# Patient Record
Sex: Male | Born: 1940
Health system: Southern US, Community
[De-identification: ages and names within clinical notes are randomized; demographics above are authoritative.]

## PROBLEM LIST (undated history)

## (undated) DIAGNOSIS — C801 Malignant (primary) neoplasm, unspecified: Secondary | ICD-10-CM

## (undated) DIAGNOSIS — D649 Anemia, unspecified: Secondary | ICD-10-CM

## (undated) DIAGNOSIS — N189 Chronic kidney disease, unspecified: Secondary | ICD-10-CM

## (undated) DIAGNOSIS — I509 Heart failure, unspecified: Secondary | ICD-10-CM

## (undated) DIAGNOSIS — J189 Pneumonia, unspecified organism: Secondary | ICD-10-CM

## (undated) DIAGNOSIS — R011 Cardiac murmur, unspecified: Secondary | ICD-10-CM

## (undated) DIAGNOSIS — C7A00 Malignant carcinoid tumor of unspecified site: Secondary | ICD-10-CM

## (undated) DIAGNOSIS — E785 Hyperlipidemia, unspecified: Secondary | ICD-10-CM

## (undated) DIAGNOSIS — I10 Essential (primary) hypertension: Secondary | ICD-10-CM

## (undated) DIAGNOSIS — R609 Edema, unspecified: Secondary | ICD-10-CM

## (undated) HISTORY — DX: Malignant (primary) neoplasm, unspecified: C80.1

## (undated) HISTORY — DX: Hyperlipidemia, unspecified: E78.5

## (undated) HISTORY — DX: Essential (primary) hypertension: I10

## (undated) HISTORY — DX: Chronic kidney disease, unspecified: N18.9

## (undated) HISTORY — DX: Anemia, unspecified: D64.9

## (undated) HISTORY — DX: Edema, unspecified: R60.9

## (undated) HISTORY — DX: Heart failure, unspecified: I50.9

## (undated) HISTORY — DX: Malignant carcinoid tumor of unspecified site: C7A.00

---

## 2008-07-17 HISTORY — PX: NEPHRECTOMY: SHX65

## 2009-02-08 ENCOUNTER — Ambulatory Visit: Payer: Self-pay | Admitting: Cardiology

## 2011-07-21 DIAGNOSIS — C7A093 Malignant carcinoid tumor of the kidney: Secondary | ICD-10-CM | POA: Diagnosis not present

## 2011-07-21 DIAGNOSIS — R972 Elevated prostate specific antigen [PSA]: Secondary | ICD-10-CM | POA: Diagnosis not present

## 2011-07-21 DIAGNOSIS — Z5111 Encounter for antineoplastic chemotherapy: Secondary | ICD-10-CM | POA: Diagnosis not present

## 2011-08-21 DIAGNOSIS — C7A093 Malignant carcinoid tumor of the kidney: Secondary | ICD-10-CM | POA: Diagnosis not present

## 2011-08-21 DIAGNOSIS — Z5111 Encounter for antineoplastic chemotherapy: Secondary | ICD-10-CM | POA: Diagnosis not present

## 2011-09-05 DIAGNOSIS — I10 Essential (primary) hypertension: Secondary | ICD-10-CM | POA: Diagnosis not present

## 2011-09-05 DIAGNOSIS — E782 Mixed hyperlipidemia: Secondary | ICD-10-CM | POA: Diagnosis not present

## 2011-09-12 DIAGNOSIS — R498 Other voice and resonance disorders: Secondary | ICD-10-CM | POA: Diagnosis not present

## 2011-09-12 DIAGNOSIS — C320 Malignant neoplasm of glottis: Secondary | ICD-10-CM | POA: Diagnosis not present

## 2011-10-06 DIAGNOSIS — H353190 Nonexudative age-related macular degeneration, unspecified eye, stage unspecified: Secondary | ICD-10-CM | POA: Diagnosis not present

## 2011-11-30 DIAGNOSIS — R498 Other voice and resonance disorders: Secondary | ICD-10-CM | POA: Diagnosis not present

## 2011-11-30 DIAGNOSIS — C320 Malignant neoplasm of glottis: Secondary | ICD-10-CM | POA: Diagnosis not present

## 2011-12-14 ENCOUNTER — Encounter: Payer: Managed Care, Other (non HMO) | Admitting: Internal Medicine

## 2011-12-14 DIAGNOSIS — C170 Malignant neoplasm of duodenum: Secondary | ICD-10-CM

## 2012-01-11 ENCOUNTER — Encounter: Payer: Managed Care, Other (non HMO) | Admitting: Hematology and Oncology

## 2012-01-11 DIAGNOSIS — C189 Malignant neoplasm of colon, unspecified: Secondary | ICD-10-CM | POA: Diagnosis not present

## 2012-01-25 DIAGNOSIS — R498 Other voice and resonance disorders: Secondary | ICD-10-CM | POA: Diagnosis not present

## 2012-01-25 DIAGNOSIS — C320 Malignant neoplasm of glottis: Secondary | ICD-10-CM | POA: Diagnosis not present

## 2012-02-08 ENCOUNTER — Encounter: Payer: Managed Care, Other (non HMO) | Admitting: Internal Medicine

## 2012-02-08 DIAGNOSIS — C7A098 Malignant carcinoid tumors of other sites: Secondary | ICD-10-CM | POA: Diagnosis not present

## 2012-03-07 ENCOUNTER — Encounter: Payer: Managed Care, Other (non HMO) | Admitting: Internal Medicine

## 2012-03-07 DIAGNOSIS — C787 Secondary malignant neoplasm of liver and intrahepatic bile duct: Secondary | ICD-10-CM | POA: Diagnosis not present

## 2012-03-07 DIAGNOSIS — C7A010 Malignant carcinoid tumor of the duodenum: Secondary | ICD-10-CM | POA: Diagnosis not present

## 2012-04-10 DIAGNOSIS — N183 Chronic kidney disease, stage 3 unspecified: Secondary | ICD-10-CM | POA: Diagnosis not present

## 2012-04-10 DIAGNOSIS — C787 Secondary malignant neoplasm of liver and intrahepatic bile duct: Secondary | ICD-10-CM | POA: Diagnosis not present

## 2012-04-10 DIAGNOSIS — C649 Malignant neoplasm of unspecified kidney, except renal pelvis: Secondary | ICD-10-CM | POA: Diagnosis not present

## 2012-04-11 ENCOUNTER — Encounter: Payer: Managed Care, Other (non HMO) | Admitting: Internal Medicine

## 2012-04-11 DIAGNOSIS — C787 Secondary malignant neoplasm of liver and intrahepatic bile duct: Secondary | ICD-10-CM

## 2012-04-11 DIAGNOSIS — N289 Disorder of kidney and ureter, unspecified: Secondary | ICD-10-CM

## 2012-04-11 DIAGNOSIS — C7A010 Malignant carcinoid tumor of the duodenum: Secondary | ICD-10-CM

## 2012-04-11 DIAGNOSIS — C649 Malignant neoplasm of unspecified kidney, except renal pelvis: Secondary | ICD-10-CM

## 2012-04-30 DIAGNOSIS — C320 Malignant neoplasm of glottis: Secondary | ICD-10-CM | POA: Diagnosis not present

## 2012-04-30 DIAGNOSIS — R498 Other voice and resonance disorders: Secondary | ICD-10-CM | POA: Diagnosis not present

## 2012-05-09 DIAGNOSIS — C7A010 Malignant carcinoid tumor of the duodenum: Secondary | ICD-10-CM

## 2012-05-09 DIAGNOSIS — C787 Secondary malignant neoplasm of liver and intrahepatic bile duct: Secondary | ICD-10-CM | POA: Diagnosis not present

## 2012-06-06 DIAGNOSIS — C787 Secondary malignant neoplasm of liver and intrahepatic bile duct: Secondary | ICD-10-CM

## 2012-06-06 DIAGNOSIS — C7A010 Malignant carcinoid tumor of the duodenum: Secondary | ICD-10-CM | POA: Diagnosis not present

## 2012-07-03 DIAGNOSIS — Z85528 Personal history of other malignant neoplasm of kidney: Secondary | ICD-10-CM | POA: Diagnosis not present

## 2012-07-03 DIAGNOSIS — C7A00 Malignant carcinoid tumor of unspecified site: Secondary | ICD-10-CM | POA: Diagnosis not present

## 2012-07-03 DIAGNOSIS — C649 Malignant neoplasm of unspecified kidney, except renal pelvis: Secondary | ICD-10-CM | POA: Diagnosis not present

## 2012-07-03 DIAGNOSIS — N281 Cyst of kidney, acquired: Secondary | ICD-10-CM | POA: Diagnosis not present

## 2012-07-04 DIAGNOSIS — C649 Malignant neoplasm of unspecified kidney, except renal pelvis: Secondary | ICD-10-CM | POA: Diagnosis not present

## 2012-07-04 DIAGNOSIS — I10 Essential (primary) hypertension: Secondary | ICD-10-CM | POA: Diagnosis not present

## 2012-07-04 DIAGNOSIS — C7A019 Malignant carcinoid tumor of the small intestine, unspecified portion: Secondary | ICD-10-CM | POA: Diagnosis not present

## 2012-07-04 DIAGNOSIS — C787 Secondary malignant neoplasm of liver and intrahepatic bile duct: Secondary | ICD-10-CM | POA: Diagnosis not present

## 2012-07-26 DIAGNOSIS — R498 Other voice and resonance disorders: Secondary | ICD-10-CM | POA: Diagnosis not present

## 2012-07-26 DIAGNOSIS — C320 Malignant neoplasm of glottis: Secondary | ICD-10-CM | POA: Diagnosis not present

## 2012-08-08 DIAGNOSIS — E340 Carcinoid syndrome: Secondary | ICD-10-CM

## 2012-08-08 DIAGNOSIS — C7A010 Malignant carcinoid tumor of the duodenum: Secondary | ICD-10-CM | POA: Diagnosis not present

## 2012-08-08 DIAGNOSIS — C787 Secondary malignant neoplasm of liver and intrahepatic bile duct: Secondary | ICD-10-CM | POA: Diagnosis not present

## 2012-09-05 DIAGNOSIS — C170 Malignant neoplasm of duodenum: Secondary | ICD-10-CM | POA: Diagnosis not present

## 2012-09-05 DIAGNOSIS — E340 Carcinoid syndrome: Secondary | ICD-10-CM | POA: Diagnosis not present

## 2012-09-05 DIAGNOSIS — I10 Essential (primary) hypertension: Secondary | ICD-10-CM | POA: Diagnosis not present

## 2012-09-27 DIAGNOSIS — C7A019 Malignant carcinoid tumor of the small intestine, unspecified portion: Secondary | ICD-10-CM | POA: Diagnosis not present

## 2012-09-27 DIAGNOSIS — C787 Secondary malignant neoplasm of liver and intrahepatic bile duct: Secondary | ICD-10-CM | POA: Diagnosis not present

## 2012-09-27 DIAGNOSIS — I10 Essential (primary) hypertension: Secondary | ICD-10-CM | POA: Diagnosis not present

## 2012-09-27 DIAGNOSIS — C649 Malignant neoplasm of unspecified kidney, except renal pelvis: Secondary | ICD-10-CM | POA: Diagnosis not present

## 2012-09-27 DIAGNOSIS — Z905 Acquired absence of kidney: Secondary | ICD-10-CM | POA: Diagnosis not present

## 2012-09-27 DIAGNOSIS — E785 Hyperlipidemia, unspecified: Secondary | ICD-10-CM | POA: Diagnosis not present

## 2012-10-03 DIAGNOSIS — C787 Secondary malignant neoplasm of liver and intrahepatic bile duct: Secondary | ICD-10-CM | POA: Diagnosis not present

## 2012-10-03 DIAGNOSIS — C7A019 Malignant carcinoid tumor of the small intestine, unspecified portion: Secondary | ICD-10-CM | POA: Diagnosis not present

## 2012-10-03 DIAGNOSIS — E3400 Carcinoid syndrome, unspecified: Secondary | ICD-10-CM

## 2012-10-25 DIAGNOSIS — R498 Other voice and resonance disorders: Secondary | ICD-10-CM | POA: Diagnosis not present

## 2012-10-25 DIAGNOSIS — C320 Malignant neoplasm of glottis: Secondary | ICD-10-CM | POA: Diagnosis not present

## 2012-10-31 DIAGNOSIS — R197 Diarrhea, unspecified: Secondary | ICD-10-CM

## 2012-10-31 DIAGNOSIS — C170 Malignant neoplasm of duodenum: Secondary | ICD-10-CM

## 2012-11-28 DIAGNOSIS — C170 Malignant neoplasm of duodenum: Secondary | ICD-10-CM

## 2012-12-19 DIAGNOSIS — Z905 Acquired absence of kidney: Secondary | ICD-10-CM | POA: Diagnosis not present

## 2012-12-19 DIAGNOSIS — E785 Hyperlipidemia, unspecified: Secondary | ICD-10-CM | POA: Diagnosis not present

## 2012-12-19 DIAGNOSIS — C787 Secondary malignant neoplasm of liver and intrahepatic bile duct: Secondary | ICD-10-CM | POA: Diagnosis not present

## 2012-12-19 DIAGNOSIS — C7A019 Malignant carcinoid tumor of the small intestine, unspecified portion: Secondary | ICD-10-CM | POA: Diagnosis not present

## 2012-12-19 DIAGNOSIS — I10 Essential (primary) hypertension: Secondary | ICD-10-CM | POA: Diagnosis not present

## 2012-12-19 DIAGNOSIS — C649 Malignant neoplasm of unspecified kidney, except renal pelvis: Secondary | ICD-10-CM | POA: Diagnosis not present

## 2012-12-20 DIAGNOSIS — I10 Essential (primary) hypertension: Secondary | ICD-10-CM | POA: Diagnosis not present

## 2012-12-20 DIAGNOSIS — E785 Hyperlipidemia, unspecified: Secondary | ICD-10-CM | POA: Diagnosis not present

## 2012-12-20 DIAGNOSIS — C649 Malignant neoplasm of unspecified kidney, except renal pelvis: Secondary | ICD-10-CM | POA: Diagnosis not present

## 2012-12-20 DIAGNOSIS — C7A019 Malignant carcinoid tumor of the small intestine, unspecified portion: Secondary | ICD-10-CM | POA: Diagnosis not present

## 2012-12-20 DIAGNOSIS — C787 Secondary malignant neoplasm of liver and intrahepatic bile duct: Secondary | ICD-10-CM | POA: Diagnosis not present

## 2012-12-20 DIAGNOSIS — Z905 Acquired absence of kidney: Secondary | ICD-10-CM | POA: Diagnosis not present

## 2012-12-26 ENCOUNTER — Encounter: Payer: Managed Care, Other (non HMO) | Admitting: Internal Medicine

## 2012-12-26 DIAGNOSIS — C7B8 Other secondary neuroendocrine tumors: Secondary | ICD-10-CM

## 2012-12-26 DIAGNOSIS — C7A019 Malignant carcinoid tumor of the small intestine, unspecified portion: Secondary | ICD-10-CM

## 2012-12-26 DIAGNOSIS — C787 Secondary malignant neoplasm of liver and intrahepatic bile duct: Secondary | ICD-10-CM

## 2012-12-26 DIAGNOSIS — Z85528 Personal history of other malignant neoplasm of kidney: Secondary | ICD-10-CM

## 2013-01-21 DIAGNOSIS — H353190 Nonexudative age-related macular degeneration, unspecified eye, stage unspecified: Secondary | ICD-10-CM | POA: Diagnosis not present

## 2013-01-23 DIAGNOSIS — E340 Carcinoid syndrome: Secondary | ICD-10-CM

## 2013-01-28 DIAGNOSIS — C320 Malignant neoplasm of glottis: Secondary | ICD-10-CM | POA: Diagnosis not present

## 2013-01-28 DIAGNOSIS — R498 Other voice and resonance disorders: Secondary | ICD-10-CM | POA: Diagnosis not present

## 2013-01-31 DIAGNOSIS — R0602 Shortness of breath: Secondary | ICD-10-CM | POA: Diagnosis not present

## 2013-02-05 DIAGNOSIS — R609 Edema, unspecified: Secondary | ICD-10-CM | POA: Diagnosis not present

## 2013-02-05 DIAGNOSIS — R799 Abnormal finding of blood chemistry, unspecified: Secondary | ICD-10-CM | POA: Diagnosis not present

## 2013-02-20 DIAGNOSIS — E340 Carcinoid syndrome: Secondary | ICD-10-CM

## 2013-03-11 DIAGNOSIS — E782 Mixed hyperlipidemia: Secondary | ICD-10-CM | POA: Diagnosis not present

## 2013-03-20 DIAGNOSIS — E340 Carcinoid syndrome: Secondary | ICD-10-CM

## 2013-03-20 DIAGNOSIS — C787 Secondary malignant neoplasm of liver and intrahepatic bile duct: Secondary | ICD-10-CM

## 2013-03-20 DIAGNOSIS — C7A019 Malignant carcinoid tumor of the small intestine, unspecified portion: Secondary | ICD-10-CM

## 2013-04-17 DIAGNOSIS — C7A019 Malignant carcinoid tumor of the small intestine, unspecified portion: Secondary | ICD-10-CM

## 2013-04-17 DIAGNOSIS — C787 Secondary malignant neoplasm of liver and intrahepatic bile duct: Secondary | ICD-10-CM

## 2013-04-17 DIAGNOSIS — C7B8 Other secondary neuroendocrine tumors: Secondary | ICD-10-CM

## 2013-04-17 DIAGNOSIS — E340 Carcinoid syndrome: Secondary | ICD-10-CM

## 2013-04-30 DIAGNOSIS — R609 Edema, unspecified: Secondary | ICD-10-CM | POA: Diagnosis not present

## 2013-04-30 DIAGNOSIS — M79609 Pain in unspecified limb: Secondary | ICD-10-CM | POA: Diagnosis not present

## 2013-04-30 DIAGNOSIS — B351 Tinea unguium: Secondary | ICD-10-CM | POA: Diagnosis not present

## 2013-05-13 DIAGNOSIS — R609 Edema, unspecified: Secondary | ICD-10-CM | POA: Diagnosis not present

## 2013-05-13 DIAGNOSIS — C7A019 Malignant carcinoid tumor of the small intestine, unspecified portion: Secondary | ICD-10-CM

## 2013-05-13 DIAGNOSIS — N289 Disorder of kidney and ureter, unspecified: Secondary | ICD-10-CM | POA: Diagnosis not present

## 2013-05-13 DIAGNOSIS — R972 Elevated prostate specific antigen [PSA]: Secondary | ICD-10-CM | POA: Diagnosis not present

## 2013-05-13 DIAGNOSIS — C787 Secondary malignant neoplasm of liver and intrahepatic bile duct: Secondary | ICD-10-CM

## 2013-05-13 DIAGNOSIS — C760 Malignant neoplasm of head, face and neck: Secondary | ICD-10-CM | POA: Diagnosis not present

## 2013-05-13 DIAGNOSIS — C649 Malignant neoplasm of unspecified kidney, except renal pelvis: Secondary | ICD-10-CM | POA: Diagnosis not present

## 2013-05-15 ENCOUNTER — Other Ambulatory Visit: Payer: Self-pay | Admitting: *Deleted

## 2013-05-15 DIAGNOSIS — C649 Malignant neoplasm of unspecified kidney, except renal pelvis: Secondary | ICD-10-CM | POA: Diagnosis not present

## 2013-05-15 DIAGNOSIS — N289 Disorder of kidney and ureter, unspecified: Secondary | ICD-10-CM | POA: Diagnosis not present

## 2013-05-15 DIAGNOSIS — R972 Elevated prostate specific antigen [PSA]: Secondary | ICD-10-CM | POA: Diagnosis not present

## 2013-05-15 DIAGNOSIS — R609 Edema, unspecified: Secondary | ICD-10-CM | POA: Diagnosis not present

## 2013-05-15 DIAGNOSIS — M7989 Other specified soft tissue disorders: Secondary | ICD-10-CM

## 2013-05-15 DIAGNOSIS — C760 Malignant neoplasm of head, face and neck: Secondary | ICD-10-CM | POA: Diagnosis not present

## 2013-05-22 ENCOUNTER — Encounter: Payer: Self-pay | Admitting: Vascular Surgery

## 2013-05-28 DIAGNOSIS — K449 Diaphragmatic hernia without obstruction or gangrene: Secondary | ICD-10-CM | POA: Insufficient documentation

## 2013-05-28 DIAGNOSIS — I10 Essential (primary) hypertension: Secondary | ICD-10-CM | POA: Insufficient documentation

## 2013-05-28 DIAGNOSIS — C649 Malignant neoplasm of unspecified kidney, except renal pelvis: Secondary | ICD-10-CM | POA: Insufficient documentation

## 2013-05-28 DIAGNOSIS — R195 Other fecal abnormalities: Secondary | ICD-10-CM | POA: Insufficient documentation

## 2013-05-28 DIAGNOSIS — D509 Iron deficiency anemia, unspecified: Secondary | ICD-10-CM | POA: Insufficient documentation

## 2013-05-28 DIAGNOSIS — E7800 Pure hypercholesterolemia, unspecified: Secondary | ICD-10-CM | POA: Insufficient documentation

## 2013-05-28 DIAGNOSIS — C179 Malignant neoplasm of small intestine, unspecified: Secondary | ICD-10-CM | POA: Insufficient documentation

## 2013-05-28 DIAGNOSIS — C760 Malignant neoplasm of head, face and neck: Secondary | ICD-10-CM | POA: Insufficient documentation

## 2013-06-02 DIAGNOSIS — C320 Malignant neoplasm of glottis: Secondary | ICD-10-CM | POA: Diagnosis not present

## 2013-06-02 DIAGNOSIS — R498 Other voice and resonance disorders: Secondary | ICD-10-CM | POA: Diagnosis not present

## 2013-06-03 DIAGNOSIS — Z8589 Personal history of malignant neoplasm of other organs and systems: Secondary | ICD-10-CM

## 2013-06-03 DIAGNOSIS — C170 Malignant neoplasm of duodenum: Secondary | ICD-10-CM

## 2013-06-03 DIAGNOSIS — D3A093 Benign carcinoid tumor of the kidney: Secondary | ICD-10-CM

## 2013-06-03 DIAGNOSIS — R944 Abnormal results of kidney function studies: Secondary | ICD-10-CM

## 2013-06-03 DIAGNOSIS — R972 Elevated prostate specific antigen [PSA]: Secondary | ICD-10-CM

## 2013-06-03 DIAGNOSIS — R609 Edema, unspecified: Secondary | ICD-10-CM

## 2013-06-17 DIAGNOSIS — E7800 Pure hypercholesterolemia, unspecified: Secondary | ICD-10-CM | POA: Diagnosis not present

## 2013-06-17 DIAGNOSIS — J45909 Unspecified asthma, uncomplicated: Secondary | ICD-10-CM | POA: Diagnosis not present

## 2013-06-17 DIAGNOSIS — Z7982 Long term (current) use of aspirin: Secondary | ICD-10-CM | POA: Diagnosis not present

## 2013-06-17 DIAGNOSIS — Z905 Acquired absence of kidney: Secondary | ICD-10-CM | POA: Diagnosis not present

## 2013-06-17 DIAGNOSIS — Z85528 Personal history of other malignant neoplasm of kidney: Secondary | ICD-10-CM | POA: Diagnosis not present

## 2013-06-17 DIAGNOSIS — I10 Essential (primary) hypertension: Secondary | ICD-10-CM | POA: Diagnosis not present

## 2013-06-17 DIAGNOSIS — J159 Unspecified bacterial pneumonia: Secondary | ICD-10-CM | POA: Diagnosis not present

## 2013-06-17 DIAGNOSIS — Z79899 Other long term (current) drug therapy: Secondary | ICD-10-CM | POA: Diagnosis not present

## 2013-06-19 ENCOUNTER — Encounter: Payer: Self-pay | Admitting: Vascular Surgery

## 2013-06-20 ENCOUNTER — Inpatient Hospital Stay (HOSPITAL_COMMUNITY): Admission: RE | Admit: 2013-06-20 | Payer: Managed Care, Other (non HMO) | Source: Ambulatory Visit

## 2013-06-20 ENCOUNTER — Encounter: Payer: Managed Care, Other (non HMO) | Admitting: Vascular Surgery

## 2013-06-23 DIAGNOSIS — Z8589 Personal history of malignant neoplasm of other organs and systems: Secondary | ICD-10-CM

## 2013-06-23 DIAGNOSIS — D3A00 Benign carcinoid tumor of unspecified site: Secondary | ICD-10-CM | POA: Diagnosis not present

## 2013-06-23 DIAGNOSIS — R319 Hematuria, unspecified: Secondary | ICD-10-CM | POA: Diagnosis not present

## 2013-06-23 DIAGNOSIS — C7A00 Malignant carcinoid tumor of unspecified site: Secondary | ICD-10-CM

## 2013-06-23 DIAGNOSIS — I129 Hypertensive chronic kidney disease with stage 1 through stage 4 chronic kidney disease, or unspecified chronic kidney disease: Secondary | ICD-10-CM | POA: Diagnosis not present

## 2013-06-23 DIAGNOSIS — C760 Malignant neoplasm of head, face and neck: Secondary | ICD-10-CM | POA: Diagnosis not present

## 2013-06-23 DIAGNOSIS — N189 Chronic kidney disease, unspecified: Secondary | ICD-10-CM | POA: Diagnosis not present

## 2013-06-23 DIAGNOSIS — C787 Secondary malignant neoplasm of liver and intrahepatic bile duct: Secondary | ICD-10-CM

## 2013-06-23 DIAGNOSIS — C649 Malignant neoplasm of unspecified kidney, except renal pelvis: Secondary | ICD-10-CM | POA: Diagnosis not present

## 2013-06-23 DIAGNOSIS — R972 Elevated prostate specific antigen [PSA]: Secondary | ICD-10-CM | POA: Diagnosis not present

## 2013-06-25 DIAGNOSIS — R972 Elevated prostate specific antigen [PSA]: Secondary | ICD-10-CM | POA: Diagnosis not present

## 2013-06-25 DIAGNOSIS — D3A00 Benign carcinoid tumor of unspecified site: Secondary | ICD-10-CM | POA: Diagnosis not present

## 2013-06-25 DIAGNOSIS — C760 Malignant neoplasm of head, face and neck: Secondary | ICD-10-CM | POA: Diagnosis not present

## 2013-06-25 DIAGNOSIS — N189 Chronic kidney disease, unspecified: Secondary | ICD-10-CM | POA: Diagnosis not present

## 2013-06-25 DIAGNOSIS — C649 Malignant neoplasm of unspecified kidney, except renal pelvis: Secondary | ICD-10-CM | POA: Diagnosis not present

## 2013-06-25 DIAGNOSIS — I129 Hypertensive chronic kidney disease with stage 1 through stage 4 chronic kidney disease, or unspecified chronic kidney disease: Secondary | ICD-10-CM | POA: Diagnosis not present

## 2013-07-08 DIAGNOSIS — Q602 Renal agenesis, unspecified: Secondary | ICD-10-CM | POA: Diagnosis not present

## 2013-07-08 DIAGNOSIS — R972 Elevated prostate specific antigen [PSA]: Secondary | ICD-10-CM | POA: Diagnosis not present

## 2013-08-07 DIAGNOSIS — N184 Chronic kidney disease, stage 4 (severe): Secondary | ICD-10-CM | POA: Diagnosis not present

## 2013-08-07 DIAGNOSIS — D3A00 Benign carcinoid tumor of unspecified site: Secondary | ICD-10-CM | POA: Diagnosis not present

## 2013-08-07 DIAGNOSIS — C649 Malignant neoplasm of unspecified kidney, except renal pelvis: Secondary | ICD-10-CM | POA: Diagnosis not present

## 2013-08-07 DIAGNOSIS — Z85528 Personal history of other malignant neoplasm of kidney: Secondary | ICD-10-CM | POA: Diagnosis not present

## 2013-08-07 DIAGNOSIS — Z8589 Personal history of malignant neoplasm of other organs and systems: Secondary | ICD-10-CM | POA: Diagnosis not present

## 2013-08-07 DIAGNOSIS — C260 Malignant neoplasm of intestinal tract, part unspecified: Secondary | ICD-10-CM | POA: Diagnosis not present

## 2013-08-07 DIAGNOSIS — C179 Malignant neoplasm of small intestine, unspecified: Secondary | ICD-10-CM | POA: Diagnosis not present

## 2013-08-13 DIAGNOSIS — Z131 Encounter for screening for diabetes mellitus: Secondary | ICD-10-CM | POA: Diagnosis not present

## 2013-08-13 DIAGNOSIS — I10 Essential (primary) hypertension: Secondary | ICD-10-CM | POA: Diagnosis not present

## 2013-08-29 DIAGNOSIS — I059 Rheumatic mitral valve disease, unspecified: Secondary | ICD-10-CM | POA: Diagnosis not present

## 2013-08-29 DIAGNOSIS — J90 Pleural effusion, not elsewhere classified: Secondary | ICD-10-CM | POA: Diagnosis not present

## 2013-08-29 DIAGNOSIS — Z905 Acquired absence of kidney: Secondary | ICD-10-CM | POA: Diagnosis not present

## 2013-08-29 DIAGNOSIS — R609 Edema, unspecified: Secondary | ICD-10-CM | POA: Diagnosis not present

## 2013-08-29 DIAGNOSIS — Z79899 Other long term (current) drug therapy: Secondary | ICD-10-CM | POA: Diagnosis not present

## 2013-08-29 DIAGNOSIS — E876 Hypokalemia: Secondary | ICD-10-CM | POA: Diagnosis not present

## 2013-08-29 DIAGNOSIS — C7A00 Malignant carcinoid tumor of unspecified site: Secondary | ICD-10-CM | POA: Diagnosis not present

## 2013-08-29 DIAGNOSIS — N184 Chronic kidney disease, stage 4 (severe): Secondary | ICD-10-CM | POA: Diagnosis not present

## 2013-08-29 DIAGNOSIS — E7800 Pure hypercholesterolemia, unspecified: Secondary | ICD-10-CM | POA: Diagnosis present

## 2013-08-29 DIAGNOSIS — I5031 Acute diastolic (congestive) heart failure: Secondary | ICD-10-CM | POA: Diagnosis not present

## 2013-08-29 DIAGNOSIS — N289 Disorder of kidney and ureter, unspecified: Secondary | ICD-10-CM | POA: Diagnosis not present

## 2013-08-29 DIAGNOSIS — I517 Cardiomegaly: Secondary | ICD-10-CM | POA: Diagnosis not present

## 2013-08-29 DIAGNOSIS — I509 Heart failure, unspecified: Secondary | ICD-10-CM | POA: Diagnosis not present

## 2013-08-29 DIAGNOSIS — Z85828 Personal history of other malignant neoplasm of skin: Secondary | ICD-10-CM | POA: Diagnosis not present

## 2013-08-29 DIAGNOSIS — E785 Hyperlipidemia, unspecified: Secondary | ICD-10-CM | POA: Diagnosis present

## 2013-08-29 DIAGNOSIS — Z85528 Personal history of other malignant neoplasm of kidney: Secondary | ICD-10-CM | POA: Diagnosis not present

## 2013-08-29 DIAGNOSIS — R635 Abnormal weight gain: Secondary | ICD-10-CM | POA: Diagnosis not present

## 2013-08-29 DIAGNOSIS — C649 Malignant neoplasm of unspecified kidney, except renal pelvis: Secondary | ICD-10-CM | POA: Diagnosis not present

## 2013-08-29 DIAGNOSIS — C787 Secondary malignant neoplasm of liver and intrahepatic bile duct: Secondary | ICD-10-CM | POA: Diagnosis not present

## 2013-09-04 DIAGNOSIS — C260 Malignant neoplasm of intestinal tract, part unspecified: Secondary | ICD-10-CM | POA: Diagnosis not present

## 2013-09-08 DIAGNOSIS — I509 Heart failure, unspecified: Secondary | ICD-10-CM | POA: Diagnosis not present

## 2013-09-08 DIAGNOSIS — Z6827 Body mass index (BMI) 27.0-27.9, adult: Secondary | ICD-10-CM | POA: Diagnosis not present

## 2013-09-23 DIAGNOSIS — N289 Disorder of kidney and ureter, unspecified: Secondary | ICD-10-CM | POA: Diagnosis not present

## 2013-09-23 DIAGNOSIS — N281 Cyst of kidney, acquired: Secondary | ICD-10-CM | POA: Diagnosis not present

## 2013-09-23 DIAGNOSIS — Z905 Acquired absence of kidney: Secondary | ICD-10-CM | POA: Diagnosis not present

## 2013-09-23 DIAGNOSIS — J90 Pleural effusion, not elsewhere classified: Secondary | ICD-10-CM | POA: Diagnosis not present

## 2013-10-02 DIAGNOSIS — C649 Malignant neoplasm of unspecified kidney, except renal pelvis: Secondary | ICD-10-CM | POA: Diagnosis not present

## 2013-10-02 DIAGNOSIS — D3A00 Benign carcinoid tumor of unspecified site: Secondary | ICD-10-CM | POA: Diagnosis not present

## 2013-10-02 DIAGNOSIS — C179 Malignant neoplasm of small intestine, unspecified: Secondary | ICD-10-CM | POA: Diagnosis not present

## 2013-10-07 DIAGNOSIS — C320 Malignant neoplasm of glottis: Secondary | ICD-10-CM | POA: Diagnosis not present

## 2013-10-07 DIAGNOSIS — R498 Other voice and resonance disorders: Secondary | ICD-10-CM | POA: Diagnosis not present

## 2013-10-30 DIAGNOSIS — C179 Malignant neoplasm of small intestine, unspecified: Secondary | ICD-10-CM | POA: Diagnosis not present

## 2013-10-30 DIAGNOSIS — C649 Malignant neoplasm of unspecified kidney, except renal pelvis: Secondary | ICD-10-CM | POA: Diagnosis not present

## 2013-11-04 DIAGNOSIS — E872 Acidosis, unspecified: Secondary | ICD-10-CM | POA: Diagnosis not present

## 2013-11-04 DIAGNOSIS — N184 Chronic kidney disease, stage 4 (severe): Secondary | ICD-10-CM | POA: Diagnosis not present

## 2013-11-04 DIAGNOSIS — E559 Vitamin D deficiency, unspecified: Secondary | ICD-10-CM | POA: Diagnosis not present

## 2013-11-04 DIAGNOSIS — R809 Proteinuria, unspecified: Secondary | ICD-10-CM | POA: Diagnosis not present

## 2013-11-04 DIAGNOSIS — E876 Hypokalemia: Secondary | ICD-10-CM | POA: Diagnosis not present

## 2013-11-04 DIAGNOSIS — I509 Heart failure, unspecified: Secondary | ICD-10-CM | POA: Diagnosis not present

## 2013-11-04 DIAGNOSIS — E871 Hypo-osmolality and hyponatremia: Secondary | ICD-10-CM | POA: Diagnosis not present

## 2013-11-27 DIAGNOSIS — C179 Malignant neoplasm of small intestine, unspecified: Secondary | ICD-10-CM | POA: Diagnosis not present

## 2013-11-27 DIAGNOSIS — Z5111 Encounter for antineoplastic chemotherapy: Secondary | ICD-10-CM | POA: Diagnosis not present

## 2013-11-27 DIAGNOSIS — R197 Diarrhea, unspecified: Secondary | ICD-10-CM | POA: Diagnosis not present

## 2013-12-04 DIAGNOSIS — N281 Cyst of kidney, acquired: Secondary | ICD-10-CM | POA: Diagnosis not present

## 2013-12-04 DIAGNOSIS — E559 Vitamin D deficiency, unspecified: Secondary | ICD-10-CM | POA: Diagnosis not present

## 2013-12-04 DIAGNOSIS — R809 Proteinuria, unspecified: Secondary | ICD-10-CM | POA: Diagnosis not present

## 2013-12-04 DIAGNOSIS — N189 Chronic kidney disease, unspecified: Secondary | ICD-10-CM | POA: Diagnosis not present

## 2013-12-04 DIAGNOSIS — Z905 Acquired absence of kidney: Secondary | ICD-10-CM | POA: Diagnosis not present

## 2013-12-04 DIAGNOSIS — D649 Anemia, unspecified: Secondary | ICD-10-CM | POA: Diagnosis not present

## 2013-12-04 DIAGNOSIS — Z79899 Other long term (current) drug therapy: Secondary | ICD-10-CM | POA: Diagnosis not present

## 2013-12-04 DIAGNOSIS — I129 Hypertensive chronic kidney disease with stage 1 through stage 4 chronic kidney disease, or unspecified chronic kidney disease: Secondary | ICD-10-CM | POA: Diagnosis not present

## 2013-12-06 DIAGNOSIS — E876 Hypokalemia: Secondary | ICD-10-CM | POA: Diagnosis not present

## 2013-12-24 DIAGNOSIS — D3A019 Benign carcinoid tumor of the small intestine, unspecified portion: Secondary | ICD-10-CM | POA: Diagnosis not present

## 2013-12-24 DIAGNOSIS — C649 Malignant neoplasm of unspecified kidney, except renal pelvis: Secondary | ICD-10-CM | POA: Diagnosis not present

## 2013-12-24 DIAGNOSIS — Z905 Acquired absence of kidney: Secondary | ICD-10-CM | POA: Diagnosis not present

## 2013-12-24 DIAGNOSIS — C179 Malignant neoplasm of small intestine, unspecified: Secondary | ICD-10-CM | POA: Diagnosis not present

## 2013-12-25 DIAGNOSIS — Z905 Acquired absence of kidney: Secondary | ICD-10-CM | POA: Diagnosis not present

## 2013-12-25 DIAGNOSIS — Z803 Family history of malignant neoplasm of breast: Secondary | ICD-10-CM | POA: Diagnosis not present

## 2013-12-25 DIAGNOSIS — Z85528 Personal history of other malignant neoplasm of kidney: Secondary | ICD-10-CM | POA: Diagnosis not present

## 2013-12-25 DIAGNOSIS — D3A00 Benign carcinoid tumor of unspecified site: Secondary | ICD-10-CM | POA: Diagnosis not present

## 2013-12-25 DIAGNOSIS — E785 Hyperlipidemia, unspecified: Secondary | ICD-10-CM | POA: Diagnosis not present

## 2013-12-25 DIAGNOSIS — I5032 Chronic diastolic (congestive) heart failure: Secondary | ICD-10-CM | POA: Diagnosis not present

## 2013-12-25 DIAGNOSIS — R51 Headache: Secondary | ICD-10-CM | POA: Diagnosis not present

## 2013-12-25 DIAGNOSIS — Z823 Family history of stroke: Secondary | ICD-10-CM | POA: Diagnosis not present

## 2013-12-25 DIAGNOSIS — I120 Hypertensive chronic kidney disease with stage 5 chronic kidney disease or end stage renal disease: Secondary | ICD-10-CM | POA: Diagnosis not present

## 2013-12-25 DIAGNOSIS — Z8589 Personal history of malignant neoplasm of other organs and systems: Secondary | ICD-10-CM | POA: Diagnosis not present

## 2013-12-25 DIAGNOSIS — I509 Heart failure, unspecified: Secondary | ICD-10-CM | POA: Diagnosis not present

## 2013-12-25 DIAGNOSIS — D3A098 Benign carcinoid tumors of other sites: Secondary | ICD-10-CM | POA: Diagnosis not present

## 2013-12-25 DIAGNOSIS — Z79899 Other long term (current) drug therapy: Secondary | ICD-10-CM | POA: Diagnosis not present

## 2013-12-25 DIAGNOSIS — N185 Chronic kidney disease, stage 5: Secondary | ICD-10-CM | POA: Diagnosis not present

## 2013-12-25 DIAGNOSIS — C787 Secondary malignant neoplasm of liver and intrahepatic bile duct: Secondary | ICD-10-CM | POA: Diagnosis not present

## 2013-12-30 DIAGNOSIS — R809 Proteinuria, unspecified: Secondary | ICD-10-CM | POA: Diagnosis not present

## 2013-12-30 DIAGNOSIS — N184 Chronic kidney disease, stage 4 (severe): Secondary | ICD-10-CM | POA: Diagnosis not present

## 2013-12-30 DIAGNOSIS — E559 Vitamin D deficiency, unspecified: Secondary | ICD-10-CM | POA: Diagnosis not present

## 2013-12-30 DIAGNOSIS — I509 Heart failure, unspecified: Secondary | ICD-10-CM | POA: Diagnosis not present

## 2013-12-30 DIAGNOSIS — E876 Hypokalemia: Secondary | ICD-10-CM | POA: Diagnosis not present

## 2013-12-30 DIAGNOSIS — E872 Acidosis, unspecified: Secondary | ICD-10-CM | POA: Diagnosis not present

## 2013-12-30 DIAGNOSIS — E871 Hypo-osmolality and hyponatremia: Secondary | ICD-10-CM | POA: Diagnosis not present

## 2014-01-14 DIAGNOSIS — N189 Chronic kidney disease, unspecified: Secondary | ICD-10-CM | POA: Diagnosis not present

## 2014-01-14 DIAGNOSIS — I129 Hypertensive chronic kidney disease with stage 1 through stage 4 chronic kidney disease, or unspecified chronic kidney disease: Secondary | ICD-10-CM | POA: Diagnosis not present

## 2014-01-14 DIAGNOSIS — Z79899 Other long term (current) drug therapy: Secondary | ICD-10-CM | POA: Diagnosis not present

## 2014-01-21 DIAGNOSIS — D631 Anemia in chronic kidney disease: Secondary | ICD-10-CM | POA: Diagnosis not present

## 2014-01-21 DIAGNOSIS — D511 Vitamin B12 deficiency anemia due to selective vitamin B12 malabsorption with proteinuria: Secondary | ICD-10-CM | POA: Insufficient documentation

## 2014-01-21 DIAGNOSIS — N189 Chronic kidney disease, unspecified: Secondary | ICD-10-CM

## 2014-01-21 DIAGNOSIS — C649 Malignant neoplasm of unspecified kidney, except renal pelvis: Secondary | ICD-10-CM | POA: Diagnosis not present

## 2014-01-21 DIAGNOSIS — R197 Diarrhea, unspecified: Secondary | ICD-10-CM | POA: Diagnosis not present

## 2014-01-21 DIAGNOSIS — Z5111 Encounter for antineoplastic chemotherapy: Secondary | ICD-10-CM | POA: Diagnosis not present

## 2014-01-21 DIAGNOSIS — D3A00 Benign carcinoid tumor of unspecified site: Secondary | ICD-10-CM | POA: Insufficient documentation

## 2014-01-21 DIAGNOSIS — C641 Malignant neoplasm of right kidney, except renal pelvis: Secondary | ICD-10-CM | POA: Insufficient documentation

## 2014-01-21 DIAGNOSIS — D518 Other vitamin B12 deficiency anemias: Secondary | ICD-10-CM | POA: Diagnosis not present

## 2014-01-21 DIAGNOSIS — C179 Malignant neoplasm of small intestine, unspecified: Secondary | ICD-10-CM | POA: Diagnosis not present

## 2014-02-10 DIAGNOSIS — C61 Malignant neoplasm of prostate: Secondary | ICD-10-CM | POA: Diagnosis not present

## 2014-02-19 DIAGNOSIS — C649 Malignant neoplasm of unspecified kidney, except renal pelvis: Secondary | ICD-10-CM | POA: Diagnosis not present

## 2014-02-19 DIAGNOSIS — R197 Diarrhea, unspecified: Secondary | ICD-10-CM | POA: Diagnosis not present

## 2014-02-21 DIAGNOSIS — Z79899 Other long term (current) drug therapy: Secondary | ICD-10-CM | POA: Diagnosis not present

## 2014-02-21 DIAGNOSIS — T6391XA Toxic effect of contact with unspecified venomous animal, accidental (unintentional), initial encounter: Secondary | ICD-10-CM | POA: Diagnosis not present

## 2014-02-21 DIAGNOSIS — I10 Essential (primary) hypertension: Secondary | ICD-10-CM | POA: Diagnosis not present

## 2014-02-21 DIAGNOSIS — Z85528 Personal history of other malignant neoplasm of kidney: Secondary | ICD-10-CM | POA: Diagnosis not present

## 2014-02-21 DIAGNOSIS — E7800 Pure hypercholesterolemia, unspecified: Secondary | ICD-10-CM | POA: Diagnosis not present

## 2014-03-17 DIAGNOSIS — D649 Anemia, unspecified: Secondary | ICD-10-CM | POA: Diagnosis not present

## 2014-03-17 DIAGNOSIS — R809 Proteinuria, unspecified: Secondary | ICD-10-CM | POA: Diagnosis not present

## 2014-03-17 DIAGNOSIS — Z79899 Other long term (current) drug therapy: Secondary | ICD-10-CM | POA: Diagnosis not present

## 2014-03-17 DIAGNOSIS — E559 Vitamin D deficiency, unspecified: Secondary | ICD-10-CM | POA: Diagnosis not present

## 2014-03-17 DIAGNOSIS — I129 Hypertensive chronic kidney disease with stage 1 through stage 4 chronic kidney disease, or unspecified chronic kidney disease: Secondary | ICD-10-CM | POA: Diagnosis not present

## 2014-03-17 DIAGNOSIS — N189 Chronic kidney disease, unspecified: Secondary | ICD-10-CM | POA: Diagnosis not present

## 2014-03-19 DIAGNOSIS — D3A00 Benign carcinoid tumor of unspecified site: Secondary | ICD-10-CM | POA: Diagnosis not present

## 2014-03-19 DIAGNOSIS — R197 Diarrhea, unspecified: Secondary | ICD-10-CM | POA: Diagnosis not present

## 2014-03-19 DIAGNOSIS — C179 Malignant neoplasm of small intestine, unspecified: Secondary | ICD-10-CM | POA: Diagnosis not present

## 2014-03-19 DIAGNOSIS — C649 Malignant neoplasm of unspecified kidney, except renal pelvis: Secondary | ICD-10-CM | POA: Diagnosis not present

## 2014-03-24 DIAGNOSIS — C320 Malignant neoplasm of glottis: Secondary | ICD-10-CM | POA: Diagnosis not present

## 2014-03-24 DIAGNOSIS — R498 Other voice and resonance disorders: Secondary | ICD-10-CM | POA: Diagnosis not present

## 2014-03-31 DIAGNOSIS — E872 Acidosis, unspecified: Secondary | ICD-10-CM | POA: Diagnosis not present

## 2014-03-31 DIAGNOSIS — N184 Chronic kidney disease, stage 4 (severe): Secondary | ICD-10-CM | POA: Diagnosis not present

## 2014-03-31 DIAGNOSIS — E871 Hypo-osmolality and hyponatremia: Secondary | ICD-10-CM | POA: Diagnosis not present

## 2014-03-31 DIAGNOSIS — E559 Vitamin D deficiency, unspecified: Secondary | ICD-10-CM | POA: Diagnosis not present

## 2014-03-31 DIAGNOSIS — I509 Heart failure, unspecified: Secondary | ICD-10-CM | POA: Diagnosis not present

## 2014-03-31 DIAGNOSIS — E876 Hypokalemia: Secondary | ICD-10-CM | POA: Diagnosis not present

## 2014-03-31 DIAGNOSIS — I10 Essential (primary) hypertension: Secondary | ICD-10-CM | POA: Diagnosis not present

## 2014-03-31 DIAGNOSIS — R809 Proteinuria, unspecified: Secondary | ICD-10-CM | POA: Diagnosis not present

## 2014-03-31 DIAGNOSIS — D649 Anemia, unspecified: Secondary | ICD-10-CM | POA: Diagnosis not present

## 2014-04-16 DIAGNOSIS — C61 Malignant neoplasm of prostate: Secondary | ICD-10-CM | POA: Diagnosis not present

## 2014-04-16 DIAGNOSIS — Z79899 Other long term (current) drug therapy: Secondary | ICD-10-CM | POA: Diagnosis not present

## 2014-04-16 DIAGNOSIS — R197 Diarrhea, unspecified: Secondary | ICD-10-CM | POA: Diagnosis not present

## 2014-04-16 DIAGNOSIS — Z510 Encounter for antineoplastic radiation therapy: Secondary | ICD-10-CM | POA: Diagnosis not present

## 2014-04-16 DIAGNOSIS — C801 Malignant (primary) neoplasm, unspecified: Secondary | ICD-10-CM | POA: Diagnosis not present

## 2014-04-16 DIAGNOSIS — Z5111 Encounter for antineoplastic chemotherapy: Secondary | ICD-10-CM | POA: Diagnosis not present

## 2014-04-16 DIAGNOSIS — C179 Malignant neoplasm of small intestine, unspecified: Secondary | ICD-10-CM | POA: Diagnosis not present

## 2014-04-16 DIAGNOSIS — I10 Essential (primary) hypertension: Secondary | ICD-10-CM | POA: Diagnosis not present

## 2014-04-17 DIAGNOSIS — Z79899 Other long term (current) drug therapy: Secondary | ICD-10-CM | POA: Diagnosis not present

## 2014-04-17 DIAGNOSIS — I10 Essential (primary) hypertension: Secondary | ICD-10-CM | POA: Diagnosis not present

## 2014-04-17 DIAGNOSIS — Z510 Encounter for antineoplastic radiation therapy: Secondary | ICD-10-CM | POA: Diagnosis not present

## 2014-04-17 DIAGNOSIS — C61 Malignant neoplasm of prostate: Secondary | ICD-10-CM | POA: Diagnosis not present

## 2014-04-20 DIAGNOSIS — C61 Malignant neoplasm of prostate: Secondary | ICD-10-CM | POA: Diagnosis not present

## 2014-04-20 DIAGNOSIS — Z79899 Other long term (current) drug therapy: Secondary | ICD-10-CM | POA: Diagnosis not present

## 2014-04-20 DIAGNOSIS — Z510 Encounter for antineoplastic radiation therapy: Secondary | ICD-10-CM | POA: Diagnosis not present

## 2014-04-20 DIAGNOSIS — I10 Essential (primary) hypertension: Secondary | ICD-10-CM | POA: Diagnosis not present

## 2014-04-21 DIAGNOSIS — C61 Malignant neoplasm of prostate: Secondary | ICD-10-CM | POA: Diagnosis not present

## 2014-04-21 DIAGNOSIS — I10 Essential (primary) hypertension: Secondary | ICD-10-CM | POA: Diagnosis not present

## 2014-04-21 DIAGNOSIS — Z79899 Other long term (current) drug therapy: Secondary | ICD-10-CM | POA: Diagnosis not present

## 2014-04-21 DIAGNOSIS — Z510 Encounter for antineoplastic radiation therapy: Secondary | ICD-10-CM | POA: Diagnosis not present

## 2014-04-22 DIAGNOSIS — I10 Essential (primary) hypertension: Secondary | ICD-10-CM | POA: Diagnosis not present

## 2014-04-22 DIAGNOSIS — Z510 Encounter for antineoplastic radiation therapy: Secondary | ICD-10-CM | POA: Diagnosis not present

## 2014-04-22 DIAGNOSIS — C61 Malignant neoplasm of prostate: Secondary | ICD-10-CM | POA: Diagnosis not present

## 2014-04-22 DIAGNOSIS — Z79899 Other long term (current) drug therapy: Secondary | ICD-10-CM | POA: Diagnosis not present

## 2014-04-23 DIAGNOSIS — Z510 Encounter for antineoplastic radiation therapy: Secondary | ICD-10-CM | POA: Diagnosis not present

## 2014-04-23 DIAGNOSIS — I10 Essential (primary) hypertension: Secondary | ICD-10-CM | POA: Diagnosis not present

## 2014-04-23 DIAGNOSIS — Z79899 Other long term (current) drug therapy: Secondary | ICD-10-CM | POA: Diagnosis not present

## 2014-04-23 DIAGNOSIS — C61 Malignant neoplasm of prostate: Secondary | ICD-10-CM | POA: Diagnosis not present

## 2014-04-24 DIAGNOSIS — I10 Essential (primary) hypertension: Secondary | ICD-10-CM | POA: Diagnosis not present

## 2014-04-24 DIAGNOSIS — Z510 Encounter for antineoplastic radiation therapy: Secondary | ICD-10-CM | POA: Diagnosis not present

## 2014-04-24 DIAGNOSIS — Z79899 Other long term (current) drug therapy: Secondary | ICD-10-CM | POA: Diagnosis not present

## 2014-04-24 DIAGNOSIS — C61 Malignant neoplasm of prostate: Secondary | ICD-10-CM | POA: Diagnosis not present

## 2014-04-27 DIAGNOSIS — Z79899 Other long term (current) drug therapy: Secondary | ICD-10-CM | POA: Diagnosis not present

## 2014-04-27 DIAGNOSIS — C61 Malignant neoplasm of prostate: Secondary | ICD-10-CM | POA: Diagnosis not present

## 2014-04-27 DIAGNOSIS — Z510 Encounter for antineoplastic radiation therapy: Secondary | ICD-10-CM | POA: Diagnosis not present

## 2014-04-27 DIAGNOSIS — I10 Essential (primary) hypertension: Secondary | ICD-10-CM | POA: Diagnosis not present

## 2014-04-28 DIAGNOSIS — Z510 Encounter for antineoplastic radiation therapy: Secondary | ICD-10-CM | POA: Diagnosis not present

## 2014-04-28 DIAGNOSIS — Z79899 Other long term (current) drug therapy: Secondary | ICD-10-CM | POA: Diagnosis not present

## 2014-04-28 DIAGNOSIS — I10 Essential (primary) hypertension: Secondary | ICD-10-CM | POA: Diagnosis not present

## 2014-04-28 DIAGNOSIS — C61 Malignant neoplasm of prostate: Secondary | ICD-10-CM | POA: Diagnosis not present

## 2014-04-29 DIAGNOSIS — I10 Essential (primary) hypertension: Secondary | ICD-10-CM | POA: Diagnosis not present

## 2014-04-29 DIAGNOSIS — Z79899 Other long term (current) drug therapy: Secondary | ICD-10-CM | POA: Diagnosis not present

## 2014-04-29 DIAGNOSIS — C61 Malignant neoplasm of prostate: Secondary | ICD-10-CM | POA: Diagnosis not present

## 2014-04-29 DIAGNOSIS — Z510 Encounter for antineoplastic radiation therapy: Secondary | ICD-10-CM | POA: Diagnosis not present

## 2014-04-30 DIAGNOSIS — I10 Essential (primary) hypertension: Secondary | ICD-10-CM | POA: Diagnosis not present

## 2014-04-30 DIAGNOSIS — C61 Malignant neoplasm of prostate: Secondary | ICD-10-CM | POA: Diagnosis not present

## 2014-04-30 DIAGNOSIS — Z79899 Other long term (current) drug therapy: Secondary | ICD-10-CM | POA: Diagnosis not present

## 2014-04-30 DIAGNOSIS — Z510 Encounter for antineoplastic radiation therapy: Secondary | ICD-10-CM | POA: Diagnosis not present

## 2014-05-01 DIAGNOSIS — I10 Essential (primary) hypertension: Secondary | ICD-10-CM | POA: Diagnosis not present

## 2014-05-01 DIAGNOSIS — Z510 Encounter for antineoplastic radiation therapy: Secondary | ICD-10-CM | POA: Diagnosis not present

## 2014-05-01 DIAGNOSIS — Z79899 Other long term (current) drug therapy: Secondary | ICD-10-CM | POA: Diagnosis not present

## 2014-05-01 DIAGNOSIS — C61 Malignant neoplasm of prostate: Secondary | ICD-10-CM | POA: Diagnosis not present

## 2014-05-03 DIAGNOSIS — Z510 Encounter for antineoplastic radiation therapy: Secondary | ICD-10-CM | POA: Diagnosis not present

## 2014-05-03 DIAGNOSIS — C61 Malignant neoplasm of prostate: Secondary | ICD-10-CM | POA: Diagnosis not present

## 2014-05-03 DIAGNOSIS — I10 Essential (primary) hypertension: Secondary | ICD-10-CM | POA: Diagnosis not present

## 2014-05-03 DIAGNOSIS — Z79899 Other long term (current) drug therapy: Secondary | ICD-10-CM | POA: Diagnosis not present

## 2014-05-04 DIAGNOSIS — Z79899 Other long term (current) drug therapy: Secondary | ICD-10-CM | POA: Diagnosis not present

## 2014-05-04 DIAGNOSIS — Z510 Encounter for antineoplastic radiation therapy: Secondary | ICD-10-CM | POA: Diagnosis not present

## 2014-05-04 DIAGNOSIS — I10 Essential (primary) hypertension: Secondary | ICD-10-CM | POA: Diagnosis not present

## 2014-05-04 DIAGNOSIS — C61 Malignant neoplasm of prostate: Secondary | ICD-10-CM | POA: Diagnosis not present

## 2014-05-05 DIAGNOSIS — I10 Essential (primary) hypertension: Secondary | ICD-10-CM | POA: Diagnosis not present

## 2014-05-05 DIAGNOSIS — Z79899 Other long term (current) drug therapy: Secondary | ICD-10-CM | POA: Diagnosis not present

## 2014-05-05 DIAGNOSIS — C61 Malignant neoplasm of prostate: Secondary | ICD-10-CM | POA: Diagnosis not present

## 2014-05-05 DIAGNOSIS — Z510 Encounter for antineoplastic radiation therapy: Secondary | ICD-10-CM | POA: Diagnosis not present

## 2014-05-06 DIAGNOSIS — I10 Essential (primary) hypertension: Secondary | ICD-10-CM | POA: Diagnosis not present

## 2014-05-06 DIAGNOSIS — Z79899 Other long term (current) drug therapy: Secondary | ICD-10-CM | POA: Diagnosis not present

## 2014-05-06 DIAGNOSIS — Z510 Encounter for antineoplastic radiation therapy: Secondary | ICD-10-CM | POA: Diagnosis not present

## 2014-05-06 DIAGNOSIS — C61 Malignant neoplasm of prostate: Secondary | ICD-10-CM | POA: Diagnosis not present

## 2014-05-07 DIAGNOSIS — Z510 Encounter for antineoplastic radiation therapy: Secondary | ICD-10-CM | POA: Diagnosis not present

## 2014-05-07 DIAGNOSIS — I10 Essential (primary) hypertension: Secondary | ICD-10-CM | POA: Diagnosis not present

## 2014-05-07 DIAGNOSIS — Z79899 Other long term (current) drug therapy: Secondary | ICD-10-CM | POA: Diagnosis not present

## 2014-05-07 DIAGNOSIS — C61 Malignant neoplasm of prostate: Secondary | ICD-10-CM | POA: Diagnosis not present

## 2014-05-08 DIAGNOSIS — Z510 Encounter for antineoplastic radiation therapy: Secondary | ICD-10-CM | POA: Diagnosis not present

## 2014-05-08 DIAGNOSIS — C61 Malignant neoplasm of prostate: Secondary | ICD-10-CM | POA: Diagnosis not present

## 2014-05-08 DIAGNOSIS — Z79899 Other long term (current) drug therapy: Secondary | ICD-10-CM | POA: Diagnosis not present

## 2014-05-08 DIAGNOSIS — I10 Essential (primary) hypertension: Secondary | ICD-10-CM | POA: Diagnosis not present

## 2014-05-11 DIAGNOSIS — C61 Malignant neoplasm of prostate: Secondary | ICD-10-CM | POA: Diagnosis not present

## 2014-05-11 DIAGNOSIS — Z79899 Other long term (current) drug therapy: Secondary | ICD-10-CM | POA: Diagnosis not present

## 2014-05-11 DIAGNOSIS — Z510 Encounter for antineoplastic radiation therapy: Secondary | ICD-10-CM | POA: Diagnosis not present

## 2014-05-11 DIAGNOSIS — I10 Essential (primary) hypertension: Secondary | ICD-10-CM | POA: Diagnosis not present

## 2014-05-12 DIAGNOSIS — Z79899 Other long term (current) drug therapy: Secondary | ICD-10-CM | POA: Diagnosis not present

## 2014-05-12 DIAGNOSIS — Z510 Encounter for antineoplastic radiation therapy: Secondary | ICD-10-CM | POA: Diagnosis not present

## 2014-05-12 DIAGNOSIS — C61 Malignant neoplasm of prostate: Secondary | ICD-10-CM | POA: Diagnosis not present

## 2014-05-12 DIAGNOSIS — I10 Essential (primary) hypertension: Secondary | ICD-10-CM | POA: Diagnosis not present

## 2014-05-13 DIAGNOSIS — I10 Essential (primary) hypertension: Secondary | ICD-10-CM | POA: Diagnosis not present

## 2014-05-13 DIAGNOSIS — C649 Malignant neoplasm of unspecified kidney, except renal pelvis: Secondary | ICD-10-CM | POA: Diagnosis not present

## 2014-05-13 DIAGNOSIS — D511 Vitamin B12 deficiency anemia due to selective vitamin B12 malabsorption with proteinuria: Secondary | ICD-10-CM | POA: Diagnosis not present

## 2014-05-13 DIAGNOSIS — E538 Deficiency of other specified B group vitamins: Secondary | ICD-10-CM | POA: Diagnosis not present

## 2014-05-13 DIAGNOSIS — D369 Benign neoplasm, unspecified site: Secondary | ICD-10-CM | POA: Diagnosis not present

## 2014-05-13 DIAGNOSIS — C61 Malignant neoplasm of prostate: Secondary | ICD-10-CM | POA: Diagnosis not present

## 2014-05-13 DIAGNOSIS — Z79899 Other long term (current) drug therapy: Secondary | ICD-10-CM | POA: Diagnosis not present

## 2014-05-13 DIAGNOSIS — D3A00 Benign carcinoid tumor of unspecified site: Secondary | ICD-10-CM | POA: Diagnosis not present

## 2014-05-13 DIAGNOSIS — N189 Chronic kidney disease, unspecified: Secondary | ICD-10-CM | POA: Diagnosis not present

## 2014-05-13 DIAGNOSIS — C641 Malignant neoplasm of right kidney, except renal pelvis: Secondary | ICD-10-CM | POA: Diagnosis not present

## 2014-05-13 DIAGNOSIS — C179 Malignant neoplasm of small intestine, unspecified: Secondary | ICD-10-CM | POA: Diagnosis not present

## 2014-05-13 DIAGNOSIS — Z510 Encounter for antineoplastic radiation therapy: Secondary | ICD-10-CM | POA: Diagnosis not present

## 2014-05-14 DIAGNOSIS — C179 Malignant neoplasm of small intestine, unspecified: Secondary | ICD-10-CM | POA: Diagnosis not present

## 2014-05-14 DIAGNOSIS — C61 Malignant neoplasm of prostate: Secondary | ICD-10-CM | POA: Diagnosis not present

## 2014-05-14 DIAGNOSIS — Z79899 Other long term (current) drug therapy: Secondary | ICD-10-CM | POA: Diagnosis not present

## 2014-05-14 DIAGNOSIS — I10 Essential (primary) hypertension: Secondary | ICD-10-CM | POA: Diagnosis not present

## 2014-05-14 DIAGNOSIS — Z510 Encounter for antineoplastic radiation therapy: Secondary | ICD-10-CM | POA: Diagnosis not present

## 2014-05-14 DIAGNOSIS — C641 Malignant neoplasm of right kidney, except renal pelvis: Secondary | ICD-10-CM | POA: Diagnosis not present

## 2014-05-14 DIAGNOSIS — E340 Carcinoid syndrome: Secondary | ICD-10-CM | POA: Diagnosis not present

## 2014-05-15 DIAGNOSIS — I10 Essential (primary) hypertension: Secondary | ICD-10-CM | POA: Diagnosis not present

## 2014-05-15 DIAGNOSIS — C61 Malignant neoplasm of prostate: Secondary | ICD-10-CM | POA: Diagnosis not present

## 2014-05-15 DIAGNOSIS — Z510 Encounter for antineoplastic radiation therapy: Secondary | ICD-10-CM | POA: Diagnosis not present

## 2014-05-15 DIAGNOSIS — Z79899 Other long term (current) drug therapy: Secondary | ICD-10-CM | POA: Diagnosis not present

## 2014-05-16 DIAGNOSIS — Z905 Acquired absence of kidney: Secondary | ICD-10-CM | POA: Diagnosis not present

## 2014-05-16 DIAGNOSIS — I10 Essential (primary) hypertension: Secondary | ICD-10-CM | POA: Diagnosis not present

## 2014-05-16 DIAGNOSIS — E780 Pure hypercholesterolemia: Secondary | ICD-10-CM | POA: Diagnosis not present

## 2014-05-16 DIAGNOSIS — Z85528 Personal history of other malignant neoplasm of kidney: Secondary | ICD-10-CM | POA: Diagnosis not present

## 2014-05-16 DIAGNOSIS — N184 Chronic kidney disease, stage 4 (severe): Secondary | ICD-10-CM | POA: Diagnosis not present

## 2014-05-16 DIAGNOSIS — I129 Hypertensive chronic kidney disease with stage 1 through stage 4 chronic kidney disease, or unspecified chronic kidney disease: Secondary | ICD-10-CM | POA: Diagnosis not present

## 2014-05-16 DIAGNOSIS — Z79899 Other long term (current) drug therapy: Secondary | ICD-10-CM | POA: Diagnosis not present

## 2014-05-16 DIAGNOSIS — C61 Malignant neoplasm of prostate: Secondary | ICD-10-CM | POA: Diagnosis not present

## 2014-05-18 DIAGNOSIS — Z510 Encounter for antineoplastic radiation therapy: Secondary | ICD-10-CM | POA: Diagnosis not present

## 2014-05-18 DIAGNOSIS — C61 Malignant neoplasm of prostate: Secondary | ICD-10-CM | POA: Diagnosis not present

## 2014-05-19 DIAGNOSIS — Z510 Encounter for antineoplastic radiation therapy: Secondary | ICD-10-CM | POA: Diagnosis not present

## 2014-05-19 DIAGNOSIS — C61 Malignant neoplasm of prostate: Secondary | ICD-10-CM | POA: Diagnosis not present

## 2014-05-20 DIAGNOSIS — Z510 Encounter for antineoplastic radiation therapy: Secondary | ICD-10-CM | POA: Diagnosis not present

## 2014-05-20 DIAGNOSIS — C61 Malignant neoplasm of prostate: Secondary | ICD-10-CM | POA: Diagnosis not present

## 2014-05-21 DIAGNOSIS — C61 Malignant neoplasm of prostate: Secondary | ICD-10-CM | POA: Diagnosis not present

## 2014-05-21 DIAGNOSIS — Z510 Encounter for antineoplastic radiation therapy: Secondary | ICD-10-CM | POA: Diagnosis not present

## 2014-05-22 DIAGNOSIS — Z510 Encounter for antineoplastic radiation therapy: Secondary | ICD-10-CM | POA: Diagnosis not present

## 2014-05-22 DIAGNOSIS — C61 Malignant neoplasm of prostate: Secondary | ICD-10-CM | POA: Diagnosis not present

## 2014-05-25 DIAGNOSIS — C61 Malignant neoplasm of prostate: Secondary | ICD-10-CM | POA: Diagnosis not present

## 2014-05-25 DIAGNOSIS — Z510 Encounter for antineoplastic radiation therapy: Secondary | ICD-10-CM | POA: Diagnosis not present

## 2014-05-26 DIAGNOSIS — C61 Malignant neoplasm of prostate: Secondary | ICD-10-CM | POA: Diagnosis not present

## 2014-05-26 DIAGNOSIS — Z510 Encounter for antineoplastic radiation therapy: Secondary | ICD-10-CM | POA: Diagnosis not present

## 2014-05-27 DIAGNOSIS — Z510 Encounter for antineoplastic radiation therapy: Secondary | ICD-10-CM | POA: Diagnosis not present

## 2014-05-27 DIAGNOSIS — C61 Malignant neoplasm of prostate: Secondary | ICD-10-CM | POA: Diagnosis not present

## 2014-05-28 DIAGNOSIS — C61 Malignant neoplasm of prostate: Secondary | ICD-10-CM | POA: Diagnosis not present

## 2014-05-28 DIAGNOSIS — Z510 Encounter for antineoplastic radiation therapy: Secondary | ICD-10-CM | POA: Diagnosis not present

## 2014-05-29 DIAGNOSIS — I10 Essential (primary) hypertension: Secondary | ICD-10-CM | POA: Diagnosis not present

## 2014-05-29 DIAGNOSIS — D649 Anemia, unspecified: Secondary | ICD-10-CM | POA: Diagnosis not present

## 2014-05-29 DIAGNOSIS — E559 Vitamin D deficiency, unspecified: Secondary | ICD-10-CM | POA: Diagnosis not present

## 2014-05-29 DIAGNOSIS — R809 Proteinuria, unspecified: Secondary | ICD-10-CM | POA: Diagnosis not present

## 2014-05-29 DIAGNOSIS — N183 Chronic kidney disease, stage 3 (moderate): Secondary | ICD-10-CM | POA: Diagnosis not present

## 2014-05-29 DIAGNOSIS — Z79899 Other long term (current) drug therapy: Secondary | ICD-10-CM | POA: Diagnosis not present

## 2014-06-01 DIAGNOSIS — C61 Malignant neoplasm of prostate: Secondary | ICD-10-CM | POA: Diagnosis not present

## 2014-06-01 DIAGNOSIS — Z510 Encounter for antineoplastic radiation therapy: Secondary | ICD-10-CM | POA: Diagnosis not present

## 2014-06-02 DIAGNOSIS — I509 Heart failure, unspecified: Secondary | ICD-10-CM | POA: Diagnosis not present

## 2014-06-02 DIAGNOSIS — R809 Proteinuria, unspecified: Secondary | ICD-10-CM | POA: Diagnosis not present

## 2014-06-02 DIAGNOSIS — C61 Malignant neoplasm of prostate: Secondary | ICD-10-CM | POA: Diagnosis not present

## 2014-06-02 DIAGNOSIS — N184 Chronic kidney disease, stage 4 (severe): Secondary | ICD-10-CM | POA: Diagnosis not present

## 2014-06-02 DIAGNOSIS — Z85528 Personal history of other malignant neoplasm of kidney: Secondary | ICD-10-CM | POA: Diagnosis not present

## 2014-06-02 DIAGNOSIS — Z510 Encounter for antineoplastic radiation therapy: Secondary | ICD-10-CM | POA: Diagnosis not present

## 2014-06-02 DIAGNOSIS — D631 Anemia in chronic kidney disease: Secondary | ICD-10-CM | POA: Diagnosis not present

## 2014-06-02 DIAGNOSIS — E876 Hypokalemia: Secondary | ICD-10-CM | POA: Diagnosis not present

## 2014-06-03 DIAGNOSIS — Z510 Encounter for antineoplastic radiation therapy: Secondary | ICD-10-CM | POA: Diagnosis not present

## 2014-06-03 DIAGNOSIS — C61 Malignant neoplasm of prostate: Secondary | ICD-10-CM | POA: Diagnosis not present

## 2014-06-04 DIAGNOSIS — C61 Malignant neoplasm of prostate: Secondary | ICD-10-CM | POA: Diagnosis not present

## 2014-06-04 DIAGNOSIS — Z510 Encounter for antineoplastic radiation therapy: Secondary | ICD-10-CM | POA: Diagnosis not present

## 2014-06-05 DIAGNOSIS — Z510 Encounter for antineoplastic radiation therapy: Secondary | ICD-10-CM | POA: Diagnosis not present

## 2014-06-05 DIAGNOSIS — C61 Malignant neoplasm of prostate: Secondary | ICD-10-CM | POA: Diagnosis not present

## 2014-06-08 DIAGNOSIS — C61 Malignant neoplasm of prostate: Secondary | ICD-10-CM | POA: Diagnosis not present

## 2014-06-08 DIAGNOSIS — Z510 Encounter for antineoplastic radiation therapy: Secondary | ICD-10-CM | POA: Diagnosis not present

## 2014-06-09 DIAGNOSIS — E340 Carcinoid syndrome: Secondary | ICD-10-CM | POA: Diagnosis not present

## 2014-06-09 DIAGNOSIS — Z510 Encounter for antineoplastic radiation therapy: Secondary | ICD-10-CM | POA: Diagnosis not present

## 2014-06-09 DIAGNOSIS — C179 Malignant neoplasm of small intestine, unspecified: Secondary | ICD-10-CM | POA: Diagnosis not present

## 2014-06-09 DIAGNOSIS — C61 Malignant neoplasm of prostate: Secondary | ICD-10-CM | POA: Diagnosis not present

## 2014-06-09 DIAGNOSIS — C641 Malignant neoplasm of right kidney, except renal pelvis: Secondary | ICD-10-CM | POA: Diagnosis not present

## 2014-07-08 DIAGNOSIS — C641 Malignant neoplasm of right kidney, except renal pelvis: Secondary | ICD-10-CM | POA: Diagnosis not present

## 2014-07-08 DIAGNOSIS — C179 Malignant neoplasm of small intestine, unspecified: Secondary | ICD-10-CM | POA: Diagnosis not present

## 2014-07-08 DIAGNOSIS — E340 Carcinoid syndrome: Secondary | ICD-10-CM | POA: Diagnosis not present

## 2014-07-28 DIAGNOSIS — C320 Malignant neoplasm of glottis: Secondary | ICD-10-CM | POA: Diagnosis not present

## 2014-07-28 DIAGNOSIS — C61 Malignant neoplasm of prostate: Secondary | ICD-10-CM | POA: Diagnosis not present

## 2014-07-28 DIAGNOSIS — R499 Unspecified voice and resonance disorder: Secondary | ICD-10-CM | POA: Diagnosis not present

## 2014-08-03 DIAGNOSIS — C61 Malignant neoplasm of prostate: Secondary | ICD-10-CM | POA: Diagnosis not present

## 2014-08-03 DIAGNOSIS — L0231 Cutaneous abscess of buttock: Secondary | ICD-10-CM | POA: Diagnosis not present

## 2014-08-06 DIAGNOSIS — C801 Malignant (primary) neoplasm, unspecified: Secondary | ICD-10-CM | POA: Diagnosis not present

## 2014-08-06 DIAGNOSIS — C641 Malignant neoplasm of right kidney, except renal pelvis: Secondary | ICD-10-CM | POA: Diagnosis not present

## 2014-08-06 DIAGNOSIS — E340 Carcinoid syndrome: Secondary | ICD-10-CM | POA: Diagnosis not present

## 2014-08-06 DIAGNOSIS — C179 Malignant neoplasm of small intestine, unspecified: Secondary | ICD-10-CM | POA: Diagnosis not present

## 2014-08-13 DIAGNOSIS — R809 Proteinuria, unspecified: Secondary | ICD-10-CM | POA: Diagnosis not present

## 2014-08-13 DIAGNOSIS — N183 Chronic kidney disease, stage 3 (moderate): Secondary | ICD-10-CM | POA: Diagnosis not present

## 2014-08-13 DIAGNOSIS — I129 Hypertensive chronic kidney disease with stage 1 through stage 4 chronic kidney disease, or unspecified chronic kidney disease: Secondary | ICD-10-CM | POA: Diagnosis not present

## 2014-08-13 DIAGNOSIS — E559 Vitamin D deficiency, unspecified: Secondary | ICD-10-CM | POA: Diagnosis not present

## 2014-08-13 DIAGNOSIS — Z79899 Other long term (current) drug therapy: Secondary | ICD-10-CM | POA: Diagnosis not present

## 2014-08-13 DIAGNOSIS — D649 Anemia, unspecified: Secondary | ICD-10-CM | POA: Diagnosis not present

## 2014-08-18 DIAGNOSIS — E876 Hypokalemia: Secondary | ICD-10-CM | POA: Diagnosis not present

## 2014-08-18 DIAGNOSIS — E872 Acidosis: Secondary | ICD-10-CM | POA: Diagnosis not present

## 2014-08-18 DIAGNOSIS — I509 Heart failure, unspecified: Secondary | ICD-10-CM | POA: Diagnosis not present

## 2014-08-18 DIAGNOSIS — N184 Chronic kidney disease, stage 4 (severe): Secondary | ICD-10-CM | POA: Diagnosis not present

## 2014-08-18 DIAGNOSIS — D649 Anemia, unspecified: Secondary | ICD-10-CM | POA: Diagnosis not present

## 2014-08-18 DIAGNOSIS — I10 Essential (primary) hypertension: Secondary | ICD-10-CM | POA: Diagnosis not present

## 2014-08-18 DIAGNOSIS — R809 Proteinuria, unspecified: Secondary | ICD-10-CM | POA: Diagnosis not present

## 2014-08-19 DIAGNOSIS — D631 Anemia in chronic kidney disease: Secondary | ICD-10-CM | POA: Insufficient documentation

## 2014-08-19 DIAGNOSIS — C801 Malignant (primary) neoplasm, unspecified: Secondary | ICD-10-CM | POA: Diagnosis not present

## 2014-08-19 DIAGNOSIS — N184 Chronic kidney disease, stage 4 (severe): Secondary | ICD-10-CM

## 2014-08-19 DIAGNOSIS — D509 Iron deficiency anemia, unspecified: Secondary | ICD-10-CM | POA: Insufficient documentation

## 2014-08-19 DIAGNOSIS — C641 Malignant neoplasm of right kidney, except renal pelvis: Secondary | ICD-10-CM | POA: Diagnosis not present

## 2014-08-19 DIAGNOSIS — K9049 Malabsorption due to intolerance, not elsewhere classified: Secondary | ICD-10-CM | POA: Insufficient documentation

## 2014-08-19 DIAGNOSIS — D369 Benign neoplasm, unspecified site: Secondary | ICD-10-CM | POA: Diagnosis not present

## 2014-08-19 DIAGNOSIS — C61 Malignant neoplasm of prostate: Secondary | ICD-10-CM | POA: Diagnosis not present

## 2014-08-19 DIAGNOSIS — C179 Malignant neoplasm of small intestine, unspecified: Secondary | ICD-10-CM | POA: Diagnosis not present

## 2014-08-19 DIAGNOSIS — N289 Disorder of kidney and ureter, unspecified: Secondary | ICD-10-CM | POA: Insufficient documentation

## 2014-09-03 DIAGNOSIS — C179 Malignant neoplasm of small intestine, unspecified: Secondary | ICD-10-CM | POA: Diagnosis not present

## 2014-09-03 DIAGNOSIS — C641 Malignant neoplasm of right kidney, except renal pelvis: Secondary | ICD-10-CM | POA: Diagnosis not present

## 2014-09-03 DIAGNOSIS — C801 Malignant (primary) neoplasm, unspecified: Secondary | ICD-10-CM | POA: Diagnosis not present

## 2014-09-15 DIAGNOSIS — I120 Hypertensive chronic kidney disease with stage 5 chronic kidney disease or end stage renal disease: Secondary | ICD-10-CM | POA: Diagnosis not present

## 2014-09-15 DIAGNOSIS — Z6821 Body mass index (BMI) 21.0-21.9, adult: Secondary | ICD-10-CM | POA: Diagnosis not present

## 2014-09-15 DIAGNOSIS — I10 Essential (primary) hypertension: Secondary | ICD-10-CM | POA: Diagnosis not present

## 2014-09-22 ENCOUNTER — Encounter: Payer: Self-pay | Admitting: Vascular Surgery

## 2014-09-22 ENCOUNTER — Other Ambulatory Visit: Payer: Self-pay | Admitting: *Deleted

## 2014-09-22 DIAGNOSIS — Z01810 Encounter for preprocedural cardiovascular examination: Secondary | ICD-10-CM

## 2014-09-22 DIAGNOSIS — N184 Chronic kidney disease, stage 4 (severe): Secondary | ICD-10-CM

## 2014-09-24 DIAGNOSIS — M79672 Pain in left foot: Secondary | ICD-10-CM | POA: Diagnosis not present

## 2014-09-24 DIAGNOSIS — B351 Tinea unguium: Secondary | ICD-10-CM | POA: Diagnosis not present

## 2014-09-24 DIAGNOSIS — M79671 Pain in right foot: Secondary | ICD-10-CM | POA: Diagnosis not present

## 2014-09-24 DIAGNOSIS — G600 Hereditary motor and sensory neuropathy: Secondary | ICD-10-CM | POA: Diagnosis not present

## 2014-09-28 DIAGNOSIS — Z79899 Other long term (current) drug therapy: Secondary | ICD-10-CM | POA: Diagnosis not present

## 2014-09-28 DIAGNOSIS — N183 Chronic kidney disease, stage 3 (moderate): Secondary | ICD-10-CM | POA: Diagnosis not present

## 2014-09-28 DIAGNOSIS — I129 Hypertensive chronic kidney disease with stage 1 through stage 4 chronic kidney disease, or unspecified chronic kidney disease: Secondary | ICD-10-CM | POA: Diagnosis not present

## 2014-09-28 DIAGNOSIS — D509 Iron deficiency anemia, unspecified: Secondary | ICD-10-CM | POA: Diagnosis not present

## 2014-09-28 DIAGNOSIS — E559 Vitamin D deficiency, unspecified: Secondary | ICD-10-CM | POA: Diagnosis not present

## 2014-09-28 DIAGNOSIS — R809 Proteinuria, unspecified: Secondary | ICD-10-CM | POA: Diagnosis not present

## 2014-09-29 DIAGNOSIS — I10 Essential (primary) hypertension: Secondary | ICD-10-CM | POA: Diagnosis not present

## 2014-09-29 DIAGNOSIS — Z6822 Body mass index (BMI) 22.0-22.9, adult: Secondary | ICD-10-CM | POA: Diagnosis not present

## 2014-09-29 DIAGNOSIS — Z0000 Encounter for general adult medical examination without abnormal findings: Secondary | ICD-10-CM | POA: Diagnosis not present

## 2014-10-01 DIAGNOSIS — D631 Anemia in chronic kidney disease: Secondary | ICD-10-CM | POA: Diagnosis not present

## 2014-10-01 DIAGNOSIS — C801 Malignant (primary) neoplasm, unspecified: Secondary | ICD-10-CM | POA: Diagnosis not present

## 2014-10-01 DIAGNOSIS — C641 Malignant neoplasm of right kidney, except renal pelvis: Secondary | ICD-10-CM | POA: Diagnosis not present

## 2014-10-01 DIAGNOSIS — C179 Malignant neoplasm of small intestine, unspecified: Secondary | ICD-10-CM | POA: Diagnosis not present

## 2014-10-01 DIAGNOSIS — N189 Chronic kidney disease, unspecified: Secondary | ICD-10-CM | POA: Diagnosis not present

## 2014-10-13 ENCOUNTER — Encounter: Payer: Self-pay | Admitting: Vascular Surgery

## 2014-10-13 DIAGNOSIS — R809 Proteinuria, unspecified: Secondary | ICD-10-CM | POA: Diagnosis not present

## 2014-10-13 DIAGNOSIS — E872 Acidosis: Secondary | ICD-10-CM | POA: Diagnosis not present

## 2014-10-13 DIAGNOSIS — D649 Anemia, unspecified: Secondary | ICD-10-CM | POA: Diagnosis not present

## 2014-10-13 DIAGNOSIS — N184 Chronic kidney disease, stage 4 (severe): Secondary | ICD-10-CM | POA: Diagnosis not present

## 2014-10-13 DIAGNOSIS — I10 Essential (primary) hypertension: Secondary | ICD-10-CM | POA: Diagnosis not present

## 2014-10-13 DIAGNOSIS — I509 Heart failure, unspecified: Secondary | ICD-10-CM | POA: Diagnosis not present

## 2014-10-13 DIAGNOSIS — E876 Hypokalemia: Secondary | ICD-10-CM | POA: Diagnosis not present

## 2014-10-14 ENCOUNTER — Ambulatory Visit (HOSPITAL_COMMUNITY)
Admission: RE | Admit: 2014-10-14 | Discharge: 2014-10-14 | Disposition: A | Discharge status: Home / Self Care | Payer: Medicare Other | Source: Ambulatory Visit | Attending: Vascular Surgery | Admitting: Vascular Surgery

## 2014-10-14 ENCOUNTER — Ambulatory Visit (INDEPENDENT_AMBULATORY_CARE_PROVIDER_SITE_OTHER): Payer: Medicare Other | Admitting: Vascular Surgery

## 2014-10-14 ENCOUNTER — Encounter: Payer: Self-pay | Admitting: Vascular Surgery

## 2014-10-14 ENCOUNTER — Ambulatory Visit (INDEPENDENT_AMBULATORY_CARE_PROVIDER_SITE_OTHER)
Admission: RE | Admit: 2014-10-14 | Discharge: 2014-10-14 | Disposition: A | Discharge status: Home / Self Care | Payer: Medicare Other | Source: Ambulatory Visit | Attending: Vascular Surgery | Admitting: Vascular Surgery

## 2014-10-14 ENCOUNTER — Other Ambulatory Visit: Payer: Self-pay

## 2014-10-14 VITALS — BP 191/97 | HR 44 | Ht 77.0 in | Wt 190.0 lb

## 2014-10-14 DIAGNOSIS — N184 Chronic kidney disease, stage 4 (severe): Secondary | ICD-10-CM

## 2014-10-14 DIAGNOSIS — Z01810 Encounter for preprocedural cardiovascular examination: Secondary | ICD-10-CM

## 2014-10-14 NOTE — Progress Notes (Signed)
[facility]  HISTORY AND PHYSICAL   History of Present Illness:  Patient is a 74 y.o. year old male who presents for placement of a permanent hemodialysis access. The patient is right handed .  The patient is not currently on hemodialysis.  The cause of renal failure is thought to be secondary to renal cancer with prior nephrectomy as well as hypertension.  Other chronic medical problems include hyperlipidemia, anemia, congestive failure all of which are currently stable..  Past Medical History  Diagnosis Date  . Hypertension   . Hyperlipidemia   . Edema   . Cancer     right renal   . Chronic kidney disease   . CHF (congestive heart failure)   . Anemia     Past Surgical History  Procedure Laterality Date  . Nephrectomy Right      Social History History  Substance Use Topics  . Smoking status: Never Smoker   . Smokeless tobacco: Never Used  . Alcohol Use: No    Family History Family History  Problem Relation Age of Onset  . Cancer Mother   . Hypertension Father   . Cancer Sister     Allergies  No Known Allergies   Current Outpatient Prescriptions  Medication Sig Dispense Refill  . amLODipine (NORVASC) 10 MG tablet Take 10 mg by mouth daily.    [name] atorvastatin (LIPITOR) 80 MG tablet Take 80 mg by mouth daily.    . bisoprolol-hydrochlorothiazide (ZIAC) 10-6.25 MG per tablet Take 1 tablet by mouth daily.    . cholecalciferol (VITAMIN D) 1000 UNITS tablet Take 1,000 Units by mouth daily.    . furosemide (LASIX) 40 MG tablet Take 40 mg by mouth.    . hydrOXYzine (ATARAX/VISTARIL) 25 MG tablet Take 25 mg by mouth 3 (three) times daily as needed.    . labetalol (NORMODYNE) 300 MG tablet Take 300 mg by mouth 2 (two) times daily.    [name] octreotide (SANDOSTATIN LAR) 30 MG injection Inject 30 mg into the muscle every 28 (twenty-eight) days.    . potassium chloride (KLOR-CON) 20 MEQ packet Take by mouth daily.    . predniSONE (DELTASONE) 10 MG  tablet Take 10 mg by mouth daily with breakfast.    . carvedilol (COREG) 3.125 MG tablet Take 3.125 mg by mouth 2 (two) times daily with a meal.    . lisinopril (PRINIVIL,ZESTRIL) 5 MG tablet Take 5 mg by mouth daily.    . sodium bicarbonate 650 MG tablet Take 650 mg by mouth daily.     No current facility-administered medications for this visit.    ROS:   General:  No weight loss, Fever, chills  HEENT: No recent headaches, no nasal bleeding, no visual changes, no sore throat  Neurologic: No dizziness, blackouts, seizures. No recent symptoms of stroke or mini- stroke. No recent episodes of slurred speech, or temporary blindness.  Cardiac: No recent episodes of chest pain/pressure, no shortness of breath at rest.  No shortness of breath with exertion.  Denies history of atrial fibrillation or irregular heartbeat  Vascular: No history of rest pain in feet.  No history of claudication.  No history of non-healing ulcer, No history of DVT   Pulmonary: No home oxygen, no productive cough, no hemoptysis,  No asthma or wheezing  Musculoskeletal:  [ ]  Arthritis, [ ]  Low back pain,  [ ]  Joint pain  Hematologic:No history of hypercoagulable state.  No history of easy bleeding.  No history of anemia  Gastrointestinal: No hematochezia or melena,  No gastroesophageal reflux, no trouble swallowing  Urinary: [x ] chronic Kidney disease, [ ]  on HD - [ ]  MWF or [ ]  TTHS, [ ]  Burning with urination, [ ]  Frequent urination, [ ]  Difficulty urinating;   Skin: No rashes  Psychological: No history of anxiety,  No history of depression   Physical Examination  Filed Vitals:   10/14/14 1507 10/14/14 1511  BP: 197/93 191/97  Pulse: 48 44  Height: 6\' 5"  (1.956 m)   Weight: 190 lb (86.183 kg)     Body mass index is 22.53 kg/(m^2).  General:  Alert and oriented, no acute distress HEENT: Normal Neck: No bruit or JVD Pulmonary: Clear to auscultation bilaterally Cardiac: Regular Rate and Rhythm  without murmur Gastrointestinal: Soft, non-tender, non-distended, no mass Skin: No rash Extremity Pulses:  2+ radial, brachial pulses bilaterally Musculoskeletal: No deformity or edema  Neurologic: Upper and lower extremity motor 5/5 and symmetric  DATA: Patient had a vein mapping ultrasound performed which I reviewed and interpreted. The cephalic vein in the left upper arm is 2.5-2.7 mm in diameter. Otherwise the cephalic vein is of poor quality bilaterally. The basilic vein is also marginal bilaterally.   ASSESSMENT:   Needs long-term hemodialysis access.   PLAN:  Left brachiocephalic AV fistula scheduled for 10/20/2014. Risks benefits possible complications and procedure details of AV fistula placement were explained the patient today. These include but are not limited to bleeding infection ischemic steal non-maturation of fistula. He understands and agrees to proceed.  Ruta Hinds, MD [facility] [place] Office: [phone] Pager: [phone]

## 2014-10-14 NOTE — Progress Notes (Signed)
Filed Vitals:   10/14/14 1507 10/14/14 1511  BP: 197/93 191/97  Pulse: 48 44  Height: 6\' 5"  (1.956 m)   Weight: 190 lb (86.183 kg)    Body mass index is 22.53 kg/(m^2).

## 2014-10-15 ENCOUNTER — Encounter: Payer: Self-pay | Admitting: Nephrology

## 2014-10-19 ENCOUNTER — Encounter (HOSPITAL_COMMUNITY): Payer: Self-pay | Admitting: *Deleted

## 2014-10-19 MED ORDER — SODIUM CHLORIDE 0.9 % IV SOLN: INTRAVENOUS | Status: DC

## 2014-10-19 MED ORDER — CHLORHEXIDINE GLUCONATE CLOTH 2 % EX PADS: 6.0000 | MEDICATED_PAD | Freq: Once | CUTANEOUS | Status: DC

## 2014-10-19 MED ORDER — DEXTROSE 5 % IV SOLN
1.5000 g | INTRAVENOUS | Status: AC
  Administered 2014-10-20: 1.5 g via INTRAVENOUS
  Filled 2014-10-19: qty 1.5

## 2014-10-20 ENCOUNTER — Encounter (HOSPITAL_COMMUNITY): Payer: Self-pay | Admitting: *Deleted

## 2014-10-20 ENCOUNTER — Ambulatory Visit (HOSPITAL_COMMUNITY): Payer: Medicare Other | Admitting: Anesthesiology

## 2014-10-20 ENCOUNTER — Encounter (HOSPITAL_COMMUNITY)
Admission: RE | Disposition: A | Discharge status: Home / Self Care | Payer: Self-pay | Source: Ambulatory Visit | Attending: Vascular Surgery

## 2014-10-20 ENCOUNTER — Ambulatory Visit (HOSPITAL_COMMUNITY)
Admission: RE | Admit: 2014-10-20 | Discharge: 2014-10-20 | Disposition: A | Discharge status: Home / Self Care | Payer: Medicare Other | Source: Ambulatory Visit | Attending: Vascular Surgery | Admitting: Vascular Surgery

## 2014-10-20 ENCOUNTER — Other Ambulatory Visit: Payer: Medicare Other | Admitting: *Deleted

## 2014-10-20 DIAGNOSIS — I509 Heart failure, unspecified: Secondary | ICD-10-CM | POA: Diagnosis not present

## 2014-10-20 DIAGNOSIS — N186 End stage renal disease: Secondary | ICD-10-CM | POA: Insufficient documentation

## 2014-10-20 DIAGNOSIS — Z905 Acquired absence of kidney: Secondary | ICD-10-CM | POA: Diagnosis not present

## 2014-10-20 DIAGNOSIS — D649 Anemia, unspecified: Secondary | ICD-10-CM | POA: Insufficient documentation

## 2014-10-20 DIAGNOSIS — Z85528 Personal history of other malignant neoplasm of kidney: Secondary | ICD-10-CM | POA: Diagnosis not present

## 2014-10-20 DIAGNOSIS — Z4931 Encounter for adequacy testing for hemodialysis: Secondary | ICD-10-CM

## 2014-10-20 DIAGNOSIS — I120 Hypertensive chronic kidney disease with stage 5 chronic kidney disease or end stage renal disease: Secondary | ICD-10-CM | POA: Diagnosis not present

## 2014-10-20 DIAGNOSIS — E785 Hyperlipidemia, unspecified: Secondary | ICD-10-CM | POA: Diagnosis not present

## 2014-10-20 DIAGNOSIS — N185 Chronic kidney disease, stage 5: Secondary | ICD-10-CM | POA: Diagnosis not present

## 2014-10-20 HISTORY — DX: Cardiac murmur, unspecified: R01.1

## 2014-10-20 HISTORY — PX: AV FISTULA PLACEMENT: SHX1204

## 2014-10-20 LAB — POCT I-STAT 4, (NA,K, GLUC, HGB,HCT)
Glucose, Bld: 117 mg/dL — ABNORMAL HIGH (ref 70–99)
HCT: 22 % — ABNORMAL LOW (ref 39.0–52.0)
Hemoglobin: 7.5 g/dL — ABNORMAL LOW (ref 13.0–17.0)
Potassium: 4.2 mmol/L (ref 3.5–5.1)
SODIUM: 139 mmol/L (ref 135–145)

## 2014-10-20 SURGERY — ARTERIOVENOUS (AV) FISTULA CREATION
Anesthesia: General | Site: Arm Lower | Laterality: Left

## 2014-10-20 MED ORDER — SODIUM CHLORIDE 0.9 % IV SOLN
INTRAVENOUS | Status: DC
  Administered 2014-10-20: 35 mL/h via INTRAVENOUS

## 2014-10-20 MED ORDER — FENTANYL CITRATE 0.05 MG/ML IJ SOLN
INTRAMUSCULAR | Status: DC | PRN
  Administered 2014-10-20 (×2): 50 ug via INTRAVENOUS

## 2014-10-20 MED ORDER — THROMBIN 20000 UNITS EX SOLR
CUTANEOUS | Status: AC
  Filled 2014-10-20: qty 20000

## 2014-10-20 MED ORDER — 0.9 % SODIUM CHLORIDE (POUR BTL) OPTIME
TOPICAL | Status: DC | PRN
  Administered 2014-10-20: 1000 mL

## 2014-10-20 MED ORDER — PROPOFOL 10 MG/ML IV BOLUS
INTRAVENOUS | Status: AC
  Filled 2014-10-20: qty 20

## 2014-10-20 MED ORDER — SODIUM CHLORIDE 0.9 % IV SOLN: INTRAVENOUS | Status: DC

## 2014-10-20 MED ORDER — FENTANYL CITRATE 0.05 MG/ML IJ SOLN: 25.0000 ug | INTRAMUSCULAR | Status: DC | PRN

## 2014-10-20 MED ORDER — MIDAZOLAM HCL 2 MG/2ML IJ SOLN
INTRAMUSCULAR | Status: AC
  Filled 2014-10-20: qty 2

## 2014-10-20 MED ORDER — FENTANYL CITRATE 0.05 MG/ML IJ SOLN
INTRAMUSCULAR | Status: AC
  Filled 2014-10-20: qty 5

## 2014-10-20 MED ORDER — GLYCOPYRROLATE 0.2 MG/ML IJ SOLN
INTRAMUSCULAR | Status: DC | PRN
  Administered 2014-10-20: 0.4 mg via INTRAVENOUS

## 2014-10-20 MED ORDER — ONDANSETRON HCL 4 MG/2ML IJ SOLN
INTRAMUSCULAR | Status: DC | PRN
  Administered 2014-10-20: 4 mg via INTRAVENOUS

## 2014-10-20 MED ORDER — LIDOCAINE HCL (CARDIAC) 20 MG/ML IV SOLN
INTRAVENOUS | Status: AC
  Filled 2014-10-20: qty 5

## 2014-10-20 MED ORDER — PROPOFOL 10 MG/ML IV BOLUS
INTRAVENOUS | Status: DC | PRN
  Administered 2014-10-20: 200 mg via INTRAVENOUS
  Administered 2014-10-20: 70 mg via INTRAVENOUS

## 2014-10-20 MED ORDER — HEPARIN SODIUM (PORCINE) 1000 UNIT/ML IJ SOLN
INTRAMUSCULAR | Status: DC | PRN
  Administered 2014-10-20: 7000 [IU] via INTRAVENOUS

## 2014-10-20 MED ORDER — MIDAZOLAM HCL 5 MG/5ML IJ SOLN
INTRAMUSCULAR | Status: DC | PRN
  Administered 2014-10-20: 1 mg via INTRAVENOUS

## 2014-10-20 MED ORDER — LIDOCAINE HCL (PF) 1 % IJ SOLN
INTRAMUSCULAR | Status: AC
  Filled 2014-10-20: qty 30

## 2014-10-20 MED ORDER — SUCCINYLCHOLINE CHLORIDE 20 MG/ML IJ SOLN
INTRAMUSCULAR | Status: DC | PRN
  Administered 2014-10-20: 100 mg via INTRAVENOUS

## 2014-10-20 MED ORDER — SODIUM CHLORIDE 0.9 % IV SOLN
INTRAVENOUS | Status: DC | PRN
  Administered 2014-10-20: 15:00:00 via INTRAVENOUS

## 2014-10-20 MED ORDER — EPHEDRINE SULFATE 50 MG/ML IJ SOLN
INTRAMUSCULAR | Status: DC | PRN
  Administered 2014-10-20 (×3): 10 mg via INTRAVENOUS

## 2014-10-20 MED ORDER — ONDANSETRON HCL 4 MG/2ML IJ SOLN
INTRAMUSCULAR | Status: AC
  Filled 2014-10-20: qty 2

## 2014-10-20 MED ORDER — DEXTROSE 5 % IV SOLN
10.0000 mg | INTRAVENOUS | Status: DC | PRN
  Administered 2014-10-20: 30 ug/min via INTRAVENOUS

## 2014-10-20 MED ORDER — LIDOCAINE HCL (CARDIAC) 20 MG/ML IV SOLN
INTRAVENOUS | Status: DC | PRN
  Administered 2014-10-20: 60 mg via INTRAVENOUS

## 2014-10-20 MED ORDER — ALBUMIN HUMAN 5 % IV SOLN
INTRAVENOUS | Status: DC | PRN
  Administered 2014-10-20 (×2): via INTRAVENOUS

## 2014-10-20 MED ORDER — SODIUM CHLORIDE 0.9 % IR SOLN
Status: DC | PRN
  Administered 2014-10-20: 500 mL

## 2014-10-20 MED ORDER — OXYCODONE-ACETAMINOPHEN 5-325 MG PO TABS: 1.0000 | ORAL_TABLET | Freq: Four times a day (QID) | ORAL | Status: DC | PRN

## 2014-10-20 SURGICAL SUPPLY — 33 items
ARMBAND PINK RESTRICT EXTREMIT (MISCELLANEOUS) ×2 IMPLANT
CANISTER SUCTION 2500CC (MISCELLANEOUS) ×2 IMPLANT
CANNULA VESSEL 3MM 2 BLNT TIP (CANNULA) ×2 IMPLANT
CLIP TI MEDIUM 6 (CLIP) ×2 IMPLANT
CLIP TI WIDE RED SMALL 6 (CLIP) ×2 IMPLANT
COVER PROBE W GEL 5X96 (DRAPES) IMPLANT
DECANTER SPIKE VIAL GLASS SM (MISCELLANEOUS) ×2 IMPLANT
DRAIN PENROSE 1/4X12 LTX STRL (WOUND CARE) ×2 IMPLANT
ELECT REM PT RETURN 9FT ADLT (ELECTROSURGICAL) ×2
ELECTRODE REM PT RTRN 9FT ADLT (ELECTROSURGICAL) ×1 IMPLANT
GLOVE BIO SURGEON STRL SZ 6.5 (GLOVE) ×4 IMPLANT
GLOVE BIO SURGEON STRL SZ7.5 (GLOVE) ×2 IMPLANT
GLOVE BIOGEL PI IND STRL 6.5 (GLOVE) ×4 IMPLANT
GLOVE BIOGEL PI INDICATOR 6.5 (GLOVE) ×4
GLOVE OPTIFIT SS 6.5 STRL BRWN (GLOVE) ×2 IMPLANT
GOWN BRE IMP SLV AUR XL STRL (GOWN DISPOSABLE) ×2 IMPLANT
GOWN STRL REUS W/ TWL LRG LVL3 (GOWN DISPOSABLE) ×3 IMPLANT
GOWN STRL REUS W/TWL LRG LVL3 (GOWN DISPOSABLE) ×3
KIT BASIN OR (CUSTOM PROCEDURE TRAY) ×2 IMPLANT
KIT ROOM TURNOVER OR (KITS) ×2 IMPLANT
LIQUID BAND (GAUZE/BANDAGES/DRESSINGS) ×2 IMPLANT
LOOP VESSEL MINI RED (MISCELLANEOUS) IMPLANT
NS IRRIG 1000ML POUR BTL (IV SOLUTION) ×2 IMPLANT
PACK CV ACCESS (CUSTOM PROCEDURE TRAY) ×2 IMPLANT
PAD ARMBOARD 7.5X6 YLW CONV (MISCELLANEOUS) ×4 IMPLANT
SPONGE SURGIFOAM ABS GEL 100 (HEMOSTASIS) IMPLANT
SUT PROLENE 6 0 BV (SUTURE) IMPLANT
SUT PROLENE 7 0 BV 1 (SUTURE) ×2 IMPLANT
SUT VIC AB 3-0 SH 27 (SUTURE) ×1
SUT VIC AB 3-0 SH 27X BRD (SUTURE) ×1 IMPLANT
SUT VICRYL 4-0 PS2 18IN ABS (SUTURE) ×2 IMPLANT
UNDERPAD 30X30 INCONTINENT (UNDERPADS AND DIAPERS) ×2 IMPLANT
WATER STERILE IRR 1000ML POUR (IV SOLUTION) ×2 IMPLANT

## 2014-10-20 NOTE — Anesthesia Procedure Notes (Addendum)
Procedure Name: Intubation Date/Time: 10/20/2014 3:09 PM Performed by: Susa Loffler Pre-anesthesia Checklist: Patient identified, Timeout performed, Emergency Drugs available, Suction available and Patient being monitored Patient Re-evaluated:Patient Re-evaluated prior to inductionOxygen Delivery Method: Circle system utilized Preoxygenation: Pre-oxygenation with 100% oxygen Intubation Type: IV induction Ventilation: Mask ventilation without difficulty and Oral airway inserted - appropriate to patient size Laryngoscope Size: Mac and 4 Grade View: Grade I Tube type: Oral Tube size: 7.5 mm Number of attempts: 1 Airway Equipment and Method: Stylet and Oral airway Placement Confirmation: ETT inserted through vocal cords under direct vision,  positive ETCO2 and breath sounds checked- equal and bilateral Secured at: 23 cm Tube secured with: Tape Dental Injury: Teeth and Oropharynx as per pre-operative assessment  Comments: Unable to seat LMA #5. Convert to GETA. VSS throughout.

## 2014-10-20 NOTE — Interval H&P Note (Signed)
History and Physical Interval Note:  10/20/2014 12:12 PM  Allen Davenport  has presented today for surgery, with the diagnosis of End Stage Renal Disease N18.6  The various methods of treatment have been discussed with the patient and family. After consideration of risks, benefits and other options for treatment, the patient has consented to  Procedure(s): ARTERIOVENOUS (AV) FISTULA CREATION (Left) as a surgical intervention .  The patient's history has been reviewed, patient examined, no change in status, stable for surgery.  I have reviewed the patient's chart and labs.  Questions were answered to the patient's satisfaction.     Allen Davenport

## 2014-10-20 NOTE — Op Note (Signed)
Procedure: Left Brachial Cephalic AV fistula  Preop: ESRD  Postop: ESRD  Anesthesia: General  Assistant: Silva Bandy PA-c  Findings: 3.5 mm cephalic vein  Procedure: After obtaining informed consent, the patient was taken to the operating room.  After induction of general anesthesia, the left upper extremity was prepped and draped in usual sterile fashion.  A transverse incision was then made near the antecubital crease the left arm. The incision was carried into the subcutaneous tissues down to level of the cephalic vein. The cephalic vein was approximately 3.5 mm in diameter. It was of good quality. This was dissected free circumferentially and small side branches ligated and divided between silk ties or clips. Next the brachial artery was dissected free in the medial portion of the incision. The artery was  3-4 mm in diameter. The vessel loops were placed proximal and distal to the planned site of arteriotomy. The patient was given 7000 units of intravenous heparin. After appropriate circulation time, the vessel loops were used to control the artery. A longitudinal opening was made in the brachial artery.  The vein was ligated distally with a 2-0 silk tie. The vein was controlled proximally with a fine bulldog clamp. The vein was then swung over to the artery and sewn end of vein to side of artery using a running 7-0 Prolene suture. Just prior to completion of the anastomosis, everything was fore bled back bled and thoroughly flushed. The anastomosis was secured, vessel loops released, and there was a palpable thrill in the fistula immediately. After hemostasis was obtained, the subcutaneous tissues were reapproximated using a running 3-0 Vicryl suture. The skin was then closed with a 4 0 Vicryl subcuticular stitch. Dermabond was applied to the skin incision.  The patient had a palpable radial pulse at the end of the case.  Ruta Hinds, MD [facility] [place] Office:  [phone] Pager: [phone]

## 2014-10-20 NOTE — Anesthesia Postprocedure Evaluation (Signed)
  Anesthesia Post-op Note  Patient: Allen Davenport  Procedure(s) Performed: Procedure(s): Left Arm ARTERIOVENOUS (AV) FISTULA CREATION (Left)  Patient Location: PACU  Anesthesia Type:General  Level of Consciousness: awake  Airway and Oxygen Therapy: Patient Spontanous Breathing  Post-op Pain: mild  Post-op Assessment: Post-op Vital signs reviewed  Post-op Vital Signs: Reviewed  Last Vitals:  Filed Vitals:   10/20/14 1615  BP: 155/76  Pulse: 74  Temp: 36.2 C  Resp: 13    Complications: No apparent anesthesia complications

## 2014-10-20 NOTE — Anesthesia Preprocedure Evaluation (Signed)
Anesthesia Evaluation  Patient identified by MRN, date of birth, ID band Patient awake    Reviewed: Allergy & Precautions, NPO status , Patient's Chart, lab work & pertinent test results  Airway Mallampati: II  TM Distance: >3 FB Neck ROM: Full    Dental   Pulmonary neg pulmonary ROS,  breath sounds clear to auscultation        Cardiovascular hypertension, +CHF + Valvular Problems/Murmurs Rhythm:Regular Rate:Normal     Neuro/Psych    GI/Hepatic negative GI ROS, Neg liver ROS,   Endo/Other    Renal/GU Renal disease     Musculoskeletal   Abdominal   Peds  Hematology   Anesthesia Other Findings   Reproductive/Obstetrics                             Anesthesia Physical Anesthesia Plan  ASA: IV  Anesthesia Plan: General   Post-op Pain Management:    Induction: Intravenous  Airway Management Planned: LMA  Additional Equipment:   Intra-op Plan:   Post-operative Plan:   Informed Consent: I have reviewed the patients History and Physical, chart, labs and discussed the procedure including the risks, benefits and alternatives for the proposed anesthesia with the patient or authorized representative who has indicated his/her understanding and acceptance.   Dental advisory given  Plan Discussed with: CRNA and Anesthesiologist  Anesthesia Plan Comments:         Anesthesia Quick Evaluation

## 2014-10-20 NOTE — H&P (View-Only) (Signed)
[facility] [place] HISTORY AND PHYSICAL   History of Present Illness:  Patient is a 74 y.o. year old male who presents for placement of a permanent hemodialysis access. The patient is right handed .  The patient is not currently on hemodialysis.  The cause of renal failure is thought to be secondary to renal cancer with prior nephrectomy as well as hypertension.  Other chronic medical problems include hyperlipidemia, anemia, congestive failure all of which are currently stable..  Past Medical History  Diagnosis Date  . Hypertension   . Hyperlipidemia   . Edema   . Cancer     right renal   . Chronic kidney disease   . CHF (congestive heart failure)   . Anemia     Past Surgical History  Procedure Laterality Date  . Nephrectomy Right      Social History History  Substance Use Topics  . Smoking status: Never Smoker   . Smokeless tobacco: Never Used  . Alcohol Use: No    Family History Family History  Problem Relation Age of Onset  . Cancer Mother   . Hypertension Father   . Cancer Sister     Allergies  No Known Allergies   Current Outpatient Prescriptions  Medication Sig Dispense Refill  . amLODipine (NORVASC) 10 MG tablet Take 10 mg by mouth daily.    [name] atorvastatin (LIPITOR) 80 MG tablet Take 80 mg by mouth daily.    . bisoprolol-hydrochlorothiazide (ZIAC) 10-6.25 MG per tablet Take 1 tablet by mouth daily.    . cholecalciferol (VITAMIN D) 1000 UNITS tablet Take 1,000 Units by mouth daily.    . furosemide (LASIX) 40 MG tablet Take 40 mg by mouth.    . hydrOXYzine (ATARAX/VISTARIL) 25 MG tablet Take 25 mg by mouth 3 (three) times daily as needed.    . labetalol (NORMODYNE) 300 MG tablet Take 300 mg by mouth 2 (two) times daily.    [name] octreotide (SANDOSTATIN LAR) 30 MG injection Inject 30 mg into the muscle every 28 (twenty-eight) days.    . potassium chloride (KLOR-CON) 20 MEQ packet Take by mouth daily.    . predniSONE (DELTASONE) 10 MG  tablet Take 10 mg by mouth daily with breakfast.    . carvedilol (COREG) 3.125 MG tablet Take 3.125 mg by mouth 2 (two) times daily with a meal.    . lisinopril (PRINIVIL,ZESTRIL) 5 MG tablet Take 5 mg by mouth daily.    . sodium bicarbonate 650 MG tablet Take 650 mg by mouth daily.     No current facility-administered medications for this visit.    ROS:   General:  No weight loss, Fever, chills  HEENT: No recent headaches, no nasal bleeding, no visual changes, no sore throat  Neurologic: No dizziness, blackouts, seizures. No recent symptoms of stroke or mini- stroke. No recent episodes of slurred speech, or temporary blindness.  Cardiac: No recent episodes of chest pain/pressure, no shortness of breath at rest.  No shortness of breath with exertion.  Denies history of atrial fibrillation or irregular heartbeat  Vascular: No history of rest pain in feet.  No history of claudication.  No history of non-healing ulcer, No history of DVT   Pulmonary: No home oxygen, no productive cough, no hemoptysis,  No asthma or wheezing  Musculoskeletal:  [ ]  Arthritis, [ ]  Low back pain,  [ ]  Joint pain  Hematologic:No history of hypercoagulable state.  No history of easy bleeding.  No history of anemia  Gastrointestinal: No hematochezia or melena,  No gastroesophageal reflux, no trouble swallowing  Urinary: [x ] chronic Kidney disease, [ ]  on HD - [ ]  MWF or [ ]  TTHS, [ ]  Burning with urination, [ ]  Frequent urination, [ ]  Difficulty urinating;   Skin: No rashes  Psychological: No history of anxiety,  No history of depression   Physical Examination  Filed Vitals:   10/14/14 1507 10/14/14 1511  BP: 197/93 191/97  Pulse: 48 44  Height: 6\' 5"  (1.956 m)   Weight: 190 lb (86.183 kg)     Body mass index is 22.53 kg/(m^2).  General:  Alert and oriented, no acute distress HEENT: Normal Neck: No bruit or JVD Pulmonary: Clear to auscultation bilaterally Cardiac: Regular Rate and Rhythm  without murmur Gastrointestinal: Soft, non-tender, non-distended, no mass Skin: No rash Extremity Pulses:  2+ radial, brachial pulses bilaterally Musculoskeletal: No deformity or edema  Neurologic: Upper and lower extremity motor 5/5 and symmetric  DATA: Patient had a vein mapping ultrasound performed which I reviewed and interpreted. The cephalic vein in the left upper arm is 2.5-2.7 mm in diameter. Otherwise the cephalic vein is of poor quality bilaterally. The basilic vein is also marginal bilaterally.   ASSESSMENT:   Needs long-term hemodialysis access.   PLAN:  Left brachiocephalic AV fistula scheduled for 10/20/2014. Risks benefits possible complications and procedure details of AV fistula placement were explained the patient today. These include but are not limited to bleeding infection ischemic steal non-maturation of fistula. He understands and agrees to proceed.  Ruta Hinds, MD [facility] [place] Office: [phone] Pager: [phone]

## 2014-10-20 NOTE — Transfer of Care (Signed)
Immediate Anesthesia Transfer of Care Note  Patient: Allen Davenport  Procedure(s) Performed: Procedure(s): Left Arm ARTERIOVENOUS (AV) FISTULA CREATION (Left)  Patient Location: PACU  Anesthesia Type:General  Level of Consciousness: awake, alert , oriented and patient cooperative  Airway & Oxygen Therapy: Patient Spontanous Breathing and Patient connected to nasal cannula oxygen  Post-op Assessment: Report given to RN and Post -op Vital signs reviewed and stable  Post vital signs: Reviewed and stable  Last Vitals:  Filed Vitals:   10/20/14 1131  BP: 193/94  Pulse: 44  Temp: 36.4 C  Resp: 20    Complications: No apparent anesthesia complications

## 2014-10-21 ENCOUNTER — Telehealth: Payer: Self-pay | Admitting: Vascular Surgery

## 2014-10-21 ENCOUNTER — Encounter (HOSPITAL_COMMUNITY): Payer: Self-pay | Admitting: Vascular Surgery

## 2014-10-21 NOTE — Telephone Encounter (Signed)
-----   Message from Mena Goes, RN sent at 10/20/2014  4:10 PM EDT ----- Regarding: Schedule   ----- Message -----    From: Alvia Grove, PA-C    Sent: 10/20/2014   3:52 PM      To: [facility] Charge Pool  S/p left brachial-cephalic AV fistula [id number]  F/u with Dr. Oneida Alar in 4 weeks with duplex  Thanks Maudie Mercury

## 2014-10-21 NOTE — Telephone Encounter (Signed)
Spoke with patient to schedule appointment, dpm °

## 2014-10-22 DIAGNOSIS — N184 Chronic kidney disease, stage 4 (severe): Secondary | ICD-10-CM | POA: Diagnosis not present

## 2014-10-26 DIAGNOSIS — M810 Age-related osteoporosis without current pathological fracture: Secondary | ICD-10-CM | POA: Diagnosis not present

## 2014-10-26 DIAGNOSIS — Z79899 Other long term (current) drug therapy: Secondary | ICD-10-CM | POA: Diagnosis not present

## 2014-10-26 DIAGNOSIS — C649 Malignant neoplasm of unspecified kidney, except renal pelvis: Secondary | ICD-10-CM | POA: Diagnosis not present

## 2014-10-26 DIAGNOSIS — M85851 Other specified disorders of bone density and structure, right thigh: Secondary | ICD-10-CM | POA: Diagnosis not present

## 2014-10-26 DIAGNOSIS — E780 Pure hypercholesterolemia: Secondary | ICD-10-CM | POA: Diagnosis not present

## 2014-10-26 DIAGNOSIS — I10 Essential (primary) hypertension: Secondary | ICD-10-CM | POA: Diagnosis not present

## 2014-10-29 DIAGNOSIS — C801 Malignant (primary) neoplasm, unspecified: Secondary | ICD-10-CM | POA: Diagnosis not present

## 2014-10-29 DIAGNOSIS — C179 Malignant neoplasm of small intestine, unspecified: Secondary | ICD-10-CM | POA: Diagnosis not present

## 2014-10-29 DIAGNOSIS — N189 Chronic kidney disease, unspecified: Secondary | ICD-10-CM | POA: Diagnosis not present

## 2014-10-29 DIAGNOSIS — D631 Anemia in chronic kidney disease: Secondary | ICD-10-CM | POA: Diagnosis not present

## 2014-10-29 DIAGNOSIS — N184 Chronic kidney disease, stage 4 (severe): Secondary | ICD-10-CM | POA: Diagnosis not present

## 2014-10-29 DIAGNOSIS — C641 Malignant neoplasm of right kidney, except renal pelvis: Secondary | ICD-10-CM | POA: Diagnosis not present

## 2014-11-05 DIAGNOSIS — N184 Chronic kidney disease, stage 4 (severe): Secondary | ICD-10-CM | POA: Diagnosis not present

## 2014-11-12 DIAGNOSIS — N184 Chronic kidney disease, stage 4 (severe): Secondary | ICD-10-CM | POA: Diagnosis not present

## 2014-11-19 DIAGNOSIS — N184 Chronic kidney disease, stage 4 (severe): Secondary | ICD-10-CM | POA: Diagnosis not present

## 2014-11-19 DIAGNOSIS — R490 Dysphonia: Secondary | ICD-10-CM | POA: Diagnosis not present

## 2014-11-19 DIAGNOSIS — C320 Malignant neoplasm of glottis: Secondary | ICD-10-CM | POA: Diagnosis not present

## 2014-11-25 ENCOUNTER — Encounter: Payer: Self-pay | Admitting: Vascular Surgery

## 2014-11-26 ENCOUNTER — Encounter: Payer: Self-pay | Admitting: Vascular Surgery

## 2014-11-26 ENCOUNTER — Ambulatory Visit (INDEPENDENT_AMBULATORY_CARE_PROVIDER_SITE_OTHER): Payer: Self-pay | Admitting: Vascular Surgery

## 2014-11-26 ENCOUNTER — Ambulatory Visit (HOSPITAL_COMMUNITY)
Admission: RE | Admit: 2014-11-26 | Discharge: 2014-11-26 | Disposition: A | Discharge status: Home / Self Care | Payer: Medicare Other | Source: Ambulatory Visit | Attending: Vascular Surgery | Admitting: Vascular Surgery

## 2014-11-26 VITALS — BP 167/80 | HR 49 | Resp 18 | Ht 76.5 in | Wt 189.0 lb

## 2014-11-26 DIAGNOSIS — N186 End stage renal disease: Secondary | ICD-10-CM

## 2014-11-26 DIAGNOSIS — N184 Chronic kidney disease, stage 4 (severe): Secondary | ICD-10-CM

## 2014-11-26 DIAGNOSIS — Z4931 Encounter for adequacy testing for hemodialysis: Secondary | ICD-10-CM

## 2014-11-26 DIAGNOSIS — C179 Malignant neoplasm of small intestine, unspecified: Secondary | ICD-10-CM | POA: Diagnosis not present

## 2014-11-26 DIAGNOSIS — C801 Malignant (primary) neoplasm, unspecified: Secondary | ICD-10-CM | POA: Diagnosis not present

## 2014-11-26 DIAGNOSIS — C61 Malignant neoplasm of prostate: Secondary | ICD-10-CM | POA: Diagnosis not present

## 2014-11-26 NOTE — Progress Notes (Signed)
Patient is a 73 year old male who returns for postoperative follow-up after left brachiocephalic AV fistula on [id number]. He denies any numbness or tingling in his hand. He is currently not on hemodialysis. He is able to palpate a thrill in his fistula.  Physical exam:  Filed Vitals:   11/26/14 1532 11/26/14 1535  BP: 170/80 167/80  Pulse: 49   Resp: 18   Height: 6' 4.5" (1.943 m)   Weight: 189 lb (85.73 kg)     Left upper extremity: Well-healed antecubital incision palpable thrill audible bruit in fistula fistula was easily palpable throughout the upper arm  Assessment: Maturing fistula left arm  Plan: Fistula should be ready for cannulation in 8 weeks. Patient will follow-up on as-needed basis.  Ruta Hinds, MD [facility] [place] Office: [phone] Pager: [phone]

## 2014-11-26 NOTE — Progress Notes (Signed)
Filed Vitals:   11/26/14 1532 11/26/14 1535  BP: 170/80 167/80  Pulse: 49   Resp: 18   Height: 6' 4.5" (1.943 m)   Weight: 189 lb (85.73 kg)   Body mass index is 22.71 kg/(m^2).

## 2014-12-01 DIAGNOSIS — C801 Malignant (primary) neoplasm, unspecified: Secondary | ICD-10-CM | POA: Diagnosis not present

## 2014-12-01 DIAGNOSIS — N281 Cyst of kidney, acquired: Secondary | ICD-10-CM | POA: Diagnosis not present

## 2014-12-01 DIAGNOSIS — C641 Malignant neoplasm of right kidney, except renal pelvis: Secondary | ICD-10-CM | POA: Diagnosis not present

## 2014-12-01 DIAGNOSIS — K904 Malabsorption due to intolerance, not elsewhere classified: Secondary | ICD-10-CM | POA: Diagnosis not present

## 2014-12-01 DIAGNOSIS — D631 Anemia in chronic kidney disease: Secondary | ICD-10-CM | POA: Diagnosis not present

## 2014-12-01 DIAGNOSIS — D509 Iron deficiency anemia, unspecified: Secondary | ICD-10-CM | POA: Diagnosis not present

## 2014-12-01 DIAGNOSIS — N184 Chronic kidney disease, stage 4 (severe): Secondary | ICD-10-CM | POA: Diagnosis not present

## 2014-12-01 DIAGNOSIS — K769 Liver disease, unspecified: Secondary | ICD-10-CM | POA: Diagnosis not present

## 2014-12-02 DIAGNOSIS — C760 Malignant neoplasm of head, face and neck: Secondary | ICD-10-CM | POA: Diagnosis not present

## 2014-12-02 DIAGNOSIS — C61 Malignant neoplasm of prostate: Secondary | ICD-10-CM | POA: Diagnosis not present

## 2014-12-02 DIAGNOSIS — C801 Malignant (primary) neoplasm, unspecified: Secondary | ICD-10-CM | POA: Diagnosis not present

## 2014-12-02 DIAGNOSIS — C641 Malignant neoplasm of right kidney, except renal pelvis: Secondary | ICD-10-CM | POA: Diagnosis not present

## 2014-12-03 DIAGNOSIS — N184 Chronic kidney disease, stage 4 (severe): Secondary | ICD-10-CM | POA: Diagnosis not present

## 2014-12-07 DIAGNOSIS — C61 Malignant neoplasm of prostate: Secondary | ICD-10-CM | POA: Diagnosis not present

## 2014-12-07 DIAGNOSIS — Q600 Renal agenesis, unilateral: Secondary | ICD-10-CM | POA: Diagnosis not present

## 2014-12-10 DIAGNOSIS — Z79899 Other long term (current) drug therapy: Secondary | ICD-10-CM | POA: Diagnosis not present

## 2014-12-10 DIAGNOSIS — E559 Vitamin D deficiency, unspecified: Secondary | ICD-10-CM | POA: Diagnosis not present

## 2014-12-10 DIAGNOSIS — N184 Chronic kidney disease, stage 4 (severe): Secondary | ICD-10-CM | POA: Diagnosis not present

## 2014-12-10 DIAGNOSIS — R809 Proteinuria, unspecified: Secondary | ICD-10-CM | POA: Diagnosis not present

## 2014-12-15 DIAGNOSIS — I509 Heart failure, unspecified: Secondary | ICD-10-CM | POA: Diagnosis not present

## 2014-12-15 DIAGNOSIS — I10 Essential (primary) hypertension: Secondary | ICD-10-CM | POA: Diagnosis not present

## 2014-12-15 DIAGNOSIS — R809 Proteinuria, unspecified: Secondary | ICD-10-CM | POA: Diagnosis not present

## 2014-12-15 DIAGNOSIS — N184 Chronic kidney disease, stage 4 (severe): Secondary | ICD-10-CM | POA: Diagnosis not present

## 2014-12-17 DIAGNOSIS — E559 Vitamin D deficiency, unspecified: Secondary | ICD-10-CM | POA: Diagnosis not present

## 2014-12-17 DIAGNOSIS — M79672 Pain in left foot: Secondary | ICD-10-CM | POA: Diagnosis not present

## 2014-12-17 DIAGNOSIS — G600 Hereditary motor and sensory neuropathy: Secondary | ICD-10-CM | POA: Diagnosis not present

## 2014-12-17 DIAGNOSIS — M79671 Pain in right foot: Secondary | ICD-10-CM | POA: Diagnosis not present

## 2014-12-17 DIAGNOSIS — R809 Proteinuria, unspecified: Secondary | ICD-10-CM | POA: Diagnosis not present

## 2014-12-17 DIAGNOSIS — N184 Chronic kidney disease, stage 4 (severe): Secondary | ICD-10-CM | POA: Diagnosis not present

## 2014-12-17 DIAGNOSIS — B351 Tinea unguium: Secondary | ICD-10-CM | POA: Diagnosis not present

## 2014-12-17 DIAGNOSIS — Z79899 Other long term (current) drug therapy: Secondary | ICD-10-CM | POA: Diagnosis not present

## 2014-12-24 DIAGNOSIS — Z79899 Other long term (current) drug therapy: Secondary | ICD-10-CM | POA: Diagnosis not present

## 2014-12-24 DIAGNOSIS — C179 Malignant neoplasm of small intestine, unspecified: Secondary | ICD-10-CM | POA: Diagnosis not present

## 2014-12-24 DIAGNOSIS — E559 Vitamin D deficiency, unspecified: Secondary | ICD-10-CM | POA: Diagnosis not present

## 2014-12-24 DIAGNOSIS — N184 Chronic kidney disease, stage 4 (severe): Secondary | ICD-10-CM | POA: Diagnosis not present

## 2014-12-24 DIAGNOSIS — E340 Carcinoid syndrome: Secondary | ICD-10-CM | POA: Diagnosis not present

## 2014-12-24 DIAGNOSIS — R809 Proteinuria, unspecified: Secondary | ICD-10-CM | POA: Diagnosis not present

## 2014-12-31 DIAGNOSIS — Z79899 Other long term (current) drug therapy: Secondary | ICD-10-CM | POA: Diagnosis not present

## 2014-12-31 DIAGNOSIS — R809 Proteinuria, unspecified: Secondary | ICD-10-CM | POA: Diagnosis not present

## 2014-12-31 DIAGNOSIS — N184 Chronic kidney disease, stage 4 (severe): Secondary | ICD-10-CM | POA: Diagnosis not present

## 2014-12-31 DIAGNOSIS — E559 Vitamin D deficiency, unspecified: Secondary | ICD-10-CM | POA: Diagnosis not present

## 2015-01-01 DIAGNOSIS — I10 Essential (primary) hypertension: Secondary | ICD-10-CM | POA: Diagnosis not present

## 2015-01-01 DIAGNOSIS — Z131 Encounter for screening for diabetes mellitus: Secondary | ICD-10-CM | POA: Diagnosis not present

## 2015-01-07 DIAGNOSIS — N184 Chronic kidney disease, stage 4 (severe): Secondary | ICD-10-CM | POA: Diagnosis not present

## 2015-01-07 DIAGNOSIS — Z79899 Other long term (current) drug therapy: Secondary | ICD-10-CM | POA: Diagnosis not present

## 2015-01-07 DIAGNOSIS — D509 Iron deficiency anemia, unspecified: Secondary | ICD-10-CM | POA: Diagnosis not present

## 2015-01-07 DIAGNOSIS — E559 Vitamin D deficiency, unspecified: Secondary | ICD-10-CM | POA: Diagnosis not present

## 2015-01-07 DIAGNOSIS — R809 Proteinuria, unspecified: Secondary | ICD-10-CM | POA: Diagnosis not present

## 2015-01-14 DIAGNOSIS — E559 Vitamin D deficiency, unspecified: Secondary | ICD-10-CM | POA: Diagnosis not present

## 2015-01-14 DIAGNOSIS — N184 Chronic kidney disease, stage 4 (severe): Secondary | ICD-10-CM | POA: Diagnosis not present

## 2015-01-14 DIAGNOSIS — Z79899 Other long term (current) drug therapy: Secondary | ICD-10-CM | POA: Diagnosis not present

## 2015-01-14 DIAGNOSIS — R809 Proteinuria, unspecified: Secondary | ICD-10-CM | POA: Diagnosis not present

## 2015-01-21 DIAGNOSIS — E340 Carcinoid syndrome: Secondary | ICD-10-CM | POA: Diagnosis not present

## 2015-01-21 DIAGNOSIS — Z79899 Other long term (current) drug therapy: Secondary | ICD-10-CM | POA: Diagnosis not present

## 2015-01-21 DIAGNOSIS — E559 Vitamin D deficiency, unspecified: Secondary | ICD-10-CM | POA: Diagnosis not present

## 2015-01-21 DIAGNOSIS — N184 Chronic kidney disease, stage 4 (severe): Secondary | ICD-10-CM | POA: Diagnosis not present

## 2015-01-21 DIAGNOSIS — C179 Malignant neoplasm of small intestine, unspecified: Secondary | ICD-10-CM | POA: Diagnosis not present

## 2015-01-21 DIAGNOSIS — R809 Proteinuria, unspecified: Secondary | ICD-10-CM | POA: Diagnosis not present

## 2015-01-21 DIAGNOSIS — C641 Malignant neoplasm of right kidney, except renal pelvis: Secondary | ICD-10-CM | POA: Diagnosis not present

## 2015-01-28 DIAGNOSIS — N184 Chronic kidney disease, stage 4 (severe): Secondary | ICD-10-CM | POA: Diagnosis not present

## 2015-01-28 DIAGNOSIS — Z79899 Other long term (current) drug therapy: Secondary | ICD-10-CM | POA: Diagnosis not present

## 2015-01-28 DIAGNOSIS — R809 Proteinuria, unspecified: Secondary | ICD-10-CM | POA: Diagnosis not present

## 2015-01-28 DIAGNOSIS — E559 Vitamin D deficiency, unspecified: Secondary | ICD-10-CM | POA: Diagnosis not present

## 2015-02-04 DIAGNOSIS — E559 Vitamin D deficiency, unspecified: Secondary | ICD-10-CM | POA: Diagnosis not present

## 2015-02-04 DIAGNOSIS — R809 Proteinuria, unspecified: Secondary | ICD-10-CM | POA: Diagnosis not present

## 2015-02-04 DIAGNOSIS — N184 Chronic kidney disease, stage 4 (severe): Secondary | ICD-10-CM | POA: Diagnosis not present

## 2015-02-04 DIAGNOSIS — Z79899 Other long term (current) drug therapy: Secondary | ICD-10-CM | POA: Diagnosis not present

## 2015-02-11 DIAGNOSIS — Z79899 Other long term (current) drug therapy: Secondary | ICD-10-CM | POA: Diagnosis not present

## 2015-02-11 DIAGNOSIS — N184 Chronic kidney disease, stage 4 (severe): Secondary | ICD-10-CM | POA: Diagnosis not present

## 2015-02-11 DIAGNOSIS — E559 Vitamin D deficiency, unspecified: Secondary | ICD-10-CM | POA: Diagnosis not present

## 2015-02-11 DIAGNOSIS — R809 Proteinuria, unspecified: Secondary | ICD-10-CM | POA: Diagnosis not present

## 2015-02-18 DIAGNOSIS — D509 Iron deficiency anemia, unspecified: Secondary | ICD-10-CM | POA: Diagnosis not present

## 2015-02-18 DIAGNOSIS — K904 Malabsorption due to intolerance, not elsewhere classified: Secondary | ICD-10-CM | POA: Diagnosis not present

## 2015-02-18 DIAGNOSIS — C179 Malignant neoplasm of small intestine, unspecified: Secondary | ICD-10-CM | POA: Diagnosis not present

## 2015-02-18 DIAGNOSIS — C641 Malignant neoplasm of right kidney, except renal pelvis: Secondary | ICD-10-CM | POA: Diagnosis not present

## 2015-02-18 DIAGNOSIS — N184 Chronic kidney disease, stage 4 (severe): Secondary | ICD-10-CM | POA: Diagnosis not present

## 2015-02-18 DIAGNOSIS — E340 Carcinoid syndrome: Secondary | ICD-10-CM | POA: Diagnosis not present

## 2015-02-23 DIAGNOSIS — E872 Acidosis: Secondary | ICD-10-CM | POA: Diagnosis not present

## 2015-02-23 DIAGNOSIS — I509 Heart failure, unspecified: Secondary | ICD-10-CM | POA: Diagnosis not present

## 2015-02-23 DIAGNOSIS — D649 Anemia, unspecified: Secondary | ICD-10-CM | POA: Diagnosis not present

## 2015-02-23 DIAGNOSIS — E876 Hypokalemia: Secondary | ICD-10-CM | POA: Diagnosis not present

## 2015-02-23 DIAGNOSIS — N184 Chronic kidney disease, stage 4 (severe): Secondary | ICD-10-CM | POA: Diagnosis not present

## 2015-02-23 DIAGNOSIS — I10 Essential (primary) hypertension: Secondary | ICD-10-CM | POA: Diagnosis not present

## 2015-02-23 DIAGNOSIS — R809 Proteinuria, unspecified: Secondary | ICD-10-CM | POA: Diagnosis not present

## 2015-02-24 DIAGNOSIS — H3531 Nonexudative age-related macular degeneration: Secondary | ICD-10-CM | POA: Diagnosis not present

## 2015-02-25 DIAGNOSIS — D509 Iron deficiency anemia, unspecified: Secondary | ICD-10-CM | POA: Diagnosis not present

## 2015-02-25 DIAGNOSIS — N184 Chronic kidney disease, stage 4 (severe): Secondary | ICD-10-CM | POA: Diagnosis not present

## 2015-03-04 DIAGNOSIS — N184 Chronic kidney disease, stage 4 (severe): Secondary | ICD-10-CM | POA: Diagnosis not present

## 2015-03-04 DIAGNOSIS — D509 Iron deficiency anemia, unspecified: Secondary | ICD-10-CM | POA: Diagnosis not present

## 2015-03-11 DIAGNOSIS — D509 Iron deficiency anemia, unspecified: Secondary | ICD-10-CM | POA: Diagnosis not present

## 2015-03-11 DIAGNOSIS — N184 Chronic kidney disease, stage 4 (severe): Secondary | ICD-10-CM | POA: Diagnosis not present

## 2015-03-17 DIAGNOSIS — B351 Tinea unguium: Secondary | ICD-10-CM | POA: Diagnosis not present

## 2015-03-17 DIAGNOSIS — M79671 Pain in right foot: Secondary | ICD-10-CM | POA: Diagnosis not present

## 2015-03-17 DIAGNOSIS — M79672 Pain in left foot: Secondary | ICD-10-CM | POA: Diagnosis not present

## 2015-03-17 DIAGNOSIS — G600 Hereditary motor and sensory neuropathy: Secondary | ICD-10-CM | POA: Diagnosis not present

## 2015-03-18 DIAGNOSIS — C801 Malignant (primary) neoplasm, unspecified: Secondary | ICD-10-CM | POA: Diagnosis not present

## 2015-03-18 DIAGNOSIS — C179 Malignant neoplasm of small intestine, unspecified: Secondary | ICD-10-CM | POA: Diagnosis not present

## 2015-03-18 DIAGNOSIS — N184 Chronic kidney disease, stage 4 (severe): Secondary | ICD-10-CM | POA: Diagnosis not present

## 2015-03-18 DIAGNOSIS — C641 Malignant neoplasm of right kidney, except renal pelvis: Secondary | ICD-10-CM | POA: Diagnosis not present

## 2015-03-18 DIAGNOSIS — C760 Malignant neoplasm of head, face and neck: Secondary | ICD-10-CM | POA: Diagnosis not present

## 2015-03-18 DIAGNOSIS — R197 Diarrhea, unspecified: Secondary | ICD-10-CM | POA: Diagnosis not present

## 2015-03-25 DIAGNOSIS — N184 Chronic kidney disease, stage 4 (severe): Secondary | ICD-10-CM | POA: Diagnosis not present

## 2015-04-01 DIAGNOSIS — N184 Chronic kidney disease, stage 4 (severe): Secondary | ICD-10-CM | POA: Diagnosis not present

## 2015-04-01 DIAGNOSIS — D509 Iron deficiency anemia, unspecified: Secondary | ICD-10-CM | POA: Diagnosis not present

## 2015-04-05 DIAGNOSIS — M810 Age-related osteoporosis without current pathological fracture: Secondary | ICD-10-CM | POA: Diagnosis not present

## 2015-04-05 DIAGNOSIS — N183 Chronic kidney disease, stage 3 (moderate): Secondary | ICD-10-CM | POA: Diagnosis not present

## 2015-04-05 DIAGNOSIS — I10 Essential (primary) hypertension: Secondary | ICD-10-CM | POA: Diagnosis not present

## 2015-04-06 DIAGNOSIS — C320 Malignant neoplasm of glottis: Secondary | ICD-10-CM | POA: Diagnosis not present

## 2015-04-06 DIAGNOSIS — R490 Dysphonia: Secondary | ICD-10-CM | POA: Diagnosis not present

## 2015-04-08 DIAGNOSIS — D509 Iron deficiency anemia, unspecified: Secondary | ICD-10-CM | POA: Diagnosis not present

## 2015-04-08 DIAGNOSIS — N184 Chronic kidney disease, stage 4 (severe): Secondary | ICD-10-CM | POA: Diagnosis not present

## 2015-04-15 DIAGNOSIS — N184 Chronic kidney disease, stage 4 (severe): Secondary | ICD-10-CM | POA: Diagnosis not present

## 2015-04-15 DIAGNOSIS — C801 Malignant (primary) neoplasm, unspecified: Secondary | ICD-10-CM | POA: Diagnosis not present

## 2015-04-15 DIAGNOSIS — D509 Iron deficiency anemia, unspecified: Secondary | ICD-10-CM | POA: Diagnosis not present

## 2015-04-15 DIAGNOSIS — C179 Malignant neoplasm of small intestine, unspecified: Secondary | ICD-10-CM | POA: Diagnosis not present

## 2015-04-15 DIAGNOSIS — C641 Malignant neoplasm of right kidney, except renal pelvis: Secondary | ICD-10-CM | POA: Diagnosis not present

## 2015-04-22 DIAGNOSIS — N184 Chronic kidney disease, stage 4 (severe): Secondary | ICD-10-CM | POA: Diagnosis not present

## 2015-04-29 DIAGNOSIS — D509 Iron deficiency anemia, unspecified: Secondary | ICD-10-CM | POA: Diagnosis not present

## 2015-04-29 DIAGNOSIS — N184 Chronic kidney disease, stage 4 (severe): Secondary | ICD-10-CM | POA: Diagnosis not present

## 2015-05-06 DIAGNOSIS — N184 Chronic kidney disease, stage 4 (severe): Secondary | ICD-10-CM | POA: Diagnosis not present

## 2015-05-06 DIAGNOSIS — D509 Iron deficiency anemia, unspecified: Secondary | ICD-10-CM | POA: Diagnosis not present

## 2015-05-11 DIAGNOSIS — I10 Essential (primary) hypertension: Secondary | ICD-10-CM | POA: Diagnosis not present

## 2015-05-11 DIAGNOSIS — N184 Chronic kidney disease, stage 4 (severe): Secondary | ICD-10-CM | POA: Diagnosis not present

## 2015-05-11 DIAGNOSIS — R809 Proteinuria, unspecified: Secondary | ICD-10-CM | POA: Diagnosis not present

## 2015-05-11 DIAGNOSIS — D649 Anemia, unspecified: Secondary | ICD-10-CM | POA: Diagnosis not present

## 2015-05-13 DIAGNOSIS — C641 Malignant neoplasm of right kidney, except renal pelvis: Secondary | ICD-10-CM | POA: Diagnosis not present

## 2015-05-13 DIAGNOSIS — C801 Malignant (primary) neoplasm, unspecified: Secondary | ICD-10-CM | POA: Diagnosis not present

## 2015-05-13 DIAGNOSIS — K9049 Malabsorption due to intolerance, not elsewhere classified: Secondary | ICD-10-CM | POA: Diagnosis not present

## 2015-05-13 DIAGNOSIS — C179 Malignant neoplasm of small intestine, unspecified: Secondary | ICD-10-CM | POA: Diagnosis not present

## 2015-05-20 DIAGNOSIS — N184 Chronic kidney disease, stage 4 (severe): Secondary | ICD-10-CM | POA: Diagnosis not present

## 2015-06-02 DIAGNOSIS — M79671 Pain in right foot: Secondary | ICD-10-CM | POA: Diagnosis not present

## 2015-06-02 DIAGNOSIS — G600 Hereditary motor and sensory neuropathy: Secondary | ICD-10-CM | POA: Diagnosis not present

## 2015-06-02 DIAGNOSIS — M79672 Pain in left foot: Secondary | ICD-10-CM | POA: Diagnosis not present

## 2015-06-02 DIAGNOSIS — B351 Tinea unguium: Secondary | ICD-10-CM | POA: Diagnosis not present

## 2015-06-03 DIAGNOSIS — N184 Chronic kidney disease, stage 4 (severe): Secondary | ICD-10-CM | POA: Diagnosis not present

## 2015-06-04 DIAGNOSIS — C7989 Secondary malignant neoplasm of other specified sites: Secondary | ICD-10-CM | POA: Diagnosis not present

## 2015-06-04 DIAGNOSIS — D508 Other iron deficiency anemias: Secondary | ICD-10-CM | POA: Diagnosis not present

## 2015-06-04 DIAGNOSIS — C801 Malignant (primary) neoplasm, unspecified: Secondary | ICD-10-CM | POA: Diagnosis not present

## 2015-06-04 DIAGNOSIS — N184 Chronic kidney disease, stage 4 (severe): Secondary | ICD-10-CM | POA: Diagnosis not present

## 2015-06-04 DIAGNOSIS — E890 Postprocedural hypothyroidism: Secondary | ICD-10-CM | POA: Diagnosis not present

## 2015-06-04 DIAGNOSIS — D631 Anemia in chronic kidney disease: Secondary | ICD-10-CM | POA: Diagnosis not present

## 2015-06-04 DIAGNOSIS — C760 Malignant neoplasm of head, face and neck: Secondary | ICD-10-CM | POA: Diagnosis not present

## 2015-06-04 DIAGNOSIS — E039 Hypothyroidism, unspecified: Secondary | ICD-10-CM | POA: Diagnosis not present

## 2015-06-04 DIAGNOSIS — C179 Malignant neoplasm of small intestine, unspecified: Secondary | ICD-10-CM | POA: Diagnosis not present

## 2015-06-04 DIAGNOSIS — C641 Malignant neoplasm of right kidney, except renal pelvis: Secondary | ICD-10-CM | POA: Diagnosis not present

## 2015-06-04 DIAGNOSIS — C61 Malignant neoplasm of prostate: Secondary | ICD-10-CM | POA: Diagnosis not present

## 2015-06-04 DIAGNOSIS — D511 Vitamin B12 deficiency anemia due to selective vitamin B12 malabsorption with proteinuria: Secondary | ICD-10-CM | POA: Diagnosis not present

## 2015-06-08 DIAGNOSIS — Q600 Renal agenesis, unilateral: Secondary | ICD-10-CM | POA: Diagnosis not present

## 2015-06-08 DIAGNOSIS — C61 Malignant neoplasm of prostate: Secondary | ICD-10-CM | POA: Diagnosis not present

## 2015-06-14 DIAGNOSIS — C641 Malignant neoplasm of right kidney, except renal pelvis: Secondary | ICD-10-CM | POA: Diagnosis not present

## 2015-06-14 DIAGNOSIS — C179 Malignant neoplasm of small intestine, unspecified: Secondary | ICD-10-CM | POA: Diagnosis not present

## 2015-06-14 DIAGNOSIS — C61 Malignant neoplasm of prostate: Secondary | ICD-10-CM | POA: Diagnosis not present

## 2015-06-14 DIAGNOSIS — Q600 Renal agenesis, unilateral: Secondary | ICD-10-CM | POA: Diagnosis not present

## 2015-06-14 DIAGNOSIS — R7989 Other specified abnormal findings of blood chemistry: Secondary | ICD-10-CM | POA: Diagnosis not present

## 2015-06-14 DIAGNOSIS — C801 Malignant (primary) neoplasm, unspecified: Secondary | ICD-10-CM | POA: Diagnosis not present

## 2015-06-17 DIAGNOSIS — R809 Proteinuria, unspecified: Secondary | ICD-10-CM | POA: Diagnosis not present

## 2015-06-17 DIAGNOSIS — Z79899 Other long term (current) drug therapy: Secondary | ICD-10-CM | POA: Diagnosis not present

## 2015-06-17 DIAGNOSIS — D509 Iron deficiency anemia, unspecified: Secondary | ICD-10-CM | POA: Diagnosis not present

## 2015-06-17 DIAGNOSIS — N184 Chronic kidney disease, stage 4 (severe): Secondary | ICD-10-CM | POA: Diagnosis not present

## 2015-06-17 DIAGNOSIS — E559 Vitamin D deficiency, unspecified: Secondary | ICD-10-CM | POA: Diagnosis not present

## 2015-07-01 DIAGNOSIS — N184 Chronic kidney disease, stage 4 (severe): Secondary | ICD-10-CM | POA: Diagnosis not present

## 2015-07-01 DIAGNOSIS — D509 Iron deficiency anemia, unspecified: Secondary | ICD-10-CM | POA: Diagnosis not present

## 2015-07-01 DIAGNOSIS — R809 Proteinuria, unspecified: Secondary | ICD-10-CM | POA: Diagnosis not present

## 2015-07-01 DIAGNOSIS — Z79899 Other long term (current) drug therapy: Secondary | ICD-10-CM | POA: Diagnosis not present

## 2015-07-01 DIAGNOSIS — E559 Vitamin D deficiency, unspecified: Secondary | ICD-10-CM | POA: Diagnosis not present

## 2015-07-05 DIAGNOSIS — N183 Chronic kidney disease, stage 3 (moderate): Secondary | ICD-10-CM | POA: Diagnosis not present

## 2015-07-05 DIAGNOSIS — M810 Age-related osteoporosis without current pathological fracture: Secondary | ICD-10-CM | POA: Diagnosis not present

## 2015-07-05 DIAGNOSIS — Z131 Encounter for screening for diabetes mellitus: Secondary | ICD-10-CM | POA: Diagnosis not present

## 2015-07-05 DIAGNOSIS — I10 Essential (primary) hypertension: Secondary | ICD-10-CM | POA: Diagnosis not present

## 2015-07-13 DIAGNOSIS — C801 Malignant (primary) neoplasm, unspecified: Secondary | ICD-10-CM | POA: Diagnosis not present

## 2015-07-13 DIAGNOSIS — C760 Malignant neoplasm of head, face and neck: Secondary | ICD-10-CM | POA: Diagnosis not present

## 2015-07-13 DIAGNOSIS — K9049 Malabsorption due to intolerance, not elsewhere classified: Secondary | ICD-10-CM | POA: Diagnosis not present

## 2015-07-13 DIAGNOSIS — C179 Malignant neoplasm of small intestine, unspecified: Secondary | ICD-10-CM | POA: Diagnosis not present

## 2015-07-13 DIAGNOSIS — C641 Malignant neoplasm of right kidney, except renal pelvis: Secondary | ICD-10-CM | POA: Diagnosis not present

## 2015-07-15 DIAGNOSIS — N184 Chronic kidney disease, stage 4 (severe): Secondary | ICD-10-CM | POA: Diagnosis not present

## 2015-07-15 DIAGNOSIS — E559 Vitamin D deficiency, unspecified: Secondary | ICD-10-CM | POA: Diagnosis not present

## 2015-07-15 DIAGNOSIS — Z79899 Other long term (current) drug therapy: Secondary | ICD-10-CM | POA: Diagnosis not present

## 2015-07-15 DIAGNOSIS — R809 Proteinuria, unspecified: Secondary | ICD-10-CM | POA: Diagnosis not present

## 2015-07-27 DIAGNOSIS — I509 Heart failure, unspecified: Secondary | ICD-10-CM | POA: Diagnosis not present

## 2015-07-27 DIAGNOSIS — R809 Proteinuria, unspecified: Secondary | ICD-10-CM | POA: Diagnosis not present

## 2015-07-27 DIAGNOSIS — I10 Essential (primary) hypertension: Secondary | ICD-10-CM | POA: Diagnosis not present

## 2015-07-27 DIAGNOSIS — D649 Anemia, unspecified: Secondary | ICD-10-CM | POA: Diagnosis not present

## 2015-07-27 DIAGNOSIS — N184 Chronic kidney disease, stage 4 (severe): Secondary | ICD-10-CM | POA: Diagnosis not present

## 2015-07-29 DIAGNOSIS — N184 Chronic kidney disease, stage 4 (severe): Secondary | ICD-10-CM | POA: Diagnosis not present

## 2015-08-03 DIAGNOSIS — R490 Dysphonia: Secondary | ICD-10-CM | POA: Diagnosis not present

## 2015-08-03 DIAGNOSIS — C320 Malignant neoplasm of glottis: Secondary | ICD-10-CM | POA: Diagnosis not present

## 2015-08-10 DIAGNOSIS — C641 Malignant neoplasm of right kidney, except renal pelvis: Secondary | ICD-10-CM | POA: Diagnosis not present

## 2015-08-10 DIAGNOSIS — C801 Malignant (primary) neoplasm, unspecified: Secondary | ICD-10-CM | POA: Diagnosis not present

## 2015-08-10 DIAGNOSIS — C179 Malignant neoplasm of small intestine, unspecified: Secondary | ICD-10-CM | POA: Diagnosis not present

## 2015-08-10 DIAGNOSIS — C760 Malignant neoplasm of head, face and neck: Secondary | ICD-10-CM | POA: Diagnosis not present

## 2015-08-10 DIAGNOSIS — K9049 Malabsorption due to intolerance, not elsewhere classified: Secondary | ICD-10-CM | POA: Diagnosis not present

## 2015-08-12 DIAGNOSIS — N184 Chronic kidney disease, stage 4 (severe): Secondary | ICD-10-CM | POA: Diagnosis not present

## 2015-08-12 DIAGNOSIS — D509 Iron deficiency anemia, unspecified: Secondary | ICD-10-CM | POA: Diagnosis not present

## 2015-08-18 DIAGNOSIS — M79672 Pain in left foot: Secondary | ICD-10-CM | POA: Diagnosis not present

## 2015-08-18 DIAGNOSIS — G600 Hereditary motor and sensory neuropathy: Secondary | ICD-10-CM | POA: Diagnosis not present

## 2015-08-18 DIAGNOSIS — M79671 Pain in right foot: Secondary | ICD-10-CM | POA: Diagnosis not present

## 2015-08-18 DIAGNOSIS — B351 Tinea unguium: Secondary | ICD-10-CM | POA: Diagnosis not present

## 2015-08-26 DIAGNOSIS — N184 Chronic kidney disease, stage 4 (severe): Secondary | ICD-10-CM | POA: Diagnosis not present

## 2015-09-06 DIAGNOSIS — C179 Malignant neoplasm of small intestine, unspecified: Secondary | ICD-10-CM | POA: Diagnosis not present

## 2015-09-06 DIAGNOSIS — C641 Malignant neoplasm of right kidney, except renal pelvis: Secondary | ICD-10-CM | POA: Diagnosis not present

## 2015-09-06 DIAGNOSIS — C801 Malignant (primary) neoplasm, unspecified: Secondary | ICD-10-CM | POA: Diagnosis not present

## 2015-09-09 DIAGNOSIS — N184 Chronic kidney disease, stage 4 (severe): Secondary | ICD-10-CM | POA: Diagnosis not present

## 2015-09-23 DIAGNOSIS — N184 Chronic kidney disease, stage 4 (severe): Secondary | ICD-10-CM | POA: Diagnosis not present

## 2015-09-23 DIAGNOSIS — D509 Iron deficiency anemia, unspecified: Secondary | ICD-10-CM | POA: Diagnosis not present

## 2015-09-30 DIAGNOSIS — C801 Malignant (primary) neoplasm, unspecified: Secondary | ICD-10-CM | POA: Diagnosis not present

## 2015-09-30 DIAGNOSIS — C7989 Secondary malignant neoplasm of other specified sites: Secondary | ICD-10-CM | POA: Diagnosis not present

## 2015-09-30 DIAGNOSIS — D511 Vitamin B12 deficiency anemia due to selective vitamin B12 malabsorption with proteinuria: Secondary | ICD-10-CM | POA: Diagnosis not present

## 2015-09-30 DIAGNOSIS — E039 Hypothyroidism, unspecified: Secondary | ICD-10-CM | POA: Diagnosis not present

## 2015-09-30 DIAGNOSIS — D508 Other iron deficiency anemias: Secondary | ICD-10-CM | POA: Diagnosis not present

## 2015-09-30 DIAGNOSIS — N189 Chronic kidney disease, unspecified: Secondary | ICD-10-CM | POA: Diagnosis not present

## 2015-09-30 DIAGNOSIS — D631 Anemia in chronic kidney disease: Secondary | ICD-10-CM | POA: Diagnosis not present

## 2015-09-30 DIAGNOSIS — Z8589 Personal history of malignant neoplasm of other organs and systems: Secondary | ICD-10-CM | POA: Diagnosis not present

## 2015-09-30 DIAGNOSIS — Z8546 Personal history of malignant neoplasm of prostate: Secondary | ICD-10-CM | POA: Diagnosis not present

## 2015-10-05 DIAGNOSIS — C179 Malignant neoplasm of small intestine, unspecified: Secondary | ICD-10-CM | POA: Diagnosis not present

## 2015-10-05 DIAGNOSIS — C641 Malignant neoplasm of right kidney, except renal pelvis: Secondary | ICD-10-CM | POA: Diagnosis not present

## 2015-10-07 DIAGNOSIS — Z131 Encounter for screening for diabetes mellitus: Secondary | ICD-10-CM | POA: Diagnosis not present

## 2015-10-07 DIAGNOSIS — I10 Essential (primary) hypertension: Secondary | ICD-10-CM | POA: Diagnosis not present

## 2015-10-07 DIAGNOSIS — D509 Iron deficiency anemia, unspecified: Secondary | ICD-10-CM | POA: Diagnosis not present

## 2015-10-07 DIAGNOSIS — N184 Chronic kidney disease, stage 4 (severe): Secondary | ICD-10-CM | POA: Diagnosis not present

## 2015-10-07 DIAGNOSIS — Z0000 Encounter for general adult medical examination without abnormal findings: Secondary | ICD-10-CM | POA: Diagnosis not present

## 2015-10-07 DIAGNOSIS — Z1389 Encounter for screening for other disorder: Secondary | ICD-10-CM | POA: Diagnosis not present

## 2015-10-07 DIAGNOSIS — M818 Other osteoporosis without current pathological fracture: Secondary | ICD-10-CM | POA: Diagnosis not present

## 2015-10-12 DIAGNOSIS — D638 Anemia in other chronic diseases classified elsewhere: Secondary | ICD-10-CM | POA: Diagnosis not present

## 2015-10-12 DIAGNOSIS — I10 Essential (primary) hypertension: Secondary | ICD-10-CM | POA: Diagnosis not present

## 2015-10-12 DIAGNOSIS — N184 Chronic kidney disease, stage 4 (severe): Secondary | ICD-10-CM | POA: Diagnosis not present

## 2015-10-12 DIAGNOSIS — R809 Proteinuria, unspecified: Secondary | ICD-10-CM | POA: Diagnosis not present

## 2015-10-28 DIAGNOSIS — N184 Chronic kidney disease, stage 4 (severe): Secondary | ICD-10-CM | POA: Diagnosis not present

## 2015-10-28 DIAGNOSIS — D509 Iron deficiency anemia, unspecified: Secondary | ICD-10-CM | POA: Diagnosis not present

## 2015-11-02 DIAGNOSIS — D3A00 Benign carcinoid tumor of unspecified site: Secondary | ICD-10-CM | POA: Diagnosis not present

## 2015-11-02 DIAGNOSIS — C641 Malignant neoplasm of right kidney, except renal pelvis: Secondary | ICD-10-CM | POA: Diagnosis not present

## 2015-11-02 DIAGNOSIS — C179 Malignant neoplasm of small intestine, unspecified: Secondary | ICD-10-CM | POA: Diagnosis not present

## 2015-11-03 DIAGNOSIS — B351 Tinea unguium: Secondary | ICD-10-CM | POA: Diagnosis not present

## 2015-11-03 DIAGNOSIS — M79672 Pain in left foot: Secondary | ICD-10-CM | POA: Diagnosis not present

## 2015-11-03 DIAGNOSIS — M79671 Pain in right foot: Secondary | ICD-10-CM | POA: Diagnosis not present

## 2015-11-03 DIAGNOSIS — G600 Hereditary motor and sensory neuropathy: Secondary | ICD-10-CM | POA: Diagnosis not present

## 2015-11-18 DIAGNOSIS — R809 Proteinuria, unspecified: Secondary | ICD-10-CM | POA: Diagnosis not present

## 2015-11-18 DIAGNOSIS — N184 Chronic kidney disease, stage 4 (severe): Secondary | ICD-10-CM | POA: Diagnosis not present

## 2015-11-18 DIAGNOSIS — E559 Vitamin D deficiency, unspecified: Secondary | ICD-10-CM | POA: Diagnosis not present

## 2015-11-18 DIAGNOSIS — Z79899 Other long term (current) drug therapy: Secondary | ICD-10-CM | POA: Diagnosis not present

## 2015-12-02 DIAGNOSIS — C179 Malignant neoplasm of small intestine, unspecified: Secondary | ICD-10-CM | POA: Diagnosis not present

## 2015-12-02 DIAGNOSIS — C641 Malignant neoplasm of right kidney, except renal pelvis: Secondary | ICD-10-CM | POA: Diagnosis not present

## 2015-12-02 DIAGNOSIS — D3A00 Benign carcinoid tumor of unspecified site: Secondary | ICD-10-CM | POA: Diagnosis not present

## 2015-12-03 DIAGNOSIS — C320 Malignant neoplasm of glottis: Secondary | ICD-10-CM | POA: Diagnosis not present

## 2015-12-03 DIAGNOSIS — R490 Dysphonia: Secondary | ICD-10-CM | POA: Diagnosis not present

## 2015-12-08 DIAGNOSIS — C61 Malignant neoplasm of prostate: Secondary | ICD-10-CM | POA: Diagnosis not present

## 2015-12-09 DIAGNOSIS — E559 Vitamin D deficiency, unspecified: Secondary | ICD-10-CM | POA: Diagnosis not present

## 2015-12-09 DIAGNOSIS — R809 Proteinuria, unspecified: Secondary | ICD-10-CM | POA: Diagnosis not present

## 2015-12-09 DIAGNOSIS — Z79899 Other long term (current) drug therapy: Secondary | ICD-10-CM | POA: Diagnosis not present

## 2015-12-09 DIAGNOSIS — D509 Iron deficiency anemia, unspecified: Secondary | ICD-10-CM | POA: Diagnosis not present

## 2015-12-09 DIAGNOSIS — N184 Chronic kidney disease, stage 4 (severe): Secondary | ICD-10-CM | POA: Diagnosis not present

## 2015-12-14 DIAGNOSIS — Q600 Renal agenesis, unilateral: Secondary | ICD-10-CM | POA: Diagnosis not present

## 2015-12-14 DIAGNOSIS — R7989 Other specified abnormal findings of blood chemistry: Secondary | ICD-10-CM | POA: Diagnosis not present

## 2015-12-14 DIAGNOSIS — C61 Malignant neoplasm of prostate: Secondary | ICD-10-CM | POA: Diagnosis not present

## 2015-12-30 DIAGNOSIS — K9049 Malabsorption due to intolerance, not elsewhere classified: Secondary | ICD-10-CM | POA: Diagnosis not present

## 2015-12-30 DIAGNOSIS — R809 Proteinuria, unspecified: Secondary | ICD-10-CM | POA: Diagnosis not present

## 2015-12-30 DIAGNOSIS — N184 Chronic kidney disease, stage 4 (severe): Secondary | ICD-10-CM | POA: Diagnosis not present

## 2015-12-30 DIAGNOSIS — C641 Malignant neoplasm of right kidney, except renal pelvis: Secondary | ICD-10-CM | POA: Diagnosis not present

## 2015-12-30 DIAGNOSIS — Z79899 Other long term (current) drug therapy: Secondary | ICD-10-CM | POA: Diagnosis not present

## 2015-12-30 DIAGNOSIS — C760 Malignant neoplasm of head, face and neck: Secondary | ICD-10-CM | POA: Diagnosis not present

## 2015-12-30 DIAGNOSIS — C179 Malignant neoplasm of small intestine, unspecified: Secondary | ICD-10-CM | POA: Diagnosis not present

## 2015-12-30 DIAGNOSIS — E559 Vitamin D deficiency, unspecified: Secondary | ICD-10-CM | POA: Diagnosis not present

## 2015-12-30 DIAGNOSIS — D3A00 Benign carcinoid tumor of unspecified site: Secondary | ICD-10-CM | POA: Diagnosis not present

## 2015-12-30 DIAGNOSIS — C649 Malignant neoplasm of unspecified kidney, except renal pelvis: Secondary | ICD-10-CM | POA: Diagnosis not present

## 2016-01-03 ENCOUNTER — Other Ambulatory Visit (HOSPITAL_COMMUNITY): Payer: Self-pay | Admitting: Oncology

## 2016-01-03 ENCOUNTER — Encounter (HOSPITAL_COMMUNITY): Payer: Self-pay | Admitting: Oncology

## 2016-01-03 DIAGNOSIS — C7A00 Malignant carcinoid tumor of unspecified site: Secondary | ICD-10-CM

## 2016-01-03 HISTORY — DX: Malignant carcinoid tumor of unspecified site: C7A.00

## 2016-01-07 DIAGNOSIS — M818 Other osteoporosis without current pathological fracture: Secondary | ICD-10-CM | POA: Diagnosis not present

## 2016-01-07 DIAGNOSIS — I10 Essential (primary) hypertension: Secondary | ICD-10-CM | POA: Diagnosis not present

## 2016-01-12 DIAGNOSIS — E1151 Type 2 diabetes mellitus with diabetic peripheral angiopathy without gangrene: Secondary | ICD-10-CM | POA: Diagnosis not present

## 2016-01-12 DIAGNOSIS — E1140 Type 2 diabetes mellitus with diabetic neuropathy, unspecified: Secondary | ICD-10-CM | POA: Diagnosis not present

## 2016-01-17 DIAGNOSIS — M818 Other osteoporosis without current pathological fracture: Secondary | ICD-10-CM | POA: Diagnosis not present

## 2016-01-17 DIAGNOSIS — N183 Chronic kidney disease, stage 3 (moderate): Secondary | ICD-10-CM | POA: Diagnosis not present

## 2016-01-17 DIAGNOSIS — I5032 Chronic diastolic (congestive) heart failure: Secondary | ICD-10-CM | POA: Diagnosis not present

## 2016-01-17 DIAGNOSIS — I10 Essential (primary) hypertension: Secondary | ICD-10-CM | POA: Diagnosis not present

## 2016-01-27 ENCOUNTER — Encounter (HOSPITAL_COMMUNITY): Payer: Medicare Other | Attending: Hematology & Oncology

## 2016-01-27 ENCOUNTER — Encounter (HOSPITAL_COMMUNITY): Payer: Medicare Other

## 2016-01-27 VITALS — BP 120/64 | HR 75 | Temp 97.7°F | Resp 16

## 2016-01-27 DIAGNOSIS — N184 Chronic kidney disease, stage 4 (severe): Secondary | ICD-10-CM | POA: Diagnosis not present

## 2016-01-27 DIAGNOSIS — C7A00 Malignant carcinoid tumor of unspecified site: Secondary | ICD-10-CM

## 2016-01-27 LAB — CBC WITH DIFFERENTIAL/PLATELET
BASOS PCT: 1 %
Basophils Absolute: 0 10*3/uL (ref 0.0–0.1)
EOS ABS: 0.1 10*3/uL (ref 0.0–0.7)
EOS PCT: 4 %
HCT: 30.1 % — ABNORMAL LOW (ref 39.0–52.0)
Hemoglobin: 10 g/dL — ABNORMAL LOW (ref 13.0–17.0)
Lymphocytes Relative: 11 %
Lymphs Abs: 0.4 10*3/uL — ABNORMAL LOW (ref 0.7–4.0)
MCH: 30.9 pg (ref 26.0–34.0)
MCHC: 33.2 g/dL (ref 30.0–36.0)
MCV: 92.9 fL (ref 78.0–100.0)
MONO ABS: 0.4 10*3/uL (ref 0.1–1.0)
MONOS PCT: 10 %
Neutro Abs: 2.7 10*3/uL (ref 1.7–7.7)
Neutrophils Relative %: 75 %
Platelets: 106 10*3/uL — ABNORMAL LOW (ref 150–400)
RBC: 3.24 MIL/uL — ABNORMAL LOW (ref 4.22–5.81)
RDW: 13.8 % (ref 11.5–15.5)
WBC: 3.7 10*3/uL — ABNORMAL LOW (ref 4.0–10.5)

## 2016-01-27 LAB — COMPREHENSIVE METABOLIC PANEL
ALBUMIN: 3.6 g/dL (ref 3.5–5.0)
ALT: 23 U/L (ref 17–63)
ANION GAP: 6 (ref 5–15)
AST: 49 U/L — ABNORMAL HIGH (ref 15–41)
Alkaline Phosphatase: 46 U/L (ref 38–126)
BILIRUBIN TOTAL: 0.7 mg/dL (ref 0.3–1.2)
BUN: 60 mg/dL — ABNORMAL HIGH (ref 6–20)
CO2: 24 mmol/L (ref 22–32)
Calcium: 8.6 mg/dL — ABNORMAL LOW (ref 8.9–10.3)
Chloride: 105 mmol/L (ref 101–111)
Creatinine, Ser: 4.46 mg/dL — ABNORMAL HIGH (ref 0.61–1.24)
GFR calc Af Amer: 14 mL/min — ABNORMAL LOW (ref >60)
GFR calc non Af Amer: 12 mL/min — ABNORMAL LOW (ref >60)
Glucose, Bld: 100 mg/dL — ABNORMAL HIGH (ref 65–99)
POTASSIUM: 4.7 mmol/L (ref 3.5–5.1)
Sodium: 135 mmol/L (ref 135–145)
TOTAL PROTEIN: 6.2 g/dL — AB (ref 6.5–8.1)

## 2016-01-27 MED ORDER — OCTREOTIDE ACETATE 30 MG IM KIT
30.0000 mg | PACK | Freq: Once | INTRAMUSCULAR | Status: AC
  Administered 2016-01-27: 30 mg via INTRAMUSCULAR
  Filled 2016-01-27: qty 1

## 2016-01-27 NOTE — Patient Instructions (Signed)
[facility] at [facility] Discharge Instructions  RECOMMENDATIONS MADE BY THE CONSULTANT AND ANY TEST RESULTS WILL BE SENT TO YOUR REFERRING PHYSICIAN.  Sandostatin 30 mg injection given today as ordered. Return as scheduled.  Thank you for choosing [place] at [facility] to provide your oncology and hematology care.  To afford each patient quality time with our provider, please arrive at least 15 minutes before your scheduled appointment time.   Beginning January 23rd 2017 lab work for the [name] will be done in the  Main lab at [facility] on 1st floor. If you have a lab appointment with the [facility] please come in thru the  Main Entrance and check in at the main information desk  You need to re-schedule your appointment should you arrive 10 or more minutes late.  We strive to give you quality time with our providers, and arriving late affects you and other patients whose appointments are after yours.  Also, if you no show three or more times for appointments you may be dismissed from the clinic at the providers discretion.     Again, thank you for choosing [facility].  Our hope is that these requests will decrease the amount of time that you wait before being seen by our physicians.       _____________________________________________________________  Should you have questions after your visit to [facility], please contact our office at [phone] between the hours of 8:30 a.m. and 4:30 p.m.  Voicemails left after 4:30 p.m. will not be returned until the following business day.  For prescription refill requests, have your pharmacy contact our office.         Resources For Cancer Patients and their Caregivers ? American Cancer Society: Can assist with transportation, wigs, general needs, runs Look Good Feel Better.        380-222-3535 ? Cancer Care: Provides financial assistance, online  support groups, medication/co-pay assistance.  1-800-813-HOPE 8587819039) ? Early Assists [place] cancer patients and their families through emotional , educational and financial support.  301-215-2500 ? [place] DSS Where to apply for food stamps, Medicaid and utility assistance. 859-830-7719 ? [facility]: Transportation to medical appointments. (904) 552-5954 ? Social Security Administration: May apply for disability if have a Stage IV cancer. 581-430-5139 743-494-4500 ? [employer], Disability and Transit Services: Assists with nutrition, care and transit needs. [employer] Support Programs: @10RELATIVEDAYS @ > Cancer Support Group  2nd Tuesday of the month 1pm-2pm, Journey Room  > Creative Journey  3rd Tuesday of the month 1130am-1pm, Journey Room  > Look Good Feel Better  1st Wednesday of the month 10am-12 noon, Journey Room (Call [place] to register [phone])

## 2016-01-27 NOTE — Progress Notes (Signed)
Cowan A Ibe presents today for injection per MD orders. Sandostatin 30 mg administered IM in right upper outer Gluteal. Administration without incident. Patient tolerated well.

## 2016-01-28 LAB — CHROMOGRANIN A: CHROMOGRANIN A: 14 nmol/L — AB (ref 0–5)

## 2016-01-31 ENCOUNTER — Other Ambulatory Visit (HOSPITAL_COMMUNITY)
Admission: RE | Admit: 2016-01-31 | Discharge: 2016-01-31 | Disposition: A | Discharge status: Home / Self Care | Payer: Medicare Other | Source: Ambulatory Visit | Attending: Hematology & Oncology | Admitting: Hematology & Oncology

## 2016-01-31 DIAGNOSIS — C7A00 Malignant carcinoid tumor of unspecified site: Secondary | ICD-10-CM | POA: Insufficient documentation

## 2016-01-31 LAB — SEROTONIN SERUM: Serotonin, Serum: 277 ng/mL (ref 21–321)

## 2016-02-04 LAB — 5 HIAA, QUANTITATIVE, URINE, 24 HOUR
5 HIAA UR: 6.6 mg/L
5-HIAA,Quant.,24 Hr Urine: 12.4 mg/24 hr (ref 0.0–14.9)
Total Volume: 1875

## 2016-02-14 ENCOUNTER — Other Ambulatory Visit (HOSPITAL_COMMUNITY): Payer: Self-pay | Admitting: Hematology & Oncology

## 2016-02-21 DIAGNOSIS — N183 Chronic kidney disease, stage 3 (moderate): Secondary | ICD-10-CM | POA: Diagnosis not present

## 2016-02-21 DIAGNOSIS — I5032 Chronic diastolic (congestive) heart failure: Secondary | ICD-10-CM | POA: Diagnosis not present

## 2016-02-21 DIAGNOSIS — M818 Other osteoporosis without current pathological fracture: Secondary | ICD-10-CM | POA: Diagnosis not present

## 2016-02-21 DIAGNOSIS — I10 Essential (primary) hypertension: Secondary | ICD-10-CM | POA: Diagnosis not present

## 2016-02-24 ENCOUNTER — Ambulatory Visit (HOSPITAL_COMMUNITY): Payer: Medicare Other | Admitting: Hematology & Oncology

## 2016-02-24 ENCOUNTER — Encounter (HOSPITAL_COMMUNITY): Payer: Medicare Other | Attending: Oncology

## 2016-02-24 ENCOUNTER — Encounter (HOSPITAL_COMMUNITY): Payer: Medicare Other

## 2016-02-24 VITALS — BP 116/58 | HR 55 | Temp 97.4°F | Resp 18 | Wt 201.0 lb

## 2016-02-24 DIAGNOSIS — C7A00 Malignant carcinoid tumor of unspecified site: Secondary | ICD-10-CM | POA: Diagnosis not present

## 2016-02-24 DIAGNOSIS — N184 Chronic kidney disease, stage 4 (severe): Secondary | ICD-10-CM | POA: Diagnosis not present

## 2016-02-24 DIAGNOSIS — D509 Iron deficiency anemia, unspecified: Secondary | ICD-10-CM | POA: Diagnosis not present

## 2016-02-24 LAB — COMPREHENSIVE METABOLIC PANEL
ALT: 21 U/L (ref 17–63)
ANION GAP: 6 (ref 5–15)
AST: 38 U/L (ref 15–41)
Albumin: 3.6 g/dL (ref 3.5–5.0)
Alkaline Phosphatase: 48 U/L (ref 38–126)
BILIRUBIN TOTAL: 0.6 mg/dL (ref 0.3–1.2)
BUN: 59 mg/dL — ABNORMAL HIGH (ref 6–20)
CALCIUM: 8.4 mg/dL — AB (ref 8.9–10.3)
CO2: 24 mmol/L (ref 22–32)
CREATININE: 4.25 mg/dL — AB (ref 0.61–1.24)
Chloride: 104 mmol/L (ref 101–111)
GFR calc Af Amer: 14 mL/min — ABNORMAL LOW (ref >60)
GFR calc non Af Amer: 12 mL/min — ABNORMAL LOW (ref >60)
Glucose, Bld: 132 mg/dL — ABNORMAL HIGH (ref 65–99)
Potassium: 4.3 mmol/L (ref 3.5–5.1)
Sodium: 134 mmol/L — ABNORMAL LOW (ref 135–145)
TOTAL PROTEIN: 6 g/dL — AB (ref 6.5–8.1)

## 2016-02-24 LAB — CBC WITH DIFFERENTIAL/PLATELET
Basophils Absolute: 0 10*3/uL (ref 0.0–0.1)
Basophils Relative: 1 %
EOS ABS: 0.1 10*3/uL (ref 0.0–0.7)
EOS PCT: 3 %
HEMATOCRIT: 31.4 % — AB (ref 39.0–52.0)
HEMOGLOBIN: 10.5 g/dL — AB (ref 13.0–17.0)
LYMPHS ABS: 0.5 10*3/uL — AB (ref 0.7–4.0)
Lymphocytes Relative: 13 %
MCH: 30.7 pg (ref 26.0–34.0)
MCHC: 33.4 g/dL (ref 30.0–36.0)
MCV: 91.8 fL (ref 78.0–100.0)
MONO ABS: 0.3 10*3/uL (ref 0.1–1.0)
Monocytes Relative: 8 %
Neutro Abs: 3.2 10*3/uL (ref 1.7–7.7)
Neutrophils Relative %: 76 %
Platelets: 107 10*3/uL — ABNORMAL LOW (ref 150–400)
RBC: 3.42 MIL/uL — AB (ref 4.22–5.81)
RDW: 13.7 % (ref 11.5–15.5)
WBC: 4.2 10*3/uL (ref 4.0–10.5)

## 2016-02-24 MED ORDER — OCTREOTIDE ACETATE 30 MG IM KIT
30.0000 mg | PACK | Freq: Once | INTRAMUSCULAR | Status: AC
  Administered 2016-02-24: 30 mg via INTRAMUSCULAR

## 2016-02-24 MED ORDER — OCTREOTIDE ACETATE 30 MG IM KIT
PACK | INTRAMUSCULAR | Status: AC
  Filled 2016-02-24: qty 1

## 2016-02-24 NOTE — Progress Notes (Signed)
Allen Davenport presents today for injection per MD orders. Sandostatin administered IM in left Gluteal. Administration without incident. Patient tolerated well.  

## 2016-02-24 NOTE — Patient Instructions (Signed)
[place] at [facility] Discharge Instructions  RECOMMENDATIONS MADE BY THE CONSULTANT AND ANY TEST RESULTS WILL BE SENT TO YOUR REFERRING PHYSICIAN.  Sandostatin today.    Thank you for choosing [place] at [facility] to provide your oncology and hematology care.  To afford each patient quality time with our provider, please arrive at least 15 minutes before your scheduled appointment time.   Beginning January 23rd 2017 lab work for the [name] will be done in the  Main lab at [facility] on 1st floor. If you have a lab appointment with the [facility] please come in thru the  Main Entrance and check in at the main information desk  You need to re-schedule your appointment should you arrive 10 or more minutes late.  We strive to give you quality time with our providers, and arriving late affects you and other patients whose appointments are after yours.  Also, if you no show three or more times for appointments you may be dismissed from the clinic at the providers discretion.     Again, thank you for choosing [facility].  Our hope is that these requests will decrease the amount of time that you wait before being seen by our physicians.       _____________________________________________________________  Should you have questions after your visit to [facility], please contact our office at [phone] between the hours of 8:30 a.m. and 4:30 p.m.  Voicemails left after 4:30 p.m. will not be returned until the following business day.  For prescription refill requests, have your pharmacy contact our office.         Resources For Cancer Patients and their Caregivers ? American Cancer Society: Can assist with transportation, wigs, general needs, runs Look Good Feel Better.        (623)283-2874 ? Cancer Care: Provides financial assistance, online support groups, medication/co-pay assistance.   1-800-813-HOPE 214-881-7841) ? [place] Assists [place] cancer patients and their families through emotional , educational and financial support.  8255247977 ? [place] DSS Where to apply for food stamps, Medicaid and utility assistance. 315-317-5256 ? RCATS: Transportation to medical appointments. 7757620344 ? Social Security Administration: May apply for disability if have a Stage IV cancer. (847)725-0038 (906)167-1518 ? [employer], Disability and Transit Services: Assists with nutrition, care and transit needs. [place] Support Programs: @10RELATIVEDAYS @ > Cancer Support Group  2nd Tuesday of the month 1pm-2pm, Journey Room  > Creative Journey  3rd Tuesday of the month 1130am-1pm, Journey Room  > Look Good Feel Better  1st Wednesday of the month 10am-12 noon, Journey Room (Call [place] to register [phone])

## 2016-02-25 LAB — CHROMOGRANIN A: Chromogranin A: 15 nmol/L — ABNORMAL HIGH (ref 0–5)

## 2016-02-28 LAB — SEROTONIN SERUM: Serotonin, Serum: 273 ng/mL (ref 21–321)

## 2016-03-21 DIAGNOSIS — I5032 Chronic diastolic (congestive) heart failure: Secondary | ICD-10-CM | POA: Diagnosis not present

## 2016-03-21 DIAGNOSIS — M818 Other osteoporosis without current pathological fracture: Secondary | ICD-10-CM | POA: Diagnosis not present

## 2016-03-21 DIAGNOSIS — I10 Essential (primary) hypertension: Secondary | ICD-10-CM | POA: Diagnosis not present

## 2016-03-21 DIAGNOSIS — N183 Chronic kidney disease, stage 3 (moderate): Secondary | ICD-10-CM | POA: Diagnosis not present

## 2016-03-22 DIAGNOSIS — E1151 Type 2 diabetes mellitus with diabetic peripheral angiopathy without gangrene: Secondary | ICD-10-CM | POA: Diagnosis not present

## 2016-03-22 DIAGNOSIS — E1140 Type 2 diabetes mellitus with diabetic neuropathy, unspecified: Secondary | ICD-10-CM | POA: Diagnosis not present

## 2016-03-23 DIAGNOSIS — R809 Proteinuria, unspecified: Secondary | ICD-10-CM | POA: Diagnosis not present

## 2016-03-23 DIAGNOSIS — D509 Iron deficiency anemia, unspecified: Secondary | ICD-10-CM | POA: Diagnosis not present

## 2016-03-23 DIAGNOSIS — N184 Chronic kidney disease, stage 4 (severe): Secondary | ICD-10-CM | POA: Diagnosis not present

## 2016-03-23 DIAGNOSIS — Z79899 Other long term (current) drug therapy: Secondary | ICD-10-CM | POA: Diagnosis not present

## 2016-03-23 DIAGNOSIS — E559 Vitamin D deficiency, unspecified: Secondary | ICD-10-CM | POA: Diagnosis not present

## 2016-03-24 ENCOUNTER — Encounter (HOSPITAL_COMMUNITY): Payer: Medicare Other | Attending: Hematology & Oncology

## 2016-03-24 VITALS — BP 123/60 | HR 53 | Temp 98.2°F | Resp 18

## 2016-03-24 DIAGNOSIS — C7A00 Malignant carcinoid tumor of unspecified site: Secondary | ICD-10-CM | POA: Insufficient documentation

## 2016-03-24 MED ORDER — OCTREOTIDE ACETATE 30 MG IM KIT
30.0000 mg | PACK | Freq: Once | INTRAMUSCULAR | Status: AC
  Administered 2016-03-24: 30 mg via INTRAMUSCULAR
  Filled 2016-03-24: qty 1

## 2016-03-24 NOTE — Progress Notes (Signed)
Allen Davenport tolerated Sandostatin injection well without complaints. Pt discharged self ambulatory in satisfactory condition

## 2016-03-24 NOTE — Patient Instructions (Signed)
[place] at [facility] Discharge Instructions  RECOMMENDATIONS MADE BY THE CONSULTANT AND ANY TEST RESULTS WILL BE SENT TO YOUR REFERRING PHYSICIAN.  Received Sandostatin injection today. Follow-up as scheduled. Call clinic for any questions or concerns  Thank you for choosing [place] at [facility] to provide your oncology and hematology care.  To afford each patient quality time with our provider, please arrive at least 15 minutes before your scheduled appointment time.   Beginning January 23rd 2017 lab work for the [name] will be done in the  Main lab at [facility] on 1st floor. If you have a lab appointment with the [facility] please come in thru the  Main Entrance and check in at the main information desk  You need to re-schedule your appointment should you arrive 10 or more minutes late.  We strive to give you quality time with our providers, and arriving late affects you and other patients whose appointments are after yours.  Also, if you no show three or more times for appointments you may be dismissed from the clinic at the providers discretion.     Again, thank you for choosing [facility].  Our hope is that these requests will decrease the amount of time that you wait before being seen by our physicians.       _____________________________________________________________  Should you have questions after your visit to [facility], please contact our office at [phone] between the hours of 8:30 a.m. and 4:30 p.m.  Voicemails left after 4:30 p.m. will not be returned until the following business day.  For prescription refill requests, have your pharmacy contact our office.         Resources For Cancer Patients and their Caregivers ? American Cancer Society: Can assist with transportation, wigs, general needs, runs Look Good Feel Better.        605-235-2140 ? Cancer Care: Provides  financial assistance, online support groups, medication/co-pay assistance.  1-800-813-HOPE 760-009-0153) ? [place] Assists [place] cancer patients and their families through emotional , educational and financial support.  6715626425 ? [place] DSS Where to apply for food stamps, Medicaid and utility assistance. 639 581 3641 ? RCATS: Transportation to medical appointments. 671-763-5636 ? Social Security Administration: May apply for disability if have a Stage IV cancer. (607) 307-4364 (217) 117-0386 ? [employer], Disability and Transit Services: Assists with nutrition, care and transit needs. [place] Support Programs: @10RELATIVEDAYS @ > Cancer Support Group  2nd Tuesday of the month 1pm-2pm, Journey Room  > Creative Journey  3rd Tuesday of the month 1130am-1pm, Journey Room  > Look Good Feel Better  1st Wednesday of the month 10am-12 noon, Journey Room (Call [place] to register [phone])

## 2016-03-28 DIAGNOSIS — D638 Anemia in other chronic diseases classified elsewhere: Secondary | ICD-10-CM | POA: Diagnosis not present

## 2016-03-28 DIAGNOSIS — R809 Proteinuria, unspecified: Secondary | ICD-10-CM | POA: Diagnosis not present

## 2016-03-28 DIAGNOSIS — E872 Acidosis: Secondary | ICD-10-CM | POA: Diagnosis not present

## 2016-03-28 DIAGNOSIS — I509 Heart failure, unspecified: Secondary | ICD-10-CM | POA: Diagnosis not present

## 2016-03-28 DIAGNOSIS — N184 Chronic kidney disease, stage 4 (severe): Secondary | ICD-10-CM | POA: Diagnosis not present

## 2016-03-28 DIAGNOSIS — I10 Essential (primary) hypertension: Secondary | ICD-10-CM | POA: Diagnosis not present

## 2016-04-12 DIAGNOSIS — D122 Benign neoplasm of ascending colon: Secondary | ICD-10-CM | POA: Diagnosis not present

## 2016-04-12 DIAGNOSIS — E785 Hyperlipidemia, unspecified: Secondary | ICD-10-CM | POA: Diagnosis not present

## 2016-04-12 DIAGNOSIS — Z905 Acquired absence of kidney: Secondary | ICD-10-CM | POA: Diagnosis not present

## 2016-04-12 DIAGNOSIS — Z1211 Encounter for screening for malignant neoplasm of colon: Secondary | ICD-10-CM | POA: Diagnosis not present

## 2016-04-12 DIAGNOSIS — N400 Enlarged prostate without lower urinary tract symptoms: Secondary | ICD-10-CM | POA: Diagnosis not present

## 2016-04-12 DIAGNOSIS — K5730 Diverticulosis of large intestine without perforation or abscess without bleeding: Secondary | ICD-10-CM | POA: Diagnosis not present

## 2016-04-12 DIAGNOSIS — Z8249 Family history of ischemic heart disease and other diseases of the circulatory system: Secondary | ICD-10-CM | POA: Diagnosis not present

## 2016-04-12 DIAGNOSIS — K6289 Other specified diseases of anus and rectum: Secondary | ICD-10-CM | POA: Diagnosis not present

## 2016-04-12 DIAGNOSIS — D123 Benign neoplasm of transverse colon: Secondary | ICD-10-CM | POA: Diagnosis not present

## 2016-04-12 DIAGNOSIS — Z85528 Personal history of other malignant neoplasm of kidney: Secondary | ICD-10-CM | POA: Diagnosis not present

## 2016-04-12 DIAGNOSIS — D124 Benign neoplasm of descending colon: Secondary | ICD-10-CM | POA: Diagnosis not present

## 2016-04-12 DIAGNOSIS — Z79899 Other long term (current) drug therapy: Secondary | ICD-10-CM | POA: Diagnosis not present

## 2016-04-12 DIAGNOSIS — I10 Essential (primary) hypertension: Secondary | ICD-10-CM | POA: Diagnosis not present

## 2016-04-12 DIAGNOSIS — Z803 Family history of malignant neoplasm of breast: Secondary | ICD-10-CM | POA: Diagnosis not present

## 2016-04-12 DIAGNOSIS — Z86010 Personal history of colonic polyps: Secondary | ICD-10-CM | POA: Diagnosis not present

## 2016-04-12 DIAGNOSIS — K635 Polyp of colon: Secondary | ICD-10-CM | POA: Diagnosis not present

## 2016-04-13 DIAGNOSIS — I10 Essential (primary) hypertension: Secondary | ICD-10-CM | POA: Diagnosis not present

## 2016-04-13 DIAGNOSIS — E784 Other hyperlipidemia: Secondary | ICD-10-CM | POA: Diagnosis not present

## 2016-04-13 DIAGNOSIS — N184 Chronic kidney disease, stage 4 (severe): Secondary | ICD-10-CM | POA: Diagnosis not present

## 2016-04-13 DIAGNOSIS — M818 Other osteoporosis without current pathological fracture: Secondary | ICD-10-CM | POA: Diagnosis not present

## 2016-04-17 DIAGNOSIS — M818 Other osteoporosis without current pathological fracture: Secondary | ICD-10-CM | POA: Diagnosis not present

## 2016-04-17 DIAGNOSIS — I10 Essential (primary) hypertension: Secondary | ICD-10-CM | POA: Diagnosis not present

## 2016-04-17 DIAGNOSIS — N183 Chronic kidney disease, stage 3 (moderate): Secondary | ICD-10-CM | POA: Diagnosis not present

## 2016-04-20 DIAGNOSIS — N184 Chronic kidney disease, stage 4 (severe): Secondary | ICD-10-CM | POA: Diagnosis not present

## 2016-04-26 ENCOUNTER — Other Ambulatory Visit (HOSPITAL_COMMUNITY)
Admission: RE | Admit: 2016-04-26 | Discharge: 2016-04-26 | Disposition: A | Discharge status: Home / Self Care | Payer: Medicare Other | Source: Ambulatory Visit | Attending: Hematology & Oncology | Admitting: Hematology & Oncology

## 2016-04-26 ENCOUNTER — Encounter (HOSPITAL_COMMUNITY): Payer: Medicare Other | Attending: Hematology & Oncology | Admitting: Hematology & Oncology

## 2016-04-26 ENCOUNTER — Encounter (HOSPITAL_COMMUNITY): Payer: Self-pay | Admitting: Hematology & Oncology

## 2016-04-26 ENCOUNTER — Encounter (HOSPITAL_COMMUNITY): Payer: Medicare Other

## 2016-04-26 ENCOUNTER — Encounter (HOSPITAL_BASED_OUTPATIENT_CLINIC_OR_DEPARTMENT_OTHER): Payer: Medicare Other

## 2016-04-26 VITALS — BP 119/57 | HR 53 | Temp 97.8°F | Resp 16 | Ht 74.5 in | Wt 205.8 lb

## 2016-04-26 DIAGNOSIS — E538 Deficiency of other specified B group vitamins: Secondary | ICD-10-CM | POA: Diagnosis not present

## 2016-04-26 DIAGNOSIS — Z8589 Personal history of malignant neoplasm of other organs and systems: Secondary | ICD-10-CM

## 2016-04-26 DIAGNOSIS — Z8546 Personal history of malignant neoplasm of prostate: Secondary | ICD-10-CM | POA: Diagnosis not present

## 2016-04-26 DIAGNOSIS — N189 Chronic kidney disease, unspecified: Secondary | ICD-10-CM

## 2016-04-26 DIAGNOSIS — Z23 Encounter for immunization: Secondary | ICD-10-CM | POA: Diagnosis not present

## 2016-04-26 DIAGNOSIS — C7B02 Secondary carcinoid tumors of liver: Secondary | ICD-10-CM

## 2016-04-26 DIAGNOSIS — D631 Anemia in chronic kidney disease: Secondary | ICD-10-CM

## 2016-04-26 DIAGNOSIS — Z803 Family history of malignant neoplasm of breast: Secondary | ICD-10-CM

## 2016-04-26 DIAGNOSIS — Z85528 Personal history of other malignant neoplasm of kidney: Secondary | ICD-10-CM

## 2016-04-26 DIAGNOSIS — E039 Hypothyroidism, unspecified: Secondary | ICD-10-CM

## 2016-04-26 DIAGNOSIS — C787 Secondary malignant neoplasm of liver and intrahepatic bile duct: Secondary | ICD-10-CM

## 2016-04-26 DIAGNOSIS — C7A00 Malignant carcinoid tumor of unspecified site: Secondary | ICD-10-CM

## 2016-04-26 DIAGNOSIS — C61 Malignant neoplasm of prostate: Secondary | ICD-10-CM

## 2016-04-26 DIAGNOSIS — Z0000 Encounter for general adult medical examination without abnormal findings: Secondary | ICD-10-CM

## 2016-04-26 LAB — COMPREHENSIVE METABOLIC PANEL
ALBUMIN: 3.8 g/dL (ref 3.5–5.0)
ALK PHOS: 47 U/L (ref 38–126)
ALT: 26 U/L (ref 17–63)
ANION GAP: 5 (ref 5–15)
AST: 48 U/L — ABNORMAL HIGH (ref 15–41)
BILIRUBIN TOTAL: 0.7 mg/dL (ref 0.3–1.2)
BUN: 52 mg/dL — ABNORMAL HIGH (ref 6–20)
CALCIUM: 8.5 mg/dL — AB (ref 8.9–10.3)
CO2: 26 mmol/L (ref 22–32)
Chloride: 107 mmol/L (ref 101–111)
Creatinine, Ser: 4.15 mg/dL — ABNORMAL HIGH (ref 0.61–1.24)
GFR calc non Af Amer: 13 mL/min — ABNORMAL LOW (ref >60)
GFR, EST AFRICAN AMERICAN: 15 mL/min — AB (ref >60)
Glucose, Bld: 82 mg/dL (ref 65–99)
POTASSIUM: 4.3 mmol/L (ref 3.5–5.1)
SODIUM: 138 mmol/L (ref 135–145)
TOTAL PROTEIN: 6.6 g/dL (ref 6.5–8.1)

## 2016-04-26 LAB — CBC WITH DIFFERENTIAL/PLATELET
BASOS PCT: 0 %
Basophils Absolute: 0 10*3/uL (ref 0.0–0.1)
EOS ABS: 0.1 10*3/uL (ref 0.0–0.7)
Eosinophils Relative: 3 %
HCT: 31.6 % — ABNORMAL LOW (ref 39.0–52.0)
HEMOGLOBIN: 10.2 g/dL — AB (ref 13.0–17.0)
LYMPHS ABS: 0.5 10*3/uL — AB (ref 0.7–4.0)
Lymphocytes Relative: 12 %
MCH: 30.2 pg (ref 26.0–34.0)
MCHC: 32.3 g/dL (ref 30.0–36.0)
MCV: 93.5 fL (ref 78.0–100.0)
MONO ABS: 0.5 10*3/uL (ref 0.1–1.0)
MONOS PCT: 12 %
NEUTROS PCT: 73 %
Neutro Abs: 3.4 10*3/uL (ref 1.7–7.7)
Platelets: 123 10*3/uL — ABNORMAL LOW (ref 150–400)
RBC: 3.38 MIL/uL — ABNORMAL LOW (ref 4.22–5.81)
RDW: 13.8 % (ref 11.5–15.5)
WBC: 4.6 10*3/uL (ref 4.0–10.5)

## 2016-04-26 MED ORDER — OCTREOTIDE ACETATE 30 MG IM KIT
30.0000 mg | PACK | Freq: Once | INTRAMUSCULAR | Status: AC
  Administered 2016-04-26: 30 mg via INTRAMUSCULAR

## 2016-04-26 MED ORDER — INFLUENZA VAC SPLIT QUAD 0.5 ML IM SUSY
0.5000 mL | PREFILLED_SYRINGE | Freq: Once | INTRAMUSCULAR | Status: AC
  Administered 2016-04-26: 0.5 mL via INTRAMUSCULAR

## 2016-04-26 NOTE — Progress Notes (Signed)
Allen Davenport presents today for injection per the provider's orders.  Sandostatin administration without incident; see MAR for injection details.  Patient tolerated procedure well and without incident.  No questions or complaints noted at this time.

## 2016-04-26 NOTE — Progress Notes (Signed)
[place] NOTE  Patient Care Team: [name], MD as PCP - General (Internal Medicine)  CHIEF COMPLAINTS/PURPOSE OF CONSULTATION:  Carcinoid, on Sandostatin for 6 or 7 years Hypothyroidism (acquired) Anemia in chronic renal disease History of head and neck cancer, s/p surgery 2009 and radiation therapy History of kidney cancer diagnosed in 2009, s/p right nephrectomy May 2010 at [facility] History of prostate cancer, s/p prostate seed implant with Dr. Tammi Klippel in 2016 History of iron and B12 deficiency, taking oral iron  HISTORY OF PRESENTING ILLNESS:  Allen Davenport 75 y.o. male is here to establish care for carcinoid. He also has a history of head and neck cancer, kidney cancer, and prostate cancer. The patient's last visit with Burns Spain, NP was 09/30/2015 at [facility].  Allen Davenport is a pleasant 75 y.o. gentleman with carcinoid and receiving ongoing Sandostatin therapy. He has a history of head and neck cancer, status post surgery in 2009 and radiation therapy. He has a history of kidney cancer treated with right nephrectomy in May 2010 at [facility]. He was also found to have prostate cancer in the past with Dr. Exie Parody, he had a prostate seed implant with Dr. Tammi Klippel in 2016. He has a chronic renal medical disease which was stable at his 09/30/2015 oncology visit. He also has a history of iron and B12 deficiency. At his 09/30/2015 oncology visit he planned to restart his oral iron. He follows with Dr. Exie Parody, Dr. Lowanda Foster, and Dr. Sherrie Sport.  Allen Davenport presents to the [place] unaccompanied.  He believes his kidney cancer was originally found through blood work.   Describes his energy as 60 to 75%. This has been a gradual change. He stays active by doing his own yard work, including mowing and trimming bushes.   He only eats two meals a day.   He sees Dr. Lowanda Foster for his kidney care. He last did a "urine jug test" about one month ago at [facility].   On ProCrit or  Aranesp once monthly at [facility] with Dr. Lowanda Foster. His last colonoscopy was performed 2 weeks ago with Dr. Britta Mccreedy. Some polyps were found.   He denies any issues taking oral iron.   He denies any new pain anywhere.   He is scheduled to see his urologist Dr. Exie Parody next month. He no longer follows with radiation oncologist Dr. Tammi Klippel.   The patient is here to establish care of carcinoid and surveillance of multiple previous malignancies.    MEDICAL HISTORY:  Past Medical History:  Diagnosis Date  . Anemia   . Cancer [facility])    right renal . Prostate- Radiation treatment  . CHF (congestive heart failure) ([place])   . Chronic kidney disease    stage 4  . Edema   . Heart murmur    "nothing to worry about"  . Hyperlipidemia   . Hypertension   . Malignant carcinoid tumor ([place]) 01/03/2016    SURGICAL HISTORY: Past Surgical History:  Procedure Laterality Date  . AV FISTULA PLACEMENT Left 10/20/2014   Procedure: Left Arm ARTERIOVENOUS (AV) FISTULA CREATION;  Surgeon: Elam Dutch, MD;  Location: [facility];  Service: Vascular;  Laterality: Left;  . NEPHRECTOMY Right 2010    SOCIAL HISTORY: Social History   Social History  . Marital status: Married    Spouse name: N/A  . Number of children: N/A  . Years of education: N/A   Occupational History  . Not on file.   Social History Main Topics  .  Smoking status: Never Smoker  . Smokeless tobacco: Never Used  . Alcohol use No  . Drug use: No  . Sexual activity: Not on file   Other Topics Concern  . Not on file   Social History Narrative  . No narrative on file   Married 29 years 0 children 1 little dog Non smoker ETOH, drinks a little beer - no whiskey He stays active by doing his own yard work. 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  FAMILY HISTORY: Family History  Problem Relation Age of Onset  . Cancer Mother   . Hypertension Father   . Cancer Sister    Mother  deceased at 63 yo of breast cancer Father deceased at 35 yo of a stroke 2 sisters living. 1 sister recovering from breast cancer. Older sister is doing okay.  ALLERGIES:  has No Known Allergies.  MEDICATIONS:  Current Outpatient Prescriptions  Medication Sig Dispense Refill  . atorvastatin (LIPITOR) 80 MG tablet Take 80 mg by mouth daily.    . calcitRIOL (ROCALTROL) 0.25 MCG capsule     . cholecalciferol (VITAMIN D) 1000 UNITS tablet Take 1,000 Units by mouth daily.    . furosemide (LASIX) 40 MG tablet Take 40 mg by mouth.    . hydrALAZINE (APRESOLINE) 50 MG tablet Take 50 mg by mouth 2 (two) times daily.  0  . labetalol (NORMODYNE) 300 MG tablet Take 300 mg by mouth 2 (two) times daily.    . Omega-3 Fatty Acids (FISH OIL) 1000 MG CAPS Take 1 capsule by mouth daily.    . sodium bicarbonate 650 MG tablet Take 650 mg by mouth daily.    . tamsulosin (FLOMAX) 0.4 MG CAPS capsule      Current Facility-Administered Medications  Medication Dose Route Frequency Provider Last Rate Last Dose  . Influenza vac split quadrivalent PF (FLUARIX) injection 0.5 mL  0.5 mL Intramuscular Once Patrici Ranks, MD        Review of Systems  Constitutional: Positive for malaise/fatigue.       Gradual decrease in energy level. Describes energy level is 60 to 75%.  HENT: Negative.   Eyes: Negative.   Respiratory: Negative.   Cardiovascular: Negative.   Gastrointestinal: Negative.   Genitourinary: Negative.   Musculoskeletal: Negative.   Skin: Negative.   Neurological: Negative.   Endo/Heme/Allergies: Negative.   Psychiatric/Behavioral: Negative.   All other systems reviewed and are negative.  14 point ROS was done and is otherwise as detailed above or in HPI   PHYSICAL EXAMINATION: ECOG PERFORMANCE STATUS: 1 - Symptomatic but completely ambulatory  Vitals:   04/26/16 1447  BP: (!) 119/57  Pulse: (!) 53  Resp: 16  Temp: 97.8 F (36.6 C)   Filed Weights   04/26/16 1447  Weight: 205 lb  12.8 oz (93.4 kg)     Physical Exam  Constitutional: He is oriented to person, place, and time and well-developed, well-nourished, and in no distress.  HENT:  Head: Normocephalic and atraumatic.  Mouth/Throat: Oropharynx is clear and moist.  Eyes: Conjunctivae and EOM are normal. Pupils are equal, round, and reactive to light.  Neck: Normal range of motion. Neck supple.  Cardiovascular: Normal rate and regular rhythm.   Murmur heard. Pulmonary/Chest: Effort normal and breath sounds normal.  Abdominal: Soft. Bowel sounds are normal.  Musculoskeletal: Normal range of motion.  Neurological: He is alert and oriented to person, place, and time. Gait normal.  Skin: Skin is  warm and dry.  Nursing note and vitals reviewed.   LABORATORY DATA:  I have reviewed the data as listed Lab Results  Component Value Date   WBC 4.2 02/24/2016   HGB 10.5 (L) 02/24/2016   HCT 31.4 (L) 02/24/2016   MCV 91.8 02/24/2016   PLT 107 (L) 02/24/2016   CMP     Component Value Date/Time   NA 134 (L) 02/24/2016 1503   K 4.3 02/24/2016 1503   CL 104 02/24/2016 1503   CO2 24 02/24/2016 1503   GLUCOSE 132 (H) 02/24/2016 1503   BUN 59 (H) 02/24/2016 1503   CREATININE 4.25 (H) 02/24/2016 1503   CALCIUM 8.4 (L) 02/24/2016 1503   PROT 6.0 (L) 02/24/2016 1503   ALBUMIN 3.6 02/24/2016 1503   AST 38 02/24/2016 1503   ALT 21 02/24/2016 1503   ALKPHOS 48 02/24/2016 1503   BILITOT 0.6 02/24/2016 1503   GFRNONAA 12 (L) 02/24/2016 1503   GFRAA 14 (L) 02/24/2016 1503         RADIOGRAPHIC STUDIES: I have personally reviewed the radiological images as listed and agreed with the findings in the report.  Outside scans from [facility]: MRI/MR Abdomen w/o CM 12/01/2014 "1. Limitations of today's exam include prominent motion artifact and lack of IV contrast. 2. Left renal cystic lesions are essentially similar to last year's CT scan in terms of size. Several of these lesions are complex. Enhancement  characteristics are not available today due to lack of contrast. 3. Small T1 hypodense lesion in the dome of the liver is stable back through 2013 and likely benign."  Echocardiogram 09/01/2013  ASSESSMENT & PLAN:   Stage IV Carcinoid, on Sandostatin for 6 or 7 years  Liver metastases Hypothyroidism (acquired) Anemia in chronic renal disease History of head and neck cancer, s/p surgery and radiation (chemoradiation mentioned in one note) History of T1bN0 kidney cancer diagnosed in 2009, s/p right nephrectomy May 2010 at [facility] History of prostate cancer, s/p prostate seed implant with Dr. Tammi Klippel in 2016 History of iron and B12 deficiency, taking oral iron  The patient is here to establish care of carcinoid and surveillance of multiple previous malignancies. He has been on sandostatin for many years.  The exact details of his carcinoid are unknown, per records at [facility], a liver biopsy revealed stage IV carcinoid.  I do not have records regarding his surgery, staging. MRI (non contrasted) of the abdomen dated 12/01/2014 is documented above.   He is here for ongoing Sandostatin for carcinoid. His last injection of Sandostatin was on 03/24/2016. His last lab draw was on 02/24/2016.  He is on ProCrit or Aranesp once monthly at [facility] with Dr. Burnett Sheng whom he follows with for his CKD.   He follows with urologist Dr. Exie Parody. His next appointment with urology is next month. The patient notes that his prostate carcinoma is followed by Dr. Exie Parody.   He is up to date on colonoscopies.   He received a flu shot today.  He will return for follow up in 1 month. He was given a 24 hr for 5-HIAA to bring back at this next visit. At his next visit, we will discuss ongoing surveillance of multiple previous malignancies with regards to imaging.  I will obtain all records in regards to his carcinoid prior to his next visit.   ORDERS PLACED FOR THIS ENCOUNTER:  Orders Placed This Encounter  Procedures  .  Chromogranin A    Standing Status:   Future    Number  of Occurrences:   1    Standing Expiration Date:   04/26/2017  . CBC with Differential    Standing Status:   Future    Number of Occurrences:   1    Standing Expiration Date:   04/26/2017  . Comprehensive metabolic panel    Standing Status:   Future    Number of Occurrences:   1    Standing Expiration Date:   04/26/2017  . Serotonin serum    Standing Status:   Future    Number of Occurrences:   1    Standing Expiration Date:   04/26/2017  . 5 HIAA, quantitative, urine, 24 hour    Standing Status:   Future    Standing Expiration Date:   04/26/2017  . Chromogranin A    Standing Status:   Future    Standing Expiration Date:   04/26/2017  . CBC with Differential    Standing Status:   Future    Standing Expiration Date:   04/26/2017  . Comprehensive metabolic panel    Standing Status:   Future    Standing Expiration Date:   04/26/2017  . Serotonin serum    Standing Status:   Future    Standing Expiration Date:   04/26/2017    MEDICATIONS PRESCRIBED THIS ENCOUNTER: Meds ordered this encounter  Medications  . Influenza vac split quadrivalent PF (FLUARIX) injection 0.5 mL  . calcitRIOL (ROCALTROL) 0.25 MCG capsule  . tamsulosin (FLOMAX) 0.4 MG CAPS capsule     All questions were answered. The patient knows to call the clinic with any problems, questions or concerns.  This document serves as a record of services personally performed by Ancil Linsey, MD. It was created on her behalf by Arlyce Harman, a trained medical scribe. The creation of this record is based on the scribe's personal observations and the provider's statements to them. This document has been checked and approved by the attending provider.  I have reviewed the above documentation for accuracy and completeness and I agree with the above.  This note was electronically signed.  Molli Hazard, MD  04/26/2016 2:54 PM

## 2016-04-26 NOTE — Progress Notes (Signed)
Allen Davenport presents today for injection per MD orders. Flu Vaccine administered IM in right Upper Arm. Administration without incident. Patient tolerated well.

## 2016-04-26 NOTE — Patient Instructions (Addendum)
[place] at [facility] Discharge Instructions  RECOMMENDATIONS MADE BY THE CONSULTANT AND ANY TEST RESULTS WILL BE SENT TO YOUR REFERRING PHYSICIAN.  You saw Dr. Whitney Muse today. You had the flu shot today. Lab work today. Follow up in 3 months with lab work. Continue sandostatin injection. Collect 24 hour urine (can bring back at next injection)  Thank you for choosing [place] at [facility] to provide your oncology and hematology care.  To afford each patient quality time with our provider, please arrive at least 15 minutes before your scheduled appointment time.   Beginning January 23rd 2017 lab work for the [name] will be done in the  Main lab at [facility] on 1st floor. If you have a lab appointment with the [facility] please come in thru the  Main Entrance and check in at the main information desk  You need to re-schedule your appointment should you arrive 10 or more minutes late.  We strive to give you quality time with our providers, and arriving late affects you and other patients whose appointments are after yours.  Also, if you no show three or more times for appointments you may be dismissed from the clinic at the providers discretion.     Again, thank you for choosing [facility].  Our hope is that these requests will decrease the amount of time that you wait before being seen by our physicians.       _____________________________________________________________  Should you have questions after your visit to [facility], please contact our office at [phone] between the hours of 8:30 a.m. and 4:30 p.m.  Voicemails left after 4:30 p.m. will not be returned until the following business day.  For prescription refill requests, have your pharmacy contact our office.         Resources For Cancer Patients and their Caregivers ? American Cancer Society: Can assist with transportation,  wigs, general needs, runs Look Good Feel Better.        740-334-0577 ? Cancer Care: Provides financial assistance, online support groups, medication/co-pay assistance.  1-800-813-HOPE 406 122 7273) ? [place] Assists [place] cancer patients and their families through emotional , educational and financial support.  463-326-4613 ? [place] DSS Where to apply for food stamps, Medicaid and utility assistance. (418)070-1199 ? RCATS: Transportation to medical appointments. 2125288530 ? Social Security Administration: May apply for disability if have a Stage IV cancer. [phone] 973-107-8841 ? [employer], Disability and Transit Services: Assists with nutrition, care and transit needs. [place] Support Programs: @10RELATIVEDAYS @ > Cancer Support Group  2nd Tuesday of the month 1pm-2pm, Journey Room  > Creative Journey  3rd Tuesday of the month 1130am-1pm, Journey Room  > Look Good Feel Better  1st Wednesday of the month 10am-12 noon, Journey Room (Call American Cancer Society to register [phone])   Influenza Virus Vaccine injection (Fluarix) What is this medicine? INFLUENZA VIRUS VACCINE (in floo EN zuh VAHY ruhs vak SEEN) helps to reduce the risk of getting influenza also known as the flu. This medicine may be used for other purposes; ask your health care provider or pharmacist if you have questions. What should I tell my health care provider before I take this medicine? They need to know if you have any of these conditions: -bleeding disorder like hemophilia -fever or infection -Guillain-Barre syndrome or other neurological problems -immune system problems -infection with the human immunodeficiency  virus (HIV) or AIDS -low blood platelet counts -multiple sclerosis -an unusual or allergic reaction to influenza virus vaccine, eggs, chicken proteins, latex, gentamicin, other medicines, foods, dyes or  preservatives -pregnant or trying to get pregnant -breast-feeding How should I use this medicine? This vaccine is for injection into a muscle. It is given by a health care professional. A copy of Vaccine Information Statements will be given before each vaccination. Read this sheet carefully each time. The sheet may change frequently. Talk to your pediatrician regarding the use of this medicine in children. Special care may be needed. Overdosage: If you think you have taken too much of this medicine contact a poison control center or emergency room at once. NOTE: This medicine is only for you. Do not share this medicine with others. What if I miss a dose? This does not apply. What may interact with this medicine? -chemotherapy or radiation therapy -medicines that lower your immune system like etanercept, anakinra, infliximab, and adalimumab -medicines that treat or prevent blood clots like warfarin -phenytoin -steroid medicines like prednisone or cortisone -theophylline -vaccines This list may not describe all possible interactions. Give your health care provider a list of all the medicines, herbs, non-prescription drugs, or dietary supplements you use. Also tell them if you smoke, drink alcohol, or use illegal drugs. Some items may interact with your medicine. What should I watch for while using this medicine? Report any side effects that do not go away within 3 days to your doctor or health care professional. Call your health care provider if any unusual symptoms occur within 6 weeks of receiving this vaccine. You may still catch the flu, but the illness is not usually as bad. You cannot get the flu from the vaccine. The vaccine will not protect against colds or other illnesses that may cause fever. The vaccine is needed every year. What side effects may I notice from receiving this medicine? Side effects that you should report to your doctor or health care professional as soon as  possible: -allergic reactions like skin rash, itching or hives, swelling of the face, lips, or tongue Side effects that usually do not require medical attention (report to your doctor or health care professional if they continue or are bothersome): -fever -headache -muscle aches and pains -pain, tenderness, redness, or swelling at site where injected -weak or tired This list may not describe all possible side effects. Call your doctor for medical advice about side effects. You may report side effects to FDA at 1-800-FDA-1088. Where should I keep my medicine? This vaccine is only given in a clinic, pharmacy, doctor's office, or other health care setting and will not be stored at home. NOTE: This sheet is a summary. It may not cover all possible information. If you have questions about this medicine, talk to your doctor, pharmacist, or health care provider.    2016, Elsevier/Gold Standard. (2008-01-29 09:30:40)

## 2016-04-28 LAB — CHROMOGRANIN A: Chromogranin A: 15 nmol/L — ABNORMAL HIGH (ref 0–5)

## 2016-04-28 LAB — SEROTONIN SERUM: Serotonin, Serum: 311 ng/mL (ref 21–321)

## 2016-04-30 ENCOUNTER — Encounter (HOSPITAL_COMMUNITY): Payer: Self-pay | Admitting: Hematology & Oncology

## 2016-05-03 DIAGNOSIS — H35363 Drusen (degenerative) of macula, bilateral: Secondary | ICD-10-CM | POA: Diagnosis not present

## 2016-05-17 DIAGNOSIS — I10 Essential (primary) hypertension: Secondary | ICD-10-CM | POA: Diagnosis not present

## 2016-05-17 DIAGNOSIS — M818 Other osteoporosis without current pathological fracture: Secondary | ICD-10-CM | POA: Diagnosis not present

## 2016-05-17 DIAGNOSIS — N183 Chronic kidney disease, stage 3 (moderate): Secondary | ICD-10-CM | POA: Diagnosis not present

## 2016-05-18 DIAGNOSIS — E559 Vitamin D deficiency, unspecified: Secondary | ICD-10-CM | POA: Diagnosis not present

## 2016-05-18 DIAGNOSIS — Z79899 Other long term (current) drug therapy: Secondary | ICD-10-CM | POA: Diagnosis not present

## 2016-05-18 DIAGNOSIS — N184 Chronic kidney disease, stage 4 (severe): Secondary | ICD-10-CM | POA: Diagnosis not present

## 2016-05-18 DIAGNOSIS — R809 Proteinuria, unspecified: Secondary | ICD-10-CM | POA: Diagnosis not present

## 2016-05-24 ENCOUNTER — Encounter (HOSPITAL_COMMUNITY): Payer: Medicare Other | Attending: Hematology & Oncology

## 2016-05-24 ENCOUNTER — Encounter (HOSPITAL_COMMUNITY): Payer: Self-pay

## 2016-05-24 VITALS — BP 118/58 | HR 64 | Temp 97.9°F | Resp 18

## 2016-05-24 DIAGNOSIS — C7A00 Malignant carcinoid tumor of unspecified site: Secondary | ICD-10-CM | POA: Insufficient documentation

## 2016-05-24 MED ORDER — OCTREOTIDE ACETATE 30 MG IM KIT
30.0000 mg | PACK | Freq: Once | INTRAMUSCULAR | Status: AC
  Administered 2016-05-24: 30 mg via INTRAMUSCULAR

## 2016-05-24 NOTE — Patient Instructions (Signed)
[place] at [facility] Discharge Instructions  RECOMMENDATIONS MADE BY THE CONSULTANT AND ANY TEST RESULTS WILL BE SENT TO YOUR REFERRING PHYSICIAN.  Faslodex given today Follow up as scheduled.  Thank you for choosing [place] at [facility] to provide your oncology and hematology care.  To afford each patient quality time with our provider, please arrive at least 15 minutes before your scheduled appointment time.   Beginning January 23rd 2017 lab work for the [name] will be done in the  Main lab at [facility] on 1st floor. If you have a lab appointment with the [facility] please come in thru the  Main Entrance and check in at the main information desk  You need to re-schedule your appointment should you arrive 10 or more minutes late.  We strive to give you quality time with our providers, and arriving late affects you and other patients whose appointments are after yours.  Also, if you no show three or more times for appointments you may be dismissed from the clinic at the providers discretion.     Again, thank you for choosing [facility].  Our hope is that these requests will decrease the amount of time that you wait before being seen by our physicians.       _____________________________________________________________  Should you have questions after your visit to [facility], please contact our office at [phone] between the hours of 8:30 a.m. and 4:30 p.m.  Voicemails left after 4:30 p.m. will not be returned until the following business day.  For prescription refill requests, have your pharmacy contact our office.         Resources For Cancer Patients and their Caregivers ? American Cancer Society: Can assist with transportation, wigs, general needs, runs Look Good Feel Better.        832-018-0662 ? Cancer Care: Provides financial assistance, online support groups, medication/co-pay  assistance.  1-800-813-HOPE 469-763-2634) ? [place] Assists [place] cancer patients and their families through emotional , educational and financial support.  ([phone] ? [place] DSS Where to apply for food stamps, Medicaid and utility assistance. (438)622-7563 ? RCATS: Transportation to medical appointments. [phone] [phone] ? Social Security Administration: May apply for disability if have a Stage IV cancer. [phone] ? [employer], Disability and Transit Services: Assists with nutrition, care and transit needs. [place] Support Programs: @10RELATIVEDAYS @ > Cancer Support Group  2nd Tuesday of the month 1pm-2pm, Journey Room  > Creative Journey  3rd Tuesday of the month 1130am-1pm, Journey Room  > Look Good Feel Better  1st Wednesday of the month 10am-12 noon, Journey Room (Call [place] to register [phone])

## 2016-05-24 NOTE — Progress Notes (Signed)
Allen Davenport presents today for injection per MD orders. faslodex 30mg  administered IM in the right upper buttocks. Administration without incident. Patient tolerated well.  Vitals stable and discharged from clinic ambulatory.

## 2016-05-29 LAB — 5 HIAA, QUANTITATIVE, URINE, 24 HOUR
5-HIAA, Ur: 6 mg/L
5-HIAA,Quant.,24 Hr Urine: 10.2 mg/24 hr (ref 0.0–14.9)
Total Volume: 1700

## 2016-05-30 DIAGNOSIS — C320 Malignant neoplasm of glottis: Secondary | ICD-10-CM | POA: Diagnosis not present

## 2016-05-30 DIAGNOSIS — R490 Dysphonia: Secondary | ICD-10-CM | POA: Diagnosis not present

## 2016-05-31 DIAGNOSIS — E1140 Type 2 diabetes mellitus with diabetic neuropathy, unspecified: Secondary | ICD-10-CM | POA: Diagnosis not present

## 2016-05-31 DIAGNOSIS — E1151 Type 2 diabetes mellitus with diabetic peripheral angiopathy without gangrene: Secondary | ICD-10-CM | POA: Diagnosis not present

## 2016-06-12 DIAGNOSIS — Q600 Renal agenesis, unilateral: Secondary | ICD-10-CM | POA: Diagnosis not present

## 2016-06-12 DIAGNOSIS — C61 Malignant neoplasm of prostate: Secondary | ICD-10-CM | POA: Diagnosis not present

## 2016-06-15 DIAGNOSIS — Z79899 Other long term (current) drug therapy: Secondary | ICD-10-CM | POA: Diagnosis not present

## 2016-06-15 DIAGNOSIS — D509 Iron deficiency anemia, unspecified: Secondary | ICD-10-CM | POA: Diagnosis not present

## 2016-06-15 DIAGNOSIS — N184 Chronic kidney disease, stage 4 (severe): Secondary | ICD-10-CM | POA: Diagnosis not present

## 2016-06-15 DIAGNOSIS — R809 Proteinuria, unspecified: Secondary | ICD-10-CM | POA: Diagnosis not present

## 2016-06-15 DIAGNOSIS — E559 Vitamin D deficiency, unspecified: Secondary | ICD-10-CM | POA: Diagnosis not present

## 2016-06-19 DIAGNOSIS — R809 Proteinuria, unspecified: Secondary | ICD-10-CM | POA: Diagnosis not present

## 2016-06-19 DIAGNOSIS — I10 Essential (primary) hypertension: Secondary | ICD-10-CM | POA: Diagnosis not present

## 2016-06-19 DIAGNOSIS — C61 Malignant neoplasm of prostate: Secondary | ICD-10-CM | POA: Diagnosis not present

## 2016-06-19 DIAGNOSIS — Q600 Renal agenesis, unilateral: Secondary | ICD-10-CM | POA: Diagnosis not present

## 2016-06-19 DIAGNOSIS — N250 Renal osteodystrophy: Secondary | ICD-10-CM | POA: Diagnosis not present

## 2016-06-19 DIAGNOSIS — E872 Acidosis: Secondary | ICD-10-CM | POA: Diagnosis not present

## 2016-06-19 DIAGNOSIS — N184 Chronic kidney disease, stage 4 (severe): Secondary | ICD-10-CM | POA: Diagnosis not present

## 2016-06-19 DIAGNOSIS — R39198 Other difficulties with micturition: Secondary | ICD-10-CM | POA: Diagnosis not present

## 2016-06-19 DIAGNOSIS — R7989 Other specified abnormal findings of blood chemistry: Secondary | ICD-10-CM | POA: Diagnosis not present

## 2016-06-19 DIAGNOSIS — I509 Heart failure, unspecified: Secondary | ICD-10-CM | POA: Diagnosis not present

## 2016-06-21 ENCOUNTER — Encounter (HOSPITAL_COMMUNITY): Payer: Medicare Other | Attending: Hematology & Oncology

## 2016-06-21 VITALS — BP 123/58 | HR 63 | Temp 98.0°F

## 2016-06-21 DIAGNOSIS — C7A00 Malignant carcinoid tumor of unspecified site: Secondary | ICD-10-CM | POA: Insufficient documentation

## 2016-06-21 MED ORDER — OCTREOTIDE ACETATE 30 MG IM KIT
30.0000 mg | PACK | Freq: Once | INTRAMUSCULAR | Status: AC
  Administered 2016-06-21: 30 mg via INTRAMUSCULAR

## 2016-06-21 NOTE — Progress Notes (Signed)
Allen Davenport tolerated Sandostatin injection well without complaints or incident. VSS Pt discharged self ambulatory in satisfactory condition 

## 2016-06-21 NOTE — Patient Instructions (Signed)
[place] at [facility] Discharge Instructions  RECOMMENDATIONS MADE BY THE CONSULTANT AND ANY TEST RESULTS WILL BE SENT TO YOUR REFERRING PHYSICIAN.  Received Sandostatin injection today. Follow-up as scheduled. Call clinic for any questions or concerns  Thank you for choosing [place] at [facility] to provide your oncology and hematology care.  To afford each patient quality time with our provider, please arrive at least 15 minutes before your scheduled appointment time.   Beginning January 23rd 2017 lab work for the [name] will be done in the  Main lab at [facility] on 1st floor. If you have a lab appointment with the [facility] please come in thru the  Main Entrance and check in at the main information desk  You need to re-schedule your appointment should you arrive 10 or more minutes late.  We strive to give you quality time with our providers, and arriving late affects you and other patients whose appointments are after yours.  Also, if you no show three or more times for appointments you may be dismissed from the clinic at the providers discretion.     Again, thank you for choosing [facility].  Our hope is that these requests will decrease the amount of time that you wait before being seen by our physicians.       _____________________________________________________________  Should you have questions after your visit to [facility], please contact our office at [phone] between the hours of 8:30 a.m. and 4:30 p.m.  Voicemails left after 4:30 p.m. will not be returned until the following business day.  For prescription refill requests, have your pharmacy contact our office.         Resources For Cancer Patients and their Caregivers ? American Cancer Society: Can assist with transportation, wigs, general needs, runs Look Good Feel Better.        ([phone] ? Cancer Care: Provides  financial assistance, online support groups, medication/co-pay assistance.  1-800-813-HOPE 787-436-2906) ? [place] Assists [place] cancer patients and their families through emotional , educational and financial support.  410-408-4578 ? [place] DSS Where to apply for food stamps, Medicaid and utility assistance. ([phone] ? RCATS: Transportation to medical appointments. 240-411-8111 ? Social Security Administration: May apply for disability if have a Stage IV cancer. 859-721-8285 5021556712 ? [employer], Disability and Transit Services: Assists with nutrition, care and transit needs. [place] Support Programs: @10RELATIVEDAYS @ > Cancer Support Group  2nd Tuesday of the month 1pm-2pm, Journey Room  > Creative Journey  3rd Tuesday of the month 1130am-1pm, Journey Room  > Look Good Feel Better  1st Wednesday of the month 10am-12 noon, Journey Room (Call [place] to register [phone])

## 2016-06-22 DIAGNOSIS — M818 Other osteoporosis without current pathological fracture: Secondary | ICD-10-CM | POA: Diagnosis not present

## 2016-06-22 DIAGNOSIS — N183 Chronic kidney disease, stage 3 (moderate): Secondary | ICD-10-CM | POA: Diagnosis not present

## 2016-06-22 DIAGNOSIS — I10 Essential (primary) hypertension: Secondary | ICD-10-CM | POA: Diagnosis not present

## 2016-07-13 DIAGNOSIS — N184 Chronic kidney disease, stage 4 (severe): Secondary | ICD-10-CM | POA: Diagnosis not present

## 2016-07-18 DIAGNOSIS — N184 Chronic kidney disease, stage 4 (severe): Secondary | ICD-10-CM | POA: Diagnosis not present

## 2016-07-18 DIAGNOSIS — Z131 Encounter for screening for diabetes mellitus: Secondary | ICD-10-CM | POA: Diagnosis not present

## 2016-07-18 DIAGNOSIS — I10 Essential (primary) hypertension: Secondary | ICD-10-CM | POA: Diagnosis not present

## 2016-07-18 DIAGNOSIS — M818 Other osteoporosis without current pathological fracture: Secondary | ICD-10-CM | POA: Diagnosis not present

## 2016-07-18 DIAGNOSIS — E784 Other hyperlipidemia: Secondary | ICD-10-CM | POA: Diagnosis not present

## 2016-07-19 ENCOUNTER — Encounter (HOSPITAL_COMMUNITY): Payer: Medicare Other | Attending: Hematology & Oncology | Admitting: Hematology & Oncology

## 2016-07-19 ENCOUNTER — Encounter (HOSPITAL_COMMUNITY): Payer: Medicare Other | Attending: Hematology & Oncology

## 2016-07-19 ENCOUNTER — Encounter (HOSPITAL_COMMUNITY): Payer: Self-pay | Admitting: Hematology & Oncology

## 2016-07-19 ENCOUNTER — Encounter (HOSPITAL_COMMUNITY): Payer: Medicare Other

## 2016-07-19 VITALS — BP 143/82 | HR 54 | Temp 97.9°F | Resp 16 | Wt 212.3 lb

## 2016-07-19 DIAGNOSIS — E039 Hypothyroidism, unspecified: Secondary | ICD-10-CM

## 2016-07-19 DIAGNOSIS — C7B02 Secondary carcinoid tumors of liver: Secondary | ICD-10-CM

## 2016-07-19 DIAGNOSIS — C7A00 Malignant carcinoid tumor of unspecified site: Secondary | ICD-10-CM

## 2016-07-19 DIAGNOSIS — D631 Anemia in chronic kidney disease: Secondary | ICD-10-CM

## 2016-07-19 DIAGNOSIS — E538 Deficiency of other specified B group vitamins: Secondary | ICD-10-CM

## 2016-07-19 DIAGNOSIS — I10 Essential (primary) hypertension: Secondary | ICD-10-CM | POA: Diagnosis not present

## 2016-07-19 DIAGNOSIS — N183 Chronic kidney disease, stage 3 (moderate): Secondary | ICD-10-CM

## 2016-07-19 DIAGNOSIS — N184 Chronic kidney disease, stage 4 (severe): Secondary | ICD-10-CM | POA: Diagnosis not present

## 2016-07-19 DIAGNOSIS — N189 Chronic kidney disease, unspecified: Secondary | ICD-10-CM

## 2016-07-19 DIAGNOSIS — Z85528 Personal history of other malignant neoplasm of kidney: Secondary | ICD-10-CM | POA: Diagnosis not present

## 2016-07-19 DIAGNOSIS — C61 Malignant neoplasm of prostate: Secondary | ICD-10-CM

## 2016-07-19 DIAGNOSIS — Z8589 Personal history of malignant neoplasm of other organs and systems: Secondary | ICD-10-CM

## 2016-07-19 DIAGNOSIS — Z8546 Personal history of malignant neoplasm of prostate: Secondary | ICD-10-CM | POA: Diagnosis not present

## 2016-07-19 DIAGNOSIS — E784 Other hyperlipidemia: Secondary | ICD-10-CM | POA: Diagnosis not present

## 2016-07-19 DIAGNOSIS — C641 Malignant neoplasm of right kidney, except renal pelvis: Secondary | ICD-10-CM

## 2016-07-19 DIAGNOSIS — M818 Other osteoporosis without current pathological fracture: Secondary | ICD-10-CM | POA: Diagnosis not present

## 2016-07-19 LAB — COMPREHENSIVE METABOLIC PANEL
ALK PHOS: 53 U/L (ref 38–126)
ALT: 29 U/L (ref 17–63)
AST: 58 U/L — ABNORMAL HIGH (ref 15–41)
Albumin: 3.6 g/dL (ref 3.5–5.0)
Anion gap: 7 (ref 5–15)
BILIRUBIN TOTAL: 0.7 mg/dL (ref 0.3–1.2)
BUN: 54 mg/dL — ABNORMAL HIGH (ref 6–20)
CALCIUM: 9 mg/dL (ref 8.9–10.3)
CO2: 26 mmol/L (ref 22–32)
CREATININE: 3.95 mg/dL — AB (ref 0.61–1.24)
Chloride: 103 mmol/L (ref 101–111)
GFR calc non Af Amer: 14 mL/min — ABNORMAL LOW (ref >60)
GFR, EST AFRICAN AMERICAN: 16 mL/min — AB (ref >60)
GLUCOSE: 98 mg/dL (ref 65–99)
Potassium: 4.4 mmol/L (ref 3.5–5.1)
SODIUM: 136 mmol/L (ref 135–145)
TOTAL PROTEIN: 6.3 g/dL — AB (ref 6.5–8.1)

## 2016-07-19 LAB — CBC WITH DIFFERENTIAL/PLATELET
BASOS ABS: 0 10*3/uL (ref 0.0–0.1)
BASOS PCT: 1 %
Eosinophils Absolute: 0.2 10*3/uL (ref 0.0–0.7)
Eosinophils Relative: 5 %
HEMATOCRIT: 32.6 % — AB (ref 39.0–52.0)
HEMOGLOBIN: 10.8 g/dL — AB (ref 13.0–17.0)
LYMPHS PCT: 11 %
Lymphs Abs: 0.5 10*3/uL — ABNORMAL LOW (ref 0.7–4.0)
MCH: 31.2 pg (ref 26.0–34.0)
MCHC: 33.1 g/dL (ref 30.0–36.0)
MCV: 94.2 fL (ref 78.0–100.0)
MONO ABS: 0.6 10*3/uL (ref 0.1–1.0)
MONOS PCT: 14 %
NEUTROS ABS: 3 10*3/uL (ref 1.7–7.7)
NEUTROS PCT: 69 %
Platelets: 127 10*3/uL — ABNORMAL LOW (ref 150–400)
RBC: 3.46 MIL/uL — ABNORMAL LOW (ref 4.22–5.81)
RDW: 13.8 % (ref 11.5–15.5)
WBC: 4.3 10*3/uL (ref 4.0–10.5)

## 2016-07-19 MED ORDER — OCTREOTIDE ACETATE 30 MG IM KIT
30.0000 mg | PACK | Freq: Once | INTRAMUSCULAR | Status: AC
  Administered 2016-07-19: 30 mg via INTRAMUSCULAR

## 2016-07-19 MED ORDER — OCTREOTIDE ACETATE 30 MG IM KIT
PACK | INTRAMUSCULAR | Status: AC
  Filled 2016-07-19: qty 1

## 2016-07-19 NOTE — Patient Instructions (Signed)
[name] at [facility] Discharge Instructions  RECOMMENDATIONS MADE BY THE CONSULTANT AND ANY TEST RESULTS WILL BE SENT TO YOUR REFERRING PHYSICIAN.  Sandostatin  Thank you for choosing [place] at [facility] to provide your oncology and hematology care.  To afford each patient quality time with our provider, please arrive at least 15 minutes before your scheduled appointment time.    If you have a lab appointment with the [facility] please come in thru the  Main Entrance and check in at the main information desk  You need to re-schedule your appointment should you arrive 10 or more minutes late.  We strive to give you quality time with our providers, and arriving late affects you and other patients whose appointments are after yours.  Also, if you no show three or more times for appointments you may be dismissed from the clinic at the providers discretion.     Again, thank you for choosing [facility].  Our hope is that these requests will decrease the amount of time that you wait before being seen by our physicians.       _____________________________________________________________  Should you have questions after your visit to [facility], please contact our office at [phone] between the hours of 8:30 a.m. and 4:30 p.m.  Voicemails left after 4:30 p.m. will not be returned until the following business day.  For prescription refill requests, have your pharmacy contact our office.       Resources For Cancer Patients and their Caregivers ? American Cancer Society: Can assist with transportation, wigs, general needs, runs Look Good Feel Better.        581-875-4616 ? Cancer Care: Provides financial assistance, online support groups, medication/co-pay assistance.  1-800-813-HOPE 410-807-7231) ? [place] Assists [place] cancer patients and their families through emotional ,  educational and financial support.  337-710-7235 ? [place] DSS Where to apply for food stamps, Medicaid and utility assistance. (317)054-4585 ? RCATS: Transportation to medical appointments. 8142647924 ? Social Security Administration: May apply for disability if have a Stage IV cancer. 305-347-4019 432-228-0625 ? [employer], Disability and Transit Services: Assists with nutrition, care and transit needs. [place] Support Programs: @10RELATIVEDAYS @ > Cancer Support Group  2nd Tuesday of the month 1pm-2pm, Journey Room  > Creative Journey  3rd Tuesday of the month 1130am-1pm, Journey Room  > Look Good Feel Better  1st Wednesday of the month 10am-12 noon, Journey Room (Call [place] to register [phone])

## 2016-07-19 NOTE — Patient Instructions (Addendum)
[place] at [facility] Discharge Instructions  RECOMMENDATIONS MADE BY THE CONSULTANT AND ANY TEST RESULTS WILL BE SENT TO YOUR REFERRING PHYSICIAN.  You were seen today by Dr. Whitney Muse. Continue monthly Sandostatin. Return in 3 months for labs and follow up.   Thank you for choosing [place] at [facility] to provide your oncology and hematology care.  To afford each patient quality time with our provider, please arrive at least 15 minutes before your scheduled appointment time.    If you have a lab appointment with the [facility] please come in thru the  Main Entrance and check in at the main information desk  You need to re-schedule your appointment should you arrive 10 or more minutes late.  We strive to give you quality time with our providers, and arriving late affects you and other patients whose appointments are after yours.  Also, if you no show three or more times for appointments you may be dismissed from the clinic at the providers discretion.     Again, thank you for choosing [facility].  Our hope is that these requests will decrease the amount of time that you wait before being seen by our physicians.       _____________________________________________________________  Should you have questions after your visit to [facility], please contact our office at [phone] between the hours of 8:30 a.m. and 4:30 p.m.  Voicemails left after 4:30 p.m. will not be returned until the following business day.  For prescription refill requests, have your pharmacy contact our office.       Resources For Cancer Patients and their Caregivers ? American Cancer Society: Can assist with transportation, wigs, general needs, runs Look Good Feel Better.        346-449-8743 ? Cancer Care: Provides financial assistance, online support groups, medication/co-pay assistance.  1-800-813-HOPE (915)092-3731) ? [place] Assists [place] cancer patients and their families through emotional , educational and financial support.  5204870332 ? [place] DSS Where to apply for food stamps, Medicaid and utility assistance. (430)105-6721 ? RCATS: Transportation to medical appointments. 585-453-2950 ? Social Security Administration: May apply for disability if have a Stage IV cancer. [phone])455-6701 [phone] ? [employer], Disability and Transit Services: Assists with nutrition, care and transit needs. [place] Support Programs: @10RELATIVEDAYS @ > Cancer Support Group  2nd Tuesday of the month 1pm-2pm, Journey Room  > Creative Journey  3rd Tuesday of the month 1130am-1pm, Journey Room  > Look Good Feel Better  1st Wednesday of the month 10am-12 noon, Journey Room (Call [place] to register [phone])

## 2016-07-19 NOTE — Progress Notes (Signed)
[facility]  PROGRESS NOTE  Patient Care Team: [name], MD as PCP - General (Internal Medicine)  CHIEF COMPLAINTS/PURPOSE OF CONSULTATION:  Carcinoid, on Sandostatin for 6 or 7 years Hypothyroidism (acquired) Anemia in chronic renal disease History of head and neck cancer, s/p surgery 2009 and radiation therapy History of kidney cancer diagnosed in 2009, s/p right nephrectomy May 2010 at [facility] History of prostate cancer, s/p prostate seed implant with Dr. Tammi Klippel in 2016 History of iron and B12 deficiency, taking oral iron  HISTORY OF PRESENTING ILLNESS:  Allen Davenport 76 y.o. male is here for ongoing care of multiple malignancies. He has a history of carcinoid. He also has a history of head and neck cancer, kidney cancer, and prostate cancer. The patient's last visit with Burns Spain, NP was 09/30/2015 at [facility].  He is receiving ongoing Sandostatin therapy. He has a history of head and neck cancer, status post surgery in 2009 and radiation therapy. He has a history of kidney cancer treated with right nephrectomy in May 2010 at [facility]. He was also found to have prostate cancer in the past with Dr. Exie Parody, he had a prostate seed implant with Dr. Tammi Klippel in 2016. He has a chronic renal medical disease. He also has a history of iron and B12 deficiency. He follows with Dr. Exie Parody, Dr. Lowanda Foster, and Dr. Sherrie Sport.  Allen Davenport presents to the [facility] unaccompanied. He says he's doing okay. I have reviewed the labs with the patient.   He gained some weight. He ate well over the holidays.   His shots are going well. Denies any new problems or complaints.   He sees his nephrologist on 08/14/15.   He has been taking his Lasix once a day.  He denies SOB, wheezing. Diarrhea. No new pain, no change in energy.   MEDICAL HISTORY:  Past Medical History:  Diagnosis Date  . Anemia   . Cancer [facility])    right renal . Prostate- Radiation treatment  . CHF (congestive heart  failure) ([place])   . Chronic kidney disease    stage 4  . Edema   . Heart murmur    "nothing to worry about"  . Hyperlipidemia   . Hypertension   . Malignant carcinoid tumor ([place]) 01/03/2016    SURGICAL HISTORY: Past Surgical History:  Procedure Laterality Date  . AV FISTULA PLACEMENT Left 10/20/2014   Procedure: Left Arm ARTERIOVENOUS (AV) FISTULA CREATION;  Surgeon: Elam Dutch, MD;  Location: [place];  Service: Vascular;  Laterality: Left;  . NEPHRECTOMY Right 2010    SOCIAL HISTORY: Social History   Social History  . Marital status: Married    Spouse name: N/A  . Number of children: N/A  . Years of education: N/A   Occupational History  . Not on file.   Social History Main Topics  . Smoking status: Never Smoker  . Smokeless tobacco: Never Used  . Alcohol use No  . Drug use: No  . Sexual activity: Not on file   Other Topics Concern  . Not on file   Social History Narrative  . No narrative on file   Married 29 years 0 children 1 little dog Non smoker ETOH, drinks a little beer - no whiskey He stays active by doing his own yard work. 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  FAMILY HISTORY: Family History  Problem Relation Age of Onset  . Cancer Mother   .  Hypertension Father   . Cancer Sister    Mother deceased at 63 yo of breast cancer Father deceased at 72 yo of a stroke 2 sisters living. 1 sister recovering from breast cancer. Older sister is doing okay.  ALLERGIES:  has No Known Allergies.  MEDICATIONS:  Current Outpatient Prescriptions  Medication Sig Dispense Refill  . alendronate (FOSAMAX) 70 MG tablet TAKE ONE TABLET BY MOUTH ONCE A WEEK IN THE MORNING WITH A FULL GLASS OF WATER ON AN EMPTY STOMACH. DO NOT LAY DOWN FOR 30 MINUTES.  0  . atorvastatin (LIPITOR) 80 MG tablet Take 80 mg by mouth daily.    . calcitRIOL (ROCALTROL) 0.25 MCG capsule     . cholecalciferol (VITAMIN D) 1000 UNITS tablet  Take 1,000 Units by mouth daily.    . furosemide (LASIX) 40 MG tablet Take 40 mg by mouth.    . hydrALAZINE (APRESOLINE) 25 MG tablet TAKE THREE (3) TABLETS BY MOUTH TWICE DAILY  99  . hydrALAZINE (APRESOLINE) 50 MG tablet Take 50 mg by mouth 2 (two) times daily.  0  . labetalol (NORMODYNE) 300 MG tablet Take 300 mg by mouth 2 (two) times daily.    . Omega-3 Fatty Acids (FISH OIL) 1000 MG CAPS Take 1 capsule by mouth daily.    . sodium bicarbonate 650 MG tablet Take 650 mg by mouth daily.    . tamsulosin (FLOMAX) 0.4 MG CAPS capsule      No current facility-administered medications for this visit.    Facility-Administered Medications Ordered in Other Visits  Medication Dose Route Frequency Provider Last Rate Last Dose  . octreotide (SANDOSTATIN LAR) IM injection 30 mg  30 mg Intramuscular Once Baird Cancer, PA-C        Review of Systems  Constitutional: Negative.   HENT: Negative.   Eyes: Negative.   Respiratory: Negative.  Negative for shortness of breath and wheezing.   Cardiovascular: Negative.  Negative for leg swelling.  Gastrointestinal: Negative.  Negative for diarrhea.  Genitourinary: Negative.   Musculoskeletal: Negative.   Skin: Negative.   Neurological: Negative.   Endo/Heme/Allergies: Negative.   Psychiatric/Behavioral: Negative.   All other systems reviewed and are negative. 14 point ROS was done and is otherwise as detailed above or in HPI   PHYSICAL EXAMINATION: ECOG PERFORMANCE STATUS: 1 - Symptomatic but completely ambulatory  Vitals:   07/19/16 1313  BP: (!) 143/82  Pulse: (!) 54  Resp: 16  Temp: 97.9 F (36.6 C)   Filed Weights   07/19/16 1313  Weight: 212 lb 4.8 oz (96.3 kg)    Physical Exam  Constitutional: He is oriented to person, place, and time and well-developed, well-nourished, and in no distress.  Pt was able to get on exam table without assistance.   HENT:  Head: Normocephalic and atraumatic.  Mouth/Throat: Oropharynx is clear and  moist.  Eyes: Conjunctivae and EOM are normal. Pupils are equal, round, and reactive to light. No scleral icterus.  Neck: Normal range of motion. Neck supple.  Cardiovascular: Normal rate and regular rhythm.   Murmur heard. Pulmonary/Chest: Effort normal and breath sounds normal.  Abdominal: Soft. Bowel sounds are normal. He exhibits no distension. There is no tenderness. There is no rebound.  Musculoskeletal: Normal range of motion.  Lymphadenopathy:    He has no cervical adenopathy.  Neurological: He is alert and oriented to person, place, and time. Gait normal.  Skin: Skin is warm and dry.  Nursing note and vitals reviewed.   LABORATORY DATA:  I have reviewed the data as listed Lab Results  Component Value Date   WBC 4.3 07/19/2016   HGB 10.8 (L) 07/19/2016   HCT 32.6 (L) 07/19/2016   MCV 94.2 07/19/2016   PLT 127 (L) 07/19/2016   CMP     Component Value Date/Time   NA 136 07/19/2016 1214   K 4.4 07/19/2016 1214   CL 103 07/19/2016 1214   CO2 26 07/19/2016 1214   GLUCOSE 98 07/19/2016 1214   BUN 54 (H) 07/19/2016 1214   CREATININE 3.95 (H) 07/19/2016 1214   CALCIUM 9.0 07/19/2016 1214   PROT 6.3 (L) 07/19/2016 1214   ALBUMIN 3.6 07/19/2016 1214   AST 58 (H) 07/19/2016 1214   ALT 29 07/19/2016 1214   ALKPHOS 53 07/19/2016 1214   BILITOT 0.7 07/19/2016 1214   GFRNONAA 14 (L) 07/19/2016 1214   GFRAA 16 (L) 07/19/2016 1214         RADIOGRAPHIC STUDIES: I have personally reviewed the radiological images as listed and agreed with the findings in the report.  Outside scans from [facility]: MRI/MR Abdomen w/o CM 12/01/2014 "1. Limitations of today's exam include prominent motion artifact and lack of IV contrast. 2. Left renal cystic lesions are essentially similar to last year's CT scan in terms of size. Several of these lesions are complex. Enhancement characteristics are not available today due to lack of contrast. 3. Small T1 hypodense lesion in the dome of the  liver is stable back through 2013 and likely benign."  Echocardiogram 09/01/2013  ASSESSMENT & PLAN:   Stage IV Carcinoid, on Sandostatin for 6 or 7 years  Liver metastases Hypothyroidism (acquired) Anemia in chronic renal disease History of head and neck cancer, s/p surgery and radiation (chemoradiation mentioned in one note) History of T1bN0 kidney cancer diagnosed in 2009, s/p right nephrectomy May 2010 at [facility] History of prostate cancer, s/p prostate seed implant with Dr. Tammi Klippel in 2016 History of iron and B12 deficiency, taking oral iron  Will continue with ongoing surveillance of multiple previous malignancies. He has been on sandostatin for many years.  The exact details of his carcinoid are unknown, per records at [facility], a liver biopsy revealed stage IV carcinoid.  I do not have records regarding his surgery, staging. MRI (non contrasted) of the abdomen dated 12/01/2014 is documented above.   24 hour urine studies from November were reviewed with the patient, results are noted above.   He is here for ongoing Sandostatin for carcinoid. He is due today.   He is on ProCrit or Aranesp once monthly at [facility] with Dr. Burnett Sheng whom he follows with for his CKD. He has an upcoming appointment at the end of January.  He follows with urologist Dr. Exie Parody.  The patient notes that his prostate carcinoma is followed by Dr. Exie Parody.   He is up to date on colonoscopies.   He will return for a follow up in 1 month.   ORDERS PLACED FOR THIS ENCOUNTER:  No orders of the defined types were placed in this encounter.   MEDICATIONS PRESCRIBED THIS ENCOUNTER: Meds ordered this encounter  Medications  . hydrALAZINE (APRESOLINE) 25 MG tablet    Sig: TAKE THREE (3) TABLETS BY MOUTH TWICE DAILY    Refill:  99  . alendronate (FOSAMAX) 70 MG tablet    Sig: TAKE ONE TABLET BY MOUTH ONCE A WEEK IN THE MORNING WITH A FULL GLASS OF WATER ON AN EMPTY STOMACH. DO NOT LAY DOWN FOR 30 MINUTES.  Refill:   0    All questions were answered. The patient knows to call the clinic with any problems, questions or concerns.  This document serves as a record of services personally performed by Ancil Linsey, MD. It was created on her behalf by Martinique Casey, a trained medical scribe. The creation of this record is based on the scribe's personal observations and the provider's statements to them. This document has been checked and approved by the attending provider.  I have reviewed the above documentation for accuracy and completeness and I agree with the above.  This note was electronically signed.  Martinique M Casey  07/19/2016 1:15 PM

## 2016-07-19 NOTE — Progress Notes (Signed)
Allen Davenport presents today for injection per MD orders. Sandostatin administered IM in right Gluteal. Administration without incident. Patient tolerated well.

## 2016-07-21 LAB — CHROMOGRANIN A: CHROMOGRANIN A: 14 nmol/L — AB (ref 0–5)

## 2016-07-21 LAB — SEROTONIN SERUM: Serotonin, Serum: 124 ng/mL (ref 21–321)

## 2016-07-23 ENCOUNTER — Encounter (HOSPITAL_COMMUNITY): Payer: Self-pay | Admitting: Hematology & Oncology

## 2016-08-10 DIAGNOSIS — Z79899 Other long term (current) drug therapy: Secondary | ICD-10-CM | POA: Diagnosis not present

## 2016-08-10 DIAGNOSIS — E559 Vitamin D deficiency, unspecified: Secondary | ICD-10-CM | POA: Diagnosis not present

## 2016-08-10 DIAGNOSIS — N184 Chronic kidney disease, stage 4 (severe): Secondary | ICD-10-CM | POA: Diagnosis not present

## 2016-08-10 DIAGNOSIS — D509 Iron deficiency anemia, unspecified: Secondary | ICD-10-CM | POA: Diagnosis not present

## 2016-08-10 DIAGNOSIS — R809 Proteinuria, unspecified: Secondary | ICD-10-CM | POA: Diagnosis not present

## 2016-08-16 ENCOUNTER — Encounter (HOSPITAL_BASED_OUTPATIENT_CLINIC_OR_DEPARTMENT_OTHER): Payer: Medicare Other

## 2016-08-16 VITALS — BP 130/66 | HR 64 | Temp 98.3°F | Resp 18

## 2016-08-16 DIAGNOSIS — C7B02 Secondary carcinoid tumors of liver: Secondary | ICD-10-CM | POA: Diagnosis not present

## 2016-08-16 DIAGNOSIS — C7A00 Malignant carcinoid tumor of unspecified site: Secondary | ICD-10-CM

## 2016-08-16 DIAGNOSIS — E1140 Type 2 diabetes mellitus with diabetic neuropathy, unspecified: Secondary | ICD-10-CM | POA: Diagnosis not present

## 2016-08-16 DIAGNOSIS — E1151 Type 2 diabetes mellitus with diabetic peripheral angiopathy without gangrene: Secondary | ICD-10-CM | POA: Diagnosis not present

## 2016-08-16 MED ORDER — OCTREOTIDE ACETATE 30 MG IM KIT
30.0000 mg | PACK | Freq: Once | INTRAMUSCULAR | Status: AC
  Administered 2016-08-16: 30 mg via INTRAMUSCULAR

## 2016-08-16 NOTE — Patient Instructions (Signed)
[place] at [facility] Discharge Instructions  RECOMMENDATIONS MADE BY THE CONSULTANT AND ANY TEST RESULTS WILL BE SENT TO YOUR REFERRING PHYSICIAN.  Sandostatin 30 mg injection given as ordered. Return as scheduled.  Thank you for choosing [place] at [facility] to provide your oncology and hematology care.  To afford each patient quality time with our provider, please arrive at least 15 minutes before your scheduled appointment time.    If you have a lab appointment with the [facility] please come in thru the  Main Entrance and check in at the main information desk  You need to re-schedule your appointment should you arrive 10 or more minutes late.  We strive to give you quality time with our providers, and arriving late affects you and other patients whose appointments are after yours.  Also, if you no show three or more times for appointments you may be dismissed from the clinic at the providers discretion.     Again, thank you for choosing [facility].  Our hope is that these requests will decrease the amount of time that you wait before being seen by our physicians.       _____________________________________________________________  Should you have questions after your visit to [facility], please contact our office at [phone] between the hours of 8:30 a.m. and 4:30 p.m.  Voicemails left after 4:30 p.m. will not be returned until the following business day.  For prescription refill requests, have your pharmacy contact our office.       Resources For Cancer Patients and their Caregivers ? American Cancer Society: Can assist with transportation, wigs, general needs, runs Look Good Feel Better.        805-377-3764 ? Cancer Care: Provides financial assistance, online support groups, medication/co-pay assistance.  1-800-813-HOPE 8184725323) ? [place] Assists [place]  cancer patients and their families through emotional , educational and financial support.  719-408-7508 ? [place] DSS Where to apply for food stamps, Medicaid and utility assistance. (575) 878-8338 ? RCATS: Transportation to medical appointments. (657)337-7250 ? Social Security Administration: May apply for disability if have a Stage IV cancer. 424-543-0705 432-756-1123 ? [employer], Disability and Transit Services: Assists with nutrition, care and transit needs. [place] Support Programs: @10RELATIVEDAYS @ > Cancer Support Group  2nd Tuesday of the month 1pm-2pm, Journey Room  > Creative Journey  3rd Tuesday of the month 1130am-1pm, Journey Room  > Look Good Feel Better  1st Wednesday of the month 10am-12 noon, Journey Room (Call [place] to register [phone])

## 2016-08-16 NOTE — Progress Notes (Signed)
Allen Davenport presents today for injection per MD orders. Sandostatin 30 mg administered IM in left upper outer ventrogluteal. Administration without incident. Patient tolerated well.

## 2016-08-29 DIAGNOSIS — I509 Heart failure, unspecified: Secondary | ICD-10-CM | POA: Diagnosis not present

## 2016-08-29 DIAGNOSIS — N184 Chronic kidney disease, stage 4 (severe): Secondary | ICD-10-CM | POA: Diagnosis not present

## 2016-08-29 DIAGNOSIS — I10 Essential (primary) hypertension: Secondary | ICD-10-CM | POA: Diagnosis not present

## 2016-08-29 DIAGNOSIS — R809 Proteinuria, unspecified: Secondary | ICD-10-CM | POA: Diagnosis not present

## 2016-09-07 DIAGNOSIS — R809 Proteinuria, unspecified: Secondary | ICD-10-CM | POA: Diagnosis not present

## 2016-09-07 DIAGNOSIS — N184 Chronic kidney disease, stage 4 (severe): Secondary | ICD-10-CM | POA: Diagnosis not present

## 2016-09-07 DIAGNOSIS — D509 Iron deficiency anemia, unspecified: Secondary | ICD-10-CM | POA: Diagnosis not present

## 2016-09-07 DIAGNOSIS — E559 Vitamin D deficiency, unspecified: Secondary | ICD-10-CM | POA: Diagnosis not present

## 2016-09-07 DIAGNOSIS — Z79899 Other long term (current) drug therapy: Secondary | ICD-10-CM | POA: Diagnosis not present

## 2016-09-13 ENCOUNTER — Encounter (HOSPITAL_COMMUNITY): Payer: Medicare Other | Attending: Hematology & Oncology

## 2016-09-13 ENCOUNTER — Encounter (HOSPITAL_COMMUNITY): Payer: Self-pay

## 2016-09-13 VITALS — BP 129/77 | HR 55 | Resp 16

## 2016-09-13 DIAGNOSIS — C7B02 Secondary carcinoid tumors of liver: Secondary | ICD-10-CM

## 2016-09-13 DIAGNOSIS — C7A00 Malignant carcinoid tumor of unspecified site: Secondary | ICD-10-CM | POA: Diagnosis not present

## 2016-09-13 MED ORDER — OCTREOTIDE ACETATE 30 MG IM KIT
30.0000 mg | PACK | Freq: Once | INTRAMUSCULAR | Status: AC
  Administered 2016-09-13: 30 mg via INTRAMUSCULAR

## 2016-09-13 NOTE — Progress Notes (Signed)
Dereck A Vanorder presents today for injection per MD orders. Sandostatin given 30mg  IM given in the upper outer buttocks. Administration without incident. Patient tolerated well. Vitals stable and discharged home from clinic ambulatory. Follow up as scheduled.

## 2016-09-13 NOTE — Patient Instructions (Signed)
[place] at [facility] Discharge Instructions  RECOMMENDATIONS MADE BY THE CONSULTANT AND ANY TEST RESULTS WILL BE SENT TO YOUR REFERRING PHYSICIAN.  Sandostatin given today Follow up as scheduled.  Thank you for choosing [place] at [facility] to provide your oncology and hematology care.  To afford each patient quality time with our provider, please arrive at least 15 minutes before your scheduled appointment time.    If you have a lab appointment with the [place] please come in thru the  Main Entrance and check in at the main information desk  You need to re-schedule your appointment should you arrive 10 or more minutes late.  We strive to give you quality time with our providers, and arriving late affects you and other patients whose appointments are after yours.  Also, if you no show three or more times for appointments you may be dismissed from the clinic at the providers discretion.     Again, thank you for choosing [facility].  Our hope is that these requests will decrease the amount of time that you wait before being seen by our physicians.       _____________________________________________________________  Should you have questions after your visit to [facility], please contact our office at [phone] between the hours of 8:30 a.m. and 4:30 p.m.  Voicemails left after 4:30 p.m. will not be returned until the following business day.  For prescription refill requests, have your pharmacy contact our office.       Resources For Cancer Patients and their Caregivers ? American Cancer Society: Can assist with transportation, wigs, general needs, runs Look Good Feel Better.        ([phone] ? Cancer Care: Provides financial assistance, online support groups, medication/co-pay assistance.  1-800-813-HOPE 717-431-0640) ? [place] Assists [place] cancer patients and  their families through emotional , educational and financial support.  509-407-4165 ? [place] DSS Where to apply for food stamps, Medicaid and utility assistance. (316)241-6521 ? RCATS: Transportation to medical appointments. 713-146-0892 ? Social Security Administration: May apply for disability if have a Stage IV cancer. 906-082-9794 (571) 348-3076 ? [employer], Disability and Transit Services: Assists with nutrition, care and transit needs. [place] Support Programs: @10RELATIVEDAYS @ > Cancer Support Group  2nd Tuesday of the month 1pm-2pm, Journey Room  > Creative Journey  3rd Tuesday of the month 1130am-1pm, Journey Room  > Look Good Feel Better  1st Wednesday of the month 10am-12 noon, Journey Room (Call [place] to register [phone])

## 2016-09-15 DIAGNOSIS — N184 Chronic kidney disease, stage 4 (severe): Secondary | ICD-10-CM | POA: Diagnosis not present

## 2016-09-15 DIAGNOSIS — I10 Essential (primary) hypertension: Secondary | ICD-10-CM | POA: Diagnosis not present

## 2016-09-15 DIAGNOSIS — M818 Other osteoporosis without current pathological fracture: Secondary | ICD-10-CM | POA: Diagnosis not present

## 2016-09-15 DIAGNOSIS — E784 Other hyperlipidemia: Secondary | ICD-10-CM | POA: Diagnosis not present

## 2016-10-05 DIAGNOSIS — N184 Chronic kidney disease, stage 4 (severe): Secondary | ICD-10-CM | POA: Diagnosis not present

## 2016-10-05 DIAGNOSIS — R809 Proteinuria, unspecified: Secondary | ICD-10-CM | POA: Diagnosis not present

## 2016-10-05 DIAGNOSIS — E559 Vitamin D deficiency, unspecified: Secondary | ICD-10-CM | POA: Diagnosis not present

## 2016-10-05 DIAGNOSIS — D509 Iron deficiency anemia, unspecified: Secondary | ICD-10-CM | POA: Diagnosis not present

## 2016-10-05 DIAGNOSIS — Z79899 Other long term (current) drug therapy: Secondary | ICD-10-CM | POA: Diagnosis not present

## 2016-10-10 ENCOUNTER — Other Ambulatory Visit (HOSPITAL_COMMUNITY): Payer: Self-pay | Admitting: *Deleted

## 2016-10-10 DIAGNOSIS — C7A00 Malignant carcinoid tumor of unspecified site: Secondary | ICD-10-CM

## 2016-10-10 NOTE — Progress Notes (Addendum)
[place], [place] [address]   CLINIC:  Medical Oncology/Hematology  PCP:  [name], MD [place] [id number] [phone]   REASON FOR VISIT:  Follow-up for malignant carcinoid tumor AND h/o H&N cancer, kidney cancer, and prostate cancer.   CURRENT THERAPY: Sandostatin injection every 28 days AND Observation    BRIEF ONCOLOGIC HISTORY:  (From Dr. Donald Davenport last note on 07/19/16)    HISTORY OF PRESENT ILLNESS:  (From Dr. Donald Davenport last note on 07/19/16)      INTERVAL HISTORY:  Allen Davenport 76 y.o. male presents for continued follow-up of carcinoid tumor on Sandostatin, as well as history of multiple malignancies.   His urologist, Dr. Exie Davenport, is no longer in practice in the [place] area; [name] tells me his care is being transferred to [facility] here in [place] and he is waiting for a call soon for a follow-up; he is due to be seen in May.  He maintains follow-up with his nephrologist, Dr. Lowanda Davenport, every 3 months or so.  He tells me he was getting injections "for my blood counts, but they were over 11 and they stopped the shots. They put me on a pill that I take once per day and my counts are better."  He is unsure about the names of his medications.  (I presume he was getting Aranesp/Procrit injections).   Overall, he feels well. His energy levels and appetite are good. Denies fever/chills, shortness of breath, cough, or abdominal pain. His bowels move well; denies dysuria/hematuria.   For fun, he likes to do his own yard work and spend time outside. He is a retired Administrator. He is married to his wife of 40+ years.    REVIEW OF SYSTEMS:  Review of Systems  Constitutional: Negative.  Negative for chills, fatigue and fever.  HENT:  Negative.  Negative for lump/mass and nosebleeds.   Eyes: Negative.   Respiratory: Negative.  Negative for cough and shortness of breath.   Cardiovascular: Negative.   Negative for chest pain and leg swelling.  Gastrointestinal: Negative.  Negative for abdominal pain, blood in stool, constipation, diarrhea, nausea and vomiting.  Endocrine: Negative.   Genitourinary: Negative.  Negative for dysuria and hematuria.   Musculoskeletal: Negative.  Negative for arthralgias.  Skin: Negative.  Negative for rash.  Neurological: Negative.  Negative for dizziness and headaches.  Hematological: Negative.  Negative for adenopathy. Does not bruise/bleed easily.  Psychiatric/Behavioral: Negative.  Negative for depression and sleep disturbance. The patient is not nervous/anxious.      PAST MEDICAL/SURGICAL HISTORY:  Past Medical History:  Diagnosis Date  . Anemia   . Cancer [facility])    right renal . Prostate- Radiation treatment  . CHF (congestive heart failure) ([place])   . Chronic kidney disease    stage 4  . Edema   . Heart murmur    "nothing to worry about"  . Hyperlipidemia   . Hypertension   . Malignant carcinoid tumor ([place]) 01/03/2016   Past Surgical History:  Procedure Laterality Date  . AV FISTULA PLACEMENT Left 10/20/2014   Procedure: Left Arm ARTERIOVENOUS (AV) FISTULA CREATION;  Surgeon: Elam Dutch, MD;  Location: [facility];  Service: Vascular;  Laterality: Left;  . NEPHRECTOMY Right 2010     SOCIAL HISTORY:  Social History   Social History  . Marital status: Married    Spouse name: N/A  . Number of children: N/A  . Years of  education: N/A   Occupational History  . Not on file.   Social History Main Topics  . Smoking status: Never Smoker  . Smokeless tobacco: Never Used  . Alcohol use No  . Drug use: No  . Sexual activity: Not on file   Other Topics Concern  . Not on file   Social History Narrative  . No narrative on file    FAMILY HISTORY:  Family History  Problem Relation Age of Onset  . Cancer Mother   . Hypertension Father   . Cancer Sister     CURRENT MEDICATIONS:  Outpatient Encounter Prescriptions as of 10/11/2016    Medication Sig Note  . alendronate (FOSAMAX) 70 MG tablet TAKE ONE TABLET BY MOUTH ONCE A WEEK IN THE MORNING WITH A FULL GLASS OF WATER ON AN EMPTY STOMACH. DO NOT LAY DOWN FOR 30 MINUTES. 07/19/2016: Received from: External Pharmacy  . atorvastatin (LIPITOR) 80 MG tablet Take 80 mg by mouth daily.   . calcitRIOL (ROCALTROL) 0.25 MCG capsule  04/26/2016: Received from: External Pharmacy  . cholecalciferol (VITAMIN D) 1000 UNITS tablet Take 1,000 Units by mouth daily.   . furosemide (LASIX) 40 MG tablet Take 40 mg by mouth.   . hydrALAZINE (APRESOLINE) 25 MG tablet TAKE THREE (3) TABLETS BY MOUTH TWICE DAILY 07/19/2016: Received from: External Pharmacy  . hydrALAZINE (APRESOLINE) 50 MG tablet Take 50 mg by mouth 2 (two) times daily.   [name] labetalol (NORMODYNE) 300 MG tablet Take 300 mg by mouth 2 (two) times daily.   . Omega-3 Fatty Acids (FISH OIL) 1000 MG CAPS Take 1 capsule by mouth daily.   . sodium bicarbonate 650 MG tablet Take 650 mg by mouth daily.   . tamsulosin (FLOMAX) 0.4 MG CAPS capsule  04/26/2016: Received from: External Pharmacy   No facility-administered encounter medications on file as of 10/11/2016.     ALLERGIES:  No Known Allergies   PHYSICAL EXAM:  ECOG Performance status: 0 - Asymptomatic   Vitals:   10/11/16 1139  BP: 129/68  Pulse: (!) 59  Resp: 16  Temp: 98 F (36.7 C)   Filed Weights   10/11/16 1139  Weight: 213 lb 6.4 oz (96.8 kg)    Physical Exam  Constitutional: He is oriented to person, place, and time and well-developed, well-nourished, and in no distress.  HENT:  Head: Normocephalic.  Mouth/Throat: Oropharynx is clear and moist. No oropharyngeal exudate.  Eyes: Conjunctivae are normal. Pupils are equal, round, and reactive to light. No scleral icterus.  Neck: Normal range of motion. Neck supple.  Cardiovascular: Normal rate, regular rhythm and normal heart sounds.   Pulmonary/Chest: Effort normal and breath sounds normal. No respiratory  distress.  Abdominal: Soft. Bowel sounds are normal. There is no tenderness. There is no rebound.  Musculoskeletal: Normal range of motion. He exhibits no edema.  Lymphadenopathy:    He has no cervical adenopathy.       Right: No supraclavicular adenopathy present.       Left: No supraclavicular adenopathy present.  Neurological: He is alert and oriented to person, place, and time. No cranial nerve deficit. Gait normal.  Skin: Skin is warm and dry. No rash noted.  Psychiatric: Mood, memory, affect and judgment normal.  Nursing note and vitals reviewed.    LABORATORY DATA:  I have reviewed the labs as listed.  CBC    Component Value Date/Time   WBC 3.5 (L) 10/11/2016 0957   RBC 3.69 (L) 10/11/2016 0957   HGB 11.1 (  L) 10/11/2016 0957   HCT 33.5 (L) 10/11/2016 0957   PLT 119 (L) 10/11/2016 0957   MCV 90.8 10/11/2016 0957   MCH 30.1 10/11/2016 0957   MCHC 33.1 10/11/2016 0957   RDW 13.9 10/11/2016 0957   LYMPHSABS 0.5 (L) 10/11/2016 0957   MONOABS 0.3 10/11/2016 0957   EOSABS 0.1 10/11/2016 0957   BASOSABS 0.0 10/11/2016 0957   CMP Latest Ref Rng & Units 10/11/2016 07/19/2016 04/26/2016  Glucose 65 - 99 mg/dL 81 98 82  BUN 6 - 20 mg/dL 49(H) 54(H) 52(H)  Creatinine 0.61 - 1.24 mg/dL 3.84(H) 3.95(H) 4.15(H)  Sodium 135 - 145 mmol/L 136 136 138  Potassium 3.5 - 5.1 mmol/L 4.2 4.4 4.3  Chloride 101 - 111 mmol/L 104 103 107  CO2 22 - 32 mmol/L 26 26 26   Calcium 8.9 - 10.3 mg/dL 8.7(L) 9.0 8.5(L)  Total Protein 6.5 - 8.1 g/dL 6.1(L) 6.3(L) 6.6  Total Bilirubin 0.3 - 1.2 mg/dL 0.8 0.7 0.7  Alkaline Phos 38 - 126 U/L 55 53 47  AST 15 - 41 U/L 41 58(H) 48(H)  ALT 17 - 63 U/L 23 29 26     PENDING LABS:    DIAGNOSTIC IMAGING:  MRI abd: 12/01/14 [facility])       PATHOLOGY:     ASSESSMENT & PLAN:   Malignant carcinoid tumor:  -Reportedly 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  Previously managed at [facility] in [place], [place].  Mr. Kozuch transferred his care to [facility] in 04/2016.  -We have been monitoring chromogranin A and serum serotonin levels; he understands that these tests are not specific for just carcinoid tumors and can be elevated in other clinical scenarios. However, it can be helpful to monitor the trends over time. We will continue to monitor per Dr. Donald Davenport recommendations.  -Continue Sandostatin monthly.  -Return to cancer center in 3 months.       Pancytopenia:  -Chronic cytopenias noted.  -Labs reviewed with patient today and are notable for mild pancytopenia. He does have history of iron deficiency anemia; unclear if "the pill" he is referring to is an oral iron supplement.  I will add anemia panel & serum copper to his labs for when he returns next visit.   History of prostate cancer: -Diagnosed in 2016; treated with radiation therapy with Dr. Tammi Davenport. -Maintain follow-up with urology.   History of kidney cancer: -Diagnosed in 2010; treated with right nephrectomy at [facility] follow-up with nephrology.   History of H&N cancer: -Diagnosed in 2009; treated with surgery and radiation therapy.  -No evidence of recurrent disease on physical exam. We will continue to monitor.       Dispo:  -Continue monthly Sandostatin.  -Return to cancer center in 3 months with labs.    All questions were answered to patient's stated satisfaction. Encouraged patient to call with any new concerns or questions before his next visit to the cancer center and we can certain see him sooner, if needed.      Orders placed this encounter:  Orders Placed This Encounter  Procedures  . Vitamin B12  . Folate  . Iron and TIBC  . Ferritin  . CBC with Differential/Platelet  . Comprehensive metabolic panel  . Serotonin serum  . Chromogranin A      Mike Craze, NP [place] [phone]

## 2016-10-11 ENCOUNTER — Encounter (HOSPITAL_COMMUNITY): Payer: Medicare Other

## 2016-10-11 ENCOUNTER — Encounter (HOSPITAL_COMMUNITY): Payer: Medicare Other | Attending: Oncology

## 2016-10-11 ENCOUNTER — Encounter (HOSPITAL_COMMUNITY): Payer: Medicare Other | Attending: Adult Health | Admitting: Adult Health

## 2016-10-11 ENCOUNTER — Ambulatory Visit (HOSPITAL_COMMUNITY): Payer: Medicare Other | Admitting: Hematology & Oncology

## 2016-10-11 ENCOUNTER — Other Ambulatory Visit (HOSPITAL_COMMUNITY): Payer: Medicare Other

## 2016-10-11 ENCOUNTER — Encounter (HOSPITAL_COMMUNITY): Payer: Self-pay | Admitting: Adult Health

## 2016-10-11 VITALS — BP 129/68 | HR 59 | Temp 98.0°F | Resp 16 | Ht 77.0 in | Wt 213.4 lb

## 2016-10-11 DIAGNOSIS — C61 Malignant neoplasm of prostate: Secondary | ICD-10-CM

## 2016-10-11 DIAGNOSIS — Z85528 Personal history of other malignant neoplasm of kidney: Secondary | ICD-10-CM | POA: Diagnosis not present

## 2016-10-11 DIAGNOSIS — Z8546 Personal history of malignant neoplasm of prostate: Secondary | ICD-10-CM

## 2016-10-11 DIAGNOSIS — N183 Chronic kidney disease, stage 3 unspecified: Secondary | ICD-10-CM

## 2016-10-11 DIAGNOSIS — D61818 Other pancytopenia: Secondary | ICD-10-CM | POA: Diagnosis not present

## 2016-10-11 DIAGNOSIS — C7B02 Secondary carcinoid tumors of liver: Secondary | ICD-10-CM

## 2016-10-11 DIAGNOSIS — Z8589 Personal history of malignant neoplasm of other organs and systems: Secondary | ICD-10-CM | POA: Diagnosis not present

## 2016-10-11 DIAGNOSIS — C7A00 Malignant carcinoid tumor of unspecified site: Secondary | ICD-10-CM

## 2016-10-11 DIAGNOSIS — D509 Iron deficiency anemia, unspecified: Secondary | ICD-10-CM

## 2016-10-11 DIAGNOSIS — D631 Anemia in chronic kidney disease: Secondary | ICD-10-CM

## 2016-10-11 DIAGNOSIS — C641 Malignant neoplasm of right kidney, except renal pelvis: Secondary | ICD-10-CM

## 2016-10-11 LAB — COMPREHENSIVE METABOLIC PANEL
ALBUMIN: 3.4 g/dL — AB (ref 3.5–5.0)
ALT: 23 U/L (ref 17–63)
ANION GAP: 6 (ref 5–15)
AST: 41 U/L (ref 15–41)
Alkaline Phosphatase: 55 U/L (ref 38–126)
BUN: 49 mg/dL — ABNORMAL HIGH (ref 6–20)
CALCIUM: 8.7 mg/dL — AB (ref 8.9–10.3)
CHLORIDE: 104 mmol/L (ref 101–111)
CO2: 26 mmol/L (ref 22–32)
CREATININE: 3.84 mg/dL — AB (ref 0.61–1.24)
GFR calc non Af Amer: 14 mL/min — ABNORMAL LOW (ref >60)
GFR, EST AFRICAN AMERICAN: 16 mL/min — AB (ref >60)
GLUCOSE: 81 mg/dL (ref 65–99)
POTASSIUM: 4.2 mmol/L (ref 3.5–5.1)
SODIUM: 136 mmol/L (ref 135–145)
TOTAL PROTEIN: 6.1 g/dL — AB (ref 6.5–8.1)
Total Bilirubin: 0.8 mg/dL (ref 0.3–1.2)

## 2016-10-11 LAB — CBC WITH DIFFERENTIAL/PLATELET
BASOS PCT: 1 %
Basophils Absolute: 0 10*3/uL (ref 0.0–0.1)
EOS ABS: 0.1 10*3/uL (ref 0.0–0.7)
EOS PCT: 4 %
HCT: 33.5 % — ABNORMAL LOW (ref 39.0–52.0)
HEMOGLOBIN: 11.1 g/dL — AB (ref 13.0–17.0)
LYMPHS ABS: 0.5 10*3/uL — AB (ref 0.7–4.0)
Lymphocytes Relative: 15 %
MCH: 30.1 pg (ref 26.0–34.0)
MCHC: 33.1 g/dL (ref 30.0–36.0)
MCV: 90.8 fL (ref 78.0–100.0)
MONOS PCT: 10 %
Monocytes Absolute: 0.3 10*3/uL (ref 0.1–1.0)
NEUTROS PCT: 72 %
Neutro Abs: 2.5 10*3/uL (ref 1.7–7.7)
PLATELETS: 119 10*3/uL — AB (ref 150–400)
RBC: 3.69 MIL/uL — ABNORMAL LOW (ref 4.22–5.81)
RDW: 13.9 % (ref 11.5–15.5)
WBC: 3.5 10*3/uL — ABNORMAL LOW (ref 4.0–10.5)

## 2016-10-11 MED ORDER — OCTREOTIDE ACETATE 30 MG IM KIT
PACK | INTRAMUSCULAR | Status: AC
  Filled 2016-10-11: qty 1

## 2016-10-11 MED ORDER — OCTREOTIDE ACETATE 30 MG IM KIT
30.0000 mg | PACK | Freq: Once | INTRAMUSCULAR | Status: AC
  Administered 2016-10-11: 30 mg via INTRAMUSCULAR

## 2016-10-11 NOTE — Progress Notes (Signed)
Allen Davenport presents today for injection per MD orders. Sandostatin administered IM in left Gluteal. Administration without incident. Patient tolerated well.

## 2016-10-11 NOTE — Patient Instructions (Addendum)
[place] at [facility] Discharge Instructions  RECOMMENDATIONS MADE BY THE CONSULTANT AND ANY TEST RESULTS WILL BE SENT TO YOUR REFERRING PHYSICIAN.  You were seen today by Mike Craze NP. Continue monthly injections. Return in 3 months for follow up.   Thank you for choosing [place] at [facility] to provide your oncology and hematology care.  To afford each patient quality time with our provider, please arrive at least 15 minutes before your scheduled appointment time.    If you have a lab appointment with the [facility] please come in thru the  Main Entrance and check in at the main information desk  You need to re-schedule your appointment should you arrive 10 or more minutes late.  We strive to give you quality time with our providers, and arriving late affects you and other patients whose appointments are after yours.  Also, if you no show three or more times for appointments you may be dismissed from the clinic at the providers discretion.     Again, thank you for choosing [facility].  Our hope is that these requests will decrease the amount of time that you wait before being seen by our physicians.       _____________________________________________________________  Should you have questions after your visit to [facility], please contact our office at [phone] between the hours of 8:30 a.m. and 4:30 p.m.  Voicemails left after 4:30 p.m. will not be returned until the following business day.  For prescription refill requests, have your pharmacy contact our office.       Resources For Cancer Patients and their Caregivers ? American Cancer Society: Can assist with transportation, wigs, general needs, runs Look Good Feel Better.        940-278-3238 ? Cancer Care: Provides financial assistance, online support groups, medication/co-pay assistance.  1-800-813-HOPE 220 681 9513) ? [place] Assists [place] cancer patients and their families through emotional , educational and financial support.  867-604-3776 ? [place] DSS Where to apply for food stamps, Medicaid and utility assistance. ([phone] ? RCATS: Transportation to medical appointments. 684-610-3395 ? Social Security Administration: May apply for disability if have a Stage IV cancer. 249-445-3654 323-239-9008 ? [employer], Disability and Transit Services: Assists with nutrition, care and transit needs. [place] Support Programs: @10RELATIVEDAYS @ > Cancer Support Group  2nd Tuesday of the month 1pm-2pm, Journey Room  > Creative Journey  3rd Tuesday of the month 1130am-1pm, Journey Room  > Look Good Feel Better  1st Wednesday of the month 10am-12 noon, Journey Room (Call [place] to register [phone])

## 2016-10-11 NOTE — Patient Instructions (Signed)
[place] at [facility] Discharge Instructions  RECOMMENDATIONS MADE BY THE CONSULTANT AND ANY TEST RESULTS WILL BE SENT TO YOUR REFERRING PHYSICIAN.  Sandostatin today.    Thank you for choosing [place] at [facility] to provide your oncology and hematology care.  To afford each patient quality time with our provider, please arrive at least 15 minutes before your scheduled appointment time.    If you have a lab appointment with the [facility] please come in thru the  Main Entrance and check in at the main information desk  You need to re-schedule your appointment should you arrive 10 or more minutes late.  We strive to give you quality time with our providers, and arriving late affects you and other patients whose appointments are after yours.  Also, if you no show three or more times for appointments you may be dismissed from the clinic at the providers discretion.     Again, thank you for choosing [facility].  Our hope is that these requests will decrease the amount of time that you wait before being seen by our physicians.       _____________________________________________________________  Should you have questions after your visit to [facility], please contact our office at [phone] between the hours of 8:30 a.m. and 4:30 p.m.  Voicemails left after 4:30 p.m. will not be returned until the following business day.  For prescription refill requests, have your pharmacy contact our office.       Resources For Cancer Patients and their Caregivers ? American Cancer Society: Can assist with transportation, wigs, general needs, runs Look Good Feel Better.        845-522-9427 ? Cancer Care: Provides financial assistance, online support groups, medication/co-pay assistance.  1-800-813-HOPE 351-045-8311) ? [place] Assists [place] cancer patients and their families through  emotional , educational and financial support.  (803) 534-1340 ? [place] DSS Where to apply for food stamps, Medicaid and utility assistance. (204)406-5482 ? RCATS: Transportation to medical appointments. 907-286-4885 ? Social Security Administration: May apply for disability if have a Stage IV cancer. 5345413581 8737951182 ? [employer], Disability and Transit Services: Assists with nutrition, care and transit needs. [place] Support Programs: @10RELATIVEDAYS @ > Cancer Support Group  2nd Tuesday of the month 1pm-2pm, Journey Room  > Creative Journey  3rd Tuesday of the month 1130am-1pm, Journey Room  > Look Good Feel Better  1st Wednesday of the month 10am-12 noon, Journey Room (Call [place] to register [phone])

## 2016-10-12 LAB — CHROMOGRANIN A: CHROMOGRANIN A: 19 nmol/L — AB (ref 0–5)

## 2016-10-13 LAB — SEROTONIN SERUM: Serotonin, Serum: 336 ng/mL — ABNORMAL HIGH (ref 21–321)

## 2016-10-17 DIAGNOSIS — M818 Other osteoporosis without current pathological fracture: Secondary | ICD-10-CM | POA: Diagnosis not present

## 2016-10-17 DIAGNOSIS — Z1389 Encounter for screening for other disorder: Secondary | ICD-10-CM | POA: Diagnosis not present

## 2016-10-17 DIAGNOSIS — E784 Other hyperlipidemia: Secondary | ICD-10-CM | POA: Diagnosis not present

## 2016-10-17 DIAGNOSIS — I10 Essential (primary) hypertension: Secondary | ICD-10-CM | POA: Diagnosis not present

## 2016-10-17 DIAGNOSIS — N184 Chronic kidney disease, stage 4 (severe): Secondary | ICD-10-CM | POA: Diagnosis not present

## 2016-10-17 DIAGNOSIS — Z131 Encounter for screening for diabetes mellitus: Secondary | ICD-10-CM | POA: Diagnosis not present

## 2016-10-17 DIAGNOSIS — Z0000 Encounter for general adult medical examination without abnormal findings: Secondary | ICD-10-CM | POA: Diagnosis not present

## 2016-11-01 DIAGNOSIS — E1140 Type 2 diabetes mellitus with diabetic neuropathy, unspecified: Secondary | ICD-10-CM | POA: Diagnosis not present

## 2016-11-01 DIAGNOSIS — E1151 Type 2 diabetes mellitus with diabetic peripheral angiopathy without gangrene: Secondary | ICD-10-CM | POA: Diagnosis not present

## 2016-11-02 DIAGNOSIS — D631 Anemia in chronic kidney disease: Secondary | ICD-10-CM | POA: Diagnosis not present

## 2016-11-02 DIAGNOSIS — N184 Chronic kidney disease, stage 4 (severe): Secondary | ICD-10-CM | POA: Diagnosis not present

## 2016-11-02 DIAGNOSIS — E559 Vitamin D deficiency, unspecified: Secondary | ICD-10-CM | POA: Diagnosis not present

## 2016-11-02 DIAGNOSIS — Z79899 Other long term (current) drug therapy: Secondary | ICD-10-CM | POA: Diagnosis not present

## 2016-11-02 DIAGNOSIS — R809 Proteinuria, unspecified: Secondary | ICD-10-CM | POA: Diagnosis not present

## 2016-11-07 DIAGNOSIS — N184 Chronic kidney disease, stage 4 (severe): Secondary | ICD-10-CM | POA: Diagnosis not present

## 2016-11-07 DIAGNOSIS — E872 Acidosis: Secondary | ICD-10-CM | POA: Diagnosis not present

## 2016-11-07 DIAGNOSIS — N250 Renal osteodystrophy: Secondary | ICD-10-CM | POA: Diagnosis not present

## 2016-11-07 DIAGNOSIS — I10 Essential (primary) hypertension: Secondary | ICD-10-CM | POA: Diagnosis not present

## 2016-11-07 DIAGNOSIS — I509 Heart failure, unspecified: Secondary | ICD-10-CM | POA: Diagnosis not present

## 2016-11-07 DIAGNOSIS — R809 Proteinuria, unspecified: Secondary | ICD-10-CM | POA: Diagnosis not present

## 2016-11-08 ENCOUNTER — Encounter (HOSPITAL_COMMUNITY): Payer: Self-pay

## 2016-11-08 ENCOUNTER — Encounter (HOSPITAL_COMMUNITY): Payer: Medicare Other | Attending: Hematology & Oncology

## 2016-11-08 VITALS — BP 129/65 | HR 57 | Temp 98.0°F | Resp 18

## 2016-11-08 DIAGNOSIS — E340 Carcinoid syndrome: Secondary | ICD-10-CM

## 2016-11-08 DIAGNOSIS — C7B02 Secondary carcinoid tumors of liver: Secondary | ICD-10-CM | POA: Diagnosis not present

## 2016-11-08 DIAGNOSIS — C7A00 Malignant carcinoid tumor of unspecified site: Secondary | ICD-10-CM | POA: Diagnosis not present

## 2016-11-08 MED ORDER — OCTREOTIDE ACETATE 30 MG IM KIT
30.0000 mg | PACK | Freq: Once | INTRAMUSCULAR | Status: AC
  Administered 2016-11-08: 30 mg via INTRAMUSCULAR

## 2016-11-08 NOTE — Patient Instructions (Signed)
[place] at [facility] Discharge Instructions  RECOMMENDATIONS MADE BY THE CONSULTANT AND ANY TEST RESULTS WILL BE SENT TO YOUR REFERRING PHYSICIAN.  Sandostatin injection today. Return as scheduled.   Thank you for choosing [place] at [facility] to provide your oncology and hematology care.  To afford each patient quality time with our provider, please arrive at least 15 minutes before your scheduled appointment time.    If you have a lab appointment with the [facility] please come in thru the  Main Entrance and check in at the main information desk  You need to re-schedule your appointment should you arrive 10 or more minutes late.  We strive to give you quality time with our providers, and arriving late affects you and other patients whose appointments are after yours.  Also, if you no show three or more times for appointments you may be dismissed from the clinic at the providers discretion.     Again, thank you for choosing [facility].  Our hope is that these requests will decrease the amount of time that you wait before being seen by our physicians.       _____________________________________________________________  Should you have questions after your visit to [facility], please contact our office at [phone] between the hours of 8:30 a.m. and 4:30 p.m.  Voicemails left after 4:30 p.m. will not be returned until the following business day.  For prescription refill requests, have your pharmacy contact our office.       Resources For Cancer Patients and their Caregivers ? American Cancer Society: Can assist with transportation, wigs, general needs, runs Look Good Feel Better.        ([phone] ? Cancer Care: Provides financial assistance, online support groups, medication/co-pay assistance.  1-800-813-HOPE (867)613-4784) ? [place] Assists [place] cancer patients  and their families through emotional , educational and financial support.  667-157-5951 ? [place] DSS Where to apply for food stamps, Medicaid and utility assistance. [phone] ? RCATS: Transportation to medical appointments. 386-306-8661 ? Social Security Administration: May apply for disability if have a Stage IV cancer. 317-677-7964 832-358-6199 ? [employer], Disability and Transit Services: Assists with nutrition, care and transit needs. [place] Support Programs: @10RELATIVEDAYS @ > Cancer Support Group  2nd Tuesday of the month 1pm-2pm, Journey Room  > Creative Journey  3rd Tuesday of the month 1130am-1pm, Journey Room  > Look Good Feel Better  1st Wednesday of the month 10am-12 noon, Journey Room (Call [place] to register [phone])

## 2016-11-08 NOTE — Progress Notes (Signed)
Allen Davenport presents today for injection per the provider's orders.  Sandostatin administration without incident; see MAR for injection details.  Patient tolerated procedure well and without incident.  No questions or complaints noted at this time.

## 2016-11-28 DIAGNOSIS — Z8521 Personal history of malignant neoplasm of larynx: Secondary | ICD-10-CM | POA: Diagnosis not present

## 2016-11-28 DIAGNOSIS — H6121 Impacted cerumen, right ear: Secondary | ICD-10-CM | POA: Diagnosis not present

## 2016-11-30 DIAGNOSIS — D631 Anemia in chronic kidney disease: Secondary | ICD-10-CM | POA: Diagnosis not present

## 2016-11-30 DIAGNOSIS — N184 Chronic kidney disease, stage 4 (severe): Secondary | ICD-10-CM | POA: Diagnosis not present

## 2016-12-06 ENCOUNTER — Encounter (HOSPITAL_COMMUNITY): Payer: Medicare Other | Attending: Hematology & Oncology

## 2016-12-06 VITALS — BP 123/68 | HR 55 | Temp 97.9°F | Resp 18

## 2016-12-06 DIAGNOSIS — C7A00 Malignant carcinoid tumor of unspecified site: Secondary | ICD-10-CM | POA: Diagnosis not present

## 2016-12-06 DIAGNOSIS — E340 Carcinoid syndrome: Secondary | ICD-10-CM | POA: Diagnosis present

## 2016-12-06 DIAGNOSIS — C7B02 Secondary carcinoid tumors of liver: Secondary | ICD-10-CM

## 2016-12-06 MED ORDER — OCTREOTIDE ACETATE 30 MG IM KIT
30.0000 mg | PACK | Freq: Once | INTRAMUSCULAR | Status: AC
  Administered 2016-12-06: 30 mg via INTRAMUSCULAR

## 2016-12-06 NOTE — Progress Notes (Signed)
Allen Davenport presents today for injection per MD orders. Sandostatin  30 mg administered IM in left upper buttocks Administration without incident. Patient tolerated well. Vitals stable and discharged home ambulatory. Follow up as scheduled.

## 2016-12-06 NOTE — Patient Instructions (Signed)
[place] at [facility] Discharge Instructions  RECOMMENDATIONS MADE BY THE CONSULTANT AND ANY TEST RESULTS WILL BE SENT TO YOUR REFERRING PHYSICIAN.  Sandostatin injection given Follow up as scheduled.  Thank you for choosing [place] at [facility] to provide your oncology and hematology care.  To afford each patient quality time with our provider, please arrive at least 15 minutes before your scheduled appointment time.    If you have a lab appointment with the [facility] please come in thru the  Main Entrance and check in at the main information desk  You need to re-schedule your appointment should you arrive 10 or more minutes late.  We strive to give you quality time with our providers, and arriving late affects you and other patients whose appointments are after yours.  Also, if you no show three or more times for appointments you may be dismissed from the clinic at the providers discretion.     Again, thank you for choosing [facility].  Our hope is that these requests will decrease the amount of time that you wait before being seen by our physicians.       _____________________________________________________________  Should you have questions after your visit to [facility], please contact our office at [phone] between the hours of 8:30 a.m. and 4:30 p.m.  Voicemails left after 4:30 p.m. will not be returned until the following business day.  For prescription refill requests, have your pharmacy contact our office.       Resources For Cancer Patients and their Caregivers ? American Cancer Society: Can assist with transportation, wigs, general needs, runs Look Good Feel Better.        ([phone] ? Cancer Care: Provides financial assistance, online support groups, medication/co-pay assistance.  1-800-813-HOPE 860 647 1398) ? [place] Assists [place] cancer patients  and their families through emotional , educational and financial support.  (409) [phone] ? [place] DSS Where to apply for food stamps, Medicaid and utility assistance. [phone] ? RCATS: Transportation to medical appointments. 984-422-1350 ? Social Security Administration: May apply for disability if have a Stage IV cancer. 760-713-2982 [phone]) [phone] ? [employer], Disability and Transit Services: Assists with nutrition, care and transit needs. [place] Support Programs: @10RELATIVEDAYS @ > Cancer Support Group  2nd Tuesday of the month 1pm-2pm, Journey Room  > Creative Journey  3rd Tuesday of the month 1130am-1pm, Journey Room  > Look Good Feel Better  1st Wednesday of the month 10am-12 noon, Journey Room (Call [place] to register [phone])

## 2016-12-18 DIAGNOSIS — I10 Essential (primary) hypertension: Secondary | ICD-10-CM | POA: Diagnosis not present

## 2016-12-18 DIAGNOSIS — N183 Chronic kidney disease, stage 3 (moderate): Secondary | ICD-10-CM | POA: Diagnosis not present

## 2016-12-18 DIAGNOSIS — M818 Other osteoporosis without current pathological fracture: Secondary | ICD-10-CM | POA: Diagnosis not present

## 2016-12-28 DIAGNOSIS — D631 Anemia in chronic kidney disease: Secondary | ICD-10-CM | POA: Diagnosis not present

## 2016-12-28 DIAGNOSIS — N184 Chronic kidney disease, stage 4 (severe): Secondary | ICD-10-CM | POA: Diagnosis not present

## 2017-01-02 ENCOUNTER — Ambulatory Visit (INDEPENDENT_AMBULATORY_CARE_PROVIDER_SITE_OTHER): Payer: Medicare Other | Admitting: Urology

## 2017-01-02 DIAGNOSIS — C61 Malignant neoplasm of prostate: Secondary | ICD-10-CM | POA: Diagnosis not present

## 2017-01-03 ENCOUNTER — Encounter (HOSPITAL_BASED_OUTPATIENT_CLINIC_OR_DEPARTMENT_OTHER): Payer: Medicare Other

## 2017-01-03 ENCOUNTER — Encounter (HOSPITAL_COMMUNITY): Payer: Medicare Other | Attending: Hematology & Oncology | Admitting: Oncology

## 2017-01-03 ENCOUNTER — Encounter (HOSPITAL_COMMUNITY): Payer: Self-pay

## 2017-01-03 ENCOUNTER — Encounter (HOSPITAL_COMMUNITY): Payer: Medicare Other

## 2017-01-03 VITALS — BP 115/62 | HR 97 | Temp 97.8°F | Resp 16 | Ht 77.0 in | Wt 212.0 lb

## 2017-01-03 DIAGNOSIS — C7B02 Secondary carcinoid tumors of liver: Secondary | ICD-10-CM | POA: Diagnosis not present

## 2017-01-03 DIAGNOSIS — N183 Chronic kidney disease, stage 3 (moderate): Secondary | ICD-10-CM

## 2017-01-03 DIAGNOSIS — Z8546 Personal history of malignant neoplasm of prostate: Secondary | ICD-10-CM

## 2017-01-03 DIAGNOSIS — Z85528 Personal history of other malignant neoplasm of kidney: Secondary | ICD-10-CM | POA: Diagnosis not present

## 2017-01-03 DIAGNOSIS — D509 Iron deficiency anemia, unspecified: Secondary | ICD-10-CM

## 2017-01-03 DIAGNOSIS — D631 Anemia in chronic kidney disease: Secondary | ICD-10-CM

## 2017-01-03 DIAGNOSIS — C7A00 Malignant carcinoid tumor of unspecified site: Secondary | ICD-10-CM

## 2017-01-03 LAB — VITAMIN B12: Vitamin B-12: 436 pg/mL (ref 180–914)

## 2017-01-03 LAB — FERRITIN: FERRITIN: 13 ng/mL — AB (ref 24–336)

## 2017-01-03 LAB — IRON AND TIBC
Iron: 57 ug/dL (ref 45–182)
Saturation Ratios: 20 % (ref 17.9–39.5)
TIBC: 284 ug/dL (ref 250–450)
UIBC: 227 ug/dL

## 2017-01-03 LAB — FOLATE: Folate: 18.5 ng/mL (ref >5.9)

## 2017-01-03 MED ORDER — OCTREOTIDE ACETATE 30 MG IM KIT
30.0000 mg | PACK | Freq: Once | INTRAMUSCULAR | Status: AC
  Administered 2017-01-03: 30 mg via INTRAMUSCULAR

## 2017-01-03 NOTE — Patient Instructions (Signed)
[place] at [facility] Discharge Instructions  RECOMMENDATIONS MADE BY THE CONSULTANT AND ANY TEST RESULTS WILL BE SENT TO YOUR REFERRING PHYSICIAN.  Sandostatin given Follow up as scheduled  Thank you for choosing [place] at [facility] to provide your oncology and hematology care.  To afford each patient quality time with our provider, please arrive at least 15 minutes before your scheduled appointment time.    If you have a lab appointment with the [facility] please come in thru the  Main Entrance and check in at the main information desk  You need to re-schedule your appointment should you arrive 10 or more minutes late.  We strive to give you quality time with our providers, and arriving late affects you and other patients whose appointments are after yours.  Also, if you no show three or more times for appointments you may be dismissed from the clinic at the providers discretion.     Again, thank you for choosing [facility].  Our hope is that these requests will decrease the amount of time that you wait before being seen by our physicians.       _____________________________________________________________  Should you have questions after your visit to [facility], please contact our office at [phone] between the hours of 8:30 a.m. and 4:30 p.m.  Voicemails left after 4:30 p.m. will not be returned until the following business day.  For prescription refill requests, have your pharmacy contact our office.       Resources For Cancer Patients and their Caregivers ? American Cancer Society: Can assist with transportation, wigs, general needs, runs Look Good Feel Better.        343-450-6777 ? Cancer Care: Provides financial assistance, online support groups, medication/co-pay assistance.  1-800-813-HOPE 212-718-2761) ? [place] Assists [place] cancer patients and their  families through emotional , educational and financial support.  [phone] ? [place] DSS Where to apply for food stamps, Medicaid and utility assistance. (678)001-8256 ? RCATS: Transportation to medical appointments. 954-680-2713 ? Social Security Administration: May apply for disability if have a Stage IV cancer. [phone] 661-050-8211 ? [employer], Disability and Transit Services: Assists with nutrition, care and transit needs. [place] Support Programs: @10RELATIVEDAYS @ > Cancer Support Group  2nd Tuesday of the month 1pm-2pm, Journey Room  > Creative Journey  3rd Tuesday of the month 1130am-1pm, Journey Room  > Look Good Feel Better  1st Wednesday of the month 10am-12 noon, Journey Room (Call [place] to register [phone])

## 2017-01-03 NOTE — Progress Notes (Signed)
[place], [place] [address]   CLINIC:  Medical Oncology/Hematology  PCP:  Allen Burly, MD [place] [id number] [phone]   REASON FOR VISIT:  Follow-up for malignant carcinoid tumor AND h/o H&N cancer, kidney cancer, and prostate cancer.   CURRENT THERAPY: Sandostatin injection every 28 days AND Observation    BRIEF ONCOLOGIC HISTORY:  (From Dr. Donald Davenport last note on 07/19/16)    HISTORY OF PRESENT ILLNESS:  (From Dr. Donald Davenport last note on 07/19/16)      INTERVAL HISTORY:  [name] 76 y.o. male presents for continued follow-up of carcinoid tumor on Sandostatin, as well as history of multiple malignancies.   Patient states that he feels well today. He states that his only complaint is that he's noted that his appetite has decreased some since the weather has turned so hot. He denies any associated weight loss. Otherwise he denies any flushing, diarrhea, chest pain, shortness breath, abdominal pain, recent infections.  REVIEW OF SYSTEMS:  Review of Systems  Constitutional: Positive for appetite change (slight decrease due to hot weather). Negative for chills, fatigue and fever.  HENT:  Negative.  Negative for lump/mass and nosebleeds.   Eyes: Negative.   Respiratory: Negative.  Negative for cough and shortness of breath.   Cardiovascular: Negative.  Negative for chest pain and leg swelling.  Gastrointestinal: Negative.  Negative for abdominal pain, blood in stool, constipation, diarrhea, nausea and vomiting.  Endocrine: Negative.   Genitourinary: Negative.  Negative for dysuria and hematuria.   Musculoskeletal: Negative.  Negative for arthralgias.  Skin: Negative.  Negative for rash.  Neurological: Negative.  Negative for dizziness and headaches.  Hematological: Negative.  Negative for adenopathy. Does not bruise/bleed easily.  Psychiatric/Behavioral: Negative.  Negative for depression and sleep  disturbance. The patient is not nervous/anxious.      PAST MEDICAL/SURGICAL HISTORY:  Past Medical History:  Diagnosis Date  . Anemia   . Cancer [facility])    right renal . Prostate- Radiation treatment  . CHF (congestive heart failure) ([place])   . Chronic kidney disease    stage 4  . Edema   . Heart murmur    "nothing to worry about"  . Hyperlipidemia   . Hypertension   . Malignant carcinoid tumor ([place]) 01/03/2016   Past Surgical History:  Procedure Laterality Date  . AV FISTULA PLACEMENT Left 10/20/2014   Procedure: Left Arm ARTERIOVENOUS (AV) FISTULA CREATION;  Surgeon: Allen Dutch, MD;  Location: [facility];  Service: Vascular;  Laterality: Left;  . NEPHRECTOMY Right 2010     SOCIAL HISTORY:  Social History   Social History  . Marital status: Married    Spouse name: N/A  . Number of children: N/A  . Years of education: N/A   Occupational History  . Not on file.   Social History Main Topics  . Smoking status: Never Smoker  . Smokeless tobacco: Never Used  . Alcohol use No  . Drug use: No  . Sexual activity: Not on file   Other Topics Concern  . Not on file   Social History Narrative  . No narrative on file    FAMILY HISTORY:  Family History  Problem Relation Age of Onset  . Cancer Mother   . Hypertension Father   . Cancer Sister     CURRENT MEDICATIONS:  Outpatient Encounter Prescriptions as of 01/03/2017  Medication Sig Note  . alendronate (FOSAMAX) 70 MG tablet TAKE ONE  TABLET BY MOUTH ONCE A WEEK IN THE MORNING WITH A FULL GLASS OF WATER ON AN EMPTY STOMACH. DO NOT LAY DOWN FOR 30 MINUTES. 07/19/2016: Received from: External Pharmacy  . atorvastatin (LIPITOR) 80 MG tablet Take 80 mg by mouth daily.   . calcitRIOL (ROCALTROL) 0.25 MCG capsule  04/26/2016: Received from: External Pharmacy  . cholecalciferol (VITAMIN D) 1000 UNITS tablet Take 1,000 Units by mouth daily.   . furosemide (LASIX) 40 MG tablet Take 40 mg by mouth.   . hydrALAZINE (APRESOLINE) 25  MG tablet TAKE THREE (3) TABLETS BY MOUTH TWICE DAILY 07/19/2016: Received from: External Pharmacy  . hydrALAZINE (APRESOLINE) 50 MG tablet Take 50 mg by mouth 2 (two) times daily.   [name] labetalol (NORMODYNE) 300 MG tablet Take 300 mg by mouth 2 (two) times daily.   . Omega-3 Fatty Acids (FISH OIL) 1000 MG CAPS Take 1 capsule by mouth daily.   . sodium bicarbonate 650 MG tablet Take 650 mg by mouth daily.   . tamsulosin (FLOMAX) 0.4 MG CAPS capsule  04/26/2016: Received from: External Pharmacy   Facility-Administered Encounter Medications as of 01/03/2017  Medication  . octreotide (SANDOSTATIN LAR) IM injection 30 mg    ALLERGIES:  No Known Allergies   PHYSICAL EXAM:  ECOG Performance status: 0 - Asymptomatic   Vitals:   01/03/17 1136  BP: 115/62  Pulse: 97  Resp: 16  Temp: 97.8 F (36.6 C)   Filed Weights   01/03/17 1136  Weight: 212 lb (96.2 kg)    Physical Exam  Constitutional: He is oriented to person, place, and time and well-developed, well-nourished, and in no distress.  HENT:  Head: Normocephalic.  Mouth/Throat: Oropharynx is clear and moist. No oropharyngeal exudate.  Eyes: Conjunctivae are normal. Pupils are equal, round, and reactive to light. No scleral icterus.  Neck: Normal range of motion. Neck supple.  Cardiovascular: Normal rate, regular rhythm and normal heart sounds.   Pulmonary/Chest: Effort normal and breath sounds normal. No respiratory distress.  Abdominal: Soft. Bowel sounds are normal. There is no tenderness. There is no rebound.  Musculoskeletal: Normal range of motion. He exhibits no edema.  Lymphadenopathy:    He has no cervical adenopathy.       Right: No supraclavicular adenopathy present.       Left: No supraclavicular adenopathy present.  Neurological: He is alert and oriented to person, place, and time. No cranial nerve deficit. Gait normal.  Skin: Skin is warm and dry. No rash noted.  Psychiatric: Mood, memory, affect and judgment normal.    Nursing note and vitals reviewed.    LABORATORY DATA:  I have reviewed the labs as listed.  CBC    Component Value Date/Time   WBC 3.5 (L) 10/11/2016 0957   RBC 3.69 (L) 10/11/2016 0957   HGB 11.1 (L) 10/11/2016 0957   HCT 33.5 (L) 10/11/2016 0957   PLT 119 (L) 10/11/2016 0957   MCV 90.8 10/11/2016 0957   MCH 30.1 10/11/2016 0957   MCHC 33.1 10/11/2016 0957   RDW 13.9 10/11/2016 0957   LYMPHSABS 0.5 (L) 10/11/2016 0957   MONOABS 0.3 10/11/2016 0957   EOSABS 0.1 10/11/2016 0957   BASOSABS 0.0 10/11/2016 0957   CMP Latest Ref Rng & Units 10/11/2016 07/19/2016 04/26/2016  Glucose 65 - 99 mg/dL 81 98 82  BUN 6 - 20 mg/dL 49(H) 54(H) 52(H)  Creatinine 0.61 - 1.24 mg/dL 3.84(H) 3.95(H) 4.15(H)  Sodium 135 - 145 mmol/L 136 136 138  Potassium 3.5 -  5.1 mmol/L 4.2 4.4 4.3  Chloride 101 - 111 mmol/L 104 103 107  CO2 22 - 32 mmol/L 26 26 26   Calcium 8.9 - 10.3 mg/dL 8.7(L) 9.0 8.5(L)  Total Protein 6.5 - 8.1 g/dL 6.1(L) 6.3(L) 6.6  Total Bilirubin 0.3 - 1.2 mg/dL 0.8 0.7 0.7  Alkaline Phos 38 - 126 U/L 55 53 47  AST 15 - 41 U/L 41 58(H) 48(H)  ALT 17 - 63 U/L 23 29 26     PENDING LABS:    DIAGNOSTIC IMAGING:  MRI abd: 12/01/14 [facility])       PATHOLOGY:     ASSESSMENT & PLAN:   Malignant carcinoid tumor:  -Reportedly 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  Previously managed at [facility] in [place], [place].  Mr. Chio transferred his care to [facility] in 04/2016.  -We have been monitoring chromogranin A and serum serotonin levels; he understands that these tests are not specific for just carcinoid tumors and can be elevated in other clinical scenarios. However, it can be helpful to monitor the trends over time. We will continue to monitor per Dr. Donald Davenport recommendations.  -Continue Sandostatin monthly.        Pancytopenia:  -Chronic cytopenias stable.  History of prostate  cancer: -Diagnosed in 2016; treated with radiation therapy with Dr. Tammi Klippel. -Maintain follow-up with urology.   History of kidney cancer: -Diagnosed in 2010; treated with right nephrectomy at [place] follow-up with nephrology.   History of H&N cancer: -Diagnosed in 2009; treated with surgery and radiation therapy.  -No evidence of recurrent disease on physical exam. We will continue to monitor.       Dispo:  -Continue monthly Sandostatin.  -Return to cancer center in 3 months with labs.    All questions were answered to patient's stated satisfaction. Encouraged patient to call with any new concerns or questions before his next visit to the cancer center and we can certain see him sooner, if needed.      Orders placed this encounter:  Orders Placed This Encounter  Procedures  . CBC with Differential  . Comprehensive metabolic panel  . Chromogranin A  . Serotonin serum     Twana First, MD

## 2017-01-03 NOTE — Progress Notes (Signed)
Papa A Mehan presents today for injection per MD orders. Sandostatin LAR 30mg  administered IM in the right upper buttocks. Administration without incident. Patient tolerated well. Vitals stable and discharged home from clinic ambulatory , follow up as scheduled.

## 2017-01-04 ENCOUNTER — Other Ambulatory Visit (HOSPITAL_COMMUNITY): Payer: Self-pay | Admitting: Adult Health

## 2017-01-10 ENCOUNTER — Encounter (HOSPITAL_BASED_OUTPATIENT_CLINIC_OR_DEPARTMENT_OTHER): Payer: Medicare Other

## 2017-01-10 ENCOUNTER — Encounter (HOSPITAL_COMMUNITY): Payer: Self-pay

## 2017-01-10 VITALS — BP 122/57 | HR 52 | Temp 97.4°F | Resp 18

## 2017-01-10 DIAGNOSIS — N184 Chronic kidney disease, stage 4 (severe): Secondary | ICD-10-CM

## 2017-01-10 DIAGNOSIS — C7A00 Malignant carcinoid tumor of unspecified site: Secondary | ICD-10-CM

## 2017-01-10 DIAGNOSIS — D509 Iron deficiency anemia, unspecified: Secondary | ICD-10-CM

## 2017-01-10 MED ORDER — SODIUM CHLORIDE 0.9 % IV SOLN
Freq: Once | INTRAVENOUS | Status: AC
  Administered 2017-01-10: 12:00:00 via INTRAVENOUS

## 2017-01-10 MED ORDER — SODIUM CHLORIDE 0.9 % IV SOLN
510.0000 mg | Freq: Once | INTRAVENOUS | Status: AC
  Administered 2017-01-10: 510 mg via INTRAVENOUS
  Filled 2017-01-10: qty 17

## 2017-01-10 NOTE — Patient Instructions (Signed)
[facility] at [facility] Discharge Instructions  RECOMMENDATIONS MADE BY THE CONSULTANT AND ANY TEST RESULTS WILL BE SENT TO YOUR REFERRING PHYSICIAN.  Iron infusion today. Return as scheduled.   Thank you for choosing [facility] at [facility] to provide your oncology and hematology care.  To afford each patient quality time with our provider, please arrive at least 15 minutes before your scheduled appointment time.    If you have a lab appointment with the Cancer Center please come in thru the  Main Entrance and check in at the main information desk  You need to re-schedule your appointment should you arrive 10 or more minutes late.  We strive to give you quality time with our providers, and arriving late affects you and other patients whose appointments are after yours.  Also, if you no show three or more times for appointments you may be dismissed from the clinic at the providers discretion.     Again, thank you for choosing [facility].  Our hope is that these requests will decrease the amount of time that you wait before being seen by our physicians.       _____________________________________________________________  Should you have questions after your visit to [facility], please contact our office at [phone] between the hours of 8:30 a.m. and 4:30 p.m.  Voicemails left after 4:30 p.m. will not be returned until the following business day.  For prescription refill requests, have your pharmacy contact our office.       Resources For Cancer Patients and their Caregivers ? American Cancer Society: Can assist with transportation, wigs, general needs, runs Look Good Feel Better.        1-888-227-6333 ? Cancer Care: Provides financial assistance, online support groups, medication/co-pay assistance.  1-800-813-HOPE (4673) ? [facility] Assists [place] cancer patients and their  families through emotional , educational and financial support.  336-427-4357 ? [place] DSS Where to apply for food stamps, Medicaid and utility assistance. 336-342-1394 ? RCATS: Transportation to medical appointments. 336-347-2287 ? Social Security Administration: May apply for disability if have a Stage IV cancer. 336-342-7796 1-800-772-1213 ? [place] Aging, Disability and Transit Services: Assists with nutrition, care and transit needs. 336-349-2343  Cancer Center Support Programs: @10RELATIVEDAYS@ > Cancer Support Group  2nd Tuesday of the month 1pm-2pm, Journey Room  > Creative Journey  3rd Tuesday of the month 1130am-1pm, Journey Room  > Look Good Feel Better  1st Wednesday of the month 10am-12 noon, Journey Room (Call [employer] Cancer [employer] to register 1-800-395-5775)   

## 2017-01-10 NOTE — Progress Notes (Signed)
Tolerated infusion w/o adverse reaction.  Alert, in no distress.  VSS.  Discharged ambulatory.  

## 2017-01-16 DIAGNOSIS — I10 Essential (primary) hypertension: Secondary | ICD-10-CM | POA: Diagnosis not present

## 2017-01-16 DIAGNOSIS — M818 Other osteoporosis without current pathological fracture: Secondary | ICD-10-CM | POA: Diagnosis not present

## 2017-01-16 DIAGNOSIS — E784 Other hyperlipidemia: Secondary | ICD-10-CM | POA: Diagnosis not present

## 2017-01-16 DIAGNOSIS — N184 Chronic kidney disease, stage 4 (severe): Secondary | ICD-10-CM | POA: Diagnosis not present

## 2017-01-18 ENCOUNTER — Encounter (HOSPITAL_COMMUNITY): Payer: Self-pay

## 2017-01-18 ENCOUNTER — Encounter (HOSPITAL_COMMUNITY): Payer: Medicare Other | Attending: Hematology & Oncology

## 2017-01-18 VITALS — BP 124/61 | HR 46 | Temp 97.6°F | Resp 18

## 2017-01-18 DIAGNOSIS — D509 Iron deficiency anemia, unspecified: Secondary | ICD-10-CM

## 2017-01-18 DIAGNOSIS — C7A00 Malignant carcinoid tumor of unspecified site: Secondary | ICD-10-CM

## 2017-01-18 MED ORDER — SODIUM CHLORIDE 0.9% FLUSH
10.0000 mL | INTRAVENOUS | Status: DC | PRN
  Administered 2017-01-18: 10 mL
  Filled 2017-01-18: qty 10

## 2017-01-18 MED ORDER — SODIUM CHLORIDE 0.9 % IV SOLN
510.0000 mg | Freq: Once | INTRAVENOUS | Status: AC
  Administered 2017-01-18: 510 mg via INTRAVENOUS
  Filled 2017-01-18: qty 17

## 2017-01-18 MED ORDER — SODIUM CHLORIDE 0.9 % IV SOLN
Freq: Once | INTRAVENOUS | Status: AC
  Administered 2017-01-18: 14:00:00 via INTRAVENOUS

## 2017-01-18 NOTE — Progress Notes (Signed)
Patient tolerated infusion without incidence. Patient discharged ambulatory and in stable condition from clinic to self. Patient to follow up as scheduled.

## 2017-01-18 NOTE — Patient Instructions (Signed)
[place] at [facility] Discharge Instructions  RECOMMENDATIONS MADE BY THE CONSULTANT AND ANY TEST RESULTS WILL BE SENT TO YOUR REFERRING PHYSICIAN.  You received feraheme 510 mg today. Follow up as scheduled.  Thank you for choosing [place] at [facility] to provide your oncology and hematology care.  To afford each patient quality time with our provider, please arrive at least 15 minutes before your scheduled appointment time.    If you have a lab appointment with the [facility] please come in thru the  Main Entrance and check in at the main information desk  You need to re-schedule your appointment should you arrive 10 or more minutes late.  We strive to give you quality time with our providers, and arriving late affects you and other patients whose appointments are after yours.  Also, if you no show three or more times for appointments you may be dismissed from the clinic at the providers discretion.     Again, thank you for choosing [facility].  Our hope is that these requests will decrease the amount of time that you wait before being seen by our physicians.       _____________________________________________________________  Should you have questions after your visit to [facility], please contact our office at [phone] between the hours of 8:30 a.m. and 4:30 p.m.  Voicemails left after 4:30 p.m. will not be returned until the following business day.  For prescription refill requests, have your pharmacy contact our office.       Resources For Cancer Patients and their Caregivers ? American Cancer Society: Can assist with transportation, wigs, general needs, runs Look Good Feel Better.        3162808113 ? Cancer Care: Provides financial assistance, online support groups, medication/co-pay assistance.  1-800-813-HOPE (570)525-3483) ? [facility] Assists [place] cancer  patients and their families through emotional , educational and financial support.  (918)284-2453 ? [place] DSS Where to apply for food stamps, Medicaid and utility assistance. 9308011097 ? RCATS: Transportation to medical appointments. 587-779-9132 ? Social Security Administration: May apply for disability if have a Stage IV cancer. 319-391-5714 419-636-4749 ? [employer], Disability and Transit Services: Assists with nutrition, care and transit needs. [place] Support Programs: @10RELATIVEDAYS @ > Cancer Support Group  2nd Tuesday of the month 1pm-2pm, Journey Room  > Creative Journey  3rd Tuesday of the month 1130am-1pm, Journey Room  > Look Good Feel Better  1st Wednesday of the month 10am-12 noon, Journey Room (Call [place] to register [phone])

## 2017-01-31 ENCOUNTER — Encounter (HOSPITAL_BASED_OUTPATIENT_CLINIC_OR_DEPARTMENT_OTHER): Payer: Medicare Other

## 2017-01-31 ENCOUNTER — Encounter (HOSPITAL_COMMUNITY): Payer: Self-pay

## 2017-01-31 VITALS — BP 118/69 | HR 70 | Temp 98.0°F | Resp 18

## 2017-01-31 DIAGNOSIS — E1151 Type 2 diabetes mellitus with diabetic peripheral angiopathy without gangrene: Secondary | ICD-10-CM | POA: Diagnosis not present

## 2017-01-31 DIAGNOSIS — C7B02 Secondary carcinoid tumors of liver: Secondary | ICD-10-CM

## 2017-01-31 DIAGNOSIS — C7A00 Malignant carcinoid tumor of unspecified site: Secondary | ICD-10-CM

## 2017-01-31 DIAGNOSIS — E1140 Type 2 diabetes mellitus with diabetic neuropathy, unspecified: Secondary | ICD-10-CM | POA: Diagnosis not present

## 2017-01-31 MED ORDER — OCTREOTIDE ACETATE 30 MG IM KIT
30.0000 mg | PACK | Freq: Once | INTRAMUSCULAR | Status: AC
  Administered 2017-01-31: 30 mg via INTRAMUSCULAR

## 2017-01-31 NOTE — Progress Notes (Signed)
Allen Davenport presents today for injection per MD orders. Sandostatin LAR 30 administered SQ in left upper buttocks. Administration without incident. Patient tolerated well. Vitals stable and discharged home from clinic ambulatory. Follow up as scheduled.

## 2017-02-01 DIAGNOSIS — D631 Anemia in chronic kidney disease: Secondary | ICD-10-CM | POA: Diagnosis not present

## 2017-02-01 DIAGNOSIS — Z79899 Other long term (current) drug therapy: Secondary | ICD-10-CM | POA: Diagnosis not present

## 2017-02-01 DIAGNOSIS — E559 Vitamin D deficiency, unspecified: Secondary | ICD-10-CM | POA: Diagnosis not present

## 2017-02-01 DIAGNOSIS — N184 Chronic kidney disease, stage 4 (severe): Secondary | ICD-10-CM | POA: Diagnosis not present

## 2017-02-01 DIAGNOSIS — R809 Proteinuria, unspecified: Secondary | ICD-10-CM | POA: Diagnosis not present

## 2017-02-01 DIAGNOSIS — D509 Iron deficiency anemia, unspecified: Secondary | ICD-10-CM | POA: Diagnosis not present

## 2017-02-13 DIAGNOSIS — N184 Chronic kidney disease, stage 4 (severe): Secondary | ICD-10-CM | POA: Diagnosis not present

## 2017-02-13 DIAGNOSIS — D638 Anemia in other chronic diseases classified elsewhere: Secondary | ICD-10-CM | POA: Diagnosis not present

## 2017-02-13 DIAGNOSIS — I10 Essential (primary) hypertension: Secondary | ICD-10-CM | POA: Diagnosis not present

## 2017-02-13 DIAGNOSIS — I509 Heart failure, unspecified: Secondary | ICD-10-CM | POA: Diagnosis not present

## 2017-02-13 DIAGNOSIS — R809 Proteinuria, unspecified: Secondary | ICD-10-CM | POA: Diagnosis not present

## 2017-02-13 DIAGNOSIS — E872 Acidosis: Secondary | ICD-10-CM | POA: Diagnosis not present

## 2017-02-13 DIAGNOSIS — E559 Vitamin D deficiency, unspecified: Secondary | ICD-10-CM | POA: Diagnosis not present

## 2017-02-27 DIAGNOSIS — I10 Essential (primary) hypertension: Secondary | ICD-10-CM | POA: Diagnosis not present

## 2017-02-27 DIAGNOSIS — N183 Chronic kidney disease, stage 3 (moderate): Secondary | ICD-10-CM | POA: Diagnosis not present

## 2017-02-27 DIAGNOSIS — M818 Other osteoporosis without current pathological fracture: Secondary | ICD-10-CM | POA: Diagnosis not present

## 2017-02-28 ENCOUNTER — Encounter (HOSPITAL_COMMUNITY): Payer: Self-pay

## 2017-02-28 ENCOUNTER — Encounter (HOSPITAL_COMMUNITY): Payer: Medicare Other | Attending: Hematology & Oncology

## 2017-02-28 VITALS — BP 110/60 | HR 58 | Temp 97.6°F | Resp 18

## 2017-02-28 DIAGNOSIS — C7B02 Secondary carcinoid tumors of liver: Secondary | ICD-10-CM

## 2017-02-28 DIAGNOSIS — C7A00 Malignant carcinoid tumor of unspecified site: Secondary | ICD-10-CM

## 2017-02-28 MED ORDER — OCTREOTIDE ACETATE 30 MG IM KIT
30.0000 mg | PACK | Freq: Once | INTRAMUSCULAR | Status: AC
  Administered 2017-02-28: 30 mg via INTRAMUSCULAR

## 2017-02-28 NOTE — Progress Notes (Signed)
Allen Davenport presents today for injection per the provider's orders.  Sandostatin administration without incident; see MAR for injection details.  Patient tolerated procedure well and without incident.  No questions or complaints noted at this time.  Discharged ambulatory.  

## 2017-03-01 DIAGNOSIS — D631 Anemia in chronic kidney disease: Secondary | ICD-10-CM | POA: Diagnosis not present

## 2017-03-01 DIAGNOSIS — Z79899 Other long term (current) drug therapy: Secondary | ICD-10-CM | POA: Diagnosis not present

## 2017-03-01 DIAGNOSIS — N184 Chronic kidney disease, stage 4 (severe): Secondary | ICD-10-CM | POA: Diagnosis not present

## 2017-03-20 DIAGNOSIS — I10 Essential (primary) hypertension: Secondary | ICD-10-CM | POA: Diagnosis not present

## 2017-03-20 DIAGNOSIS — N183 Chronic kidney disease, stage 3 (moderate): Secondary | ICD-10-CM | POA: Diagnosis not present

## 2017-03-20 DIAGNOSIS — M818 Other osteoporosis without current pathological fracture: Secondary | ICD-10-CM | POA: Diagnosis not present

## 2017-03-28 ENCOUNTER — Encounter (HOSPITAL_COMMUNITY): Payer: Self-pay | Admitting: Oncology

## 2017-03-28 ENCOUNTER — Encounter (HOSPITAL_BASED_OUTPATIENT_CLINIC_OR_DEPARTMENT_OTHER): Payer: Medicare Other

## 2017-03-28 ENCOUNTER — Encounter (HOSPITAL_COMMUNITY): Payer: Medicare Other

## 2017-03-28 ENCOUNTER — Encounter (HOSPITAL_COMMUNITY): Payer: Medicare Other | Attending: Oncology | Admitting: Oncology

## 2017-03-28 VITALS — BP 119/74 | HR 58 | Temp 98.5°F | Resp 18 | Wt 213.2 lb

## 2017-03-28 DIAGNOSIS — N189 Chronic kidney disease, unspecified: Secondary | ICD-10-CM | POA: Diagnosis not present

## 2017-03-28 DIAGNOSIS — D631 Anemia in chronic kidney disease: Secondary | ICD-10-CM | POA: Diagnosis not present

## 2017-03-28 DIAGNOSIS — Z85528 Personal history of other malignant neoplasm of kidney: Secondary | ICD-10-CM | POA: Diagnosis not present

## 2017-03-28 DIAGNOSIS — D508 Other iron deficiency anemias: Secondary | ICD-10-CM

## 2017-03-28 DIAGNOSIS — C7B02 Secondary carcinoid tumors of liver: Secondary | ICD-10-CM

## 2017-03-28 DIAGNOSIS — C7A00 Malignant carcinoid tumor of unspecified site: Secondary | ICD-10-CM

## 2017-03-28 DIAGNOSIS — D61818 Other pancytopenia: Secondary | ICD-10-CM

## 2017-03-28 DIAGNOSIS — Z8546 Personal history of malignant neoplasm of prostate: Secondary | ICD-10-CM | POA: Diagnosis not present

## 2017-03-28 DIAGNOSIS — Z8589 Personal history of malignant neoplasm of other organs and systems: Secondary | ICD-10-CM

## 2017-03-28 DIAGNOSIS — E611 Iron deficiency: Secondary | ICD-10-CM | POA: Diagnosis not present

## 2017-03-28 LAB — CBC WITH DIFFERENTIAL/PLATELET
Basophils Absolute: 0 10*3/uL (ref 0.0–0.1)
Basophils Relative: 0 %
EOS ABS: 0.2 10*3/uL (ref 0.0–0.7)
Eosinophils Relative: 5 %
HEMATOCRIT: 35.1 % — AB (ref 39.0–52.0)
HEMOGLOBIN: 11.9 g/dL — AB (ref 13.0–17.0)
LYMPHS ABS: 0.5 10*3/uL — AB (ref 0.7–4.0)
Lymphocytes Relative: 10 %
MCH: 32.1 pg (ref 26.0–34.0)
MCHC: 33.9 g/dL (ref 30.0–36.0)
MCV: 94.6 fL (ref 78.0–100.0)
MONOS PCT: 11 %
Monocytes Absolute: 0.5 10*3/uL (ref 0.1–1.0)
NEUTROS PCT: 73 %
Neutro Abs: 3.5 10*3/uL (ref 1.7–7.7)
Platelets: 102 10*3/uL — ABNORMAL LOW (ref 150–400)
RBC: 3.71 MIL/uL — ABNORMAL LOW (ref 4.22–5.81)
RDW: 13.7 % (ref 11.5–15.5)
WBC: 4.8 10*3/uL (ref 4.0–10.5)

## 2017-03-28 LAB — COMPREHENSIVE METABOLIC PANEL
ALK PHOS: 53 U/L (ref 38–126)
ALT: 26 U/L (ref 17–63)
ANION GAP: 7 (ref 5–15)
AST: 52 U/L — ABNORMAL HIGH (ref 15–41)
Albumin: 3.6 g/dL (ref 3.5–5.0)
BILIRUBIN TOTAL: 0.7 mg/dL (ref 0.3–1.2)
BUN: 46 mg/dL — ABNORMAL HIGH (ref 6–20)
CALCIUM: 8.9 mg/dL (ref 8.9–10.3)
CO2: 26 mmol/L (ref 22–32)
CREATININE: 3.75 mg/dL — AB (ref 0.61–1.24)
Chloride: 104 mmol/L (ref 101–111)
GFR calc Af Amer: 17 mL/min — ABNORMAL LOW (ref >60)
GFR, EST NON AFRICAN AMERICAN: 14 mL/min — AB (ref >60)
Glucose, Bld: 107 mg/dL — ABNORMAL HIGH (ref 65–99)
Potassium: 4.3 mmol/L (ref 3.5–5.1)
Sodium: 137 mmol/L (ref 135–145)
TOTAL PROTEIN: 6.2 g/dL — AB (ref 6.5–8.1)

## 2017-03-28 MED ORDER — OCTREOTIDE ACETATE 30 MG IM KIT
30.0000 mg | PACK | Freq: Once | INTRAMUSCULAR | Status: AC
  Administered 2017-03-28: 30 mg via INTRAMUSCULAR

## 2017-03-28 NOTE — Patient Instructions (Signed)
[place] at [facility] Discharge Instructions  RECOMMENDATIONS MADE BY THE CONSULTANT AND ANY TEST RESULTS WILL BE SENT TO YOUR REFERRING PHYSICIAN.  You were seen today by Dr. Twana First You will get your Sandostatin injection today, continue to get it monthly. Follow up in 3 months with lab work   Thank you for choosing [place] at [facility] to provide your oncology and hematology care.  To afford each patient quality time with our provider, please arrive at least 15 minutes before your scheduled appointment time.    If you have a lab appointment with the [facility] please come in thru the  Main Entrance and check in at the main information desk  You need to re-schedule your appointment should you arrive 10 or more minutes late.  We strive to give you quality time with our providers, and arriving late affects you and other patients whose appointments are after yours.  Also, if you no show three or more times for appointments you may be dismissed from the clinic at the providers discretion.     Again, thank you for choosing [facility].  Our hope is that these requests will decrease the amount of time that you wait before being seen by our physicians.       _____________________________________________________________  Should you have questions after your visit to [facility], please contact our office at [phone] between the hours of 8:30 a.m. and 4:30 p.m.  Voicemails left after 4:30 p.m. will not be returned until the following business day.  For prescription refill requests, have your pharmacy contact our office.       Resources For Cancer Patients and their Caregivers ? American Cancer Society: Can assist with transportation, wigs, general needs, runs Look Good Feel Better.        9401885601 ? Cancer Care: Provides financial assistance, online support groups, medication/co-pay assistance.   1-800-813-HOPE (916)460-0620) ? [place] Assists [place] cancer patients and their families through emotional , educational and financial support.  856 434 6454 ? [place] DSS Where to apply for food stamps, Medicaid and utility assistance. (757)716-6027 ? RCATS: Transportation to medical appointments. (250)222-6815 ? Social Security Administration: May apply for disability if have a Stage IV cancer. 4306319029 (856)560-0613 ? [employer], Disability and Transit Services: Assists with nutrition, care and transit needs. [place] Support Programs: @10RELATIVEDAYS @ > Cancer Support Group  2nd Tuesday of the month 1pm-2pm, Journey Room  > Creative Journey  3rd Tuesday of the month 1130am-1pm, Journey Room  > Look Good Feel Better  1st Wednesday of the month 10am-12 noon, Journey Room (Call [place] to register [phone])

## 2017-03-28 NOTE — Patient Instructions (Signed)
[facility] Discharge Instructions for Patients Receiving Chemotherapy  Today you received the following chemotherapy agents sandostatin shot today.   To help prevent nausea and vomiting after your treatment, we encourage you to take your nausea medication. If you develop nausea and vomiting that is not controlled by your nausea medication, call the clinic.   BELOW ARE SYMPTOMS THAT SHOULD BE REPORTED IMMEDIATELY:  *FEVER GREATER THAN 100.5 F  *CHILLS WITH OR WITHOUT FEVER  NAUSEA AND VOMITING THAT IS NOT CONTROLLED WITH YOUR NAUSEA MEDICATION  *UNUSUAL SHORTNESS OF BREATH  *UNUSUAL BRUISING OR BLEEDING  TENDERNESS IN MOUTH AND THROAT WITH OR WITHOUT PRESENCE OF ULCERS  *URINARY PROBLEMS  *BOWEL PROBLEMS  UNUSUAL RASH Items with * indicate a potential emergency and should be followed up as soon as possible.  Feel free to call the clinic you have any questions or concerns. The clinic phone number is [phone].  Please show the [place] at check-in to the Emergency Department and triage nurse.

## 2017-03-28 NOTE — Progress Notes (Signed)
Allen Davenport, [place] [address]   CLINIC:  Medical Oncology/Hematology  PCP:  [name], MD [place] [phone]   REASON FOR VISIT:  Follow-up for malignant carcinoid tumor AND h/o H&N cancer, kidney cancer, and prostate cancer.   CURRENT THERAPY: Sandostatin injection every 28 days AND Observation    BRIEF ONCOLOGIC HISTORY:  (From Dr. Donald Pore last note on 07/19/16)    HISTORY OF PRESENT ILLNESS:  (From Dr. Donald Pore last note on 07/19/16)      INTERVAL HISTORY:  [name] 76 y.o. male presents for continued follow-up of carcinoid tumor on Sandostatin, as well as history of multiple malignancies.   Patient presents today for continued follow-up for his carcinoid syndrome. He's also found to have iron deficiency with a ferritin of 13 back in June and received 2 doses of Feraheme on 01/10/2017 and 01/18/2017. Today his hemoglobin has improved up to 11.9 from 11.1 g/dL. He has been getting his monthly Sandostatin LAR and has been tolerating it well. His last dose was on 02/28/2017. He states that his energy level is at 75% right now. He states he did feel better after receiving 2 doses of Feraheme. He denies any flushing, diarrhea, nausea, vomiting, abdominal pain, chest pain, shortness breath, focal weakness. Still very active and does all his own ADLs  REVIEW OF SYSTEMS:  Review of Systems  Constitutional: Negative for appetite change, chills, fatigue and fever.  HENT:  Negative.  Negative for lump/mass and nosebleeds.   Eyes: Negative.   Respiratory: Negative.  Negative for cough and shortness of breath.   Cardiovascular: Negative.  Negative for chest pain and leg swelling.  Gastrointestinal: Negative.  Negative for abdominal pain, blood in stool, constipation, diarrhea, nausea and vomiting.  Endocrine: Negative.   Genitourinary: Negative.  Negative for dysuria and hematuria.   Musculoskeletal:  Negative.  Negative for arthralgias.  Skin: Negative.  Negative for rash.  Neurological: Negative.  Negative for dizziness and headaches.  Hematological: Negative.  Negative for adenopathy. Does not bruise/bleed easily.  Psychiatric/Behavioral: Negative.  Negative for depression and sleep disturbance. The patient is not nervous/anxious.      PAST MEDICAL/SURGICAL HISTORY:  Past Medical History:  Diagnosis Date  . Anemia   . Cancer [facility])    right renal . Prostate- Radiation treatment  . CHF (congestive heart failure) ([place])   . Chronic kidney disease    stage 4  . Edema   . Heart murmur    "nothing to worry about"  . Hyperlipidemia   . Hypertension   . Malignant carcinoid tumor ([place]) 01/03/2016   Past Surgical History:  Procedure Laterality Date  . AV FISTULA PLACEMENT Left 10/20/2014   Procedure: Left Arm ARTERIOVENOUS (AV) FISTULA CREATION;  Surgeon: Elam Dutch, MD;  Location: [facility];  Service: Vascular;  Laterality: Left;  . NEPHRECTOMY Right 2010     SOCIAL HISTORY:  Social History   Social History  . Marital status: Married    Spouse name: N/A  . Number of children: N/A  . Years of education: N/A   Occupational History  . Not on file.   Social History Main Topics  . Smoking status: Never Smoker  . Smokeless tobacco: Never Used  . Alcohol use No  . Drug use: No  . Sexual activity: Not on file   Other Topics Concern  . Not on file   Social History Narrative  . No narrative on  file    FAMILY HISTORY:  Family History  Problem Relation Age of Onset  . Cancer Mother   . Hypertension Father   . Cancer Sister     CURRENT MEDICATIONS:  Outpatient Encounter Prescriptions as of 03/28/2017  Medication Sig Note  . alendronate (FOSAMAX) 70 MG tablet TAKE ONE TABLET BY MOUTH ONCE A WEEK IN THE MORNING WITH A FULL GLASS OF WATER ON AN EMPTY STOMACH. DO NOT LAY DOWN FOR 30 MINUTES. 07/19/2016: Received from: External Pharmacy  . atorvastatin (LIPITOR) 80 MG  tablet Take 80 mg by mouth daily.   . calcitRIOL (ROCALTROL) 0.25 MCG capsule  04/26/2016: Received from: External Pharmacy  . cholecalciferol (VITAMIN D) 1000 UNITS tablet Take 1,000 Units by mouth daily.   [name] FERREX 150 150 MG capsule Take 150 mg by mouth 2 (two) times daily.   . furosemide (LASIX) 40 MG tablet Take 40 mg by mouth.   . hydrALAZINE (APRESOLINE) 25 MG tablet TAKE THREE (3) TABLETS BY MOUTH TWICE DAILY 07/19/2016: Received from: External Pharmacy  . hydrALAZINE (APRESOLINE) 50 MG tablet Take 50 mg by mouth 2 (two) times daily.   [name] labetalol (NORMODYNE) 300 MG tablet Take 300 mg by mouth 2 (two) times daily.   . Omega-3 Fatty Acids (FISH OIL) 1000 MG CAPS Take 1 capsule by mouth daily.   . sodium bicarbonate 650 MG tablet Take 650 mg by mouth daily.   . tamsulosin (FLOMAX) 0.4 MG CAPS capsule  04/26/2016: Received from: External Pharmacy   Facility-Administered Encounter Medications as of 03/28/2017  Medication  . octreotide (SANDOSTATIN LAR) IM injection 30 mg    ALLERGIES:  No Known Allergies   PHYSICAL EXAM:  ECOG Performance status: 0 - Asymptomatic   Vitals:   03/28/17 1133  BP: 119/74  Pulse: (!) 58  Resp: 18  Temp: 98.5 F (36.9 C)  SpO2: 100%   Filed Weights   03/28/17 1133  Weight: 213 lb 3.2 oz (96.7 kg)    Physical Exam  Constitutional: He is oriented to person, place, and time and well-developed, well-nourished, and in no distress.  HENT:  Head: Normocephalic.  Mouth/Throat: Oropharynx is clear and moist. No oropharyngeal exudate.  Eyes: Pupils are equal, round, and reactive to light. Conjunctivae are normal. No scleral icterus.  Neck: Normal range of motion. Neck supple.  Cardiovascular: Normal rate, regular rhythm and normal heart sounds.   Pulmonary/Chest: Effort normal and breath sounds normal. No respiratory distress.  Abdominal: Soft. Bowel sounds are normal. There is no tenderness. There is no rebound.  Musculoskeletal: Normal range of  motion. He exhibits no edema.  Lymphadenopathy:    He has no cervical adenopathy.       Right: No supraclavicular adenopathy present.       Left: No supraclavicular adenopathy present.  Neurological: He is alert and oriented to person, place, and time. No cranial nerve deficit. Gait normal.  Skin: Skin is warm and dry. No rash noted.  Psychiatric: Mood, memory, affect and judgment normal.  Nursing note and vitals reviewed.    LABORATORY DATA:  I have reviewed the labs as listed.  CBC    Component Value Date/Time   WBC 4.8 03/28/2017 1051   RBC 3.71 (L) 03/28/2017 1051   HGB 11.9 (L) 03/28/2017 1051   HCT 35.1 (L) 03/28/2017 1051   PLT PENDING 03/28/2017 1051   MCV 94.6 03/28/2017 1051   MCH 32.1 03/28/2017 1051   MCHC 33.9 03/28/2017 1051   RDW 13.7 03/28/2017 1051  LYMPHSABS 0.5 (L) 03/28/2017 1051   MONOABS 0.5 03/28/2017 1051   EOSABS 0.2 03/28/2017 1051   BASOSABS 0.0 03/28/2017 1051   CMP Latest Ref Rng & Units 03/28/2017 10/11/2016 07/19/2016  Glucose 65 - 99 mg/dL 107(H) 81 98  BUN 6 - 20 mg/dL 46(H) 49(H) 54(H)  Creatinine 0.61 - 1.24 mg/dL 3.75(H) 3.84(H) 3.95(H)  Sodium 135 - 145 mmol/L 137 136 136  Potassium 3.5 - 5.1 mmol/L 4.3 4.2 4.4  Chloride 101 - 111 mmol/L 104 104 103  CO2 22 - 32 mmol/L 26 26 26   Calcium 8.9 - 10.3 mg/dL 8.9 8.7(L) 9.0  Total Protein 6.5 - 8.1 g/dL 6.2(L) 6.1(L) 6.3(L)  Total Bilirubin 0.3 - 1.2 mg/dL 0.7 0.8 0.7  Alkaline Phos 38 - 126 U/L 53 55 53  AST 15 - 41 U/L 52(H) 41 58(H)  ALT 17 - 63 U/L 26 23 29     PENDING LABS:    DIAGNOSTIC IMAGING:  MRI abd: 12/01/14 [facility])       PATHOLOGY:     ASSESSMENT & PLAN:   Malignant carcinoid tumor:  -Reportedly 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  Previously managed at [facility] in [place], [place].  Mr. Pinkerton transferred his care to [facility] in 04/2016.  -We have been monitoring chromogranin A  and serum serotonin levels; he understands that these tests are not specific for just carcinoid tumors and can be elevated in other clinical scenarios. However, it can be helpful to monitor the trends over time.  -He had a mild increase in chromogranin A and serotonin levels back in march, we will follow his levels today. Patient is asymptomatic from his carcinoid disease. -Continue Sandostatin monthly, he has been tolerating it well.       Anemia in the setting of CKD and iron deficiency -s/p 2 dose of Feraheme on 01/10/2017 and 01/18/2017. -Will check his iron studies on the next visit.  Pancytopenia:  -Chronic cytopenias stable.  History of prostate cancer: -Diagnosed in 2016; treated with radiation therapy with Dr. Tammi Klippel. -Maintain follow-up with urology.   History of kidney cancer: -Diagnosed in 2010; treated with right nephrectomy at [facility] follow-up with nephrology.   History of H&N cancer: -Diagnosed in 2009; treated with surgery and radiation therapy.  -No evidence of recurrent disease on physical exam. We will continue to monitor.       Dispo:  -Continue monthly Sandostatin.  -Return to cancer center in 3 months with labs.   Orders Placed This Encounter  Procedures  . CBC with Differential    Standing Status:   Future    Standing Expiration Date:   03/28/2018  . Comprehensive metabolic panel    Standing Status:   Future    Standing Expiration Date:   03/28/2018  . Serotonin serum    Standing Status:   Future    Standing Expiration Date:   03/28/2018  . Chromogranin A    Standing Status:   Future    Standing Expiration Date:   03/28/2018  . Iron and TIBC    Standing Status:   Future    Standing Expiration Date:   03/28/2018  . Ferritin    Standing Status:   Future    Standing Expiration Date:   03/28/2018    All questions were answered to patient's stated satisfaction. Encouraged patient to call with any new concerns or questions before  his next  visit to the cancer center and we can certain see him sooner, if needed.    Twana First, MD

## 2017-03-28 NOTE — Progress Notes (Signed)
Patient tolerated sandostatin shot with no complaints voiced.  Site clean and dry with no bruising or swelling noted at site.  Band aid applied.  VSS and discharged in stable condition and ambulatory.

## 2017-03-29 DIAGNOSIS — N184 Chronic kidney disease, stage 4 (severe): Secondary | ICD-10-CM | POA: Diagnosis not present

## 2017-03-29 DIAGNOSIS — D631 Anemia in chronic kidney disease: Secondary | ICD-10-CM | POA: Diagnosis not present

## 2017-03-29 LAB — CHROMOGRANIN A: Chromogranin A: 18 nmol/L — ABNORMAL HIGH (ref 0–5)

## 2017-03-30 LAB — SEROTONIN SERUM: Serotonin, Serum: 463 ng/mL — ABNORMAL HIGH (ref 21–321)

## 2017-04-19 DIAGNOSIS — M810 Age-related osteoporosis without current pathological fracture: Secondary | ICD-10-CM | POA: Diagnosis not present

## 2017-04-19 DIAGNOSIS — M85852 Other specified disorders of bone density and structure, left thigh: Secondary | ICD-10-CM | POA: Diagnosis not present

## 2017-04-25 DIAGNOSIS — E1140 Type 2 diabetes mellitus with diabetic neuropathy, unspecified: Secondary | ICD-10-CM | POA: Diagnosis not present

## 2017-04-25 DIAGNOSIS — E1151 Type 2 diabetes mellitus with diabetic peripheral angiopathy without gangrene: Secondary | ICD-10-CM | POA: Diagnosis not present

## 2017-04-26 DIAGNOSIS — N184 Chronic kidney disease, stage 4 (severe): Secondary | ICD-10-CM | POA: Diagnosis not present

## 2017-04-26 DIAGNOSIS — D631 Anemia in chronic kidney disease: Secondary | ICD-10-CM | POA: Diagnosis not present

## 2017-04-27 ENCOUNTER — Encounter (HOSPITAL_COMMUNITY): Payer: Medicare Other | Attending: Hematology & Oncology

## 2017-04-27 ENCOUNTER — Encounter (HOSPITAL_COMMUNITY): Payer: Self-pay

## 2017-04-27 VITALS — BP 117/71 | HR 57 | Temp 97.7°F | Resp 18

## 2017-04-27 DIAGNOSIS — C7A00 Malignant carcinoid tumor of unspecified site: Secondary | ICD-10-CM | POA: Diagnosis present

## 2017-04-27 DIAGNOSIS — C7B02 Secondary carcinoid tumors of liver: Secondary | ICD-10-CM

## 2017-04-27 MED ORDER — OCTREOTIDE ACETATE 30 MG IM KIT
30.0000 mg | PACK | Freq: Once | INTRAMUSCULAR | Status: AC
  Administered 2017-04-27: 30 mg via INTRAMUSCULAR

## 2017-04-27 NOTE — Progress Notes (Signed)
Allen Davenport presents today for injection per the provider's orders.  Sandostatin administration without incident; see MAR for injection details.  Patient tolerated procedure well and without incident.  No questions or complaints noted at this time.  Discharged ambulatory.  

## 2017-04-30 DIAGNOSIS — I10 Essential (primary) hypertension: Secondary | ICD-10-CM | POA: Diagnosis not present

## 2017-04-30 DIAGNOSIS — M818 Other osteoporosis without current pathological fracture: Secondary | ICD-10-CM | POA: Diagnosis not present

## 2017-04-30 DIAGNOSIS — E7849 Other hyperlipidemia: Secondary | ICD-10-CM | POA: Diagnosis not present

## 2017-04-30 DIAGNOSIS — N184 Chronic kidney disease, stage 4 (severe): Secondary | ICD-10-CM | POA: Diagnosis not present

## 2017-05-14 DIAGNOSIS — E7849 Other hyperlipidemia: Secondary | ICD-10-CM | POA: Diagnosis not present

## 2017-05-14 DIAGNOSIS — I10 Essential (primary) hypertension: Secondary | ICD-10-CM | POA: Diagnosis not present

## 2017-05-24 DIAGNOSIS — R809 Proteinuria, unspecified: Secondary | ICD-10-CM | POA: Diagnosis not present

## 2017-05-24 DIAGNOSIS — Z79899 Other long term (current) drug therapy: Secondary | ICD-10-CM | POA: Diagnosis not present

## 2017-05-24 DIAGNOSIS — E559 Vitamin D deficiency, unspecified: Secondary | ICD-10-CM | POA: Diagnosis not present

## 2017-05-24 DIAGNOSIS — N184 Chronic kidney disease, stage 4 (severe): Secondary | ICD-10-CM | POA: Diagnosis not present

## 2017-05-24 DIAGNOSIS — D631 Anemia in chronic kidney disease: Secondary | ICD-10-CM | POA: Diagnosis not present

## 2017-05-28 ENCOUNTER — Encounter (HOSPITAL_COMMUNITY): Payer: Self-pay

## 2017-05-28 ENCOUNTER — Encounter (HOSPITAL_COMMUNITY): Payer: Medicare Other | Attending: Hematology & Oncology

## 2017-05-28 VITALS — BP 135/72 | HR 55 | Temp 98.6°F | Resp 16

## 2017-05-28 DIAGNOSIS — Z23 Encounter for immunization: Secondary | ICD-10-CM

## 2017-05-28 DIAGNOSIS — C7B02 Secondary carcinoid tumors of liver: Secondary | ICD-10-CM

## 2017-05-28 DIAGNOSIS — C7A00 Malignant carcinoid tumor of unspecified site: Secondary | ICD-10-CM | POA: Insufficient documentation

## 2017-05-28 MED ORDER — INFLUENZA VAC SPLIT QUAD 0.5 ML IM SUSY
0.5000 mL | PREFILLED_SYRINGE | Freq: Once | INTRAMUSCULAR | Status: AC
  Administered 2017-05-28: 0.5 mL via INTRAMUSCULAR

## 2017-05-28 MED ORDER — OCTREOTIDE ACETATE 30 MG IM KIT
30.0000 mg | PACK | Freq: Once | INTRAMUSCULAR | Status: AC
  Administered 2017-05-28: 30 mg via INTRAMUSCULAR
  Filled 2017-05-28: qty 1

## 2017-05-28 MED ORDER — OCTREOTIDE ACETATE 30 MG IM KIT
PACK | INTRAMUSCULAR | Status: AC
  Filled 2017-05-28: qty 1

## 2017-05-28 NOTE — Patient Instructions (Signed)
[place] at [facility] Discharge Instructions  RECOMMENDATIONS MADE BY THE CONSULTANT AND ANY TEST RESULTS WILL BE SENT TO YOUR REFERRING PHYSICIAN.  Sandostatin and flu vaccine given Follow up as scheduled.  Thank you for choosing [place] at [facility] to provide your oncology and hematology care.  To afford each patient quality time with our provider, please arrive at least 15 minutes before your scheduled appointment time.    If you have a lab appointment with the [facility] please come in thru the  Main Entrance and check in at the main information desk  You need to re-schedule your appointment should you arrive 10 or more minutes late.  We strive to give you quality time with our providers, and arriving late affects you and other patients whose appointments are after yours.  Also, if you no show three or more times for appointments you may be dismissed from the clinic at the providers discretion.     Again, thank you for choosing [facility].  Our hope is that these requests will decrease the amount of time that you wait before being seen by our physicians.       _____________________________________________________________  Should you have questions after your visit to [facility], please contact our office at [phone] between the hours of 8:30 a.m. and 4:30 p.m.  Voicemails left after 4:30 p.m. will not be returned until the following business day.  For prescription refill requests, have your pharmacy contact our office.       Resources For Cancer Patients and their Caregivers ? American Cancer Society: Can assist with transportation, wigs, general needs, runs Look Good Feel Better.        825-876-8834 ? Cancer Care: Provides financial assistance, online support groups, medication/co-pay assistance.  1-800-813-HOPE (970)414-0920) ? [employer] Assists [place] cancer  patients and their families through emotional , educational and financial support.  878-876-5522 ? [place] DSS Where to apply for food stamps, Medicaid and utility assistance. (934)247-9774 ? [employer]: Transportation to medical appointments. 814 348 6261 ? Social Security Administration: May apply for disability if have a Stage IV cancer. 475-688-3099 779-590-9350 ? [employer], Disability and Transit Services: Assists with nutrition, care and transit needs. [place] Support Programs: @10RELATIVEDAYS @ > Cancer Support Group  2nd Tuesday of the month 1pm-2pm, Journey Room  > Creative Journey  3rd Tuesday of the month 1130am-1pm, Journey Room  > Look Good Feel Better  1st Wednesday of the month 10am-12 noon, Journey Room (Call [facility] to register [phone])

## 2017-05-28 NOTE — Progress Notes (Signed)
Allen Davenport presents today for injection per MD orders. Sandostatin 30 mg administered IM in left upper buttocks Administration without incident. Patient tolerated well.  Allen Davenport presents today for injection per MD orders. Fluarix 0.5mg  administered IM in right Upper Arm. Administration without incident. Patient tolerated well.  Treatment given per orders. Patient tolerated it well without problems. Vitals stable and discharged home from clinic ambulatory. Follow up as scheduled.

## 2017-06-11 DIAGNOSIS — I10 Essential (primary) hypertension: Secondary | ICD-10-CM | POA: Diagnosis not present

## 2017-06-11 DIAGNOSIS — E7849 Other hyperlipidemia: Secondary | ICD-10-CM | POA: Diagnosis not present

## 2017-06-26 ENCOUNTER — Ambulatory Visit: Payer: Medicare Other | Admitting: Urology

## 2017-06-27 ENCOUNTER — Encounter (HOSPITAL_COMMUNITY): Payer: Self-pay

## 2017-06-27 ENCOUNTER — Encounter (HOSPITAL_COMMUNITY): Payer: Medicare Other | Attending: Oncology

## 2017-06-27 ENCOUNTER — Encounter (HOSPITAL_BASED_OUTPATIENT_CLINIC_OR_DEPARTMENT_OTHER): Payer: Medicare Other | Admitting: Oncology

## 2017-06-27 ENCOUNTER — Encounter (HOSPITAL_BASED_OUTPATIENT_CLINIC_OR_DEPARTMENT_OTHER): Payer: Medicare Other

## 2017-06-27 VITALS — BP 136/66 | HR 51 | Temp 97.6°F | Resp 16 | Wt 217.0 lb

## 2017-06-27 DIAGNOSIS — Z8589 Personal history of malignant neoplasm of other organs and systems: Secondary | ICD-10-CM

## 2017-06-27 DIAGNOSIS — C7A00 Malignant carcinoid tumor of unspecified site: Secondary | ICD-10-CM | POA: Diagnosis not present

## 2017-06-27 DIAGNOSIS — C7B02 Secondary carcinoid tumors of liver: Secondary | ICD-10-CM | POA: Diagnosis not present

## 2017-06-27 DIAGNOSIS — N189 Chronic kidney disease, unspecified: Secondary | ICD-10-CM

## 2017-06-27 DIAGNOSIS — I130 Hypertensive heart and chronic kidney disease with heart failure and stage 1 through stage 4 chronic kidney disease, or unspecified chronic kidney disease: Secondary | ICD-10-CM | POA: Diagnosis not present

## 2017-06-27 DIAGNOSIS — Z8546 Personal history of malignant neoplasm of prostate: Secondary | ICD-10-CM

## 2017-06-27 DIAGNOSIS — Z85528 Personal history of other malignant neoplasm of kidney: Secondary | ICD-10-CM | POA: Insufficient documentation

## 2017-06-27 DIAGNOSIS — D508 Other iron deficiency anemias: Secondary | ICD-10-CM

## 2017-06-27 DIAGNOSIS — N184 Chronic kidney disease, stage 4 (severe): Secondary | ICD-10-CM | POA: Diagnosis not present

## 2017-06-27 DIAGNOSIS — Z79899 Other long term (current) drug therapy: Secondary | ICD-10-CM | POA: Diagnosis not present

## 2017-06-27 DIAGNOSIS — D61818 Other pancytopenia: Secondary | ICD-10-CM | POA: Insufficient documentation

## 2017-06-27 DIAGNOSIS — D631 Anemia in chronic kidney disease: Secondary | ICD-10-CM

## 2017-06-27 DIAGNOSIS — D509 Iron deficiency anemia, unspecified: Secondary | ICD-10-CM

## 2017-06-27 DIAGNOSIS — Z923 Personal history of irradiation: Secondary | ICD-10-CM | POA: Insufficient documentation

## 2017-06-27 DIAGNOSIS — I509 Heart failure, unspecified: Secondary | ICD-10-CM | POA: Diagnosis not present

## 2017-06-27 DIAGNOSIS — Z905 Acquired absence of kidney: Secondary | ICD-10-CM | POA: Insufficient documentation

## 2017-06-27 DIAGNOSIS — E785 Hyperlipidemia, unspecified: Secondary | ICD-10-CM | POA: Insufficient documentation

## 2017-06-27 DIAGNOSIS — Z08 Encounter for follow-up examination after completed treatment for malignant neoplasm: Secondary | ICD-10-CM | POA: Insufficient documentation

## 2017-06-27 LAB — CBC WITH DIFFERENTIAL/PLATELET
BASOS ABS: 0 10*3/uL (ref 0.0–0.1)
BASOS PCT: 1 %
Eosinophils Absolute: 0.3 10*3/uL (ref 0.0–0.7)
Eosinophils Relative: 6 %
HEMATOCRIT: 36.7 % — AB (ref 39.0–52.0)
HEMOGLOBIN: 11.8 g/dL — AB (ref 13.0–17.0)
Lymphocytes Relative: 10 %
Lymphs Abs: 0.5 10*3/uL — ABNORMAL LOW (ref 0.7–4.0)
MCH: 31.4 pg (ref 26.0–34.0)
MCHC: 32.2 g/dL (ref 30.0–36.0)
MCV: 97.6 fL (ref 78.0–100.0)
MONO ABS: 0.6 10*3/uL (ref 0.1–1.0)
Monocytes Relative: 11 %
NEUTROS ABS: 3.6 10*3/uL (ref 1.7–7.7)
NEUTROS PCT: 72 %
Platelets: 118 10*3/uL — ABNORMAL LOW (ref 150–400)
RBC: 3.76 MIL/uL — ABNORMAL LOW (ref 4.22–5.81)
RDW: 13 % (ref 11.5–15.5)
WBC: 5 10*3/uL (ref 4.0–10.5)

## 2017-06-27 LAB — COMPREHENSIVE METABOLIC PANEL
ALBUMIN: 3.6 g/dL (ref 3.5–5.0)
ALT: 28 U/L (ref 17–63)
AST: 63 U/L — AB (ref 15–41)
Alkaline Phosphatase: 51 U/L (ref 38–126)
Anion gap: 6 (ref 5–15)
BILIRUBIN TOTAL: 0.8 mg/dL (ref 0.3–1.2)
BUN: 43 mg/dL — AB (ref 6–20)
CO2: 25 mmol/L (ref 22–32)
Calcium: 8.8 mg/dL — ABNORMAL LOW (ref 8.9–10.3)
Chloride: 105 mmol/L (ref 101–111)
Creatinine, Ser: 3.66 mg/dL — ABNORMAL HIGH (ref 0.61–1.24)
GFR calc Af Amer: 17 mL/min — ABNORMAL LOW (ref >60)
GFR calc non Af Amer: 15 mL/min — ABNORMAL LOW (ref >60)
GLUCOSE: 72 mg/dL (ref 65–99)
POTASSIUM: 4.5 mmol/L (ref 3.5–5.1)
SODIUM: 136 mmol/L (ref 135–145)
TOTAL PROTEIN: 6.3 g/dL — AB (ref 6.5–8.1)

## 2017-06-27 LAB — IRON AND TIBC
Iron: 74 ug/dL (ref 45–182)
Saturation Ratios: 31 % (ref 17.9–39.5)
TIBC: 241 ug/dL — ABNORMAL LOW (ref 250–450)
UIBC: 167 ug/dL

## 2017-06-27 LAB — FERRITIN: FERRITIN: 74 ng/mL (ref 24–336)

## 2017-06-27 MED ORDER — OCTREOTIDE ACETATE 30 MG IM KIT
30.0000 mg | PACK | Freq: Once | INTRAMUSCULAR | Status: AC
  Administered 2017-06-27: 30 mg via INTRAMUSCULAR

## 2017-06-27 NOTE — Patient Instructions (Signed)
[facility] at [facility]  Discharge Instructions:  You were seen by Dr. Zhou today. _______________________________________________________________  Thank you for choosing [facility] at [facility] to provide your oncology and hematology care.  To afford each patient quality time with our providers, please arrive at least 15 minutes before your scheduled appointment.  You need to re-schedule your appointment if you arrive 10 or more minutes late.  We strive to give you quality time with our providers, and arriving late affects you and other patients whose appointments are after yours.  Also, if you no show three or more times for appointments you may be dismissed from the clinic.  Again, thank you for choosing [facility] at [facility]. Our hope is that these requests will allow you access to exceptional care and in a timely manner. _______________________________________________________________  If you have questions after your visit, please contact our office at [phone] between the hours of 8:30 a.m. and 5:00 p.m. Voicemails left after 4:30 p.m. will not be returned until the following business day. _______________________________________________________________  For prescription refill requests, have your pharmacy contact our office. _______________________________________________________________  Recommendations made by the consultant and any test results will be sent to your referring physician. _______________________________________________________________ 

## 2017-06-27 NOTE — Progress Notes (Signed)
[place] [employer], [place] [address]   CLINIC:  Medical Oncology/Hematology  PCP:  [name], MD [place] [id number] [phone]   REASON FOR VISIT:  Follow-up for malignant carcinoid tumor AND h/o H&N cancer, kidney cancer, and prostate cancer.   CURRENT THERAPY: Sandostatin injection every 28 days AND Observation    BRIEF ONCOLOGIC HISTORY:  (From Dr. Donald Pore last note on 07/19/16)    HISTORY OF PRESENT ILLNESS:  (From Dr. Donald Pore last note on 07/19/16)      INTERVAL HISTORY:  Allen Davenport 76 y.o. male presents for continued follow-up of carcinoid tumor on Sandostatin, as well as history of multiple malignancies.  Patient presents today for continued follow up. He states that he has been doing well. He denies any severe diarrhea, he states he gets diarrhea about once a month. He is tolerating sandostatin without any side effects. ROS was negative.  REVIEW OF SYSTEMS:  Review of Systems  Constitutional: Negative for appetite change, chills, fatigue and fever.  HENT:  Negative.  Negative for lump/mass and nosebleeds.   Eyes: Negative.   Respiratory: Negative.  Negative for cough and shortness of breath.   Cardiovascular: Negative.  Negative for chest pain and leg swelling.  Gastrointestinal: Negative.  Negative for abdominal pain, blood in stool, constipation, diarrhea, nausea and vomiting.  Endocrine: Negative.   Genitourinary: Negative.  Negative for dysuria and hematuria.   Musculoskeletal: Negative.  Negative for arthralgias.  Skin: Negative.  Negative for rash.  Neurological: Negative.  Negative for dizziness and headaches.  Hematological: Negative.  Negative for adenopathy. Does not bruise/bleed easily.  Psychiatric/Behavioral: Negative.  Negative for depression and sleep disturbance. The patient is not nervous/anxious.      PAST MEDICAL/SURGICAL HISTORY:  Past Medical History:  Diagnosis Date  .  Anemia   . Cancer [facility])    right renal . Prostate- Radiation treatment  . CHF (congestive heart failure) ([place])   . Chronic kidney disease    stage 4  . Edema   . Heart murmur    "nothing to worry about"  . Hyperlipidemia   . Hypertension   . Malignant carcinoid tumor ([place]) 01/03/2016   Past Surgical History:  Procedure Laterality Date  . AV FISTULA PLACEMENT Left 10/20/2014   Procedure: Left Arm ARTERIOVENOUS (AV) FISTULA CREATION;  Surgeon: Elam Dutch, MD;  Location: [facility];  Service: Vascular;  Laterality: Left;  . NEPHRECTOMY Right 2010     SOCIAL HISTORY:  Social History   Socioeconomic History  . Marital status: Married    Spouse name: Not on file  . Number of children: Not on file  . Years of education: Not on file  . Highest education level: Not on file  Social Needs  . Financial resource strain: Not on file  . Food insecurity - worry: Not on file  . Food insecurity - inability: Not on file  . Transportation needs - medical: Not on file  . Transportation needs - non-medical: Not on file  Occupational History  . Not on file  Tobacco Use  . Smoking status: Never Smoker  . Smokeless tobacco: Never Used  Substance and Sexual Activity  . Alcohol use: No    Alcohol/week: 0.0 oz  . Drug use: No  . Sexual activity: Not on file  Other Topics Concern  . Not on file  Social History Narrative  . Not on file    FAMILY HISTORY:  Family History  Problem Relation Age of Onset  . Cancer Mother   . Hypertension Father   . Cancer Sister     CURRENT MEDICATIONS:  Outpatient Encounter Medications as of 06/27/2017  Medication Sig Note  . alendronate (FOSAMAX) 70 MG tablet TAKE ONE TABLET BY MOUTH ONCE A WEEK IN THE MORNING WITH A FULL GLASS OF WATER ON AN EMPTY STOMACH. DO NOT LAY DOWN FOR 30 MINUTES. 07/19/2016: Received from: External Pharmacy  . atorvastatin (LIPITOR) 80 MG tablet Take 80 mg by mouth daily.   . calcitRIOL (ROCALTROL) 0.25 MCG capsule  04/26/2016:  Received from: External Pharmacy  . cholecalciferol (VITAMIN D) 1000 UNITS tablet Take 1,000 Units by mouth daily.   [name] FERREX 150 150 MG capsule Take 150 mg by mouth 2 (two) times daily.   . furosemide (LASIX) 40 MG tablet Take 40 mg by mouth.   . hydrALAZINE (APRESOLINE) 25 MG tablet TAKE THREE (3) TABLETS BY MOUTH TWICE DAILY 07/19/2016: Received from: External Pharmacy  . hydrALAZINE (APRESOLINE) 50 MG tablet Take 50 mg by mouth 2 (two) times daily.   [name] labetalol (NORMODYNE) 300 MG tablet Take 300 mg by mouth 2 (two) times daily.   . Omega-3 Fatty Acids (FISH OIL) 1000 MG CAPS Take 1 capsule by mouth daily.   . sodium bicarbonate 650 MG tablet Take 650 mg by mouth daily.   . tamsulosin (FLOMAX) 0.4 MG CAPS capsule  04/26/2016: Received from: External Pharmacy   No facility-administered encounter medications on file as of 06/27/2017.     ALLERGIES:  No Known Allergies   PHYSICAL EXAM:  ECOG Performance status: 0 - Asymptomatic   Vitals:   06/27/17 1332  BP: 136/66  Pulse: (!) 51  Resp: 16  Temp: 97.6 F (36.4 C)  SpO2: 100%   Filed Weights   06/27/17 1332  Weight: 217 lb (98.4 kg)    Physical Exam  Constitutional: He is oriented to person, place, and time and well-developed, well-nourished, and in no distress.  HENT:  Head: Normocephalic.  Mouth/Throat: Oropharynx is clear and moist. No oropharyngeal exudate.  Eyes: Conjunctivae are normal. Pupils are equal, round, and reactive to light. No scleral icterus.  Neck: Normal range of motion. Neck supple.  Cardiovascular: Normal rate, regular rhythm and normal heart sounds.  Pulmonary/Chest: Effort normal and breath sounds normal. No respiratory distress.  Abdominal: Soft. Bowel sounds are normal. There is no tenderness. There is no rebound.  Musculoskeletal: Normal range of motion. He exhibits no edema.  Lymphadenopathy:    He has no cervical adenopathy.       Right: No supraclavicular adenopathy present.       Left: No  supraclavicular adenopathy present.  Neurological: He is alert and oriented to person, place, and time. No cranial nerve deficit. Gait normal.  Skin: Skin is warm and dry. No rash noted.  Psychiatric: Mood, memory, affect and judgment normal.  Nursing note and vitals reviewed.    LABORATORY DATA:  I have reviewed the labs as listed.  CBC    Component Value Date/Time   WBC 5.0 06/27/2017 1312   RBC 3.76 (L) 06/27/2017 1312   HGB 11.8 (L) 06/27/2017 1312   HCT 36.7 (L) 06/27/2017 1312   PLT 118 (L) 06/27/2017 1312   MCV 97.6 06/27/2017 1312   MCH 31.4 06/27/2017 1312   MCHC 32.2 06/27/2017 1312   RDW 13.0 06/27/2017 1312   LYMPHSABS 0.5 (L) 06/27/2017 1312   MONOABS 0.6 06/27/2017 1312   EOSABS 0.3 06/27/2017 1312  BASOSABS 0.0 06/27/2017 1312   CMP Latest Ref Rng & Units 06/27/2017 03/28/2017 10/11/2016  Glucose 65 - 99 mg/dL 72 107(H) 81  BUN 6 - 20 mg/dL 43(H) 46(H) 49(H)  Creatinine 0.61 - 1.24 mg/dL 3.66(H) 3.75(H) 3.84(H)  Sodium 135 - 145 mmol/L 136 137 136  Potassium 3.5 - 5.1 mmol/L 4.5 4.3 4.2  Chloride 101 - 111 mmol/L 105 104 104  CO2 22 - 32 mmol/L 25 26 26   Calcium 8.9 - 10.3 mg/dL 8.8(L) 8.9 8.7(L)  Total Protein 6.5 - 8.1 g/dL 6.3(L) 6.2(L) 6.1(L)  Total Bilirubin 0.3 - 1.2 mg/dL 0.8 0.7 0.8  Alkaline Phos 38 - 126 U/L 51 53 55  AST 15 - 41 U/L 63(H) 52(H) 41  ALT 17 - 63 U/L 28 26 23     PENDING LABS:    DIAGNOSTIC IMAGING:  MRI abd: 12/01/14 [facility])       PATHOLOGY:     ASSESSMENT & PLAN:   Malignant carcinoid tumor:  -Reportedly 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  Previously managed at [facility] in [place], [place].  [name] transferred his care to [facility] in 04/2016.  -We have been monitoring chromogranin A and serum serotonin levels; he understands that these tests are not specific for just carcinoid tumors and can be elevated in other clinical  scenarios. However, it can be helpful to monitor the trends over time.  -His chromogranin A levels have been stable. Patient is asymptomatic from his carcinoid disease. -Continue Sandostatin LAR 30mg  monthly, he has been tolerating it well. He will get a dose today.      Anemia in the setting of CKD and iron deficiency -s/p 2 dose of Feraheme on 01/10/2017 and 01/18/2017. -Hemoglobin is 11.8 g/dL today. Will follow up on his iron studies. -Will check his CBC, iron studies on the next visit.  Pancytopenia:  -Chronic cytopenias stable.  History of prostate cancer: -Diagnosed in 2016; treated with radiation therapy with Dr. Tammi Klippel. -Maintain follow-up with urology.   History of kidney cancer: -Diagnosed in 2010; treated with right nephrectomy at [facility] follow-up with nephrology.   History of H&N cancer: -Diagnosed in 2009; treated with surgery and radiation therapy.  -No evidence of recurrent disease on physical exam. We will continue to monitor.       Dispo:  -Continue monthly Sandostatin.  -Return to cancer center in 3 months with labs.   Orders Placed This Encounter  Procedures  . CBC with Differential    Standing Status:   Future    Standing Expiration Date:   06/27/2018  . Comprehensive metabolic panel    Standing Status:   Future    Standing Expiration Date:   06/27/2018  . Chromogranin A    Standing Status:   Future    Standing Expiration Date:   06/27/2018  . Iron and TIBC    Standing Status:   Future    Standing Expiration Date:   06/27/2018  . Ferritin    Standing Status:   Future    Standing Expiration Date:   06/27/2018    All questions were answered to patient's stated satisfaction. Encouraged patient to call with any new concerns or questions before his next visit to the cancer center and we can certain see him sooner, if needed.    Twana First, MD

## 2017-06-27 NOTE — Patient Instructions (Addendum)
[place] at [facility] Discharge Instructions  RECOMMENDATIONS MADE BY THE CONSULTANT AND ANY TEST RESULTS WILL BE SENT TO YOUR REFERRING PHYSICIAN.  Received Sandostatin injection today. Follow-up as scheduled. Call clinic for any questions or concerns  Thank you for choosing [place] at [facility] to provide your oncology and hematology care.  To afford each patient quality time with our provider, please arrive at least 15 minutes before your scheduled appointment time.    If you have a lab appointment with the [facility] please come in thru the  Main Entrance and check in at the main information desk  You need to re-schedule your appointment should you arrive 10 or more minutes late.  We strive to give you quality time with our providers, and arriving late affects you and other patients whose appointments are after yours.  Also, if you no show three or more times for appointments you may be dismissed from the clinic at the providers discretion.     Again, thank you for choosing [facility].  Our hope is that these requests will decrease the amount of time that you wait before being seen by our physicians.       _____________________________________________________________  Should you have questions after your visit to [facility], please contact our office at [phone] between the hours of 8:30 a.m. and 4:30 p.m.  Voicemails left after 4:30 p.m. will not be returned until the following business day.  For prescription refill requests, have your pharmacy contact our office.       Resources For Cancer Patients and their Caregivers ? American Cancer Society: Can assist with transportation, wigs, general needs, runs Look Good Feel Better.        848-117-2158 ? Cancer Care: Provides financial assistance, online support groups, medication/co-pay assistance.  1-800-813-HOPE 276-354-4971) ? [place] Assists [place] cancer patients and their families through emotional , educational and financial support.  (501)325-6687 ? [place] DSS Where to apply for food stamps, Medicaid and utility assistance. 803-667-5430 ? RCATS: Transportation to medical appointments. (442)595-4131 ? Social Security Administration: May apply for disability if have a Stage IV cancer. [phone] ? [employer], Disability and Transit Services: Assists with nutrition, care and transit needs. [place] Support Programs: @10RELATIVEDAYS @ > Cancer Support Group  2nd Tuesday of the month 1pm-2pm, Journey Room  > Creative Journey  3rd Tuesday of the month 1130am-1pm, Journey Room  > Look Good Feel Better  1st Wednesday of the month 10am-12 noon, Journey Room (Call [place] to register [phone])

## 2017-06-27 NOTE — Progress Notes (Signed)
Allen Davenport tolerated Sandostatin injection well without complaints or incident. VSS Pt discharged self ambulatory in satisfactory condition 

## 2017-06-28 DIAGNOSIS — D631 Anemia in chronic kidney disease: Secondary | ICD-10-CM | POA: Diagnosis not present

## 2017-06-28 DIAGNOSIS — N184 Chronic kidney disease, stage 4 (severe): Secondary | ICD-10-CM | POA: Diagnosis not present

## 2017-06-29 LAB — CHROMOGRANIN A: Chromogranin A: 17 nmol/L — ABNORMAL HIGH (ref 0–5)

## 2017-06-29 LAB — SEROTONIN SERUM: SEROTONIN, SERUM: 492 ng/mL — AB (ref 21–321)

## 2017-07-18 DIAGNOSIS — E1140 Type 2 diabetes mellitus with diabetic neuropathy, unspecified: Secondary | ICD-10-CM | POA: Diagnosis not present

## 2017-07-18 DIAGNOSIS — E1151 Type 2 diabetes mellitus with diabetic peripheral angiopathy without gangrene: Secondary | ICD-10-CM | POA: Diagnosis not present

## 2017-07-26 DIAGNOSIS — N184 Chronic kidney disease, stage 4 (severe): Secondary | ICD-10-CM | POA: Diagnosis not present

## 2017-07-26 DIAGNOSIS — R809 Proteinuria, unspecified: Secondary | ICD-10-CM | POA: Diagnosis not present

## 2017-07-26 DIAGNOSIS — D631 Anemia in chronic kidney disease: Secondary | ICD-10-CM | POA: Diagnosis not present

## 2017-07-26 DIAGNOSIS — E559 Vitamin D deficiency, unspecified: Secondary | ICD-10-CM | POA: Diagnosis not present

## 2017-07-26 DIAGNOSIS — Z79899 Other long term (current) drug therapy: Secondary | ICD-10-CM | POA: Diagnosis not present

## 2017-07-27 ENCOUNTER — Encounter (HOSPITAL_COMMUNITY): Payer: Self-pay

## 2017-07-27 ENCOUNTER — Inpatient Hospital Stay (HOSPITAL_COMMUNITY): Payer: Medicare Other | Attending: Oncology

## 2017-07-27 VITALS — BP 123/73 | HR 62 | Temp 97.8°F | Resp 20

## 2017-07-27 DIAGNOSIS — C7B02 Secondary carcinoid tumors of liver: Secondary | ICD-10-CM | POA: Insufficient documentation

## 2017-07-27 DIAGNOSIS — C7A00 Malignant carcinoid tumor of unspecified site: Secondary | ICD-10-CM | POA: Diagnosis not present

## 2017-07-27 MED ORDER — OCTREOTIDE ACETATE 30 MG IM KIT
30.0000 mg | PACK | Freq: Once | INTRAMUSCULAR | Status: AC
  Administered 2017-07-27: 30 mg via INTRAMUSCULAR

## 2017-07-27 NOTE — Progress Notes (Signed)
Patient tolerated sandostatin shot with no complaints voiced.  Injection site clean and dry with no bruising or swelling noted at site.  Band aid applied.  VSS wit discharge and left ambulatory with no s/s of distress noted.

## 2017-07-27 NOTE — Patient Instructions (Signed)
[place] at [facility]  Discharge Instructions:  You received a sandostatin shot today.  _______________________________________________________________  Thank you for choosing [place] at [facility] to provide your oncology and hematology care.  To afford each patient quality time with our providers, please arrive at least 15 minutes before your scheduled appointment.  You need to re-schedule your appointment if you arrive 10 or more minutes late.  We strive to give you quality time with our providers, and arriving late affects you and other patients whose appointments are after yours.  Also, if you no show three or more times for appointments you may be dismissed from the clinic.  Again, thank you for choosing [place] at [facility] hope is that these requests will allow you access to exceptional care and in a timely manner. _______________________________________________________________  If you have questions after your visit, please contact our office at [phone] between the hours of 8:30 a.m. and 5:00 p.m. Voicemails left after 4:30 p.m. will not be returned until the following business day. _______________________________________________________________  For prescription refill requests, have your pharmacy contact our office. _______________________________________________________________  Recommendations made by the consultant and any test results will be sent to your referring physician. _______________________________________________________________

## 2017-08-02 DIAGNOSIS — N184 Chronic kidney disease, stage 4 (severe): Secondary | ICD-10-CM | POA: Diagnosis not present

## 2017-08-02 DIAGNOSIS — I10 Essential (primary) hypertension: Secondary | ICD-10-CM | POA: Diagnosis not present

## 2017-08-02 DIAGNOSIS — M818 Other osteoporosis without current pathological fracture: Secondary | ICD-10-CM | POA: Diagnosis not present

## 2017-08-02 DIAGNOSIS — E7849 Other hyperlipidemia: Secondary | ICD-10-CM | POA: Diagnosis not present

## 2017-08-07 ENCOUNTER — Ambulatory Visit (INDEPENDENT_AMBULATORY_CARE_PROVIDER_SITE_OTHER): Payer: Medicare Other | Admitting: Urology

## 2017-08-07 DIAGNOSIS — C61 Malignant neoplasm of prostate: Secondary | ICD-10-CM | POA: Diagnosis not present

## 2017-08-27 ENCOUNTER — Other Ambulatory Visit: Payer: Self-pay

## 2017-08-27 ENCOUNTER — Inpatient Hospital Stay (HOSPITAL_COMMUNITY): Payer: Medicare Other | Attending: Oncology

## 2017-08-27 ENCOUNTER — Encounter (HOSPITAL_COMMUNITY): Payer: Self-pay

## 2017-08-27 VITALS — BP 141/74 | HR 58 | Temp 97.6°F | Resp 20

## 2017-08-27 DIAGNOSIS — C7B02 Secondary carcinoid tumors of liver: Secondary | ICD-10-CM | POA: Insufficient documentation

## 2017-08-27 DIAGNOSIS — C7A00 Malignant carcinoid tumor of unspecified site: Secondary | ICD-10-CM | POA: Diagnosis not present

## 2017-08-27 MED ORDER — OCTREOTIDE ACETATE 30 MG IM KIT
30.0000 mg | PACK | Freq: Once | INTRAMUSCULAR | Status: AC
  Administered 2017-08-27: 30 mg via INTRAMUSCULAR

## 2017-08-27 NOTE — Progress Notes (Signed)
Allen Davenport presents today for injection per MD orders. Sandostatin  30 mg administered IM  in right upper outer buttocks Administration without incident. Patient tolerated well.   Treatment given per orders. Patient tolerated it well without problems. Vitals stable and discharged home from clinic ambulatory. Follow up as scheduled.

## 2017-08-27 NOTE — Patient Instructions (Signed)
[place] at [facility] Discharge Instructions  RECOMMENDATIONS MADE BY THE CONSULTANT AND ANY TEST RESULTS WILL BE SENT TO YOUR REFERRING PHYSICIAN.  Sandostatin given Follow up as scheduled.  Thank you for choosing [place] at [facility] to provide your oncology and hematology care.  To afford each patient quality time with our provider, please arrive at least 15 minutes before your scheduled appointment time.    If you have a lab appointment with the [facility] please come in thru the  Main Entrance and check in at the main information desk  You need to re-schedule your appointment should you arrive 10 or more minutes late.  We strive to give you quality time with our providers, and arriving late affects you and other patients whose appointments are after yours.  Also, if you no show three or more times for appointments you may be dismissed from the clinic at the providers discretion.     Again, thank you for choosing [facility].  Our hope is that these requests will decrease the amount of time that you wait before being seen by our physicians.       _____________________________________________________________  Should you have questions after your visit to [facility], please contact our office at [phone] between the hours of 8:30 a.m. and 4:30 p.m.  Voicemails left after 4:30 p.m. will not be returned until the following business day.  For prescription refill requests, have your pharmacy contact our office.       Resources For Cancer Patients and their Caregivers ? American Cancer Society: Can assist with transportation, wigs, general needs, runs Look Good Feel Better.        201-151-6511 ? Cancer Care: Provides financial assistance, online support groups, medication/co-pay assistance.  1-800-813-HOPE (786)439-8733) ? [place] Assists [place] cancer patients and their  families through emotional , educational and financial support.  256-869-0086 ? [place] DSS Where to apply for food stamps, Medicaid and utility assistance. 276-698-8430 ? RCATS: Transportation to medical appointments. 979-633-1448 ? Social Security Administration: May apply for disability if have a Stage IV cancer. 954-265-2874 602-625-1723 ? [employer], Disability and Transit Services: Assists with nutrition, care and transit needs. [place] Support Programs: @10RELATIVEDAYS @ > Cancer Support Group  2nd Tuesday of the month 1pm-2pm, Journey Room  > Creative Journey  3rd Tuesday of the month 1130am-1pm, Journey Room  > Look Good Feel Better  1st Wednesday of the month 10am-12 noon, Journey Room (Call [place] to register [phone])

## 2017-08-28 DIAGNOSIS — I10 Essential (primary) hypertension: Secondary | ICD-10-CM | POA: Diagnosis not present

## 2017-08-28 DIAGNOSIS — E7849 Other hyperlipidemia: Secondary | ICD-10-CM | POA: Diagnosis not present

## 2017-08-28 DIAGNOSIS — N184 Chronic kidney disease, stage 4 (severe): Secondary | ICD-10-CM | POA: Diagnosis not present

## 2017-08-29 DIAGNOSIS — H35363 Drusen (degenerative) of macula, bilateral: Secondary | ICD-10-CM | POA: Diagnosis not present

## 2017-09-20 DIAGNOSIS — N184 Chronic kidney disease, stage 4 (severe): Secondary | ICD-10-CM | POA: Diagnosis not present

## 2017-09-20 DIAGNOSIS — Z79899 Other long term (current) drug therapy: Secondary | ICD-10-CM | POA: Diagnosis not present

## 2017-09-20 DIAGNOSIS — D631 Anemia in chronic kidney disease: Secondary | ICD-10-CM | POA: Diagnosis not present

## 2017-09-20 DIAGNOSIS — R809 Proteinuria, unspecified: Secondary | ICD-10-CM | POA: Diagnosis not present

## 2017-09-20 DIAGNOSIS — E559 Vitamin D deficiency, unspecified: Secondary | ICD-10-CM | POA: Diagnosis not present

## 2017-09-21 DIAGNOSIS — E7849 Other hyperlipidemia: Secondary | ICD-10-CM | POA: Diagnosis not present

## 2017-09-21 DIAGNOSIS — I10 Essential (primary) hypertension: Secondary | ICD-10-CM | POA: Diagnosis not present

## 2017-09-21 DIAGNOSIS — N184 Chronic kidney disease, stage 4 (severe): Secondary | ICD-10-CM | POA: Diagnosis not present

## 2017-09-24 ENCOUNTER — Other Ambulatory Visit: Payer: Self-pay

## 2017-09-24 ENCOUNTER — Inpatient Hospital Stay (HOSPITAL_COMMUNITY): Payer: Medicare Other | Attending: Internal Medicine

## 2017-09-24 ENCOUNTER — Inpatient Hospital Stay (HOSPITAL_COMMUNITY): Payer: Medicare Other

## 2017-09-24 ENCOUNTER — Inpatient Hospital Stay (HOSPITAL_BASED_OUTPATIENT_CLINIC_OR_DEPARTMENT_OTHER): Payer: Medicare Other | Admitting: Internal Medicine

## 2017-09-24 VITALS — BP 130/70 | HR 71 | Temp 98.1°F | Resp 18 | Ht 77.0 in | Wt 216.4 lb

## 2017-09-24 DIAGNOSIS — D696 Thrombocytopenia, unspecified: Secondary | ICD-10-CM

## 2017-09-24 DIAGNOSIS — Z8546 Personal history of malignant neoplasm of prostate: Secondary | ICD-10-CM | POA: Insufficient documentation

## 2017-09-24 DIAGNOSIS — Z8589 Personal history of malignant neoplasm of other organs and systems: Secondary | ICD-10-CM

## 2017-09-24 DIAGNOSIS — N189 Chronic kidney disease, unspecified: Secondary | ICD-10-CM | POA: Insufficient documentation

## 2017-09-24 DIAGNOSIS — C7A00 Malignant carcinoid tumor of unspecified site: Secondary | ICD-10-CM

## 2017-09-24 DIAGNOSIS — Z85528 Personal history of other malignant neoplasm of kidney: Secondary | ICD-10-CM | POA: Insufficient documentation

## 2017-09-24 DIAGNOSIS — C7B02 Secondary carcinoid tumors of liver: Secondary | ICD-10-CM | POA: Insufficient documentation

## 2017-09-24 DIAGNOSIS — D509 Iron deficiency anemia, unspecified: Secondary | ICD-10-CM | POA: Diagnosis not present

## 2017-09-24 DIAGNOSIS — D631 Anemia in chronic kidney disease: Secondary | ICD-10-CM

## 2017-09-24 LAB — COMPREHENSIVE METABOLIC PANEL
ALT: 18 U/L (ref 17–63)
AST: 36 U/L (ref 15–41)
Albumin: 3.5 g/dL (ref 3.5–5.0)
Alkaline Phosphatase: 51 U/L (ref 38–126)
Anion gap: 9 (ref 5–15)
BUN: 42 mg/dL — ABNORMAL HIGH (ref 6–20)
CHLORIDE: 104 mmol/L (ref 101–111)
CO2: 24 mmol/L (ref 22–32)
Calcium: 8.7 mg/dL — ABNORMAL LOW (ref 8.9–10.3)
Creatinine, Ser: 3.38 mg/dL — ABNORMAL HIGH (ref 0.61–1.24)
GFR, EST AFRICAN AMERICAN: 19 mL/min — AB (ref >60)
GFR, EST NON AFRICAN AMERICAN: 16 mL/min — AB (ref >60)
Glucose, Bld: 139 mg/dL — ABNORMAL HIGH (ref 65–99)
POTASSIUM: 3.9 mmol/L (ref 3.5–5.1)
Sodium: 137 mmol/L (ref 135–145)
Total Bilirubin: 1 mg/dL (ref 0.3–1.2)
Total Protein: 5.9 g/dL — ABNORMAL LOW (ref 6.5–8.1)

## 2017-09-24 LAB — CBC WITH DIFFERENTIAL/PLATELET
BASOS ABS: 0 10*3/uL (ref 0.0–0.1)
Basophils Relative: 1 %
EOS PCT: 7 %
Eosinophils Absolute: 0.3 10*3/uL (ref 0.0–0.7)
HCT: 36.4 % — ABNORMAL LOW (ref 39.0–52.0)
Hemoglobin: 11.8 g/dL — ABNORMAL LOW (ref 13.0–17.0)
LYMPHS ABS: 0.4 10*3/uL — AB (ref 0.7–4.0)
LYMPHS PCT: 11 %
MCH: 30.5 pg (ref 26.0–34.0)
MCHC: 32.4 g/dL (ref 30.0–36.0)
MCV: 94.1 fL (ref 78.0–100.0)
MONO ABS: 0.3 10*3/uL (ref 0.1–1.0)
Monocytes Relative: 8 %
Neutro Abs: 2.7 10*3/uL (ref 1.7–7.7)
Neutrophils Relative %: 73 %
PLATELETS: 111 10*3/uL — AB (ref 150–400)
RBC: 3.87 MIL/uL — ABNORMAL LOW (ref 4.22–5.81)
RDW: 13 % (ref 11.5–15.5)
WBC: 3.7 10*3/uL — ABNORMAL LOW (ref 4.0–10.5)

## 2017-09-24 LAB — IRON AND TIBC
IRON: 76 ug/dL (ref 45–182)
Saturation Ratios: 34 % (ref 17.9–39.5)
TIBC: 227 ug/dL — AB (ref 250–450)
UIBC: 151 ug/dL

## 2017-09-24 LAB — FERRITIN: FERRITIN: 56 ng/mL (ref 24–336)

## 2017-09-25 DIAGNOSIS — R809 Proteinuria, unspecified: Secondary | ICD-10-CM | POA: Diagnosis not present

## 2017-09-25 DIAGNOSIS — I10 Essential (primary) hypertension: Secondary | ICD-10-CM | POA: Diagnosis not present

## 2017-09-25 DIAGNOSIS — N184 Chronic kidney disease, stage 4 (severe): Secondary | ICD-10-CM | POA: Diagnosis not present

## 2017-09-25 DIAGNOSIS — E876 Hypokalemia: Secondary | ICD-10-CM | POA: Diagnosis not present

## 2017-09-25 DIAGNOSIS — E872 Acidosis: Secondary | ICD-10-CM | POA: Diagnosis not present

## 2017-09-25 DIAGNOSIS — D631 Anemia in chronic kidney disease: Secondary | ICD-10-CM | POA: Diagnosis not present

## 2017-09-26 LAB — CHROMOGRANIN A: CHROMOGRANIN A: 23 nmol/L — AB (ref 0–5)

## 2017-10-03 DIAGNOSIS — E1151 Type 2 diabetes mellitus with diabetic peripheral angiopathy without gangrene: Secondary | ICD-10-CM | POA: Diagnosis not present

## 2017-10-03 DIAGNOSIS — E1140 Type 2 diabetes mellitus with diabetic neuropathy, unspecified: Secondary | ICD-10-CM | POA: Diagnosis not present

## 2017-10-22 NOTE — Progress Notes (Signed)
Diagnosis Malignant carcinoid tumor ([place]) - Plan: NM PET (NETSPOT GA 38 DOTATATE) SKULL BASE TO MID THIGH, CBC with Differential/Platelet, Comprehensive metabolic panel, Lactate dehydrogenase, Chromogranin A  Staging Cancer Staging No matching staging information was found for the patient.  CURRENT THERAPY: Sandostatin injection every 28 days AND Observation   Assessment and plan: 1.  Malignant carcinoid tumor.  Reportedly 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  Previously managed at [facility] in [place], [place].  Allen Davenport transferred his care to [facility] in 04/2016.   Recent labs done 09/24/2017 showed white count 3.7 hemoglobin 11.8 platelets of 111,000.  His ferritin was 56 chromogranin A level was elevated from previous and was 23.  His creatinine is 3.38.  Last Sandostatin dose was February 2019. Chart reviewed and last imaging was at [facility]d: 12[facility]01/14 [facility]    Patient is set up for neuroendocrine tumor PET for repeat staging evaluation especially in light of the rising chromogranin A level.  He will return to clinic to go over the results.  2.  Anemia in the setting of CKD and iron deficiency.  Hemoglobin 11.8.  Ferritin is 56.  Creatinine is 3.38.  Patient was last treated with IV iron in July 2018.  3.  Thrombocytopenia.  Platelet count is slightly decreased at 111,000.  We will continue to monitor labs.  4.  Prostate cancer.  Patient was diagnosed in 2016; treated with radiation therapy with Dr. Tammi Klippel.  He should continue to follow-up with urology as directed. -Maintain follow-up with urology.   5.  KIdney cancer.  Patient was diagnosed in 2010; treated with right nephrectomy at [facility].  Continue to follow-up with urology and nephrology as directed. -Maintain follow-up with nephrology.  He is scheduled for neuroendocrine tumor Pet.    6.  H&N cancer.  Patient was  diagnosed in 2009; treated with surgery and radiation therapy.  He is scheduled for neuroendocrine tumor Pet.     BRIEF ONCOLOGIC HISTORY:  (From Dr. Donald Pore last note on 07/19/16)    Interval History:   (From Dr. Donald Pore last note on 07/19/16)    Current Status: Patient is seen today for follow-up.  He remains on Sandostatin which was last administered in February 2019.  Problem List Patient Active Problem List   Diagnosis Date Noted  . Malignant carcinoid tumor ([place]) [C7A.00] 01/03/2016  . Postablative hypothyroidism [E89.0] 06/04/2015  . Anemia, chronic renal failure, stage 4 (severe) (HCC) [N18.4, D63.1] 08/19/2014  . Iron deficiency anemia [D50.9] 08/19/2014  . Kidney disease with fluid retention [N28.9] 08/19/2014  . Malabsorption due to intolerance, not elsewhere classified [K90.49] 08/19/2014  . Renal carcinoma, right ([place]) [C64.1] 08/19/2014  . Prostate cancer ([place]) [C61] 05/13/2014  . Anemia in chronic renal disease [N18.9, D63.1] 01/21/2014  . Renal cancer, right ([place]) [C64.1] 01/21/2014  . Vitamin B12 deficiency anemia due to malabsorption with proteinuria [D51.1] 01/21/2014  . Vitamin B12 deficiency anemia due to selective vitamin B12 malabsorption with proteinuria [D51.1] 01/21/2014  . Carcinoid (except of appendix) [D3A.00] 01/21/2014  . Head and neck cancer ([place]) [C76.0] 05/28/2013  . Heme + stool [R19.5] 05/28/2013  . Hiatal hernia [K44.9] 05/28/2013  . Hypercholesterolemia [E78.00] 05/28/2013  . Hypertension [I10] 05/28/2013  . Iron (Fe) deficiency anemia [D50.9] 05/28/2013  . Kidney carcinoma ([place]) [C64.9] 05/28/2013  . 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 Anemia   . Cancer [facility])    right renal . Prostate- Radiation treatment  . CHF (congestive heart failure) ([place])   . Chronic kidney disease    stage 4  . Edema   . Heart murmur    "nothing to worry about"  . Hyperlipidemia     . Hypertension   . Malignant carcinoid tumor ([place]) 01/03/2016    Past Surgical History Past Surgical History:  Procedure Laterality Date  . AV FISTULA PLACEMENT Left 10/20/2014   Procedure: Left Arm ARTERIOVENOUS (AV) FISTULA CREATION;  Surgeon: Elam Dutch, MD;  Location: [place];  Service: Vascular;  Laterality: Left;  . NEPHRECTOMY Right 2010    Family History Family History  Problem Relation Age of Onset  . Cancer Mother   . Hypertension Father   . Cancer Sister      Social History  reports that he has never smoked. He has never used smokeless tobacco. He reports that he does not drink alcohol or use drugs.  Medications  Current Outpatient Medications:  .  alendronate (FOSAMAX) 70 MG tablet, TAKE ONE TABLET BY MOUTH ONCE A WEEK IN THE MORNING WITH A FULL GLASS OF WATER ON AN EMPTY STOMACH. DO NOT LAY DOWN FOR 30 MINUTES., Disp: , Rfl: 0 .  atorvastatin (LIPITOR) 80 MG tablet, Take 80 mg by mouth daily., Disp: , Rfl:  .  calcitRIOL (ROCALTROL) 0.25 MCG capsule, , Disp: , Rfl:  .  cholecalciferol (VITAMIN D) 1000 UNITS tablet, Take 1,000 Units by mouth daily., Disp: , Rfl:  .  FERREX 150 150 MG capsule, Take 150 mg by mouth 2 (two) times daily., Disp: , Rfl: 3 .  furosemide (LASIX) 40 MG tablet, Take 40 mg by mouth., Disp: , Rfl:  .  hydrALAZINE (APRESOLINE) 25 MG tablet, TAKE THREE (3) TABLETS BY MOUTH TWICE DAILY, Disp: , Rfl: 99 .  hydrALAZINE (APRESOLINE) 50 MG tablet, Take 50 mg by mouth 2 (two) times daily., Disp: , Rfl: 0 .  labetalol (NORMODYNE) 300 MG tablet, Take 300 mg by mouth 2 (two) times daily., Disp: , Rfl:  .  Omega-3 Fatty Acids (FISH OIL) 1000 MG CAPS, Take 1 capsule by mouth daily., Disp: , Rfl:  .  sodium bicarbonate 650 MG tablet, Take 650 mg by mouth daily., Disp: , Rfl:  .  tamsulosin (FLOMAX) 0.4 MG CAPS capsule, , Disp: , Rfl:   Allergies Patient has no known allergies.  Review of Systems Review of Systems - Oncology ROS as per HPI otherwise  12 point ROS is negative.   Physical Exam  Vitals Wt Readings from Last 3 Encounters:  09/24/17 216 lb 6.4 oz (98.2 kg)  06/27/17 217 lb (98.4 kg)  03/28/17 213 lb 3.2 oz (96.7 kg)   Temp Readings from Last 3 Encounters:  09/24/17 98.1 F (36.7 C) (Oral)  08/27/17 97.6 F (36.4 C) (Oral)  07/27/17 97.8 F (36.6 C) (Oral)   BP Readings from Last 3 Encounters:  09/24/17 130/70  08/27/17 (!) 141/74  07/27/17 123/73   Pulse Readings from Last 3 Encounters:  09/24/17 71  08/27/17 (!) 58  07/27/17 62   Constitutional: Well-developed, well-nourished, and in no distress.   HENT: Head: Normocephalic and atraumatic.  Mouth/Throat: No oropharyngeal exudate. Mucosa moist. Eyes: Pupils are equal, round, and reactive to light. Conjunctivae are normal. No scleral icterus.  Neck: Normal range of motion. Neck supple. No JVD present.  Cardiovascular: Normal rate, regular rhythm and normal heart sounds.  Exam reveals no gallop and no friction rub.   No murmur heard. Pulmonary/Chest: Effort normal and breath sounds normal. No respiratory distress. No wheezes.No rales.  Abdominal: Soft. Bowel sounds are normal. No distension. There is no tenderness. There is no guarding.  Musculoskeletal: No edema or tenderness.  Lymphadenopathy: No cervical, axillary or supraclavicular adenopathy.  Neurological: Alert and oriented to person, place, and time. No cranial nerve deficit.  Skin: Skin is warm and dry. No rash noted. No erythema. No pallor.  Psychiatric: Affect and judgment normal.   Labs Appointment on 09/24/2017  Component Date Value Ref Range Status  . WBC 09/24/2017 3.7* 4.0 - 10.5 K/uL Final  . RBC 09/24/2017 3.87* 4.22 - 5.81 MIL/uL Final  . Hemoglobin 09/24/2017 11.8* 13.0 - 17.0 g/dL Final  . HCT 09/24/2017 36.4* 39.0 - 52.0 % Final  . MCV 09/24/2017 94.1  78.0 - 100.0 fL Final  . MCH 09/24/2017 30.5  26.0 - 34.0 pg Final  . MCHC 09/24/2017 32.4  30.0 - 36.0 g/dL Final  . RDW  09/24/2017 13.0  11.5 - 15.5 % Final  . Platelets 09/24/2017 111* 150 - 400 K/uL Final   Comment: CONSISTENT WITH PREVIOUS RESULT SPECIMEN CHECKED FOR CLOTS   . Neutrophils Relative % 09/24/2017 73  % Final  . Neutro Abs 09/24/2017 2.7  1.7 - 7.7 K/uL Final  . Lymphocytes Relative 09/24/2017 11  % Final  . Lymphs Abs 09/24/2017 0.4* 0.7 - 4.0 K/uL Final  . Monocytes Relative 09/24/2017 8  % Final  . Monocytes Absolute 09/24/2017 0.3  0.1 - 1.0 K/uL Final  . Eosinophils Relative 09/24/2017 7  % Final  . Eosinophils Absolute 09/24/2017 0.3  0.0 - 0.7 K/uL Final  . Basophils Relative 09/24/2017 1  % Final  . Basophils Absolute 09/24/2017 0.0  0.0 - 0.1 K/uL Final   Performed at [facility], [address], [place], [place] [address]  . Sodium 09/24/2017 137  135 - 145 mmol/L Final  . Potassium 09/24/2017 3.9  3.5 - 5.1 mmol/L Final  . Chloride 09/24/2017 104  101 - 111 mmol/L Final  . CO2 09/24/2017 24  22 - 32 mmol/L Final  . Glucose, Bld 09/24/2017 139* 65 - 99 mg/dL Final  . BUN 09/24/2017 42* 6 - 20 mg/dL Final  . Creatinine, Ser 09/24/2017 3.38* 0.61 - 1.24 mg/dL Final  . Calcium 09/24/2017 8.7* 8.9 - 10.3 mg/dL Final  . Total Protein 09/24/2017 5.9* 6.5 - 8.1 g/dL Final  . Albumin 09/24/2017 3.5  3.5 - 5.0 g/dL Final  . AST 09/24/2017 36  15 - 41 U/L Final  . ALT 09/24/2017 18  17 - 63 U/L Final  . Alkaline Phosphatase 09/24/2017 51  38 - 126 U/L Final  . Total Bilirubin 09/24/2017 1.0  0.3 - 1.2 mg/dL Final  . GFR calc non Af Amer 09/24/2017 16* >60 mL/min Final  . GFR calc Af Amer 09/24/2017 19* >60 mL/min Final   Comment: (NOTE) The eGFR has been calculated using the CKD EPI equation. This calculation has not been validated in all clinical situations. eGFR's persistently <60 mL/min signify possible Chronic Kidney Disease.   Georgiann Hahn gap 09/24/2017 9  5 - 15 Final   Performed at [facility], [address], [place], [place] [address]  . Chromogranin A 09/24/2017 23* 0 - 5  nmol/L Final   Comment: (NOTE) Chromogranin A performed by Euro-Diagn[employer] methodology. Results for this test are designated to be for research purposes only by the  assay's manufacturer. The performance characteristics of this product have not been established. Results for this test should not be used as absolute evidence of presence or absence of malignant disease without confirmation of the diagnosis by another medically established diagnostic product or procedure. Values obtained with different assay methods or kits cannot be used interchangeably. Performed At: [facility] [place], [place] [id number] Rush Farmer MD [id number] Performed at [facility], [address], [place], [place] [address]   . Iron 09/24/2017 76  45 - 182 ug/dL Final  . TIBC 09/24/2017 227* 250 - 450 ug/dL Final  . Saturation Ratios 09/24/2017 34  17.9 - 39.5 % Final  . UIBC 09/24/2017 151  ug/dL Final   Performed at [facility] [address] [place], [place] [address]  . Ferritin 09/24/2017 56  24 - 336 ng/mL Final   Performed at [facility] [address] [place], [place] [address]     Pathology Orders Placed This Encounter  Procedures  . NM PET (NETSPOT GA 26 DOTATATE) SKULL BASE TO MID THIGH    Standing Status:   Future    Standing Expiration Date:   11/25/2018    Order Specific Question:   If indicated for the ordered procedure, I authorize the administration of a radiopharmaceutical per Radiology protocol    Answer:   Yes    Order Specific Question:   Preferred imaging location?    Answer:   [facility]    Order Specific Question:   Radiology Contrast Protocol - do NOT remove file path    Answer:   [email or web address]  . CBC with Differential/Platelet    Standing Status:   Future    Standing Expiration Date:   09/25/2018  . Comprehensive metabolic panel    Standing Status:   Future    Standing Expiration Date:    09/25/2018  . Lactate dehydrogenase    Standing Status:   Future    Standing Expiration Date:   09/25/2018  . Chromogranin A    Standing Status:   Future    Standing Expiration Date:   09/25/2018       Zoila Shutter MD

## 2017-10-23 ENCOUNTER — Encounter (HOSPITAL_COMMUNITY)
Admission: RE | Admit: 2017-10-23 | Discharge: 2017-10-23 | Disposition: A | Discharge status: Home / Self Care | Payer: Medicare Other | Source: Ambulatory Visit | Attending: Internal Medicine | Admitting: Internal Medicine

## 2017-10-23 DIAGNOSIS — C7A00 Malignant carcinoid tumor of unspecified site: Secondary | ICD-10-CM

## 2017-10-23 DIAGNOSIS — C787 Secondary malignant neoplasm of liver and intrahepatic bile duct: Secondary | ICD-10-CM | POA: Diagnosis not present

## 2017-10-23 DIAGNOSIS — C7A019 Malignant carcinoid tumor of the small intestine, unspecified portion: Secondary | ICD-10-CM | POA: Diagnosis not present

## 2017-10-23 MED ORDER — GALLIUM GA 68 DOTATATE IV KIT
5.4000 | PACK | Freq: Once | INTRAVENOUS | Status: AC
  Administered 2017-10-23: 5.4 via INTRAVENOUS

## 2017-10-25 DIAGNOSIS — R05 Cough: Secondary | ICD-10-CM | POA: Diagnosis not present

## 2017-10-25 DIAGNOSIS — I10 Essential (primary) hypertension: Secondary | ICD-10-CM | POA: Diagnosis not present

## 2017-10-25 DIAGNOSIS — Z8546 Personal history of malignant neoplasm of prostate: Secondary | ICD-10-CM | POA: Diagnosis not present

## 2017-10-25 DIAGNOSIS — Z79899 Other long term (current) drug therapy: Secondary | ICD-10-CM | POA: Diagnosis not present

## 2017-10-25 DIAGNOSIS — J209 Acute bronchitis, unspecified: Secondary | ICD-10-CM | POA: Diagnosis not present

## 2017-10-25 DIAGNOSIS — E7800 Pure hypercholesterolemia, unspecified: Secondary | ICD-10-CM | POA: Diagnosis not present

## 2017-10-25 DIAGNOSIS — Z85528 Personal history of other malignant neoplasm of kidney: Secondary | ICD-10-CM | POA: Diagnosis not present

## 2017-10-29 ENCOUNTER — Ambulatory Visit (HOSPITAL_COMMUNITY): Payer: Medicare Other | Admitting: Internal Medicine

## 2017-10-29 ENCOUNTER — Other Ambulatory Visit (HOSPITAL_COMMUNITY): Payer: Medicare Other

## 2017-10-29 ENCOUNTER — Ambulatory Visit (HOSPITAL_COMMUNITY): Payer: Medicare Other

## 2017-10-30 ENCOUNTER — Encounter (HOSPITAL_COMMUNITY): Payer: Self-pay | Admitting: Hematology

## 2017-10-30 ENCOUNTER — Inpatient Hospital Stay (HOSPITAL_COMMUNITY): Payer: Medicare Other | Attending: Hematology

## 2017-10-30 ENCOUNTER — Inpatient Hospital Stay (HOSPITAL_COMMUNITY): Payer: Medicare Other | Attending: Hematology | Admitting: Hematology

## 2017-10-30 ENCOUNTER — Inpatient Hospital Stay (HOSPITAL_COMMUNITY): Payer: Medicare Other | Attending: Oncology

## 2017-10-30 VITALS — BP 125/67 | HR 61 | Temp 97.6°F | Resp 18 | Wt 212.0 lb

## 2017-10-30 DIAGNOSIS — C7A00 Malignant carcinoid tumor of unspecified site: Secondary | ICD-10-CM

## 2017-10-30 DIAGNOSIS — C7B02 Secondary carcinoid tumors of liver: Secondary | ICD-10-CM | POA: Insufficient documentation

## 2017-10-30 DIAGNOSIS — N189 Chronic kidney disease, unspecified: Secondary | ICD-10-CM | POA: Diagnosis not present

## 2017-10-30 DIAGNOSIS — Z8546 Personal history of malignant neoplasm of prostate: Secondary | ICD-10-CM

## 2017-10-30 DIAGNOSIS — D509 Iron deficiency anemia, unspecified: Secondary | ICD-10-CM

## 2017-10-30 DIAGNOSIS — D631 Anemia in chronic kidney disease: Secondary | ICD-10-CM | POA: Diagnosis not present

## 2017-10-30 DIAGNOSIS — Z85528 Personal history of other malignant neoplasm of kidney: Secondary | ICD-10-CM | POA: Diagnosis not present

## 2017-10-30 DIAGNOSIS — Z8589 Personal history of malignant neoplasm of other organs and systems: Secondary | ICD-10-CM | POA: Insufficient documentation

## 2017-10-30 LAB — CBC WITH DIFFERENTIAL/PLATELET
Basophils Absolute: 0 10*3/uL (ref 0.0–0.1)
Basophils Relative: 1 %
EOS ABS: 0.2 10*3/uL (ref 0.0–0.7)
EOS PCT: 4 %
HCT: 33.7 % — ABNORMAL LOW (ref 39.0–52.0)
HEMOGLOBIN: 11 g/dL — AB (ref 13.0–17.0)
LYMPHS ABS: 0.4 10*3/uL — AB (ref 0.7–4.0)
Lymphocytes Relative: 7 %
MCH: 30.5 pg (ref 26.0–34.0)
MCHC: 32.6 g/dL (ref 30.0–36.0)
MCV: 93.4 fL (ref 78.0–100.0)
MONOS PCT: 11 %
Monocytes Absolute: 0.6 10*3/uL (ref 0.1–1.0)
Neutro Abs: 4.7 10*3/uL (ref 1.7–7.7)
Neutrophils Relative %: 77 %
Platelets: 177 10*3/uL (ref 150–400)
RBC: 3.61 MIL/uL — ABNORMAL LOW (ref 4.22–5.81)
RDW: 12.7 % (ref 11.5–15.5)
WBC: 6 10*3/uL (ref 4.0–10.5)

## 2017-10-30 LAB — COMPREHENSIVE METABOLIC PANEL
ALK PHOS: 78 U/L (ref 38–126)
ALT: 29 U/L (ref 17–63)
ANION GAP: 9 (ref 5–15)
AST: 51 U/L — ABNORMAL HIGH (ref 15–41)
Albumin: 3.1 g/dL — ABNORMAL LOW (ref 3.5–5.0)
BUN: 38 mg/dL — ABNORMAL HIGH (ref 6–20)
CALCIUM: 8.6 mg/dL — AB (ref 8.9–10.3)
CO2: 23 mmol/L (ref 22–32)
Chloride: 105 mmol/L (ref 101–111)
Creatinine, Ser: 3.43 mg/dL — ABNORMAL HIGH (ref 0.61–1.24)
GFR, EST AFRICAN AMERICAN: 19 mL/min — AB (ref >60)
GFR, EST NON AFRICAN AMERICAN: 16 mL/min — AB (ref >60)
Glucose, Bld: 94 mg/dL (ref 65–99)
Potassium: 4.3 mmol/L (ref 3.5–5.1)
SODIUM: 137 mmol/L (ref 135–145)
TOTAL PROTEIN: 6.7 g/dL (ref 6.5–8.1)
Total Bilirubin: 0.6 mg/dL (ref 0.3–1.2)

## 2017-10-30 LAB — LACTATE DEHYDROGENASE: LDH: 186 U/L (ref 98–192)

## 2017-10-30 MED ORDER — SODIUM CHLORIDE 0.9 % IV SOLN
510.0000 mg | Freq: Once | INTRAVENOUS | Status: DC
  Filled 2017-10-30: qty 17

## 2017-10-30 MED ORDER — OCTREOTIDE ACETATE 30 MG IM KIT
30.0000 mg | PACK | Freq: Once | INTRAMUSCULAR | Status: AC
  Administered 2017-10-30: 30 mg via INTRAMUSCULAR

## 2017-10-30 NOTE — Patient Instructions (Signed)
[place] at [facility] Discharge Instructions  Received Sandostatin injection today. Follow-up as scheduled. Call clinic for any questions or concerns   Thank you for choosing [place] at [facility] to provide your oncology and hematology care.  To afford each patient quality time with our provider, please arrive at least 15 minutes before your scheduled appointment time.   If you have a lab appointment with the [facility] please come in thru the  Main Entrance and check in at the main information desk  You need to re-schedule your appointment should you arrive 10 or more minutes late.  We strive to give you quality time with our providers, and arriving late affects you and other patients whose appointments are after yours.  Also, if you no show three or more times for appointments you may be dismissed from the clinic at the providers discretion.     Again, thank you for choosing [facility].  Our hope is that these requests will decrease the amount of time that you wait before being seen by our physicians.       _____________________________________________________________  Should you have questions after your visit to [facility], please contact our office at [phone] between the hours of 8:30 a.m. and 4:30 p.m.  Voicemails left after 4:30 p.m. will not be returned until the following business day.  For prescription refill requests, have your pharmacy contact our office.       Resources For Cancer Patients and their Caregivers ? American Cancer Society: Can assist with transportation, wigs, general needs, runs Look Good Feel Better.        (317) 383-7072 ? Cancer Care: Provides financial assistance, online support groups, medication/co-pay assistance.  1-800-813-HOPE (778)154-2383) ? [place] Assists [place] cancer patients and their families through emotional , educational and  financial support.  (314)451-9535 ? [place] DSS Where to apply for food stamps, Medicaid and utility assistance. 225-614-9868 ? [facility]: Transportation to medical appointments. 409-120-3174 ? Social Security Administration: May apply for disability if have a Stage IV cancer. 832 739 2754 4374980729 ? [employer], Disability and Transit Services: Assists with nutrition, care and transit needs. [place] Support Programs:   > Cancer Support Group  2nd Tuesday of the month 1pm-2pm, Journey Room   > Creative Journey  3rd Tuesday of the month 1130am-1pm, Journey Room

## 2017-10-30 NOTE — Progress Notes (Signed)
Allen Davenport, [place] [address]   CLINIC:  Medical Oncology/Hematology  PCP:  [name], MD [place] [address] [phone]   REASON FOR VISIT:  Follow-up for metastatic carcinoid tumor.  CURRENT THERAPY: Monthly Sandostatin injections.   INTERVAL HISTORY:  Allen Davenport 77 y.o. male returns for follow-up of metastatic carcinoid.  He had PET/CT scan done recently.  His Sandostatin March dose was held.  He denies any abdominal cramping, diarrhea, wheezing or flushing.  He denies any fevers, night sweats or weight loss.  He denies any change in bowel habits.  He denies any recent hospitalizations or infections.    REVIEW OF SYSTEMS:  Review of Systems  Constitutional: Positive for fatigue.  HENT:  Negative.   Respiratory: Negative.   Cardiovascular: Negative.   Gastrointestinal: Negative.   Genitourinary: Negative.    Musculoskeletal: Negative.   Skin: Negative.   Neurological: Negative.   Hematological: Negative.   Psychiatric/Behavioral: Negative.      PAST MEDICAL/SURGICAL HISTORY:  Past Medical History:  Diagnosis Date  . Anemia   . Cancer [facility])    right renal . Prostate- Radiation treatment  . CHF (congestive heart failure) ([place])   . Chronic kidney disease    stage 4  . Edema   . Heart murmur    "nothing to worry about"  . Hyperlipidemia   . Hypertension   . Malignant carcinoid tumor ([place]) 01/03/2016   Past Surgical History:  Procedure Laterality Date  . AV FISTULA PLACEMENT Left 10/20/2014   Procedure: Left Arm ARTERIOVENOUS (AV) FISTULA CREATION;  Surgeon: Elam Dutch, MD;  Location: [place];  Service: Vascular;  Laterality: Left;  . NEPHRECTOMY Right 2010     SOCIAL HISTORY:  Social History   Socioeconomic History  . Marital status: Married    Spouse name: Not on file  . Number of children: Not on file  . Years of education: Not on file  . Highest education level: Not on file    Occupational History  . Not on file  Social Needs  . Financial resource strain: Not on file  . Food insecurity:    Worry: Not on file    Inability: Not on file  . Transportation needs:    Medical: Not on file    Non-medical: Not on file  Tobacco Use  . Smoking status: Never Smoker  . Smokeless tobacco: Never Used  Substance and Sexual Activity  . Alcohol use: No    Alcohol/week: 0.0 oz  . Drug use: No  . Sexual activity: Not on file  Lifestyle  . Physical activity:    Days per week: Not on file    Minutes per session: Not on file  . Stress: Not on file  Relationships  . Social connections:    Talks on phone: Not on file    Gets together: Not on file    Attends religious service: Not on file    Active member of club or organization: Not on file    Attends meetings of clubs or organizations: Not on file    Relationship status: Not on file  . Intimate partner violence:    Fear of current or ex partner: Not on file    Emotionally abused: Not on file    Physically abused: Not on file    Forced sexual activity: Not on file  Other Topics Concern  . Not on file  Social History Narrative  .  Not on file    FAMILY HISTORY:  Family History  Problem Relation Age of Onset  . Cancer Mother   . Hypertension Father   . Cancer Sister     CURRENT MEDICATIONS:  Outpatient Encounter Medications as of 10/30/2017  Medication Sig Note  . alendronate (FOSAMAX) 70 MG tablet TAKE ONE TABLET BY MOUTH ONCE A WEEK IN THE MORNING WITH A FULL GLASS OF WATER ON AN EMPTY STOMACH. DO NOT LAY DOWN FOR 30 MINUTES. 07/19/2016: Received from: External Pharmacy  . atorvastatin (LIPITOR) 80 MG tablet Take 80 mg by mouth daily.   . calcitRIOL (ROCALTROL) 0.25 MCG capsule  04/26/2016: Received from: External Pharmacy  . cholecalciferol (VITAMIN D) 1000 UNITS tablet Take 1,000 Units by mouth daily.   [name] FERREX 150 150 MG capsule Take 150 mg by mouth 2 (two) times daily.   . furosemide (LASIX) 40 MG  tablet Take 40 mg by mouth.   . hydrALAZINE (APRESOLINE) 25 MG tablet TAKE THREE (3) TABLETS BY MOUTH TWICE DAILY 07/19/2016: Received from: External Pharmacy  . hydrALAZINE (APRESOLINE) 50 MG tablet Take 50 mg by mouth 2 (two) times daily.   [name] labetalol (NORMODYNE) 300 MG tablet Take 300 mg by mouth 2 (two) times daily.   . Omega-3 Fatty Acids (FISH OIL) 1000 MG CAPS Take 1 capsule by mouth daily.   . sodium bicarbonate 650 MG tablet Take 650 mg by mouth daily.   . tamsulosin (FLOMAX) 0.4 MG CAPS capsule  04/26/2016: Received from: External Pharmacy   Facility-Administered Encounter Medications as of 10/30/2017  Medication  . [START ON 10/31/2017] ferumoxytol (FERAHEME) 510 mg in sodium chloride 0.9 % 100 mL IVPB    ALLERGIES:  No Known Allergies   PHYSICAL EXAM:  ECOG Performance status: 1  Vitals:   10/30/17 1431  BP: 125/67  Pulse: 61  Resp: 18  Temp: 97.6 F (36.4 C)  SpO2: 100%   Filed Weights   10/30/17 1431  Weight: 212 lb (96.2 kg)    Physical Exam Deferred  LABORATORY DATA:  I have reviewed the labs as listed.  CBC    Component Value Date/Time   WBC 6.0 10/30/2017 1352   RBC 3.61 (L) 10/30/2017 1352   HGB 11.0 (L) 10/30/2017 1352   HCT 33.7 (L) 10/30/2017 1352   PLT 177 10/30/2017 1352   MCV 93.4 10/30/2017 1352   MCH 30.5 10/30/2017 1352   MCHC 32.6 10/30/2017 1352   RDW 12.7 10/30/2017 1352   LYMPHSABS 0.4 (L) 10/30/2017 1352   MONOABS 0.6 10/30/2017 1352   EOSABS 0.2 10/30/2017 1352   BASOSABS 0.0 10/30/2017 1352   CMP Latest Ref Rng & Units 10/30/2017 09/24/2017 06/27/2017  Glucose 65 - 99 mg/dL 94 139(H) 72  BUN 6 - 20 mg/dL 38(H) 42(H) 43(H)  Creatinine 0.61 - 1.24 mg/dL 3.43(H) 3.38(H) 3.66(H)  Sodium 135 - 145 mmol/L 137 137 136  Potassium 3.5 - 5.1 mmol/L 4.3 3.9 4.5  Chloride 101 - 111 mmol/L 105 104 105  CO2 22 - 32 mmol/L 23 24 25   Calcium 8.9 - 10.3 mg/dL 8.6(L) 8.7(L) 8.8(L)  Total Protein 6.5 - 8.1 g/dL 6.7 5.9(L) 6.3(L)  Total  Bilirubin 0.3 - 1.2 mg/dL 0.6 1.0 0.8  Alkaline Phos 38 - 126 U/L 78 51 51  AST 15 - 41 U/L 51(H) 36 63(H)  ALT 17 - 63 U/L 29 18 28        DIAGNOSTIC IMAGING:  I have independently reviewed the PET CT scan images and  agree with report.    ASSESSMENT & PLAN:   Malignant carcinoid tumor ([place]) 1.  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  Initially managed at [place] center.  Transferred care to any pain in October 2017. -Denies any carcinoid syndrome symptoms including flushing, diarrhea or wheezing.  Denies any abdominal pains. -Recent serum chromogranin was slightly elevated at 23.  Baseline is around 18. - I have independently reviewed gallium 68 scan which showed metastatic disease in the liver, central right mesenteric mass and multiple peritoneal deposits.  I have discussed this with the patient.  We do not have any previous scans to compare with to see if it has gotten worse.  He does not have any symptoms at this time.  Hence we decided to continue monthly Sandostatin injections.  I plan to repeat the scan in few months.  If there is any progression, adding temozolomide is an option.  He will come back in 3 months with repeat labs.  2.  Normocytic anemia: This is from a combination of iron deficiency and chronic kidney disease.  Ferritin in March has decreased to 56 from 74 prior to that.  Last Feraheme infusion was in July 2018.  Hence I have recommended 1 more infusion of Feraheme.  We will repeat this again in 3 months.  3.  Head and neck cancer: Status post surgery and radiation therapy in 2009.  He is in remission.  4.  Kidney cancer: Status post right nephrectomy in May 2010 at [facility].  5.  Prostate cancer: Status post seed implant by Dr. Tammi Klippel in 2016.  He follows up with his urologist.      Orders placed this encounter:  Orders Placed This Encounter  Procedures  . CBC  .  Comprehensive metabolic panel  . Ferritin  . Iron and TIBC  . Chromogranin A      Derek Jack, MD [place] [phone]

## 2017-10-30 NOTE — Assessment & Plan Note (Signed)
1.  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  Initially managed at [place] center.  Transferred care to any pain in October 2017. -Denies any carcinoid syndrome symptoms including flushing, diarrhea or wheezing.  Denies any abdominal pains. -Recent serum chromogranin was slightly elevated at 23.  Baseline is around 18. - I have independently reviewed gallium 68 scan which showed metastatic disease in the liver, central right mesenteric mass and multiple peritoneal deposits.  I have discussed this with the patient.  We do not have any previous scans to compare with to see if it has gotten worse.  He does not have any symptoms at this time.  Hence we decided to continue monthly Sandostatin injections.  I plan to repeat the scan in few months.  If there is any progression, adding temozolomide is an option.  He will come back in 3 months with repeat labs.  2.  Normocytic anemia: This is from a combination of iron deficiency and chronic kidney disease.  Ferritin in March has decreased to 56 from 74 prior to that.  Last Feraheme infusion was in July 2018.  Hence I have recommended 1 more infusion of Feraheme.  We will repeat this again in 3 months.  3.  Head and neck cancer: Status post surgery and radiation therapy in 2009.  He is in remission.  4.  Kidney cancer: Status post right nephrectomy in May 2010 at [facility].  5.  Prostate cancer: Status post seed implant by Dr. Tammi Klippel in 2016.  He follows up with his urologist.

## 2017-10-30 NOTE — Progress Notes (Signed)
Allen Davenport tolerated Sandostatin injection well without complaints or incident. VSS Pt discharged self ambulatory in satisfactory condition 

## 2017-10-30 NOTE — Patient Instructions (Signed)
[facility] at [facility]  Discharge Instructions:  You were seen by Dr. Katragadda today.   _______________________________________________________________  Thank you for choosing [facility] at [facility] to provide your oncology and hematology care.  To afford each patient quality time with our providers, please arrive at least 15 minutes before your scheduled appointment.  You need to re-schedule your appointment if you arrive 10 or more minutes late.  We strive to give you quality time with our providers, and arriving late affects you and other patients whose appointments are after yours.  Also, if you no show three or more times for appointments you may be dismissed from the clinic.  Again, thank you for choosing [facility] at [facility]. Our hope is that these requests will allow you access to exceptional care and in a timely manner. _______________________________________________________________  If you have questions after your visit, please contact our office at [phone] between the hours of 8:30 a.m. and 5:00 p.m. Voicemails left after 4:30 p.m. will not be returned until the following business day. _______________________________________________________________  For prescription refill requests, have your pharmacy contact our office. _______________________________________________________________  Recommendations made by the consultant and any test results will be sent to your referring physician. _______________________________________________________________ 

## 2017-10-31 LAB — CHROMOGRANIN A: Chromogranin A: 31 nmol/L — ABNORMAL HIGH (ref 0–5)

## 2017-11-01 DIAGNOSIS — N184 Chronic kidney disease, stage 4 (severe): Secondary | ICD-10-CM | POA: Diagnosis not present

## 2017-11-01 DIAGNOSIS — I10 Essential (primary) hypertension: Secondary | ICD-10-CM | POA: Diagnosis not present

## 2017-11-01 DIAGNOSIS — M818 Other osteoporosis without current pathological fracture: Secondary | ICD-10-CM | POA: Diagnosis not present

## 2017-11-01 DIAGNOSIS — E7849 Other hyperlipidemia: Secondary | ICD-10-CM | POA: Diagnosis not present

## 2017-11-02 DIAGNOSIS — I10 Essential (primary) hypertension: Secondary | ICD-10-CM | POA: Diagnosis not present

## 2017-11-02 DIAGNOSIS — N184 Chronic kidney disease, stage 4 (severe): Secondary | ICD-10-CM | POA: Diagnosis not present

## 2017-11-02 DIAGNOSIS — E7849 Other hyperlipidemia: Secondary | ICD-10-CM | POA: Diagnosis not present

## 2017-11-02 DIAGNOSIS — M818 Other osteoporosis without current pathological fracture: Secondary | ICD-10-CM | POA: Diagnosis not present

## 2017-11-08 ENCOUNTER — Encounter (HOSPITAL_COMMUNITY): Payer: Self-pay

## 2017-11-08 ENCOUNTER — Inpatient Hospital Stay (HOSPITAL_COMMUNITY): Payer: Medicare Other

## 2017-11-08 VITALS — BP 124/67 | HR 51 | Temp 97.5°F | Resp 18

## 2017-11-08 DIAGNOSIS — Z79899 Other long term (current) drug therapy: Secondary | ICD-10-CM | POA: Diagnosis not present

## 2017-11-08 DIAGNOSIS — D509 Iron deficiency anemia, unspecified: Secondary | ICD-10-CM | POA: Diagnosis not present

## 2017-11-08 DIAGNOSIS — D631 Anemia in chronic kidney disease: Secondary | ICD-10-CM | POA: Diagnosis not present

## 2017-11-08 DIAGNOSIS — E559 Vitamin D deficiency, unspecified: Secondary | ICD-10-CM | POA: Diagnosis not present

## 2017-11-08 DIAGNOSIS — R809 Proteinuria, unspecified: Secondary | ICD-10-CM | POA: Diagnosis not present

## 2017-11-08 DIAGNOSIS — N184 Chronic kidney disease, stage 4 (severe): Secondary | ICD-10-CM | POA: Diagnosis not present

## 2017-11-08 DIAGNOSIS — C7A00 Malignant carcinoid tumor of unspecified site: Secondary | ICD-10-CM

## 2017-11-08 MED ORDER — SODIUM CHLORIDE 0.9 % IV SOLN
INTRAVENOUS | Status: DC
  Administered 2017-11-08: 14:00:00 via INTRAVENOUS

## 2017-11-08 MED ORDER — SODIUM CHLORIDE 0.9 % IV SOLN
510.0000 mg | Freq: Once | INTRAVENOUS | Status: AC
  Administered 2017-11-08: 510 mg via INTRAVENOUS
  Filled 2017-11-08: qty 17

## 2017-11-08 NOTE — Patient Instructions (Signed)
[facility] at [facility] Discharge Instructions  Received Feraheme infusion today. Follow-up as scheduled. Call clinic for any questions or concerns   Thank you for choosing [facility] at [facility] to provide your oncology and hematology care.  To afford each patient quality time with our provider, please arrive at least 15 minutes before your scheduled appointment time.   If you have a lab appointment with the Cancer Center please come in thru the  Main Entrance and check in at the main information desk  You need to re-schedule your appointment should you arrive 10 or more minutes late.  We strive to give you quality time with our providers, and arriving late affects you and other patients whose appointments are after yours.  Also, if you no show three or more times for appointments you may be dismissed from the clinic at the providers discretion.     Again, thank you for choosing [facility].  Our hope is that these requests will decrease the amount of time that you wait before being seen by our physicians.       _____________________________________________________________  Should you have questions after your visit to [facility], please contact our office at [phone] between the hours of 8:30 a.m. and 4:30 p.m.  Voicemails left after 4:30 p.m. will not be returned until the following business day.  For prescription refill requests, have your pharmacy contact our office.       Resources For Cancer Patients and their Caregivers ? American Cancer Society: Can assist with transportation, wigs, general needs, runs Look Good Feel Better.        1-888-227-6333 ? Cancer Care: Provides financial assistance, online support groups, medication/co-pay assistance.  1-800-813-HOPE (4673) ? [facility] Assists [place] cancer patients and their families through emotional , educational and  financial support.  336-427-4357 ? [place] DSS Where to apply for food stamps, Medicaid and utility assistance. 336-342-1394 ? RCATS: Transportation to medical appointments. 336-347-2287 ? Social Security Administration: May apply for disability if have a Stage IV cancer. 336-342-7796 1-800-772-1213 ? [place] Aging, Disability and Transit Services: Assists with nutrition, care and transit needs. 336-349-2343  Cancer Center Support Programs:   > Cancer Support Group  2nd Tuesday of the month 1pm-2pm, Journey Room   > Creative Journey  3rd Tuesday of the month 1130am-1pm, Journey Room    

## 2017-11-08 NOTE — Progress Notes (Signed)
Allen Davenport tolerated Feraheme infusion well without complaints or incident. VSS after infusion completed. Pt discharged self ambulatory in satisfactory condition accompanied by his wife

## 2017-11-27 ENCOUNTER — Inpatient Hospital Stay (HOSPITAL_COMMUNITY): Payer: Medicare Other | Attending: Oncology

## 2017-11-27 ENCOUNTER — Encounter (HOSPITAL_COMMUNITY): Payer: Self-pay

## 2017-11-27 VITALS — BP 115/54 | HR 51 | Temp 98.2°F | Resp 18

## 2017-11-27 DIAGNOSIS — C7A00 Malignant carcinoid tumor of unspecified site: Secondary | ICD-10-CM | POA: Insufficient documentation

## 2017-11-27 DIAGNOSIS — C7B02 Secondary carcinoid tumors of liver: Secondary | ICD-10-CM | POA: Insufficient documentation

## 2017-11-27 MED ORDER — OCTREOTIDE ACETATE 30 MG IM KIT
30.0000 mg | PACK | Freq: Once | INTRAMUSCULAR | Status: AC
  Administered 2017-11-27: 30 mg via INTRAMUSCULAR

## 2017-11-27 NOTE — Progress Notes (Signed)
Allen Davenport tolerated Sandostatin injection well without complaints or incident. VSS Pt discharged self ambulatory in satisfactory condition 

## 2017-11-27 NOTE — Patient Instructions (Signed)
[place] at [facility] Discharge Instructions  Received Sandostatin injection today. Follow-up as scheduled. Call clinic for any questions or concerns   Thank you for choosing [place] at [facility] to provide your oncology and hematology care.  To afford each patient quality time with our provider, please arrive at least 15 minutes before your scheduled appointment time.   If you have a lab appointment with the [facility] please come in thru the  Main Entrance and check in at the main information desk  You need to re-schedule your appointment should you arrive 10 or more minutes late.  We strive to give you quality time with our providers, and arriving late affects you and other patients whose appointments are after yours.  Also, if you no show three or more times for appointments you may be dismissed from the clinic at the providers discretion.     Again, thank you for choosing [facility].  Our hope is that these requests will decrease the amount of time that you wait before being seen by our physicians.       _____________________________________________________________  Should you have questions after your visit to [facility], please contact our office at [phone] between the hours of 8:30 a.m. and 4:30 p.m.  Voicemails left after 4:30 p.m. will not be returned until the following business day.  For prescription refill requests, have your pharmacy contact our office.       Resources For Cancer Patients and their Caregivers ? American Cancer Society: Can assist with transportation, wigs, general needs, runs Look Good Feel Better.        678-004-2811 ? Cancer Care: Provides financial assistance, online support groups, medication/co-pay assistance.  1-800-813-HOPE 604-001-8109) ? [facility] Assists [place] cancer patients and their families through emotional , educational and  financial support.  443-202-2531 ? [place] DSS Where to apply for food stamps, Medicaid and utility assistance. 941-696-8242 ? RCATS: Transportation to medical appointments. (516) 812-3239 ? Social Security Administration: May apply for disability if have a Stage IV cancer. 859-744-7490 (423)037-7341 ? [employer], Disability and Transit Services: Assists with nutrition, care and transit needs. [place] Support Programs:   > Cancer Support Group  2nd Tuesday of the month 1pm-2pm, Journey Room   > Creative Journey  3rd Tuesday of the month 1130am-1pm, Journey Room

## 2017-11-29 DIAGNOSIS — Z8521 Personal history of malignant neoplasm of larynx: Secondary | ICD-10-CM | POA: Diagnosis not present

## 2017-11-29 DIAGNOSIS — R490 Dysphonia: Secondary | ICD-10-CM | POA: Diagnosis not present

## 2017-12-05 DIAGNOSIS — N184 Chronic kidney disease, stage 4 (severe): Secondary | ICD-10-CM | POA: Diagnosis not present

## 2017-12-05 DIAGNOSIS — Z79899 Other long term (current) drug therapy: Secondary | ICD-10-CM | POA: Diagnosis not present

## 2017-12-05 DIAGNOSIS — E559 Vitamin D deficiency, unspecified: Secondary | ICD-10-CM | POA: Diagnosis not present

## 2017-12-05 DIAGNOSIS — D509 Iron deficiency anemia, unspecified: Secondary | ICD-10-CM | POA: Diagnosis not present

## 2017-12-05 DIAGNOSIS — R809 Proteinuria, unspecified: Secondary | ICD-10-CM | POA: Diagnosis not present

## 2017-12-11 DIAGNOSIS — E876 Hypokalemia: Secondary | ICD-10-CM | POA: Diagnosis not present

## 2017-12-11 DIAGNOSIS — N184 Chronic kidney disease, stage 4 (severe): Secondary | ICD-10-CM | POA: Diagnosis not present

## 2017-12-11 DIAGNOSIS — R809 Proteinuria, unspecified: Secondary | ICD-10-CM | POA: Diagnosis not present

## 2017-12-11 DIAGNOSIS — D638 Anemia in other chronic diseases classified elsewhere: Secondary | ICD-10-CM | POA: Diagnosis not present

## 2017-12-11 DIAGNOSIS — E872 Acidosis: Secondary | ICD-10-CM | POA: Diagnosis not present

## 2017-12-11 DIAGNOSIS — I10 Essential (primary) hypertension: Secondary | ICD-10-CM | POA: Diagnosis not present

## 2017-12-14 DIAGNOSIS — I10 Essential (primary) hypertension: Secondary | ICD-10-CM | POA: Diagnosis not present

## 2017-12-14 DIAGNOSIS — E7849 Other hyperlipidemia: Secondary | ICD-10-CM | POA: Diagnosis not present

## 2017-12-14 DIAGNOSIS — M818 Other osteoporosis without current pathological fracture: Secondary | ICD-10-CM | POA: Diagnosis not present

## 2017-12-14 DIAGNOSIS — N184 Chronic kidney disease, stage 4 (severe): Secondary | ICD-10-CM | POA: Diagnosis not present

## 2017-12-25 ENCOUNTER — Encounter (HOSPITAL_COMMUNITY): Payer: Self-pay

## 2017-12-25 ENCOUNTER — Inpatient Hospital Stay (HOSPITAL_COMMUNITY): Payer: Medicare Other | Attending: Oncology

## 2017-12-25 VITALS — BP 119/65 | HR 66 | Temp 97.8°F | Resp 18

## 2017-12-25 DIAGNOSIS — C7A00 Malignant carcinoid tumor of unspecified site: Secondary | ICD-10-CM

## 2017-12-25 DIAGNOSIS — C7B02 Secondary carcinoid tumors of liver: Secondary | ICD-10-CM | POA: Diagnosis not present

## 2017-12-25 MED ORDER — OCTREOTIDE ACETATE 30 MG IM KIT
30.0000 mg | PACK | Freq: Once | INTRAMUSCULAR | Status: AC
  Administered 2017-12-25: 30 mg via INTRAMUSCULAR

## 2017-12-25 NOTE — Patient Instructions (Signed)
[place] at [facility] Discharge Instructions  Received Sandostatin injection today. Follow up as scheduled. Call clinic for any questions or concerns   Thank you for choosing [place] at [facility] to provide your oncology and hematology care.  To afford each patient quality time with our provider, please arrive at least 15 minutes before your scheduled appointment time.   If you have a lab appointment with the [facility] please come in thru the  Main Entrance and check in at the main information desk  You need to re-schedule your appointment should you arrive 10 or more minutes late.  We strive to give you quality time with our providers, and arriving late affects you and other patients whose appointments are after yours.  Also, if you no show three or more times for appointments you may be dismissed from the clinic at the providers discretion.     Again, thank you for choosing [facility].  Our hope is that these requests will decrease the amount of time that you wait before being seen by our physicians.       _____________________________________________________________  Should you have questions after your visit to [facility], please contact our office at [phone] between the hours of 8:30 a.m. and 4:30 p.m.  Voicemails left after 4:30 p.m. will not be returned until the following business day.  For prescription refill requests, have your pharmacy contact our office.       Resources For Cancer Patients and their Caregivers ? American Cancer Society: Can assist with transportation, wigs, general needs, runs Look Good Feel Better.        (204)851-9990 ? Cancer Care: Provides financial assistance, online support groups, medication/co-pay assistance.  1-800-813-HOPE 508-080-8964) ? [facility] Assists [place] cancer patients and their families through emotional , educational and  financial support.  226-210-2708 ? [place] DSS Where to apply for food stamps, Medicaid and utility assistance. (928) 318-7970 ? RCATS: Transportation to medical appointments. 325-878-2764 ? Social Security Administration: May apply for disability if have a Stage IV cancer. (732)441-3923 640-511-6187 ? [employer], Disability and Transit Services: Assists with nutrition, care and transit needs. [place] Support Programs:   > Cancer Support Group  2nd Tuesday of the month 1pm-2pm, Journey Room   > Creative Journey  3rd Tuesday of the month 1130am-1pm, Journey Room

## 2017-12-25 NOTE — Progress Notes (Signed)
Allen Davenport tolerated Sandostatin injection well without complaints or incident. VSS Pt discharged self ambulatory in satisfactory condition 

## 2017-12-27 DIAGNOSIS — E1140 Type 2 diabetes mellitus with diabetic neuropathy, unspecified: Secondary | ICD-10-CM | POA: Diagnosis not present

## 2017-12-27 DIAGNOSIS — E1151 Type 2 diabetes mellitus with diabetic peripheral angiopathy without gangrene: Secondary | ICD-10-CM | POA: Diagnosis not present

## 2018-01-10 DIAGNOSIS — E7849 Other hyperlipidemia: Secondary | ICD-10-CM | POA: Diagnosis not present

## 2018-01-10 DIAGNOSIS — N184 Chronic kidney disease, stage 4 (severe): Secondary | ICD-10-CM | POA: Diagnosis not present

## 2018-01-10 DIAGNOSIS — M818 Other osteoporosis without current pathological fracture: Secondary | ICD-10-CM | POA: Diagnosis not present

## 2018-01-10 DIAGNOSIS — I10 Essential (primary) hypertension: Secondary | ICD-10-CM | POA: Diagnosis not present

## 2018-01-22 ENCOUNTER — Inpatient Hospital Stay (HOSPITAL_BASED_OUTPATIENT_CLINIC_OR_DEPARTMENT_OTHER): Payer: Medicare Other | Admitting: Hematology

## 2018-01-22 ENCOUNTER — Inpatient Hospital Stay (HOSPITAL_COMMUNITY): Payer: Medicare Other

## 2018-01-22 ENCOUNTER — Other Ambulatory Visit: Payer: Self-pay

## 2018-01-22 ENCOUNTER — Inpatient Hospital Stay (HOSPITAL_COMMUNITY): Payer: Medicare Other | Attending: Hematology

## 2018-01-22 ENCOUNTER — Encounter (HOSPITAL_COMMUNITY): Payer: Self-pay | Admitting: Hematology

## 2018-01-22 VITALS — BP 115/60 | HR 51 | Temp 97.8°F | Resp 18 | Wt 210.7 lb

## 2018-01-22 DIAGNOSIS — D509 Iron deficiency anemia, unspecified: Secondary | ICD-10-CM | POA: Diagnosis not present

## 2018-01-22 DIAGNOSIS — N189 Chronic kidney disease, unspecified: Secondary | ICD-10-CM

## 2018-01-22 DIAGNOSIS — C7B02 Secondary carcinoid tumors of liver: Secondary | ICD-10-CM

## 2018-01-22 DIAGNOSIS — C7A00 Malignant carcinoid tumor of unspecified site: Secondary | ICD-10-CM | POA: Diagnosis not present

## 2018-01-22 DIAGNOSIS — D631 Anemia in chronic kidney disease: Secondary | ICD-10-CM | POA: Insufficient documentation

## 2018-01-22 LAB — CBC WITH DIFFERENTIAL/PLATELET
Basophils Absolute: 0 10*3/uL (ref 0.0–0.1)
Basophils Relative: 1 %
EOS ABS: 0.3 10*3/uL (ref 0.0–0.7)
EOS PCT: 6 %
HCT: 36.2 % — ABNORMAL LOW (ref 39.0–52.0)
Hemoglobin: 11.9 g/dL — ABNORMAL LOW (ref 13.0–17.0)
LYMPHS ABS: 0.9 10*3/uL (ref 0.7–4.0)
Lymphocytes Relative: 18 %
MCH: 31.2 pg (ref 26.0–34.0)
MCHC: 32.9 g/dL (ref 30.0–36.0)
MCV: 94.8 fL (ref 78.0–100.0)
MONOS PCT: 7 %
Monocytes Absolute: 0.3 10*3/uL (ref 0.1–1.0)
Neutro Abs: 3.5 10*3/uL (ref 1.7–7.7)
Neutrophils Relative %: 68 %
PLATELETS: 124 10*3/uL — AB (ref 150–400)
RBC: 3.82 MIL/uL — ABNORMAL LOW (ref 4.22–5.81)
RDW: 13.4 % (ref 11.5–15.5)
WBC: 5.1 10*3/uL (ref 4.0–10.5)

## 2018-01-22 LAB — COMPREHENSIVE METABOLIC PANEL
ALT: 22 U/L (ref 0–44)
ANION GAP: 6 (ref 5–15)
AST: 43 U/L — ABNORMAL HIGH (ref 15–41)
Albumin: 3.5 g/dL (ref 3.5–5.0)
Alkaline Phosphatase: 55 U/L (ref 38–126)
BUN: 42 mg/dL — ABNORMAL HIGH (ref 8–23)
CHLORIDE: 107 mmol/L (ref 98–111)
CO2: 25 mmol/L (ref 22–32)
Calcium: 9 mg/dL (ref 8.9–10.3)
Creatinine, Ser: 3.92 mg/dL — ABNORMAL HIGH (ref 0.61–1.24)
GFR calc non Af Amer: 14 mL/min — ABNORMAL LOW (ref >60)
GFR, EST AFRICAN AMERICAN: 16 mL/min — AB (ref >60)
Glucose, Bld: 102 mg/dL — ABNORMAL HIGH (ref 70–99)
Potassium: 4.2 mmol/L (ref 3.5–5.1)
SODIUM: 138 mmol/L (ref 135–145)
TOTAL PROTEIN: 6 g/dL — AB (ref 6.5–8.1)
Total Bilirubin: 0.7 mg/dL (ref 0.3–1.2)

## 2018-01-22 MED ORDER — OCTREOTIDE ACETATE 30 MG IM KIT
30.0000 mg | PACK | Freq: Once | INTRAMUSCULAR | Status: AC
  Administered 2018-01-22: 30 mg via INTRAMUSCULAR

## 2018-01-22 NOTE — Progress Notes (Signed)
Allen Davenport, [place] [id number]   CLINIC:  Medical Oncology/Hematology  PCP:  [name], MD [place] [phone]   REASON FOR VISIT:  Follow-up for metastatic carcinoid tumor.  CURRENT THERAPY: Sandostatin monthly.   INTERVAL HISTORY:  Allen Davenport 77 y.o. male returns for metastatic carcinoid tumor.  He denies any diarrhea, abdominal pains, flushing or wheezing.  Denies any recent hospitalizations or infections.  Appetite has been good.  Energy levels are 75%.    REVIEW OF SYSTEMS:  Review of Systems  Constitutional: Positive for appetite change and fatigue.  All other systems reviewed and are negative.    PAST MEDICAL/SURGICAL HISTORY:  Past Medical History:  Diagnosis Date  . Anemia   . Cancer [facility])    right renal . Prostate- Radiation treatment  . CHF (congestive heart failure) ([place])   . Chronic kidney disease    stage 4  . Edema   . Heart murmur    "nothing to worry about"  . Hyperlipidemia   . Hypertension   . Malignant carcinoid tumor ([place]) 01/03/2016   Past Surgical History:  Procedure Laterality Date  . AV FISTULA PLACEMENT Left 10/20/2014   Procedure: Left Arm ARTERIOVENOUS (AV) FISTULA CREATION;  Surgeon: Elam Dutch, MD;  Location: [facility];  Service: Vascular;  Laterality: Left;  . NEPHRECTOMY Right 2010     SOCIAL HISTORY:  Social History   Socioeconomic History  . Marital status: Married    Spouse name: Not on file  . Number of children: Not on file  . Years of education: Not on file  . Highest education level: Not on file  Occupational History  . Not on file  Social Needs  . Financial resource strain: Not on file  . Food insecurity:    Worry: Not on file    Inability: Not on file  . Transportation needs:    Medical: Not on file    Non-medical: Not on file  Tobacco Use  . Smoking status: Never Smoker  . Smokeless tobacco: Never Used  Substance and Sexual  Activity  . Alcohol use: No    Alcohol/week: 0.0 oz  . Drug use: No  . Sexual activity: Not on file  Lifestyle  . Physical activity:    Days per week: Not on file    Minutes per session: Not on file  . Stress: Not on file  Relationships  . Social connections:    Talks on phone: Not on file    Gets together: Not on file    Attends religious service: Not on file    Active member of club or organization: Not on file    Attends meetings of clubs or organizations: Not on file    Relationship status: Not on file  . Intimate partner violence:    Fear of current or ex partner: Not on file    Emotionally abused: Not on file    Physically abused: Not on file    Forced sexual activity: Not on file  Other Topics Concern  . Not on file  Social History Narrative  . Not on file    FAMILY HISTORY:  Family History  Problem Relation Age of Onset  . Cancer Mother   . Hypertension Father   . Cancer Sister     CURRENT MEDICATIONS:  Outpatient Encounter Medications as of 01/22/2018  Medication Sig Note  . alendronate (FOSAMAX) 70 MG tablet TAKE ONE TABLET  BY MOUTH ONCE A WEEK IN THE MORNING WITH A FULL GLASS OF WATER ON AN EMPTY STOMACH. DO NOT LAY DOWN FOR 30 MINUTES. 07/19/2016: Received from: External Pharmacy  . atorvastatin (LIPITOR) 80 MG tablet Take 80 mg by mouth daily.   . calcitRIOL (ROCALTROL) 0.25 MCG capsule  04/26/2016: Received from: External Pharmacy  . cholecalciferol (VITAMIN D) 1000 UNITS tablet Take 1,000 Units by mouth daily.   [name] FERREX 150 150 MG capsule Take 150 mg by mouth 2 (two) times daily.   . furosemide (LASIX) 40 MG tablet Take 40 mg by mouth.   . hydrALAZINE (APRESOLINE) 25 MG tablet TAKE THREE (3) TABLETS BY MOUTH TWICE DAILY 07/19/2016: Received from: External Pharmacy  . hydrALAZINE (APRESOLINE) 50 MG tablet Take 50 mg by mouth 2 (two) times daily.   [name] labetalol (NORMODYNE) 300 MG tablet Take 300 mg by mouth 2 (two) times daily.   . Omega-3 Fatty Acids (FISH  OIL) 1000 MG CAPS Take 1 capsule by mouth daily.   . sodium bicarbonate 650 MG tablet Take 650 mg by mouth daily.   . tamsulosin (FLOMAX) 0.4 MG CAPS capsule  04/26/2016: Received from: External Pharmacy  . [EXPIRED] octreotide (SANDOSTATIN LAR) IM injection 30 mg     No facility-administered encounter medications on file as of 01/22/2018.     ALLERGIES:  No Known Allergies   PHYSICAL EXAM:  ECOG Performance status: 1  Vitals:   01/22/18 1453  BP: 115/60  Pulse: (!) 51  Resp: 18  Temp: 97.8 F (36.6 C)  SpO2: 99%   Filed Weights   01/22/18 1453  Weight: 210 lb 11.2 oz (95.6 kg)    Physical Exam Chest: Bilateral clear to auscultation. Abdomen: No palpable masses.  Examination is nontender.  LABORATORY DATA:  I have reviewed the labs as listed.  CBC    Component Value Date/Time   WBC 5.1 01/22/2018 1421   RBC 3.82 (L) 01/22/2018 1421   HGB 11.9 (L) 01/22/2018 1421   HCT 36.2 (L) 01/22/2018 1421   PLT 124 (L) 01/22/2018 1421   MCV 94.8 01/22/2018 1421   MCH 31.2 01/22/2018 1421   MCHC 32.9 01/22/2018 1421   RDW 13.4 01/22/2018 1421   LYMPHSABS 0.9 01/22/2018 1421   MONOABS 0.3 01/22/2018 1421   EOSABS 0.3 01/22/2018 1421   BASOSABS 0.0 01/22/2018 1421   CMP Latest Ref Rng & Units 01/22/2018 10/30/2017 09/24/2017  Glucose 70 - 99 mg/dL 102(H) 94 139(H)  BUN 8 - 23 mg/dL 42(H) 38(H) 42(H)  Creatinine 0.61 - 1.24 mg/dL 3.92(H) 3.43(H) 3.38(H)  Sodium 135 - 145 mmol/L 138 137 137  Potassium 3.5 - 5.1 mmol/L 4.2 4.3 3.9  Chloride 98 - 111 mmol/L 107 105 104  CO2 22 - 32 mmol/L 25 23 24   Calcium 8.9 - 10.3 mg/dL 9.0 8.6(L) 8.7(L)  Total Protein 6.5 - 8.1 g/dL 6.0(L) 6.7 5.9(L)  Total Bilirubin 0.3 - 1.2 mg/dL 0.7 0.6 1.0  Alkaline Phos 38 - 126 U/L 55 78 51  AST 15 - 41 U/L 43(H) 51(H) 36  ALT 0 - 44 U/L 22 29 18        ASSESSMENT & PLAN:   Malignant carcinoid tumor ([place]) 1.  Metastatic carcinoid tumor to the liver and peritoneum: - He has been on  Sandostatin for over 5 years.  Initially managed at [place] [facility].  Transferred care to any pain in October 2017. -Denies any carcinoid syndrome symptoms including flushing, diarrhea or wheezing.  Denies any abdominal  pains. -Recent serum chromogranin was slightly elevated at 23.  Baseline is around 18. - Gallium-68 scan on 10/23/2017 shows metastatic disease in the liver, central right mesenteric mass and multiple peritoneal deposits. - He denies any diarrhea, flushing or wheezing.  No abdominal pains were reported.  No abdominal masses felt.  Since he is asymptomatic, we will continue Sandostatin monthly injections.  I plan to see him back in 3 months with repeat gadolinium scan.  2.  Normocytic anemia: This is from a combination of iron deficiency and chronic kidney disease.  We have given Feraheme infusion on 11/08/2017.  His hemoglobin improved to 11.9.  3.  Head and neck cancer: Status post surgery and radiation therapy in 2009.  He is in remission.  4.  Kidney cancer: Status post right nephrectomy in May 2010 at [facility].  5.  Prostate cancer: Status post seed implant by Dr. Tammi Klippel in 2016.  He follows up with his urologist.      Orders placed this encounter:  Orders Placed This Encounter  Procedures  . NM PET (NETSPOT GA 52 DOTATATE) SKULL BASE TO MID THIGH  . CBC with Differential/Platelet  . Comprehensive metabolic panel  . Serotonin serum  . Chromogranin A      Derek Jack, MD [place] [phone]

## 2018-01-22 NOTE — Assessment & Plan Note (Signed)
1.  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  Initially managed at [facility].  Transferred care to any pain in October 2017. -Denies any carcinoid syndrome symptoms including flushing, diarrhea or wheezing.  Denies any abdominal pains. -Recent serum chromogranin was slightly elevated at 23.  Baseline is around 18. - Gallium-68 scan on 10/23/2017 shows metastatic disease in the liver, central right mesenteric mass and multiple peritoneal deposits. - He denies any diarrhea, flushing or wheezing.  No abdominal pains were reported.  No abdominal masses felt.  Since he is asymptomatic, we will continue Sandostatin monthly injections.  I plan to see him back in 3 months with repeat gadolinium scan.  2.  Normocytic anemia: This is from a combination of iron deficiency and chronic kidney disease.  We have given Feraheme infusion on 11/08/2017.  His hemoglobin improved to 11.9.  3.  Head and neck cancer: Status post surgery and radiation therapy in 2009.  He is in remission.  4.  Kidney cancer: Status post right nephrectomy in May 2010 at [facility].  5.  Prostate cancer: Status post seed implant by Dr. Tammi Klippel in 2016.  He follows up with his urologist.

## 2018-01-22 NOTE — Progress Notes (Signed)
sandostatin given today per orders. See MAR for details.

## 2018-01-23 LAB — CHROMOGRANIN A: Chromogranin A: 34 nmol/L — ABNORMAL HIGH (ref 0–5)

## 2018-01-24 LAB — SEROTONIN SERUM: SEROTONIN, SERUM: 668 ng/mL — AB (ref 21–321)

## 2018-01-31 DIAGNOSIS — N184 Chronic kidney disease, stage 4 (severe): Secondary | ICD-10-CM | POA: Diagnosis not present

## 2018-01-31 DIAGNOSIS — E7849 Other hyperlipidemia: Secondary | ICD-10-CM | POA: Diagnosis not present

## 2018-01-31 DIAGNOSIS — M818 Other osteoporosis without current pathological fracture: Secondary | ICD-10-CM | POA: Diagnosis not present

## 2018-01-31 DIAGNOSIS — I10 Essential (primary) hypertension: Secondary | ICD-10-CM | POA: Diagnosis not present

## 2018-02-05 DIAGNOSIS — Z0000 Encounter for general adult medical examination without abnormal findings: Secondary | ICD-10-CM | POA: Diagnosis not present

## 2018-02-05 DIAGNOSIS — I10 Essential (primary) hypertension: Secondary | ICD-10-CM | POA: Diagnosis not present

## 2018-02-05 DIAGNOSIS — M818 Other osteoporosis without current pathological fracture: Secondary | ICD-10-CM | POA: Diagnosis not present

## 2018-02-05 DIAGNOSIS — N184 Chronic kidney disease, stage 4 (severe): Secondary | ICD-10-CM | POA: Diagnosis not present

## 2018-02-05 DIAGNOSIS — E7849 Other hyperlipidemia: Secondary | ICD-10-CM | POA: Diagnosis not present

## 2018-02-05 DIAGNOSIS — Z125 Encounter for screening for malignant neoplasm of prostate: Secondary | ICD-10-CM | POA: Diagnosis not present

## 2018-02-05 DIAGNOSIS — Z1389 Encounter for screening for other disorder: Secondary | ICD-10-CM | POA: Diagnosis not present

## 2018-02-19 ENCOUNTER — Inpatient Hospital Stay (HOSPITAL_COMMUNITY): Payer: Medicare Other | Attending: Oncology

## 2018-02-19 VITALS — BP 130/72 | HR 51 | Temp 97.9°F | Resp 18

## 2018-02-19 DIAGNOSIS — C7A00 Malignant carcinoid tumor of unspecified site: Secondary | ICD-10-CM | POA: Diagnosis not present

## 2018-02-19 DIAGNOSIS — C7B02 Secondary carcinoid tumors of liver: Secondary | ICD-10-CM | POA: Insufficient documentation

## 2018-02-19 MED ORDER — OCTREOTIDE ACETATE 30 MG IM KIT
30.0000 mg | PACK | Freq: Once | INTRAMUSCULAR | Status: AC
Start: 2018-02-19 — End: 2018-02-19
  Administered 2018-02-19: 30 mg via INTRAMUSCULAR

## 2018-02-19 NOTE — Progress Notes (Signed)
Allen Davenport tolerated Sandostatin injection well without incident or complaint. VSS. Discharged self ambulatory in satisfactory condition.

## 2018-02-19 NOTE — Patient Instructions (Signed)
[place] at [facility]  Discharge Instructions:  You received a Sandostatin injection today. Follow up as scheduled. Call clinic for any questions or concerns. _______________________________________________________________  Thank you for choosing [place] at [facility] to provide your oncology and hematology care.  To afford each patient quality time with our providers, please arrive at least 15 minutes before your scheduled appointment.  You need to re-schedule your appointment if you arrive 10 or more minutes late.  We strive to give you quality time with our providers, and arriving late affects you and other patients whose appointments are after yours.  Also, if you no show three or more times for appointments you may be dismissed from the clinic.  Again, thank you for choosing [place] at [place] hope is that these requests will allow you access to exceptional care and in a timely manner. _______________________________________________________________  If you have questions after your visit, please contact our office at [phone] between the hours of 8:30 a.m. and 5:00 p.m. Voicemails left after 4:30 p.m. will not be returned until the following business day. _______________________________________________________________  For prescription refill requests, have your pharmacy contact our office. _______________________________________________________________  Recommendations made by the consultant and any test results will be sent to your referring physician. _______________________________________________________________

## 2018-03-08 DIAGNOSIS — N184 Chronic kidney disease, stage 4 (severe): Secondary | ICD-10-CM | POA: Diagnosis not present

## 2018-03-08 DIAGNOSIS — M818 Other osteoporosis without current pathological fracture: Secondary | ICD-10-CM | POA: Diagnosis not present

## 2018-03-08 DIAGNOSIS — E7849 Other hyperlipidemia: Secondary | ICD-10-CM | POA: Diagnosis not present

## 2018-03-08 DIAGNOSIS — I10 Essential (primary) hypertension: Secondary | ICD-10-CM | POA: Diagnosis not present

## 2018-03-15 DIAGNOSIS — N184 Chronic kidney disease, stage 4 (severe): Secondary | ICD-10-CM | POA: Diagnosis not present

## 2018-03-15 DIAGNOSIS — R809 Proteinuria, unspecified: Secondary | ICD-10-CM | POA: Diagnosis not present

## 2018-03-15 DIAGNOSIS — E559 Vitamin D deficiency, unspecified: Secondary | ICD-10-CM | POA: Diagnosis not present

## 2018-03-15 DIAGNOSIS — D509 Iron deficiency anemia, unspecified: Secondary | ICD-10-CM | POA: Diagnosis not present

## 2018-03-15 DIAGNOSIS — Z79899 Other long term (current) drug therapy: Secondary | ICD-10-CM | POA: Diagnosis not present

## 2018-03-19 ENCOUNTER — Inpatient Hospital Stay (HOSPITAL_COMMUNITY): Payer: Medicare Other | Attending: Oncology

## 2018-03-19 ENCOUNTER — Encounter (HOSPITAL_COMMUNITY): Payer: Self-pay

## 2018-03-19 VITALS — BP 118/63 | HR 55 | Temp 97.8°F | Resp 18

## 2018-03-19 DIAGNOSIS — C7B02 Secondary carcinoid tumors of liver: Secondary | ICD-10-CM | POA: Diagnosis not present

## 2018-03-19 DIAGNOSIS — C7A00 Malignant carcinoid tumor of unspecified site: Secondary | ICD-10-CM | POA: Diagnosis not present

## 2018-03-19 MED ORDER — OCTREOTIDE ACETATE 30 MG IM KIT
30.0000 mg | PACK | Freq: Once | INTRAMUSCULAR | Status: AC
  Administered 2018-03-19: 30 mg via INTRAMUSCULAR

## 2018-03-19 NOTE — Patient Instructions (Signed)
[facility] at [facility] Discharge Instructions  Received Sandostatin injection today. Follow-up as scheduled. Call clinic for any questions or concerns   Thank you for choosing [facility] at [facility] to provide your oncology and hematology care.  To afford each patient quality time with our provider, please arrive at least 15 minutes before your scheduled appointment time.   If you have a lab appointment with the Cancer Center please come in thru the  Main Entrance and check in at the main information desk  You need to re-schedule your appointment should you arrive 10 or more minutes late.  We strive to give you quality time with our providers, and arriving late affects you and other patients whose appointments are after yours.  Also, if you no show three or more times for appointments you may be dismissed from the clinic at the providers discretion.     Again, thank you for choosing [facility].  Our hope is that these requests will decrease the amount of time that you wait before being seen by our physicians.       _____________________________________________________________  Should you have questions after your visit to [facility], please contact our office at [phone] between the hours of 8:00 a.m. and 4:30 p.m.  Voicemails left after 4:00 p.m. will not be returned until the following business day.  For prescription refill requests, have your pharmacy contact our office and allow 72 hours.    Cancer Center Support Programs:   > Cancer Support Group  2nd Tuesday of the month 1pm-2pm, Journey Room   

## 2018-03-19 NOTE — Progress Notes (Signed)
Allen Davenport tolerated Sandostatin injection well without complaints or incident. VSS Pt discharged self ambulatory in satisfactory condition

## 2018-03-21 DIAGNOSIS — E1151 Type 2 diabetes mellitus with diabetic peripheral angiopathy without gangrene: Secondary | ICD-10-CM | POA: Diagnosis not present

## 2018-03-21 DIAGNOSIS — E1140 Type 2 diabetes mellitus with diabetic neuropathy, unspecified: Secondary | ICD-10-CM | POA: Diagnosis not present

## 2018-03-26 DIAGNOSIS — M9080 Osteopathy in diseases classified elsewhere, unspecified site: Secondary | ICD-10-CM | POA: Diagnosis not present

## 2018-03-26 DIAGNOSIS — D631 Anemia in chronic kidney disease: Secondary | ICD-10-CM | POA: Diagnosis not present

## 2018-03-26 DIAGNOSIS — E889 Metabolic disorder, unspecified: Secondary | ICD-10-CM | POA: Diagnosis not present

## 2018-03-26 DIAGNOSIS — I10 Essential (primary) hypertension: Secondary | ICD-10-CM | POA: Diagnosis not present

## 2018-03-26 DIAGNOSIS — N184 Chronic kidney disease, stage 4 (severe): Secondary | ICD-10-CM | POA: Diagnosis not present

## 2018-04-08 DIAGNOSIS — I10 Essential (primary) hypertension: Secondary | ICD-10-CM | POA: Diagnosis not present

## 2018-04-08 DIAGNOSIS — M818 Other osteoporosis without current pathological fracture: Secondary | ICD-10-CM | POA: Diagnosis not present

## 2018-04-08 DIAGNOSIS — N184 Chronic kidney disease, stage 4 (severe): Secondary | ICD-10-CM | POA: Diagnosis not present

## 2018-04-08 DIAGNOSIS — E7849 Other hyperlipidemia: Secondary | ICD-10-CM | POA: Diagnosis not present

## 2018-04-22 ENCOUNTER — Encounter (HOSPITAL_COMMUNITY)
Admission: RE | Admit: 2018-04-22 | Discharge: 2018-04-22 | Disposition: A | Discharge status: Home / Self Care | Payer: Medicare Other | Source: Ambulatory Visit | Attending: Nurse Practitioner | Admitting: Nurse Practitioner

## 2018-04-22 DIAGNOSIS — C787 Secondary malignant neoplasm of liver and intrahepatic bile duct: Secondary | ICD-10-CM | POA: Diagnosis not present

## 2018-04-22 DIAGNOSIS — C7A1 Malignant poorly differentiated neuroendocrine tumors: Secondary | ICD-10-CM | POA: Diagnosis not present

## 2018-04-22 DIAGNOSIS — C7A00 Malignant carcinoid tumor of unspecified site: Secondary | ICD-10-CM | POA: Insufficient documentation

## 2018-04-22 MED ORDER — GALLIUM GA 68 DOTATATE IV KIT
4.6400 | PACK | Freq: Once | INTRAVENOUS | Status: AC
  Administered 2018-04-22: 4.64 via INTRAVENOUS

## 2018-04-25 ENCOUNTER — Inpatient Hospital Stay (HOSPITAL_COMMUNITY): Payer: Medicare Other

## 2018-04-25 ENCOUNTER — Inpatient Hospital Stay (HOSPITAL_COMMUNITY): Payer: Medicare Other | Attending: Oncology | Admitting: Hematology

## 2018-04-25 ENCOUNTER — Telehealth (HOSPITAL_COMMUNITY): Payer: Self-pay | Admitting: Pharmacist

## 2018-04-25 ENCOUNTER — Encounter (HOSPITAL_COMMUNITY): Payer: Self-pay | Admitting: Hematology

## 2018-04-25 VITALS — BP 117/64 | HR 56 | Temp 97.8°F | Resp 20 | Wt 215.0 lb

## 2018-04-25 DIAGNOSIS — D631 Anemia in chronic kidney disease: Secondary | ICD-10-CM | POA: Diagnosis not present

## 2018-04-25 DIAGNOSIS — C7A00 Malignant carcinoid tumor of unspecified site: Secondary | ICD-10-CM

## 2018-04-25 DIAGNOSIS — C7B02 Secondary carcinoid tumors of liver: Secondary | ICD-10-CM | POA: Diagnosis not present

## 2018-04-25 DIAGNOSIS — Z23 Encounter for immunization: Secondary | ICD-10-CM | POA: Insufficient documentation

## 2018-04-25 DIAGNOSIS — Z8589 Personal history of malignant neoplasm of other organs and systems: Secondary | ICD-10-CM

## 2018-04-25 DIAGNOSIS — D509 Iron deficiency anemia, unspecified: Secondary | ICD-10-CM

## 2018-04-25 DIAGNOSIS — C7A019 Malignant carcinoid tumor of the small intestine, unspecified portion: Secondary | ICD-10-CM

## 2018-04-25 DIAGNOSIS — Z8546 Personal history of malignant neoplasm of prostate: Secondary | ICD-10-CM

## 2018-04-25 DIAGNOSIS — Z85528 Personal history of other malignant neoplasm of kidney: Secondary | ICD-10-CM

## 2018-04-25 DIAGNOSIS — Z79899 Other long term (current) drug therapy: Secondary | ICD-10-CM | POA: Insufficient documentation

## 2018-04-25 DIAGNOSIS — N189 Chronic kidney disease, unspecified: Secondary | ICD-10-CM

## 2018-04-25 LAB — COMPREHENSIVE METABOLIC PANEL
ALT: 28 U/L (ref 0–44)
AST: 51 U/L — ABNORMAL HIGH (ref 15–41)
Albumin: 3.6 g/dL (ref 3.5–5.0)
Alkaline Phosphatase: 51 U/L (ref 38–126)
Anion gap: 7 (ref 5–15)
BUN: 44 mg/dL — ABNORMAL HIGH (ref 8–23)
CHLORIDE: 107 mmol/L (ref 98–111)
CO2: 25 mmol/L (ref 22–32)
CREATININE: 3.52 mg/dL — AB (ref 0.61–1.24)
Calcium: 9 mg/dL (ref 8.9–10.3)
GFR, EST AFRICAN AMERICAN: 18 mL/min — AB (ref >60)
GFR, EST NON AFRICAN AMERICAN: 15 mL/min — AB (ref >60)
Glucose, Bld: 90 mg/dL (ref 70–99)
POTASSIUM: 4.3 mmol/L (ref 3.5–5.1)
Sodium: 139 mmol/L (ref 135–145)
Total Bilirubin: 0.7 mg/dL (ref 0.3–1.2)
Total Protein: 6.3 g/dL — ABNORMAL LOW (ref 6.5–8.1)

## 2018-04-25 LAB — CBC WITH DIFFERENTIAL/PLATELET
Abs Immature Granulocytes: 0.01 10*3/uL (ref 0.00–0.07)
BASOS PCT: 1 %
Basophils Absolute: 0 10*3/uL (ref 0.0–0.1)
EOS ABS: 0.2 10*3/uL (ref 0.0–0.5)
EOS PCT: 6 %
HCT: 36.3 % — ABNORMAL LOW (ref 39.0–52.0)
Hemoglobin: 11.7 g/dL — ABNORMAL LOW (ref 13.0–17.0)
Immature Granulocytes: 0 %
Lymphocytes Relative: 10 %
Lymphs Abs: 0.4 10*3/uL — ABNORMAL LOW (ref 0.7–4.0)
MCH: 30.9 pg (ref 26.0–34.0)
MCHC: 32.2 g/dL (ref 30.0–36.0)
MCV: 95.8 fL (ref 80.0–100.0)
MONO ABS: 0.5 10*3/uL (ref 0.1–1.0)
MONOS PCT: 12 %
Neutro Abs: 3.1 10*3/uL (ref 1.7–7.7)
Neutrophils Relative %: 71 %
PLATELETS: 115 10*3/uL — AB (ref 150–400)
RBC: 3.79 MIL/uL — ABNORMAL LOW (ref 4.22–5.81)
RDW: 12.8 % (ref 11.5–15.5)
WBC: 4.3 10*3/uL (ref 4.0–10.5)
nRBC: 0 % (ref 0.0–0.2)

## 2018-04-25 MED ORDER — OCTREOTIDE ACETATE 30 MG IM KIT
PACK | INTRAMUSCULAR | Status: AC
  Filled 2018-04-25: qty 1

## 2018-04-25 MED ORDER — INFLUENZA VAC SPLIT HIGH-DOSE 0.5 ML IM SUSY
PREFILLED_SYRINGE | INTRAMUSCULAR | Status: AC
  Filled 2018-04-25: qty 0.5

## 2018-04-25 MED ORDER — INFLUENZA VAC SPLIT HIGH-DOSE 0.5 ML IM SUSY
0.5000 mL | PREFILLED_SYRINGE | INTRAMUSCULAR | Status: AC
  Administered 2018-04-25: 0.5 mL via INTRAMUSCULAR

## 2018-04-25 MED ORDER — EVEROLIMUS 5 MG PO TABS: 5.0000 mg | ORAL_TABLET | Freq: Every day | ORAL | 0 refills | Status: DC

## 2018-04-25 MED ORDER — OCTREOTIDE ACETATE 30 MG IM KIT
30.0000 mg | PACK | Freq: Once | INTRAMUSCULAR | Status: AC
  Administered 2018-04-25: 30 mg via INTRAMUSCULAR

## 2018-04-25 NOTE — Progress Notes (Signed)
Allen Davenport, [place] [address]   CLINIC:  Medical Oncology/Hematology  PCP:  [name], MD [place] [phone]   REASON FOR VISIT: Follow-up for metastatic carcinoid tumor  CURRENT THERAPY: Sandostatin monthly   INTERVAL HISTORY:  Allen Davenport 77 y.o. male returns for routine follow-up for metastatic carcinoid tumor. He is tolerating his Sandostatin injections well. He has diarrhea maybe once a month. He denies any abdominal pain. He denies any wheezing or flushing. His appetite and energy level is 75% and he is maintaining his weight. He lives at home and performs all his own ADLs.     REVIEW OF SYSTEMS:  Review of Systems  Gastrointestinal: Positive for diarrhea (once a month).  All other systems reviewed and are negative.    PAST MEDICAL/SURGICAL HISTORY:  Past Medical History:  Diagnosis Date  . Anemia   . Cancer [facility])    right renal . Prostate- Radiation treatment  . CHF (congestive heart failure) ([place])   . Chronic kidney disease    stage 4  . Edema   . Heart murmur    "nothing to worry about"  . Hyperlipidemia   . Hypertension   . Malignant carcinoid tumor ([place]) 01/03/2016   Past Surgical History:  Procedure Laterality Date  . AV FISTULA PLACEMENT Left 10/20/2014   Procedure: Left Arm ARTERIOVENOUS (AV) FISTULA CREATION;  Surgeon: Elam Dutch, MD;  Location: [place];  Service: Vascular;  Laterality: Left;  . NEPHRECTOMY Right 2010     SOCIAL HISTORY:  Social History   Socioeconomic History  . Marital status: Married    Spouse name: Not on file  . Number of children: Not on file  . Years of education: Not on file  . Highest education level: Not on file  Occupational History  . Not on file  Social Needs  . Financial resource strain: Not on file  . Food insecurity:    Worry: Not on file    Inability: Not on file  . Transportation needs:    Medical: Not on file   Non-medical: Not on file  Tobacco Use  . Smoking status: Never Smoker  . Smokeless tobacco: Never Used  Substance and Sexual Activity  . Alcohol use: No    Alcohol/week: 0.0 standard drinks  . Drug use: No  . Sexual activity: Not on file  Lifestyle  . Physical activity:    Days per week: Not on file    Minutes per session: Not on file  . Stress: Not on file  Relationships  . Social connections:    Talks on phone: Not on file    Gets together: Not on file    Attends religious service: Not on file    Active member of club or organization: Not on file    Attends meetings of clubs or organizations: Not on file    Relationship status: Not on file  . Intimate partner violence:    Fear of current or ex partner: Not on file    Emotionally abused: Not on file    Physically abused: Not on file    Forced sexual activity: Not on file  Other Topics Concern  . Not on file  Social History Narrative  . Not on file    FAMILY HISTORY:  Family History  Problem Relation Age of Onset  . Cancer Mother   . Hypertension Father   . Cancer Sister  CURRENT MEDICATIONS:  Outpatient Encounter Medications as of 04/25/2018  Medication Sig Note  . alendronate (FOSAMAX) 70 MG tablet TAKE ONE TABLET BY MOUTH ONCE A WEEK IN THE MORNING WITH A FULL GLASS OF WATER ON AN EMPTY STOMACH. DO NOT LAY DOWN FOR 30 MINUTES. 07/19/2016: Received from: External Pharmacy  . amLODipine (NORVASC) 10 MG tablet Take by mouth.   [name] atorvastatin (LIPITOR) 80 MG tablet Take 80 mg by mouth daily.   [name] atorvastatin (LIPITOR) 80 MG tablet    . bisoprolol-hydrochlorothiazide (ZIAC) 10-6.25 MG tablet    . calcitRIOL (ROCALTROL) 0.25 MCG capsule  04/26/2016: Received from: External Pharmacy  . carvedilol (COREG) 3.125 MG tablet Take by mouth.   . cholecalciferol (VITAMIN D) 1000 UNITS tablet Take 1,000 Units by mouth daily.   [name] everolimus (AFINITOR) 5 MG tablet Take 1 tablet (5 mg total) by mouth daily.   [name] FERREX 150 150 MG  capsule Take 150 mg by mouth 2 (two) times daily.   . furosemide (LASIX) 40 MG tablet Take 40 mg by mouth.   . hydrALAZINE (APRESOLINE) 25 MG tablet TAKE THREE (3) TABLETS BY MOUTH TWICE DAILY 07/19/2016: Received from: External Pharmacy  . hydrALAZINE (APRESOLINE) 50 MG tablet Take 50 mg by mouth 2 (two) times daily.   [name] labetalol (NORMODYNE) 300 MG tablet Take 300 mg by mouth 2 (two) times daily.   [name] octreotide (SANDOSTATIN LAR DEPOT) 30 MG injection Inject into the muscle.   . Omega-3 Fatty Acids (FISH OIL) 1000 MG CAPS Take 1 capsule by mouth daily.   . potassium chloride SA (K-DUR,KLOR-CON) 20 MEQ tablet    . sodium bicarbonate 650 MG tablet Take 650 mg by mouth daily.   . SUNItinib (SUTENT) 50 MG capsule    . tamsulosin (FLOMAX) 0.4 MG CAPS capsule  04/26/2016: Received from: External Pharmacy   No facility-administered encounter medications on file as of 04/25/2018.     ALLERGIES:  No Known Allergies   PHYSICAL EXAM:  ECOG Performance status: 1  Vitals:   04/25/18 1051  BP: 117/64  Pulse: (!) 56  Resp: 20  Temp: 97.8 F (36.6 C)  SpO2: 100%   Filed Weights   04/25/18 1051  Weight: 215 lb (97.5 kg)    Physical Exam  Constitutional: He is oriented to person, place, and time. He appears well-developed and well-nourished.  Musculoskeletal: Normal range of motion.  Neurological: He is alert and oriented to person, place, and time.  Skin: Skin is warm and dry.  Psychiatric: He has a normal mood and affect. His behavior is normal. Judgment and thought content normal.  Abdomen: No palpable masses or hepatosplenomegaly.  Nontender to touch. Lymphadenopathy: No palpable adenopathy in the neck, axilla or inguinal region.   LABORATORY DATA:  I have reviewed the labs as listed.  CBC    Component Value Date/Time   WBC 4.3 04/25/2018 0948   RBC 3.79 (L) 04/25/2018 0948   HGB 11.7 (L) 04/25/2018 0948   HCT 36.3 (L) 04/25/2018 0948   PLT 115 (L) 04/25/2018 0948   MCV 95.8  04/25/2018 0948   MCH 30.9 04/25/2018 0948   MCHC 32.2 04/25/2018 0948   RDW 12.8 04/25/2018 0948   LYMPHSABS 0.4 (L) 04/25/2018 0948   MONOABS 0.5 04/25/2018 0948   EOSABS 0.2 04/25/2018 0948   BASOSABS 0.0 04/25/2018 0948   CMP Latest Ref Rng & Units 04/25/2018 01/22/2018 10/30/2017  Glucose 70 - 99 mg/dL 90 102(H) 94  BUN 8 - 23 mg/dL 44(H)  42(H) 38(H)  Creatinine 0.61 - 1.24 mg/dL 3.52(H) 3.92(H) 3.43(H)  Sodium 135 - 145 mmol/L 139 138 137  Potassium 3.5 - 5.1 mmol/L 4.3 4.2 4.3  Chloride 98 - 111 mmol/L 107 107 105  CO2 22 - 32 mmol/L 25 25 23   Calcium 8.9 - 10.3 mg/dL 9.0 9.0 8.6(L)  Total Protein 6.5 - 8.1 g/dL 6.3(L) 6.0(L) 6.7  Total Bilirubin 0.3 - 1.2 mg/dL 0.7 0.7 0.6  Alkaline Phos 38 - 126 U/L 51 55 78  AST 15 - 41 U/L 51(H) 43(H) 51(H)  ALT 0 - 44 U/L 28 22 29        DIAGNOSTIC IMAGING:  I have independently reviewed images of the PET CT scan and discussed with patient.     ASSESSMENT & PLAN:   Malignant carcinoid tumor ([place]) 1.  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  Initially managed at [place] center.  Transferred care to any pain in October 2017. -Denies any carcinoid syndrome symptoms including flushing, diarrhea or wheezing.  Denies any abdominal pains. - Gallium-68 scan on 10/23/2017 shows metastatic disease in the liver, central right mesenteric mass and multiple peritoneal deposits. - He does not have any abdominal pains or symptoms of carcinoid syndrome. -I have reviewed his blood work.  Recent serotonin was 668 and chromogranin was 34.  They have been consistently going up. -I have reviewed the results of the Ga 68 dotatate PET CT scan on 04/22/2018 which showed multiple foci of intense radiotracer activity consistent with metastatic well-differentiated carcinoid tumor.  Metastatic sites include central mesentery, multiple peritoneal implants, liver metastasis.  Several metastatic  lesions have measured higher activity.  Attentional mild increase in size of central mesenteric mass.  Lesion within the right ventricle along the interventricular septum is most consistent with right intraventricular meta stasis. -I have recommended a 2D echocardiogram for further evaluation of the right ventricle lesion. - As there is progression on the scans and labs, I have recommended adding everolimus to Sandostatin.  We will start him at 5 mg daily for a month and then increase it to 10 mg daily.  We talked about the side effects including but not limited to stomatitis, diarrhea, fatigue, rash, peripheral edema, cytopenias among others.  We will send prescription to the specialty pharmacy. - I will see him back in 2 weeks after starting everolimus.  2.  Normocytic anemia: This is from a combination of iron deficiency and CKD.  Last Feraheme infusion was on 11/08/2017.  Hemoglobin today is 11.7.  We plan to repeat ferritin and iron panel at next visit.   3.  Head and neck cancer: Status post surgery and radiation therapy in 2009.  He is in remission.  4.  Kidney cancer: Status post right nephrectomy in May 2010 at [facility].  5.  Prostate cancer: Status post seed implant by Dr. Tammi Klippel in 2016.  He follows up with his urologist.      Orders placed this encounter:  Orders Placed This Encounter  Procedures  . CBC with Differential/Platelet  . Comprehensive metabolic panel  . Ferritin  . Iron and TIBC  . Chromogranin A  . ECHOCARDIOGRAM COMPLETE      Derek Jack, MD [place] [phone]

## 2018-04-25 NOTE — Telephone Encounter (Signed)
Oral Oncology Pharmacist Encounter  Received new prescription for Afinitor (everolimus) for the treatment of metastatic carcinoid tumor in conjunction with sandostatin injections, planned duration until disease progression or unacceptable drug toxicity.  CMP from 04/25/18 assessed, no relevant lab abnormalities. Prescription dose and frequency assessed. Dexamethasone mouth wash will be sent in to go along with the everolimus prescription.   Current medication list in Epic reviewed, one relevant DDIs with everolimus identified: - Carvedilol may increase the serum concentration of everolimus. No baseline adjustment needed, monitor patient for everolimus intolerance and the need to dose reduce.   Prescription has been e-scribed to the [facility] for benefits analysis and approval.  Oral Oncology Clinic will continue to follow for insurance authorization, copayment issues, initial counseling and start date.  Darl Pikes, PharmD, BCPS, [facility] Hematology/Oncology Clinical Pharmacist [facility] Oral [place] [facility] [phone]  04/25/2018 3:23 PM

## 2018-04-25 NOTE — Patient Instructions (Signed)
[place] at [facility] Discharge Instructions  Follow up in 3 week with ECHO and abs prior.    Thank you for choosing [place] at [facility] to provide your oncology and hematology care.  To afford each patient quality time with our provider, please arrive at least 15 minutes before your scheduled appointment time.   If you have a lab appointment with the [facility] please come in thru the  Main Entrance and check in at the main information desk  You need to re-schedule your appointment should you arrive 10 or more minutes late.  We strive to give you quality time with our providers, and arriving late affects you and other patients whose appointments are after yours.  Also, if you no show three or more times for appointments you may be dismissed from the clinic at the providers discretion.     Again, thank you for choosing [facility].  Our hope is that these requests will decrease the amount of time that you wait before being seen by our physicians.       _____________________________________________________________  Should you have questions after your visit to [facility], please contact our office at [phone] between the hours of 8:00 a.m. and 4:30 p.m.  Voicemails left after 4:00 p.m. will not be returned until the following business day.  For prescription refill requests, have your pharmacy contact our office and allow 72 hours.    Cancer Center Support Programs:   > Cancer Support Group  2nd Tuesday of the month 1pm-2pm, Journey Room

## 2018-04-25 NOTE — Progress Notes (Signed)
Allen Davenport presents today for injection per the provider's orders.  Sandostatin administration without incident; see MAR for injection details.  Patient tolerated procedure well and without incident.  No questions or complaints noted at this time.  High dose influenza vaccine also administered and tolerated well and without incident. Discharged ambulatory.

## 2018-04-25 NOTE — Assessment & Plan Note (Signed)
1.  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  Initially managed at [place] center.  Transferred care to any pain in October 2017. -Denies any carcinoid syndrome symptoms including flushing, diarrhea or wheezing.  Denies any abdominal pains. - Gallium-68 scan on 10/23/2017 shows metastatic disease in the liver, central right mesenteric mass and multiple peritoneal deposits. - He does not have any abdominal pains or symptoms of carcinoid syndrome. -I have reviewed his blood work.  Recent serotonin was 668 and chromogranin was 34.  They have been consistently going up. -I have reviewed the results of the Ga 68 dotatate PET CT scan on 04/22/2018 which showed multiple foci of intense radiotracer activity consistent with metastatic well-differentiated carcinoid tumor.  Metastatic sites include central mesentery, multiple peritoneal implants, liver metastasis.  Several metastatic lesions have measured higher activity.  Attentional mild increase in size of central mesenteric mass.  Lesion within the right ventricle along the interventricular septum is most consistent with right intraventricular meta stasis. -I have recommended a 2D echocardiogram for further evaluation of the right ventricle lesion. - As there is progression on the scans and labs, I have recommended adding everolimus to Sandostatin.  We will start him at 5 mg daily for a month and then increase it to 10 mg daily.  We talked about the side effects including but not limited to stomatitis, diarrhea, fatigue, rash, peripheral edema, cytopenias among others.  We will send prescription to the specialty pharmacy. - I will see him back in 2 weeks after starting everolimus.  2.  Normocytic anemia: This is from a combination of iron deficiency and CKD.  Last Feraheme infusion was on 11/08/2017.  Hemoglobin today is 11.7.  We plan to repeat ferritin and iron panel at next visit.   3.  Head and  neck cancer: Status post surgery and radiation therapy in 2009.  He is in remission.  4.  Kidney cancer: Status post right nephrectomy in May 2010 at [facility].  5.  Prostate cancer: Status post seed implant by Dr. Tammi Klippel in 2016.  He follows up with his urologist.

## 2018-04-26 LAB — CHROMOGRANIN A: CHROMOGRANIN A: 46 nmol/L — AB (ref 0–5)

## 2018-04-27 LAB — SEROTONIN SERUM: Serotonin, Serum: 650 ng/mL — ABNORMAL HIGH (ref 21–321)

## 2018-04-29 ENCOUNTER — Telehealth (HOSPITAL_COMMUNITY): Payer: Self-pay | Admitting: Pharmacist

## 2018-04-29 NOTE — Telephone Encounter (Signed)
Oral Oncology Pharmacist Encounter   Received notification from [facility] that prior authorization for Afinitor is required.   PA submitted on [employer]MM [place] [id number]  Status is pending   Oral Oncology Clinic will continue to follow.   Darl Pikes, PharmD, BCPS, [facility] Hematology/Oncology Clinical Pharmacist [facility] Oral [place] [facility] [phone]  04/29/2018 11:17 AM

## 2018-04-29 NOTE — Telephone Encounter (Signed)
Oral Oncology Pharmacist Encounter   Prior Authorization for Afinitor has been approved.     PA reference #: [id number]  Effective dates: 07/15/17 through 07/16/18  Copay $2325.22, currently no copay assistance available, will apply for manufacturer assistance   Oral Oncology Clinic will continue to follow.   Darl Pikes, PharmD, BCPS. [facility] Hematology/Oncology Clinical Pharmacist [facility] Oral Chemotherapy Navigation Clinic [phone]  04/29/2018 11:46 AM

## 2018-05-03 ENCOUNTER — Ambulatory Visit (HOSPITAL_COMMUNITY)
Admission: RE | Admit: 2018-05-03 | Discharge: 2018-05-03 | Disposition: A | Discharge status: Home / Self Care | Payer: Medicare Other | Source: Ambulatory Visit | Attending: Nurse Practitioner | Admitting: Nurse Practitioner

## 2018-05-03 DIAGNOSIS — I119 Hypertensive heart disease without heart failure: Secondary | ICD-10-CM | POA: Insufficient documentation

## 2018-05-03 DIAGNOSIS — C7A019 Malignant carcinoid tumor of the small intestine, unspecified portion: Secondary | ICD-10-CM | POA: Diagnosis not present

## 2018-05-03 DIAGNOSIS — E7800 Pure hypercholesterolemia, unspecified: Secondary | ICD-10-CM | POA: Insufficient documentation

## 2018-05-03 NOTE — Progress Notes (Signed)
*  PRELIMINARY RESULTS* Echocardiogram 2D Echocardiogram has been performed.  Allen Davenport 05/03/2018, 10:59 AM

## 2018-05-07 ENCOUNTER — Ambulatory Visit (INDEPENDENT_AMBULATORY_CARE_PROVIDER_SITE_OTHER): Payer: Medicare Other | Admitting: Urology

## 2018-05-07 ENCOUNTER — Telehealth (HOSPITAL_COMMUNITY): Payer: Self-pay | Admitting: Pharmacy Technician

## 2018-05-07 DIAGNOSIS — Z8546 Personal history of malignant neoplasm of prostate: Secondary | ICD-10-CM

## 2018-05-07 DIAGNOSIS — C61 Malignant neoplasm of prostate: Secondary | ICD-10-CM | POA: Diagnosis not present

## 2018-05-07 NOTE — Telephone Encounter (Signed)
Oral Oncology Patient Advocate Encounter  Patient came by [facility] to complete an application for [employer] Patient Assistance [employer] (NPAF) in an effort to reduce the patient's out of pocket expense for Afinitor to $0.    Application completed and faxed to [phone].   NPAF phone number for follow up is [phone].   This encounter will be updated until final determination.   [place] Patient [place] Phone [phone] Fax [phone] 05/07/2018 2:02 PM

## 2018-05-08 ENCOUNTER — Telehealth (HOSPITAL_COMMUNITY): Payer: Self-pay | Admitting: Pharmacist

## 2018-05-08 DIAGNOSIS — C7A00 Malignant carcinoid tumor of unspecified site: Secondary | ICD-10-CM

## 2018-05-08 MED ORDER — DEXAMETHASONE 0.5 MG/5ML PO SOLN: ORAL | 4 refills | Status: DC

## 2018-05-08 NOTE — Telephone Encounter (Signed)
Oral Chemotherapy Pharmacist Encounter  Received a fax from [employer] stating that the patient has been approved for a 14 day supply of Afinitor to get him started while he awaits assistance approval. The manufacturer's pharmacy will be reaching out to set-up shipment. Spoke with patient and asked him to call the office to notify them when he has medication in hand and starts taking the Afinitor so his follow-up appt moved if needed.    Patient Education I spoke with patient for overview of new oral chemotherapy medication: Afinitor (everolimus) for the treatment of metastatic carcinoid tumor in conjunction with sandostatin injections, planned duration until disease progression or unacceptable drug toxicity.   Pt is doing well. Counseled patient on administration, dosing, side effects, monitoring, drug-food interactions, safe handling, storage, and disposal. Patient will take 1 tablet (5 mg total) by mouth daily.  Additional he will use a dexamethasone mouthwash to help prevent mouth sores. He will swish 72mL by mouth four times daily for 2 minutes then spit.  Side effects include but not limited to: diarrhea, N/V, mouth sores, fatigue, rash/itchy skin, decreased wbc/hgb/plt, HA.    Reviewed with patient importance of keeping a medication schedule and plan for any missed doses.  Mr. Ormond voiced understanding and appreciation. All questions answered. Medication handout placed in the mail.   Provided patient with Oral [place] [facility] phone number. Patient knows to call the office with questions or concerns. Oral Chemotherapy Navigation Clinic will continue to follow.  Darl Pikes, PharmD, BCPS, [facility] Hematology/Oncology Clinical Pharmacist [facility] Oral [place] [facility] [phone]  05/08/2018 10:31 AM

## 2018-05-13 DIAGNOSIS — E7849 Other hyperlipidemia: Secondary | ICD-10-CM | POA: Diagnosis not present

## 2018-05-13 DIAGNOSIS — I10 Essential (primary) hypertension: Secondary | ICD-10-CM | POA: Diagnosis not present

## 2018-05-13 DIAGNOSIS — M818 Other osteoporosis without current pathological fracture: Secondary | ICD-10-CM | POA: Diagnosis not present

## 2018-05-13 DIAGNOSIS — N184 Chronic kidney disease, stage 4 (severe): Secondary | ICD-10-CM | POA: Diagnosis not present

## 2018-05-15 NOTE — Telephone Encounter (Signed)
Oral Oncology Patient Advocate Encounter  Received notification from [employer] Patient Assistance foundation that patient has been successfully enrolled into their program to receive Afinitor from the manufacturer at $0 out of pocket until 07/16/18.    I called and spoke with patient.  He knows we will have to re-apply.   Patient knows to call the office with questions or concerns.   Oral Oncology Clinic will continue to follow.  [place] Patient [place] Phone [phone] Fax [phone] 05/15/2018 3:02 PM

## 2018-05-20 ENCOUNTER — Inpatient Hospital Stay (HOSPITAL_COMMUNITY): Payer: Medicare Other | Attending: Hematology

## 2018-05-20 DIAGNOSIS — Z79899 Other long term (current) drug therapy: Secondary | ICD-10-CM | POA: Insufficient documentation

## 2018-05-20 DIAGNOSIS — N189 Chronic kidney disease, unspecified: Secondary | ICD-10-CM | POA: Insufficient documentation

## 2018-05-20 DIAGNOSIS — D631 Anemia in chronic kidney disease: Secondary | ICD-10-CM | POA: Diagnosis not present

## 2018-05-20 DIAGNOSIS — C7A00 Malignant carcinoid tumor of unspecified site: Secondary | ICD-10-CM | POA: Diagnosis not present

## 2018-05-20 DIAGNOSIS — C7B02 Secondary carcinoid tumors of liver: Secondary | ICD-10-CM | POA: Insufficient documentation

## 2018-05-20 DIAGNOSIS — C7A019 Malignant carcinoid tumor of the small intestine, unspecified portion: Secondary | ICD-10-CM

## 2018-05-20 LAB — CBC WITH DIFFERENTIAL/PLATELET
ABS IMMATURE GRANULOCYTES: 0.01 10*3/uL (ref 0.00–0.07)
Basophils Absolute: 0 10*3/uL (ref 0.0–0.1)
Basophils Relative: 1 %
Eosinophils Absolute: 0.3 10*3/uL (ref 0.0–0.5)
Eosinophils Relative: 5 %
HEMATOCRIT: 39 % (ref 39.0–52.0)
HEMOGLOBIN: 12.5 g/dL — AB (ref 13.0–17.0)
IMMATURE GRANULOCYTES: 0 %
LYMPHS ABS: 0.6 10*3/uL — AB (ref 0.7–4.0)
LYMPHS PCT: 10 %
MCH: 31 pg (ref 26.0–34.0)
MCHC: 32.1 g/dL (ref 30.0–36.0)
MCV: 96.8 fL (ref 80.0–100.0)
MONOS PCT: 11 %
Monocytes Absolute: 0.6 10*3/uL (ref 0.1–1.0)
NEUTROS ABS: 4 10*3/uL (ref 1.7–7.7)
NRBC: 0 % (ref 0.0–0.2)
Neutrophils Relative %: 73 %
Platelets: 122 10*3/uL — ABNORMAL LOW (ref 150–400)
RBC: 4.03 MIL/uL — ABNORMAL LOW (ref 4.22–5.81)
RDW: 12.8 % (ref 11.5–15.5)
WBC: 5.5 10*3/uL (ref 4.0–10.5)

## 2018-05-20 LAB — FERRITIN: FERRITIN: 146 ng/mL (ref 24–336)

## 2018-05-20 LAB — COMPREHENSIVE METABOLIC PANEL
ALBUMIN: 3.7 g/dL (ref 3.5–5.0)
ALK PHOS: 53 U/L (ref 38–126)
ALT: 28 U/L (ref 0–44)
AST: 56 U/L — AB (ref 15–41)
Anion gap: 8 (ref 5–15)
BILIRUBIN TOTAL: 0.9 mg/dL (ref 0.3–1.2)
BUN: 53 mg/dL — AB (ref 8–23)
CALCIUM: 8.7 mg/dL — AB (ref 8.9–10.3)
CO2: 26 mmol/L (ref 22–32)
CREATININE: 3.68 mg/dL — AB (ref 0.61–1.24)
Chloride: 103 mmol/L (ref 98–111)
GFR calc Af Amer: 17 mL/min — ABNORMAL LOW (ref >60)
GFR, EST NON AFRICAN AMERICAN: 15 mL/min — AB (ref >60)
GLUCOSE: 96 mg/dL (ref 70–99)
Potassium: 4.3 mmol/L (ref 3.5–5.1)
Sodium: 137 mmol/L (ref 135–145)
TOTAL PROTEIN: 6.4 g/dL — AB (ref 6.5–8.1)

## 2018-05-20 LAB — IRON AND TIBC
Iron: 69 ug/dL (ref 45–182)
Saturation Ratios: 28 % (ref 17.9–39.5)
TIBC: 250 ug/dL (ref 250–450)
UIBC: 181 ug/dL

## 2018-05-21 DIAGNOSIS — D509 Iron deficiency anemia, unspecified: Secondary | ICD-10-CM | POA: Diagnosis not present

## 2018-05-21 DIAGNOSIS — Z79899 Other long term (current) drug therapy: Secondary | ICD-10-CM | POA: Diagnosis not present

## 2018-05-21 DIAGNOSIS — R809 Proteinuria, unspecified: Secondary | ICD-10-CM | POA: Diagnosis not present

## 2018-05-21 DIAGNOSIS — N184 Chronic kidney disease, stage 4 (severe): Secondary | ICD-10-CM | POA: Diagnosis not present

## 2018-05-21 DIAGNOSIS — E559 Vitamin D deficiency, unspecified: Secondary | ICD-10-CM | POA: Diagnosis not present

## 2018-05-22 LAB — CHROMOGRANIN A: CHROMOGRANIN A: 38 nmol/L — AB (ref 0–5)

## 2018-05-27 ENCOUNTER — Inpatient Hospital Stay (HOSPITAL_COMMUNITY): Payer: Medicare Other

## 2018-05-27 ENCOUNTER — Other Ambulatory Visit: Payer: Self-pay

## 2018-05-27 ENCOUNTER — Inpatient Hospital Stay (HOSPITAL_BASED_OUTPATIENT_CLINIC_OR_DEPARTMENT_OTHER): Payer: Medicare Other | Admitting: Hematology

## 2018-05-27 ENCOUNTER — Encounter (HOSPITAL_COMMUNITY): Payer: Self-pay | Admitting: Hematology

## 2018-05-27 VITALS — BP 136/65 | HR 61 | Temp 97.6°F | Resp 18 | Wt 218.0 lb

## 2018-05-27 DIAGNOSIS — C7B02 Secondary carcinoid tumors of liver: Secondary | ICD-10-CM | POA: Diagnosis not present

## 2018-05-27 DIAGNOSIS — D509 Iron deficiency anemia, unspecified: Secondary | ICD-10-CM

## 2018-05-27 DIAGNOSIS — Z79899 Other long term (current) drug therapy: Secondary | ICD-10-CM | POA: Diagnosis not present

## 2018-05-27 DIAGNOSIS — C7A00 Malignant carcinoid tumor of unspecified site: Secondary | ICD-10-CM

## 2018-05-27 DIAGNOSIS — N189 Chronic kidney disease, unspecified: Secondary | ICD-10-CM

## 2018-05-27 DIAGNOSIS — D631 Anemia in chronic kidney disease: Secondary | ICD-10-CM

## 2018-05-27 MED ORDER — OCTREOTIDE ACETATE 30 MG IM KIT
30.0000 mg | PACK | Freq: Once | INTRAMUSCULAR | Status: AC
  Administered 2018-05-27: 30 mg via INTRAMUSCULAR

## 2018-05-27 NOTE — Patient Instructions (Signed)
[facility] at [facility]  Discharge Instructions:   _______________________________________________________________  Thank you for choosing [facility] at [facility] to provide your oncology and hematology care.  To afford each patient quality time with our providers, please arrive at least 15 minutes before your scheduled appointment.  You need to re-schedule your appointment if you arrive 10 or more minutes late.  We strive to give you quality time with our providers, and arriving late affects you and other patients whose appointments are after yours.  Also, if you no show three or more times for appointments you may be dismissed from the clinic.  Again, thank you for choosing  Cancer Center at [facility]. Our hope is that these requests will allow you access to exceptional care and in a timely manner. _______________________________________________________________  If you have questions after your visit, please contact our office at [phone] between the hours of 8:30 a.m. and 5:00 p.m. Voicemails left after 4:30 p.m. will not be returned until the following business day. _______________________________________________________________  For prescription refill requests, have your pharmacy contact our office. _______________________________________________________________  Recommendations made by the consultant and any test results will be sent to your referring physician. _______________________________________________________________ 

## 2018-05-27 NOTE — Progress Notes (Signed)
[place], [place] [address]   CLINIC:  Medical Oncology/Hematology  PCP:  [name], MD [place] [id number] [phone]   REASON FOR VISIT: Follow-up for metastatic carcinoid tumor  CURRENT THERAPY: Sandostatin monthly   INTERVAL HISTORY:  Allen Davenport 77 y.o. male returns for follow-up of metastatic carcinoid tumor.  He denies any wheezing or flushing or diarrhea.  He denies any abdominal pains.  He started taking everolimus on 05/17/2018.  So far he has not seen any side effects from it.  Energy level is 75%.  Appetite is 75%.  Denies any fevers or infections.   REVIEW OF SYSTEMS:  Review of Systems  Gastrointestinal: Negative for diarrhea (once a month).  All other systems reviewed and are negative.    PAST MEDICAL/SURGICAL HISTORY:  Past Medical History:  Diagnosis Date  . Anemia   . Cancer [facility])    right renal . Prostate- Radiation treatment  . CHF (congestive heart failure) ([place])   . Chronic kidney disease    stage 4  . Edema   . Heart murmur    "nothing to worry about"  . Hyperlipidemia   . Hypertension   . Malignant carcinoid tumor ([place]) 01/03/2016   Past Surgical History:  Procedure Laterality Date  . AV FISTULA PLACEMENT Left 10/20/2014   Procedure: Left Arm ARTERIOVENOUS (AV) FISTULA CREATION;  Surgeon: Elam Dutch, MD;  Location: [facility];  Service: Vascular;  Laterality: Left;  . NEPHRECTOMY Right 2010     SOCIAL HISTORY:  Social History   Socioeconomic History  . Marital status: Married    Spouse name: Not on file  . Number of children: Not on file  . Years of education: Not on file  . Highest education level: Not on file  Occupational History  . Not on file  Social Needs  . Financial resource strain: Not on file  . Food insecurity:    Worry: Not on file    Inability: Not on file  . Transportation needs:    Medical: Not on file    Non-medical: Not on file  Tobacco Use  .  Smoking status: Never Smoker  . Smokeless tobacco: Never Used  Substance and Sexual Activity  . Alcohol use: No    Alcohol/week: 0.0 standard drinks  . Drug use: No  . Sexual activity: Not on file  Lifestyle  . Physical activity:    Days per week: Not on file    Minutes per session: Not on file  . Stress: Not on file  Relationships  . Social connections:    Talks on phone: Not on file    Gets together: Not on file    Attends religious service: Not on file    Active member of club or organization: Not on file    Attends meetings of clubs or organizations: Not on file    Relationship status: Not on file  . Intimate partner violence:    Fear of current or ex partner: Not on file    Emotionally abused: Not on file    Physically abused: Not on file    Forced sexual activity: Not on file  Other Topics Concern  . Not on file  Social History Narrative  . Not on file    FAMILY HISTORY:  Family History  Problem Relation Age of Onset  . Cancer Mother   . Hypertension Father   . Cancer Sister  CURRENT MEDICATIONS:  Outpatient Encounter Medications as of 05/27/2018  Medication Sig Note  . alendronate (FOSAMAX) 70 MG tablet TAKE ONE TABLET BY MOUTH ONCE A WEEK IN THE MORNING WITH A FULL GLASS OF WATER ON AN EMPTY STOMACH. DO NOT LAY DOWN FOR 30 MINUTES. 07/19/2016: Received from: External Pharmacy  . amLODipine (NORVASC) 10 MG tablet Take by mouth.   [name] atorvastatin (LIPITOR) 80 MG tablet    . bisoprolol-hydrochlorothiazide (ZIAC) 10-6.25 MG tablet    . calcitRIOL (ROCALTROL) 0.25 MCG capsule  04/26/2016: Received from: External Pharmacy  . carvedilol (COREG) 3.125 MG tablet Take by mouth.   . cholecalciferol (VITAMIN D) 1000 UNITS tablet Take 1,000 Units by mouth daily.   [name] dexamethasone (DECADRON) 0.5 MG/5ML solution Swish 74mL by mouth four times daily for 2 minutes then spit.   [name] everolimus (AFINITOR) 5 MG tablet Take 1 tablet (5 mg total) by mouth daily.   [name] FERREX 150 150  MG capsule Take 150 mg by mouth 2 (two) times daily.   . furosemide (LASIX) 40 MG tablet Take 40 mg by mouth.   . hydrALAZINE (APRESOLINE) 25 MG tablet TAKE THREE (3) TABLETS BY MOUTH TWICE DAILY 07/19/2016: Received from: External Pharmacy  . labetalol (NORMODYNE) 300 MG tablet Take 300 mg by mouth 2 (two) times daily.   [name] octreotide (SANDOSTATIN LAR DEPOT) 30 MG injection Inject into the muscle.   . Omega-3 Fatty Acids (FISH OIL) 1000 MG CAPS Take 1 capsule by mouth daily.   . potassium chloride SA (K-DUR,KLOR-CON) 20 MEQ tablet    . sodium bicarbonate 650 MG tablet Take 650 mg by mouth daily.   . SUNItinib (SUTENT) 50 MG capsule    . tamsulosin (FLOMAX) 0.4 MG CAPS capsule  04/26/2016: Received from: External Pharmacy  . [DISCONTINUED] atorvastatin (LIPITOR) 80 MG tablet Take 80 mg by mouth daily.   . [DISCONTINUED] hydrALAZINE (APRESOLINE) 50 MG tablet Take 50 mg by mouth 2 (two) times daily.    No facility-administered encounter medications on file as of 05/27/2018.     ALLERGIES:  No Known Allergies   PHYSICAL EXAM:  ECOG Performance status: 1  Vitals:   05/27/18 1113  BP: 136/65  Pulse: 61  Resp: 18  Temp: 97.6 F (36.4 C)  SpO2: 100%   Filed Weights   05/27/18 1113  Weight: 218 lb (98.9 kg)    Physical Exam  Constitutional: He is oriented to person, place, and time. He appears well-developed and well-nourished.  Musculoskeletal: Normal range of motion.  Neurological: He is alert and oriented to person, place, and time.  Skin: Skin is warm and dry.  Psychiatric: He has a normal mood and affect. His behavior is normal. Judgment and thought content normal.  Abdomen: No palpable masses or hepatosplenomegaly.  Nontender to touch. Lymphadenopathy: No palpable adenopathy in the neck, axilla or inguinal region.   LABORATORY DATA:  I have reviewed the labs as listed.  CBC    Component Value Date/Time   WBC 5.5 05/20/2018 1342   RBC 4.03 (L) 05/20/2018 1342   HGB  12.5 (L) 05/20/2018 1342   HCT 39.0 05/20/2018 1342   PLT 122 (L) 05/20/2018 1342   MCV 96.8 05/20/2018 1342   MCH 31.0 05/20/2018 1342   MCHC 32.1 05/20/2018 1342   RDW 12.8 05/20/2018 1342   LYMPHSABS 0.6 (L) 05/20/2018 1342   MONOABS 0.6 05/20/2018 1342   EOSABS 0.3 05/20/2018 1342   BASOSABS 0.0 05/20/2018 1342   CMP Latest Ref Rng &  Units 05/20/2018 04/25/2018 01/22/2018  Glucose 70 - 99 mg/dL 96 90 102(H)  BUN 8 - 23 mg/dL 53(H) 44(H) 42(H)  Creatinine 0.61 - 1.24 mg/dL 3.68(H) 3.52(H) 3.92(H)  Sodium 135 - 145 mmol/L 137 139 138  Potassium 3.5 - 5.1 mmol/L 4.3 4.3 4.2  Chloride 98 - 111 mmol/L 103 107 107  CO2 22 - 32 mmol/L 26 25 25   Calcium 8.9 - 10.3 mg/dL 8.7(L) 9.0 9.0  Total Protein 6.5 - 8.1 g/dL 6.4(L) 6.3(L) 6.0(L)  Total Bilirubin 0.3 - 1.2 mg/dL 0.9 0.7 0.7  Alkaline Phos 38 - 126 U/L 53 51 55  AST 15 - 41 U/L 56(H) 51(H) 43(H)  ALT 0 - 44 U/L 28 28 22        DIAGNOSTIC IMAGING:  I have independently reviewed images of the PET CT scan and discussed with patient.     ASSESSMENT & PLAN:   Malignant carcinoid tumor ([place]) 1.  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  Initially managed at [facility].  Transferred care to any pain in October 2017. -Denies any carcinoid syndrome symptoms including flushing, diarrhea or wheezing.  Denies any abdominal pains. - Gallium-68 scan on 10/23/2017 shows metastatic disease in the liver, central right mesenteric mass and multiple peritoneal deposits. - He does not have any abdominal pains or symptoms of carcinoid syndrome. -I have reviewed his blood work.  Recent serotonin was 668 and chromogranin was 34.  They have been consistently going up. -I have reviewed the results of the Ga 68 dotatate PET CT scan on 04/22/2018 which showed multiple foci of intense radiotracer activity consistent with metastatic well-differentiated carcinoid tumor.  Metastatic sites  include central mesentery, multiple peritoneal implants, liver metastasis.  Several metastatic lesions have measured higher activity.  Attentional mild increase in size of central mesenteric mass.  Lesion within the right ventricle along the interventricular septum is most consistent with right intraventricular meta stasis. -I have reviewed echocardiogram dated 05/03/2018 which shows normal cavity size of the right ventricle with normal systolic function. - As there was progression on scans and labs, I have recommended starting him on everolimus.  We discussed the side effects in detail. - Everolimus 5 mg daily started on 05/17/2018.  So far he has tolerated it very well.  Blood counts did not show any major changes. - He is using dexamethasone mouthwash to prevent mucositis. -I will evaluate him again in 2 weeks for follow-up.  We will repeat labs at that time.  Today he will receive Sandostatin.   2.  Normocytic anemia: This is from a combination of iron deficiency and CKD.  Last Feraheme infusion was on 11/08/2017.  Hemoglobin today is 11.7.  We plan to repeat ferritin and iron panel at next visit.   3.  Head and neck cancer: Status post surgery and radiation therapy in 2009.  He is in remission.  4.  Kidney cancer: Status post right nephrectomy in May 2010 at [facility].  5.  Prostate cancer: Status post seed implant by Dr. Tammi Klippel in 2016.  He follows up with his urologist.      Orders placed this encounter:  Orders Placed This Encounter  Procedures  . Comprehensive metabolic panel  . CBC with Differential  . Magnesium      Derek Jack, MD [place] [phone]

## 2018-05-27 NOTE — Assessment & Plan Note (Signed)
1.  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  Initially managed at [facility].  Transferred care to any pain in October 2017. -Denies any carcinoid syndrome symptoms including flushing, diarrhea or wheezing.  Denies any abdominal pains. - Gallium-68 scan on 10/23/2017 shows metastatic disease in the liver, central right mesenteric mass and multiple peritoneal deposits. - He does not have any abdominal pains or symptoms of carcinoid syndrome. -I have reviewed his blood work.  Recent serotonin was 668 and chromogranin was 34.  They have been consistently going up. -I have reviewed the results of the Ga 68 dotatate PET CT scan on 04/22/2018 which showed multiple foci of intense radiotracer activity consistent with metastatic well-differentiated carcinoid tumor.  Metastatic sites include central mesentery, multiple peritoneal implants, liver metastasis.  Several metastatic lesions have measured higher activity.  Attentional mild increase in size of central mesenteric mass.  Lesion within the right ventricle along the interventricular septum is most consistent with right intraventricular meta stasis. -I have reviewed echocardiogram dated 05/03/2018 which shows normal cavity size of the right ventricle with normal systolic function. - As there was progression on scans and labs, I have recommended starting him on everolimus.  We discussed the side effects in detail. - Everolimus 5 mg daily started on 05/17/2018.  So far he has tolerated it very well.  Blood counts did not show any major changes. - He is using dexamethasone mouthwash to prevent mucositis. -I will evaluate him again in 2 weeks for follow-up.  We will repeat labs at that time.  Today he will receive Sandostatin.   2.  Normocytic anemia: This is from a combination of iron deficiency and CKD.  Last Feraheme infusion was on 11/08/2017.  Hemoglobin today is 11.7.  We plan to repeat  ferritin and iron panel at next visit.   3.  Head and neck cancer: Status post surgery and radiation therapy in 2009.  He is in remission.  4.  Kidney cancer: Status post right nephrectomy in May 2010 at [facility].  5.  Prostate cancer: Status post seed implant by Dr. Tammi Klippel in 2016.  He follows up with his urologist.

## 2018-05-27 NOTE — Progress Notes (Signed)
Patient tolerated injection with no complaints voiced.  Site clean and dry with no bruising or swelling noted at site.  Band aid applied.  Patient left ambulatory with no s/s of distress noted.

## 2018-05-28 DIAGNOSIS — E872 Acidosis: Secondary | ICD-10-CM | POA: Diagnosis not present

## 2018-05-28 DIAGNOSIS — R809 Proteinuria, unspecified: Secondary | ICD-10-CM | POA: Diagnosis not present

## 2018-05-28 DIAGNOSIS — N184 Chronic kidney disease, stage 4 (severe): Secondary | ICD-10-CM | POA: Diagnosis not present

## 2018-05-28 DIAGNOSIS — I10 Essential (primary) hypertension: Secondary | ICD-10-CM | POA: Diagnosis not present

## 2018-06-10 ENCOUNTER — Inpatient Hospital Stay (HOSPITAL_COMMUNITY): Payer: Medicare Other

## 2018-06-10 DIAGNOSIS — D631 Anemia in chronic kidney disease: Secondary | ICD-10-CM | POA: Diagnosis not present

## 2018-06-10 DIAGNOSIS — C7A00 Malignant carcinoid tumor of unspecified site: Secondary | ICD-10-CM | POA: Diagnosis not present

## 2018-06-10 DIAGNOSIS — Z79899 Other long term (current) drug therapy: Secondary | ICD-10-CM | POA: Diagnosis not present

## 2018-06-10 DIAGNOSIS — N189 Chronic kidney disease, unspecified: Secondary | ICD-10-CM | POA: Diagnosis not present

## 2018-06-10 DIAGNOSIS — C7B02 Secondary carcinoid tumors of liver: Secondary | ICD-10-CM | POA: Diagnosis not present

## 2018-06-10 LAB — CBC WITH DIFFERENTIAL/PLATELET
Abs Immature Granulocytes: 0.01 10*3/uL (ref 0.00–0.07)
Basophils Absolute: 0 10*3/uL (ref 0.0–0.1)
Basophils Relative: 1 %
EOS PCT: 4 %
Eosinophils Absolute: 0.1 10*3/uL (ref 0.0–0.5)
HEMATOCRIT: 35.4 % — AB (ref 39.0–52.0)
Hemoglobin: 11.4 g/dL — ABNORMAL LOW (ref 13.0–17.0)
Immature Granulocytes: 0 %
LYMPHS PCT: 9 %
Lymphs Abs: 0.3 10*3/uL — ABNORMAL LOW (ref 0.7–4.0)
MCH: 30.9 pg (ref 26.0–34.0)
MCHC: 32.2 g/dL (ref 30.0–36.0)
MCV: 95.9 fL (ref 80.0–100.0)
Monocytes Absolute: 0.5 10*3/uL (ref 0.1–1.0)
Monocytes Relative: 14 %
Neutro Abs: 2.6 10*3/uL (ref 1.7–7.7)
Neutrophils Relative %: 72 %
Platelets: 101 10*3/uL — ABNORMAL LOW (ref 150–400)
RBC: 3.69 MIL/uL — ABNORMAL LOW (ref 4.22–5.81)
RDW: 12 % (ref 11.5–15.5)
WBC: 3.7 10*3/uL — AB (ref 4.0–10.5)
nRBC: 0 % (ref 0.0–0.2)

## 2018-06-10 LAB — COMPREHENSIVE METABOLIC PANEL
ALT: 35 U/L (ref 0–44)
AST: 87 U/L — AB (ref 15–41)
Albumin: 3.1 g/dL — ABNORMAL LOW (ref 3.5–5.0)
Alkaline Phosphatase: 57 U/L (ref 38–126)
Anion gap: 7 (ref 5–15)
BILIRUBIN TOTAL: 0.8 mg/dL (ref 0.3–1.2)
BUN: 43 mg/dL — AB (ref 8–23)
CHLORIDE: 108 mmol/L (ref 98–111)
CO2: 22 mmol/L (ref 22–32)
CREATININE: 3.77 mg/dL — AB (ref 0.61–1.24)
Calcium: 8 mg/dL — ABNORMAL LOW (ref 8.9–10.3)
GFR calc Af Amer: 16 mL/min — ABNORMAL LOW (ref >60)
GFR, EST NON AFRICAN AMERICAN: 14 mL/min — AB (ref >60)
Glucose, Bld: 80 mg/dL (ref 70–99)
Potassium: 3.9 mmol/L (ref 3.5–5.1)
Sodium: 137 mmol/L (ref 135–145)
TOTAL PROTEIN: 6.3 g/dL — AB (ref 6.5–8.1)

## 2018-06-10 LAB — MAGNESIUM: Magnesium: 2.2 mg/dL (ref 1.7–2.4)

## 2018-06-11 ENCOUNTER — Encounter (HOSPITAL_COMMUNITY): Payer: Self-pay | Admitting: Hematology

## 2018-06-11 ENCOUNTER — Inpatient Hospital Stay (HOSPITAL_BASED_OUTPATIENT_CLINIC_OR_DEPARTMENT_OTHER): Payer: Medicare Other | Admitting: Hematology

## 2018-06-11 VITALS — BP 138/62 | HR 61 | Temp 97.7°F | Resp 18 | Wt 219.0 lb

## 2018-06-11 DIAGNOSIS — C7A00 Malignant carcinoid tumor of unspecified site: Secondary | ICD-10-CM | POA: Diagnosis not present

## 2018-06-11 DIAGNOSIS — D509 Iron deficiency anemia, unspecified: Secondary | ICD-10-CM

## 2018-06-11 DIAGNOSIS — D631 Anemia in chronic kidney disease: Secondary | ICD-10-CM | POA: Diagnosis not present

## 2018-06-11 DIAGNOSIS — Z79899 Other long term (current) drug therapy: Secondary | ICD-10-CM | POA: Diagnosis not present

## 2018-06-11 DIAGNOSIS — C7B02 Secondary carcinoid tumors of liver: Secondary | ICD-10-CM | POA: Diagnosis not present

## 2018-06-11 DIAGNOSIS — N189 Chronic kidney disease, unspecified: Secondary | ICD-10-CM | POA: Diagnosis not present

## 2018-06-11 NOTE — Progress Notes (Signed)
[place], [place] [address]   CLINIC:  Medical Oncology/Hematology  PCP:  [name], MD [place] [id number] [phone]   REASON FOR VISIT: Follow-up for metastatic carcinoid tumor  CURRENT THERAPY: Sandostatin monthly   INTERVAL HISTORY:  [name] 77 y.o. male returns for routine follow-up for metastatic carcinoid tumor. He is here today and doing well. He has no complaints at this time. He denies any skin peeling on hands or feet. Denies any new pain. Denies any nausea, vomiting, or diarrhea. Denies any recent fevers or infections. He reports his appetite at 100% he has no problem maintaining his weight. His energy level is 75%. He lives at home alone and still rakes his leaves and does all his own ADLs and activities.     REVIEW OF SYSTEMS:  Review of Systems  All other systems reviewed and are negative.    PAST MEDICAL/SURGICAL HISTORY:  Past Medical History:  Diagnosis Date  . Anemia   . Cancer [facility])    right renal . Prostate- Radiation treatment  . CHF (congestive heart failure) ([place])   . Chronic kidney disease    stage 4  . Edema   . Heart murmur    "nothing to worry about"  . Hyperlipidemia   . Hypertension   . Malignant carcinoid tumor ([place]) 01/03/2016   Past Surgical History:  Procedure Laterality Date  . AV FISTULA PLACEMENT Left 10/20/2014   Procedure: Left Arm ARTERIOVENOUS (AV) FISTULA CREATION;  Surgeon: Elam Dutch, MD;  Location: [facility];  Service: Vascular;  Laterality: Left;  . NEPHRECTOMY Right 2010     SOCIAL HISTORY:  Social History   Socioeconomic History  . Marital status: Married    Spouse name: Not on file  . Number of children: Not on file  . Years of education: Not on file  . Highest education level: Not on file  Occupational History  . Not on file  Social Needs  . Financial resource strain: Not on file  . Food insecurity:    Worry: Not on file   Inability: Not on file  . Transportation needs:    Medical: Not on file    Non-medical: Not on file  Tobacco Use  . Smoking status: Never Smoker  . Smokeless tobacco: Never Used  Substance and Sexual Activity  . Alcohol use: No    Alcohol/week: 0.0 standard drinks  . Drug use: No  . Sexual activity: Not on file  Lifestyle  . Physical activity:    Days per week: Not on file    Minutes per session: Not on file  . Stress: Not on file  Relationships  . Social connections:    Talks on phone: Not on file    Gets together: Not on file    Attends religious service: Not on file    Active member of club or organization: Not on file    Attends meetings of clubs or organizations: Not on file    Relationship status: Not on file  . Intimate partner violence:    Fear of current or ex partner: Not on file    Emotionally abused: Not on file    Physically abused: Not on file    Forced sexual activity: Not on file  Other Topics Concern  . Not on file  Social History Narrative  . Not on file    FAMILY HISTORY:  Family History  Problem Relation Age of  Onset  . Cancer Mother   . Hypertension Father   . Cancer Sister     CURRENT MEDICATIONS:  Outpatient Encounter Medications as of 06/11/2018  Medication Sig Note  . alendronate (FOSAMAX) 70 MG tablet TAKE ONE TABLET BY MOUTH ONCE A WEEK IN THE MORNING WITH A FULL GLASS OF WATER ON AN EMPTY STOMACH. DO NOT LAY DOWN FOR 30 MINUTES. 07/19/2016: Received from: External Pharmacy  . amLODipine (NORVASC) 10 MG tablet Take by mouth.   [name] atorvastatin (LIPITOR) 80 MG tablet    . bisoprolol-hydrochlorothiazide (ZIAC) 10-6.25 MG tablet    . calcitRIOL (ROCALTROL) 0.25 MCG capsule  04/26/2016: Received from: External Pharmacy  . carvedilol (COREG) 3.125 MG tablet Take by mouth.   . cholecalciferol (VITAMIN D) 1000 UNITS tablet Take 1,000 Units by mouth daily.   [name] dexamethasone (DECADRON) 0.5 MG/5ML solution Swish 76mL by mouth four times daily for 2  minutes then spit.   [name] everolimus (AFINITOR) 5 MG tablet Take 1 tablet (5 mg total) by mouth daily.   [name] FERREX 150 150 MG capsule Take 150 mg by mouth 2 (two) times daily.   . furosemide (LASIX) 40 MG tablet Take 40 mg by mouth.   . hydrALAZINE (APRESOLINE) 25 MG tablet TAKE THREE (3) TABLETS BY MOUTH TWICE DAILY 07/19/2016: Received from: External Pharmacy  . labetalol (NORMODYNE) 300 MG tablet Take 300 mg by mouth 2 (two) times daily.   [name] octreotide (SANDOSTATIN LAR DEPOT) 30 MG injection Inject into the muscle.   . Omega-3 Fatty Acids (FISH OIL) 1000 MG CAPS Take 1 capsule by mouth daily.   . potassium chloride SA (K-DUR,KLOR-CON) 20 MEQ tablet    . sodium bicarbonate 650 MG tablet Take 650 mg by mouth daily.   . SUNItinib (SUTENT) 50 MG capsule    . tamsulosin (FLOMAX) 0.4 MG CAPS capsule  04/26/2016: Received from: External Pharmacy   No facility-administered encounter medications on file as of 06/11/2018.     ALLERGIES:  No Known Allergies   PHYSICAL EXAM:  ECOG Performance status: 1  Vitals:   06/11/18 0755  BP: 138/62  Pulse: 61  Resp: 18  Temp: 97.7 F (36.5 C)  SpO2: 98%   Filed Weights   06/11/18 0755  Weight: 219 lb (99.3 kg)    Physical Exam  Constitutional: He is oriented to person, place, and time. He appears well-developed and well-nourished.  Musculoskeletal: Normal range of motion.  Neurological: He is alert and oriented to person, place, and time.  Skin: Skin is warm and dry.  Psychiatric: He has a normal mood and affect. His behavior is normal. Judgment and thought content normal.  Abdomen: No palpable hepatomegaly or masses. Extremities: No signs of hand-foot skin reaction.   LABORATORY DATA:  I have reviewed the labs as listed.  CBC    Component Value Date/Time   WBC 3.7 (L) 06/10/2018 1116   RBC 3.69 (L) 06/10/2018 1116   HGB 11.4 (L) 06/10/2018 1116   HCT 35.4 (L) 06/10/2018 1116   PLT 101 (L) 06/10/2018 1116   MCV 95.9 06/10/2018 1116    MCH 30.9 06/10/2018 1116   MCHC 32.2 06/10/2018 1116   RDW 12.0 06/10/2018 1116   LYMPHSABS 0.3 (L) 06/10/2018 1116   MONOABS 0.5 06/10/2018 1116   EOSABS 0.1 06/10/2018 1116   BASOSABS 0.0 06/10/2018 1116   CMP Latest Ref Rng & Units 06/10/2018 05/20/2018 04/25/2018  Glucose 70 - 99 mg/dL 80 96 90  BUN 8 - 23 mg/dL 43(H)  53(H) 44(H)  Creatinine 0.61 - 1.24 mg/dL 3.77(H) 3.68(H) 3.52(H)  Sodium 135 - 145 mmol/L 137 137 139  Potassium 3.5 - 5.1 mmol/L 3.9 4.3 4.3  Chloride 98 - 111 mmol/L 108 103 107  CO2 22 - 32 mmol/L 22 26 25   Calcium 8.9 - 10.3 mg/dL 8.0(L) 8.7(L) 9.0  Total Protein 6.5 - 8.1 g/dL 6.3(L) 6.4(L) 6.3(L)  Total Bilirubin 0.3 - 1.2 mg/dL 0.8 0.9 0.7  Alkaline Phos 38 - 126 U/L 57 53 51  AST 15 - 41 U/L 87(H) 56(H) 51(H)  ALT 0 - 44 U/L 35 28 28       DIAGNOSTIC IMAGING:  I have independently reviewed the scans and discussed with the patient.   I have reviewed Allen Finders, NP's note and agree with the documentation.  I personally performed a face-to-face visit, made revisions and my assessment and plan is as follows.   ASSESSMENT & PLAN:   Malignant carcinoid tumor ([place]) 1.  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  Initially managed at [place] center.  Transferred care to [facility] in October 2017. -Denies any carcinoid syndrome symptoms including flushing, diarrhea or wheezing.  Denies any abdominal pains. - Gallium-68 scan on 10/23/2017 shows metastatic disease in the liver, central right mesenteric mass and multiple peritoneal deposits. - He does not have any abdominal pains or symptoms of carcinoid syndrome. -Gallium-68 dotatate PET scan on 04/22/2018 showed multiple foci of intense radiotracer activity consistent with metastatic well-differentiated carcinoid tumor.  Metastatic sites include central mesentery, multiple peritoneal implants, liver metastasis.  Several metastatic lesions have  measured higher activity.  Additional mild increase in size of central mesenteric mass.  Lesion within the right ventricle along the interventricular septum thought to be metastatic. - Echocardiogram on 05/03/2018 showed normal cavity size of the right ventricular normal systolic function. -Everolimus 5 mg started on 05/17/2018 along with dexamethasone mouthwash. - Denies any side effects from it at this time.  I have reviewed his labs.  He has mild leukopenia and thrombocytopenia from everolimus. - He has developed transaminitis with elevated AST of 87.  This was previously 80.  We will closely monitor it as this can get worse with everolimus. -I will continue him at the same dose.  I will reevaluate him in 2 weeks.  2.  Normocytic anemia:  -This is from a combination of iron deficiency and CKD. -Last Feraheme infusion on 11/08/2017. -We reviewed CBC today.  Hemoglobin is 11.4.  Ferritin was 146 and percent saturation was 28.    3.  Head and neck cancer: Status post surgery and radiation therapy in 2009.  He is in remission.  4.  Kidney cancer: Status post right nephrectomy in May 2010 at [facility].  5.  Prostate cancer: Status post seed implant by Dr. Tammi Klippel in 2016.  He follows up with his urologist.      Orders placed this encounter:  Orders Placed This Encounter  Procedures  . Magnesium  . CBC with Differential/Platelet  . Comprehensive metabolic panel  . Chromogranin A    Please refer to Dr. Tomie China Assessment and Plan.  Derek Jack, MD [place] [phone]

## 2018-06-11 NOTE — Assessment & Plan Note (Signed)
1.  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  Initially managed at [place] center.  Transferred care to [facility] in October 2017. -Denies any carcinoid syndrome symptoms including flushing, diarrhea or wheezing.  Denies any abdominal pains. - Gallium-68 scan on 10/23/2017 shows metastatic disease in the liver, central right mesenteric mass and multiple peritoneal deposits. - He does not have any abdominal pains or symptoms of carcinoid syndrome. -Gallium-68 dotatate PET scan on 04/22/2018 showed multiple foci of intense radiotracer activity consistent with metastatic well-differentiated carcinoid tumor.  Metastatic sites include central mesentery, multiple peritoneal implants, liver metastasis.  Several metastatic lesions have measured higher activity.  Additional mild increase in size of central mesenteric mass.  Lesion within the right ventricle along the interventricular septum thought to be metastatic. - Echocardiogram on 05/03/2018 showed normal cavity size of the right ventricular normal systolic function. -Everolimus 5 mg started on 05/17/2018 along with dexamethasone mouthwash. - Denies any side effects from it at this time.  I have reviewed his labs.  He has mild leukopenia and thrombocytopenia from everolimus. - He has developed transaminitis with elevated AST of 87.  This was previously 66.  We will closely monitor it as this can get worse with everolimus. -I will continue him at the same dose.  I will reevaluate him in 2 weeks.  2.  Normocytic anemia:  -This is from a combination of iron deficiency and CKD. -Last Feraheme infusion on 11/08/2017. -We reviewed CBC today.  Hemoglobin is 11.4.  Ferritin was 146 and percent saturation was 28.    3.  Head and neck cancer: Status post surgery and radiation therapy in 2009.  He is in remission.  4.  Kidney cancer: Status post right nephrectomy in May 2010 at [facility].  5.  Prostate cancer: Status post seed implant by Dr. Tammi Klippel in 2016.  He follows up with his urologist.

## 2018-06-11 NOTE — Patient Instructions (Addendum)
[place] at [facility]  Discharge Instructions: Follow up on Dec 9th with labs and injection    _______________________________________________________________  Thank you for choosing [place] at [facility] to provide your oncology and hematology care.  To afford each patient quality time with our providers, please arrive at least 15 minutes before your scheduled appointment.  You need to re-schedule your appointment if you arrive 10 or more minutes late.  We strive to give you quality time with our providers, and arriving late affects you and other patients whose appointments are after yours.  Also, if you no show three or more times for appointments you may be dismissed from the clinic.  Again, thank you for choosing [place] at [place] hope is that these requests will allow you access to exceptional care and in a timely manner. _______________________________________________________________  If you have questions after your visit, please contact our office at [phone] between the hours of 8:30 a.m. and 5:00 p.m. Voicemails left after 4:30 p.m. will not be returned until the following business day. _______________________________________________________________  For prescription refill requests, have your pharmacy contact our office. _______________________________________________________________  Recommendations made by the consultant and any test results will be sent to your referring physician. _______________________________________________________________

## 2018-06-19 DIAGNOSIS — E1151 Type 2 diabetes mellitus with diabetic peripheral angiopathy without gangrene: Secondary | ICD-10-CM | POA: Diagnosis not present

## 2018-06-19 DIAGNOSIS — E1140 Type 2 diabetes mellitus with diabetic neuropathy, unspecified: Secondary | ICD-10-CM | POA: Diagnosis not present

## 2018-06-24 ENCOUNTER — Inpatient Hospital Stay (HOSPITAL_COMMUNITY): Payer: Medicare Other

## 2018-06-24 ENCOUNTER — Inpatient Hospital Stay (HOSPITAL_COMMUNITY): Payer: Medicare Other | Attending: Hematology

## 2018-06-24 ENCOUNTER — Ambulatory Visit (HOSPITAL_COMMUNITY): Payer: Medicare Other

## 2018-06-24 ENCOUNTER — Other Ambulatory Visit: Payer: Self-pay

## 2018-06-24 ENCOUNTER — Encounter (HOSPITAL_COMMUNITY): Payer: Self-pay | Admitting: Hematology

## 2018-06-24 ENCOUNTER — Inpatient Hospital Stay (HOSPITAL_BASED_OUTPATIENT_CLINIC_OR_DEPARTMENT_OTHER): Payer: Medicare Other | Admitting: Hematology

## 2018-06-24 VITALS — BP 127/62 | HR 57 | Temp 98.8°F | Resp 16 | Wt 215.0 lb

## 2018-06-24 DIAGNOSIS — Z8546 Personal history of malignant neoplasm of prostate: Secondary | ICD-10-CM

## 2018-06-24 DIAGNOSIS — Z85528 Personal history of other malignant neoplasm of kidney: Secondary | ICD-10-CM | POA: Diagnosis not present

## 2018-06-24 DIAGNOSIS — C7B02 Secondary carcinoid tumors of liver: Secondary | ICD-10-CM | POA: Insufficient documentation

## 2018-06-24 DIAGNOSIS — K130 Diseases of lips: Secondary | ICD-10-CM | POA: Diagnosis not present

## 2018-06-24 DIAGNOSIS — N189 Chronic kidney disease, unspecified: Secondary | ICD-10-CM | POA: Insufficient documentation

## 2018-06-24 DIAGNOSIS — D509 Iron deficiency anemia, unspecified: Secondary | ICD-10-CM

## 2018-06-24 DIAGNOSIS — C7A00 Malignant carcinoid tumor of unspecified site: Secondary | ICD-10-CM

## 2018-06-24 DIAGNOSIS — D696 Thrombocytopenia, unspecified: Secondary | ICD-10-CM | POA: Insufficient documentation

## 2018-06-24 DIAGNOSIS — D72819 Decreased white blood cell count, unspecified: Secondary | ICD-10-CM | POA: Insufficient documentation

## 2018-06-24 DIAGNOSIS — D649 Anemia, unspecified: Secondary | ICD-10-CM | POA: Insufficient documentation

## 2018-06-24 DIAGNOSIS — D631 Anemia in chronic kidney disease: Secondary | ICD-10-CM | POA: Diagnosis not present

## 2018-06-24 DIAGNOSIS — Z8589 Personal history of malignant neoplasm of other organs and systems: Secondary | ICD-10-CM | POA: Diagnosis not present

## 2018-06-24 LAB — CBC WITH DIFFERENTIAL/PLATELET
Abs Immature Granulocytes: 0.02 10*3/uL (ref 0.00–0.07)
BASOS ABS: 0 10*3/uL (ref 0.0–0.1)
Basophils Relative: 1 %
Eosinophils Absolute: 0.1 10*3/uL (ref 0.0–0.5)
Eosinophils Relative: 3 %
HCT: 35.3 % — ABNORMAL LOW (ref 39.0–52.0)
Hemoglobin: 11 g/dL — ABNORMAL LOW (ref 13.0–17.0)
Immature Granulocytes: 1 %
LYMPHS PCT: 10 %
Lymphs Abs: 0.4 10*3/uL — ABNORMAL LOW (ref 0.7–4.0)
MCH: 29.2 pg (ref 26.0–34.0)
MCHC: 31.2 g/dL (ref 30.0–36.0)
MCV: 93.6 fL (ref 80.0–100.0)
Monocytes Absolute: 0.6 10*3/uL (ref 0.1–1.0)
Monocytes Relative: 16 %
NRBC: 0 % (ref 0.0–0.2)
Neutro Abs: 2.7 10*3/uL (ref 1.7–7.7)
Neutrophils Relative %: 69 %
Platelets: 122 10*3/uL — ABNORMAL LOW (ref 150–400)
RBC: 3.77 MIL/uL — ABNORMAL LOW (ref 4.22–5.81)
RDW: 12.5 % (ref 11.5–15.5)
WBC: 3.9 10*3/uL — ABNORMAL LOW (ref 4.0–10.5)

## 2018-06-24 LAB — COMPREHENSIVE METABOLIC PANEL
ALBUMIN: 3.4 g/dL — AB (ref 3.5–5.0)
ALT: 27 U/L (ref 0–44)
AST: 58 U/L — ABNORMAL HIGH (ref 15–41)
Alkaline Phosphatase: 63 U/L (ref 38–126)
Anion gap: 6 (ref 5–15)
BUN: 46 mg/dL — ABNORMAL HIGH (ref 8–23)
CO2: 23 mmol/L (ref 22–32)
Calcium: 8.3 mg/dL — ABNORMAL LOW (ref 8.9–10.3)
Chloride: 112 mmol/L — ABNORMAL HIGH (ref 98–111)
Creatinine, Ser: 3.61 mg/dL — ABNORMAL HIGH (ref 0.61–1.24)
GFR calc Af Amer: 18 mL/min — ABNORMAL LOW (ref >60)
GFR calc non Af Amer: 15 mL/min — ABNORMAL LOW (ref >60)
GLUCOSE: 103 mg/dL — AB (ref 70–99)
Potassium: 4.3 mmol/L (ref 3.5–5.1)
Sodium: 141 mmol/L (ref 135–145)
Total Bilirubin: 0.8 mg/dL (ref 0.3–1.2)
Total Protein: 6.5 g/dL (ref 6.5–8.1)

## 2018-06-24 LAB — MAGNESIUM: Magnesium: 2.1 mg/dL (ref 1.7–2.4)

## 2018-06-24 MED ORDER — OCTREOTIDE ACETATE 30 MG IM KIT
30.0000 mg | PACK | Freq: Once | INTRAMUSCULAR | Status: AC
  Administered 2018-06-24: 30 mg via INTRAMUSCULAR

## 2018-06-24 NOTE — Progress Notes (Signed)
Allen Davenport presents today for injection per the provider's orders.  Sandostatin administration without incident; see MAR for injection details.  Patient tolerated procedure well and without incident.  No questions or complaints noted at this time.  Discharged ambulatory.

## 2018-06-24 NOTE — Patient Instructions (Signed)
[facility] at [facility] Discharge Instructions  Follow up in 2 weeks with labs    Thank you for choosing [facility] at [facility] to provide your oncology and hematology care.  To afford each patient quality time with our provider, please arrive at least 15 minutes before your scheduled appointment time.   If you have a lab appointment with the Cancer Center please come in thru the  Main Entrance and check in at the main information desk  You need to re-schedule your appointment should you arrive 10 or more minutes late.  We strive to give you quality time with our providers, and arriving late affects you and other patients whose appointments are after yours.  Also, if you no show three or more times for appointments you may be dismissed from the clinic at the providers discretion.     Again, thank you for choosing [facility].  Our hope is that these requests will decrease the amount of time that you wait before being seen by our physicians.       _____________________________________________________________  Should you have questions after your visit to [facility], please contact our office at [phone] between the hours of 8:00 a.m. and 4:30 p.m.  Voicemails left after 4:00 p.m. will not be returned until the following business day.  For prescription refill requests, have your pharmacy contact our office and allow 72 hours.    Cancer Center Support Programs:   > Cancer Support Group  2nd Tuesday of the month 1pm-2pm, Journey Room    

## 2018-06-24 NOTE — Assessment & Plan Note (Signed)
1.  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  Initially managed at [place] center.  Transferred care to [facility] in October 2017. -Denies any carcinoid syndrome symptoms including flushing, diarrhea or wheezing.  Denies any abdominal pains. - Gallium-68 scan on 10/23/2017 shows metastatic disease in the liver, central right mesenteric mass and multiple peritoneal deposits. - He does not have any abdominal pains or symptoms of carcinoid syndrome. -Gallium-68 dotatate PET scan on 04/22/2018 showed multiple foci of intense radiotracer activity consistent with metastatic well-differentiated carcinoid tumor.  Metastatic sites include central mesentery, multiple peritoneal implants, liver metastasis.  Several metastatic lesions have measured higher activity.  Additional mild increase in size of central mesenteric mass.  Lesion within the right ventricle along the interventricular septum thought to be metastatic. - 2D echo on 05/03/2018 showed normal cavity size of the right ventricle and normal systolic function.  -Everolimus 5 mg daily started on 05/17/2018 along with dexamethasone mouthwash. - He is continuing to tolerate everolimus very well.  He has 1 aphthous ulcer on the lower lip buccal surface.  He is eating well.  Denies any nausea vomiting or diarrhea.  No tiredness noted. - His AST has improved from last visit.  We will continue to closely monitor every 2 weeks for the first 2 months.  2.  Normocytic anemia:  -This is from combination of iron deficiency, CKD and bone marrow suppression from everolimus. -Last Feraheme was on 11/08/2017. -We reviewed his CBC today.  Hemoglobin is 11.  Last ferritin was 146 in November.   3.  Head and neck cancer: Status post surgery and radiation therapy in 2009.  He is in remission.  4.  Kidney cancer: Status post right nephrectomy in May 2010 at [facility].  5.  Prostate cancer:  Status post seed implant by Dr. Tammi Klippel in 2016.  He follows up with his urologist.

## 2018-06-24 NOTE — Progress Notes (Signed)
[place] [place], [place] [address]   CLINIC:  Medical Oncology/Hematology  PCP:  [name], MD [place] [phone]   REASON FOR VISIT: Follow-up for metastatic carcinoid tumor  CURRENT THERAPY: Everolimus tabs daily and Sandostatin monthly   INTERVAL HISTORY:  Allen Davenport 77 y.o. male returns for routine follow-up for metastatic carcinoid tumor. He is here today and tolerating the Everolimus and sandostatin injections well. He does have a sore on his lower lip. He states it is healing and doesn't hurt bad now. He is still using his mouth wash as needed. He has no other complaints at this time. He denies any fevers or recent infections. Denies any new pains. Denies any nausea, vomiting, or diarrhea. Denies any bleeding or easy bruising. He reports his appetite and energy level at 75%. He states he is unable to eat big meals but he has been trying to eat 4-5 small meals daily.     REVIEW OF SYSTEMS:  Review of Systems  HENT:   Positive for mouth sores.   All other systems reviewed and are negative.    PAST MEDICAL/SURGICAL HISTORY:  Past Medical History:  Diagnosis Date  . Anemia   . Cancer [facility])    right renal . Prostate- Radiation treatment  . CHF (congestive heart failure) ([place])   . Chronic kidney disease    stage 4  . Edema   . Heart murmur    "nothing to worry about"  . Hyperlipidemia   . Hypertension   . Malignant carcinoid tumor ([place]) 01/03/2016   Past Surgical History:  Procedure Laterality Date  . AV FISTULA PLACEMENT Left 10/20/2014   Procedure: Left Arm ARTERIOVENOUS (AV) FISTULA CREATION;  Surgeon: Elam Dutch, MD;  Location: [facility];  Service: Vascular;  Laterality: Left;  . NEPHRECTOMY Right 2010     SOCIAL HISTORY:  Social History   Socioeconomic History  . Marital status: Married    Spouse name: Not on file  . Number of children: Not on file  . Years of education: Not on  file  . Highest education level: Not on file  Occupational History  . Not on file  Social Needs  . Financial resource strain: Not on file  . Food insecurity:    Worry: Not on file    Inability: Not on file  . Transportation needs:    Medical: Not on file    Non-medical: Not on file  Tobacco Use  . Smoking status: Never Smoker  . Smokeless tobacco: Never Used  Substance and Sexual Activity  . Alcohol use: No    Alcohol/week: 0.0 standard drinks  . Drug use: No  . Sexual activity: Not on file  Lifestyle  . Physical activity:    Days per week: Not on file    Minutes per session: Not on file  . Stress: Not on file  Relationships  . Social connections:    Talks on phone: Not on file    Gets together: Not on file    Attends religious service: Not on file    Active member of club or organization: Not on file    Attends meetings of clubs or organizations: Not on file    Relationship status: Not on file  . Intimate partner violence:    Fear of current or ex partner: Not on file    Emotionally abused: Not on file    Physically abused: Not on file  Forced sexual activity: Not on file  Other Topics Concern  . Not on file  Social History Narrative  . Not on file    FAMILY HISTORY:  Family History  Problem Relation Age of Onset  . Cancer Mother   . Hypertension Father   . Cancer Sister     CURRENT MEDICATIONS:  Outpatient Encounter Medications as of 06/24/2018  Medication Sig Note  . alendronate (FOSAMAX) 70 MG tablet TAKE ONE TABLET BY MOUTH ONCE A WEEK IN THE MORNING WITH A FULL GLASS OF WATER ON AN EMPTY STOMACH. DO NOT LAY DOWN FOR 30 MINUTES. 07/19/2016: Received from: External Pharmacy  . amLODipine (NORVASC) 10 MG tablet Take by mouth.   [name] atorvastatin (LIPITOR) 80 MG tablet    . bisoprolol-hydrochlorothiazide (ZIAC) 10-6.25 MG tablet    . calcitRIOL (ROCALTROL) 0.25 MCG capsule  04/26/2016: Received from: External Pharmacy  . carvedilol (COREG) 3.125 MG tablet  Take by mouth.   . cholecalciferol (VITAMIN D) 1000 UNITS tablet Take 1,000 Units by mouth daily.   [name] dexamethasone (DECADRON) 0.5 MG/5ML solution Swish 3mL by mouth four times daily for 2 minutes then spit.   [name] everolimus (AFINITOR) 5 MG tablet Take 1 tablet (5 mg total) by mouth daily.   [name] FERREX 150 150 MG capsule Take 150 mg by mouth 2 (two) times daily.   . furosemide (LASIX) 40 MG tablet Take 40 mg by mouth.   . hydrALAZINE (APRESOLINE) 25 MG tablet TAKE THREE (3) TABLETS BY MOUTH TWICE DAILY 07/19/2016: Received from: External Pharmacy  . labetalol (NORMODYNE) 300 MG tablet Take 300 mg by mouth 2 (two) times daily.   [name] octreotide (SANDOSTATIN LAR DEPOT) 30 MG injection Inject into the muscle.   . Omega-3 Fatty Acids (FISH OIL) 1000 MG CAPS Take 1 capsule by mouth daily.   . potassium chloride SA (K-DUR,KLOR-CON) 20 MEQ tablet    . sodium bicarbonate 650 MG tablet Take 650 mg by mouth daily.   . SUNItinib (SUTENT) 50 MG capsule    . tamsulosin (FLOMAX) 0.4 MG CAPS capsule  04/26/2016: Received from: External Pharmacy   No facility-administered encounter medications on file as of 06/24/2018.     ALLERGIES:  No Known Allergies   PHYSICAL EXAM:  ECOG Performance status: 1  Vitals:   06/24/18 1336  BP: 127/62  Pulse: (!) 57  Resp: 16  Temp: 98.8 F (37.1 C)  SpO2: 100%   Filed Weights   06/24/18 1336  Weight: 215 lb (97.5 kg)    Physical Exam  Constitutional: He is oriented to person, place, and time. He appears well-developed and well-nourished.  Musculoskeletal: Normal range of motion.  Neurological: He is alert and oriented to person, place, and time.  Skin: Skin is warm and dry.  Psychiatric: He has a normal mood and affect. His behavior is normal. Judgment and thought content normal.     LABORATORY DATA:  I have reviewed the labs as listed.  CBC    Component Value Date/Time   WBC 3.9 (L) 06/24/2018 1305   RBC 3.77 (L) 06/24/2018 1305   HGB 11.0 (L)  06/24/2018 1305   HCT 35.3 (L) 06/24/2018 1305   PLT 122 (L) 06/24/2018 1305   MCV 93.6 06/24/2018 1305   MCH 29.2 06/24/2018 1305   MCHC 31.2 06/24/2018 1305   RDW 12.5 06/24/2018 1305   LYMPHSABS 0.4 (L) 06/24/2018 1305   MONOABS 0.6 06/24/2018 1305   EOSABS 0.1 06/24/2018 1305   BASOSABS 0.0 06/24/2018 1305  CMP Latest Ref Rng & Units 06/24/2018 06/10/2018 05/20/2018  Glucose 70 - 99 mg/dL 103(H) 80 96  BUN 8 - 23 mg/dL 46(H) 43(H) 53(H)  Creatinine 0.61 - 1.24 mg/dL 3.61(H) 3.77(H) 3.68(H)  Sodium 135 - 145 mmol/L 141 137 137  Potassium 3.5 - 5.1 mmol/L 4.3 3.9 4.3  Chloride 98 - 111 mmol/L 112(H) 108 103  CO2 22 - 32 mmol/L 23 22 26   Calcium 8.9 - 10.3 mg/dL 8.3(L) 8.0(L) 8.7(L)  Total Protein 6.5 - 8.1 g/dL 6.5 6.3(L) 6.4(L)  Total Bilirubin 0.3 - 1.2 mg/dL 0.8 0.8 0.9  Alkaline Phos 38 - 126 U/L 63 57 53  AST 15 - 41 U/L 58(H) 87(H) 56(H)  ALT 0 - 44 U/L 27 35 28      I have reviewed Francene Finders, NP's note and agree with the documentation.  I personally performed a face-to-face visit, made revisions and my assessment and plan is as follows.       ASSESSMENT & PLAN:   Malignant carcinoid tumor ([place]) 1.  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  Initially managed at [place] [facility].  Transferred care to [facility] in October 2017. -Denies any carcinoid syndrome symptoms including flushing, diarrhea or wheezing.  Denies any abdominal pains. - Gallium-68 scan on 10/23/2017 shows metastatic disease in the liver, central right mesenteric mass and multiple peritoneal deposits. - He does not have any abdominal pains or symptoms of carcinoid syndrome. -Gallium-68 dotatate PET scan on 04/22/2018 showed multiple foci of intense radiotracer activity consistent with metastatic well-differentiated carcinoid tumor.  Metastatic sites include central mesentery, multiple peritoneal implants, liver metastasis.  Several  metastatic lesions have measured higher activity.  Additional mild increase in size of central mesenteric mass.  Lesion within the right ventricle along the interventricular septum thought to be metastatic. - 2D echo on 05/03/2018 showed normal cavity size of the right ventricle and normal systolic function.  -Everolimus 5 mg daily started on 05/17/2018 along with dexamethasone mouthwash. - He is continuing to tolerate everolimus very well.  He has 1 aphthous ulcer on the lower lip buccal surface.  He is eating well.  Denies any nausea vomiting or diarrhea.  No tiredness noted. - His AST has improved from last visit.  We will continue to closely monitor every 2 weeks for the first 2 months.  2.  Normocytic anemia:  -This is from combination of iron deficiency, CKD and bone marrow suppression from everolimus. -Last Feraheme was on 11/08/2017. -We reviewed his CBC today.  Hemoglobin is 11.  Last ferritin was 146 in November.   3.  Head and neck cancer: Status post surgery and radiation therapy in 2009.  He is in remission.  4.  Kidney cancer: Status post right nephrectomy in May 2010 at [facility].  5.  Prostate cancer: Status post seed implant by Dr. Tammi Klippel in 2016.  He follows up with his urologist.      Orders placed this encounter:  Orders Placed This Encounter  Procedures  . Magnesium  . CBC with Differential/Platelet  . Comprehensive metabolic panel  . Chromogranin A      Allen Jack, MD [place] [phone]

## 2018-06-25 LAB — CHROMOGRANIN A: Chromogranin A: 32 nmol/L — ABNORMAL HIGH (ref 0–5)

## 2018-07-05 ENCOUNTER — Inpatient Hospital Stay (HOSPITAL_COMMUNITY): Payer: Medicare Other

## 2018-07-05 DIAGNOSIS — C7B02 Secondary carcinoid tumors of liver: Secondary | ICD-10-CM | POA: Diagnosis not present

## 2018-07-05 DIAGNOSIS — D72819 Decreased white blood cell count, unspecified: Secondary | ICD-10-CM | POA: Diagnosis not present

## 2018-07-05 DIAGNOSIS — N189 Chronic kidney disease, unspecified: Secondary | ICD-10-CM | POA: Diagnosis not present

## 2018-07-05 DIAGNOSIS — D509 Iron deficiency anemia, unspecified: Secondary | ICD-10-CM | POA: Diagnosis not present

## 2018-07-05 DIAGNOSIS — C7A00 Malignant carcinoid tumor of unspecified site: Secondary | ICD-10-CM | POA: Diagnosis not present

## 2018-07-05 DIAGNOSIS — D631 Anemia in chronic kidney disease: Secondary | ICD-10-CM | POA: Diagnosis not present

## 2018-07-05 LAB — CBC WITH DIFFERENTIAL/PLATELET
Abs Immature Granulocytes: 0.01 10*3/uL (ref 0.00–0.07)
Basophils Absolute: 0 10*3/uL (ref 0.0–0.1)
Basophils Relative: 0 %
EOS PCT: 4 %
Eosinophils Absolute: 0.1 10*3/uL (ref 0.0–0.5)
HCT: 34.2 % — ABNORMAL LOW (ref 39.0–52.0)
Hemoglobin: 10.9 g/dL — ABNORMAL LOW (ref 13.0–17.0)
Immature Granulocytes: 0 %
Lymphocytes Relative: 11 %
Lymphs Abs: 0.4 10*3/uL — ABNORMAL LOW (ref 0.7–4.0)
MCH: 29.7 pg (ref 26.0–34.0)
MCHC: 31.9 g/dL (ref 30.0–36.0)
MCV: 93.2 fL (ref 80.0–100.0)
Monocytes Absolute: 0.5 10*3/uL (ref 0.1–1.0)
Monocytes Relative: 13 %
Neutro Abs: 2.7 10*3/uL (ref 1.7–7.7)
Neutrophils Relative %: 72 %
Platelets: 110 10*3/uL — ABNORMAL LOW (ref 150–400)
RBC: 3.67 MIL/uL — AB (ref 4.22–5.81)
RDW: 12.6 % (ref 11.5–15.5)
WBC: 3.7 10*3/uL — ABNORMAL LOW (ref 4.0–10.5)
nRBC: 0 % (ref 0.0–0.2)

## 2018-07-05 LAB — COMPREHENSIVE METABOLIC PANEL
ALT: 26 U/L (ref 0–44)
ANION GAP: 8 (ref 5–15)
AST: 51 U/L — ABNORMAL HIGH (ref 15–41)
Albumin: 3.4 g/dL — ABNORMAL LOW (ref 3.5–5.0)
Alkaline Phosphatase: 71 U/L (ref 38–126)
BUN: 41 mg/dL — ABNORMAL HIGH (ref 8–23)
CO2: 24 mmol/L (ref 22–32)
Calcium: 8.8 mg/dL — ABNORMAL LOW (ref 8.9–10.3)
Chloride: 107 mmol/L (ref 98–111)
Creatinine, Ser: 3.55 mg/dL — ABNORMAL HIGH (ref 0.61–1.24)
GFR calc Af Amer: 18 mL/min — ABNORMAL LOW (ref >60)
GFR calc non Af Amer: 16 mL/min — ABNORMAL LOW (ref >60)
Glucose, Bld: 98 mg/dL (ref 70–99)
Potassium: 4.4 mmol/L (ref 3.5–5.1)
Sodium: 139 mmol/L (ref 135–145)
Total Bilirubin: 0.6 mg/dL (ref 0.3–1.2)
Total Protein: 6.2 g/dL — ABNORMAL LOW (ref 6.5–8.1)

## 2018-07-05 LAB — MAGNESIUM: Magnesium: 2 mg/dL (ref 1.7–2.4)

## 2018-07-06 LAB — CHROMOGRANIN A: Chromogranin A: 25 nmol/L — ABNORMAL HIGH (ref 0–5)

## 2018-07-08 ENCOUNTER — Encounter (HOSPITAL_COMMUNITY): Payer: Self-pay | Admitting: Hematology

## 2018-07-08 ENCOUNTER — Other Ambulatory Visit: Payer: Self-pay

## 2018-07-08 ENCOUNTER — Inpatient Hospital Stay (HOSPITAL_BASED_OUTPATIENT_CLINIC_OR_DEPARTMENT_OTHER): Payer: Medicare Other | Admitting: Hematology

## 2018-07-08 VITALS — BP 119/69 | HR 65 | Temp 98.0°F | Resp 14 | Wt 217.4 lb

## 2018-07-08 DIAGNOSIS — Z85528 Personal history of other malignant neoplasm of kidney: Secondary | ICD-10-CM | POA: Diagnosis not present

## 2018-07-08 DIAGNOSIS — N189 Chronic kidney disease, unspecified: Secondary | ICD-10-CM | POA: Diagnosis not present

## 2018-07-08 DIAGNOSIS — C7A00 Malignant carcinoid tumor of unspecified site: Secondary | ICD-10-CM

## 2018-07-08 DIAGNOSIS — D509 Iron deficiency anemia, unspecified: Secondary | ICD-10-CM | POA: Diagnosis not present

## 2018-07-08 DIAGNOSIS — D72819 Decreased white blood cell count, unspecified: Secondary | ICD-10-CM

## 2018-07-08 DIAGNOSIS — C7B02 Secondary carcinoid tumors of liver: Secondary | ICD-10-CM | POA: Diagnosis not present

## 2018-07-08 DIAGNOSIS — Z8546 Personal history of malignant neoplasm of prostate: Secondary | ICD-10-CM

## 2018-07-08 DIAGNOSIS — D696 Thrombocytopenia, unspecified: Secondary | ICD-10-CM | POA: Diagnosis not present

## 2018-07-08 DIAGNOSIS — Z8589 Personal history of malignant neoplasm of other organs and systems: Secondary | ICD-10-CM

## 2018-07-08 DIAGNOSIS — D631 Anemia in chronic kidney disease: Secondary | ICD-10-CM | POA: Diagnosis not present

## 2018-07-08 NOTE — Patient Instructions (Addendum)
[place] at [facility]  Discharge Instructions: Follow up in 4 weeks with labs.   You saw Dr. Delton Coombes today. _______________________________________________________________  Thank you for choosing [place] at [facility] to provide your oncology and hematology care.  To afford each patient quality time with our providers, please arrive at least 15 minutes before your scheduled appointment.  You need to re-schedule your appointment if you arrive 10 or more minutes late.  We strive to give you quality time with our providers, and arriving late affects you and other patients whose appointments are after yours.  Also, if you no show three or more times for appointments you may be dismissed from the clinic.  Again, thank you for choosing [place] at [place] hope is that these requests will allow you access to exceptional care and in a timely manner. _______________________________________________________________  If you have questions after your visit, please contact our office at [phone] between the hours of 8:30 a.m. and 5:00 p.m. Voicemails left after 4:30 p.m. will not be returned until the following business day. _______________________________________________________________  For prescription refill requests, have your pharmacy contact our office. _______________________________________________________________  Recommendations made by the consultant and any test results will be sent to your referring physician. _______________________________________________________________

## 2018-07-08 NOTE — Progress Notes (Signed)
[place], [place] [address]   CLINIC:  Medical Oncology/Hematology  PCP:  [name], MD [place] [phone]   REASON FOR VISIT: Follow-up for metastatic carcinoid tumor  CURRENT THERAPY: Everolimus tabs daily and Sandostatin monthly   INTERVAL HISTORY:  [name] 77 y.o. male returns for routine follow-up for metastatic carcinoid tumor. He is here today and doing well. He has no complaints today. Denies any nausea, vomiting, or diarrhea. Denies any new pains. Had not noticed any recent bleeding such as epistaxis, hematuria or hematochezia. Denies recent chest pain on exertion, shortness of breath on minimal exertion, pre-syncopal episodes, or palpitations. Denies any new cough or hemoptysis. Denies any numbness or tingling in hands or feet. Denies any recent fevers, infections, or recent hospitalizations. He reports his appetite and energy level is 75%.     REVIEW OF SYSTEMS:  Review of Systems  All other systems reviewed and are negative.    PAST MEDICAL/SURGICAL HISTORY:  Past Medical History:  Diagnosis Date  . Anemia   . Cancer [facility])    right renal . Prostate- Radiation treatment  . CHF (congestive heart failure) ([place])   . Chronic kidney disease    stage 4  . Edema   . Heart murmur    "nothing to worry about"  . Hyperlipidemia   . Hypertension   . Malignant carcinoid tumor ([place]) 01/03/2016   Past Surgical History:  Procedure Laterality Date  . AV FISTULA PLACEMENT Left 10/20/2014   Procedure: Left Arm ARTERIOVENOUS (AV) FISTULA CREATION;  Surgeon: Elam Dutch, MD;  Location: [facility];  Service: Vascular;  Laterality: Left;  . NEPHRECTOMY Right 2010     SOCIAL HISTORY:  Social History   Socioeconomic History  . Marital status: Married    Spouse name: Not on file  . Number of children: Not on file  . Years of education: Not on file  . Highest education level: Not on file    Occupational History  . Not on file  Social Needs  . Financial resource strain: Not on file  . Food insecurity:    Worry: Not on file    Inability: Not on file  . Transportation needs:    Medical: Not on file    Non-medical: Not on file  Tobacco Use  . Smoking status: Never Smoker  . Smokeless tobacco: Never Used  Substance and Sexual Activity  . Alcohol use: No    Alcohol/week: 0.0 standard drinks  . Drug use: No  . Sexual activity: Not on file  Lifestyle  . Physical activity:    Days per week: Not on file    Minutes per session: Not on file  . Stress: Not on file  Relationships  . Social connections:    Talks on phone: Not on file    Gets together: Not on file    Attends religious service: Not on file    Active member of club or organization: Not on file    Attends meetings of clubs or organizations: Not on file    Relationship status: Not on file  . Intimate partner violence:    Fear of current or ex partner: Not on file    Emotionally abused: Not on file    Physically abused: Not on file    Forced sexual activity: Not on file  Other Topics Concern  . Not on file  Social History Narrative  . Not on  file    FAMILY HISTORY:  Family History  Problem Relation Age of Onset  . Cancer Mother   . Hypertension Father   . Cancer Sister     CURRENT MEDICATIONS:  Outpatient Encounter Medications as of 07/08/2018  Medication Sig Note  . alendronate (FOSAMAX) 70 MG tablet TAKE ONE TABLET BY MOUTH ONCE A WEEK IN THE MORNING WITH A FULL GLASS OF WATER ON AN EMPTY STOMACH. DO NOT LAY DOWN FOR 30 MINUTES. 07/19/2016: Received from: External Pharmacy  . amLODipine (NORVASC) 10 MG tablet Take by mouth.   [name] atorvastatin (LIPITOR) 80 MG tablet    . bisoprolol-hydrochlorothiazide (ZIAC) 10-6.25 MG tablet    . calcitRIOL (ROCALTROL) 0.25 MCG capsule  04/26/2016: Received from: External Pharmacy  . carvedilol (COREG) 3.125 MG tablet Take by mouth.   . cholecalciferol (VITAMIN D)  1000 UNITS tablet Take 1,000 Units by mouth daily.   [name] dexamethasone (DECADRON) 0.5 MG/5ML solution Swish 77mL by mouth four times daily for 2 minutes then spit.   [name] everolimus (AFINITOR) 5 MG tablet Take 1 tablet (5 mg total) by mouth daily.   [name] FERREX 150 150 MG capsule Take 150 mg by mouth 2 (two) times daily.   . furosemide (LASIX) 40 MG tablet Take 40 mg by mouth.   . hydrALAZINE (APRESOLINE) 25 MG tablet TAKE THREE (3) TABLETS BY MOUTH TWICE DAILY 07/19/2016: Received from: External Pharmacy  . labetalol (NORMODYNE) 300 MG tablet Take 300 mg by mouth 2 (two) times daily.   [name] octreotide (SANDOSTATIN LAR DEPOT) 30 MG injection Inject into the muscle.   . Omega-3 Fatty Acids (FISH OIL) 1000 MG CAPS Take 1 capsule by mouth daily.   . potassium chloride SA (K-DUR,KLOR-CON) 20 MEQ tablet    . sodium bicarbonate 650 MG tablet Take 650 mg by mouth daily.   . SUNItinib (SUTENT) 50 MG capsule    . tamsulosin (FLOMAX) 0.4 MG CAPS capsule  04/26/2016: Received from: External Pharmacy   No facility-administered encounter medications on file as of 07/08/2018.     ALLERGIES:  No Known Allergies   PHYSICAL EXAM:  ECOG Performance status: 1  Vitals:   07/08/18 0831  BP: 119/69  Pulse: 65  Resp: 14  Temp: 98 F (36.7 C)  SpO2: 99%   Filed Weights   07/08/18 0831  Weight: 217 lb 6.4 oz (98.6 kg)    Physical Exam Constitutional:      Appearance: Normal appearance. He is normal weight.  Cardiovascular:     Rate and Rhythm: Normal rate and regular rhythm.     Heart sounds: Normal heart sounds.  Pulmonary:     Effort: Pulmonary effort is normal.     Breath sounds: Normal breath sounds.  Musculoskeletal: Normal range of motion.  Skin:    General: Skin is warm and dry.  Neurological:     Mental Status: He is alert and oriented to person, place, and time. Mental status is at baseline.  Psychiatric:        Mood and Affect: Mood normal.        Behavior: Behavior normal.        Thought  Content: Thought content normal.        Judgment: Judgment normal.    No hand-foot skin reaction or mucositis noted.  LABORATORY DATA:  I have reviewed the labs as listed.  CBC    Component Value Date/Time   WBC 3.7 (L) 07/05/2018 1152   RBC 3.67 (L) 07/05/2018 1152  HGB 10.9 (L) 07/05/2018 1152   HCT 34.2 (L) 07/05/2018 1152   PLT 110 (L) 07/05/2018 1152   MCV 93.2 07/05/2018 1152   MCH 29.7 07/05/2018 1152   MCHC 31.9 07/05/2018 1152   RDW 12.6 07/05/2018 1152   LYMPHSABS 0.4 (L) 07/05/2018 1152   MONOABS 0.5 07/05/2018 1152   EOSABS 0.1 07/05/2018 1152   BASOSABS 0.0 07/05/2018 1152   CMP Latest Ref Rng & Units 07/05/2018 06/24/2018 06/10/2018  Glucose 70 - 99 mg/dL 98 103(H) 80  BUN 8 - 23 mg/dL 41(H) 46(H) 43(H)  Creatinine 0.61 - 1.24 mg/dL 3.55(H) 3.61(H) 3.77(H)  Sodium 135 - 145 mmol/L 139 141 137  Potassium 3.5 - 5.1 mmol/L 4.4 4.3 3.9  Chloride 98 - 111 mmol/L 107 112(H) 108  CO2 22 - 32 mmol/L 24 23 22   Calcium 8.9 - 10.3 mg/dL 8.8(L) 8.3(L) 8.0(L)  Total Protein 6.5 - 8.1 g/dL 6.2(L) 6.5 6.3(L)  Total Bilirubin 0.3 - 1.2 mg/dL 0.6 0.8 0.8  Alkaline Phos 38 - 126 U/L 71 63 57  AST 15 - 41 U/L 51(H) 58(H) 87(H)  ALT 0 - 44 U/L 26 27 35       DIAGNOSTIC IMAGING:  I have independently reviewed the scans and discussed with the patient.   I have reviewed Francene Finders, NP's note and agree with the documentation.  I personally performed a face-to-face visit, made revisions and my assessment and plan is as follows.    ASSESSMENT & PLAN:   Malignant carcinoid tumor ([place]) 1.  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  Initially managed at [facility].  Transferred care to [facility] in October 2017. -Denies any carcinoid syndrome symptoms including flushing, diarrhea or wheezing.  Denies any abdominal pains. - Gallium-68 scan on 10/23/2017 shows metastatic disease in the liver, central right  mesenteric mass and multiple peritoneal deposits. - He does not have any abdominal pains or symptoms of carcinoid syndrome. -Gallium-68 dotatate PET scan on 04/22/2018 showed multiple foci of intense radiotracer activity consistent with metastatic well-differentiated carcinoid tumor.  Metastatic sites include central mesentery, multiple peritoneal implants, liver metastasis.  Several metastatic lesions have measured higher activity.  Additional mild increase in size of central mesenteric mass.  Lesion within the right ventricle along the interventricular septum thought to be metastatic. - 2D echo on 05/03/2018 showed normal cavity size of the right ventricle and normal systolic function.  -Everolimus 5 mg daily started on 05/17/2018 along with dexamethasone mouthwash.  - He is tolerating everolimus well.  No aphthous ulcers or hand-foot skin reaction noted. - He has mild leukopenia, thrombocytopenia and anemia likely from everolimus.  We will watch his counts very closely. -His chromogranin level is improving nicely.  Last octreotide injection on 06/24/2018. -I will see him back in 4 weeks with repeat blood work.  2.  Normocytic anemia:  -Etiology from iron deficiency, CKD and bone marrow suppression from everolimus. -Last Feraheme infusion on 11/08/2017. -His hemoglobin is close to 11.  Last ferritin was 146 in November.  3.  Head and neck cancer: Status post surgery and radiation therapy in 2009.  He is in remission.  4.  Kidney cancer: Status post right nephrectomy in May 2010 at [facility]  5.  Prostate cancer: Status post seed implant by Dr. Tammi Klippel in 2016.  He follows up with his urologist.      Orders placed this encounter:  Orders Placed  This Encounter  Procedures  . Chromogranin A  . Magnesium  . CBC with Differential/Platelet  . Comprehensive metabolic panel      Derek Jack, MD [place] [phone]

## 2018-07-08 NOTE — Assessment & Plan Note (Signed)
1.  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  Initially managed at [facility].  Transferred care to [facility] in October 2017. -Denies any carcinoid syndrome symptoms including flushing, diarrhea or wheezing.  Denies any abdominal pains. - Gallium-68 scan on 10/23/2017 shows metastatic disease in the liver, central right mesenteric mass and multiple peritoneal deposits. - He does not have any abdominal pains or symptoms of carcinoid syndrome. -Gallium-68 dotatate PET scan on 04/22/2018 showed multiple foci of intense radiotracer activity consistent with metastatic well-differentiated carcinoid tumor.  Metastatic sites include central mesentery, multiple peritoneal implants, liver metastasis.  Several metastatic lesions have measured higher activity.  Additional mild increase in size of central mesenteric mass.  Lesion within the right ventricle along the interventricular septum thought to be metastatic. - 2D echo on 05/03/2018 showed normal cavity size of the right ventricle and normal systolic function.  -Everolimus 5 mg daily started on 05/17/2018 along with dexamethasone mouthwash.  - He is tolerating everolimus well.  No aphthous ulcers or hand-foot skin reaction noted. - He has mild leukopenia, thrombocytopenia and anemia likely from everolimus.  We will watch his counts very closely. -His chromogranin level is improving nicely.  Last octreotide injection on 06/24/2018. -I will see him back in 4 weeks with repeat blood work.  2.  Normocytic anemia:  -Etiology from iron deficiency, CKD and bone marrow suppression from everolimus. -Last Feraheme infusion on 11/08/2017. -His hemoglobin is close to 11.  Last ferritin was 146 in November.  3.  Head and neck cancer: Status post surgery and radiation therapy in 2009.  He is in remission.  4.  Kidney cancer: Status post right nephrectomy in May 2010 at [facility]  5.   Prostate cancer: Status post seed implant by Dr. Tammi Klippel in 2016.  He follows up with his urologist.

## 2018-07-22 ENCOUNTER — Encounter (HOSPITAL_COMMUNITY): Payer: Self-pay

## 2018-07-22 ENCOUNTER — Inpatient Hospital Stay (HOSPITAL_COMMUNITY): Payer: Medicare Other | Attending: Oncology

## 2018-07-22 VITALS — BP 139/72 | HR 55 | Temp 98.4°F | Resp 16

## 2018-07-22 DIAGNOSIS — C7A00 Malignant carcinoid tumor of unspecified site: Secondary | ICD-10-CM | POA: Diagnosis not present

## 2018-07-22 DIAGNOSIS — C7B02 Secondary carcinoid tumors of liver: Secondary | ICD-10-CM | POA: Insufficient documentation

## 2018-07-22 MED ORDER — OCTREOTIDE ACETATE 30 MG IM KIT
30.0000 mg | PACK | Freq: Once | INTRAMUSCULAR | Status: AC
  Administered 2018-07-22: 30 mg via INTRAMUSCULAR

## 2018-07-22 NOTE — Progress Notes (Signed)
Allen Davenport tolerated Sandostatin injection well without complaints or incident. VSS Pt discharged self ambulatory in satisfactory condition

## 2018-07-22 NOTE — Patient Instructions (Signed)
[facility] at [facility] Discharge Instructions  Received Sandostatin injection today. Follow-up as scheduled. Call clinic for any questions or concerns   Thank you for choosing [facility] at [facility] to provide your oncology and hematology care.  To afford each patient quality time with our provider, please arrive at least 15 minutes before your scheduled appointment time.   If you have a lab appointment with the Cancer Center please come in thru the  Main Entrance and check in at the main information desk  You need to re-schedule your appointment should you arrive 10 or more minutes late.  We strive to give you quality time with our providers, and arriving late affects you and other patients whose appointments are after yours.  Also, if you no show three or more times for appointments you may be dismissed from the clinic at the providers discretion.     Again, thank you for choosing [facility].  Our hope is that these requests will decrease the amount of time that you wait before being seen by our physicians.       _____________________________________________________________  Should you have questions after your visit to [facility], please contact our office at [phone] between the hours of 8:00 a.m. and 4:30 p.m.  Voicemails left after 4:00 p.m. will not be returned until the following business day.  For prescription refill requests, have your pharmacy contact our office and allow 72 hours.    Cancer Center Support Programs:   > Cancer Support Group  2nd Tuesday of the month 1pm-2pm, Journey Room   

## 2018-07-23 DIAGNOSIS — N184 Chronic kidney disease, stage 4 (severe): Secondary | ICD-10-CM | POA: Diagnosis not present

## 2018-07-23 DIAGNOSIS — I10 Essential (primary) hypertension: Secondary | ICD-10-CM | POA: Diagnosis not present

## 2018-07-23 DIAGNOSIS — E7849 Other hyperlipidemia: Secondary | ICD-10-CM | POA: Diagnosis not present

## 2018-08-02 ENCOUNTER — Other Ambulatory Visit (HOSPITAL_COMMUNITY): Payer: Self-pay | Admitting: Pharmacist

## 2018-08-02 DIAGNOSIS — C7A019 Malignant carcinoid tumor of the small intestine, unspecified portion: Secondary | ICD-10-CM

## 2018-08-02 MED ORDER — EVEROLIMUS 5 MG PO TABS: 5.0000 mg | ORAL_TABLET | Freq: Every day | ORAL | 2 refills | Status: DC

## 2018-08-14 DIAGNOSIS — M818 Other osteoporosis without current pathological fracture: Secondary | ICD-10-CM | POA: Diagnosis not present

## 2018-08-14 DIAGNOSIS — N184 Chronic kidney disease, stage 4 (severe): Secondary | ICD-10-CM | POA: Diagnosis not present

## 2018-08-14 DIAGNOSIS — E7849 Other hyperlipidemia: Secondary | ICD-10-CM | POA: Diagnosis not present

## 2018-08-14 DIAGNOSIS — I10 Essential (primary) hypertension: Secondary | ICD-10-CM | POA: Diagnosis not present

## 2018-08-16 DIAGNOSIS — D509 Iron deficiency anemia, unspecified: Secondary | ICD-10-CM | POA: Diagnosis not present

## 2018-08-16 DIAGNOSIS — I129 Hypertensive chronic kidney disease with stage 1 through stage 4 chronic kidney disease, or unspecified chronic kidney disease: Secondary | ICD-10-CM | POA: Diagnosis not present

## 2018-08-16 DIAGNOSIS — Z79899 Other long term (current) drug therapy: Secondary | ICD-10-CM | POA: Diagnosis not present

## 2018-08-16 DIAGNOSIS — E559 Vitamin D deficiency, unspecified: Secondary | ICD-10-CM | POA: Diagnosis not present

## 2018-08-16 DIAGNOSIS — N183 Chronic kidney disease, stage 3 (moderate): Secondary | ICD-10-CM | POA: Diagnosis not present

## 2018-08-16 DIAGNOSIS — R809 Proteinuria, unspecified: Secondary | ICD-10-CM | POA: Diagnosis not present

## 2018-08-19 ENCOUNTER — Inpatient Hospital Stay (HOSPITAL_COMMUNITY): Payer: Medicare Other

## 2018-08-19 ENCOUNTER — Inpatient Hospital Stay (HOSPITAL_COMMUNITY): Payer: Medicare Other | Attending: Hematology

## 2018-08-19 ENCOUNTER — Inpatient Hospital Stay (HOSPITAL_BASED_OUTPATIENT_CLINIC_OR_DEPARTMENT_OTHER): Payer: Medicare Other | Admitting: Hematology

## 2018-08-19 ENCOUNTER — Encounter (HOSPITAL_COMMUNITY): Payer: Self-pay | Admitting: Hematology

## 2018-08-19 ENCOUNTER — Other Ambulatory Visit: Payer: Self-pay

## 2018-08-19 VITALS — BP 145/74 | HR 59

## 2018-08-19 VITALS — BP 146/80 | HR 67 | Temp 98.0°F | Resp 16 | Wt 212.0 lb

## 2018-08-19 DIAGNOSIS — Z8546 Personal history of malignant neoplasm of prostate: Secondary | ICD-10-CM

## 2018-08-19 DIAGNOSIS — N189 Chronic kidney disease, unspecified: Secondary | ICD-10-CM | POA: Diagnosis not present

## 2018-08-19 DIAGNOSIS — C7B02 Secondary carcinoid tumors of liver: Secondary | ICD-10-CM | POA: Insufficient documentation

## 2018-08-19 DIAGNOSIS — D631 Anemia in chronic kidney disease: Secondary | ICD-10-CM | POA: Diagnosis not present

## 2018-08-19 DIAGNOSIS — R5383 Other fatigue: Secondary | ICD-10-CM | POA: Diagnosis not present

## 2018-08-19 DIAGNOSIS — D509 Iron deficiency anemia, unspecified: Secondary | ICD-10-CM | POA: Insufficient documentation

## 2018-08-19 DIAGNOSIS — Z85528 Personal history of other malignant neoplasm of kidney: Secondary | ICD-10-CM | POA: Insufficient documentation

## 2018-08-19 DIAGNOSIS — Z8589 Personal history of malignant neoplasm of other organs and systems: Secondary | ICD-10-CM | POA: Insufficient documentation

## 2018-08-19 DIAGNOSIS — C7A00 Malignant carcinoid tumor of unspecified site: Secondary | ICD-10-CM

## 2018-08-19 LAB — CBC WITH DIFFERENTIAL/PLATELET
ABS IMMATURE GRANULOCYTES: 0.01 10*3/uL (ref 0.00–0.07)
Basophils Absolute: 0 10*3/uL (ref 0.0–0.1)
Basophils Relative: 0 %
Eosinophils Absolute: 0.2 10*3/uL (ref 0.0–0.5)
Eosinophils Relative: 4 %
HCT: 32 % — ABNORMAL LOW (ref 39.0–52.0)
HEMOGLOBIN: 9.7 g/dL — AB (ref 13.0–17.0)
Immature Granulocytes: 0 %
LYMPHS ABS: 0.5 10*3/uL — AB (ref 0.7–4.0)
LYMPHS PCT: 10 %
MCH: 27.7 pg (ref 26.0–34.0)
MCHC: 30.3 g/dL (ref 30.0–36.0)
MCV: 91.4 fL (ref 80.0–100.0)
Monocytes Absolute: 0.6 10*3/uL (ref 0.1–1.0)
Monocytes Relative: 12 %
Neutro Abs: 3.4 10*3/uL (ref 1.7–7.7)
Neutrophils Relative %: 74 %
Platelets: 120 10*3/uL — ABNORMAL LOW (ref 150–400)
RBC: 3.5 MIL/uL — ABNORMAL LOW (ref 4.22–5.81)
RDW: 14.5 % (ref 11.5–15.5)
WBC: 4.6 10*3/uL (ref 4.0–10.5)
nRBC: 0 % (ref 0.0–0.2)

## 2018-08-19 LAB — COMPREHENSIVE METABOLIC PANEL
ALBUMIN: 3.6 g/dL (ref 3.5–5.0)
ALT: 27 U/L (ref 0–44)
AST: 63 U/L — ABNORMAL HIGH (ref 15–41)
Alkaline Phosphatase: 58 U/L (ref 38–126)
Anion gap: 7 (ref 5–15)
BUN: 49 mg/dL — ABNORMAL HIGH (ref 8–23)
CO2: 25 mmol/L (ref 22–32)
CREATININE: 3.76 mg/dL — AB (ref 0.61–1.24)
Calcium: 8.3 mg/dL — ABNORMAL LOW (ref 8.9–10.3)
Chloride: 106 mmol/L (ref 98–111)
GFR calc Af Amer: 17 mL/min — ABNORMAL LOW (ref >60)
GFR calc non Af Amer: 15 mL/min — ABNORMAL LOW (ref >60)
GLUCOSE: 89 mg/dL (ref 70–99)
Potassium: 4 mmol/L (ref 3.5–5.1)
Sodium: 138 mmol/L (ref 135–145)
Total Bilirubin: 0.8 mg/dL (ref 0.3–1.2)
Total Protein: 6.3 g/dL — ABNORMAL LOW (ref 6.5–8.1)

## 2018-08-19 LAB — MAGNESIUM: Magnesium: 2.2 mg/dL (ref 1.7–2.4)

## 2018-08-19 LAB — FERRITIN: Ferritin: 215 ng/mL (ref 24–336)

## 2018-08-19 MED ORDER — OCTREOTIDE ACETATE 30 MG IM KIT
30.0000 mg | PACK | Freq: Once | INTRAMUSCULAR | Status: AC
  Administered 2018-08-19: 30 mg via INTRAMUSCULAR

## 2018-08-19 MED ORDER — OCTREOTIDE ACETATE 30 MG IM KIT
PACK | INTRAMUSCULAR | Status: AC
  Filled 2018-08-19: qty 1

## 2018-08-19 MED ORDER — SODIUM CHLORIDE 0.9 % IV SOLN
INTRAVENOUS | Status: DC
  Administered 2018-08-19: 12:00:00 via INTRAVENOUS

## 2018-08-19 MED ORDER — SODIUM CHLORIDE 0.9 % IV SOLN
510.0000 mg | Freq: Once | INTRAVENOUS | Status: AC
  Administered 2018-08-19: 510 mg via INTRAVENOUS
  Filled 2018-08-19: qty 17

## 2018-08-19 NOTE — Patient Instructions (Signed)
[facility] at [facility] Discharge Instructions     Thank you for choosing [facility] at [facility] to provide your oncology and hematology care.  To afford each patient quality time with our provider, please arrive at least 15 minutes before your scheduled appointment time.   If you have a lab appointment with the Cancer Center please come in thru the  Main Entrance and check in at the main information desk  You need to re-schedule your appointment should you arrive 10 or more minutes late.  We strive to give you quality time with our providers, and arriving late affects you and other patients whose appointments are after yours.  Also, if you no show three or more times for appointments you may be dismissed from the clinic at the providers discretion.     Again, thank you for choosing [facility].  Our hope is that these requests will decrease the amount of time that you wait before being seen by our physicians.       _____________________________________________________________  Should you have questions after your visit to [facility], please contact our office at [phone] between the hours of 8:00 a.m. and 4:30 p.m.  Voicemails left after 4:00 p.m. will not be returned until the following business day.  For prescription refill requests, have your pharmacy contact our office and allow 72 hours.    Cancer Center Support Programs:   > Cancer Support Group  2nd Tuesday of the month 1pm-2pm, Journey Room    

## 2018-08-19 NOTE — Progress Notes (Signed)
Sandostatin given today per orders. Iron given today per orders. Treatment given per orders. Patient tolerated it well without problems. Vitals stable and discharged home from clinic ambulatory. Follow up as scheduled.

## 2018-08-19 NOTE — Progress Notes (Signed)
Allen Davenport, [place] [address]   CLINIC:  Medical Oncology/Hematology  PCP:  [name], MD [place] [phone]   REASON FOR VISIT: Follow-up for metastatic carcinoid tumor  CURRENT THERAPY:Everolimus tabs daily andSandostatin monthly  INTERVAL HISTORY:  Allen Davenport 78 y.o. male returns for routine follow-up for metastatic carcinoid tumor.  He is here today and doing well since his last visit. He reports he is fatigued in the afternoon. Denies any nausea, vomiting, or diarrhea. Denies any new pains. Had not noticed any recent bleeding such as epistaxis, hematuria or hematochezia. Denies recent chest pain on exertion, shortness of breath on minimal exertion, pre-syncopal episodes, or palpitations. Denies any numbness or tingling in hands or feet. Denies any recent fevers, infections, or recent hospitalizations. Patient reports appetite at 100% and energy level at 75%.    REVIEW OF SYSTEMS:  Review of Systems  Constitutional: Positive for fatigue.  All other systems reviewed and are negative.    PAST MEDICAL/SURGICAL HISTORY:  Past Medical History:  Diagnosis Date  . Anemia   . Cancer [facility])    right renal . Prostate- Radiation treatment  . CHF (congestive heart failure) ([place])   . Chronic kidney disease    stage 4  . Edema   . Heart murmur    "nothing to worry about"  . Hyperlipidemia   . Hypertension   . Malignant carcinoid tumor ([place]) 01/03/2016   Past Surgical History:  Procedure Laterality Date  . AV FISTULA PLACEMENT Left 10/20/2014   Procedure: Left Arm ARTERIOVENOUS (AV) FISTULA CREATION;  Surgeon: Elam Dutch, MD;  Location: [facility];  Service: Vascular;  Laterality: Left;  . NEPHRECTOMY Right 2010     SOCIAL HISTORY:  Social History   Socioeconomic History  . Marital status: Married    Spouse name: Not on file  . Number of children: Not on file  . Years of education: Not on file   . Highest education level: Not on file  Occupational History  . Not on file  Social Needs  . Financial resource strain: Not on file  . Food insecurity:    Worry: Not on file    Inability: Not on file  . Transportation needs:    Medical: Not on file    Non-medical: Not on file  Tobacco Use  . Smoking status: Never Smoker  . Smokeless tobacco: Never Used  Substance and Sexual Activity  . Alcohol use: No    Alcohol/week: 0.0 standard drinks  . Drug use: No  . Sexual activity: Not on file  Lifestyle  . Physical activity:    Days per week: Not on file    Minutes per session: Not on file  . Stress: Not on file  Relationships  . Social connections:    Talks on phone: Not on file    Gets together: Not on file    Attends religious service: Not on file    Active member of club or organization: Not on file    Attends meetings of clubs or organizations: Not on file    Relationship status: Not on file  . Intimate partner violence:    Fear of current or ex partner: Not on file    Emotionally abused: Not on file    Physically abused: Not on file    Forced sexual activity: Not on file  Other Topics Concern  . Not on file  Social History Narrative  .  Not on file    FAMILY HISTORY:  Family History  Problem Relation Age of Onset  . Cancer Mother   . Hypertension Father   . Cancer Sister     CURRENT MEDICATIONS:  Outpatient Encounter Medications as of 08/19/2018  Medication Sig Note  . alendronate (FOSAMAX) 70 MG tablet TAKE ONE TABLET BY MOUTH ONCE A WEEK IN THE MORNING WITH A FULL GLASS OF WATER ON AN EMPTY STOMACH. DO NOT LAY DOWN FOR 30 MINUTES. 07/19/2016: Received from: External Pharmacy  . amLODipine (NORVASC) 10 MG tablet Take by mouth.   [name] atorvastatin (LIPITOR) 80 MG tablet    . bisoprolol-hydrochlorothiazide (ZIAC) 10-6.25 MG tablet    . calcitRIOL (ROCALTROL) 0.25 MCG capsule  04/26/2016: Received from: External Pharmacy  . carvedilol (COREG) 3.125 MG tablet Take by  mouth.   . cholecalciferol (VITAMIN D) 1000 UNITS tablet Take 1,000 Units by mouth daily.   [name] dexamethasone (DECADRON) 0.5 MG/5ML solution Swish 73mL by mouth four times daily for 2 minutes then spit.   [name] everolimus (AFINITOR) 5 MG tablet Take 1 tablet (5 mg total) by mouth daily.   [name] FERREX 150 150 MG capsule Take 150 mg by mouth 2 (two) times daily.   . furosemide (LASIX) 40 MG tablet Take 40 mg by mouth.   . labetalol (NORMODYNE) 300 MG tablet Take 300 mg by mouth 2 (two) times daily.   [name] octreotide (SANDOSTATIN LAR DEPOT) 30 MG injection Inject into the muscle.   . Omega-3 Fatty Acids (FISH OIL) 1000 MG CAPS Take 1 capsule by mouth daily.   . potassium chloride SA (K-DUR,KLOR-CON) 20 MEQ tablet    . sodium bicarbonate 650 MG tablet Take 650 mg by mouth daily.   . tamsulosin (FLOMAX) 0.4 MG CAPS capsule  04/26/2016: Received from: External Pharmacy  . hydrALAZINE (APRESOLINE) 25 MG tablet TAKE THREE (3) TABLETS BY MOUTH TWICE DAILY 07/19/2016: Received from: External Pharmacy   Facility-Administered Encounter Medications as of 08/19/2018  Medication  . octreotide (SANDOSTATIN LAR) 30 MG IM injection    ALLERGIES:  No Known Allergies   PHYSICAL EXAM:  ECOG Performance status: 1  Vitals:   08/19/18 1000  BP: (!) 146/80  Pulse: 67  Resp: 16  Temp: 98 F (36.7 C)  SpO2: 100%   Filed Weights   08/19/18 1000  Weight: 212 lb (96.2 kg)    Physical Exam Constitutional:      Appearance: Normal appearance. He is normal weight.  Cardiovascular:     Rate and Rhythm: Normal rate and regular rhythm.     Heart sounds: Normal heart sounds.  Pulmonary:     Effort: Pulmonary effort is normal.     Breath sounds: Normal breath sounds.  Abdominal:     General: Abdomen is flat.     Palpations: Abdomen is soft.  Musculoskeletal: Normal range of motion.  Skin:    General: Skin is warm and dry.  Neurological:     Mental Status: He is alert and oriented to person, place, and time. Mental  status is at baseline.  Psychiatric:        Mood and Affect: Mood normal.        Behavior: Behavior normal.        Thought Content: Thought content normal.        Judgment: Judgment normal.      LABORATORY DATA:  I have reviewed the labs as listed.  CBC    Component Value Date/Time   WBC 4.6 08/19/2018  0944   RBC 3.50 (L) 08/19/2018 0944   HGB 9.7 (L) 08/19/2018 0944   HCT 32.0 (L) 08/19/2018 0944   PLT 120 (L) 08/19/2018 0944   MCV 91.4 08/19/2018 0944   MCH 27.7 08/19/2018 0944   MCHC 30.3 08/19/2018 0944   RDW 14.5 08/19/2018 0944   LYMPHSABS 0.5 (L) 08/19/2018 0944   MONOABS 0.6 08/19/2018 0944   EOSABS 0.2 08/19/2018 0944   BASOSABS 0.0 08/19/2018 0944   CMP Latest Ref Rng & Units 08/19/2018 07/05/2018 06/24/2018  Glucose 70 - 99 mg/dL 89 98 103(H)  BUN 8 - 23 mg/dL 49(H) 41(H) 46(H)  Creatinine 0.61 - 1.24 mg/dL 3.76(H) 3.55(H) 3.61(H)  Sodium 135 - 145 mmol/L 138 139 141  Potassium 3.5 - 5.1 mmol/L 4.0 4.4 4.3  Chloride 98 - 111 mmol/L 106 107 112(H)  CO2 22 - 32 mmol/L 25 24 23   Calcium 8.9 - 10.3 mg/dL 8.3(L) 8.8(L) 8.3(L)  Total Protein 6.5 - 8.1 g/dL 6.3(L) 6.2(L) 6.5  Total Bilirubin 0.3 - 1.2 mg/dL 0.8 0.6 0.8  Alkaline Phos 38 - 126 U/L 58 71 63  AST 15 - 41 U/L 63(H) 51(H) 58(H)  ALT 0 - 44 U/L 27 26 27        DIAGNOSTIC IMAGING:  I have independently reviewed the scans and discussed with the patient.   I have reviewed Francene Finders, NP's note and agree with the documentation.  I personally performed a face-to-face visit, made revisions and my assessment and plan is as follows.    ASSESSMENT & PLAN:   Malignant carcinoid tumor ([place]) 1.  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  Initially managed at [facility].  Transferred care to [facility] in October 2017. - Gallium-68 scan on 10/23/2017 shows metastatic disease in the liver, central right mesenteric mass and multiple  peritoneal deposits. - He does not have any abdominal pains or symptoms of carcinoid syndrome. -Gallium-68 dotatate PET scan on 04/22/2018 showed multiple foci of intense radiotracer activity consistent with metastatic well-differentiated carcinoid tumor.  Metastatic sites include central mesentery, multiple peritoneal implants, liver metastasis.  Several metastatic lesions have measured higher activity.  Additional mild increase in size of central mesenteric mass.  Lesion within the right ventricle along the interventricular septum thought to be metastatic. - 2D echo on 05/03/2018 showed normal cavity size of the right ventricular normal systolic function. -Everolimus 5 mg daily started on 05/17/2018 along with dexamethasone mouthwash. - He is tolerating everolimus very well.  Denies any abdominal pains or symptoms of carcinoid syndrome. -He is also continuing octreotide monthly injections. - Last chromogranin level was 25 on 07/05/2018.  He will continue everolimus at this time. -I will see him back in 4 weeks for follow-up.  I plan to repeat gallium-68 dotatate PET CT scan 4 to 6 months from the start of everolimus.  2.  Normocytic anemia: -Combination anemia from CKD, iron deficiency and bone marrow suppression from everolimus.   -Last Feraheme infusion was on 11/08/2017. -His hemoglobin dropped to 9.7 today.  I have recommended weekly Feraheme x2.  3.  Head and neck cancer: Status post surgery and radiation therapy in 2009.  He is in remission.  4.  Kidney cancer: Status post right nephrectomy in May 2010 at [facility].  5.  Prostate cancer: Status post seed implant by Dr. Tammi Klippel in 2016.  He follows up with his urologist.      Orders  placed this encounter:  Orders Placed This Encounter  Procedures  . Ferritin  . Chromogranin A  . CBC with Differential/Platelet  . Comprehensive metabolic panel  . Ferritin  . Iron and TIBC  . Vitamin B12  . Folate  . Serotonin serum        Derek Jack, MD [place] [phone]

## 2018-08-19 NOTE — Assessment & Plan Note (Addendum)
1.  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  Initially managed at [facility].  Transferred care to [facility] in October 2017. - Gallium-68 scan on 10/23/2017 shows metastatic disease in the liver, central right mesenteric mass and multiple peritoneal deposits. - He does not have any abdominal pains or symptoms of carcinoid syndrome. -Gallium-68 dotatate PET scan on 04/22/2018 showed multiple foci of intense radiotracer activity consistent with metastatic well-differentiated carcinoid tumor.  Metastatic sites include central mesentery, multiple peritoneal implants, liver metastasis.  Several metastatic lesions have measured higher activity.  Additional mild increase in size of central mesenteric mass.  Lesion within the right ventricle along the interventricular septum thought to be metastatic. - 2D echo on 05/03/2018 showed normal cavity size of the right ventricular normal systolic function. -Everolimus 5 mg daily started on 05/17/2018 along with dexamethasone mouthwash. - He is tolerating everolimus very well.  Denies any abdominal pains or symptoms of carcinoid syndrome. -He is also continuing octreotide monthly injections. - Last chromogranin level was 25 on 07/05/2018.  He will continue everolimus at this time. -I will see him back in 4 weeks for follow-up.  I plan to repeat gallium-68 dotatate PET CT scan 4 to 6 months from the start of everolimus.  2.  Normocytic anemia: -Combination anemia from CKD, iron deficiency and bone marrow suppression from everolimus.   -Last Feraheme infusion was on 11/08/2017. -His hemoglobin dropped to 9.7 today.  I have recommended weekly Feraheme x2.  3.  Head and neck cancer: Status post surgery and radiation therapy in 2009.  He is in remission.  4.  Kidney cancer: Status post right nephrectomy in May 2010 at [facility].  5.  Prostate cancer: Status post seed implant by Dr.  Tammi Klippel in 2016.  He follows up with his urologist.

## 2018-08-20 LAB — CHROMOGRANIN A: Chromogranin A (ng/mL): 443.5 ng/mL — ABNORMAL HIGH (ref 0.0–101.8)

## 2018-08-23 ENCOUNTER — Telehealth (HOSPITAL_COMMUNITY): Payer: Self-pay | Admitting: Pharmacy Technician

## 2018-08-23 NOTE — Telephone Encounter (Signed)
Oral Oncology Patient Advocate Encounter  Received notification from [employer] ([employer]) that patient has been successfully enrolled into their program to receive Afinitor from the manufacturer at $0 out of pocket until 07/17/2019.    I called and spoke with patient.  He knows we will have to re-apply.   Patient knows to call the office with questions or concerns.   Oral Oncology Clinic will continue to follow.  [place] Patient [place] Phone [phone] Fax [phone] 08/23/2018 9:59 AM

## 2018-08-26 ENCOUNTER — Encounter (HOSPITAL_COMMUNITY): Payer: Self-pay

## 2018-08-26 ENCOUNTER — Other Ambulatory Visit: Payer: Self-pay

## 2018-08-26 ENCOUNTER — Inpatient Hospital Stay (HOSPITAL_COMMUNITY): Payer: Medicare Other

## 2018-08-26 VITALS — BP 125/66 | HR 63 | Temp 98.1°F | Resp 18

## 2018-08-26 DIAGNOSIS — C7B02 Secondary carcinoid tumors of liver: Secondary | ICD-10-CM | POA: Diagnosis not present

## 2018-08-26 DIAGNOSIS — C7A00 Malignant carcinoid tumor of unspecified site: Secondary | ICD-10-CM | POA: Diagnosis not present

## 2018-08-26 DIAGNOSIS — N189 Chronic kidney disease, unspecified: Secondary | ICD-10-CM | POA: Diagnosis not present

## 2018-08-26 DIAGNOSIS — R5383 Other fatigue: Secondary | ICD-10-CM | POA: Diagnosis not present

## 2018-08-26 DIAGNOSIS — D509 Iron deficiency anemia, unspecified: Secondary | ICD-10-CM | POA: Diagnosis not present

## 2018-08-26 DIAGNOSIS — D631 Anemia in chronic kidney disease: Secondary | ICD-10-CM | POA: Diagnosis not present

## 2018-08-26 MED ORDER — SODIUM CHLORIDE 0.9 % IV SOLN
510.0000 mg | Freq: Once | INTRAVENOUS | Status: AC
  Administered 2018-08-26: 510 mg via INTRAVENOUS
  Filled 2018-08-26: qty 510

## 2018-08-26 MED ORDER — SODIUM CHLORIDE 0.9 % IV SOLN
INTRAVENOUS | Status: DC
  Administered 2018-08-26: 09:00:00 via INTRAVENOUS

## 2018-08-26 NOTE — Progress Notes (Signed)
Allen Davenport tolerated Feraheme infusion well without complaints or incident. VSS upon discharge. Pt discharged self ambulatory in satisfactory condition

## 2018-08-26 NOTE — Patient Instructions (Signed)
[facility] at [facility] Discharge Instructions  Received Feraheme infusion today. Follow-up as scheduled. Call clinic for any questions or concerns   Thank you for choosing [facility] at [facility] to provide your oncology and hematology care.  To afford each patient quality time with our provider, please arrive at least 15 minutes before your scheduled appointment time.   If you have a lab appointment with the Cancer Center please come in thru the  Main Entrance and check in at the main information desk  You need to re-schedule your appointment should you arrive 10 or more minutes late.  We strive to give you quality time with our providers, and arriving late affects you and other patients whose appointments are after yours.  Also, if you no show three or more times for appointments you may be dismissed from the clinic at the providers discretion.     Again, thank you for choosing [facility].  Our hope is that these requests will decrease the amount of time that you wait before being seen by our physicians.       _____________________________________________________________  Should you have questions after your visit to [facility], please contact our office at [phone] between the hours of 8:00 a.m. and 4:30 p.m.  Voicemails left after 4:00 p.m. will not be returned until the following business day.  For prescription refill requests, have your pharmacy contact our office and allow 72 hours.    Cancer Center Support Programs:   > Cancer Support Group  2nd Tuesday of the month 1pm-2pm, Journey Room   

## 2018-08-27 DIAGNOSIS — C641 Malignant neoplasm of right kidney, except renal pelvis: Secondary | ICD-10-CM | POA: Diagnosis not present

## 2018-08-27 DIAGNOSIS — N184 Chronic kidney disease, stage 4 (severe): Secondary | ICD-10-CM | POA: Diagnosis not present

## 2018-08-27 DIAGNOSIS — I10 Essential (primary) hypertension: Secondary | ICD-10-CM | POA: Diagnosis not present

## 2018-08-27 DIAGNOSIS — D638 Anemia in other chronic diseases classified elsewhere: Secondary | ICD-10-CM | POA: Diagnosis not present

## 2018-08-28 DIAGNOSIS — E1140 Type 2 diabetes mellitus with diabetic neuropathy, unspecified: Secondary | ICD-10-CM | POA: Diagnosis not present

## 2018-08-28 DIAGNOSIS — E1151 Type 2 diabetes mellitus with diabetic peripheral angiopathy without gangrene: Secondary | ICD-10-CM | POA: Diagnosis not present

## 2018-09-17 ENCOUNTER — Encounter (HOSPITAL_COMMUNITY): Payer: Self-pay

## 2018-09-17 ENCOUNTER — Inpatient Hospital Stay (HOSPITAL_COMMUNITY): Payer: Medicare Other | Attending: Oncology

## 2018-09-17 ENCOUNTER — Other Ambulatory Visit: Payer: Self-pay

## 2018-09-17 VITALS — BP 129/71 | HR 70 | Temp 98.0°F | Resp 18

## 2018-09-17 DIAGNOSIS — C7B02 Secondary carcinoid tumors of liver: Secondary | ICD-10-CM | POA: Insufficient documentation

## 2018-09-17 DIAGNOSIS — N189 Chronic kidney disease, unspecified: Secondary | ICD-10-CM | POA: Insufficient documentation

## 2018-09-17 DIAGNOSIS — C7A00 Malignant carcinoid tumor of unspecified site: Secondary | ICD-10-CM | POA: Diagnosis not present

## 2018-09-17 DIAGNOSIS — D631 Anemia in chronic kidney disease: Secondary | ICD-10-CM | POA: Diagnosis not present

## 2018-09-17 DIAGNOSIS — I10 Essential (primary) hypertension: Secondary | ICD-10-CM | POA: Diagnosis not present

## 2018-09-17 DIAGNOSIS — N184 Chronic kidney disease, stage 4 (severe): Secondary | ICD-10-CM | POA: Diagnosis not present

## 2018-09-17 DIAGNOSIS — E7849 Other hyperlipidemia: Secondary | ICD-10-CM | POA: Diagnosis not present

## 2018-09-17 MED ORDER — OCTREOTIDE ACETATE 30 MG IM KIT
30.0000 mg | PACK | Freq: Once | INTRAMUSCULAR | Status: AC
  Administered 2018-09-17: 30 mg via INTRAMUSCULAR

## 2018-09-17 NOTE — Patient Instructions (Signed)
[facility] at [facility] Discharge Instructions  Received Sandostatin injection today. Follow-up as scheduled. Call clinic for any questions or concerns   Thank you for choosing [facility] at [facility] to provide your oncology and hematology care.  To afford each patient quality time with our provider, please arrive at least 15 minutes before your scheduled appointment time.   If you have a lab appointment with the Cancer Center please come in thru the  Main Entrance and check in at the main information desk  You need to re-schedule your appointment should you arrive 10 or more minutes late.  We strive to give you quality time with our providers, and arriving late affects you and other patients whose appointments are after yours.  Also, if you no show three or more times for appointments you may be dismissed from the clinic at the providers discretion.     Again, thank you for choosing [facility].  Our hope is that these requests will decrease the amount of time that you wait before being seen by our physicians.       _____________________________________________________________  Should you have questions after your visit to [facility], please contact our office at [phone] between the hours of 8:00 a.m. and 4:30 p.m.  Voicemails left after 4:00 p.m. will not be returned until the following business day.  For prescription refill requests, have your pharmacy contact our office and allow 72 hours.    Cancer Center Support Programs:   > Cancer Support Group  2nd Tuesday of the month 1pm-2pm, Journey Room   

## 2018-09-17 NOTE — Progress Notes (Signed)
Allen Davenport tolerated Sandostatin injection well without complaints or incident. VSS Pt discharged self ambulatory in satisfactory condition

## 2018-09-21 DIAGNOSIS — J309 Allergic rhinitis, unspecified: Secondary | ICD-10-CM | POA: Diagnosis not present

## 2018-09-21 DIAGNOSIS — R0989 Other specified symptoms and signs involving the circulatory and respiratory systems: Secondary | ICD-10-CM | POA: Diagnosis not present

## 2018-09-21 DIAGNOSIS — J302 Other seasonal allergic rhinitis: Secondary | ICD-10-CM | POA: Diagnosis not present

## 2018-09-26 ENCOUNTER — Encounter (HOSPITAL_COMMUNITY): Payer: Self-pay | Admitting: Hematology

## 2018-09-26 ENCOUNTER — Inpatient Hospital Stay (HOSPITAL_COMMUNITY): Payer: Medicare Other

## 2018-09-26 ENCOUNTER — Inpatient Hospital Stay (HOSPITAL_COMMUNITY): Payer: Medicare Other | Attending: Hematology

## 2018-09-26 ENCOUNTER — Inpatient Hospital Stay (HOSPITAL_BASED_OUTPATIENT_CLINIC_OR_DEPARTMENT_OTHER): Payer: Medicare Other | Admitting: Hematology

## 2018-09-26 ENCOUNTER — Other Ambulatory Visit: Payer: Self-pay

## 2018-09-26 VITALS — BP 133/70 | HR 73 | Temp 98.6°F | Resp 18 | Wt 210.2 lb

## 2018-09-26 DIAGNOSIS — C7A00 Malignant carcinoid tumor of unspecified site: Secondary | ICD-10-CM

## 2018-09-26 DIAGNOSIS — N189 Chronic kidney disease, unspecified: Secondary | ICD-10-CM

## 2018-09-26 DIAGNOSIS — Z85528 Personal history of other malignant neoplasm of kidney: Secondary | ICD-10-CM

## 2018-09-26 DIAGNOSIS — C7B02 Secondary carcinoid tumors of liver: Secondary | ICD-10-CM

## 2018-09-26 DIAGNOSIS — D631 Anemia in chronic kidney disease: Secondary | ICD-10-CM

## 2018-09-26 DIAGNOSIS — D509 Iron deficiency anemia, unspecified: Secondary | ICD-10-CM

## 2018-09-26 DIAGNOSIS — Z8546 Personal history of malignant neoplasm of prostate: Secondary | ICD-10-CM

## 2018-09-26 DIAGNOSIS — Z8589 Personal history of malignant neoplasm of other organs and systems: Secondary | ICD-10-CM | POA: Diagnosis not present

## 2018-09-26 LAB — CBC WITH DIFFERENTIAL/PLATELET
Abs Immature Granulocytes: 0.03 10*3/uL (ref 0.00–0.07)
Basophils Absolute: 0 10*3/uL (ref 0.0–0.1)
Basophils Relative: 1 %
EOS PCT: 2 %
Eosinophils Absolute: 0.1 10*3/uL (ref 0.0–0.5)
HEMATOCRIT: 28.1 % — AB (ref 39.0–52.0)
HEMOGLOBIN: 8.8 g/dL — AB (ref 13.0–17.0)
Immature Granulocytes: 1 %
LYMPHS PCT: 6 %
Lymphs Abs: 0.4 10*3/uL — ABNORMAL LOW (ref 0.7–4.0)
MCH: 27.6 pg (ref 26.0–34.0)
MCHC: 31.3 g/dL (ref 30.0–36.0)
MCV: 88.1 fL (ref 80.0–100.0)
Monocytes Absolute: 0.4 10*3/uL (ref 0.1–1.0)
Monocytes Relative: 8 %
Neutro Abs: 4.8 10*3/uL (ref 1.7–7.7)
Neutrophils Relative %: 82 %
Platelets: 144 10*3/uL — ABNORMAL LOW (ref 150–400)
RBC: 3.19 MIL/uL — ABNORMAL LOW (ref 4.22–5.81)
RDW: 13.5 % (ref 11.5–15.5)
WBC: 5.8 10*3/uL (ref 4.0–10.5)
nRBC: 0 % (ref 0.0–0.2)

## 2018-09-26 LAB — COMPREHENSIVE METABOLIC PANEL
ALK PHOS: 70 U/L (ref 38–126)
ALT: 28 U/L (ref 0–44)
AST: 51 U/L — ABNORMAL HIGH (ref 15–41)
Albumin: 3 g/dL — ABNORMAL LOW (ref 3.5–5.0)
Anion gap: 10 (ref 5–15)
BUN: 50 mg/dL — ABNORMAL HIGH (ref 8–23)
CALCIUM: 8.1 mg/dL — AB (ref 8.9–10.3)
CO2: 22 mmol/L (ref 22–32)
Chloride: 104 mmol/L (ref 98–111)
Creatinine, Ser: 4.02 mg/dL — ABNORMAL HIGH (ref 0.61–1.24)
GFR calc Af Amer: 16 mL/min — ABNORMAL LOW (ref >60)
GFR calc non Af Amer: 13 mL/min — ABNORMAL LOW (ref >60)
GLUCOSE: 183 mg/dL — AB (ref 70–99)
Potassium: 3.7 mmol/L (ref 3.5–5.1)
Sodium: 136 mmol/L (ref 135–145)
Total Bilirubin: 0.5 mg/dL (ref 0.3–1.2)
Total Protein: 6.2 g/dL — ABNORMAL LOW (ref 6.5–8.1)

## 2018-09-26 LAB — IRON AND TIBC
Iron: 20 ug/dL — ABNORMAL LOW (ref 45–182)
Saturation Ratios: 13 % — ABNORMAL LOW (ref 17.9–39.5)
TIBC: 159 ug/dL — ABNORMAL LOW (ref 250–450)
UIBC: 139 ug/dL

## 2018-09-26 LAB — VITAMIN B12: Vitamin B-12: 608 pg/mL (ref 180–914)

## 2018-09-26 LAB — FERRITIN: Ferritin: 736 ng/mL — ABNORMAL HIGH (ref 24–336)

## 2018-09-26 LAB — FOLATE: FOLATE: 9.7 ng/mL (ref >5.9)

## 2018-09-26 MED ORDER — EPOETIN ALFA 20000 UNIT/ML IJ SOLN
20000.0000 [IU] | INTRAMUSCULAR | Status: DC
  Administered 2018-09-26: 20000 [IU] via SUBCUTANEOUS

## 2018-09-26 MED ORDER — EPOETIN ALFA 20000 UNIT/ML IJ SOLN
INTRAMUSCULAR | Status: AC
  Filled 2018-09-26: qty 1

## 2018-09-26 NOTE — Assessment & Plan Note (Signed)
1.  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  Initially managed at [facility].  Transferred care to [facility] in October 2017. - Gallium-68 scan on 10/23/2017 shows metastatic disease in the liver, central right mesenteric mass and multiple peritoneal deposits. -Gallium-68 dotatate PET scan on 04/22/2018 showed multiple foci of intense radiotracer activity consistent with metastatic well-differentiated carcinoid tumor.  Metastatic sites include central mesentery, multiple peritoneal implants, liver metastasis.  Several metastatic lesions have measured higher activity.  Additional mild increase in size of central mesenteric mass.  Lesion within the right ventricle along the interventricular septum thought to be metastatic. - 2D echo on 05/03/2018 showed normal cavity size of the right ventricle with normal systolic function.  -Everolimus 5 mg daily started on 05/17/2018, along with dexamethasone mouthwash. -He denies any symptoms of carcinoid syndrome including flushing, wheezing or diarrhea.  Denies any abdominal pains. -Recently had upper respiratory infection which is being managed by his primary doctor. -He will continue monthly octreotide injections.  His last serum chromogranin level went up. - We will continue everolimus at the same dose for now.  I will reevaluate him in 4 weeks with repeat blood work. -I plan to repeat gallium-68 dotatate PET CT scan in May of this year.    2.  Normocytic anemia: -Combination anemia from CKD, iron deficiency and bone marrow suppression from everolimus. -He received Feraheme infusion on 08/19/2018 and 08/26/2018. -Hemoglobin today is 8.8.  Denies any bleeding. -I have recommended starting him on Procrit 20,000 units every 14 days and titrated up as needed.  We discussed the side effects in detail.  3.  Head and neck cancer: Status post surgery and radiation therapy in 2009.  He is in  remission.  4.  Kidney cancer: Status post right nephrectomy in May 2010 at [facility].  5.  Prostate cancer: Status post seed implant by Dr. Tammi Klippel in 2016.  He follows up with his urologist.

## 2018-09-26 NOTE — Progress Notes (Signed)
Patient to receive procrit injection today verbal order Dr. Delton Coombes.  Vital signs checked and no complaints voiced by the patient.  No s/s of distress noted.   Patient tolerated injection with no complaints voiced.  Site clean and dry with no bruising or swelling noted at site.  Band aid applied.  Vss with discharge and left ambulatory with no s/s of distress noted.

## 2018-09-26 NOTE — Patient Instructions (Signed)
[facility] at [facility]  Discharge Instructions:   _______________________________________________________________  Thank you for choosing [facility] at [facility] to provide your oncology and hematology care.  To afford each patient quality time with our providers, please arrive at least 15 minutes before your scheduled appointment.  You need to re-schedule your appointment if you arrive 10 or more minutes late.  We strive to give you quality time with our providers, and arriving late affects you and other patients whose appointments are after yours.  Also, if you no show three or more times for appointments you may be dismissed from the clinic.  Again, thank you for choosing [facility]. Our hope is that these requests will allow you access to exceptional care and in a timely manner. _______________________________________________________________  If you have questions after your visit, please contact our office at [phone] between the hours of 8:30 a.m. and 5:00 p.m. Voicemails left after 4:30 p.m. will not be returned until the following business day. _______________________________________________________________  For prescription refill requests, have your pharmacy contact our office. _______________________________________________________________  Recommendations made by the consultant and any test results will be sent to your referring physician. _______________________________________________________________ 

## 2018-09-26 NOTE — Patient Instructions (Addendum)
[place] at [facility] Discharge Instructions  You were seen today by Dr. Delton Coombes. He went over your recent test results. He will see you back in 4 weeks  for labs and follow up.   Thank you for choosing [place] at [facility] to provide your oncology and hematology care.  To afford each patient quality time with our provider, please arrive at least 15 minutes before your scheduled appointment time.   If you have a lab appointment with the [place] please come in thru the  Main Entrance and check in at the main information desk  You need to re-schedule your appointment should you arrive 10 or more minutes late.  We strive to give you quality time with our providers, and arriving late affects you and other patients whose appointments are after yours.  Also, if you no show three or more times for appointments you may be dismissed from the clinic at the providers discretion.     Again, thank you for choosing [facility].  Our hope is that these requests will decrease the amount of time that you wait before being seen by our physicians.       _____________________________________________________________  Should you have questions after your visit to [facility], please contact our office at [phone] between the hours of 8:00 a.m. and 4:30 p.m.  Voicemails left after 4:00 p.m. will not be returned until the following business day.  For prescription refill requests, have your pharmacy contact our office and allow 72 hours.    Cancer Center Support Programs:   > Cancer Support Group  2nd Tuesday of the month 1pm-2pm, Journey Room

## 2018-09-26 NOTE — Progress Notes (Signed)
[place], [place] [address]   CLINIC:  Medical Oncology/Hematology  PCP:  [name], MD [place] [phone]   REASON FOR VISIT:  Follow-up for metastatic carcinoid tumor  CURRENT THERAPY:Everolimus tabs daily andSandostatin monthly   INTERVAL HISTORY:  Allen Davenport 78 y.o. male returns for routine follow-up. He is here today by himself. He states that he is taking his cancer pill as prescribed. He states that he does not have any mouth sores anymore. Denies any nausea, vomiting, or diarrhea. Denies any new pains. Had not noticed any recent bleeding such as epistaxis, hematuria or hematochezia. Denies recent chest pain on exertion, shortness of breath on minimal exertion, pre-syncopal episodes, or palpitations. Denies any numbness or tingling in hands or feet. Denies any recent fevers, infections, or recent hospitalizations. Patient reports appetite at 75 % and energy level at 75%.   REVIEW OF SYSTEMS:  Review of Systems  Respiratory: Positive for cough.   All other systems reviewed and are negative.    PAST MEDICAL/SURGICAL HISTORY:  Past Medical History:  Diagnosis Date  . Anemia   . Cancer [facility])    right renal . Prostate- Radiation treatment  . CHF (congestive heart failure) ([place])   . Chronic kidney disease    stage 4  . Edema   . Heart murmur    "nothing to worry about"  . Hyperlipidemia   . Hypertension   . Malignant carcinoid tumor ([place]) 01/03/2016   Past Surgical History:  Procedure Laterality Date  . AV FISTULA PLACEMENT Left 10/20/2014   Procedure: Left Arm ARTERIOVENOUS (AV) FISTULA CREATION;  Surgeon: Elam Dutch, MD;  Location: [place];  Service: Vascular;  Laterality: Left;  . NEPHRECTOMY Right 2010     SOCIAL HISTORY:  Social History   Socioeconomic History  . Marital status: Married    Spouse name: Not on file  . Number of children: Not on file  . Years of  education: Not on file  . Highest education level: Not on file  Occupational History  . Not on file  Social Needs  . Financial resource strain: Not on file  . Food insecurity:    Worry: Not on file    Inability: Not on file  . Transportation needs:    Medical: Not on file    Non-medical: Not on file  Tobacco Use  . Smoking status: Never Smoker  . Smokeless tobacco: Never Used  Substance and Sexual Activity  . Alcohol use: No    Alcohol/week: 0.0 standard drinks  . Drug use: No  . Sexual activity: Not on file  Lifestyle  . Physical activity:    Days per week: Not on file    Minutes per session: Not on file  . Stress: Not on file  Relationships  . Social connections:    Talks on phone: Not on file    Gets together: Not on file    Attends religious service: Not on file    Active member of club or organization: Not on file    Attends meetings of clubs or organizations: Not on file    Relationship status: Not on file  . Intimate partner violence:    Fear of current or ex partner: Not on file    Emotionally abused: Not on file    Physically abused: Not on file    Forced sexual activity: Not on file  Other Topics Concern  . Not  on file  Social History Narrative  . Not on file    FAMILY HISTORY:  Family History  Problem Relation Age of Onset  . Cancer Mother   . Hypertension Father   . Cancer Sister     CURRENT MEDICATIONS:  Outpatient Encounter Medications as of 09/26/2018  Medication Sig Note  . alendronate (FOSAMAX) 70 MG tablet TAKE ONE TABLET BY MOUTH ONCE A WEEK IN THE MORNING WITH A FULL GLASS OF WATER ON AN EMPTY STOMACH. DO NOT LAY DOWN FOR 30 MINUTES. 07/19/2016: Received from: External Pharmacy  . amLODipine (NORVASC) 10 MG tablet Take 10 mg by mouth daily.    [name] atorvastatin (LIPITOR) 80 MG tablet Take 80 mg by mouth daily at 6 PM.    . bisoprolol-hydrochlorothiazide (ZIAC) 10-6.25 MG tablet Take 1 tablet by mouth daily.    . calcitRIOL (ROCALTROL) 0.25 MCG  capsule Take 0.25 mcg by mouth daily.  04/26/2016: Received from: External Pharmacy  . calcium acetate (PHOSLO) 667 MG capsule Take 667 mg by mouth 3 (three) times daily with meals.    . calcium carbonate (OS-CAL - DOSED IN MG OF ELEMENTAL CALCIUM) 1250 (500 Ca) MG tablet Take 1 tablet by mouth daily with breakfast.    . carvedilol (COREG) 3.125 MG tablet Take 3.125 mg by mouth 2 (two) times daily with a meal.    . cholecalciferol (VITAMIN D) 1000 UNITS tablet Take 1,000 Units by mouth daily.   [name] dexamethasone (DECADRON) 0.5 MG/5ML solution Swish 79mL by mouth four times daily for 2 minutes then spit. 09/26/2018: Patient is using this once a day.  . everolimus (AFINITOR) 5 MG tablet Take 1 tablet (5 mg total) by mouth daily.   [name] FERREX 150 150 MG capsule Take 150 mg by mouth 2 (two) times daily.   . furosemide (LASIX) 40 MG tablet Take 40 mg by mouth.   . hydrALAZINE (APRESOLINE) 25 MG tablet TAKE THREE (3) TABLETS BY MOUTH TWICE DAILY 07/19/2016: Received from: External Pharmacy  . labetalol (NORMODYNE) 300 MG tablet Take 300 mg by mouth 2 (two) times daily.   [name] levocetirizine (XYZAL) 5 MG tablet Take 5 mg by mouth every evening.    [name] octreotide (SANDOSTATIN LAR DEPOT) 30 MG injection Inject into the muscle.   . Omega-3 Fatty Acids (FISH OIL) 1000 MG CAPS Take 1 capsule by mouth daily.   . potassium chloride SA (K-DUR,KLOR-CON) 20 MEQ tablet Take 20 mEq by mouth once.    . sodium bicarbonate 650 MG tablet Take 650 mg by mouth daily.   . tamsulosin (FLOMAX) 0.4 MG CAPS capsule Take 0.4 mg by mouth daily.  04/26/2016: Received from: External Pharmacy   No facility-administered encounter medications on file as of 09/26/2018.     ALLERGIES:  No Known Allergies   PHYSICAL EXAM:  ECOG Performance status: 1  Vitals:   09/26/18 1132  BP: 133/70  Pulse: 73  Resp: 18  Temp: 98.6 F (37 C)  SpO2: 100%   Filed Weights   09/26/18 1132  Weight: 210 lb 3.2 oz (95.3 kg)    Physical Exam  Cardiovascular:     Rate and Rhythm: Normal rate and regular rhythm.  Pulmonary:     Effort: Pulmonary effort is normal. No respiratory distress.     Breath sounds: Normal breath sounds.  Abdominal:     General: Bowel sounds are normal.     Palpations: Abdomen is soft. There is no mass.  Musculoskeletal:  General: No swelling.  Skin:    General: Skin is warm.  Neurological:     General: No focal deficit present.     Mental Status: He is alert and oriented to person, place, and time.  Psychiatric:        Mood and Affect: Mood normal.        Behavior: Behavior normal.      LABORATORY DATA:  I have reviewed the labs as listed.  CBC    Component Value Date/Time   WBC 5.8 09/26/2018 1025   RBC 3.19 (L) 09/26/2018 1025   HGB 8.8 (L) 09/26/2018 1025   HCT 28.1 (L) 09/26/2018 1025   PLT 144 (L) 09/26/2018 1025   MCV 88.1 09/26/2018 1025   MCH 27.6 09/26/2018 1025   MCHC 31.3 09/26/2018 1025   RDW 13.5 09/26/2018 1025   LYMPHSABS 0.4 (L) 09/26/2018 1025   MONOABS 0.4 09/26/2018 1025   EOSABS 0.1 09/26/2018 1025   BASOSABS 0.0 09/26/2018 1025   CMP Latest Ref Rng & Units 09/26/2018 08/19/2018 07/05/2018  Glucose 70 - 99 mg/dL 183(H) 89 98  BUN 8 - 23 mg/dL 50(H) 49(H) 41(H)  Creatinine 0.61 - 1.24 mg/dL 4.02(H) 3.76(H) 3.55(H)  Sodium 135 - 145 mmol/L 136 138 139  Potassium 3.5 - 5.1 mmol/L 3.7 4.0 4.4  Chloride 98 - 111 mmol/L 104 106 107  CO2 22 - 32 mmol/L 22 25 24   Calcium 8.9 - 10.3 mg/dL 8.1(L) 8.3(L) 8.8(L)  Total Protein 6.5 - 8.1 g/dL 6.2(L) 6.3(L) 6.2(L)  Total Bilirubin 0.3 - 1.2 mg/dL 0.5 0.8 0.6  Alkaline Phos 38 - 126 U/L 70 58 71  AST 15 - 41 U/L 51(H) 63(H) 51(H)  ALT 0 - 44 U/L 28 27 26        DIAGNOSTIC IMAGING:  I have independently reviewed the scans and discussed with the patient.   I have reviewed Venita Lick LPN's note and agree with the documentation.  I personally performed a face-to-face visit, made revisions and my assessment and  plan is as follows.    ASSESSMENT & PLAN:   Malignant carcinoid tumor ([place]) 1.  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  Initially managed at [place] center.  Transferred care to [facility] in October 2017. - Gallium-68 scan on 10/23/2017 shows metastatic disease in the liver, central right mesenteric mass and multiple peritoneal deposits. -Gallium-68 dotatate PET scan on 04/22/2018 showed multiple foci of intense radiotracer activity consistent with metastatic well-differentiated carcinoid tumor.  Metastatic sites include central mesentery, multiple peritoneal implants, liver metastasis.  Several metastatic lesions have measured higher activity.  Additional mild increase in size of central mesenteric mass.  Lesion within the right ventricle along the interventricular septum thought to be metastatic. - 2D echo on 05/03/2018 showed normal cavity size of the right ventricle with normal systolic function.  -Everolimus 5 mg daily started on 05/17/2018, along with dexamethasone mouthwash. -He denies any symptoms of carcinoid syndrome including flushing, wheezing or diarrhea.  Denies any abdominal pains. -Recently had upper respiratory infection which is being managed by his primary doctor. -He will continue monthly octreotide injections.  His last serum chromogranin level went up. - We will continue everolimus at the same dose for now.  I will reevaluate him in 4 weeks with repeat blood work. -I plan to repeat gallium-68 dotatate PET CT scan in May of this year.    2.  Normocytic anemia: -Combination anemia  from CKD, iron deficiency and bone marrow suppression from everolimus. -He received Feraheme infusion on 08/19/2018 and 08/26/2018. -Hemoglobin today is 8.8.  Denies any bleeding. -I have recommended starting him on Procrit 20,000 units every 14 days and titrated up as needed.  We discussed the side effects in detail.  3.  Head and  neck cancer: Status post surgery and radiation therapy in 2009.  He is in remission.  4.  Kidney cancer: Status post right nephrectomy in May 2010 at [facility].  5.  Prostate cancer: Status post seed implant by Dr. Tammi Klippel in 2016.  He follows up with his urologist.      Orders placed this encounter:  Orders Placed This Encounter  Procedures  . CBC  . CBC with Differential/Platelet  . Comprehensive metabolic panel  . Iron and TIBC  . Ferritin  . Chromogranin A      Derek Jack, MD [place] [phone]

## 2018-09-27 LAB — CHROMOGRANIN A: Chromogranin A (ng/mL): 381.1 ng/mL — ABNORMAL HIGH (ref 0.0–101.8)

## 2018-09-30 LAB — SEROTONIN SERUM: Serotonin, Serum: 203 ng/mL (ref 21–321)

## 2018-10-15 ENCOUNTER — Inpatient Hospital Stay (HOSPITAL_COMMUNITY): Payer: Medicare Other

## 2018-10-15 ENCOUNTER — Other Ambulatory Visit: Payer: Self-pay

## 2018-10-15 VITALS — BP 128/70 | HR 68 | Temp 98.0°F | Resp 18

## 2018-10-15 DIAGNOSIS — N189 Chronic kidney disease, unspecified: Secondary | ICD-10-CM | POA: Diagnosis not present

## 2018-10-15 DIAGNOSIS — C7A00 Malignant carcinoid tumor of unspecified site: Secondary | ICD-10-CM

## 2018-10-15 DIAGNOSIS — C7B02 Secondary carcinoid tumors of liver: Secondary | ICD-10-CM | POA: Diagnosis not present

## 2018-10-15 DIAGNOSIS — D631 Anemia in chronic kidney disease: Secondary | ICD-10-CM | POA: Diagnosis not present

## 2018-10-15 LAB — CBC
HCT: 30.3 % — ABNORMAL LOW (ref 39.0–52.0)
Hemoglobin: 9.2 g/dL — ABNORMAL LOW (ref 13.0–17.0)
MCH: 26.9 pg (ref 26.0–34.0)
MCHC: 30.4 g/dL (ref 30.0–36.0)
MCV: 88.6 fL (ref 80.0–100.0)
Platelets: 123 10*3/uL — ABNORMAL LOW (ref 150–400)
RBC: 3.42 MIL/uL — ABNORMAL LOW (ref 4.22–5.81)
RDW: 14.4 % (ref 11.5–15.5)
WBC: 4.1 10*3/uL (ref 4.0–10.5)
nRBC: 0 % (ref 0.0–0.2)

## 2018-10-15 MED ORDER — EPOETIN ALFA 20000 UNIT/ML IJ SOLN
20000.0000 [IU] | INTRAMUSCULAR | Status: DC
  Administered 2018-10-15: 20000 [IU] via SUBCUTANEOUS

## 2018-10-15 MED ORDER — OCTREOTIDE ACETATE 30 MG IM KIT
30.0000 mg | PACK | Freq: Once | INTRAMUSCULAR | Status: AC
  Administered 2018-10-15: 30 mg via INTRAMUSCULAR

## 2018-10-15 MED ORDER — EPOETIN ALFA 20000 UNIT/ML IJ SOLN
INTRAMUSCULAR | Status: AC
  Filled 2018-10-15: qty 1

## 2018-10-15 NOTE — Progress Notes (Signed)
Abbas A Criger tolerated Sandostatin and Procrit injections without incident or complaint. VSS. Discharged self ambulatory in satisfactory condition.

## 2018-10-15 NOTE — Patient Instructions (Signed)
[place] at [facility]  Discharge Instructions:  Today you received Sandostatin and Procrit _______________________________________________________________  Thank you for choosing [place] at [facility] to provide your oncology and hematology care.  To afford each patient quality time with our providers, please arrive at least 15 minutes before your scheduled appointment.  You need to re-schedule your appointment if you arrive 10 or more minutes late.  We strive to give you quality time with our providers, and arriving late affects you and other patients whose appointments are after yours.  Also, if you no show three or more times for appointments you may be dismissed from the clinic.  Again, thank you for choosing [place] [facility] [place] hope is that these requests will allow you access to exceptional care and in a timely manner. _______________________________________________________________  If you have questions after your visit, please contact our office at [phone] between the hours of 8:30 a.m. and 5:00 p.m. Voicemails left after 4:30 p.m. will not be returned until the following business day. _______________________________________________________________  For prescription refill requests, have your pharmacy contact our office. _______________________________________________________________  Recommendations made by the consultant and any test results will be sent to your referring physician. _______________________________________________________________

## 2018-10-16 DIAGNOSIS — H906 Mixed conductive and sensorineural hearing loss, bilateral: Secondary | ICD-10-CM | POA: Diagnosis not present

## 2018-10-16 DIAGNOSIS — H6593 Unspecified nonsuppurative otitis media, bilateral: Secondary | ICD-10-CM | POA: Diagnosis not present

## 2018-10-16 DIAGNOSIS — Z8521 Personal history of malignant neoplasm of larynx: Secondary | ICD-10-CM | POA: Diagnosis not present

## 2018-10-29 ENCOUNTER — Other Ambulatory Visit (HOSPITAL_COMMUNITY): Payer: Medicare Other

## 2018-10-29 ENCOUNTER — Ambulatory Visit (HOSPITAL_COMMUNITY): Payer: Medicare Other | Admitting: Hematology

## 2018-10-29 ENCOUNTER — Inpatient Hospital Stay (HOSPITAL_COMMUNITY): Payer: Medicare Other | Attending: Hematology

## 2018-10-29 ENCOUNTER — Ambulatory Visit (HOSPITAL_COMMUNITY): Payer: Medicare Other

## 2018-10-29 ENCOUNTER — Other Ambulatory Visit: Payer: Self-pay

## 2018-10-29 DIAGNOSIS — D509 Iron deficiency anemia, unspecified: Secondary | ICD-10-CM

## 2018-10-29 DIAGNOSIS — D631 Anemia in chronic kidney disease: Secondary | ICD-10-CM | POA: Diagnosis not present

## 2018-10-29 DIAGNOSIS — N189 Chronic kidney disease, unspecified: Secondary | ICD-10-CM | POA: Diagnosis not present

## 2018-10-29 DIAGNOSIS — E611 Iron deficiency: Secondary | ICD-10-CM | POA: Diagnosis not present

## 2018-10-29 DIAGNOSIS — C7A00 Malignant carcinoid tumor of unspecified site: Secondary | ICD-10-CM

## 2018-10-29 LAB — COMPREHENSIVE METABOLIC PANEL
ALT: 21 U/L (ref 0–44)
AST: 45 U/L — ABNORMAL HIGH (ref 15–41)
Albumin: 3.3 g/dL — ABNORMAL LOW (ref 3.5–5.0)
Alkaline Phosphatase: 69 U/L (ref 38–126)
Anion gap: 7 (ref 5–15)
BUN: 41 mg/dL — ABNORMAL HIGH (ref 8–23)
CO2: 25 mmol/L (ref 22–32)
Calcium: 8.3 mg/dL — ABNORMAL LOW (ref 8.9–10.3)
Chloride: 103 mmol/L (ref 98–111)
Creatinine, Ser: 3.78 mg/dL — ABNORMAL HIGH (ref 0.61–1.24)
GFR calc Af Amer: 17 mL/min — ABNORMAL LOW (ref >60)
GFR calc non Af Amer: 14 mL/min — ABNORMAL LOW (ref >60)
Glucose, Bld: 90 mg/dL (ref 70–99)
Potassium: 3.5 mmol/L (ref 3.5–5.1)
Sodium: 135 mmol/L (ref 135–145)
Total Bilirubin: 0.7 mg/dL (ref 0.3–1.2)
Total Protein: 6.2 g/dL — ABNORMAL LOW (ref 6.5–8.1)

## 2018-10-29 LAB — IRON AND TIBC
Iron: 35 ug/dL — ABNORMAL LOW (ref 45–182)
Saturation Ratios: 17 % — ABNORMAL LOW (ref 17.9–39.5)
TIBC: 209 ug/dL — ABNORMAL LOW (ref 250–450)
UIBC: 174 ug/dL

## 2018-10-29 LAB — CBC WITH DIFFERENTIAL/PLATELET
Abs Immature Granulocytes: 0.01 10*3/uL (ref 0.00–0.07)
Basophils Absolute: 0 10*3/uL (ref 0.0–0.1)
Basophils Relative: 1 %
Eosinophils Absolute: 0.1 10*3/uL (ref 0.0–0.5)
Eosinophils Relative: 3 %
HCT: 30.9 % — ABNORMAL LOW (ref 39.0–52.0)
Hemoglobin: 9.5 g/dL — ABNORMAL LOW (ref 13.0–17.0)
Immature Granulocytes: 0 %
Lymphocytes Relative: 9 %
Lymphs Abs: 0.3 10*3/uL — ABNORMAL LOW (ref 0.7–4.0)
MCH: 26.7 pg (ref 26.0–34.0)
MCHC: 30.7 g/dL (ref 30.0–36.0)
MCV: 86.8 fL (ref 80.0–100.0)
Monocytes Absolute: 0.6 10*3/uL (ref 0.1–1.0)
Monocytes Relative: 15 %
Neutro Abs: 2.6 10*3/uL (ref 1.7–7.7)
Neutrophils Relative %: 72 %
Platelets: 121 10*3/uL — ABNORMAL LOW (ref 150–400)
RBC: 3.56 MIL/uL — ABNORMAL LOW (ref 4.22–5.81)
RDW: 14.4 % (ref 11.5–15.5)
WBC: 3.6 10*3/uL — ABNORMAL LOW (ref 4.0–10.5)
nRBC: 0 % (ref 0.0–0.2)

## 2018-10-29 LAB — FERRITIN: Ferritin: 362 ng/mL — ABNORMAL HIGH (ref 24–336)

## 2018-10-30 LAB — CHROMOGRANIN A: Chromogranin A (ng/mL): 509.3 ng/mL — ABNORMAL HIGH (ref 0.0–101.8)

## 2018-10-31 ENCOUNTER — Encounter (HOSPITAL_COMMUNITY): Payer: Self-pay

## 2018-10-31 ENCOUNTER — Inpatient Hospital Stay (HOSPITAL_BASED_OUTPATIENT_CLINIC_OR_DEPARTMENT_OTHER): Payer: Medicare Other | Admitting: Nurse Practitioner

## 2018-10-31 ENCOUNTER — Other Ambulatory Visit (HOSPITAL_COMMUNITY): Payer: Medicare Other

## 2018-10-31 ENCOUNTER — Other Ambulatory Visit: Payer: Self-pay

## 2018-10-31 ENCOUNTER — Inpatient Hospital Stay (HOSPITAL_COMMUNITY): Payer: Medicare Other

## 2018-10-31 DIAGNOSIS — M7989 Other specified soft tissue disorders: Secondary | ICD-10-CM

## 2018-10-31 DIAGNOSIS — Z8546 Personal history of malignant neoplasm of prostate: Secondary | ICD-10-CM | POA: Diagnosis not present

## 2018-10-31 DIAGNOSIS — N189 Chronic kidney disease, unspecified: Secondary | ICD-10-CM | POA: Diagnosis not present

## 2018-10-31 DIAGNOSIS — M25472 Effusion, left ankle: Secondary | ICD-10-CM

## 2018-10-31 DIAGNOSIS — C7A00 Malignant carcinoid tumor of unspecified site: Secondary | ICD-10-CM

## 2018-10-31 DIAGNOSIS — D509 Iron deficiency anemia, unspecified: Secondary | ICD-10-CM | POA: Diagnosis not present

## 2018-10-31 DIAGNOSIS — Z85528 Personal history of other malignant neoplasm of kidney: Secondary | ICD-10-CM

## 2018-10-31 DIAGNOSIS — Z8589 Personal history of malignant neoplasm of other organs and systems: Secondary | ICD-10-CM

## 2018-10-31 DIAGNOSIS — M25471 Effusion, right ankle: Secondary | ICD-10-CM

## 2018-10-31 DIAGNOSIS — C7B02 Secondary carcinoid tumors of liver: Secondary | ICD-10-CM

## 2018-10-31 DIAGNOSIS — I5031 Acute diastolic (congestive) heart failure: Secondary | ICD-10-CM | POA: Diagnosis not present

## 2018-10-31 DIAGNOSIS — D631 Anemia in chronic kidney disease: Secondary | ICD-10-CM

## 2018-10-31 MED ORDER — EPOETIN ALFA 20000 UNIT/ML IJ SOLN
20000.0000 [IU] | INTRAMUSCULAR | Status: DC
  Administered 2018-10-31: 11:00:00 20000 [IU] via SUBCUTANEOUS
  Filled 2018-10-31: qty 1

## 2018-10-31 NOTE — Assessment & Plan Note (Addendum)
1.  Metastatic carcinoid tumor to the liver and peritoneum: - [facility].  [facility] in October 2017. - He has been on Sandostatin for over 5 years. - Gallium-68 PET CT scan on 04/22/2018 showed multiple foci of intense radiotracer activity consistent with metastatic well differentiated carcinoid tumor.  Metastatic sites include central mesentery, multiple peritoneal implants, liver metastasis.  Several metastatic lesions have measured higher activity.  Additional mild increase in size of central mesenteric mass. -2D echo on 05/03/2018 showed normal cavity size of the right ventricle with normal systolic function.  EF was 60 to 65%. - Everolimus 5 mg daily started on 05/17/2018 along with dexamethasone mouthwash. -She denies any symptoms of carcinoid syndrome including flushing, wheezing or diarrhea.  He denies any abdominal pains. -He does report bilateral ankle and feet swelling over the past few weeks.  His Lasix has helped decrease this.  He has not had any more mouth sores since using the dexamethasone mouthwash. - He will continue monthly octreotide injections. -She will continue everolimus at the same dose for now. -Labs on 10/29/2018 showed his serum chromogranin levels have increased to 509.3 from 381.  His potassium was 3.5 and calcium 8.2 - We will repeat gallium-68 PET CT scan and labs in a month. -He will follow-up in the office after his scan and labs.   2.  Normocytic anemia: -This is from a combination of anemia from CKD, iron deficiency, and bone marrow suppression from the everolimus. -He receives intermittent Feraheme infusions.  The last ones were on 08/19/2018 and 08/26/2018. - He has started on Procrit 20,000 units every 14 days which can be titrated up as needed.  He has received 2 doses so far. -Labs on 10/29/2018 showed hemoglobin of 9.5 which is an increase from last visit.  His ferritin is 362 and  percent saturation was 17. -He denies any bleeding. -We will recheck an iron panel on him next visit to see if he needs any iron.  3.  Head and neck cancer: -Status post surgery and radiation therapy in 2009. -She is in remission at this time.  4.  Kidney cancer: -Status post right nephrectomy in May 2010 at [place].  5.  Prostate cancer: -Status post seed implantation by Dr. Tammi Klippel in 2016. -He follows up with his urologist.

## 2018-10-31 NOTE — Patient Instructions (Signed)
[facility] at [facility] Discharge Instructions     Thank you for choosing [facility] to provide your oncology and hematology care.  To afford each patient quality time with our provider, please arrive at least 15 minutes before your scheduled appointment time.   If you have a lab appointment with the Cancer Center please come in thru the  Main Entrance and check in at the main information desk  You need to re-schedule your appointment should you arrive 10 or more minutes late.  We strive to give you quality time with our providers, and arriving late affects you and other patients whose appointments are after yours.  Also, if you no show three or more times for appointments you may be dismissed from the clinic at the providers discretion.     Again, thank you for choosing [facility].  Our hope is that these requests will decrease the amount of time that you wait before being seen by our physicians.       _____________________________________________________________  Should you have questions after your visit to [facility], please contact our office at [phone] between the hours of 8:00 a.m. and 4:30 p.m.  Voicemails left after 4:00 p.m. will not be returned until the following business day.  For prescription refill requests, have your pharmacy contact our office and allow 72 hours.    Cancer Center Support Programs:   > Cancer Support Group  2nd Tuesday of the month 1pm-2pm, Journey Room    

## 2018-10-31 NOTE — Progress Notes (Signed)
Allen Davenport tolerated Procrit injection well without complaints or incident. Hgb 9.5 today. VSS Pt discharged self ambulatory in satisfactory condition

## 2018-10-31 NOTE — Progress Notes (Signed)
Allen Davenport, [place] [address]   CLINIC:  Medical Oncology/Hematology  PCP:  [name], MD [place] [phone]   REASON FOR VISIT: Follow-up for metastatic carcinoid tumor  CURRENT THERAPY: Everolimus 5 mg   INTERVAL HISTORY:  Allen Davenport 78 y.o. male returns for routine follow-up for metastatic carcinoid tumor.  Patient is here today alone.  He has been doing well since his last visit.  He denies any flushing wheezing or diarrhea.  He denies any headaches.  He does report bilateral ankle and feet swelling over the past few weeks.  He reports his Lasix does help a little.  He has not had any more mouth sores since the dexamethasone mouthwash.  He does not use this every day just when he feels like he is getting a sore. Denies any nausea, vomiting, or diarrhea. Denies any new pains. Had not noticed any recent bleeding such as epistaxis, hematuria or hematochezia. Denies recent chest pain on exertion, shortness of breath on minimal exertion, pre-syncopal episodes, or palpitations. Denies any numbness or tingling in hands or feet. Denies any recent fevers, infections, or recent hospitalizations. Patient reports appetite at 75% and energy level at 75%.  He is eating well and maintaining his weight at this time.   REVIEW OF SYSTEMS:  Review of Systems  Cardiovascular: Positive for leg swelling.  All other systems reviewed and are negative.    PAST MEDICAL/SURGICAL HISTORY:  Past Medical History:  Diagnosis Date  . Anemia   . Cancer [facility])    right renal . Prostate- Radiation treatment  . CHF (congestive heart failure) ([place])   . Chronic kidney disease    stage 4  . Edema   . Heart murmur    "nothing to worry about"  . Hyperlipidemia   . Hypertension   . Malignant carcinoid tumor ([place]) 01/03/2016   Past Surgical History:  Procedure Laterality Date  . AV FISTULA PLACEMENT Left 10/20/2014   Procedure: Left Arm  ARTERIOVENOUS (AV) FISTULA CREATION;  Surgeon: Elam Dutch, MD;  Location: [place];  Service: Vascular;  Laterality: Left;  . NEPHRECTOMY Right 2010     SOCIAL HISTORY:  Social History   Socioeconomic History  . Marital status: Married    Spouse name: Not on file  . Number of children: Not on file  . Years of education: Not on file  . Highest education level: Not on file  Occupational History  . Not on file  Social Needs  . Financial resource strain: Not on file  . Food insecurity:    Worry: Not on file    Inability: Not on file  . Transportation needs:    Medical: Not on file    Non-medical: Not on file  Tobacco Use  . Smoking status: Never Smoker  . Smokeless tobacco: Never Used  Substance and Sexual Activity  . Alcohol use: No    Alcohol/week: 0.0 standard drinks  . Drug use: No  . Sexual activity: Not on file  Lifestyle  . Physical activity:    Days per week: Not on file    Minutes per session: Not on file  . Stress: Not on file  Relationships  . Social connections:    Talks on phone: Not on file    Gets together: Not on file    Attends religious service: Not on file    Active member of club or organization: Not on file  Attends meetings of clubs or organizations: Not on file    Relationship status: Not on file  . Intimate partner violence:    Fear of current or ex partner: Not on file    Emotionally abused: Not on file    Physically abused: Not on file    Forced sexual activity: Not on file  Other Topics Concern  . Not on file  Social History Narrative  . Not on file    FAMILY HISTORY:  Family History  Problem Relation Age of Onset  . Cancer Mother   . Hypertension Father   . Cancer Sister     CURRENT MEDICATIONS:  Outpatient Encounter Medications as of 10/31/2018  Medication Sig Note  . alendronate (FOSAMAX) 70 MG tablet TAKE ONE TABLET BY MOUTH ONCE A WEEK IN THE MORNING WITH A FULL GLASS OF WATER ON AN EMPTY STOMACH. DO NOT LAY DOWN FOR  30 MINUTES. 07/19/2016: Received from: External Pharmacy  . amLODipine (NORVASC) 10 MG tablet Take 10 mg by mouth daily.    [name] atorvastatin (LIPITOR) 80 MG tablet Take 80 mg by mouth daily at 6 PM.    . azelastine (ASTELIN) 0.1 % nasal spray Place 2 sprays into both nostrils 2 (two) times daily.   . bisoprolol-hydrochlorothiazide (ZIAC) 10-6.25 MG tablet Take 1 tablet by mouth daily.    . calcitRIOL (ROCALTROL) 0.25 MCG capsule Take 0.25 mcg by mouth daily.  04/26/2016: Received from: External Pharmacy  . calcium acetate (PHOSLO) 667 MG capsule Take 667 mg by mouth 3 (three) times daily with meals.    . calcium carbonate (OS-CAL - DOSED IN MG OF ELEMENTAL CALCIUM) 1250 (500 Ca) MG tablet Take 1 tablet by mouth daily with breakfast.    . carvedilol (COREG) 3.125 MG tablet Take 3.125 mg by mouth 2 (two) times daily with a meal.    . cholecalciferol (VITAMIN D) 1000 UNITS tablet Take 1,000 Units by mouth daily.   [name] everolimus (AFINITOR) 5 MG tablet Take 1 tablet (5 mg total) by mouth daily.   [name] FERREX 150 150 MG capsule Take 150 mg by mouth 2 (two) times daily.   . furosemide (LASIX) 40 MG tablet Take 40 mg by mouth.   . hydrALAZINE (APRESOLINE) 25 MG tablet TAKE THREE (3) TABLETS BY MOUTH TWICE DAILY 07/19/2016: Received from: External Pharmacy  . labetalol (NORMODYNE) 300 MG tablet Take 300 mg by mouth 2 (two) times daily.   [name] levocetirizine (XYZAL) 5 MG tablet Take 5 mg by mouth every evening.    [name] octreotide (SANDOSTATIN LAR DEPOT) 30 MG injection Inject into the muscle.   . Omega-3 Fatty Acids (FISH OIL) 1000 MG CAPS Take 1 capsule by mouth daily.   . potassium chloride SA (K-DUR,KLOR-CON) 20 MEQ tablet Take 20 mEq by mouth once.    . sodium bicarbonate 650 MG tablet Take 650 mg by mouth daily.   . tamsulosin (FLOMAX) 0.4 MG CAPS capsule Take 0.4 mg by mouth daily.  04/26/2016: Received from: External Pharmacy  . dexamethasone (DECADRON) 0.5 MG/5ML solution Swish 41mL by mouth four times daily for  2 minutes then spit. (Patient not taking: Reported on 10/31/2018) 09/26/2018: Patient is using this once a day.   No facility-administered encounter medications on file as of 10/31/2018.     ALLERGIES:  No Known Allergies   PHYSICAL EXAM:  ECOG Performance status: 1   Vitals:   10/31/18 0944  BP: 129/64  Pulse: 63  Resp: 16  Temp: 97.9 F (36.6 C)  SpO2: 100%   Filed Weights   10/31/18 0944  Weight: 206 lb 8 oz (93.7 kg)    Physical Exam Constitutional:      Appearance: Normal appearance. He is normal weight.  Cardiovascular:     Rate and Rhythm: Normal rate and regular rhythm.     Heart sounds: Normal heart sounds.  Pulmonary:     Effort: Pulmonary effort is normal.     Breath sounds: Normal breath sounds.  Abdominal:     General: Bowel sounds are normal.     Palpations: Abdomen is soft.  Musculoskeletal: Normal range of motion.  Skin:    General: Skin is warm and dry.  Neurological:     Mental Status: He is alert and oriented to person, place, and time. Mental status is at baseline.  Psychiatric:        Mood and Affect: Mood normal.        Behavior: Behavior normal.        Thought Content: Thought content normal.        Judgment: Judgment normal.      LABORATORY DATA:  I have reviewed the labs as listed.  CBC    Component Value Date/Time   WBC 3.6 (L) 10/29/2018 1321   RBC 3.56 (L) 10/29/2018 1321   HGB 9.5 (L) 10/29/2018 1321   HCT 30.9 (L) 10/29/2018 1321   PLT 121 (L) 10/29/2018 1321   MCV 86.8 10/29/2018 1321   MCH 26.7 10/29/2018 1321   MCHC 30.7 10/29/2018 1321   RDW 14.4 10/29/2018 1321   LYMPHSABS 0.3 (L) 10/29/2018 1321   MONOABS 0.6 10/29/2018 1321   EOSABS 0.1 10/29/2018 1321   BASOSABS 0.0 10/29/2018 1321   CMP Latest Ref Rng & Units 10/29/2018 09/26/2018 08/19/2018  Glucose 70 - 99 mg/dL 90 183(H) 89  BUN 8 - 23 mg/dL 41(H) 50(H) 49(H)  Creatinine 0.61 - 1.24 mg/dL 3.78(H) 4.02(H) 3.76(H)  Sodium 135 - 145 mmol/L 135 136 138   Potassium 3.5 - 5.1 mmol/L 3.5 3.7 4.0  Chloride 98 - 111 mmol/L 103 104 106  CO2 22 - 32 mmol/L 25 22 25   Calcium 8.9 - 10.3 mg/dL 8.3(L) 8.1(L) 8.3(L)  Total Protein 6.5 - 8.1 g/dL 6.2(L) 6.2(L) 6.3(L)  Total Bilirubin 0.3 - 1.2 mg/dL 0.7 0.5 0.8  Alkaline Phos 38 - 126 U/L 69 70 58  AST 15 - 41 U/L 45(H) 51(H) 63(H)  ALT 0 - 44 U/L 21 28 27     I personally performed a face-to-face visit. All questions were answered to patient's stated satisfaction. Encouraged patient to call with any new concerns or questions before his next visit to the cancer center and we can certain see him sooner, if needed.     ASSESSMENT & PLAN:   Malignant carcinoid tumor ([place]) 1.  Metastatic carcinoid tumor to the liver and peritoneum: - [facility]  [facility] care to [facility] in October 2017. - He has been on Sandostatin for over 5 years. - Gallium-68 PET CT scan on 04/22/2018 showed multiple foci of intense radiotracer activity consistent with metastatic well differentiated carcinoid tumor.  Metastatic sites include central mesentery, multiple peritoneal implants, liver metastasis.  Several metastatic lesions have measured higher activity.  Additional mild increase in size of central mesenteric mass. -2D echo on 05/03/2018 showed normal cavity size of the right ventricle with normal systolic function.  EF was 60 to 65%. - Everolimus 5 mg daily started  on 05/17/2018 along with dexamethasone mouthwash. -She denies any symptoms of carcinoid syndrome including flushing, wheezing or diarrhea.  He denies any abdominal pains. -He does report bilateral ankle and feet swelling over the past few weeks.  His Lasix has helped decrease this.  He has not had any more mouth sores since using the dexamethasone mouthwash. - He will continue monthly octreotide injections. -She will continue everolimus at the same dose for now. -Labs on 10/29/2018 showed his serum  chromogranin levels have increased to 509.3 from 381.  His potassium was 3.5 and calcium 8.2 - We will repeat gallium-68 PET CT scan and labs in a month. -He will follow-up in the office after his scan and labs.   2.  Normocytic anemia: -This is from a combination of anemia from CKD, iron deficiency, and bone marrow suppression from the everolimus. -He receives intermittent Feraheme infusions.  The last ones were on 08/19/2018 and 08/26/2018. - He has started on Procrit 20,000 units every 14 days which can be titrated up as needed.  He has received 2 doses so far. -Labs on 10/29/2018 showed hemoglobin of 9.5 which is an increase from last visit.  His ferritin is 362 and percent saturation was 17. -He denies any bleeding. -We will recheck an iron panel on him next visit to see if he needs any iron.  3.  Head and neck cancer: -Status post surgery and radiation therapy in 2009. -She is in remission at this time.  4.  Kidney cancer: -Status post right nephrectomy in May 2010 at [place].  5.  Prostate cancer: -Status post seed implantation by Dr. Tammi Klippel in 2016. -He follows up with his urologist.      Orders placed this encounter:  Orders Placed This Encounter  Procedures  . NM PET (NETSPOT GA 67 DOTATATE) SKULL BASE TO MID THIGH  . CBC with Differential/Platelet  . Comprehensive metabolic panel  . Ferritin  . Iron and TIBC  . Magnesium  . CBC with Differential/Platelet  . Comprehensive metabolic panel  . Lactate dehydrogenase  . Chromogranin A  . Serotonin serum      Francene Finders, FNP-C [facility] [phone]

## 2018-10-31 NOTE — Patient Instructions (Signed)
[facility] at [facility] Discharge Instructions  Received Procrit injection today. Follow-up as scheduled. Call clinic for any questions or concerns   Thank you for choosing [facility] at [facility] to provide your oncology and hematology care.  To afford each patient quality time with our provider, please arrive at least 15 minutes before your scheduled appointment time.   If you have a lab appointment with the Cancer Center please come in thru the  Main Entrance and check in at the main information desk  You need to re-schedule your appointment should you arrive 10 or more minutes late.  We strive to give you quality time with our providers, and arriving late affects you and other patients whose appointments are after yours.  Also, if you no show three or more times for appointments you may be dismissed from the clinic at the providers discretion.     Again, thank you for choosing [facility].  Our hope is that these requests will decrease the amount of time that you wait before being seen by our physicians.       _____________________________________________________________  Should you have questions after your visit to [facility], please contact our office at [phone] between the hours of 8:00 a.m. and 4:30 p.m.  Voicemails left after 4:00 p.m. will not be returned until the following business day.  For prescription refill requests, have your pharmacy contact our office and allow 72 hours.    Cancer Center Support Programs:   > Cancer Support Group  2nd Tuesday of the month 1pm-2pm, Journey Room   

## 2018-11-01 ENCOUNTER — Telehealth (HOSPITAL_COMMUNITY): Payer: Self-pay | Admitting: *Deleted

## 2018-11-04 ENCOUNTER — Other Ambulatory Visit (HOSPITAL_COMMUNITY): Payer: Self-pay | Admitting: *Deleted

## 2018-11-04 ENCOUNTER — Other Ambulatory Visit (HOSPITAL_COMMUNITY): Payer: Medicare Other

## 2018-11-04 ENCOUNTER — Ambulatory Visit (HOSPITAL_COMMUNITY): Payer: Medicare Other

## 2018-11-04 ENCOUNTER — Ambulatory Visit (HOSPITAL_COMMUNITY): Payer: Medicare Other | Admitting: Hematology

## 2018-11-04 DIAGNOSIS — C7A019 Malignant carcinoid tumor of the small intestine, unspecified portion: Secondary | ICD-10-CM

## 2018-11-04 MED ORDER — EVEROLIMUS 5 MG PO TABS: 5.0000 mg | ORAL_TABLET | Freq: Every day | ORAL | 2 refills | Status: DC

## 2018-11-04 NOTE — Telephone Encounter (Signed)
Refill on Afinitor sent to [facility] per patient's request.  Per Francene Finders NP's last office note, patient to continue current dose of Afinitor.

## 2018-11-06 ENCOUNTER — Other Ambulatory Visit (HOSPITAL_COMMUNITY): Payer: Self-pay | Admitting: *Deleted

## 2018-11-06 DIAGNOSIS — C7A019 Malignant carcinoid tumor of the small intestine, unspecified portion: Secondary | ICD-10-CM

## 2018-11-06 MED ORDER — EVEROLIMUS 5 MG PO TABS: 5.0000 mg | ORAL_TABLET | Freq: Every day | ORAL | 2 refills | Status: DC

## 2018-11-13 ENCOUNTER — Other Ambulatory Visit: Payer: Self-pay

## 2018-11-14 ENCOUNTER — Encounter (HOSPITAL_COMMUNITY): Payer: Self-pay

## 2018-11-14 ENCOUNTER — Inpatient Hospital Stay (HOSPITAL_COMMUNITY): Payer: Medicare Other

## 2018-11-14 ENCOUNTER — Other Ambulatory Visit: Payer: Self-pay

## 2018-11-14 VITALS — BP 113/67 | HR 70 | Temp 98.2°F | Resp 18

## 2018-11-14 DIAGNOSIS — C7A00 Malignant carcinoid tumor of unspecified site: Secondary | ICD-10-CM

## 2018-11-14 DIAGNOSIS — N189 Chronic kidney disease, unspecified: Secondary | ICD-10-CM | POA: Diagnosis not present

## 2018-11-14 DIAGNOSIS — D631 Anemia in chronic kidney disease: Secondary | ICD-10-CM | POA: Diagnosis not present

## 2018-11-14 DIAGNOSIS — D509 Iron deficiency anemia, unspecified: Secondary | ICD-10-CM | POA: Diagnosis not present

## 2018-11-14 LAB — CBC
HCT: 30.2 % — ABNORMAL LOW (ref 39.0–52.0)
Hemoglobin: 9.7 g/dL — ABNORMAL LOW (ref 13.0–17.0)
MCH: 27.2 pg (ref 26.0–34.0)
MCHC: 32.1 g/dL (ref 30.0–36.0)
MCV: 84.8 fL (ref 80.0–100.0)
Platelets: 155 10*3/uL (ref 150–400)
RBC: 3.56 MIL/uL — ABNORMAL LOW (ref 4.22–5.81)
RDW: 14.4 % (ref 11.5–15.5)
WBC: 5 10*3/uL (ref 4.0–10.5)
nRBC: 0 % (ref 0.0–0.2)

## 2018-11-14 MED ORDER — EPOETIN ALFA 20000 UNIT/ML IJ SOLN
20000.0000 [IU] | INTRAMUSCULAR | Status: DC
  Administered 2018-11-14: 20000 [IU] via SUBCUTANEOUS
  Filled 2018-11-14: qty 1

## 2018-11-14 MED ORDER — OCTREOTIDE ACETATE 30 MG IM KIT
30.0000 mg | PACK | Freq: Once | INTRAMUSCULAR | Status: AC
  Administered 2018-11-14: 30 mg via INTRAMUSCULAR

## 2018-11-14 MED ORDER — OCTREOTIDE ACETATE 30 MG IM KIT
PACK | INTRAMUSCULAR | Status: AC
  Filled 2018-11-14: qty 1

## 2018-11-14 NOTE — Patient Instructions (Signed)
You received your sandostatin injection today that is for your carcinoid tumor and you also received your procrit injection which is what helps boost your RBC production to raise your hemoglobin.  Follow up in 2 weeks as scheduled.

## 2018-11-14 NOTE — Progress Notes (Signed)
Patient is here today for Sandostatin injection and Procrit injection.  They are both due to be given today.  Patient's hemoglobin is 9.7.  Procrit will be given per physician orders.

## 2018-11-20 DIAGNOSIS — I5032 Chronic diastolic (congestive) heart failure: Secondary | ICD-10-CM | POA: Diagnosis not present

## 2018-11-20 DIAGNOSIS — N184 Chronic kidney disease, stage 4 (severe): Secondary | ICD-10-CM | POA: Diagnosis not present

## 2018-11-20 DIAGNOSIS — I10 Essential (primary) hypertension: Secondary | ICD-10-CM | POA: Diagnosis not present

## 2018-11-27 ENCOUNTER — Other Ambulatory Visit: Payer: Self-pay

## 2018-11-27 ENCOUNTER — Ambulatory Visit (HOSPITAL_COMMUNITY)
Admission: RE | Admit: 2018-11-27 | Discharge: 2018-11-27 | Disposition: A | Discharge status: Home / Self Care | Payer: Medicare Other | Source: Ambulatory Visit | Attending: Nurse Practitioner | Admitting: Nurse Practitioner

## 2018-11-27 DIAGNOSIS — C7A00 Malignant carcinoid tumor of unspecified site: Secondary | ICD-10-CM | POA: Diagnosis not present

## 2018-11-27 DIAGNOSIS — K769 Liver disease, unspecified: Secondary | ICD-10-CM | POA: Diagnosis not present

## 2018-11-27 MED ORDER — GALLIUM GA 68 DOTATATE IV KIT: 4.5000 | PACK | Freq: Once | INTRAVENOUS | Status: DC | PRN

## 2018-11-29 ENCOUNTER — Other Ambulatory Visit: Payer: Self-pay

## 2018-11-29 ENCOUNTER — Inpatient Hospital Stay (HOSPITAL_BASED_OUTPATIENT_CLINIC_OR_DEPARTMENT_OTHER): Payer: Medicare Other | Admitting: Hematology

## 2018-11-29 ENCOUNTER — Inpatient Hospital Stay (HOSPITAL_COMMUNITY): Payer: Medicare Other

## 2018-11-29 ENCOUNTER — Encounter (HOSPITAL_COMMUNITY): Payer: Self-pay | Admitting: Hematology

## 2018-11-29 ENCOUNTER — Inpatient Hospital Stay (HOSPITAL_COMMUNITY): Payer: Medicare Other | Attending: Hematology

## 2018-11-29 VITALS — BP 118/60 | HR 62 | Temp 97.9°F | Resp 18 | Wt 198.0 lb

## 2018-11-29 DIAGNOSIS — C7B02 Secondary carcinoid tumors of liver: Secondary | ICD-10-CM | POA: Diagnosis not present

## 2018-11-29 DIAGNOSIS — D631 Anemia in chronic kidney disease: Secondary | ICD-10-CM | POA: Diagnosis not present

## 2018-11-29 DIAGNOSIS — N189 Chronic kidney disease, unspecified: Secondary | ICD-10-CM | POA: Insufficient documentation

## 2018-11-29 DIAGNOSIS — E611 Iron deficiency: Secondary | ICD-10-CM | POA: Insufficient documentation

## 2018-11-29 DIAGNOSIS — C7A00 Malignant carcinoid tumor of unspecified site: Secondary | ICD-10-CM

## 2018-11-29 DIAGNOSIS — D509 Iron deficiency anemia, unspecified: Secondary | ICD-10-CM

## 2018-11-29 DIAGNOSIS — M7989 Other specified soft tissue disorders: Secondary | ICD-10-CM | POA: Diagnosis not present

## 2018-11-29 LAB — COMPREHENSIVE METABOLIC PANEL
ALT: 26 U/L (ref 0–44)
AST: 67 U/L — ABNORMAL HIGH (ref 15–41)
Albumin: 3.2 g/dL — ABNORMAL LOW (ref 3.5–5.0)
Alkaline Phosphatase: 63 U/L (ref 38–126)
Anion gap: 13 (ref 5–15)
BUN: 68 mg/dL — ABNORMAL HIGH (ref 8–23)
CO2: 29 mmol/L (ref 22–32)
Calcium: 8.3 mg/dL — ABNORMAL LOW (ref 8.9–10.3)
Chloride: 95 mmol/L — ABNORMAL LOW (ref 98–111)
Creatinine, Ser: 4.67 mg/dL — ABNORMAL HIGH (ref 0.61–1.24)
GFR calc Af Amer: 13 mL/min — ABNORMAL LOW (ref >60)
GFR calc non Af Amer: 11 mL/min — ABNORMAL LOW (ref >60)
Glucose, Bld: 117 mg/dL — ABNORMAL HIGH (ref 70–99)
Potassium: 3 mmol/L — ABNORMAL LOW (ref 3.5–5.1)
Sodium: 137 mmol/L (ref 135–145)
Total Bilirubin: 0.9 mg/dL (ref 0.3–1.2)
Total Protein: 6.1 g/dL — ABNORMAL LOW (ref 6.5–8.1)

## 2018-11-29 LAB — CBC WITH DIFFERENTIAL/PLATELET
Abs Immature Granulocytes: 0.01 10*3/uL (ref 0.00–0.07)
Basophils Absolute: 0 10*3/uL (ref 0.0–0.1)
Basophils Relative: 1 %
Eosinophils Absolute: 0.1 10*3/uL (ref 0.0–0.5)
Eosinophils Relative: 4 %
HCT: 31.3 % — ABNORMAL LOW (ref 39.0–52.0)
Hemoglobin: 9.8 g/dL — ABNORMAL LOW (ref 13.0–17.0)
Immature Granulocytes: 0 %
Lymphocytes Relative: 8 %
Lymphs Abs: 0.3 10*3/uL — ABNORMAL LOW (ref 0.7–4.0)
MCH: 26.4 pg (ref 26.0–34.0)
MCHC: 31.3 g/dL (ref 30.0–36.0)
MCV: 84.4 fL (ref 80.0–100.0)
Monocytes Absolute: 0.4 10*3/uL (ref 0.1–1.0)
Monocytes Relative: 13 %
Neutro Abs: 2.4 10*3/uL (ref 1.7–7.7)
Neutrophils Relative %: 74 %
Platelets: 105 10*3/uL — ABNORMAL LOW (ref 150–400)
RBC: 3.71 MIL/uL — ABNORMAL LOW (ref 4.22–5.81)
RDW: 13.9 % (ref 11.5–15.5)
WBC: 3.3 10*3/uL — ABNORMAL LOW (ref 4.0–10.5)
nRBC: 0 % (ref 0.0–0.2)

## 2018-11-29 LAB — MAGNESIUM: Magnesium: 2.2 mg/dL (ref 1.7–2.4)

## 2018-11-29 LAB — IRON AND TIBC
Iron: 30 ug/dL — ABNORMAL LOW (ref 45–182)
Saturation Ratios: 15 % — ABNORMAL LOW (ref 17.9–39.5)
TIBC: 204 ug/dL — ABNORMAL LOW (ref 250–450)
UIBC: 174 ug/dL

## 2018-11-29 LAB — FERRITIN: Ferritin: 470 ng/mL — ABNORMAL HIGH (ref 24–336)

## 2018-11-29 LAB — LACTATE DEHYDROGENASE: LDH: 342 U/L — ABNORMAL HIGH (ref 98–192)

## 2018-11-29 MED ORDER — EPOETIN ALFA 20000 UNIT/ML IJ SOLN
20000.0000 [IU] | Freq: Once | INTRAMUSCULAR | Status: AC
  Administered 2018-11-29: 09:00:00 20000 [IU] via SUBCUTANEOUS
  Filled 2018-11-29: qty 1

## 2018-11-29 NOTE — Assessment & Plan Note (Signed)
1.  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  Initially managed at [facility].  Transferred care to [facility] in October 2017. - Gallium-68 scan on 10/23/2017 shows metastatic disease in the liver, central right mesenteric mass and multiple peritoneal deposits. -Gallium-68 dotatate PET scan on 04/22/2018 showed multiple foci of intense radiotracer activity consistent with metastatic well-differentiated carcinoid tumor.  Metastatic sites include central mesentery, multiple peritoneal implants, liver metastasis.  Several metastatic lesions have measured higher activity.  Additional mild increase in size of central mesenteric mass.  Lesion within the right ventricle along the interventricular septum thought to be metastatic. - 2D echo on 05/03/2018 showed normal cavity size of the right ventricle with normal systolic function.  -Everolimus 5 mg daily started on 05/17/2018 along with dexamethasone mouthwash. -He denies any symptoms of carcinoid syndrome including flushing, wheezing or diarrhea. -Denies any abdominal pains.  He is tolerating everolimus very well. - Gallium-68 dotatate scan on 11/27/2018 shows overall stable well-differentiated neuroendocrine tumor metastasis at multiple sites in the liver, peritoneum and potentially heart.  Stable activity of liver lesions.  Decrease in size and activity of the right central mesenteric mass.  Decrease in activity of peritoneal implants. - We will continue the same dose of everolimus at this time.  He will continue monthly Sandostatin injections.  2.  Normocytic anemia: -Combination anemia from CKD, iron deficiency and bone marrow suppression from everolimus. -Last Feraheme on 08/26/2018. -Ferritin was 362, percent saturation was 17.  I have recommended 1 more dose of Feraheme. -He was started on Procrit 20,000 units every 2 weeks on 09/26/2018.

## 2018-11-29 NOTE — Patient Instructions (Addendum)
[place] at [facility] Discharge Instructions  You were seen today by Dr. Delton Coombes. He went over your recent lab and scan results. Continue monthly injection as well as every 2 week Procrit injection. He will set you up for IV iron.  He will see you back in 4 weeks for labs and follow up.  Stop taking the fluid pill every other day.  Thank you for choosing [place] at [facility] to provide your oncology and hematology care.  To afford each patient quality time with our provider, please arrive at least 15 minutes before your scheduled appointment time.   If you have a lab appointment with the [facility] please come in thru the  Main Entrance and check in at the main information desk  You need to re-schedule your appointment should you arrive 10 or more minutes late.  We strive to give you quality time with our providers, and arriving late affects you and other patients whose appointments are after yours.  Also, if you no show three or more times for appointments you may be dismissed from the clinic at the providers discretion.     Again, thank you for choosing [facility].  Our hope is that these requests will decrease the amount of time that you wait before being seen by our physicians.       _____________________________________________________________  Should you have questions after your visit to [facility], please contact our office at [phone] between the hours of 8:00 a.m. and 4:30 p.m.  Voicemails left after 4:00 p.m. will not be returned until the following business day.  For prescription refill requests, have your pharmacy contact our office and allow 72 hours.    Cancer Center Support Programs:   > Cancer Support Group  2nd Tuesday of the month 1pm-2pm, Journey Room

## 2018-11-29 NOTE — Progress Notes (Signed)
[place], [place] [address]   CLINIC:  Medical Oncology/Hematology  PCP:  [name], MD [place] [address] [phone]   REASON FOR VISIT:  Follow-up for metastatic carcinoid tumor  CURRENT THERAPY: Everolimus 5 mg and Sandostatin     INTERVAL HISTORY:  Allen Davenport 78 y.o. male returns for routine follow-up. He is here today alone. He states that he was given a fluid pill that he takes every other day for swelling in his legs. He states that he has been doing well since his last visit. Denies any nausea, vomiting, or diarrhea. Denies any new pains. Had not noticed any recent bleeding such as epistaxis, hematuria or hematochezia. Denies recent chest pain on exertion, shortness of breath on minimal exertion, pre-syncopal episodes, or palpitations. Denies any numbness or tingling in hands or feet. Denies any recent fevers, infections, or recent hospitalizations. Patient reports appetite at 75% and energy level at 75%.     REVIEW OF SYSTEMS:  Review of Systems  All other systems reviewed and are negative.    PAST MEDICAL/SURGICAL HISTORY:  Past Medical History:  Diagnosis Date  . Anemia   . Cancer [facility])    right renal . Prostate- Radiation treatment  . CHF (congestive heart failure) ([place])   . Chronic kidney disease    stage 4  . Edema   . Heart murmur    "nothing to worry about"  . Hyperlipidemia   . Hypertension   . Malignant carcinoid tumor ([place]) 01/03/2016   Past Surgical History:  Procedure Laterality Date  . AV FISTULA PLACEMENT Left 10/20/2014   Procedure: Left Arm ARTERIOVENOUS (AV) FISTULA CREATION;  Surgeon: Elam Dutch, MD;  Location: [place];  Service: Vascular;  Laterality: Left;  . NEPHRECTOMY Right 2010     SOCIAL HISTORY:  Social History   Socioeconomic History  . Marital status: Married    Spouse name: Not on file  . Number of children: Not on file  . Years of education: Not on  file  . Highest education level: Not on file  Occupational History  . Not on file  Social Needs  . Financial resource strain: Not on file  . Food insecurity:    Worry: Not on file    Inability: Not on file  . Transportation needs:    Medical: Not on file    Non-medical: Not on file  Tobacco Use  . Smoking status: Never Smoker  . Smokeless tobacco: Never Used  Substance and Sexual Activity  . Alcohol use: No    Alcohol/week: 0.0 standard drinks  . Drug use: No  . Sexual activity: Not on file  Lifestyle  . Physical activity:    Days per week: Not on file    Minutes per session: Not on file  . Stress: Not on file  Relationships  . Social connections:    Talks on phone: Not on file    Gets together: Not on file    Attends religious service: Not on file    Active member of club or organization: Not on file    Attends meetings of clubs or organizations: Not on file    Relationship status: Not on file  . Intimate partner violence:    Fear of current or ex partner: Not on file    Emotionally abused: Not on file    Physically abused: Not on file    Forced sexual activity: Not on file  Other Topics Concern  . Not on file  Social History Narrative  . Not on file    FAMILY HISTORY:  Family History  Problem Relation Age of Onset  . Cancer Mother   . Hypertension Father   . Cancer Sister     CURRENT MEDICATIONS:  Outpatient Encounter Medications as of 11/29/2018  Medication Sig Note  . alendronate (FOSAMAX) 70 MG tablet TAKE ONE TABLET BY MOUTH ONCE A WEEK IN THE MORNING WITH A FULL GLASS OF WATER ON AN EMPTY STOMACH. DO NOT LAY DOWN FOR 30 MINUTES. 07/19/2016: Received from: External Pharmacy  . amLODipine (NORVASC) 10 MG tablet Take 10 mg by mouth daily.    [name] atorvastatin (LIPITOR) 80 MG tablet Take 80 mg by mouth daily at 6 PM.    . bisoprolol-hydrochlorothiazide (ZIAC) 10-6.25 MG tablet Take 1 tablet by mouth daily.    . calcitRIOL (ROCALTROL) 0.25 MCG capsule Take 0.25  mcg by mouth daily.  04/26/2016: Received from: External Pharmacy  . calcium acetate (PHOSLO) 667 MG capsule Take 667 mg by mouth 3 (three) times daily with meals.    . calcium carbonate (OS-CAL - DOSED IN MG OF ELEMENTAL CALCIUM) 1250 (500 Ca) MG tablet Take 1 tablet by mouth daily with breakfast.    . carvedilol (COREG) 3.125 MG tablet Take 3.125 mg by mouth 2 (two) times daily with a meal.    . cholecalciferol (VITAMIN D) 1000 UNITS tablet Take 1,000 Units by mouth daily.   [name] everolimus (AFINITOR) 5 MG tablet Take 1 tablet (5 mg total) by mouth daily.   [name] FERREX 150 150 MG capsule Take 150 mg by mouth 2 (two) times daily.   . furosemide (LASIX) 40 MG tablet Take 40 mg by mouth.   . hydrALAZINE (APRESOLINE) 25 MG tablet TAKE THREE (3) TABLETS BY MOUTH TWICE DAILY 07/19/2016: Received from: External Pharmacy  . labetalol (NORMODYNE) 300 MG tablet Take 300 mg by mouth 2 (two) times daily.   [name] levocetirizine (XYZAL) 5 MG tablet Take 5 mg by mouth every evening.    [name] octreotide (SANDOSTATIN LAR DEPOT) 30 MG injection Inject into the muscle.   . Omega-3 Fatty Acids (FISH OIL) 1000 MG CAPS Take 1 capsule by mouth daily.   . potassium chloride SA (K-DUR,KLOR-CON) 20 MEQ tablet Take 20 mEq by mouth once.    . sodium bicarbonate 650 MG tablet Take 650 mg by mouth daily.   . tamsulosin (FLOMAX) 0.4 MG CAPS capsule Take 0.4 mg by mouth daily.  04/26/2016: Received from: External Pharmacy   Facility-Administered Encounter Medications as of 11/29/2018  Medication  . Gallium Ga 68 Dotatate (NETSPOT) KIT 4.5 millicurie    ALLERGIES:  No Known Allergies   PHYSICAL EXAM:  ECOG Performance status: 1  Vitals:   11/29/18 0815  BP: 118/60  Pulse: 62  Resp: 18  Temp: 97.9 F (36.6 C)  SpO2: 100%   Filed Weights   11/29/18 0815  Weight: 198 lb (89.8 kg)    Physical Exam Vitals signs reviewed.  Constitutional:      Appearance: Normal appearance.  Cardiovascular:     Rate and Rhythm: Normal  rate and regular rhythm.     Heart sounds: Normal heart sounds.  Pulmonary:     Effort: Pulmonary effort is normal.     Breath sounds: Normal breath sounds.  Abdominal:     General: There is no distension.     Palpations: Abdomen is soft. There is no mass.  Musculoskeletal:  General: No swelling.  Skin:    General: Skin is warm.  Neurological:     General: No focal deficit present.     Mental Status: He is alert and oriented to person, place, and time.  Psychiatric:        Mood and Affect: Mood normal.        Behavior: Behavior normal.      LABORATORY DATA:  I have reviewed the labs as listed.  CBC    Component Value Date/Time   WBC 3.3 (L) 11/29/2018 0756   RBC 3.71 (L) 11/29/2018 0756   HGB 9.8 (L) 11/29/2018 0756   HCT 31.3 (L) 11/29/2018 0756   PLT 105 (L) 11/29/2018 0756   MCV 84.4 11/29/2018 0756   MCH 26.4 11/29/2018 0756   MCHC 31.3 11/29/2018 0756   RDW 13.9 11/29/2018 0756   LYMPHSABS 0.3 (L) 11/29/2018 0756   MONOABS 0.4 11/29/2018 0756   EOSABS 0.1 11/29/2018 0756   BASOSABS 0.0 11/29/2018 0756   CMP Latest Ref Rng & Units 11/29/2018 10/29/2018 09/26/2018  Glucose 70 - 99 mg/dL 117(H) 90 183(H)  BUN 8 - 23 mg/dL 68(H) 41(H) 50(H)  Creatinine 0.61 - 1.24 mg/dL 4.67(H) 3.78(H) 4.02(H)  Sodium 135 - 145 mmol/L 137 135 136  Potassium 3.5 - 5.1 mmol/L 3.0(L) 3.5 3.7  Chloride 98 - 111 mmol/L 95(L) 103 104  CO2 22 - 32 mmol/L 29 25 22   Calcium 8.9 - 10.3 mg/dL 8.3(L) 8.3(L) 8.1(L)  Total Protein 6.5 - 8.1 g/dL 6.1(L) 6.2(L) 6.2(L)  Total Bilirubin 0.3 - 1.2 mg/dL 0.9 0.7 0.5  Alkaline Phos 38 - 126 U/L 63 69 70  AST 15 - 41 U/L 67(H) 45(H) 51(H)  ALT 0 - 44 U/L 26 21 28        DIAGNOSTIC IMAGING:  I have independently reviewed the scans and discussed with the patient.   I have reviewed Venita Lick LPN's note and agree with the documentation.  I personally performed a face-to-face visit, made revisions and my assessment and plan is as follows.     ASSESSMENT & PLAN:   Malignant carcinoid tumor ([place]) 1.  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  Initially managed at [place] center.  Transferred care to [facility] in October 2017. - Gallium-68 scan on 10/23/2017 shows metastatic disease in the liver, central right mesenteric mass and multiple peritoneal deposits. -Gallium-68 dotatate PET scan on 04/22/2018 showed multiple foci of intense radiotracer activity consistent with metastatic well-differentiated carcinoid tumor.  Metastatic sites include central mesentery, multiple peritoneal implants, liver metastasis.  Several metastatic lesions have measured higher activity.  Additional mild increase in size of central mesenteric mass.  Lesion within the right ventricle along the interventricular septum thought to be metastatic. - 2D echo on 05/03/2018 showed normal cavity size of the right ventricle with normal systolic function.  -Everolimus 5 mg daily started on 05/17/2018 along with dexamethasone mouthwash. -He denies any symptoms of carcinoid syndrome including flushing, wheezing or diarrhea. -Denies any abdominal pains.  He is tolerating everolimus very well. - Gallium-68 dotatate scan on 11/27/2018 shows overall stable well-differentiated neuroendocrine tumor metastasis at multiple sites in the liver, peritoneum and potentially heart.  Stable activity of liver lesions.  Decrease in size and activity of the right central mesenteric mass.  Decrease in activity of peritoneal implants. - We will continue the same dose of everolimus at this time.  He will continue monthly Sandostatin  injections.  2.  Normocytic anemia: -Combination anemia from CKD, iron deficiency and bone marrow suppression from everolimus. -Last Feraheme on 08/26/2018. -Ferritin was 362, percent saturation was 17.  I have recommended 1 more dose of Feraheme. -He was started on Procrit 20,000 units every 2 weeks  on 09/26/2018.    Total time spent is 25 minutes with more than 50% of the time spent face-to-face discussing treatment plan, scan results and coordination of care.  Orders placed this encounter:  Orders Placed This Encounter  Procedures  . Iron and TIBC  . Ferritin  . Chromogranin A  . CBC with Differential/Platelet  . Comprehensive metabolic panel  . Magnesium      Derek Jack, MD [place] [phone]

## 2018-11-29 NOTE — Progress Notes (Signed)
Patient tolerated injection with no complaints voiced.  Site clean and dry with no bruising or swelling noted at site.  Band aid applied.  Vss with discharge and left ambulatory with no s/s of distress noted.  

## 2018-12-02 LAB — SEROTONIN SERUM: Serotonin, Serum: 477 ng/mL — ABNORMAL HIGH (ref 21–321)

## 2018-12-02 LAB — CHROMOGRANIN A: Chromogranin A (ng/mL): 442.1 ng/mL — ABNORMAL HIGH (ref 0.0–101.8)

## 2018-12-04 DIAGNOSIS — E1151 Type 2 diabetes mellitus with diabetic peripheral angiopathy without gangrene: Secondary | ICD-10-CM | POA: Diagnosis not present

## 2018-12-04 DIAGNOSIS — E1140 Type 2 diabetes mellitus with diabetic neuropathy, unspecified: Secondary | ICD-10-CM | POA: Diagnosis not present

## 2018-12-05 DIAGNOSIS — E876 Hypokalemia: Secondary | ICD-10-CM | POA: Diagnosis not present

## 2018-12-05 DIAGNOSIS — D638 Anemia in other chronic diseases classified elsewhere: Secondary | ICD-10-CM | POA: Diagnosis not present

## 2018-12-05 DIAGNOSIS — I10 Essential (primary) hypertension: Secondary | ICD-10-CM | POA: Diagnosis not present

## 2018-12-05 DIAGNOSIS — E8889 Other specified metabolic disorders: Secondary | ICD-10-CM | POA: Diagnosis not present

## 2018-12-05 DIAGNOSIS — N185 Chronic kidney disease, stage 5: Secondary | ICD-10-CM | POA: Diagnosis not present

## 2018-12-06 ENCOUNTER — Other Ambulatory Visit: Payer: Self-pay

## 2018-12-06 ENCOUNTER — Inpatient Hospital Stay (HOSPITAL_COMMUNITY): Payer: Medicare Other

## 2018-12-06 VITALS — BP 107/51 | HR 54 | Temp 97.8°F | Resp 18

## 2018-12-06 DIAGNOSIS — D509 Iron deficiency anemia, unspecified: Secondary | ICD-10-CM | POA: Diagnosis not present

## 2018-12-06 DIAGNOSIS — C7A00 Malignant carcinoid tumor of unspecified site: Secondary | ICD-10-CM

## 2018-12-06 DIAGNOSIS — N189 Chronic kidney disease, unspecified: Secondary | ICD-10-CM | POA: Diagnosis not present

## 2018-12-06 DIAGNOSIS — D631 Anemia in chronic kidney disease: Secondary | ICD-10-CM | POA: Diagnosis not present

## 2018-12-06 DIAGNOSIS — C7B02 Secondary carcinoid tumors of liver: Secondary | ICD-10-CM | POA: Diagnosis not present

## 2018-12-06 MED ORDER — SODIUM CHLORIDE 0.9 % IV SOLN
510.0000 mg | Freq: Once | INTRAVENOUS | Status: AC
  Administered 2018-12-06: 510 mg via INTRAVENOUS
  Filled 2018-12-06: qty 510

## 2018-12-06 MED ORDER — SODIUM CHLORIDE 0.9 % IV SOLN
INTRAVENOUS | Status: DC
  Administered 2018-12-06: 09:00:00 via INTRAVENOUS

## 2018-12-06 NOTE — Patient Instructions (Signed)
[facility] at [facility]  Discharge Instructions:   _______________________________________________________________  Thank you for choosing [facility] at [facility] to provide your oncology and hematology care.  To afford each patient quality time with our providers, please arrive at least 15 minutes before your scheduled appointment.  You need to re-schedule your appointment if you arrive 10 or more minutes late.  We strive to give you quality time with our providers, and arriving late affects you and other patients whose appointments are after yours.  Also, if you no show three or more times for appointments you may be dismissed from the clinic.  Again, thank you for choosing [facility] at [facility]. Our hope is that these requests will allow you access to exceptional care and in a timely manner. _______________________________________________________________  If you have questions after your visit, please contact our office at [phone] between the hours of 8:30 a.m. and 5:00 p.m. Voicemails left after 4:30 p.m. will not be returned until the following business day. _______________________________________________________________  For prescription refill requests, have your pharmacy contact our office. _______________________________________________________________  Recommendations made by the consultant and any test results will be sent to your referring physician. _______________________________________________________________ 

## 2018-12-06 NOTE — Progress Notes (Signed)
Feraheme given per orders. Patient tolerated it well without problems. Vitals stable and discharged home from clinic ambulatory. Follow up as scheduled.  

## 2018-12-13 ENCOUNTER — Other Ambulatory Visit: Payer: Self-pay

## 2018-12-13 ENCOUNTER — Inpatient Hospital Stay (HOSPITAL_COMMUNITY): Payer: Medicare Other

## 2018-12-13 VITALS — BP 108/59 | HR 63 | Temp 98.3°F | Resp 18

## 2018-12-13 DIAGNOSIS — C7B02 Secondary carcinoid tumors of liver: Secondary | ICD-10-CM | POA: Diagnosis not present

## 2018-12-13 DIAGNOSIS — C7A00 Malignant carcinoid tumor of unspecified site: Secondary | ICD-10-CM

## 2018-12-13 DIAGNOSIS — D509 Iron deficiency anemia, unspecified: Secondary | ICD-10-CM | POA: Diagnosis not present

## 2018-12-13 DIAGNOSIS — N189 Chronic kidney disease, unspecified: Secondary | ICD-10-CM | POA: Diagnosis not present

## 2018-12-13 DIAGNOSIS — D631 Anemia in chronic kidney disease: Secondary | ICD-10-CM | POA: Diagnosis not present

## 2018-12-13 LAB — CBC WITH DIFFERENTIAL/PLATELET
Abs Immature Granulocytes: 0.01 10*3/uL (ref 0.00–0.07)
Basophils Absolute: 0 10*3/uL (ref 0.0–0.1)
Basophils Relative: 1 %
Eosinophils Absolute: 0.1 10*3/uL (ref 0.0–0.5)
Eosinophils Relative: 2 %
HCT: 29.4 % — ABNORMAL LOW (ref 39.0–52.0)
Hemoglobin: 9.3 g/dL — ABNORMAL LOW (ref 13.0–17.0)
Immature Granulocytes: 0 %
Lymphocytes Relative: 9 %
Lymphs Abs: 0.3 10*3/uL — ABNORMAL LOW (ref 0.7–4.0)
MCH: 26.4 pg (ref 26.0–34.0)
MCHC: 31.6 g/dL (ref 30.0–36.0)
MCV: 83.5 fL (ref 80.0–100.0)
Monocytes Absolute: 0.6 10*3/uL (ref 0.1–1.0)
Monocytes Relative: 16 %
Neutro Abs: 2.6 10*3/uL (ref 1.7–7.7)
Neutrophils Relative %: 72 %
Platelets: 127 10*3/uL — ABNORMAL LOW (ref 150–400)
RBC: 3.52 MIL/uL — ABNORMAL LOW (ref 4.22–5.81)
RDW: 14.4 % (ref 11.5–15.5)
WBC: 3.6 10*3/uL — ABNORMAL LOW (ref 4.0–10.5)
nRBC: 0 % (ref 0.0–0.2)

## 2018-12-13 LAB — COMPREHENSIVE METABOLIC PANEL
ALT: 23 U/L (ref 0–44)
AST: 52 U/L — ABNORMAL HIGH (ref 15–41)
Albumin: 3.1 g/dL — ABNORMAL LOW (ref 3.5–5.0)
Alkaline Phosphatase: 63 U/L (ref 38–126)
Anion gap: 12 (ref 5–15)
BUN: 53 mg/dL — ABNORMAL HIGH (ref 8–23)
CO2: 28 mmol/L (ref 22–32)
Calcium: 8.6 mg/dL — ABNORMAL LOW (ref 8.9–10.3)
Chloride: 98 mmol/L (ref 98–111)
Creatinine, Ser: 4.46 mg/dL — ABNORMAL HIGH (ref 0.61–1.24)
GFR calc Af Amer: 14 mL/min — ABNORMAL LOW (ref >60)
GFR calc non Af Amer: 12 mL/min — ABNORMAL LOW (ref >60)
Glucose, Bld: 112 mg/dL — ABNORMAL HIGH (ref 70–99)
Potassium: 2.8 mmol/L — ABNORMAL LOW (ref 3.5–5.1)
Sodium: 138 mmol/L (ref 135–145)
Total Bilirubin: 0.7 mg/dL (ref 0.3–1.2)
Total Protein: 6 g/dL — ABNORMAL LOW (ref 6.5–8.1)

## 2018-12-13 MED ORDER — POTASSIUM CHLORIDE CRYS ER 20 MEQ PO TBCR
40.0000 meq | EXTENDED_RELEASE_TABLET | Freq: Once | ORAL | Status: AC
  Administered 2018-12-13: 40 meq via ORAL
  Filled 2018-12-13: qty 2

## 2018-12-13 MED ORDER — OCTREOTIDE ACETATE 30 MG IM KIT
30.0000 mg | PACK | Freq: Once | INTRAMUSCULAR | Status: AC
  Administered 2018-12-13: 09:00:00 30 mg via INTRAMUSCULAR

## 2018-12-13 MED ORDER — EPOETIN ALFA 20000 UNIT/ML IJ SOLN
20000.0000 [IU] | INTRAMUSCULAR | Status: DC
  Administered 2018-12-13: 20000 [IU] via SUBCUTANEOUS
  Filled 2018-12-13: qty 1

## 2018-12-13 NOTE — Progress Notes (Signed)
Reviewed potassium 2.8 today with Dr. Delton Coombes with verbal order to give potassium 40 meq by mouth.    Patient tolerated injection with no complaints voiced.  Site clean and dry with no bruising or swelling noted at site.  Band aid applied.  Vss with discharge and left ambulatory with no s/s of distress noted.

## 2018-12-18 DIAGNOSIS — I10 Essential (primary) hypertension: Secondary | ICD-10-CM | POA: Diagnosis not present

## 2018-12-18 DIAGNOSIS — I5032 Chronic diastolic (congestive) heart failure: Secondary | ICD-10-CM | POA: Diagnosis not present

## 2018-12-18 DIAGNOSIS — N184 Chronic kidney disease, stage 4 (severe): Secondary | ICD-10-CM | POA: Diagnosis not present

## 2018-12-24 DIAGNOSIS — R609 Edema, unspecified: Secondary | ICD-10-CM | POA: Diagnosis not present

## 2018-12-26 ENCOUNTER — Other Ambulatory Visit: Payer: Self-pay

## 2018-12-26 DIAGNOSIS — Z8521 Personal history of malignant neoplasm of larynx: Secondary | ICD-10-CM | POA: Diagnosis not present

## 2018-12-26 DIAGNOSIS — H6121 Impacted cerumen, right ear: Secondary | ICD-10-CM | POA: Diagnosis not present

## 2018-12-27 ENCOUNTER — Inpatient Hospital Stay (HOSPITAL_COMMUNITY): Payer: Medicare Other

## 2018-12-27 ENCOUNTER — Other Ambulatory Visit: Payer: Self-pay

## 2018-12-27 ENCOUNTER — Inpatient Hospital Stay (HOSPITAL_COMMUNITY): Payer: Medicare Other | Attending: Hematology

## 2018-12-27 ENCOUNTER — Inpatient Hospital Stay (HOSPITAL_BASED_OUTPATIENT_CLINIC_OR_DEPARTMENT_OTHER): Payer: Medicare Other | Admitting: Hematology

## 2018-12-27 DIAGNOSIS — C7B02 Secondary carcinoid tumors of liver: Secondary | ICD-10-CM | POA: Insufficient documentation

## 2018-12-27 DIAGNOSIS — D631 Anemia in chronic kidney disease: Secondary | ICD-10-CM | POA: Insufficient documentation

## 2018-12-27 DIAGNOSIS — C7A00 Malignant carcinoid tumor of unspecified site: Secondary | ICD-10-CM

## 2018-12-27 DIAGNOSIS — N189 Chronic kidney disease, unspecified: Secondary | ICD-10-CM | POA: Diagnosis not present

## 2018-12-27 DIAGNOSIS — Z79899 Other long term (current) drug therapy: Secondary | ICD-10-CM | POA: Insufficient documentation

## 2018-12-27 DIAGNOSIS — I509 Heart failure, unspecified: Secondary | ICD-10-CM | POA: Diagnosis not present

## 2018-12-27 DIAGNOSIS — I130 Hypertensive heart and chronic kidney disease with heart failure and stage 1 through stage 4 chronic kidney disease, or unspecified chronic kidney disease: Secondary | ICD-10-CM | POA: Diagnosis not present

## 2018-12-27 DIAGNOSIS — R011 Cardiac murmur, unspecified: Secondary | ICD-10-CM | POA: Diagnosis not present

## 2018-12-27 DIAGNOSIS — E785 Hyperlipidemia, unspecified: Secondary | ICD-10-CM | POA: Diagnosis not present

## 2018-12-27 DIAGNOSIS — D509 Iron deficiency anemia, unspecified: Secondary | ICD-10-CM | POA: Diagnosis not present

## 2018-12-27 DIAGNOSIS — R197 Diarrhea, unspecified: Secondary | ICD-10-CM | POA: Diagnosis not present

## 2018-12-27 DIAGNOSIS — E611 Iron deficiency: Secondary | ICD-10-CM | POA: Diagnosis not present

## 2018-12-27 DIAGNOSIS — N184 Chronic kidney disease, stage 4 (severe): Secondary | ICD-10-CM | POA: Diagnosis not present

## 2018-12-27 LAB — CBC WITH DIFFERENTIAL/PLATELET
Abs Immature Granulocytes: 0.01 10*3/uL (ref 0.00–0.07)
Basophils Absolute: 0 10*3/uL (ref 0.0–0.1)
Basophils Relative: 1 %
Eosinophils Absolute: 0.1 10*3/uL (ref 0.0–0.5)
Eosinophils Relative: 2 %
HCT: 32 % — ABNORMAL LOW (ref 39.0–52.0)
Hemoglobin: 10 g/dL — ABNORMAL LOW (ref 13.0–17.0)
Immature Granulocytes: 0 %
Lymphocytes Relative: 10 %
Lymphs Abs: 0.4 10*3/uL — ABNORMAL LOW (ref 0.7–4.0)
MCH: 26.1 pg (ref 26.0–34.0)
MCHC: 31.3 g/dL (ref 30.0–36.0)
MCV: 83.6 fL (ref 80.0–100.0)
Monocytes Absolute: 0.6 10*3/uL (ref 0.1–1.0)
Monocytes Relative: 17 %
Neutro Abs: 2.6 10*3/uL (ref 1.7–7.7)
Neutrophils Relative %: 70 %
Platelets: 116 10*3/uL — ABNORMAL LOW (ref 150–400)
RBC: 3.83 MIL/uL — ABNORMAL LOW (ref 4.22–5.81)
RDW: 14.5 % (ref 11.5–15.5)
WBC: 3.7 10*3/uL — ABNORMAL LOW (ref 4.0–10.5)
nRBC: 0 % (ref 0.0–0.2)

## 2018-12-27 LAB — COMPREHENSIVE METABOLIC PANEL
ALT: 22 U/L (ref 0–44)
AST: 50 U/L — ABNORMAL HIGH (ref 15–41)
Albumin: 3.4 g/dL — ABNORMAL LOW (ref 3.5–5.0)
Alkaline Phosphatase: 67 U/L (ref 38–126)
Anion gap: 11 (ref 5–15)
BUN: 53 mg/dL — ABNORMAL HIGH (ref 8–23)
CO2: 30 mmol/L (ref 22–32)
Calcium: 8.4 mg/dL — ABNORMAL LOW (ref 8.9–10.3)
Chloride: 98 mmol/L (ref 98–111)
Creatinine, Ser: 4.44 mg/dL — ABNORMAL HIGH (ref 0.61–1.24)
GFR calc Af Amer: 14 mL/min — ABNORMAL LOW (ref >60)
GFR calc non Af Amer: 12 mL/min — ABNORMAL LOW (ref >60)
Glucose, Bld: 114 mg/dL — ABNORMAL HIGH (ref 70–99)
Potassium: 3.1 mmol/L — ABNORMAL LOW (ref 3.5–5.1)
Sodium: 139 mmol/L (ref 135–145)
Total Bilirubin: 1 mg/dL (ref 0.3–1.2)
Total Protein: 6.1 g/dL — ABNORMAL LOW (ref 6.5–8.1)

## 2018-12-27 LAB — IRON AND TIBC
Iron: 36 ug/dL — ABNORMAL LOW (ref 45–182)
Saturation Ratios: 18 % (ref 17.9–39.5)
TIBC: 195 ug/dL — ABNORMAL LOW (ref 250–450)
UIBC: 159 ug/dL

## 2018-12-27 LAB — MAGNESIUM: Magnesium: 2 mg/dL (ref 1.7–2.4)

## 2018-12-27 LAB — FERRITIN: Ferritin: 572 ng/mL — ABNORMAL HIGH (ref 24–336)

## 2018-12-27 MED ORDER — EPOETIN ALFA 20000 UNIT/ML IJ SOLN
INTRAMUSCULAR | Status: AC
  Filled 2018-12-27: qty 1

## 2018-12-27 MED ORDER — EPOETIN ALFA 20000 UNIT/ML IJ SOLN
20000.0000 [IU] | INTRAMUSCULAR | Status: DC
  Administered 2018-12-27: 11:00:00 20000 [IU] via SUBCUTANEOUS

## 2018-12-27 NOTE — Patient Instructions (Signed)
[facility] at [facility] Discharge Instructions  Received Procrit injection today. Follow-up as scheduled. Call clinic for any questions or concerns   Thank you for choosing [facility] at [facility] to provide your oncology and hematology care.  To afford each patient quality time with our provider, please arrive at least 15 minutes before your scheduled appointment time.   If you have a lab appointment with the Cancer Center please come in thru the  Main Entrance and check in at the main information desk  You need to re-schedule your appointment should you arrive 10 or more minutes late.  We strive to give you quality time with our providers, and arriving late affects you and other patients whose appointments are after yours.  Also, if you no show three or more times for appointments you may be dismissed from the clinic at the providers discretion.     Again, thank you for choosing [facility].  Our hope is that these requests will decrease the amount of time that you wait before being seen by our physicians.       _____________________________________________________________  Should you have questions after your visit to [facility], please contact our office at [phone] between the hours of 8:00 a.m. and 4:30 p.m.  Voicemails left after 4:00 p.m. will not be returned until the following business day.  For prescription refill requests, have your pharmacy contact our office and allow 72 hours.    Cancer Center Support Programs:   > Cancer Support Group  2nd Tuesday of the month 1pm-2pm, Journey Room   

## 2018-12-27 NOTE — Assessment & Plan Note (Addendum)
1.  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  Initially managed at [place] center.  Transferred care to [facility] in October 2017. - Gallium-68 scan on 10/23/2017 shows metastatic disease in the liver, central right mesenteric mass and multiple peritoneal deposits. -Gallium-68 dotatate PET scan on 04/22/2018 showed multiple foci of intense radiotracer activity consistent with metastatic well-differentiated carcinoid tumor.  Metastatic sites include central mesentery, multiple peritoneal implants, liver metastasis.  Several metastatic lesions have measured higher activity.  Additional mild increase in size of central mesenteric mass.  Lesion within the right ventricle along the interventricular septum thought to be metastatic. - 2D echo on 05/03/2018 showed normal cavity size of the right ventricle with normal systolic function.  -Everolimus 5 mg daily started on 05/17/2018 along with dexamethasone mouthwash. -He denies any symptoms of carcinoid syndrome including flushing, wheezing or diarrhea. -Denies any abdominal pains.  He is tolerating everolimus very well. - Gallium-68 dotatate scan on 11/27/2018 shows overall stable well-differentiated neuroendocrine tumor metastasis at multiple sites in the liver, peritoneum and potentially heart.  Stable activity of liver lesions.  Decrease in size and activity of the right central mesenteric mass.  Decrease in activity of peritoneal implants. - We will continue the same dose of everolimus at this time.  He will continue monthly Sandostatin injections. - Chromogranin A: pending - RTC in 4 weeks.   2.  Normocytic anemia: -Combination anemia from CKD, iron deficiency and bone marrow suppression from everolimus. -He was started on Procrit 20,000 units every 2 weeks on 09/26/2018. - Hemoglobin remains stable with procrit 20,000 units.

## 2018-12-27 NOTE — Progress Notes (Signed)
[place], [place] [address]   CLINIC:  Medical Oncology/Hematology  PCP:  Allen Burly, MD [place] [id number] [phone]   REASON FOR VISIT:  Follow-up for  Metastatic carcinoid tumor to the liver and peritoneum  CURRENT THERAPY: Sandostatin   INTERVAL HISTORY:  Allen Davenport 78 y.o. male presents today for follow up.  He reports overall doing well.  He denies any significant fatigue.  Denies any significant diarrhea.  Diarrhea is managed with as needed Imodium.  Denies any fevers, chills, night sweats.  Denies any weight loss.  Denies any new cough or shortness of breath.  He is here for repeat labs and office visit.   REVIEW OF SYSTEMS:  Review of Systems  Constitutional: Negative.   HENT:  Negative.   Eyes: Negative.   Respiratory: Negative.   Cardiovascular: Negative.   Gastrointestinal: Positive for diarrhea.  Endocrine: Negative.   Genitourinary: Negative.    Musculoskeletal: Negative.   Skin: Negative.   Neurological: Negative.   Hematological: Negative.   Psychiatric/Behavioral: Negative.      PAST MEDICAL/SURGICAL HISTORY:  Past Medical History:  Diagnosis Date  . Anemia   . Cancer [facility])    right renal . Prostate- Radiation treatment  . CHF (congestive heart failure) ([place])   . Chronic kidney disease    stage 4  . Edema   . Heart murmur    "nothing to worry about"  . Hyperlipidemia   . Hypertension   . Malignant carcinoid tumor ([place]) 01/03/2016   Past Surgical History:  Procedure Laterality Date  . AV FISTULA PLACEMENT Left 10/20/2014   Procedure: Left Arm ARTERIOVENOUS (AV) FISTULA CREATION;  Surgeon: Elam Dutch, MD;  Location: [facility];  Service: Vascular;  Laterality: Left;  . NEPHRECTOMY Right 2010     SOCIAL HISTORY:  Social History   Socioeconomic History  . Marital status: Married    Spouse name: Not on file  . Number of children: Not on file  . Years of education: Not  on file  . Highest education level: Not on file  Occupational History  . Not on file  Social Needs  . Financial resource strain: Not on file  . Food insecurity    Worry: Not on file    Inability: Not on file  . Transportation needs    Medical: Not on file    Non-medical: Not on file  Tobacco Use  . Smoking status: Never Smoker  . Smokeless tobacco: Never Used  Substance and Sexual Activity  . Alcohol use: No    Alcohol/week: 0.0 standard drinks  . Drug use: No  . Sexual activity: Not on file  Lifestyle  . Physical activity    Days per week: Not on file    Minutes per session: Not on file  . Stress: Not on file  Relationships  . Social Herbalist on phone: Not on file    Gets together: Not on file    Attends religious service: Not on file    Active member of club or organization: Not on file    Attends meetings of clubs or organizations: Not on file    Relationship status: Not on file  . Intimate partner violence    Fear of current or ex partner: Not on file    Emotionally abused: Not on file    Physically abused: Not on file    Forced sexual activity: Not on  file  Other Topics Concern  . Not on file  Social History Narrative  . Not on file    FAMILY HISTORY:  Family History  Problem Relation Age of Onset  . Cancer Mother   . Hypertension Father   . Cancer Sister     CURRENT MEDICATIONS:  Outpatient Encounter Medications as of 12/27/2018  Medication Sig Note  . alendronate (FOSAMAX) 70 MG tablet TAKE ONE TABLET BY MOUTH ONCE A WEEK IN THE MORNING WITH A FULL GLASS OF WATER ON AN EMPTY STOMACH. DO NOT LAY DOWN FOR 30 MINUTES. 07/19/2016: Received from: External Pharmacy  . amLODipine (NORVASC) 10 MG tablet Take 10 mg by mouth daily.    [name] atorvastatin (LIPITOR) 80 MG tablet Take 80 mg by mouth daily at 6 PM.    . bisoprolol-hydrochlorothiazide (ZIAC) 10-6.25 MG tablet Take 1 tablet by mouth daily.    . calcitRIOL (ROCALTROL) 0.25 MCG capsule Take 0.25  mcg by mouth daily.  04/26/2016: Received from: External Pharmacy  . calcium acetate (PHOSLO) 667 MG capsule Take 667 mg by mouth 3 (three) times daily with meals.    . calcium carbonate (OS-CAL - DOSED IN MG OF ELEMENTAL CALCIUM) 1250 (500 Ca) MG tablet Take 1 tablet by mouth daily with breakfast.    . carvedilol (COREG) 3.125 MG tablet Take 3.125 mg by mouth 2 (two) times daily with a meal.    . cholecalciferol (VITAMIN D) 1000 UNITS tablet Take 1,000 Units by mouth daily.   [name] everolimus (AFINITOR) 5 MG tablet Take 1 tablet (5 mg total) by mouth daily.   [name] FERREX 150 150 MG capsule Take 150 mg by mouth 2 (two) times daily.   . furosemide (LASIX) 40 MG tablet Take 40 mg by mouth.   . hydrALAZINE (APRESOLINE) 25 MG tablet TAKE THREE (3) TABLETS BY MOUTH TWICE DAILY 07/19/2016: Received from: External Pharmacy  . labetalol (NORMODYNE) 300 MG tablet Take 300 mg by mouth 2 (two) times daily.   [name] levocetirizine (XYZAL) 5 MG tablet Take 5 mg by mouth every evening.    [name] octreotide (SANDOSTATIN LAR DEPOT) 30 MG injection Inject into the muscle.   . Omega-3 Fatty Acids (FISH OIL) 1000 MG CAPS Take 1 capsule by mouth daily.   . potassium chloride SA (K-DUR,KLOR-CON) 20 MEQ tablet Take 20 mEq by mouth once.    . sodium bicarbonate 650 MG tablet Take 650 mg by mouth daily.   . tamsulosin (FLOMAX) 0.4 MG CAPS capsule Take 0.4 mg by mouth daily.  04/26/2016: Received from: External Pharmacy   No facility-administered encounter medications on file as of 12/27/2018.     ALLERGIES:  No Known Allergies   PHYSICAL EXAM:  ECOG Performance status: 1  Vitals:   12/27/18 0959  BP: 106/62  Pulse: (!) 54  Resp: 18  Temp: 98.6 F (37 C)  SpO2: 100%   Filed Weights   12/27/18 0959  Weight: 193 lb 8 oz (87.8 kg)    Physical Exam Constitutional:      Appearance: Normal appearance. He is normal weight.  HENT:     Head: Normocephalic.     Nose: Nose normal.     Mouth/Throat:     Mouth: Mucous  membranes are moist.  Eyes:     Pupils: Pupils are equal, round, and reactive to light.  Neck:     Musculoskeletal: Normal range of motion.  Cardiovascular:     Rate and Rhythm: Normal rate and regular rhythm.  Pulses: Normal pulses.     Heart sounds: Normal heart sounds.  Pulmonary:     Effort: Pulmonary effort is normal.     Breath sounds: Normal breath sounds.  Abdominal:     General: Bowel sounds are normal.     Palpations: Abdomen is soft.  Musculoskeletal: Normal range of motion.  Skin:    General: Skin is warm and dry.  Neurological:     General: No focal deficit present.     Mental Status: He is alert and oriented to person, place, and time.  Psychiatric:        Mood and Affect: Mood normal.        Thought Content: Thought content normal.        Judgment: Judgment normal.      LABORATORY DATA:  I have reviewed the labs as listed.  CBC    Component Value Date/Time   WBC 3.7 (L) 12/27/2018 0935   RBC 3.83 (L) 12/27/2018 0935   HGB 10.0 (L) 12/27/2018 0935   HCT 32.0 (L) 12/27/2018 0935   PLT 116 (L) 12/27/2018 0935   MCV 83.6 12/27/2018 0935   MCH 26.1 12/27/2018 0935   MCHC 31.3 12/27/2018 0935   RDW 14.5 12/27/2018 0935   LYMPHSABS 0.4 (L) 12/27/2018 0935   MONOABS 0.6 12/27/2018 0935   EOSABS 0.1 12/27/2018 0935   BASOSABS 0.0 12/27/2018 0935   CMP Latest Ref Rng & Units 12/27/2018 12/13/2018 11/29/2018  Glucose 70 - 99 mg/dL 114(H) 112(H) 117(H)  BUN 8 - 23 mg/dL 53(H) 53(H) 68(H)  Creatinine 0.61 - 1.24 mg/dL 4.44(H) 4.46(H) 4.67(H)  Sodium 135 - 145 mmol/L 139 138 137  Potassium 3.5 - 5.1 mmol/L 3.1(L) 2.8(L) 3.0(L)  Chloride 98 - 111 mmol/L 98 98 95(L)  CO2 22 - 32 mmol/L 30 28 29   Calcium 8.9 - 10.3 mg/dL 8.4(L) 8.6(L) 8.3(L)  Total Protein 6.5 - 8.1 g/dL 6.1(L) 6.0(L) 6.1(L)  Total Bilirubin 0.3 - 1.2 mg/dL 1.0 0.7 0.9  Alkaline Phos 38 - 126 U/L 67 63 63  AST 15 - 41 U/L 50(H) 52(H) 67(H)  ALT 0 - 44 U/L 22 23 26       ASSESSMENT &  PLAN:   Malignant carcinoid tumor ([place]) 1.  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  Initially managed at [place] center.  Transferred care to [facility] in October 2017. - Gallium-68 scan on 10/23/2017 shows metastatic disease in the liver, central right mesenteric mass and multiple peritoneal deposits. -Gallium-68 dotatate PET scan on 04/22/2018 showed multiple foci of intense radiotracer activity consistent with metastatic well-differentiated carcinoid tumor.  Metastatic sites include central mesentery, multiple peritoneal implants, liver metastasis.  Several metastatic lesions have measured higher activity.  Additional mild increase in size of central mesenteric mass.  Lesion within the right ventricle along the interventricular septum thought to be metastatic. - 2D echo on 05/03/2018 showed normal cavity size of the right ventricle with normal systolic function.  -Everolimus 5 mg daily started on 05/17/2018 along with dexamethasone mouthwash. -He denies any symptoms of carcinoid syndrome including flushing, wheezing or diarrhea. -Denies any abdominal pains.  He is tolerating everolimus very well. - Gallium-68 dotatate scan on 11/27/2018 shows overall stable well-differentiated neuroendocrine tumor metastasis at multiple sites in the liver, peritoneum and potentially heart.  Stable activity of liver lesions.  Decrease in size and activity of the right central mesenteric mass.  Decrease in activity of peritoneal  implants. - We will continue the same dose of everolimus at this time.  He will continue monthly Sandostatin injections. - Chromogranin A: pending - RTC in 4 weeks.   2.  Normocytic anemia: -Combination anemia from CKD, iron deficiency and bone marrow suppression from everolimus. -He was started on Procrit 20,000 units every 2 weeks on 09/26/2018. - Hemoglobin remains stable with procrit 20,000 units.        Orders  placed this encounter:  Orders Placed This Encounter  Procedures  . CBC with Differential  . Comprehensive metabolic panel      Roger Shelter, [place] [phone]

## 2018-12-27 NOTE — Progress Notes (Signed)
[place] reviewed with and pt seen by Reynolds Bowl NP and pt approved for Procrit injection today per NP                   Collins Scotland A Shrider tolerated Procrit injection well without complaints or incident.Hgb 10 today.VSS Pt discharged self ambulatory in satisfactory condition

## 2018-12-30 LAB — CHROMOGRANIN A: Chromogranin A (ng/mL): 374 ng/mL — ABNORMAL HIGH (ref 0.0–101.8)

## 2019-01-06 ENCOUNTER — Other Ambulatory Visit (HOSPITAL_COMMUNITY): Payer: Self-pay | Admitting: *Deleted

## 2019-01-06 DIAGNOSIS — C7A019 Malignant carcinoid tumor of the small intestine, unspecified portion: Secondary | ICD-10-CM

## 2019-01-06 MED ORDER — EVEROLIMUS 5 MG PO TABS: 5.0000 mg | ORAL_TABLET | Freq: Every day | ORAL | 2 refills | Status: DC

## 2019-01-06 NOTE — Telephone Encounter (Signed)
Per last office note with Harriet Pho, NP, patient to continue Afinitor at same dose. Afinitor refilled.

## 2019-01-10 ENCOUNTER — Other Ambulatory Visit: Payer: Self-pay

## 2019-01-10 ENCOUNTER — Other Ambulatory Visit (HOSPITAL_COMMUNITY): Payer: Medicare Other

## 2019-01-10 ENCOUNTER — Inpatient Hospital Stay (HOSPITAL_COMMUNITY): Payer: Medicare Other

## 2019-01-10 ENCOUNTER — Ambulatory Visit (HOSPITAL_COMMUNITY): Payer: Medicare Other

## 2019-01-10 VITALS — BP 116/60 | HR 56 | Temp 97.3°F | Resp 18

## 2019-01-10 DIAGNOSIS — Z79899 Other long term (current) drug therapy: Secondary | ICD-10-CM | POA: Diagnosis not present

## 2019-01-10 DIAGNOSIS — N184 Chronic kidney disease, stage 4 (severe): Secondary | ICD-10-CM | POA: Diagnosis not present

## 2019-01-10 DIAGNOSIS — C7A00 Malignant carcinoid tumor of unspecified site: Secondary | ICD-10-CM

## 2019-01-10 DIAGNOSIS — D631 Anemia in chronic kidney disease: Secondary | ICD-10-CM | POA: Diagnosis not present

## 2019-01-10 DIAGNOSIS — C7B02 Secondary carcinoid tumors of liver: Secondary | ICD-10-CM | POA: Diagnosis not present

## 2019-01-10 DIAGNOSIS — I130 Hypertensive heart and chronic kidney disease with heart failure and stage 1 through stage 4 chronic kidney disease, or unspecified chronic kidney disease: Secondary | ICD-10-CM | POA: Diagnosis not present

## 2019-01-10 LAB — COMPREHENSIVE METABOLIC PANEL
ALT: 21 U/L (ref 0–44)
AST: 43 U/L — ABNORMAL HIGH (ref 15–41)
Albumin: 3.5 g/dL (ref 3.5–5.0)
Alkaline Phosphatase: 69 U/L (ref 38–126)
Anion gap: 11 (ref 5–15)
BUN: 50 mg/dL — ABNORMAL HIGH (ref 8–23)
CO2: 29 mmol/L (ref 22–32)
Calcium: 8.4 mg/dL — ABNORMAL LOW (ref 8.9–10.3)
Chloride: 99 mmol/L (ref 98–111)
Creatinine, Ser: 4.18 mg/dL — ABNORMAL HIGH (ref 0.61–1.24)
GFR calc Af Amer: 15 mL/min — ABNORMAL LOW (ref >60)
GFR calc non Af Amer: 13 mL/min — ABNORMAL LOW (ref >60)
Glucose, Bld: 118 mg/dL — ABNORMAL HIGH (ref 70–99)
Potassium: 2.8 mmol/L — ABNORMAL LOW (ref 3.5–5.1)
Sodium: 139 mmol/L (ref 135–145)
Total Bilirubin: 0.7 mg/dL (ref 0.3–1.2)
Total Protein: 6.2 g/dL — ABNORMAL LOW (ref 6.5–8.1)

## 2019-01-10 LAB — CBC WITH DIFFERENTIAL/PLATELET
Abs Immature Granulocytes: 0.01 10*3/uL (ref 0.00–0.07)
Basophils Absolute: 0 10*3/uL (ref 0.0–0.1)
Basophils Relative: 1 %
Eosinophils Absolute: 0 10*3/uL (ref 0.0–0.5)
Eosinophils Relative: 0 %
HCT: 32.7 % — ABNORMAL LOW (ref 39.0–52.0)
Hemoglobin: 10.3 g/dL — ABNORMAL LOW (ref 13.0–17.0)
Immature Granulocytes: 0 %
Lymphocytes Relative: 10 %
Lymphs Abs: 0.4 10*3/uL — ABNORMAL LOW (ref 0.7–4.0)
MCH: 26.4 pg (ref 26.0–34.0)
MCHC: 31.5 g/dL (ref 30.0–36.0)
MCV: 83.8 fL (ref 80.0–100.0)
Monocytes Absolute: 0.6 10*3/uL (ref 0.1–1.0)
Monocytes Relative: 15 %
Neutro Abs: 3.1 10*3/uL (ref 1.7–7.7)
Neutrophils Relative %: 74 %
Platelets: 120 10*3/uL — ABNORMAL LOW (ref 150–400)
RBC: 3.9 MIL/uL — ABNORMAL LOW (ref 4.22–5.81)
RDW: 14.9 % (ref 11.5–15.5)
WBC: 4.2 10*3/uL (ref 4.0–10.5)
nRBC: 0 % (ref 0.0–0.2)

## 2019-01-10 MED ORDER — EPOETIN ALFA 20000 UNIT/ML IJ SOLN
20000.0000 [IU] | INTRAMUSCULAR | Status: DC
  Administered 2019-01-10: 10:00:00 20000 [IU] via SUBCUTANEOUS

## 2019-01-10 MED ORDER — OCTREOTIDE ACETATE 30 MG IM KIT
30.0000 mg | PACK | Freq: Once | INTRAMUSCULAR | Status: AC
  Administered 2019-01-10: 30 mg via INTRAMUSCULAR

## 2019-01-10 MED ORDER — OCTREOTIDE ACETATE 20 MG IM KIT
PACK | INTRAMUSCULAR | Status: AC
  Filled 2019-01-10: qty 1

## 2019-01-10 MED ORDER — EPOETIN ALFA 20000 UNIT/ML IJ SOLN
INTRAMUSCULAR | Status: AC
  Filled 2019-01-10: qty 1

## 2019-01-10 NOTE — Progress Notes (Signed)
Pt here today for procrit and sandostatin injections. Pt given procrit in right arm. Pt tolerated injection well with no complaints. Pt given sandostatin in left hip. Pt tolerated injection well with no complaints. Pt stable and discharged home ambulatory. Pt to return as scheduled for next injections.

## 2019-01-13 ENCOUNTER — Other Ambulatory Visit (HOSPITAL_COMMUNITY): Payer: Self-pay | Admitting: Hematology

## 2019-01-13 DIAGNOSIS — C7A00 Malignant carcinoid tumor of unspecified site: Secondary | ICD-10-CM

## 2019-01-24 ENCOUNTER — Inpatient Hospital Stay (HOSPITAL_COMMUNITY): Payer: Medicare Other

## 2019-01-24 ENCOUNTER — Other Ambulatory Visit: Payer: Self-pay

## 2019-01-24 ENCOUNTER — Inpatient Hospital Stay (HOSPITAL_BASED_OUTPATIENT_CLINIC_OR_DEPARTMENT_OTHER): Payer: Medicare Other | Admitting: Hematology

## 2019-01-24 ENCOUNTER — Inpatient Hospital Stay (HOSPITAL_COMMUNITY): Payer: Medicare Other | Attending: Hematology

## 2019-01-24 VITALS — BP 111/60 | HR 62 | Temp 97.5°F | Resp 18 | Wt 193.0 lb

## 2019-01-24 DIAGNOSIS — C7B02 Secondary carcinoid tumors of liver: Secondary | ICD-10-CM | POA: Diagnosis not present

## 2019-01-24 DIAGNOSIS — C7A00 Malignant carcinoid tumor of unspecified site: Secondary | ICD-10-CM | POA: Diagnosis not present

## 2019-01-24 DIAGNOSIS — N189 Chronic kidney disease, unspecified: Secondary | ICD-10-CM | POA: Insufficient documentation

## 2019-01-24 DIAGNOSIS — D631 Anemia in chronic kidney disease: Secondary | ICD-10-CM | POA: Diagnosis not present

## 2019-01-24 DIAGNOSIS — D509 Iron deficiency anemia, unspecified: Secondary | ICD-10-CM | POA: Diagnosis not present

## 2019-01-24 LAB — CBC
HCT: 33.3 % — ABNORMAL LOW (ref 39.0–52.0)
Hemoglobin: 10.5 g/dL — ABNORMAL LOW (ref 13.0–17.0)
MCH: 26.2 pg (ref 26.0–34.0)
MCHC: 31.5 g/dL (ref 30.0–36.0)
MCV: 83 fL (ref 80.0–100.0)
Platelets: 122 10*3/uL — ABNORMAL LOW (ref 150–400)
RBC: 4.01 MIL/uL — ABNORMAL LOW (ref 4.22–5.81)
RDW: 14.7 % (ref 11.5–15.5)
WBC: 4.3 10*3/uL (ref 4.0–10.5)
nRBC: 0 % (ref 0.0–0.2)

## 2019-01-24 MED ORDER — EPOETIN ALFA 20000 UNIT/ML IJ SOLN
20000.0000 [IU] | INTRAMUSCULAR | Status: DC
  Administered 2019-01-24: 20000 [IU] via SUBCUTANEOUS
  Filled 2019-01-24: qty 1

## 2019-01-24 NOTE — Assessment & Plan Note (Signed)
1.  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  Initially managed at [place] [facility].  Transferred care to [facility] in October 2017. - Gallium-68 scan on 10/23/2017 shows metastatic disease in the liver, central right mesenteric mass and multiple peritoneal deposits. -Gallium-68 dotatate PET scan on 04/22/2018 showed multiple foci of intense radiotracer activity consistent with metastatic well-differentiated carcinoid tumor.  Metastatic sites include central mesentery, multiple peritoneal implants, liver metastasis.  Several metastatic lesions have measured higher activity.  Additional mild increase in size of central mesenteric mass.  Lesion within the right ventricle along the interventricular septum thought to be metastatic. - 2D echo on 05/03/2018 showed normal cavity size of the right ventricle with normal systolic function.  -Everolimus 5 mg daily started on 05/17/2018 along with dexamethasone mouthwash. -He denies any symptoms of carcinoid syndrome including flushing, wheezing or diarrhea. -Denies any abdominal pains.  He is tolerating everolimus very well. - Gallium-68 dotatate scan on 11/27/2018 shows overall stable well-differentiated neuroendocrine tumor metastasis at multiple sites in the liver, peritoneum and potentially heart.  Stable activity of liver lesions.  Decrease in size and activity of the right central mesenteric mass.  Decrease in activity of peritoneal implants. - Chromogranin A down to 374 from 442 last month. - He will continue everolimus 5 mg daily. his time.  He will continue monthly Sandostatin injections. - RTC in 4 weeks.   2.  Normocytic anemia: -Combination anemia from CKD, iron deficiency and bone marrow suppression from everolimus. -He was started on Procrit 20,000 units every 2 weeks on 09/26/2018. - Hemoglobin remains stable with procrit 20,000 units.

## 2019-01-24 NOTE — Progress Notes (Signed)
[place], [place] [address]   CLINIC:  Medical Oncology/Hematology  PCP:  [name], MD [place] [address] [phone]   REASON FOR VISIT:  Follow-up for  Metastatic carcinoid tumor to the liver and peritoneum:  CURRENT THERAPY: Sandostatin injections    INTERVAL HISTORY:  Allen Davenport 78 y.o. male presents today for follow up. Reports overall doing well. He is on monthly Sandostatin, tolerating well.  He reports he developed diarrhea 3 to 4 days after injection this dissipates after 24 hours.  Denies any abdominal pains or change in bowel habits.  No weight loss.  Reports adequate nutrition intake.  He is here for repeat labs and office visit.  REVIEW OF SYSTEMS:  Review of Systems  All other systems reviewed and are negative.    PAST MEDICAL/SURGICAL HISTORY:  Past Medical History:  Diagnosis Date  . Anemia   . Cancer [facility])    right renal . Prostate- Radiation treatment  . CHF (congestive heart failure) ([place])   . Chronic kidney disease    stage 4  . Edema   . Heart murmur    "nothing to worry about"  . Hyperlipidemia   . Hypertension   . Malignant carcinoid tumor ([place] ) 01/03/2016   Past Surgical History:  Procedure Laterality Date  . AV FISTULA PLACEMENT Left 10/20/2014   Procedure: Left Arm ARTERIOVENOUS (AV) FISTULA CREATION;  Surgeon: Elam Dutch, MD;  Location: [facility];  Service: Vascular;  Laterality: Left;  . NEPHRECTOMY Right 2010     SOCIAL HISTORY:  Social History   Socioeconomic History  . Marital status: Married    Spouse name: Not on file  . Number of children: Not on file  . Years of education: Not on file  . Highest education level: Not on file  Occupational History  . Not on file  Social Needs  . Financial resource strain: Not on file  . Food insecurity    Worry: Not on file    Inability: Not on file  . Transportation needs    Medical: Not on file    Non-medical: Not  on file  Tobacco Use  . Smoking status: Never Smoker  . Smokeless tobacco: Never Used  Substance and Sexual Activity  . Alcohol use: No    Alcohol/week: 0.0 standard drinks  . Drug use: No  . Sexual activity: Not on file  Lifestyle  . Physical activity    Days per week: Not on file    Minutes per session: Not on file  . Stress: Not on file  Relationships  . Social Herbalist on phone: Not on file    Gets together: Not on file    Attends religious service: Not on file    Active member of club or organization: Not on file    Attends meetings of clubs or organizations: Not on file    Relationship status: Not on file  . Intimate partner violence    Fear of current or ex partner: Not on file    Emotionally abused: Not on file    Physically abused: Not on file    Forced sexual activity: Not on file  Other Topics Concern  . Not on file  Social History Narrative  . Not on file    FAMILY HISTORY:  Family History  Problem Relation Age of Onset  . Cancer Mother   . Hypertension Father   . Cancer  Sister     CURRENT MEDICATIONS:  Outpatient Encounter Medications as of 01/24/2019  Medication Sig Note  . alendronate (FOSAMAX) 70 MG tablet TAKE ONE TABLET BY MOUTH ONCE A WEEK IN THE MORNING WITH A FULL GLASS OF WATER ON AN EMPTY STOMACH. DO NOT LAY DOWN FOR 30 MINUTES. 07/19/2016: Received from: External Pharmacy  . amLODipine (NORVASC) 10 MG tablet Take 10 mg by mouth daily.    [name] atorvastatin (LIPITOR) 80 MG tablet Take 80 mg by mouth daily at 6 PM.    . bisoprolol-hydrochlorothiazide (ZIAC) 10-6.25 MG tablet Take 1 tablet by mouth daily.    . calcitRIOL (ROCALTROL) 0.25 MCG capsule Take 0.25 mcg by mouth daily.  04/26/2016: Received from: External Pharmacy  . calcium acetate (PHOSLO) 667 MG capsule Take 667 mg by mouth 3 (three) times daily with meals.    . calcium carbonate (OS-CAL - DOSED IN MG OF ELEMENTAL CALCIUM) 1250 (500 Ca) MG tablet Take 1 tablet by mouth daily  with breakfast.    . carvedilol (COREG) 3.125 MG tablet Take 3.125 mg by mouth 2 (two) times daily with a meal.    . cholecalciferol (VITAMIN D) 1000 UNITS tablet Take 1,000 Units by mouth daily.   [name] dexamethasone (DECADRON) 0.5 MG/5ML solution SWISH TEN ML BY MOUTH FOUR TIMES DAILY FOR TWO MINUTES THEN SPIT   . everolimus (AFINITOR) 5 MG tablet Take 1 tablet (5 mg total) by mouth daily.   [name] FERREX 150 150 MG capsule Take 150 mg by mouth 2 (two) times daily.   . furosemide (LASIX) 40 MG tablet Take 40 mg by mouth.   . hydrALAZINE (APRESOLINE) 25 MG tablet TAKE THREE (3) TABLETS BY MOUTH TWICE DAILY 07/19/2016: Received from: External Pharmacy  . labetalol (NORMODYNE) 300 MG tablet Take 300 mg by mouth 2 (two) times daily.   [name] levocetirizine (XYZAL) 5 MG tablet Take 5 mg by mouth every evening.    . metolazone (ZAROXOLYN) 5 MG tablet Take 5 mg by mouth every other day.   [name] octreotide (SANDOSTATIN LAR DEPOT) 30 MG injection Inject into the muscle.   . Omega-3 Fatty Acids (FISH OIL) 1000 MG CAPS Take 1 capsule by mouth daily.   . potassium chloride SA (K-DUR,KLOR-CON) 20 MEQ tablet Take 20 mEq by mouth once.    . sodium bicarbonate 650 MG tablet Take 650 mg by mouth daily.   . tamsulosin (FLOMAX) 0.4 MG CAPS capsule Take 0.4 mg by mouth daily.  04/26/2016: Received from: External Pharmacy   Facility-Administered Encounter Medications as of 01/24/2019  Medication  . epoetin alfa (EPOGEN) injection 20,000 Units    ALLERGIES:  No Known Allergies   PHYSICAL EXAM:  ECOG Performance status: 1  Vitals:   01/24/19 1234  BP: 111/60  Pulse: 62  Resp: 18  Temp: (!) 97.5 F (36.4 C)  SpO2: 100%   Filed Weights   01/24/19 1234  Weight: 193 lb (87.5 kg)    Physical Exam Constitutional:      Appearance: Normal appearance. He is obese.  HENT:     Head: Normocephalic.     Nose: Nose normal.     Mouth/Throat:     Mouth: Mucous membranes are moist.     Pharynx: Oropharynx is clear.  Eyes:      Extraocular Movements: Extraocular movements intact.     Conjunctiva/sclera: Conjunctivae normal.  Neck:     Musculoskeletal: Normal range of motion.  Cardiovascular:     Rate and Rhythm: Normal rate and regular  rhythm.     Pulses: Normal pulses.     Heart sounds: Normal heart sounds.  Pulmonary:     Effort: Pulmonary effort is normal.     Breath sounds: Normal breath sounds.  Abdominal:     General: Bowel sounds are normal.     Palpations: Abdomen is soft.  Musculoskeletal: Normal range of motion.  Skin:    General: Skin is warm.  Neurological:     General: No focal deficit present.     Mental Status: He is alert and oriented to person, place, and time.  Psychiatric:        Mood and Affect: Mood normal.        Behavior: Behavior normal.        Thought Content: Thought content normal.        Judgment: Judgment normal.      LABORATORY DATA:  I have reviewed the labs as listed.  CBC    Component Value Date/Time   WBC 4.3 01/24/2019 1217   RBC 4.01 (L) 01/24/2019 1217   HGB 10.5 (L) 01/24/2019 1217   HCT 33.3 (L) 01/24/2019 1217   PLT 122 (L) 01/24/2019 1217   MCV 83.0 01/24/2019 1217   MCH 26.2 01/24/2019 1217   MCHC 31.5 01/24/2019 1217   RDW 14.7 01/24/2019 1217   LYMPHSABS 0.4 (L) 01/10/2019 0857   MONOABS 0.6 01/10/2019 0857   EOSABS 0.0 01/10/2019 0857   BASOSABS 0.0 01/10/2019 0857   CMP Latest Ref Rng & Units 01/10/2019 12/27/2018 12/13/2018  Glucose 70 - 99 mg/dL 118(H) 114(H) 112(H)  BUN 8 - 23 mg/dL 50(H) 53(H) 53(H)  Creatinine 0.61 - 1.24 mg/dL 4.18(H) 4.44(H) 4.46(H)  Sodium 135 - 145 mmol/L 139 139 138  Potassium 3.5 - 5.1 mmol/L 2.8(L) 3.1(L) 2.8(L)  Chloride 98 - 111 mmol/L 99 98 98  CO2 22 - 32 mmol/L 29 30 28   Calcium 8.9 - 10.3 mg/dL 8.4(L) 8.4(L) 8.6(L)  Total Protein 6.5 - 8.1 g/dL 6.2(L) 6.1(L) 6.0(L)  Total Bilirubin 0.3 - 1.2 mg/dL 0.7 1.0 0.7  Alkaline Phos 38 - 126 U/L 69 67 63  AST 15 - 41 U/L 43(H) 50(H) 52(H)  ALT 0 - 44 U/L 21  22 23        ASSESSMENT & PLAN:   Malignant carcinoid tumor ([place]) 1.  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  Initially managed at [place] center.  Transferred care to [facility] in October 2017. - Gallium-68 scan on 10/23/2017 shows metastatic disease in the liver, central right mesenteric mass and multiple peritoneal deposits. -Gallium-68 dotatate PET scan on 04/22/2018 showed multiple foci of intense radiotracer activity consistent with metastatic well-differentiated carcinoid tumor.  Metastatic sites include central mesentery, multiple peritoneal implants, liver metastasis.  Several metastatic lesions have measured higher activity.  Additional mild increase in size of central mesenteric mass.  Lesion within the right ventricle along the interventricular septum thought to be metastatic. - 2D echo on 05/03/2018 showed normal cavity size of the right ventricle with normal systolic function.  -Everolimus 5 mg daily started on 05/17/2018 along with dexamethasone mouthwash. -He denies any symptoms of carcinoid syndrome including flushing, wheezing or diarrhea. -Denies any abdominal pains.  He is tolerating everolimus very well. - Gallium-68 dotatate scan on 11/27/2018 shows overall stable well-differentiated neuroendocrine tumor metastasis at multiple sites in the liver, peritoneum and potentially heart.  Stable activity of liver lesions.  Decrease in size and  activity of the right central mesenteric mass.  Decrease in activity of peritoneal implants. - Chromogranin A down to 374 from 442 last month. - He will continue everolimus 5 mg daily. his time.  He will continue monthly Sandostatin injections. - RTC in 4 weeks.   2.  Normocytic anemia: -Combination anemia from CKD, iron deficiency and bone marrow suppression from everolimus. -He was started on Procrit 20,000 units every 2 weeks on 09/26/2018. - Hemoglobin remains stable with  procrit 20,000 units.        Orders placed this encounter:  Orders Placed This Encounter  Procedures  . CBC with Differential  . Comprehensive metabolic panel  . [place], [place] [phone]

## 2019-01-24 NOTE — Progress Notes (Signed)
.  Allen Davenport presents today for injection per the provider's orders. Procrit administrated without incident; see MAR for injection details.  Patient tolerated procedure well and without incident.  No questions or complaints noted at this time.

## 2019-01-25 LAB — COMPREHENSIVE METABOLIC PANEL
ALT: 20 U/L (ref 0–44)
AST: 40 U/L (ref 15–41)
Albumin: 3.7 g/dL (ref 3.5–5.0)
Alkaline Phosphatase: 63 U/L (ref 38–126)
Anion gap: 10 (ref 5–15)
BUN: 53 mg/dL — ABNORMAL HIGH (ref 8–23)
CO2: 28 mmol/L (ref 22–32)
Calcium: 8.4 mg/dL — ABNORMAL LOW (ref 8.9–10.3)
Chloride: 99 mmol/L (ref 98–111)
Creatinine, Ser: 4.11 mg/dL — ABNORMAL HIGH (ref 0.61–1.24)
GFR calc Af Amer: 15 mL/min — ABNORMAL LOW (ref >60)
GFR calc non Af Amer: 13 mL/min — ABNORMAL LOW (ref >60)
Glucose, Bld: 73 mg/dL (ref 70–99)
Potassium: 3.3 mmol/L — ABNORMAL LOW (ref 3.5–5.1)
Sodium: 137 mmol/L (ref 135–145)
Total Bilirubin: 0.7 mg/dL (ref 0.3–1.2)
Total Protein: 6.6 g/dL (ref 6.5–8.1)

## 2019-01-28 LAB — CHROMOGRANIN A: Chromogranin A (ng/mL): 417.4 ng/mL — ABNORMAL HIGH (ref 0.0–101.8)

## 2019-02-07 ENCOUNTER — Inpatient Hospital Stay (HOSPITAL_COMMUNITY): Payer: Medicare Other

## 2019-02-07 ENCOUNTER — Other Ambulatory Visit: Payer: Self-pay

## 2019-02-07 VITALS — BP 109/60 | HR 57 | Temp 97.3°F | Resp 18 | Wt 189.9 lb

## 2019-02-07 DIAGNOSIS — C7A00 Malignant carcinoid tumor of unspecified site: Secondary | ICD-10-CM

## 2019-02-07 DIAGNOSIS — C7B02 Secondary carcinoid tumors of liver: Secondary | ICD-10-CM | POA: Diagnosis not present

## 2019-02-07 DIAGNOSIS — N189 Chronic kidney disease, unspecified: Secondary | ICD-10-CM | POA: Diagnosis not present

## 2019-02-07 DIAGNOSIS — D631 Anemia in chronic kidney disease: Secondary | ICD-10-CM | POA: Diagnosis not present

## 2019-02-07 LAB — CBC
HCT: 32.8 % — ABNORMAL LOW (ref 39.0–52.0)
Hemoglobin: 10.4 g/dL — ABNORMAL LOW (ref 13.0–17.0)
MCH: 26.2 pg (ref 26.0–34.0)
MCHC: 31.7 g/dL (ref 30.0–36.0)
MCV: 82.6 fL (ref 80.0–100.0)
Platelets: 139 10*3/uL — ABNORMAL LOW (ref 150–400)
RBC: 3.97 MIL/uL — ABNORMAL LOW (ref 4.22–5.81)
RDW: 14.8 % (ref 11.5–15.5)
WBC: 4.7 10*3/uL (ref 4.0–10.5)
nRBC: 0 % (ref 0.0–0.2)

## 2019-02-07 MED ORDER — OCTREOTIDE ACETATE 30 MG IM KIT
PACK | INTRAMUSCULAR | Status: AC
  Filled 2019-02-07: qty 1

## 2019-02-07 MED ORDER — EPOETIN ALFA 20000 UNIT/ML IJ SOLN
20000.0000 [IU] | INTRAMUSCULAR | Status: DC
  Administered 2019-02-07: 20000 [IU] via SUBCUTANEOUS
  Filled 2019-02-07: qty 1

## 2019-02-07 MED ORDER — OCTREOTIDE ACETATE 30 MG IM KIT
30.0000 mg | PACK | Freq: Once | INTRAMUSCULAR | Status: AC
  Administered 2019-02-07: 30 mg via INTRAMUSCULAR

## 2019-02-07 NOTE — Progress Notes (Signed)
Allen Davenport presents today for Sandostatin and Procrit injections. Hemoglobin reviewed prior to administration. VSS. Injection tolerated without incident or complaint. See MAR for details. Patient discharged in satisfactory condition with follow up instructions.

## 2019-02-07 NOTE — Patient Instructions (Signed)
[facility]  Discharge Instructions:  Procrit and Sandostatin injections received today.  Epoetin Alfa injection What is this medicine? EPOETIN ALFA (e POE e tin AL fa) helps your body make more red blood cells. This medicine is used to treat anemia caused by chronic kidney disease, cancer chemotherapy, or HIV-therapy. It may also be used before surgery if you have anemia. This medicine may be used for other purposes; ask your health care provider or pharmacist if you have questions. COMMON BRAND NAME(S): Epogen, Procrit, Retacrit What should I tell my health care provider before I take this medicine? They need to know if you have any of these conditions:  cancer  heart disease  high blood pressure  history of blood clots  history of stroke  low levels of folate, iron, or vitamin B12 in the blood  seizures  an unusual or allergic reaction to erythropoietin, albumin, benzyl alcohol, hamster proteins, other medicines, foods, dyes, or preservatives  pregnant or trying to get pregnant  breast-feeding How should I use this medicine? This medicine is for injection into a vein or under the skin. It is usually given by a health care professional in a hospital or clinic setting. If you get this medicine at home, you will be taught how to prepare and give this medicine. Use exactly as directed. Take your medicine at regular intervals. Do not take your medicine more often than directed. It is important that you put your used needles and syringes in a special sharps container. Do not put them in a trash can. If you do not have a sharps container, call your pharmacist or healthcare provider to get one. A special MedGuide will be given to you by the pharmacist with each prescription and refill. Be sure to read this information carefully each time. Talk to your pediatrician regarding the use of this medicine in children. While this drug may be prescribed  for selected conditions, precautions do apply. Overdosage: If you think you have taken too much of this medicine contact a poison control center or emergency room at once. NOTE: This medicine is only for you. Do not share this medicine with others. What if I miss a dose? If you miss a dose, take it as soon as you can. If it is almost time for your next dose, take only that dose. Do not take double or extra doses. What may interact with this medicine? Interactions have not been studied. This list may not describe all possible interactions. Give your health care provider a list of all the medicines, herbs, non-prescription drugs, or dietary supplements you use. Also tell them if you smoke, drink alcohol, or use illegal drugs. Some items may interact with your medicine. What should I watch for while using this medicine? Your condition will be monitored carefully while you are receiving this medicine. You may need blood work done while you are taking this medicine. This medicine may cause a decrease in vitamin B6. You should make sure that you get enough vitamin B6 while you are taking this medicine. Discuss the foods you eat and the vitamins you take with your health care professional. What side effects may I notice from receiving this medicine? Side effects that you should report to your doctor or health care professional as soon as possible:  allergic reactions like skin rash, itching or hives, swelling of the face, lips, or tongue  seizures  signs and symptoms of a blood clot such as breathing problems;  changes in vision; chest pain; severe, sudden headache; pain, swelling, warmth in the leg; trouble speaking; sudden numbness or weakness of the face, arm or leg  signs and symptoms of a stroke like changes in vision; confusion; trouble speaking or understanding; severe headaches; sudden numbness or weakness of the face, arm or leg; trouble walking; dizziness; loss of balance or coordination Side  effects that usually do not require medical attention (report to your doctor or health care professional if they continue or are bothersome):  chills  cough  dizziness  fever  headaches  joint pain  muscle cramps  muscle pain  nausea, vomiting  pain, redness, or irritation at site where injected This list may not describe all possible side effects. Call your doctor for medical advice about side effects. You may report side effects to FDA at 1-800-FDA-1088. Where should I keep my medicine? Keep out of the reach of children. Store in a refrigerator between 2 and 8 degrees C (36 and 46 degrees F). Do not freeze or shake. Throw away any unused portion if using a single-dose vial. Multi-dose vials can be kept in the refrigerator for up to 21 days after the initial dose. Throw away unused medicine. NOTE: This sheet is a summary. It may not cover all possible information. If you have questions about this medicine, talk to your doctor, pharmacist, or health care provider.  2020 Elsevier/Gold Standard (2017-02-09 08:35:19)   Octreotide injection solution What is this medicine? OCTREOTIDE (ok TREE oh tide) is used to reduce blood levels of growth hormone in patients with a condition called acromegaly. This medicine also reduces flushing and watery diarrhea caused by certain types of cancer. This medicine may be used for other purposes; ask your health care provider or pharmacist if you have questions. COMMON BRAND NAME(S): Bynfezia, Sandostatin, Sandostatin LAR What should I tell my health care provider before I take this medicine? They need to know if you have any of these conditions:  diabetes  gallbladder disease  kidney disease  liver disease  an unusual or allergic reaction to octreotide, other medicines, foods, dyes, or preservatives  pregnant or trying to get pregnant  breast-feeding How should I use this medicine? This medicine is for injection under the skin or into  a vein (only in emergency situations). It is usually given by a health care professional in a hospital or clinic setting. If you get this medicine at home, you will be taught how to prepare and give this medicine. Allow the injection solution to come to room temperature before use. Do not warm it artificially. Use exactly as directed. Take your medicine at regular intervals. Do not take your medicine more often than directed. It is important that you put your used needles and syringes in a special sharps container. Do not put them in a trash can. If you do not have a sharps container, call your pharmacist or healthcare provider to get one. Talk to your pediatrician regarding the use of this medicine in children. Special care may be needed. Overdosage: If you think you have taken too much of this medicine contact a poison control center or emergency room at once. NOTE: This medicine is only for you. Do not share this medicine with others. What if I miss a dose? If you miss a dose, take it as soon as you can. If it is almost time for your next dose, take only that dose. Do not take double or extra doses. What may interact with this medicine?  Do not take this medicine with any of the following medications:  cisapride  droperidol  general anesthetics  grepafloxacin  perphenazine  thioridazine This medicine may also interact with the following medications:  bromocriptine  cyclosporine  diuretics  medicines for blood pressure, heart disease, irregular heart beat  medicines for diabetes, including insulin  quinidine This list may not describe all possible interactions. Give your health care provider a list of all the medicines, herbs, non-prescription drugs, or dietary supplements you use. Also tell them if you smoke, drink alcohol, or use illegal drugs. Some items may interact with your medicine. What should I watch for while using this medicine? Visit your doctor or health care  professional for regular checks on your progress. To help reduce irritation at the injection site, use a different site for each injection and make sure the solution is at room temperature before use. This medicine may cause decreases in blood sugar. Signs of low blood sugar include chills, cool, pale skin or cold sweats, drowsiness, extreme hunger, fast heartbeat, headache, nausea, nervousness or anxiety, shakiness, trembling, unsteadiness, tiredness, or weakness. Contact your doctor or health care professional right away if you experience any of these symptoms. This medicine may increase blood sugar. Ask your healthcare provider if changes in diet or medicines are needed if you have diabetes. This medicine may cause a decrease in vitamin B12. You should make sure that you get enough vitamin B12 while you are taking this medicine. Discuss the foods you eat and the vitamins you take with your health care professional. What side effects may I notice from receiving this medicine? Side effects that you should report to your doctor or health care professional as soon as possible:  allergic reactions like skin rash, itching or hives, swelling of the face, lips, or tongue  decreases in blood sugar  changes in heart rate  severe stomach pain   signs and symptoms of high blood sugar such as being more thirsty or hungry or having to urinate more than normal. You may also feel very tired or have blurry vision. Side effects that usually do not require medical attention (report to your doctor or health care professional if they continue or are bothersome):  diarrhea or constipation  gas or stomach pain  nausea, vomiting  pain, redness, swelling and irritation at site where injected This list may not describe all possible side effects. Call your doctor for medical advice about side effects. You may report side effects to FDA at 1-800-FDA-1088. Where should I keep my medicine? Keep out of the reach of  children. Store in a refrigerator between 2 and 8 degrees C (36 and 46 degrees F). Protect from light. Allow to come to room temperature naturally. Do not use artificial heat. If protected from light, the injection may be stored at room temperature between 20 and 30 degrees C (70 and 86 degrees F) for 14 days. After the initial use, throw away any unused portion of a multiple dose vial after 14 days. Throw away unused portions of the ampules after use. NOTE: This sheet is a summary. It may not cover all possible information. If you have questions about this medicine, talk to your doctor, pharmacist, or health care provider.  2020 Elsevier/Gold Standard (2018-04-11 08:07:09)   _______________________________________________________________  Thank you for choosing [place] [facility] to provide your oncology and hematology care.  To afford each patient quality time with our providers, please arrive at least 15 minutes before  your scheduled appointment.  You need to re-schedule your appointment if you arrive 10 or more minutes late.  We strive to give you quality time with our providers, and arriving late affects you and other patients whose appointments are after yours.  Also, if you no show three or more times for appointments you may be dismissed from the clinic.  Again, thank you for choosing [facility] [place] [facility] [place] hope is that these requests will allow you access to exceptional care and in a timely manner. _______________________________________________________________  If you have questions after your visit, please contact our office at [phone] between the hours of 8:30 a.m. and 5:00 p.m. Voicemails left after 4:30 p.m. will not be returned until the following business day. _______________________________________________________________  For prescription refill requests, have your pharmacy contact our  office. _______________________________________________________________  Recommendations made by the consultant and any test results will be sent to your referring physician. _______________________________________________________________

## 2019-02-21 ENCOUNTER — Inpatient Hospital Stay (HOSPITAL_COMMUNITY): Payer: Medicare Other

## 2019-02-21 ENCOUNTER — Other Ambulatory Visit: Payer: Self-pay

## 2019-02-21 ENCOUNTER — Inpatient Hospital Stay (HOSPITAL_COMMUNITY): Payer: Medicare Other | Attending: Hematology

## 2019-02-21 VITALS — BP 113/62 | HR 62 | Temp 97.5°F | Resp 18 | Wt 196.2 lb

## 2019-02-21 DIAGNOSIS — C7B02 Secondary carcinoid tumors of liver: Secondary | ICD-10-CM | POA: Diagnosis not present

## 2019-02-21 DIAGNOSIS — C7A00 Malignant carcinoid tumor of unspecified site: Secondary | ICD-10-CM | POA: Insufficient documentation

## 2019-02-21 DIAGNOSIS — N189 Chronic kidney disease, unspecified: Secondary | ICD-10-CM | POA: Diagnosis not present

## 2019-02-21 DIAGNOSIS — D631 Anemia in chronic kidney disease: Secondary | ICD-10-CM | POA: Insufficient documentation

## 2019-02-21 LAB — CBC
HCT: 33.3 % — ABNORMAL LOW (ref 39.0–52.0)
Hemoglobin: 10.5 g/dL — ABNORMAL LOW (ref 13.0–17.0)
MCH: 25.9 pg — ABNORMAL LOW (ref 26.0–34.0)
MCHC: 31.5 g/dL (ref 30.0–36.0)
MCV: 82 fL (ref 80.0–100.0)
Platelets: 115 10*3/uL — ABNORMAL LOW (ref 150–400)
RBC: 4.06 MIL/uL — ABNORMAL LOW (ref 4.22–5.81)
RDW: 15 % (ref 11.5–15.5)
WBC: 4.9 10*3/uL (ref 4.0–10.5)
nRBC: 0 % (ref 0.0–0.2)

## 2019-02-21 MED ORDER — EPOETIN ALFA 20000 UNIT/ML IJ SOLN
20000.0000 [IU] | INTRAMUSCULAR | Status: DC
  Administered 2019-02-21: 20000 [IU] via SUBCUTANEOUS

## 2019-02-21 NOTE — Progress Notes (Signed)
Kolston A Bellew presents today for injection per MD orders. Procrit 20,000 administered SQ in right Upper Arm. Administration without incident. Patient tolerated well.

## 2019-02-21 NOTE — Patient Instructions (Signed)
[facility]  Discharge Instructions:   _______________________________________________________________  Thank you for choosing [facility] at [facility] to provide your oncology and hematology care.  To afford each patient quality time with our providers, please arrive at least 15 minutes before your scheduled appointment.  You need to re-schedule your appointment if you arrive 10 or more minutes late.  We strive to give you quality time with our providers, and arriving late affects you and other patients whose appointments are after yours.  Also, if you no show three or more times for appointments you may be dismissed from the clinic.  Again, thank you for choosing [facility]. Our hope is that these requests will allow you access to exceptional care and in a timely manner. _______________________________________________________________  If you have questions after your visit, please contact our office at [phone] between the hours of 8:30 a.m. and 5:00 p.m. Voicemails left after 4:30 p.m. will not be returned until the following business day. _______________________________________________________________  For prescription refill requests, have your pharmacy contact our office. _______________________________________________________________  Recommendations made by the consultant and any test results will be sent to your referring physician. _______________________________________________________________ 

## 2019-02-24 DIAGNOSIS — I10 Essential (primary) hypertension: Secondary | ICD-10-CM | POA: Diagnosis not present

## 2019-02-24 DIAGNOSIS — Z0000 Encounter for general adult medical examination without abnormal findings: Secondary | ICD-10-CM | POA: Diagnosis not present

## 2019-02-24 DIAGNOSIS — N185 Chronic kidney disease, stage 5: Secondary | ICD-10-CM | POA: Diagnosis not present

## 2019-02-24 DIAGNOSIS — Z1389 Encounter for screening for other disorder: Secondary | ICD-10-CM | POA: Diagnosis not present

## 2019-02-24 DIAGNOSIS — M818 Other osteoporosis without current pathological fracture: Secondary | ICD-10-CM | POA: Diagnosis not present

## 2019-02-24 DIAGNOSIS — E785 Hyperlipidemia, unspecified: Secondary | ICD-10-CM | POA: Diagnosis not present

## 2019-02-26 DIAGNOSIS — E1151 Type 2 diabetes mellitus with diabetic peripheral angiopathy without gangrene: Secondary | ICD-10-CM | POA: Diagnosis not present

## 2019-02-26 DIAGNOSIS — E1140 Type 2 diabetes mellitus with diabetic neuropathy, unspecified: Secondary | ICD-10-CM | POA: Diagnosis not present

## 2019-03-07 ENCOUNTER — Inpatient Hospital Stay (HOSPITAL_BASED_OUTPATIENT_CLINIC_OR_DEPARTMENT_OTHER): Payer: Medicare Other | Admitting: Hematology

## 2019-03-07 ENCOUNTER — Inpatient Hospital Stay (HOSPITAL_COMMUNITY): Payer: Medicare Other

## 2019-03-07 ENCOUNTER — Other Ambulatory Visit: Payer: Self-pay

## 2019-03-07 VITALS — BP 118/70 | HR 63 | Temp 97.3°F | Resp 16 | Wt 188.2 lb

## 2019-03-07 DIAGNOSIS — D631 Anemia in chronic kidney disease: Secondary | ICD-10-CM | POA: Diagnosis not present

## 2019-03-07 DIAGNOSIS — C7A00 Malignant carcinoid tumor of unspecified site: Secondary | ICD-10-CM

## 2019-03-07 DIAGNOSIS — N189 Chronic kidney disease, unspecified: Secondary | ICD-10-CM | POA: Diagnosis not present

## 2019-03-07 DIAGNOSIS — C179 Malignant neoplasm of small intestine, unspecified: Secondary | ICD-10-CM

## 2019-03-07 DIAGNOSIS — C7B02 Secondary carcinoid tumors of liver: Secondary | ICD-10-CM | POA: Diagnosis not present

## 2019-03-07 LAB — CBC
HCT: 33.5 % — ABNORMAL LOW (ref 39.0–52.0)
Hemoglobin: 10.8 g/dL — ABNORMAL LOW (ref 13.0–17.0)
MCH: 26.1 pg (ref 26.0–34.0)
MCHC: 32.2 g/dL (ref 30.0–36.0)
MCV: 80.9 fL (ref 80.0–100.0)
Platelets: 133 10*3/uL — ABNORMAL LOW (ref 150–400)
RBC: 4.14 MIL/uL — ABNORMAL LOW (ref 4.22–5.81)
RDW: 14.8 % (ref 11.5–15.5)
WBC: 4.8 10*3/uL (ref 4.0–10.5)
nRBC: 0 % (ref 0.0–0.2)

## 2019-03-07 MED ORDER — OCTREOTIDE ACETATE 30 MG IM KIT
PACK | INTRAMUSCULAR | Status: AC
  Filled 2019-03-07: qty 1

## 2019-03-07 MED ORDER — OCTREOTIDE ACETATE 30 MG IM KIT
30.0000 mg | PACK | Freq: Once | INTRAMUSCULAR | Status: AC
  Administered 2019-03-07: 30 mg via INTRAMUSCULAR
  Filled 2019-03-07: qty 1

## 2019-03-07 MED ORDER — EPOETIN ALFA 40000 UNIT/ML IJ SOLN
20000.0000 [IU] | INTRAMUSCULAR | Status: DC
  Administered 2019-03-07: 20000 [IU] via SUBCUTANEOUS
  Filled 2019-03-07: qty 1

## 2019-03-07 NOTE — Progress Notes (Signed)
Sandostatin and procrit given per orders. See MAR for details.  Patient tolerated it well without problems. Vitals stable and discharged home from clinic ambulatory. Follow up as scheduled.

## 2019-03-07 NOTE — Assessment & Plan Note (Signed)
1.  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  Initially managed at [place].  Transferred care to [facility] in October 2017. - Gallium-68 scan on 10/23/2017 shows metastatic disease in the liver, central right mesenteric mass and multiple peritoneal deposits. -Gallium-68 dotatate PET scan on 04/22/2018 showed multiple foci of intense radiotracer activity consistent with metastatic well-differentiated carcinoid tumor.  Metastatic sites include central mesentery, multiple peritoneal implants, liver metastasis.  Several metastatic lesions have measured higher activity.  Additional mild increase in size of central mesenteric mass.  Lesion within the right ventricle along the interventricular septum thought to be metastatic. - 2D echo on 05/03/2018 showed normal cavity size of the right ventricle with normal systolic function.  -Everolimus 5 mg daily started on 05/17/2018 along with dexamethasone mouthwash. -He denies any symptoms of carcinoid syndrome including flushing, wheezing or diarrhea. -Denies any abdominal pains.  He is tolerating everolimus very well. - Gallium-68 dotatate scan on 11/27/2018 shows overall stable well-differentiated neuroendocrine tumor metastasis at multiple sites in the liver, peritoneum and potentially heart.  Stable activity of liver lesions.  Decrease in size and activity of the right central mesenteric mass.  Decrease in activity of peritoneal implants. - Chromogranin A remains elevated but stable at 417.4. - He will continue everolimus 5 mg daily. his time.  He will continue monthly Sandostatin injections. - Plan to repeat Gallium-68 Dotatate PET before his next visit.  - RTC in 4 weeks.   2.  Normocytic anemia: -Combination anemia from CKD, iron deficiency and bone marrow suppression from everolimus. -He was started on Procrit 20,000 units every 2 weeks on 09/26/2018. - Hemoglobin remains stable with  procrit 20,000 units.

## 2019-03-07 NOTE — Progress Notes (Signed)
[place], [place] [address]   CLINIC:  Medical Oncology/Hematology  PCP:  [name], MD [place] [phone]   REASON FOR VISIT:  Follow-up for Carcinoid tumor  CURRENT THERAPY: Sandostatin and everolimus    INTERVAL HISTORY:  Allen Davenport 78 y.o. male presents today for follow-up.  He reports overall doing well.  He denies any significant fatigue.  He reports he has 1 day of diarrhea usually once a month.  He denies any abdominal pains.  He is currently tolerating current treatment well.  He denies any changes to his health history since his last visit.  His appetite is stable.  He remains physically active.  He is here for repeat labs, treatment, and office visit.   REVIEW OF SYSTEMS:  Review of Systems  All other systems reviewed and are negative.    PAST MEDICAL/SURGICAL HISTORY:  Past Medical History:  Diagnosis Date  . Anemia   . Cancer [facility])    right renal . Prostate- Radiation treatment  . CHF (congestive heart failure) ([place])   . Chronic kidney disease    stage 4  . Edema   . Heart murmur    "nothing to worry about"  . Hyperlipidemia   . Hypertension   . Malignant carcinoid tumor ([place]) 01/03/2016   Past Surgical History:  Procedure Laterality Date  . AV FISTULA PLACEMENT Left 10/20/2014   Procedure: Left Arm ARTERIOVENOUS (AV) FISTULA CREATION;  Surgeon: Elam Dutch, MD;  Location: [facility];  Service: Vascular;  Laterality: Left;  . NEPHRECTOMY Right 2010     SOCIAL HISTORY:  Social History   Socioeconomic History  . Marital status: Married    Spouse name: Not on file  . Number of children: Not on file  . Years of education: Not on file  . Highest education level: Not on file  Occupational History  . Not on file  Social Needs  . Financial resource strain: Not on file  . Food insecurity    Worry: Not on file    Inability: Not on file  . Transportation needs   Medical: Not on file    Non-medical: Not on file  Tobacco Use  . Smoking status: Never Smoker  . Smokeless tobacco: Never Used  Substance and Sexual Activity  . Alcohol use: No    Alcohol/week: 0.0 standard drinks  . Drug use: No  . Sexual activity: Not on file  Lifestyle  . Physical activity    Days per week: Not on file    Minutes per session: Not on file  . Stress: Not on file  Relationships  . Social Herbalist on phone: Not on file    Gets together: Not on file    Attends religious service: Not on file    Active member of club or organization: Not on file    Attends meetings of clubs or organizations: Not on file    Relationship status: Not on file  . Intimate partner violence    Fear of current or ex partner: Not on file    Emotionally abused: Not on file    Physically abused: Not on file    Forced sexual activity: Not on file  Other Topics Concern  . Not on file  Social History Narrative  . Not on file    FAMILY HISTORY:  Family History  Problem Relation Age of Onset  . Cancer Mother   .  Hypertension Father   . Cancer Sister     CURRENT MEDICATIONS:  Outpatient Encounter Medications as of 03/07/2019  Medication Sig Note  . amLODipine (NORVASC) 10 MG tablet Take 10 mg by mouth daily.    [name] atorvastatin (LIPITOR) 80 MG tablet Take 80 mg by mouth daily at 6 PM.    . calcitRIOL (ROCALTROL) 0.25 MCG capsule Take 0.25 mcg by mouth daily.  04/26/2016: Received from: External Pharmacy  . carvedilol (COREG) 3.125 MG tablet Take 3.125 mg by mouth 2 (two) times daily with a meal.    . dexamethasone (DECADRON) 0.5 MG/5ML solution SWISH TEN ML BY MOUTH FOUR TIMES DAILY FOR TWO MINUTES THEN SPIT   . everolimus (AFINITOR) 5 MG tablet Take 1 tablet (5 mg total) by mouth daily.   [name] FERREX 150 150 MG capsule Take 150 mg by mouth 2 (two) times daily.   . furosemide (LASIX) 40 MG tablet Take 40 mg by mouth.   . hydrALAZINE (APRESOLINE) 25 MG tablet TAKE THREE (3)  TABLETS BY MOUTH TWICE DAILY 07/19/2016: Received from: External Pharmacy  . octreotide (SANDOSTATIN LAR DEPOT) 30 MG injection Inject into the muscle.   . Omega-3 Fatty Acids (FISH OIL) 1000 MG CAPS Take 1 capsule by mouth daily.   . sodium bicarbonate 650 MG tablet Take 650 mg by mouth daily.   . tamsulosin (FLOMAX) 0.4 MG CAPS capsule Take 0.4 mg by mouth daily.  04/26/2016: Received from: External Pharmacy  . metolazone (ZAROXOLYN) 5 MG tablet Take 5 mg by mouth every other day.   . [DISCONTINUED] alendronate (FOSAMAX) 70 MG tablet TAKE ONE TABLET BY MOUTH ONCE A WEEK IN THE MORNING WITH A FULL GLASS OF WATER ON AN EMPTY STOMACH. DO NOT LAY DOWN FOR 30 MINUTES. 07/19/2016: Received from: External Pharmacy  . [DISCONTINUED] bisoprolol-hydrochlorothiazide (ZIAC) 10-6.25 MG tablet Take 1 tablet by mouth daily.    . [DISCONTINUED] calcium acetate (PHOSLO) 667 MG capsule Take 667 mg by mouth 3 (three) times daily with meals.    . [DISCONTINUED] calcium carbonate (OS-CAL - DOSED IN MG OF ELEMENTAL CALCIUM) 1250 (500 Ca) MG tablet Take 1 tablet by mouth daily with breakfast.    . [DISCONTINUED] cholecalciferol (VITAMIN D) 1000 UNITS tablet Take 1,000 Units by mouth daily.   . [DISCONTINUED] labetalol (NORMODYNE) 300 MG tablet Take 300 mg by mouth 2 (two) times daily.   . [DISCONTINUED] levocetirizine (XYZAL) 5 MG tablet Take 5 mg by mouth every evening.    . [DISCONTINUED] potassium chloride SA (K-DUR,KLOR-CON) 20 MEQ tablet Take 20 mEq by mouth once.     No facility-administered encounter medications on file as of 03/07/2019.     ALLERGIES:  No Known Allergies   PHYSICAL EXAM:  ECOG Performance status: 1  Vitals:   03/07/19 1208  BP: 118/70  Pulse: 63  Resp: 16  Temp: (!) 97.3 F (36.3 C)  SpO2: 97%   Filed Weights   03/07/19 1208  Weight: 188 lb 3.2 oz (85.4 kg)    Physical Exam Constitutional:      Appearance: Normal appearance.  HENT:     Head: Normocephalic.     Right Ear:  External ear normal.     Left Ear: External ear normal.     Nose: Nose normal.     Mouth/Throat:     Mouth: Mucous membranes are moist.     Pharynx: Oropharynx is clear.  Eyes:     Extraocular Movements: Extraocular movements intact.     Conjunctiva/sclera: Conjunctivae  normal.  Neck:     Musculoskeletal: Normal range of motion.  Cardiovascular:     Rate and Rhythm: Normal rate and regular rhythm.     Pulses: Normal pulses.     Heart sounds: Normal heart sounds.  Pulmonary:     Effort: Pulmonary effort is normal.     Breath sounds: Normal breath sounds.  Abdominal:     General: Bowel sounds are normal.  Musculoskeletal: Normal range of motion.  Skin:    General: Skin is warm and dry.  Neurological:     General: No focal deficit present.     Mental Status: He is alert and oriented to person, place, and time. Mental status is at baseline.  Psychiatric:        Mood and Affect: Mood normal.        Behavior: Behavior normal.        Thought Content: Thought content normal.        Judgment: Judgment normal.      LABORATORY DATA:  I have reviewed the labs as listed.  CBC    Component Value Date/Time   WBC 4.8 03/07/2019 1147   RBC 4.14 (L) 03/07/2019 1147   HGB 10.8 (L) 03/07/2019 1147   HCT 33.5 (L) 03/07/2019 1147   PLT 133 (L) 03/07/2019 1147   MCV 80.9 03/07/2019 1147   MCH 26.1 03/07/2019 1147   MCHC 32.2 03/07/2019 1147   RDW 14.8 03/07/2019 1147   LYMPHSABS 0.4 (L) 01/10/2019 0857   MONOABS 0.6 01/10/2019 0857   EOSABS 0.0 01/10/2019 0857   BASOSABS 0.0 01/10/2019 0857   CMP Latest Ref Rng & Units 01/24/2019 01/10/2019 12/27/2018  Glucose 70 - 99 mg/dL 73 118(H) 114(H)  BUN 8 - 23 mg/dL 53(H) 50(H) 53(H)  Creatinine 0.61 - 1.24 mg/dL 4.11(H) 4.18(H) 4.44(H)  Sodium 135 - 145 mmol/L 137 139 139  Potassium 3.5 - 5.1 mmol/L 3.3(L) 2.8(L) 3.1(L)  Chloride 98 - 111 mmol/L 99 99 98  CO2 22 - 32 mmol/L 28 29 30   Calcium 8.9 - 10.3 mg/dL 8.4(L) 8.4(L) 8.4(L)  Total  Protein 6.5 - 8.1 g/dL 6.6 6.2(L) 6.1(L)  Total Bilirubin 0.3 - 1.2 mg/dL 0.7 0.7 1.0  Alkaline Phos 38 - 126 U/L 63 69 67  AST 15 - 41 U/L 40 43(H) 50(H)  ALT 0 - 44 U/L 20 21 22            ASSESSMENT & PLAN:   Small bowel cancer (HCC) 1.  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  Initially managed at [facility].  Transferred care to [facility] in October 2017. - Gallium-68 scan on 10/23/2017 shows metastatic disease in the liver, central right mesenteric mass and multiple peritoneal deposits. -Gallium-68 dotatate PET scan on 04/22/2018 showed multiple foci of intense radiotracer activity consistent with metastatic well-differentiated carcinoid tumor.  Metastatic sites include central mesentery, multiple peritoneal implants, liver metastasis.  Several metastatic lesions have measured higher activity.  Additional mild increase in size of central mesenteric mass.  Lesion within the right ventricle along the interventricular septum thought to be metastatic. - 2D echo on 05/03/2018 showed normal cavity size of the right ventricle with normal systolic function.  -Everolimus 5 mg daily started on 05/17/2018 along with dexamethasone mouthwash. -He denies any symptoms of carcinoid syndrome including flushing, wheezing or diarrhea. -Denies any abdominal pains.  He is tolerating everolimus very well. - Gallium-68 dotatate scan on 11/27/2018  shows overall stable well-differentiated neuroendocrine tumor metastasis at multiple sites in the liver, peritoneum and potentially heart.  Stable activity of liver lesions.  Decrease in size and activity of the right central mesenteric mass.  Decrease in activity of peritoneal implants. - Chromogranin A remains elevated but stable at 417.4. - He will continue everolimus 5 mg daily. his time.  He will continue monthly Sandostatin injections. - Plan to repeat Gallium-68 Dotatate PET before his next  visit.  - RTC in 4 weeks.   2.  Normocytic anemia: -Combination anemia from CKD, iron deficiency and bone marrow suppression from everolimus. -He was started on Procrit 20,000 units every 2 weeks on 09/26/2018. - Hemoglobin remains stable with procrit 20,000 units.        Orders placed this encounter:  Orders Placed This Encounter  Procedures  . NM PET (NETSPOT GA 49 DOTATATE) SKULL BASE TO MID THIGH  . Chromogranin A  . CBC with Differential  . Comprehensive metabolic panel      Roger Shelter, [place] [phone]

## 2019-03-11 DIAGNOSIS — E785 Hyperlipidemia, unspecified: Secondary | ICD-10-CM | POA: Diagnosis not present

## 2019-03-11 DIAGNOSIS — N185 Chronic kidney disease, stage 5: Secondary | ICD-10-CM | POA: Diagnosis not present

## 2019-03-11 DIAGNOSIS — M818 Other osteoporosis without current pathological fracture: Secondary | ICD-10-CM | POA: Diagnosis not present

## 2019-03-11 DIAGNOSIS — I10 Essential (primary) hypertension: Secondary | ICD-10-CM | POA: Diagnosis not present

## 2019-03-25 ENCOUNTER — Other Ambulatory Visit: Payer: Self-pay

## 2019-03-25 ENCOUNTER — Inpatient Hospital Stay (HOSPITAL_COMMUNITY): Payer: Medicare Other | Attending: Hematology

## 2019-03-25 ENCOUNTER — Inpatient Hospital Stay (HOSPITAL_COMMUNITY): Payer: Medicare Other

## 2019-03-25 VITALS — BP 114/63 | HR 60 | Temp 97.9°F | Resp 18

## 2019-03-25 DIAGNOSIS — D631 Anemia in chronic kidney disease: Secondary | ICD-10-CM | POA: Diagnosis not present

## 2019-03-25 DIAGNOSIS — C7B02 Secondary carcinoid tumors of liver: Secondary | ICD-10-CM | POA: Insufficient documentation

## 2019-03-25 DIAGNOSIS — N189 Chronic kidney disease, unspecified: Secondary | ICD-10-CM | POA: Insufficient documentation

## 2019-03-25 DIAGNOSIS — C7A00 Malignant carcinoid tumor of unspecified site: Secondary | ICD-10-CM | POA: Diagnosis not present

## 2019-03-25 DIAGNOSIS — N184 Chronic kidney disease, stage 4 (severe): Secondary | ICD-10-CM

## 2019-03-25 LAB — CBC
HCT: 31.5 % — ABNORMAL LOW (ref 39.0–52.0)
Hemoglobin: 10.2 g/dL — ABNORMAL LOW (ref 13.0–17.0)
MCH: 26.8 pg (ref 26.0–34.0)
MCHC: 32.4 g/dL (ref 30.0–36.0)
MCV: 82.7 fL (ref 80.0–100.0)
Platelets: 124 10*3/uL — ABNORMAL LOW (ref 150–400)
RBC: 3.81 MIL/uL — ABNORMAL LOW (ref 4.22–5.81)
RDW: 14.5 % (ref 11.5–15.5)
WBC: 5.1 10*3/uL (ref 4.0–10.5)
nRBC: 0 % (ref 0.0–0.2)

## 2019-03-25 MED ORDER — EPOETIN ALFA-EPBX 10000 UNIT/ML IJ SOLN
20000.0000 [IU] | Freq: Once | INTRAMUSCULAR | Status: AC
  Administered 2019-03-25: 20000 [IU] via SUBCUTANEOUS

## 2019-03-25 MED ORDER — EPOETIN ALFA-EPBX 10000 UNIT/ML IJ SOLN
INTRAMUSCULAR | Status: AC
  Filled 2019-03-25: qty 2

## 2019-03-25 NOTE — Progress Notes (Signed)
Allen Davenport presents today for injection per MD orders. Retacrit administered SQ in right Upper Arm. Administration without incident. Patient tolerated well.   Vital signs stable. No complaints at this time. Discharged from clinic ambulatory. F/U with [facility] as scheduled.

## 2019-03-25 NOTE — Patient Instructions (Signed)
[facility] at [facility]  Discharge Instructions:   _______________________________________________________________  Thank you for choosing [facility] at [facility] to provide your oncology and hematology care.  To afford each patient quality time with our providers, please arrive at least 15 minutes before your scheduled appointment.  You need to re-schedule your appointment if you arrive 10 or more minutes late.  We strive to give you quality time with our providers, and arriving late affects you and other patients whose appointments are after yours.  Also, if you no show three or more times for appointments you may be dismissed from the clinic.  Again, thank you for choosing [facility] at [facility]. Our hope is that these requests will allow you access to exceptional care and in a timely manner. _______________________________________________________________  If you have questions after your visit, please contact our office at [phone] between the hours of 8:30 a.m. and 5:00 p.m. Voicemails left after 4:30 p.m. will not be returned until the following business day. _______________________________________________________________  For prescription refill requests, have your pharmacy contact our office. _______________________________________________________________  Recommendations made by the consultant and any test results will be sent to your referring physician. _______________________________________________________________ 

## 2019-04-04 ENCOUNTER — Other Ambulatory Visit (HOSPITAL_COMMUNITY): Payer: Self-pay | Admitting: *Deleted

## 2019-04-04 DIAGNOSIS — C7A019 Malignant carcinoid tumor of the small intestine, unspecified portion: Secondary | ICD-10-CM

## 2019-04-04 MED ORDER — EVEROLIMUS 5 MG PO TABS: 5.0000 mg | ORAL_TABLET | Freq: Every day | ORAL | 2 refills | Status: DC

## 2019-04-08 ENCOUNTER — Other Ambulatory Visit: Payer: Self-pay

## 2019-04-08 ENCOUNTER — Ambulatory Visit (HOSPITAL_COMMUNITY)
Admission: RE | Admit: 2019-04-08 | Discharge: 2019-04-08 | Disposition: A | Discharge status: Home / Self Care | Payer: Medicare Other | Source: Ambulatory Visit | Attending: Hematology | Admitting: Hematology

## 2019-04-08 DIAGNOSIS — C7A00 Malignant carcinoid tumor of unspecified site: Secondary | ICD-10-CM | POA: Diagnosis not present

## 2019-04-08 DIAGNOSIS — C787 Secondary malignant neoplasm of liver and intrahepatic bile duct: Secondary | ICD-10-CM | POA: Diagnosis not present

## 2019-04-08 DIAGNOSIS — C7A1 Malignant poorly differentiated neuroendocrine tumors: Secondary | ICD-10-CM | POA: Diagnosis not present

## 2019-04-08 MED ORDER — GALLIUM GA 68 DOTATATE IV KIT
5.1000 | PACK | Freq: Once | INTRAVENOUS | Status: AC
  Administered 2019-04-08: 5.1 via INTRAVENOUS

## 2019-04-09 DIAGNOSIS — I10 Essential (primary) hypertension: Secondary | ICD-10-CM | POA: Diagnosis not present

## 2019-04-09 DIAGNOSIS — E785 Hyperlipidemia, unspecified: Secondary | ICD-10-CM | POA: Diagnosis not present

## 2019-04-09 DIAGNOSIS — M818 Other osteoporosis without current pathological fracture: Secondary | ICD-10-CM | POA: Diagnosis not present

## 2019-04-09 DIAGNOSIS — N185 Chronic kidney disease, stage 5: Secondary | ICD-10-CM | POA: Diagnosis not present

## 2019-04-10 ENCOUNTER — Other Ambulatory Visit (HOSPITAL_COMMUNITY): Payer: Self-pay | Admitting: *Deleted

## 2019-04-10 DIAGNOSIS — D631 Anemia in chronic kidney disease: Secondary | ICD-10-CM

## 2019-04-10 DIAGNOSIS — D509 Iron deficiency anemia, unspecified: Secondary | ICD-10-CM

## 2019-04-11 ENCOUNTER — Other Ambulatory Visit: Payer: Self-pay

## 2019-04-11 ENCOUNTER — Inpatient Hospital Stay (HOSPITAL_BASED_OUTPATIENT_CLINIC_OR_DEPARTMENT_OTHER): Payer: Medicare Other | Admitting: Hematology

## 2019-04-11 ENCOUNTER — Inpatient Hospital Stay (HOSPITAL_COMMUNITY): Payer: Medicare Other

## 2019-04-11 VITALS — BP 118/63 | HR 75 | Temp 97.5°F | Resp 16 | Wt 191.5 lb

## 2019-04-11 DIAGNOSIS — C7B02 Secondary carcinoid tumors of liver: Secondary | ICD-10-CM | POA: Diagnosis not present

## 2019-04-11 DIAGNOSIS — C7A00 Malignant carcinoid tumor of unspecified site: Secondary | ICD-10-CM

## 2019-04-11 DIAGNOSIS — N189 Chronic kidney disease, unspecified: Secondary | ICD-10-CM | POA: Diagnosis not present

## 2019-04-11 DIAGNOSIS — D631 Anemia in chronic kidney disease: Secondary | ICD-10-CM | POA: Diagnosis not present

## 2019-04-11 LAB — HEMOGLOBIN: Hemoglobin: 10 g/dL — ABNORMAL LOW (ref 13.0–17.0)

## 2019-04-11 MED ORDER — OCTREOTIDE ACETATE 30 MG IM KIT
30.0000 mg | PACK | Freq: Once | INTRAMUSCULAR | Status: AC
  Administered 2019-04-11: 30 mg via INTRAMUSCULAR

## 2019-04-11 MED ORDER — EPOETIN ALFA-EPBX 10000 UNIT/ML IJ SOLN
20000.0000 [IU] | Freq: Once | INTRAMUSCULAR | Status: AC
  Administered 2019-04-11: 20000 [IU] via SUBCUTANEOUS

## 2019-04-11 MED ORDER — EPOETIN ALFA-EPBX 10000 UNIT/ML IJ SOLN
INTRAMUSCULAR | Status: AC
  Filled 2019-04-11: qty 2

## 2019-04-11 NOTE — Progress Notes (Signed)
[place], [place] [address]   CLINIC:  Medical Oncology/Hematology  PCP:  [name], MD [place] [phone]   REASON FOR VISIT:  Follow-up for Carcinoid tumor   CURRENT THERAPY: Sandostatin    INTERVAL HISTORY:  Allen Davenport 78 y.o. male presents today for follow up. Reports over all doing well. Denies any significant fatigue. Denies any change in bowel habits. No diarrhea. No abdominal pain. No difficulty breathing. Allen Davenport continues on monthly Sandostatin, tolerating well. Allen Davenport is here to discuss most recent Dotatate scan results and injection.    REVIEW OF SYSTEMS:  Review of Systems  All other systems reviewed and are negative.    PAST MEDICAL/SURGICAL HISTORY:  Past Medical History:  Diagnosis Date  . Anemia   . Cancer [facility])    right renal . Prostate- Radiation treatment  . CHF (congestive heart failure) ([place])   . Chronic kidney disease    stage 4  . Edema   . Heart murmur    "nothing to worry about"  . Hyperlipidemia   . Hypertension   . Malignant carcinoid tumor ([place]) 01/03/2016   Past Surgical History:  Procedure Laterality Date  . AV FISTULA PLACEMENT Left 10/20/2014   Procedure: Left Arm ARTERIOVENOUS (AV) FISTULA CREATION;  Surgeon: Elam Dutch, MD;  Location: [facility];  Service: Vascular;  Laterality: Left;  . NEPHRECTOMY Right 2010     SOCIAL HISTORY:  Social History   Socioeconomic History  . Marital status: Married    Spouse name: Not on file  . Number of children: Not on file  . Years of education: Not on file  . Highest education level: Not on file  Occupational History  . Not on file  Social Needs  . Financial resource strain: Not on file  . Food insecurity    Worry: Not on file    Inability: Not on file  . Transportation needs    Medical: Not on file    Non-medical: Not on file  Tobacco Use  . Smoking status: Never Smoker  . Smokeless tobacco: Never Used   Substance and Sexual Activity  . Alcohol use: No    Alcohol/week: 0.0 standard drinks  . Drug use: No  . Sexual activity: Not on file  Lifestyle  . Physical activity    Days per week: Not on file    Minutes per session: Not on file  . Stress: Not on file  Relationships  . Social Herbalist on phone: Not on file    Gets together: Not on file    Attends religious service: Not on file    Active member of club or organization: Not on file    Attends meetings of clubs or organizations: Not on file    Relationship status: Not on file  . Intimate partner violence    Fear of current or ex partner: Not on file    Emotionally abused: Not on file    Physically abused: Not on file    Forced sexual activity: Not on file  Other Topics Concern  . Not on file  Social History Narrative  . Not on file    FAMILY HISTORY:  Family History  Problem Relation Age of Onset  . Cancer Mother   . Hypertension Father   . Cancer Sister     CURRENT MEDICATIONS:  Outpatient Encounter Medications as of 04/11/2019  Medication Sig Note  .  alendronate (FOSAMAX) 70 MG tablet TAKE ONE TABLET BY MOUTH ONCE A WEEK IN THE MORNING WITH A FULL GLASS OF WATER ON AN EMPTY STOMACH. DO NOT LAY DOWN FOR 30 MINUTES.   [name] amLODipine (NORVASC) 10 MG tablet Take 10 mg by mouth daily.    [name] atorvastatin (LIPITOR) 80 MG tablet Take 80 mg by mouth daily at 6 PM.    . calcitRIOL (ROCALTROL) 0.25 MCG capsule Take 0.25 mcg by mouth daily.  04/26/2016: Received from: External Pharmacy  . carvedilol (COREG) 3.125 MG tablet Take 3.125 mg by mouth 2 (two) times daily with a meal.    . dexamethasone (DECADRON) 0.5 MG/5ML solution SWISH TEN ML BY MOUTH FOUR TIMES DAILY FOR TWO MINUTES THEN SPIT   . everolimus (AFINITOR) 5 MG tablet Take 1 tablet (5 mg total) by mouth daily.   [name] FERREX 150 150 MG capsule Take 150 mg by mouth 2 (two) times daily.   . furosemide (LASIX) 40 MG tablet Take 40 mg by mouth.   . hydrALAZINE  (APRESOLINE) 25 MG tablet TAKE THREE (3) TABLETS BY MOUTH TWICE DAILY 07/19/2016: Received from: External Pharmacy  . metolazone (ZAROXOLYN) 5 MG tablet Take 5 mg by mouth every other day.   [name] octreotide (SANDOSTATIN LAR DEPOT) 30 MG injection Inject into the muscle.   . Omega-3 Fatty Acids (FISH OIL) 1000 MG CAPS Take 1 capsule by mouth daily.   . sodium bicarbonate 650 MG tablet Take 650 mg by mouth daily.   . tamsulosin (FLOMAX) 0.4 MG CAPS capsule Take 0.4 mg by mouth daily.  04/26/2016: Received from: External Pharmacy  . [EXPIRED] octreotide (SANDOSTATIN LAR) IM injection 30 mg     No facility-administered encounter medications on file as of 04/11/2019.     ALLERGIES:  No Known Allergies   PHYSICAL EXAM:  ECOG Performance status: 1  Vitals:   04/11/19 1112  BP: 118/63  Pulse: 75  Resp: 16  Temp: (!) 97.5 F (36.4 C)  SpO2: 98%   Filed Weights   04/11/19 1112  Weight: 191 lb 8 oz (86.9 kg)    Physical Exam Constitutional:      Appearance: Normal appearance.  HENT:     Head: Normocephalic.     Right Ear: External ear normal.     Left Ear: External ear normal.     Mouth/Throat:     Pharynx: Oropharynx is clear.  Eyes:     Conjunctiva/sclera: Conjunctivae normal.  Neck:     Musculoskeletal: Normal range of motion.  Cardiovascular:     Rate and Rhythm: Normal rate and regular rhythm.     Pulses: Normal pulses.  Pulmonary:     Effort: Pulmonary effort is normal.     Breath sounds: Normal breath sounds.  Abdominal:     General: Bowel sounds are normal.  Musculoskeletal: Normal range of motion.  Skin:    General: Skin is warm.  Neurological:     General: No focal deficit present.     Mental Status: Allen Davenport is alert and oriented to person, place, and time.  Psychiatric:        Mood and Affect: Mood normal.        Behavior: Behavior normal.        Thought Content: Thought content normal.        Judgment: Judgment normal.      LABORATORY DATA:  I have reviewed  the labs as listed.  CBC    Component Value Date/Time   WBC 5.1  03/25/2019 1332   RBC 3.81 (L) 03/25/2019 1332   HGB 10.0 (L) 04/11/2019 1053   HCT 31.5 (L) 03/25/2019 1332   PLT 124 (L) 03/25/2019 1332   MCV 82.7 03/25/2019 1332   MCH 26.8 03/25/2019 1332   MCHC 32.4 03/25/2019 1332   RDW 14.5 03/25/2019 1332   LYMPHSABS 0.4 (L) 01/10/2019 0857   MONOABS 0.6 01/10/2019 0857   EOSABS 0.0 01/10/2019 0857   BASOSABS 0.0 01/10/2019 0857   CMP Latest Ref Rng & Units 01/24/2019 01/10/2019 12/27/2018  Glucose 70 - 99 mg/dL 73 118(H) 114(H)  BUN 8 - 23 mg/dL 53(H) 50(H) 53(H)  Creatinine 0.61 - 1.24 mg/dL 4.11(H) 4.18(H) 4.44(H)  Sodium 135 - 145 mmol/L 137 139 139  Potassium 3.5 - 5.1 mmol/L 3.3(L) 2.8(L) 3.1(L)  Chloride 98 - 111 mmol/L 99 99 98  CO2 22 - 32 mmol/L 28 29 30   Calcium 8.9 - 10.3 mg/dL 8.4(L) 8.4(L) 8.4(L)  Total Protein 6.5 - 8.1 g/dL 6.6 6.2(L) 6.1(L)  Total Bilirubin 0.3 - 1.2 mg/dL 0.7 0.7 1.0  Alkaline Phos 38 - 126 U/L 63 69 67  AST 15 - 41 U/L 40 43(H) 50(H)  ALT 0 - 44 U/L 20 21 22           ASSESSMENT & PLAN:   Malignant carcinoid tumor (HCC) 1.  Metastatic carcinoid tumor to the liver and peritoneum: - Allen Davenport has been on Sandostatin for over 5 years.  Initially managed at [facility] center.  Transferred care to [facility] in October 2017. - Gallium-68 scan on 10/23/2017 shows metastatic disease in the liver, central right mesenteric mass and multiple peritoneal deposits. -Gallium-68 dotatate PET scan on 04/22/2018 showed multiple foci of intense radiotracer activity consistent with metastatic well-differentiated carcinoid tumor.  Metastatic sites include central mesentery, multiple peritoneal implants, liver metastasis.  Several metastatic lesions have measured higher activity.  Additional mild increase in size of central mesenteric mass.  Lesion within the right ventricle along the interventricular septum thought to be metastatic. - 2D echo on 05/03/2018  showed normal cavity size of the right ventricle with normal systolic function.  -Everolimus 5 mg daily started on 05/17/2018 along with dexamethasone mouthwash. -Allen Davenport denies any symptoms of carcinoid syndrome including flushing, wheezing or diarrhea. -Denies any abdominal pains.  Allen Davenport is tolerating everolimus very well. - Gallium-68 dotatate scan on 11/27/2018 shows overall stable well-differentiated neuroendocrine tumor metastasis at multiple sites in the liver, peritoneum and potentially heart.  Stable activity of liver lesions.  Decrease in size and activity of the right central mesenteric mass.  Decrease in activity of peritoneal implants. - Chromogranin A remains elevated but stable at 417.4. - Restaging gallium-68 dotatate scan on April 08, 2019 revealed no new evidence of disease progression.  Overall his exam was stable compared to prior.  There was a new nodularity in the right lung base that was non-FDG avid. -Allen Davenport will continue everolimus 5 mg daily at this time.  Allen Davenport will continue monthly Sandostatin injections. - RTC in 4 weeks.  We will repeat a chromogranin a level prior to his next visit.  2.  Normocytic anemia: -Combination anemia from CKD, iron deficiency and bone marrow suppression from everolimus. -Allen Davenport was started on Procrit 20,000 units every 2 weeks on 09/26/2018. - Hemoglobin remains stable with procrit 20,000 units.        Orders placed this encounter:  Orders Placed This Encounter  Procedures  . CBC with Differential  . Comprehensive metabolic panel  . Chromogranin A  [place] [phone]

## 2019-04-11 NOTE — Progress Notes (Signed)
Proceed with sandostatin and retacrit injection today per Reynolds Bowl NP.

## 2019-04-11 NOTE — Progress Notes (Signed)
Sandostatin and Retacrit given per orders. Patient tolerated it well without problems. Vitals stable and discharged home from clinic ambulatory. Follow up as scheduled.

## 2019-04-11 NOTE — Assessment & Plan Note (Signed)
1.  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  Initially managed at [facility] center.  Transferred care to [facility] in October 2017. - Gallium-68 scan on 10/23/2017 shows metastatic disease in the liver, central right mesenteric mass and multiple peritoneal deposits. -Gallium-68 dotatate PET scan on 04/22/2018 showed multiple foci of intense radiotracer activity consistent with metastatic well-differentiated carcinoid tumor.  Metastatic sites include central mesentery, multiple peritoneal implants, liver metastasis.  Several metastatic lesions have measured higher activity.  Additional mild increase in size of central mesenteric mass.  Lesion within the right ventricle along the interventricular septum thought to be metastatic. - 2D echo on 05/03/2018 showed normal cavity size of the right ventricle with normal systolic function.  -Everolimus 5 mg daily started on 05/17/2018 along with dexamethasone mouthwash. -He denies any symptoms of carcinoid syndrome including flushing, wheezing or diarrhea. -Denies any abdominal pains.  He is tolerating everolimus very well. - Gallium-68 dotatate scan on 11/27/2018 shows overall stable well-differentiated neuroendocrine tumor metastasis at multiple sites in the liver, peritoneum and potentially heart.  Stable activity of liver lesions.  Decrease in size and activity of the right central mesenteric mass.  Decrease in activity of peritoneal implants. - Chromogranin A remains elevated but stable at 417.4. - Restaging gallium-68 dotatate scan on April 08, 2019 revealed no new evidence of disease progression.  Overall his exam was stable compared to prior.  There was a new nodularity in the right lung base that was non-FDG avid. -He will continue everolimus 5 mg daily at this time.  He will continue monthly Sandostatin injections. - RTC in 4 weeks.  We will repeat a chromogranin a level prior to his next  visit.  2.  Normocytic anemia: -Combination anemia from CKD, iron deficiency and bone marrow suppression from everolimus. -He was started on Procrit 20,000 units every 2 weeks on 09/26/2018. - Hemoglobin remains stable with procrit 20,000 units.

## 2019-04-25 ENCOUNTER — Inpatient Hospital Stay (HOSPITAL_COMMUNITY): Payer: Medicare Other | Attending: Hematology

## 2019-04-25 ENCOUNTER — Other Ambulatory Visit (HOSPITAL_COMMUNITY)
Admission: RE | Admit: 2019-04-25 | Discharge: 2019-04-25 | Disposition: A | Discharge status: Home / Self Care | Payer: Medicare Other | Source: Ambulatory Visit | Attending: Nephrology | Admitting: Nephrology

## 2019-04-25 ENCOUNTER — Encounter (HOSPITAL_COMMUNITY): Payer: Self-pay

## 2019-04-25 ENCOUNTER — Inpatient Hospital Stay (HOSPITAL_COMMUNITY): Admission: RE | Admit: 2019-04-25 | Payer: Medicare Other | Source: Ambulatory Visit | Admitting: Nephrology

## 2019-04-25 ENCOUNTER — Inpatient Hospital Stay (HOSPITAL_COMMUNITY): Payer: Medicare Other

## 2019-04-25 ENCOUNTER — Other Ambulatory Visit: Payer: Self-pay

## 2019-04-25 VITALS — BP 107/61 | HR 60 | Temp 96.9°F | Resp 18

## 2019-04-25 DIAGNOSIS — E559 Vitamin D deficiency, unspecified: Secondary | ICD-10-CM | POA: Diagnosis not present

## 2019-04-25 DIAGNOSIS — R809 Proteinuria, unspecified: Secondary | ICD-10-CM | POA: Insufficient documentation

## 2019-04-25 DIAGNOSIS — C7A00 Malignant carcinoid tumor of unspecified site: Secondary | ICD-10-CM | POA: Insufficient documentation

## 2019-04-25 DIAGNOSIS — C7B02 Secondary carcinoid tumors of liver: Secondary | ICD-10-CM | POA: Insufficient documentation

## 2019-04-25 DIAGNOSIS — D631 Anemia in chronic kidney disease: Secondary | ICD-10-CM | POA: Insufficient documentation

## 2019-04-25 DIAGNOSIS — I10 Essential (primary) hypertension: Secondary | ICD-10-CM | POA: Insufficient documentation

## 2019-04-25 DIAGNOSIS — E876 Hypokalemia: Secondary | ICD-10-CM | POA: Insufficient documentation

## 2019-04-25 DIAGNOSIS — N185 Chronic kidney disease, stage 5: Secondary | ICD-10-CM | POA: Insufficient documentation

## 2019-04-25 DIAGNOSIS — N189 Chronic kidney disease, unspecified: Secondary | ICD-10-CM | POA: Insufficient documentation

## 2019-04-25 DIAGNOSIS — D509 Iron deficiency anemia, unspecified: Secondary | ICD-10-CM

## 2019-04-25 LAB — IRON AND TIBC
Iron: 41 ug/dL — ABNORMAL LOW (ref 45–182)
Saturation Ratios: 18 % (ref 17.9–39.5)
TIBC: 230 ug/dL — ABNORMAL LOW (ref 250–450)
UIBC: 189 ug/dL

## 2019-04-25 LAB — RENAL FUNCTION PANEL
Albumin: 3.7 g/dL (ref 3.5–5.0)
Anion gap: 13 (ref 5–15)
BUN: 85 mg/dL — ABNORMAL HIGH (ref 8–23)
CO2: 29 mmol/L (ref 22–32)
Calcium: 8 mg/dL — ABNORMAL LOW (ref 8.9–10.3)
Chloride: 94 mmol/L — ABNORMAL LOW (ref 98–111)
Creatinine, Ser: 5.03 mg/dL — ABNORMAL HIGH (ref 0.61–1.24)
GFR calc Af Amer: 12 mL/min — ABNORMAL LOW (ref >60)
GFR calc non Af Amer: 10 mL/min — ABNORMAL LOW (ref >60)
Glucose, Bld: 133 mg/dL — ABNORMAL HIGH (ref 70–99)
Phosphorus: 5 mg/dL — ABNORMAL HIGH (ref 2.5–4.6)
Potassium: 2.9 mmol/L — ABNORMAL LOW (ref 3.5–5.1)
Sodium: 136 mmol/L (ref 135–145)

## 2019-04-25 LAB — VITAMIN D 25 HYDROXY (VIT D DEFICIENCY, FRACTURES): Vit D, 25-Hydroxy: 44.29 ng/mL (ref 30–100)

## 2019-04-25 LAB — CBC
HCT: 32.4 % — ABNORMAL LOW (ref 39.0–52.0)
Hemoglobin: 10.5 g/dL — ABNORMAL LOW (ref 13.0–17.0)
MCH: 26.7 pg (ref 26.0–34.0)
MCHC: 32.4 g/dL (ref 30.0–36.0)
MCV: 82.4 fL (ref 80.0–100.0)
Platelets: 147 10*3/uL — ABNORMAL LOW (ref 150–400)
RBC: 3.93 MIL/uL — ABNORMAL LOW (ref 4.22–5.81)
RDW: 14.2 % (ref 11.5–15.5)
WBC: 5 10*3/uL (ref 4.0–10.5)
nRBC: 0 % (ref 0.0–0.2)

## 2019-04-25 LAB — HEMOGLOBIN AND HEMATOCRIT, BLOOD
HCT: 32 % — ABNORMAL LOW (ref 39.0–52.0)
Hemoglobin: 10.4 g/dL — ABNORMAL LOW (ref 13.0–17.0)

## 2019-04-25 LAB — PROTEIN / CREATININE RATIO, URINE
Creatinine, Urine: 91.71 mg/dL
Protein Creatinine Ratio: 0.85 mg/mg{Cre} — ABNORMAL HIGH (ref 0.00–0.15)
Total Protein, Urine: 78 mg/dL

## 2019-04-25 LAB — FERRITIN: Ferritin: 546 ng/mL — ABNORMAL HIGH (ref 24–336)

## 2019-04-25 MED ORDER — EPOETIN ALFA-EPBX 10000 UNIT/ML IJ SOLN
20000.0000 [IU] | Freq: Once | INTRAMUSCULAR | Status: AC
  Administered 2019-04-25: 20000 [IU] via SUBCUTANEOUS
  Filled 2019-04-25: qty 2

## 2019-04-26 LAB — PTH, INTACT AND CALCIUM
Calcium, Total (PTH): 8.3 mg/dL — ABNORMAL LOW (ref 8.6–10.2)
PTH: 210 pg/mL — ABNORMAL HIGH (ref 15–65)

## 2019-04-29 ENCOUNTER — Other Ambulatory Visit (HOSPITAL_COMMUNITY): Payer: Self-pay | Admitting: *Deleted

## 2019-04-29 DIAGNOSIS — C7A019 Malignant carcinoid tumor of the small intestine, unspecified portion: Secondary | ICD-10-CM

## 2019-04-29 MED ORDER — EVEROLIMUS 5 MG PO TABS: 5.0000 mg | ORAL_TABLET | Freq: Every day | ORAL | 2 refills | Status: DC

## 2019-05-06 DIAGNOSIS — N185 Chronic kidney disease, stage 5: Secondary | ICD-10-CM | POA: Diagnosis not present

## 2019-05-06 DIAGNOSIS — E785 Hyperlipidemia, unspecified: Secondary | ICD-10-CM | POA: Diagnosis not present

## 2019-05-06 DIAGNOSIS — M818 Other osteoporosis without current pathological fracture: Secondary | ICD-10-CM | POA: Diagnosis not present

## 2019-05-06 DIAGNOSIS — I10 Essential (primary) hypertension: Secondary | ICD-10-CM | POA: Diagnosis not present

## 2019-05-07 DIAGNOSIS — E1140 Type 2 diabetes mellitus with diabetic neuropathy, unspecified: Secondary | ICD-10-CM | POA: Diagnosis not present

## 2019-05-07 DIAGNOSIS — E1151 Type 2 diabetes mellitus with diabetic peripheral angiopathy without gangrene: Secondary | ICD-10-CM | POA: Diagnosis not present

## 2019-05-08 ENCOUNTER — Other Ambulatory Visit: Payer: Self-pay

## 2019-05-08 DIAGNOSIS — E559 Vitamin D deficiency, unspecified: Secondary | ICD-10-CM | POA: Diagnosis not present

## 2019-05-08 DIAGNOSIS — R809 Proteinuria, unspecified: Secondary | ICD-10-CM | POA: Diagnosis not present

## 2019-05-08 DIAGNOSIS — I770 Arteriovenous fistula, acquired: Secondary | ICD-10-CM | POA: Diagnosis not present

## 2019-05-08 DIAGNOSIS — Z79899 Other long term (current) drug therapy: Secondary | ICD-10-CM | POA: Diagnosis not present

## 2019-05-08 DIAGNOSIS — I120 Hypertensive chronic kidney disease with stage 5 chronic kidney disease or end stage renal disease: Secondary | ICD-10-CM | POA: Diagnosis not present

## 2019-05-08 DIAGNOSIS — D631 Anemia in chronic kidney disease: Secondary | ICD-10-CM | POA: Diagnosis not present

## 2019-05-08 DIAGNOSIS — N185 Chronic kidney disease, stage 5: Secondary | ICD-10-CM | POA: Diagnosis not present

## 2019-05-08 DIAGNOSIS — E211 Secondary hyperparathyroidism, not elsewhere classified: Secondary | ICD-10-CM | POA: Diagnosis not present

## 2019-05-08 DIAGNOSIS — E872 Acidosis: Secondary | ICD-10-CM | POA: Diagnosis not present

## 2019-05-08 DIAGNOSIS — N189 Chronic kidney disease, unspecified: Secondary | ICD-10-CM | POA: Diagnosis not present

## 2019-05-08 DIAGNOSIS — I5032 Chronic diastolic (congestive) heart failure: Secondary | ICD-10-CM | POA: Diagnosis not present

## 2019-05-09 ENCOUNTER — Inpatient Hospital Stay (HOSPITAL_COMMUNITY): Payer: Medicare Other

## 2019-05-09 VITALS — BP 107/59 | HR 68 | Temp 96.9°F | Resp 18

## 2019-05-09 DIAGNOSIS — C7A00 Malignant carcinoid tumor of unspecified site: Secondary | ICD-10-CM

## 2019-05-09 DIAGNOSIS — D500 Iron deficiency anemia secondary to blood loss (chronic): Secondary | ICD-10-CM

## 2019-05-09 DIAGNOSIS — E876 Hypokalemia: Secondary | ICD-10-CM | POA: Diagnosis not present

## 2019-05-09 DIAGNOSIS — C7B02 Secondary carcinoid tumors of liver: Secondary | ICD-10-CM | POA: Diagnosis not present

## 2019-05-09 DIAGNOSIS — D631 Anemia in chronic kidney disease: Secondary | ICD-10-CM | POA: Diagnosis not present

## 2019-05-09 DIAGNOSIS — N189 Chronic kidney disease, unspecified: Secondary | ICD-10-CM | POA: Diagnosis not present

## 2019-05-09 LAB — COMPREHENSIVE METABOLIC PANEL
ALT: 21 U/L (ref 0–44)
AST: 56 U/L — ABNORMAL HIGH (ref 15–41)
Albumin: 3.3 g/dL — ABNORMAL LOW (ref 3.5–5.0)
Alkaline Phosphatase: 54 U/L (ref 38–126)
Anion gap: 14 (ref 5–15)
BUN: 77 mg/dL — ABNORMAL HIGH (ref 8–23)
CO2: 32 mmol/L (ref 22–32)
Calcium: 7.9 mg/dL — ABNORMAL LOW (ref 8.9–10.3)
Chloride: 90 mmol/L — ABNORMAL LOW (ref 98–111)
Creatinine, Ser: 5.04 mg/dL — ABNORMAL HIGH (ref 0.61–1.24)
GFR calc Af Amer: 12 mL/min — ABNORMAL LOW (ref >60)
GFR calc non Af Amer: 10 mL/min — ABNORMAL LOW (ref >60)
Glucose, Bld: 123 mg/dL — ABNORMAL HIGH (ref 70–99)
Potassium: 2.2 mmol/L — CL (ref 3.5–5.1)
Sodium: 136 mmol/L (ref 135–145)
Total Bilirubin: 1.1 mg/dL (ref 0.3–1.2)
Total Protein: 6.2 g/dL — ABNORMAL LOW (ref 6.5–8.1)

## 2019-05-09 LAB — CBC WITH DIFFERENTIAL/PLATELET
Abs Immature Granulocytes: 0.01 10*3/uL (ref 0.00–0.07)
Basophils Absolute: 0 10*3/uL (ref 0.0–0.1)
Basophils Relative: 1 %
Eosinophils Absolute: 0.1 10*3/uL (ref 0.0–0.5)
Eosinophils Relative: 1 %
HCT: 30.4 % — ABNORMAL LOW (ref 39.0–52.0)
Hemoglobin: 9.6 g/dL — ABNORMAL LOW (ref 13.0–17.0)
Immature Granulocytes: 0 %
Lymphocytes Relative: 8 %
Lymphs Abs: 0.3 10*3/uL — ABNORMAL LOW (ref 0.7–4.0)
MCH: 26.2 pg (ref 26.0–34.0)
MCHC: 31.6 g/dL (ref 30.0–36.0)
MCV: 82.8 fL (ref 80.0–100.0)
Monocytes Absolute: 0.6 10*3/uL (ref 0.1–1.0)
Monocytes Relative: 15 %
Neutro Abs: 3.1 10*3/uL (ref 1.7–7.7)
Neutrophils Relative %: 75 %
Platelets: 137 10*3/uL — ABNORMAL LOW (ref 150–400)
RBC: 3.67 MIL/uL — ABNORMAL LOW (ref 4.22–5.81)
RDW: 14 % (ref 11.5–15.5)
WBC: 4.2 10*3/uL (ref 4.0–10.5)
nRBC: 0 % (ref 0.0–0.2)

## 2019-05-09 LAB — POTASSIUM: Potassium: 2.8 mmol/L — ABNORMAL LOW (ref 3.5–5.1)

## 2019-05-09 MED ORDER — POTASSIUM CHLORIDE 10 MEQ/100ML IV SOLN
10.0000 meq | INTRAVENOUS | Status: AC
  Administered 2019-05-09 (×2): 10 meq via INTRAVENOUS
  Filled 2019-05-09 (×2): qty 100

## 2019-05-09 MED ORDER — EPOETIN ALFA-EPBX 10000 UNIT/ML IJ SOLN
20000.0000 [IU] | Freq: Once | INTRAMUSCULAR | Status: AC
  Administered 2019-05-09: 20000 [IU] via SUBCUTANEOUS
  Filled 2019-05-09: qty 2

## 2019-05-09 MED ORDER — OCTREOTIDE ACETATE 30 MG IM KIT
30.0000 mg | PACK | Freq: Once | INTRAMUSCULAR | Status: AC
  Administered 2019-05-09: 30 mg via INTRAMUSCULAR
  Filled 2019-05-09: qty 1

## 2019-05-09 MED ORDER — POTASSIUM CHLORIDE CRYS ER 20 MEQ PO TBCR
40.0000 meq | EXTENDED_RELEASE_TABLET | Freq: Once | ORAL | Status: AC
  Administered 2019-05-09: 40 meq via ORAL
  Filled 2019-05-09: qty 2

## 2019-05-09 MED ORDER — POTASSIUM CHLORIDE CRYS ER 20 MEQ PO TBCR
40.0000 meq | EXTENDED_RELEASE_TABLET | Freq: Once | ORAL | Status: AC
  Administered 2019-05-09: 14:00:00 40 meq via ORAL
  Filled 2019-05-09: qty 2

## 2019-05-09 MED ORDER — SODIUM CHLORIDE 0.9 % IV SOLN
INTRAVENOUS | Status: DC
  Administered 2019-05-09: 12:00:00 via INTRAVENOUS

## 2019-05-09 NOTE — Progress Notes (Signed)
CRITICAL VALUE ALERT  Critical Value:  K+ 2.2  Date & Time Notied:  05/09/2019 at 1120  Provider Notified: K. Nester, NP  Orders Received/Actions taken: Give KCl 29mEq IV and K-dur 1mEq PO pre and post KCl infusion.  Recheck K+ prior to discharge.  Also advised per NP to instruct pt to STOP Lasix due to severe hypokalemia and creatinine of 5.04.  Pt notified and verbalizes understanding.  He reports he is followed by a nephrologist at [facility] in [place] and that he will be starting on dialysis soon.

## 2019-05-09 NOTE — Progress Notes (Signed)
Pt presents today for Sandostatin and Retacrit injection. Labs abnormal today. K+ 2.2, Creat. 5.04. MAR reviewed and updated. Pt verbalized understanding to stop taking his Lasix per RNester NP VO. Pt has no complaints of any pain.  58meq of K+ IV infused today. 43meq pre and post K+ infusion administered.  K+infusion  given today per MD orders. Tolerated infusion without adverse affects. Vital signs stable. No complaints at this time. Repeat K+ drawn and sent to the lab per RNester VO. Per NP pt may discharge after lab draw.  Discharged from clinic ambulatory. F/U with [facility] as scheduled.

## 2019-05-14 LAB — CHROMOGRANIN A: Chromogranin A (ng/mL): 421.9 ng/mL — ABNORMAL HIGH (ref 0.0–101.8)

## 2019-05-15 ENCOUNTER — Inpatient Hospital Stay (HOSPITAL_BASED_OUTPATIENT_CLINIC_OR_DEPARTMENT_OTHER): Payer: Medicare Other | Admitting: Hematology

## 2019-05-15 ENCOUNTER — Other Ambulatory Visit: Payer: Self-pay

## 2019-05-15 DIAGNOSIS — D631 Anemia in chronic kidney disease: Secondary | ICD-10-CM | POA: Diagnosis not present

## 2019-05-15 DIAGNOSIS — R7611 Nonspecific reaction to tuberculin skin test without active tuberculosis: Secondary | ICD-10-CM | POA: Diagnosis not present

## 2019-05-15 DIAGNOSIS — C7A00 Malignant carcinoid tumor of unspecified site: Secondary | ICD-10-CM | POA: Diagnosis not present

## 2019-05-15 DIAGNOSIS — C7B02 Secondary carcinoid tumors of liver: Secondary | ICD-10-CM | POA: Diagnosis not present

## 2019-05-15 DIAGNOSIS — N189 Chronic kidney disease, unspecified: Secondary | ICD-10-CM | POA: Diagnosis not present

## 2019-05-15 DIAGNOSIS — E876 Hypokalemia: Secondary | ICD-10-CM | POA: Diagnosis not present

## 2019-05-15 NOTE — Progress Notes (Signed)
[address] [place], [place] [address]   CLINIC:  Medical Oncology/Hematology  PCP:  [name], MD [place] [phone]   REASON FOR VISIT:  Follow-up for carcinoid tumor  CURRENT THERAPY: Sandostatin    INTERVAL HISTORY:  Mr. Killings 78 y.o. male presents today for follow-up.  He reports overall doing well.  He denies any significant fatigue.  Denies any change in bowel habits.  No diarrhea.  Appetite is stable.  No weight loss.  He continues on monthly Sandostatin injections, tolerating well.  He is also on everolimus.  He is here for repeat labs and office visit.   REVIEW OF SYSTEMS:  Review of Systems  Constitutional: Negative.   HENT:  Negative.   Eyes: Negative.   Respiratory: Negative.   Cardiovascular: Negative.   Gastrointestinal: Negative.   Endocrine: Negative.   Genitourinary: Negative.    Musculoskeletal: Positive for arthralgias.  Skin: Negative.   Neurological: Negative.   Hematological: Negative.   Psychiatric/Behavioral: Negative.      PAST MEDICAL/SURGICAL HISTORY:  Past Medical History:  Diagnosis Date  . Anemia   . Cancer [facility])    right renal . Prostate- Radiation treatment  . CHF (congestive heart failure) ([place])   . Chronic kidney disease    stage 4  . Edema   . Heart murmur    "nothing to worry about"  . Hyperlipidemia   . Hypertension   . Malignant carcinoid tumor ([place]) 01/03/2016   Past Surgical History:  Procedure Laterality Date  . AV FISTULA PLACEMENT Left 10/20/2014   Procedure: Left Arm ARTERIOVENOUS (AV) FISTULA CREATION;  Surgeon: Elam Dutch, MD;  Location: [facility];  Service: Vascular;  Laterality: Left;  . NEPHRECTOMY Right 2010     SOCIAL HISTORY:  Social History   Socioeconomic History  . Marital status: Married    Spouse name: Not on file  . Number of children: Not on file  . Years of education: Not on file  . Highest education level: Not on  file  Occupational History  . Not on file  Social Needs  . Financial resource strain: Not on file  . Food insecurity    Worry: Not on file    Inability: Not on file  . Transportation needs    Medical: Not on file    Non-medical: Not on file  Tobacco Use  . Smoking status: Never Smoker  . Smokeless tobacco: Never Used  Substance and Sexual Activity  . Alcohol use: No    Alcohol/week: 0.0 standard drinks  . Drug use: No  . Sexual activity: Not on file  Lifestyle  . Physical activity    Days per week: Not on file    Minutes per session: Not on file  . Stress: Not on file  Relationships  . Social Herbalist on phone: Not on file    Gets together: Not on file    Attends religious service: Not on file    Active member of club or organization: Not on file    Attends meetings of clubs or organizations: Not on file    Relationship status: Not on file  . Intimate partner violence    Fear of current or ex partner: Not on file    Emotionally abused: Not on file    Physically abused: Not on file    Forced sexual activity: Not on file  Other Topics Concern  . Not  on file  Social History Narrative  . Not on file    FAMILY HISTORY:  Family History  Problem Relation Age of Onset  . Cancer Mother   . Hypertension Father   . Cancer Sister     CURRENT MEDICATIONS:  Outpatient Encounter Medications as of 05/15/2019  Medication Sig Note  . alendronate (FOSAMAX) 70 MG tablet TAKE ONE TABLET BY MOUTH ONCE A WEEK IN THE MORNING WITH A FULL GLASS OF WATER ON AN EMPTY STOMACH. DO NOT LAY DOWN FOR 30 MINUTES.   [name] amLODipine (NORVASC) 10 MG tablet Take 10 mg by mouth daily.    [name] atorvastatin (LIPITOR) 80 MG tablet Take 80 mg by mouth daily at 6 PM.    . calcitRIOL (ROCALTROL) 0.25 MCG capsule Take 0.25 mcg by mouth daily.  04/26/2016: Received from: External Pharmacy  . carvedilol (COREG) 3.125 MG tablet Take 3.125 mg by mouth 2 (two) times daily with a meal.    .  Cholecalciferol (VITAMIN D3) 250 MCG (10000 UT) TABS Take by mouth.   . dexamethasone (DECADRON) 0.5 MG/5ML solution SWISH TEN ML BY MOUTH FOUR TIMES DAILY FOR TWO MINUTES THEN SPIT   . epoetin alfa (EPOGEN) 4000 UNIT/ML injection Inject into the skin.   [name] everolimus (AFINITOR) 5 MG tablet Take 1 tablet (5 mg total) by mouth daily.   . hydrALAZINE (APRESOLINE) 25 MG tablet TAKE THREE (3) TABLETS BY MOUTH TWICE DAILY 07/19/2016: Received from: External Pharmacy  . iron polysaccharides (NIFEREX) 150 MG capsule Take by mouth.   . metolazone (ZAROXOLYN) 5 MG tablet Take 5 mg by mouth every other day.   [name] octreotide (SANDOSTATIN LAR DEPOT) 30 MG injection Inject into the muscle.   [name] octreotide (SANDOSTATIN LAR) 30 MG injection Inject into the muscle.   . Omega-3 Fatty Acids (FISH OIL) 1000 MG CAPS Take 1 capsule by mouth daily.   . sodium bicarbonate 650 MG tablet Take 650 mg by mouth daily.   . tamsulosin (FLOMAX) 0.4 MG CAPS capsule Take 0.4 mg by mouth daily.  04/26/2016: Received from: External Pharmacy  . [DISCONTINUED] FERREX 150 150 MG capsule Take 150 mg by mouth 2 (two) times daily.   . [DISCONTINUED] furosemide (LASIX) 40 MG tablet Take 40 mg by mouth.   . [DISCONTINUED] labetalol (NORMODYNE) 300 MG tablet Take by mouth.   . [DISCONTINUED] potassium chloride (KLOR-CON) 10 MEQ tablet Take by mouth.   . [DISCONTINUED] atorvastatin (LIPITOR) 80 MG tablet Take by mouth.   . [DISCONTINUED] hydrALAZINE (APRESOLINE) 50 MG tablet Take by mouth.   . [DISCONTINUED] sodium bicarbonate 650 MG tablet Take by mouth.    No facility-administered encounter medications on file as of 05/15/2019.     ALLERGIES:  No Known Allergies   PHYSICAL EXAM:  ECOG Performance status: 1  Vitals:   05/15/19 1455  BP: (!) 107/54  Pulse: 73  Resp: 16  Temp: (!) 97.3 F (36.3 C)  SpO2: 99%   Filed Weights   05/15/19 1455  Weight: 189 lb (85.7 kg)    Physical Exam Constitutional:      Appearance: Normal  appearance.  HENT:     Head: Normocephalic.     Right Ear: External ear normal.     Left Ear: External ear normal.     Nose: Nose normal.     Mouth/Throat:     Pharynx: Oropharynx is clear.  Eyes:     Conjunctiva/sclera: Conjunctivae normal.  Neck:     Musculoskeletal: Normal range of motion.  Cardiovascular:     Rate and Rhythm: Normal rate and regular rhythm.     Pulses: Normal pulses.     Heart sounds: Normal heart sounds.  Pulmonary:     Effort: Pulmonary effort is normal.     Breath sounds: Normal breath sounds.  Abdominal:     General: Bowel sounds are normal.  Musculoskeletal: Normal range of motion.  Skin:    General: Skin is warm.  Neurological:     General: No focal deficit present.     Mental Status: He is alert and oriented to person, place, and time.  Psychiatric:        Mood and Affect: Mood normal.        Behavior: Behavior normal.        Thought Content: Thought content normal.        Judgment: Judgment normal.      LABORATORY DATA:  I have reviewed the labs as listed.  CBC    Component Value Date/Time   WBC 4.2 05/09/2019 1049   RBC 3.67 (L) 05/09/2019 1049   HGB 9.6 (L) 05/09/2019 1049   HCT 30.4 (L) 05/09/2019 1049   PLT 137 (L) 05/09/2019 1049   MCV 82.8 05/09/2019 1049   MCH 26.2 05/09/2019 1049   MCHC 31.6 05/09/2019 1049   RDW 14.0 05/09/2019 1049   LYMPHSABS 0.3 (L) 05/09/2019 1049   MONOABS 0.6 05/09/2019 1049   EOSABS 0.1 05/09/2019 1049   BASOSABS 0.0 05/09/2019 1049   CMP Latest Ref Rng & Units 05/09/2019 05/09/2019 04/25/2019  Glucose 70 - 99 mg/dL - 123(H) 133(H)  BUN 8 - 23 mg/dL - 77(H) 85(H)  Creatinine 0.61 - 1.24 mg/dL - 5.04(H) 5.03(H)  Sodium 135 - 145 mmol/L - 136 136  Potassium 3.5 - 5.1 mmol/L 2.8(L) 2.2(LL) 2.9(L)  Chloride 98 - 111 mmol/L - 90(L) 94(L)  CO2 22 - 32 mmol/L - 32 29  Calcium 8.9 - 10.3 mg/dL - 7.9(L) 8.3(L)  Total Protein 6.5 - 8.1 g/dL - 6.2(L) -  Total Bilirubin 0.3 - 1.2 mg/dL - 1.1 -  Alkaline  Phos 38 - 126 U/L - 54 -  AST 15 - 41 U/L - 56(H) -  ALT 0 - 44 U/L - 21 -        ASSESSMENT & PLAN:   Malignant carcinoid tumor (HCC) 1.  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  Initially managed at [facility] center.  Transferred care to [facility] in October 2017. - Gallium-68 scan on 10/23/2017 shows metastatic disease in the liver, central right mesenteric mass and multiple peritoneal deposits. -Gallium-68 dotatate PET scan on 04/22/2018 showed multiple foci of intense radiotracer activity consistent with metastatic well-differentiated carcinoid tumor.  Metastatic sites include central mesentery, multiple peritoneal implants, liver metastasis.  Several metastatic lesions have measured higher activity.  Additional mild increase in size of central mesenteric mass.  Lesion within the right ventricle along the interventricular septum thought to be metastatic. - 2D echo on 05/03/2018 showed normal cavity size of the right ventricle with normal systolic function.  -Everolimus 5 mg daily started on 05/17/2018 along with dexamethasone mouthwash. -He denies any symptoms of carcinoid syndrome including flushing, wheezing or diarrhea. -Denies any abdominal pains.  He is tolerating everolimus very well. - Gallium-68 dotatate scan on 11/27/2018 shows overall stable well-differentiated neuroendocrine tumor metastasis at multiple sites in the liver, peritoneum and potentially heart.  Stable activity of liver lesions.  Decrease in size and activity of the right central mesenteric mass.  Decrease in activity of peritoneal implants. - Chromogranin A remains elevated but stable at 421.9 - Restaging gallium-68 dotatate scan on April 08, 2019 revealed no new evidence of disease progression.  Overall his exam was stable compared to prior.  There was a new nodularity in the right lung base that was non-FDG avid. -He will continue everolimus 5 mg  daily at this time.  He will continue monthly Sandostatin injections. - RTC in 4 weeks.  We will repeat a chromogranin a level prior to his next visit.  2.  Normocytic anemia: -Combination anemia from CKD, iron deficiency and bone marrow suppression from everolimus. -He was started on Procrit 20,000 units every 2 weeks on 09/26/2018. - Hemoglobin remains stable with procrit 20,000 units.            [place] [phone]

## 2019-05-15 NOTE — Assessment & Plan Note (Signed)
1.  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  Initially managed at [facility].  Transferred care to [facility] in October 2017. - Gallium-68 scan on 10/23/2017 shows metastatic disease in the liver, central right mesenteric mass and multiple peritoneal deposits. -Gallium-68 dotatate PET scan on 04/22/2018 showed multiple foci of intense radiotracer activity consistent with metastatic well-differentiated carcinoid tumor.  Metastatic sites include central mesentery, multiple peritoneal implants, liver metastasis.  Several metastatic lesions have measured higher activity.  Additional mild increase in size of central mesenteric mass.  Lesion within the right ventricle along the interventricular septum thought to be metastatic. - 2D echo on 05/03/2018 showed normal cavity size of the right ventricle with normal systolic function.  -Everolimus 5 mg daily started on 05/17/2018 along with dexamethasone mouthwash. -He denies any symptoms of carcinoid syndrome including flushing, wheezing or diarrhea. -Denies any abdominal pains.  He is tolerating everolimus very well. - Gallium-68 dotatate scan on 11/27/2018 shows overall stable well-differentiated neuroendocrine tumor metastasis at multiple sites in the liver, peritoneum and potentially heart.  Stable activity of liver lesions.  Decrease in size and activity of the right central mesenteric mass.  Decrease in activity of peritoneal implants. - Chromogranin A remains elevated but stable at 421.9 - Restaging gallium-68 dotatate scan on April 08, 2019 revealed no new evidence of disease progression.  Overall his exam was stable compared to prior.  There was a new nodularity in the right lung base that was non-FDG avid. -He will continue everolimus 5 mg daily at this time.  He will continue monthly Sandostatin injections. - RTC in 4 weeks.  We will repeat a chromogranin a level prior to his next  visit.  2.  Normocytic anemia: -Combination anemia from CKD, iron deficiency and bone marrow suppression from everolimus. -He was started on Procrit 20,000 units every 2 weeks on 09/26/2018. - Hemoglobin remains stable with procrit 20,000 units.

## 2019-05-20 DIAGNOSIS — Z23 Encounter for immunization: Secondary | ICD-10-CM | POA: Diagnosis not present

## 2019-05-20 DIAGNOSIS — N2581 Secondary hyperparathyroidism of renal origin: Secondary | ICD-10-CM | POA: Diagnosis not present

## 2019-05-20 DIAGNOSIS — N186 End stage renal disease: Secondary | ICD-10-CM | POA: Diagnosis not present

## 2019-05-20 DIAGNOSIS — I259 Chronic ischemic heart disease, unspecified: Secondary | ICD-10-CM | POA: Diagnosis not present

## 2019-05-20 DIAGNOSIS — Z992 Dependence on renal dialysis: Secondary | ICD-10-CM | POA: Diagnosis not present

## 2019-05-20 DIAGNOSIS — R7611 Nonspecific reaction to tuberculin skin test without active tuberculosis: Secondary | ICD-10-CM | POA: Diagnosis not present

## 2019-05-20 DIAGNOSIS — D631 Anemia in chronic kidney disease: Secondary | ICD-10-CM | POA: Diagnosis not present

## 2019-05-20 DIAGNOSIS — D509 Iron deficiency anemia, unspecified: Secondary | ICD-10-CM | POA: Diagnosis not present

## 2019-05-22 DIAGNOSIS — D631 Anemia in chronic kidney disease: Secondary | ICD-10-CM | POA: Diagnosis not present

## 2019-05-22 DIAGNOSIS — N186 End stage renal disease: Secondary | ICD-10-CM | POA: Diagnosis not present

## 2019-05-22 DIAGNOSIS — N2581 Secondary hyperparathyroidism of renal origin: Secondary | ICD-10-CM | POA: Diagnosis not present

## 2019-05-22 DIAGNOSIS — Z23 Encounter for immunization: Secondary | ICD-10-CM | POA: Diagnosis not present

## 2019-05-22 DIAGNOSIS — D509 Iron deficiency anemia, unspecified: Secondary | ICD-10-CM | POA: Diagnosis not present

## 2019-05-22 DIAGNOSIS — Z992 Dependence on renal dialysis: Secondary | ICD-10-CM | POA: Diagnosis not present

## 2019-05-23 ENCOUNTER — Encounter (HOSPITAL_COMMUNITY): Payer: Self-pay

## 2019-05-23 ENCOUNTER — Ambulatory Visit (HOSPITAL_COMMUNITY): Payer: Medicare Other

## 2019-05-23 ENCOUNTER — Other Ambulatory Visit: Payer: Self-pay

## 2019-05-23 ENCOUNTER — Other Ambulatory Visit (HOSPITAL_COMMUNITY): Payer: Medicare Other

## 2019-05-23 ENCOUNTER — Inpatient Hospital Stay (HOSPITAL_COMMUNITY): Payer: Medicare Other | Attending: Hematology

## 2019-05-23 ENCOUNTER — Other Ambulatory Visit (HOSPITAL_COMMUNITY): Payer: Self-pay | Admitting: Nurse Practitioner

## 2019-05-23 ENCOUNTER — Inpatient Hospital Stay (HOSPITAL_COMMUNITY): Payer: Medicare Other

## 2019-05-23 VITALS — BP 101/56 | HR 73 | Temp 97.3°F | Resp 18

## 2019-05-23 DIAGNOSIS — D631 Anemia in chronic kidney disease: Secondary | ICD-10-CM

## 2019-05-23 DIAGNOSIS — Z992 Dependence on renal dialysis: Secondary | ICD-10-CM | POA: Diagnosis not present

## 2019-05-23 DIAGNOSIS — C7B02 Secondary carcinoid tumors of liver: Secondary | ICD-10-CM | POA: Diagnosis not present

## 2019-05-23 DIAGNOSIS — D509 Iron deficiency anemia, unspecified: Secondary | ICD-10-CM

## 2019-05-23 DIAGNOSIS — N189 Chronic kidney disease, unspecified: Secondary | ICD-10-CM

## 2019-05-23 DIAGNOSIS — N186 End stage renal disease: Secondary | ICD-10-CM | POA: Insufficient documentation

## 2019-05-23 DIAGNOSIS — C7A00 Malignant carcinoid tumor of unspecified site: Secondary | ICD-10-CM | POA: Insufficient documentation

## 2019-05-23 DIAGNOSIS — I959 Hypotension, unspecified: Secondary | ICD-10-CM | POA: Diagnosis not present

## 2019-05-23 LAB — CBC
HCT: 26.8 % — ABNORMAL LOW (ref 39.0–52.0)
Hemoglobin: 8.4 g/dL — ABNORMAL LOW (ref 13.0–17.0)
MCH: 25.8 pg — ABNORMAL LOW (ref 26.0–34.0)
MCHC: 31.3 g/dL (ref 30.0–36.0)
MCV: 82.5 fL (ref 80.0–100.0)
Platelets: 108 10*3/uL — ABNORMAL LOW (ref 150–400)
RBC: 3.25 MIL/uL — ABNORMAL LOW (ref 4.22–5.81)
RDW: 13.6 % (ref 11.5–15.5)
WBC: 3.5 10*3/uL — ABNORMAL LOW (ref 4.0–10.5)
nRBC: 0 % (ref 0.0–0.2)

## 2019-05-23 MED ORDER — EPOETIN ALFA-EPBX 10000 UNIT/ML IJ SOLN
INTRAMUSCULAR | Status: AC
  Filled 2019-05-23: qty 2

## 2019-05-23 MED ORDER — EPOETIN ALFA-EPBX 10000 UNIT/ML IJ SOLN
20000.0000 [IU] | Freq: Once | INTRAMUSCULAR | Status: AC
  Administered 2019-05-23: 20000 [IU] via SUBCUTANEOUS

## 2019-05-24 DIAGNOSIS — N186 End stage renal disease: Secondary | ICD-10-CM | POA: Diagnosis not present

## 2019-05-24 DIAGNOSIS — Z23 Encounter for immunization: Secondary | ICD-10-CM | POA: Diagnosis not present

## 2019-05-24 DIAGNOSIS — Z992 Dependence on renal dialysis: Secondary | ICD-10-CM | POA: Diagnosis not present

## 2019-05-24 DIAGNOSIS — N2581 Secondary hyperparathyroidism of renal origin: Secondary | ICD-10-CM | POA: Diagnosis not present

## 2019-05-24 DIAGNOSIS — D509 Iron deficiency anemia, unspecified: Secondary | ICD-10-CM | POA: Diagnosis not present

## 2019-05-24 DIAGNOSIS — D631 Anemia in chronic kidney disease: Secondary | ICD-10-CM | POA: Diagnosis not present

## 2019-05-27 DIAGNOSIS — Z992 Dependence on renal dialysis: Secondary | ICD-10-CM | POA: Diagnosis not present

## 2019-05-27 DIAGNOSIS — D631 Anemia in chronic kidney disease: Secondary | ICD-10-CM | POA: Diagnosis not present

## 2019-05-27 DIAGNOSIS — Z23 Encounter for immunization: Secondary | ICD-10-CM | POA: Diagnosis not present

## 2019-05-27 DIAGNOSIS — N2581 Secondary hyperparathyroidism of renal origin: Secondary | ICD-10-CM | POA: Diagnosis not present

## 2019-05-27 DIAGNOSIS — D509 Iron deficiency anemia, unspecified: Secondary | ICD-10-CM | POA: Diagnosis not present

## 2019-05-27 DIAGNOSIS — N186 End stage renal disease: Secondary | ICD-10-CM | POA: Diagnosis not present

## 2019-05-29 DIAGNOSIS — D509 Iron deficiency anemia, unspecified: Secondary | ICD-10-CM | POA: Diagnosis not present

## 2019-05-29 DIAGNOSIS — N2581 Secondary hyperparathyroidism of renal origin: Secondary | ICD-10-CM | POA: Diagnosis not present

## 2019-05-29 DIAGNOSIS — D631 Anemia in chronic kidney disease: Secondary | ICD-10-CM | POA: Diagnosis not present

## 2019-05-29 DIAGNOSIS — N186 End stage renal disease: Secondary | ICD-10-CM | POA: Diagnosis not present

## 2019-05-29 DIAGNOSIS — Z23 Encounter for immunization: Secondary | ICD-10-CM | POA: Diagnosis not present

## 2019-05-29 DIAGNOSIS — Z992 Dependence on renal dialysis: Secondary | ICD-10-CM | POA: Diagnosis not present

## 2019-05-31 DIAGNOSIS — D509 Iron deficiency anemia, unspecified: Secondary | ICD-10-CM | POA: Diagnosis not present

## 2019-05-31 DIAGNOSIS — N2581 Secondary hyperparathyroidism of renal origin: Secondary | ICD-10-CM | POA: Diagnosis not present

## 2019-05-31 DIAGNOSIS — Z992 Dependence on renal dialysis: Secondary | ICD-10-CM | POA: Diagnosis not present

## 2019-05-31 DIAGNOSIS — N186 End stage renal disease: Secondary | ICD-10-CM | POA: Diagnosis not present

## 2019-05-31 DIAGNOSIS — D631 Anemia in chronic kidney disease: Secondary | ICD-10-CM | POA: Diagnosis not present

## 2019-05-31 DIAGNOSIS — Z23 Encounter for immunization: Secondary | ICD-10-CM | POA: Diagnosis not present

## 2019-06-02 DIAGNOSIS — E7849 Other hyperlipidemia: Secondary | ICD-10-CM | POA: Diagnosis not present

## 2019-06-02 DIAGNOSIS — I10 Essential (primary) hypertension: Secondary | ICD-10-CM | POA: Diagnosis not present

## 2019-06-02 DIAGNOSIS — N185 Chronic kidney disease, stage 5: Secondary | ICD-10-CM | POA: Diagnosis not present

## 2019-06-02 DIAGNOSIS — M818 Other osteoporosis without current pathological fracture: Secondary | ICD-10-CM | POA: Diagnosis not present

## 2019-06-03 DIAGNOSIS — D631 Anemia in chronic kidney disease: Secondary | ICD-10-CM | POA: Diagnosis not present

## 2019-06-03 DIAGNOSIS — Z992 Dependence on renal dialysis: Secondary | ICD-10-CM | POA: Diagnosis not present

## 2019-06-03 DIAGNOSIS — N2581 Secondary hyperparathyroidism of renal origin: Secondary | ICD-10-CM | POA: Diagnosis not present

## 2019-06-03 DIAGNOSIS — N186 End stage renal disease: Secondary | ICD-10-CM | POA: Diagnosis not present

## 2019-06-03 DIAGNOSIS — Z23 Encounter for immunization: Secondary | ICD-10-CM | POA: Diagnosis not present

## 2019-06-03 DIAGNOSIS — D509 Iron deficiency anemia, unspecified: Secondary | ICD-10-CM | POA: Diagnosis not present

## 2019-06-04 ENCOUNTER — Other Ambulatory Visit: Payer: Self-pay | Admitting: Internal Medicine

## 2019-06-04 ENCOUNTER — Ambulatory Visit (INDEPENDENT_AMBULATORY_CARE_PROVIDER_SITE_OTHER): Payer: Medicare Other | Admitting: Internal Medicine

## 2019-06-04 ENCOUNTER — Encounter: Payer: Self-pay | Admitting: Internal Medicine

## 2019-06-04 ENCOUNTER — Other Ambulatory Visit: Payer: Self-pay

## 2019-06-04 VITALS — BP 119/73 | HR 76 | Temp 98.0°F | Ht 78.0 in | Wt 187.0 lb

## 2019-06-04 DIAGNOSIS — Z114 Encounter for screening for human immunodeficiency virus [HIV]: Secondary | ICD-10-CM | POA: Diagnosis not present

## 2019-06-04 DIAGNOSIS — Z227 Latent tuberculosis: Secondary | ICD-10-CM | POA: Diagnosis not present

## 2019-06-04 MED ORDER — PYRIDOXINE HCL 50 MG PO TABS: 50.0000 mg | ORAL_TABLET | Freq: Every day | ORAL | 8 refills | Status: DC

## 2019-06-04 MED ORDER — ISONIAZID 300 MG PO TABS: 300.0000 mg | ORAL_TABLET | Freq: Every day | ORAL | 8 refills | Status: DC

## 2019-06-04 NOTE — Progress Notes (Signed)
Patient signed medical release form and form faxed to [facility] records for x-rays. Allen Davenport

## 2019-06-04 NOTE — Progress Notes (Signed)
Called patient and made aware of prescriptions have been sent to [facility] in [place]

## 2019-06-04 NOTE — Progress Notes (Signed)
[facility] for Infectious Disease      Reason for Consult: latent Tb    Referring Physician: Dr. Theador Hawthorne    Patient ID: Allen Davenport, male    DOB: February 24, 1941, 78 y.o.   MRN: [id number]  HPI:   Here for evaluation of a positive Quantiferon Gold test.  He has a history of a neuroendocrine tumor on Sandostatin and everolimus.  He has renal failure and followed by nephrology.  He had a Quantiferon Gold test done by nephrology and is positive.  He has had no cough, no congestion, no significant weight loss, no night sweat or hemoptysis.   Previous record reviewed from the records sent including the positive test.  Also, a CXR report was requested and shows no issues.    Past Medical History:  Diagnosis Date  . Anemia   . Cancer [facility])    right renal . Prostate- Radiation treatment  . CHF (congestive heart failure) ([place])   . Chronic kidney disease    stage 4  . Edema   . Heart murmur    "nothing to worry about"  . Hyperlipidemia   . Hypertension   . Malignant carcinoid tumor ([place]) 01/03/2016    Prior to Admission medications   Medication Sig Start Date End Date Taking? Authorizing Provider  alendronate (FOSAMAX) 70 MG tablet TAKE ONE TABLET BY MOUTH ONCE A WEEK IN THE MORNING WITH A FULL GLASS OF WATER ON AN EMPTY STOMACH. DO NOT LAY DOWN FOR 30 MINUTES. 03/27/19  Yes [provider]  amLODipine (NORVASC) 10 MG tablet Take 10 mg by mouth daily.  08/18/14  Yes [provider]  atorvastatin (LIPITOR) 80 MG tablet Take 80 mg by mouth daily at 6 PM.  11/04/13  Yes [provider]  calcitRIOL (ROCALTROL) 0.25 MCG capsule Take 0.25 mcg by mouth daily.  04/24/16  Yes [provider]  carvedilol (COREG) 3.125 MG tablet Take 3.125 mg by mouth 2 (two) times daily with a meal.  08/18/14  Yes [provider]  Cholecalciferol (VITAMIN D3) 250 MCG (10000 UT) TABS Take by mouth.   Yes [provider]  dexamethasone (DECADRON) 0.5 MG/5ML solution  SWISH TEN ML BY MOUTH FOUR TIMES DAILY FOR TWO MINUTES THEN SPIT 01/13/19  Yes Derek Jack, MD  epoetin alfa (EPOGEN) 4000 UNIT/ML injection Inject into the skin.   Yes [provider]  everolimus (AFINITOR) 5 MG tablet Take 1 tablet (5 mg total) by mouth daily. 04/29/19  Yes Roger Shelter, FNP  hydrALAZINE (APRESOLINE) 25 MG tablet TAKE THREE (3) TABLETS BY MOUTH TWICE DAILY 06/17/16  Yes [provider]  iron polysaccharides (NIFEREX) 150 MG capsule Take by mouth.   Yes [provider]  metolazone (ZAROXOLYN) 5 MG tablet Take 5 mg by mouth every other day. 10/31/18  Yes [provider]  octreotide (SANDOSTATIN LAR DEPOT) 30 MG injection Inject into the muscle. 08/18/14  Yes [provider]  octreotide (SANDOSTATIN LAR) 30 MG injection Inject into the muscle.   Yes [provider]  Omega-3 Fatty Acids (FISH OIL) 1000 MG CAPS Take 1 capsule by mouth daily.   Yes [provider]  sodium bicarbonate 650 MG tablet Take 650 mg by mouth daily.   Yes [provider]  tamsulosin (FLOMAX) 0.4 MG CAPS capsule Take 0.4 mg by mouth daily.  04/05/16  Yes [provider]    No Known Allergies  Social History   Tobacco Use  . Smoking status:  Never Smoker  . Smokeless tobacco: Never Used  Substance Use Topics  . Alcohol use: No    Alcohol/week: 0.0 standard drinks  . Drug use: No    Family History  Problem Relation Age of Onset  . Cancer Mother   . Hypertension Father   . Cancer Sister     Review of Systems  Constitutional: negative for fevers and chills Gastrointestinal: negative for nausea and diarrhea Integument/breast: negative for rash All other systems reviewed and are negative    Constitutional: in no apparent distress  Vitals:   06/04/19 1356  BP: 119/73  Pulse: 76  Temp: 98 F (36.7 C)   EYES: anicteric ENMT: no thrush Cardiovascular: Cor RRR Respiratory: CTA B; normal respiratory effort  GI: soft Musculoskeletal: no pedal edema noted Skin: negatives: no rash Neuro: non-focal  Labs: Lab Results  Component Value Date   WBC 3.5 (L) 05/23/2019   HGB 8.4 (L) 05/23/2019   HCT 26.8 (L) 05/23/2019   MCV 82.5 05/23/2019   PLT 108 (L) 05/23/2019    Lab Results  Component Value Date   CREATININE 5.04 (H) 05/09/2019   BUN 77 (H) 05/09/2019   NA 136 05/09/2019   K 2.8 (L) 05/09/2019   CL 90 (L) 05/09/2019   CO2 32 05/09/2019    Lab Results  Component Value Date   ALT 21 05/09/2019   AST 56 (H) 05/09/2019   ALKPHOS 54 05/09/2019   BILITOT 1.1 05/09/2019     Assessment: latent Tb.  Negative CXR.  With his current medications, will use INH + B6 for 9 months. I discussed side effects.    Plan: 1) INH + B6 for 9 months 2) CMP, HIV now 3) rtc 1 month for repeat CMP

## 2019-06-05 DIAGNOSIS — D509 Iron deficiency anemia, unspecified: Secondary | ICD-10-CM | POA: Diagnosis not present

## 2019-06-05 DIAGNOSIS — Z992 Dependence on renal dialysis: Secondary | ICD-10-CM | POA: Diagnosis not present

## 2019-06-05 DIAGNOSIS — N186 End stage renal disease: Secondary | ICD-10-CM | POA: Diagnosis not present

## 2019-06-05 DIAGNOSIS — D631 Anemia in chronic kidney disease: Secondary | ICD-10-CM | POA: Diagnosis not present

## 2019-06-05 DIAGNOSIS — Z23 Encounter for immunization: Secondary | ICD-10-CM | POA: Diagnosis not present

## 2019-06-05 DIAGNOSIS — N2581 Secondary hyperparathyroidism of renal origin: Secondary | ICD-10-CM | POA: Diagnosis not present

## 2019-06-05 LAB — COMPLETE METABOLIC PANEL WITH GFR
AG Ratio: 1.5 (calc) (ref 1.0–2.5)
ALT: 18 U/L (ref 9–46)
AST: 60 U/L — ABNORMAL HIGH (ref 10–35)
Albumin: 3.2 g/dL — ABNORMAL LOW (ref 3.6–5.1)
Alkaline phosphatase (APISO): 53 U/L (ref 35–144)
BUN/Creatinine Ratio: 5 (calc) — ABNORMAL LOW (ref 6–22)
BUN: 28 mg/dL — ABNORMAL HIGH (ref 7–25)
CO2: 30 mmol/L (ref 20–32)
Calcium: 7.3 mg/dL — ABNORMAL LOW (ref 8.6–10.3)
Chloride: 96 mmol/L — ABNORMAL LOW (ref 98–110)
Creat: 5.34 mg/dL — ABNORMAL HIGH (ref 0.70–1.18)
GFR, Est African American: 11 mL/min/{1.73_m2} — ABNORMAL LOW (ref >=60)
GFR, Est Non African American: 9 mL/min/{1.73_m2} — ABNORMAL LOW (ref >=60)
Globulin: 2.2 g/dL (calc) (ref 1.9–3.7)
Glucose, Bld: 117 mg/dL — ABNORMAL HIGH (ref 65–99)
Potassium: 3 mmol/L — ABNORMAL LOW (ref 3.5–5.3)
Sodium: 138 mmol/L (ref 135–146)
Total Bilirubin: 0.9 mg/dL (ref 0.2–1.2)
Total Protein: 5.4 g/dL — ABNORMAL LOW (ref 6.1–8.1)

## 2019-06-05 LAB — CBC WITH DIFFERENTIAL/PLATELET
Absolute Monocytes: 668 cells/uL (ref 200–950)
Basophils Absolute: 20 cells/uL (ref 0–200)
Basophils Relative: 0.5 %
Eosinophils Absolute: 72 cells/uL (ref 15–500)
Eosinophils Relative: 1.8 %
HCT: 21.7 % — ABNORMAL LOW (ref 38.5–50.0)
Hemoglobin: 6.9 g/dL — ABNORMAL LOW (ref 13.2–17.1)
Lymphs Abs: 224 cells/uL — ABNORMAL LOW (ref 850–3900)
MCH: 25.1 pg — ABNORMAL LOW (ref 27.0–33.0)
MCHC: 31.8 g/dL — ABNORMAL LOW (ref 32.0–36.0)
MCV: 78.9 fL — ABNORMAL LOW (ref 80.0–100.0)
MPV: 9.8 fL (ref 7.5–12.5)
Monocytes Relative: 16.7 %
Neutro Abs: 3016 cells/uL (ref 1500–7800)
Neutrophils Relative %: 75.4 %
Platelets: 198 10*3/uL (ref 140–400)
RBC: 2.75 10*6/uL — ABNORMAL LOW (ref 4.20–5.80)
RDW: 14 % (ref 11.0–15.0)
Total Lymphocyte: 5.6 %
WBC: 4 10*3/uL (ref 3.8–10.8)

## 2019-06-05 LAB — HIV ANTIBODY (ROUTINE TESTING W REFLEX): HIV 1&2 Ab, 4th Generation: NONREACTIVE

## 2019-06-05 LAB — CBC MORPHOLOGY

## 2019-06-06 ENCOUNTER — Inpatient Hospital Stay (HOSPITAL_COMMUNITY): Payer: Medicare Other

## 2019-06-06 ENCOUNTER — Other Ambulatory Visit: Payer: Self-pay

## 2019-06-06 ENCOUNTER — Inpatient Hospital Stay (HOSPITAL_BASED_OUTPATIENT_CLINIC_OR_DEPARTMENT_OTHER): Payer: Medicare Other | Admitting: Hematology

## 2019-06-06 ENCOUNTER — Encounter (HOSPITAL_COMMUNITY): Payer: Self-pay | Admitting: Emergency Medicine

## 2019-06-06 ENCOUNTER — Other Ambulatory Visit (HOSPITAL_COMMUNITY): Payer: Medicare Other

## 2019-06-06 ENCOUNTER — Ambulatory Visit (HOSPITAL_COMMUNITY): Payer: Medicare Other | Admitting: Hematology

## 2019-06-06 ENCOUNTER — Inpatient Hospital Stay (HOSPITAL_COMMUNITY)
Admission: EM | Admit: 2019-06-06 | Discharge: 2019-06-08 | DRG: 811 | Disposition: A | Discharge status: Home / Self Care | Payer: Medicare Other | Attending: Internal Medicine | Admitting: Internal Medicine

## 2019-06-06 ENCOUNTER — Ambulatory Visit (HOSPITAL_COMMUNITY): Payer: Medicare Other

## 2019-06-06 DIAGNOSIS — I509 Heart failure, unspecified: Secondary | ICD-10-CM | POA: Diagnosis present

## 2019-06-06 DIAGNOSIS — Z227 Latent tuberculosis: Secondary | ICD-10-CM

## 2019-06-06 DIAGNOSIS — N186 End stage renal disease: Secondary | ICD-10-CM | POA: Diagnosis present

## 2019-06-06 DIAGNOSIS — Z20828 Contact with and (suspected) exposure to other viral communicable diseases: Secondary | ICD-10-CM | POA: Diagnosis present

## 2019-06-06 DIAGNOSIS — C787 Secondary malignant neoplasm of liver and intrahepatic bile duct: Secondary | ICD-10-CM | POA: Diagnosis present

## 2019-06-06 DIAGNOSIS — Z85528 Personal history of other malignant neoplasm of kidney: Secondary | ICD-10-CM

## 2019-06-06 DIAGNOSIS — D649 Anemia, unspecified: Secondary | ICD-10-CM | POA: Diagnosis not present

## 2019-06-06 DIAGNOSIS — D7589 Other specified diseases of blood and blood-forming organs: Secondary | ICD-10-CM | POA: Diagnosis present

## 2019-06-06 DIAGNOSIS — C7A098 Malignant carcinoid tumors of other sites: Secondary | ICD-10-CM | POA: Diagnosis not present

## 2019-06-06 DIAGNOSIS — C786 Secondary malignant neoplasm of retroperitoneum and peritoneum: Secondary | ICD-10-CM | POA: Diagnosis not present

## 2019-06-06 DIAGNOSIS — T451X5A Adverse effect of antineoplastic and immunosuppressive drugs, initial encounter: Secondary | ICD-10-CM | POA: Diagnosis present

## 2019-06-06 DIAGNOSIS — E876 Hypokalemia: Secondary | ICD-10-CM | POA: Diagnosis present

## 2019-06-06 DIAGNOSIS — C179 Malignant neoplasm of small intestine, unspecified: Secondary | ICD-10-CM

## 2019-06-06 DIAGNOSIS — Z923 Personal history of irradiation: Secondary | ICD-10-CM

## 2019-06-06 DIAGNOSIS — I132 Hypertensive heart and chronic kidney disease with heart failure and with stage 5 chronic kidney disease, or end stage renal disease: Secondary | ICD-10-CM | POA: Diagnosis not present

## 2019-06-06 DIAGNOSIS — R531 Weakness: Secondary | ICD-10-CM | POA: Diagnosis not present

## 2019-06-06 DIAGNOSIS — D631 Anemia in chronic kidney disease: Secondary | ICD-10-CM

## 2019-06-06 DIAGNOSIS — D509 Iron deficiency anemia, unspecified: Secondary | ICD-10-CM | POA: Diagnosis present

## 2019-06-06 DIAGNOSIS — Z79899 Other long term (current) drug therapy: Secondary | ICD-10-CM

## 2019-06-06 DIAGNOSIS — Z8546 Personal history of malignant neoplasm of prostate: Secondary | ICD-10-CM

## 2019-06-06 DIAGNOSIS — N189 Chronic kidney disease, unspecified: Secondary | ICD-10-CM

## 2019-06-06 DIAGNOSIS — Z8249 Family history of ischemic heart disease and other diseases of the circulatory system: Secondary | ICD-10-CM

## 2019-06-06 DIAGNOSIS — Z992 Dependence on renal dialysis: Secondary | ICD-10-CM

## 2019-06-06 DIAGNOSIS — Z905 Acquired absence of kidney: Secondary | ICD-10-CM

## 2019-06-06 DIAGNOSIS — D6481 Anemia due to antineoplastic chemotherapy: Principal | ICD-10-CM | POA: Diagnosis present

## 2019-06-06 DIAGNOSIS — E785 Hyperlipidemia, unspecified: Secondary | ICD-10-CM | POA: Diagnosis present

## 2019-06-06 DIAGNOSIS — Z8615 Personal history of latent tuberculosis infection: Secondary | ICD-10-CM

## 2019-06-06 LAB — FERRITIN: Ferritin: 767 ng/mL — ABNORMAL HIGH (ref 24–336)

## 2019-06-06 LAB — FOLATE: Folate: 6.5 ng/mL (ref >5.9)

## 2019-06-06 LAB — BASIC METABOLIC PANEL
Anion gap: 13 (ref 5–15)
BUN: 29 mg/dL — ABNORMAL HIGH (ref 8–23)
CO2: 29 mmol/L (ref 22–32)
Calcium: 7.3 mg/dL — ABNORMAL LOW (ref 8.9–10.3)
Chloride: 95 mmol/L — ABNORMAL LOW (ref 98–111)
Creatinine, Ser: 5.59 mg/dL — ABNORMAL HIGH (ref 0.61–1.24)
GFR calc Af Amer: 10 mL/min — ABNORMAL LOW (ref >60)
GFR calc non Af Amer: 9 mL/min — ABNORMAL LOW (ref >60)
Glucose, Bld: 131 mg/dL — ABNORMAL HIGH (ref 70–99)
Potassium: 3.1 mmol/L — ABNORMAL LOW (ref 3.5–5.1)
Sodium: 137 mmol/L (ref 135–145)

## 2019-06-06 LAB — IRON AND TIBC
Iron: 15 ug/dL — ABNORMAL LOW (ref 45–182)
Saturation Ratios: 9 % — ABNORMAL LOW (ref 17.9–39.5)
TIBC: 175 ug/dL — ABNORMAL LOW (ref 250–450)
UIBC: 160 ug/dL

## 2019-06-06 LAB — CBC
HCT: 18.8 % — ABNORMAL LOW (ref 39.0–52.0)
Hemoglobin: 5.7 g/dL — CL (ref 13.0–17.0)
MCH: 25.4 pg — ABNORMAL LOW (ref 26.0–34.0)
MCHC: 30.3 g/dL (ref 30.0–36.0)
MCV: 83.9 fL (ref 80.0–100.0)
Platelets: 162 10*3/uL (ref 150–400)
RBC: 2.24 MIL/uL — ABNORMAL LOW (ref 4.22–5.81)
RDW: 14.6 % (ref 11.5–15.5)
WBC: 3.5 10*3/uL — ABNORMAL LOW (ref 4.0–10.5)
nRBC: 0 % (ref 0.0–0.2)

## 2019-06-06 LAB — VITAMIN B12: Vitamin B-12: 558 pg/mL (ref 180–914)

## 2019-06-06 LAB — POC OCCULT BLOOD, ED: Fecal Occult Bld: NEGATIVE

## 2019-06-06 LAB — RETICULOCYTES
Immature Retic Fract: 14 % (ref 2.3–15.9)
RBC.: 2.29 MIL/uL — ABNORMAL LOW (ref 4.22–5.81)
Retic Count, Absolute: 68.7 10*3/uL (ref 19.0–186.0)
Retic Ct Pct: 3 % (ref 0.4–3.1)

## 2019-06-06 LAB — PREPARE RBC (CROSSMATCH)

## 2019-06-06 LAB — ABO/RH: ABO/RH(D): O POS

## 2019-06-06 LAB — SARS CORONAVIRUS 2 (TAT 6-24 HRS): SARS Coronavirus 2: NEGATIVE

## 2019-06-06 MED ORDER — ONDANSETRON HCL 4 MG/2ML IJ SOLN: 4.0000 mg | Freq: Four times a day (QID) | INTRAMUSCULAR | Status: DC | PRN

## 2019-06-06 MED ORDER — POTASSIUM CHLORIDE CRYS ER 20 MEQ PO TBCR
40.0000 meq | EXTENDED_RELEASE_TABLET | ORAL | Status: DC
  Administered 2019-06-06: 40 meq via ORAL
  Filled 2019-06-06: qty 2

## 2019-06-06 MED ORDER — ACETAMINOPHEN 325 MG PO TABS: 650.0000 mg | ORAL_TABLET | Freq: Four times a day (QID) | ORAL | Status: DC | PRN

## 2019-06-06 MED ORDER — OCTREOTIDE ACETATE 30 MG IM KIT
PACK | INTRAMUSCULAR | Status: AC
  Filled 2019-06-06: qty 1

## 2019-06-06 MED ORDER — ISONIAZID 300 MG PO TABS
300.0000 mg | ORAL_TABLET | Freq: Every day | ORAL | Status: DC
  Administered 2019-06-07 – 2019-06-08 (×2): 300 mg via ORAL
  Filled 2019-06-06 (×3): qty 1

## 2019-06-06 MED ORDER — TAMSULOSIN HCL 0.4 MG PO CAPS
0.4000 mg | ORAL_CAPSULE | Freq: Every day | ORAL | Status: DC
  Administered 2019-06-07 – 2019-06-08 (×2): 0.4 mg via ORAL
  Filled 2019-06-06 (×2): qty 1

## 2019-06-06 MED ORDER — CALCITRIOL 0.25 MCG PO CAPS
0.2500 ug | ORAL_CAPSULE | Freq: Every day | ORAL | Status: DC
  Administered 2019-06-07 – 2019-06-08 (×2): 0.25 ug via ORAL
  Filled 2019-06-06 (×2): qty 1

## 2019-06-06 MED ORDER — SODIUM CHLORIDE 0.9 % IV SOLN
10.0000 mL/h | Freq: Once | INTRAVENOUS | Status: AC
  Administered 2019-06-06: 13:00:00 10 mL/h via INTRAVENOUS

## 2019-06-06 MED ORDER — METOLAZONE 5 MG PO TABS
5.0000 mg | ORAL_TABLET | ORAL | Status: DC
  Administered 2019-06-07: 09:00:00 5 mg via ORAL
  Filled 2019-06-06: qty 1

## 2019-06-06 MED ORDER — ATORVASTATIN CALCIUM 40 MG PO TABS
80.0000 mg | ORAL_TABLET | Freq: Every day | ORAL | Status: DC
  Administered 2019-06-06 – 2019-06-07 (×2): 80 mg via ORAL
  Filled 2019-06-06 (×2): qty 2

## 2019-06-06 MED ORDER — POLYETHYLENE GLYCOL 3350 17 G PO PACK: 17.0000 g | PACK | Freq: Every day | ORAL | Status: DC | PRN

## 2019-06-06 MED ORDER — CARVEDILOL 3.125 MG PO TABS
3.1250 mg | ORAL_TABLET | Freq: Two times a day (BID) | ORAL | Status: DC
  Administered 2019-06-06 – 2019-06-08 (×4): 3.125 mg via ORAL
  Filled 2019-06-06 (×4): qty 1

## 2019-06-06 MED ORDER — FUROSEMIDE 10 MG/ML IJ SOLN
40.0000 mg | Freq: Once | INTRAMUSCULAR | Status: AC
  Administered 2019-06-06: 20:00:00 40 mg via INTRAVENOUS
  Filled 2019-06-06: qty 4

## 2019-06-06 MED ORDER — VITAMIN B-6 50 MG PO TABS
50.0000 mg | ORAL_TABLET | Freq: Every day | ORAL | Status: DC
  Administered 2019-06-07 – 2019-06-08 (×2): 50 mg via ORAL
  Filled 2019-06-06 (×2): qty 1

## 2019-06-06 MED ORDER — ACETAMINOPHEN 650 MG RE SUPP: 650.0000 mg | Freq: Four times a day (QID) | RECTAL | Status: DC | PRN

## 2019-06-06 MED ORDER — SODIUM BICARBONATE 650 MG PO TABS
650.0000 mg | ORAL_TABLET | Freq: Every day | ORAL | Status: DC
  Administered 2019-06-07 – 2019-06-08 (×2): 650 mg via ORAL
  Filled 2019-06-06 (×2): qty 1

## 2019-06-06 MED ORDER — ONDANSETRON HCL 4 MG PO TABS: 4.0000 mg | ORAL_TABLET | Freq: Four times a day (QID) | ORAL | Status: DC | PRN

## 2019-06-06 NOTE — ED Notes (Signed)
Attempted to call report, not available.

## 2019-06-06 NOTE — ED Notes (Signed)
Pt wife called and given update.

## 2019-06-06 NOTE — ED Triage Notes (Signed)
Pt sent from [facility] center for a low Hgb. Pt just started having dialysis 3 weeks ago

## 2019-06-06 NOTE — ED Provider Notes (Signed)
[facility] EMERGENCY DEPARTMENT Provider Note   CSN: [id number] Arrival date & time: 06/06/19  1040     History   Chief Complaint Chief Complaint  Patient presents with  . Abnormal Lab    HPI Allen Davenport is a 78 y.o. male.     Patient complains of weakness.  He has a hemoglobin of 5.7.  Patient is a dialysis patient and is an oncology patient.  The history is provided by the patient. No language interpreter was used.  Weakness Severity:  Moderate Onset quality:  Gradual Timing:  Constant Progression:  Waxing and waning Chronicity:  New Context: not alcohol use   Relieved by:  Nothing Worsened by:  Nothing Ineffective treatments:  None tried Associated symptoms: no abdominal pain, no chest pain, no cough, no diarrhea, no frequency, no headaches and no seizures     Past Medical History:  Diagnosis Date  . Anemia   . Cancer [facility])    right renal . Prostate- Radiation treatment  . CHF (congestive heart failure) ([place])   . Chronic kidney disease    stage 4  . Edema   . Heart murmur    "nothing to worry about"  . Hyperlipidemia   . Hypertension   . Malignant carcinoid tumor ([place]) 01/03/2016    Patient Active Problem List   Diagnosis Date Noted  . Latent tuberculosis by blood test 06/04/2019  . Malignant carcinoid tumor ([place]) 01/03/2016  . Postablative hypothyroidism 06/04/2015  . Anemia, chronic renal failure, stage 4 (severe) ([place]) 08/19/2014  . Iron deficiency anemia 08/19/2014  . Kidney disease with fluid retention 08/19/2014  . Malabsorption due to intolerance, not elsewhere classified 08/19/2014  . Renal carcinoma, right ([place]) 08/19/2014  . Prostate cancer ([place]) 05/13/2014  . Anemia in chronic renal disease 01/21/2014  . Renal cancer, right ([place]) 01/21/2014  . Vitamin B12 deficiency anemia due to malabsorption with proteinuria 01/21/2014  . Vitamin B12 deficiency anemia due to selective vitamin B12 malabsorption with proteinuria 01/21/2014  .  Carcinoid (except of appendix) 01/21/2014  . Head and neck cancer ([place]) 05/28/2013  . Heme + stool 05/28/2013  . Hiatal hernia 05/28/2013  . Hypercholesterolemia 05/28/2013  . Hypertension 05/28/2013  . Iron (Fe) deficiency anemia 05/28/2013  . Kidney carcinoma ([place]) 05/28/2013  . 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 AV FISTULA PLACEMENT Left 10/20/2014   Procedure: Left Arm ARTERIOVENOUS (AV) FISTULA CREATION;  Surgeon: Elam Dutch, MD;  Location: [facility];  Service: Vascular;  Laterality: Left;  . NEPHRECTOMY Right 2010        Home Medications    Prior to Admission medications   Medication Sig Start Date End Date Taking? Authorizing Provider  alendronate (FOSAMAX) 70 MG tablet TAKE ONE TABLET BY MOUTH ONCE A WEEK IN THE MORNING WITH A FULL GLASS OF WATER ON AN EMPTY STOMACH. DO NOT LAY DOWN FOR 30 MINUTES. 03/27/19  Yes [provider]  amLODipine (NORVASC) 10 MG tablet Take 10 mg by mouth daily.  08/18/14  Yes [provider]  atorvastatin (LIPITOR) 80 MG tablet Take 80 mg by mouth daily at 6 PM.  11/04/13  Yes [provider]  calcitRIOL (ROCALTROL) 0.25 MCG capsule Take 0.25 mcg by mouth daily.  04/24/16  Yes [provider]  carvedilol (COREG) 3.125 MG tablet Take 3.125 mg by mouth 2 (two) times daily with a meal.  08/18/14  Yes [provider]  Cholecalciferol (VITAMIN D3)  250 MCG (10000 UT) TABS Take by mouth.   Yes [provider]  dexamethasone (DECADRON) 0.5 MG/5ML solution SWISH TEN ML BY MOUTH FOUR TIMES DAILY FOR TWO MINUTES THEN SPIT 01/13/19  Yes Derek Jack, MD  epoetin alfa (EPOGEN) 4000 UNIT/ML injection Inject into the skin.   Yes [provider]  everolimus (AFINITOR) 5 MG tablet Take 1 tablet (5 mg total) by mouth daily. 04/29/19  Yes Roger Shelter, FNP  hydrALAZINE (APRESOLINE) 25 MG tablet TAKE THREE (3) TABLETS BY MOUTH TWICE DAILY 06/17/16   Yes [provider]  iron polysaccharides (NIFEREX) 150 MG capsule Take by mouth.   Yes [provider]  isoniazid (NYDRAZID) 300 MG tablet Take 1 tablet (300 mg total) by mouth daily. 06/04/19  Yes Comer, Okey Regal, MD  metolazone (ZAROXOLYN) 5 MG tablet Take 5 mg by mouth every other day. 10/31/18  Yes [provider]  octreotide (SANDOSTATIN LAR DEPOT) 30 MG injection Inject into the muscle. 08/18/14  Yes [provider]  octreotide (SANDOSTATIN LAR) 30 MG injection Inject into the muscle.   Yes [provider]  Omega-3 Fatty Acids (FISH OIL) 1000 MG CAPS Take 1 capsule by mouth daily.   Yes [provider]  pyridOXINE (B-6) 50 MG tablet Take 1 tablet (50 mg total) by mouth daily. 06/04/19  Yes Comer, Okey Regal, MD  sodium bicarbonate 650 MG tablet Take 650 mg by mouth daily.   Yes [provider]  tamsulosin (FLOMAX) 0.4 MG CAPS capsule Take 0.4 mg by mouth daily.  04/05/16  Yes [provider]    Family History Family History  Problem Relation Age of Onset  . Cancer Mother   . Hypertension Father   . Cancer Sister     Social History Social History   Tobacco Use  . Smoking status: Never Smoker  . Smokeless tobacco: Never Used  Substance Use Topics  . Alcohol use: No    Alcohol/week: 0.0 standard drinks  . Drug use: No     Allergies   Patient has no known allergies.   Review of Systems Review of Systems  Constitutional: Negative for appetite change and fatigue.  HENT: Negative for congestion, ear discharge and sinus pressure.   Eyes: Negative for discharge.  Respiratory: Negative for cough.   Cardiovascular: Negative for chest pain.  Gastrointestinal: Negative for abdominal pain and diarrhea.  Genitourinary: Negative for frequency and hematuria.  Musculoskeletal: Negative for back pain.  Skin: Negative for rash.  Neurological: Positive for weakness. Negative for seizures and headaches.   Psychiatric/Behavioral: Negative for hallucinations.     Physical Exam Updated Vital Signs BP 111/60   Pulse 73   Temp 98.7 F (37.1 C) (Oral)   Resp (!) 29   Ht 6\' 5"  (1.956 m)   Wt 86.2 kg   SpO2 99%   BMI 22.53 kg/m   Physical Exam Vitals signs and nursing note reviewed.  Constitutional:      Appearance: He is well-developed.  HENT:     Head: Normocephalic.     Nose: Nose normal.  Eyes:     General: No scleral icterus.    Conjunctiva/sclera: Conjunctivae normal.  Neck:     Musculoskeletal: Neck supple.     Thyroid: No thyromegaly.  Cardiovascular:     Rate and Rhythm: Normal rate and regular rhythm.     Heart sounds: No murmur. No friction rub. No gallop.   Pulmonary:     Breath sounds: No stridor. No wheezing  or rales.  Chest:     Chest wall: No tenderness.  Abdominal:     General: There is no distension.     Tenderness: There is no abdominal tenderness. There is no rebound.  Musculoskeletal: Normal range of motion.  Lymphadenopathy:     Cervical: No cervical adenopathy.  Skin:    Findings: No erythema or rash.  Neurological:     Mental Status: He is alert and oriented to person, place, and time.     Motor: No abnormal muscle tone.     Coordination: Coordination normal.  Psychiatric:        Behavior: Behavior normal.      ED Treatments / Results  Labs (all labs ordered are listed, but only abnormal results are displayed) Labs Reviewed  BASIC METABOLIC PANEL - Abnormal; Notable for the following components:      Result Value   Potassium 3.1 (*)    Chloride 95 (*)    Glucose, Bld 131 (*)    BUN 29 (*)    Creatinine, Ser 5.59 (*)    Calcium 7.3 (*)    GFR calc non Af Amer 9 (*)    GFR calc Af Amer 10 (*)    All other components within normal limits  IRON AND TIBC - Abnormal; Notable for the following components:   Iron 15 (*)    TIBC 175 (*)    Saturation Ratios 9 (*)    All other components within normal limits  FERRITIN - Abnormal;  Notable for the following components:   Ferritin 767 (*)    All other components within normal limits  RETICULOCYTES - Abnormal; Notable for the following components:   RBC. 2.29 (*)    All other components within normal limits  VITAMIN B12  FOLATE  OCCULT BLOOD X 1 CARD TO LAB, STOOL  POC OCCULT BLOOD, ED  PREPARE RBC (CROSSMATCH)  ABO/RH  TYPE AND SCREEN    EKG None  Radiology No results found.  Procedures Procedures (including critical care time)  Medications Ordered in ED Medications  0.9 %  sodium chloride infusion (10 mL/hr Intravenous New Bag/Given 06/06/19 1315)     Initial Impression / Assessment and Plan / ED Course  I have reviewed the triage vital signs and the nursing notes.  Pertinent labs & imaging results that were available during my care of the patient were reviewed by me and considered in my medical decision making (see chart for details).    CRITICAL CARE Performed by: Milton Ferguson Total critical care time:67minutes Critical care time was exclusive of separately billable procedures and treating other patients. Critical care was necessary to treat or prevent imminent or life-threatening deterioration. Critical care was time spent personally by me on the following activities: development of treatment plan with patient and/or surrogate as well as nursing, discussions with consultants, evaluation of patient's response to treatment, examination of patient, obtaining history from patient or surrogate, ordering and performing treatments and interventions, ordering and review of laboratory studies, ordering and review of radiographic studies, pulse oximetry and re-evaluation of patient's condition.      Patient is a dialysis patient and a history of anemia.  I spoke with oncology and they stated to admit him give him 2 units of blood and stop his cancer medicine which is contributing to his anemia  Final Clinical Impressions(s) / ED Diagnoses   Final  diagnoses:  Symptomatic anemia    ED Discharge Orders    None  Milton Ferguson, MD 06/09/19 [phone]

## 2019-06-06 NOTE — Progress Notes (Signed)
[address] [place], [place] [address]   CLINIC:  Medical Oncology/Hematology  PCP:  [name], MD [place] [phone]   REASON FOR VISIT:  Follow-up for carcinoid tumor  CURRENT THERAPY: Sandostatin and everolimus    INTERVAL HISTORY:  Allen Davenport 78 y.o. male presents today for follow-up.  He reports overall doing well.  He was started on dialysis last week.  He does report increasing fatigue.  He denies any changes in bowel habits.  No obvious signs of bleeding.  Appetite is stable.  No weight loss.  He continues to tolerate Sandostatin and everolimus well.  He is here for repeat labs and injection.  REVIEW OF SYSTEMS:  Review of Systems  Constitutional: Positive for fatigue.  HENT:  Negative.   Eyes: Negative.   Respiratory: Negative.   Cardiovascular: Negative.   Gastrointestinal: Negative.   Endocrine: Negative.   Genitourinary: Negative.    Musculoskeletal: Negative.   Skin: Negative.   Neurological: Negative.   Hematological: Negative.   Psychiatric/Behavioral: Negative.      PAST MEDICAL/SURGICAL HISTORY:  Past Medical History:  Diagnosis Date  . Anemia   . Cancer [facility])    right renal . Prostate- Radiation treatment  . CHF (congestive heart failure) ([place])   . Chronic kidney disease    stage 4  . Edema   . Heart murmur    "nothing to worry about"  . Hyperlipidemia   . Hypertension   . Malignant carcinoid tumor ([place]) 01/03/2016   Past Surgical History:  Procedure Laterality Date  . AV FISTULA PLACEMENT Left 10/20/2014   Procedure: Left Arm ARTERIOVENOUS (AV) FISTULA CREATION;  Surgeon: Elam Dutch, MD;  Location: [facility];  Service: Vascular;  Laterality: Left;  . NEPHRECTOMY Right 2010     SOCIAL HISTORY:  Social History   Socioeconomic History  . Marital status: Married    Spouse name: Not on file  . Number of children: Not on file  . Years of education: Not on file  .  Highest education level: Not on file  Occupational History  . Not on file  Social Needs  . Financial resource strain: Not on file  . Food insecurity    Worry: Not on file    Inability: Not on file  . Transportation needs    Medical: Not on file    Non-medical: Not on file  Tobacco Use  . Smoking status: Never Smoker  . Smokeless tobacco: Never Used  Substance and Sexual Activity  . Alcohol use: No    Alcohol/week: 0.0 standard drinks  . Drug use: No  . Sexual activity: Not on file  Lifestyle  . Physical activity    Days per week: Not on file    Minutes per session: Not on file  . Stress: Not on file  Relationships  . Social Herbalist on phone: Not on file    Gets together: Not on file    Attends religious service: Not on file    Active member of club or organization: Not on file    Attends meetings of clubs or organizations: Not on file    Relationship status: Not on file  . Intimate partner violence    Fear of current or ex partner: Not on file    Emotionally abused: Not on file    Physically abused: Not on file    Forced sexual activity: Not on file  Other Topics Concern  . Not on file  Social History Narrative  . Not on file    FAMILY HISTORY:  Family History  Problem Relation Age of Onset  . Cancer Mother   . Hypertension Father   . Cancer Sister     CURRENT MEDICATIONS:  No facility-administered encounter medications on file as of 06/06/2019.    Outpatient Encounter Medications as of 06/06/2019  Medication Sig Note  . alendronate (FOSAMAX) 70 MG tablet TAKE ONE TABLET BY MOUTH ONCE A WEEK IN THE MORNING WITH A FULL GLASS OF WATER ON AN EMPTY STOMACH. DO NOT LAY DOWN FOR 30 MINUTES.   [name] amLODipine (NORVASC) 10 MG tablet Take 10 mg by mouth daily.    [name] atorvastatin (LIPITOR) 80 MG tablet Take 80 mg by mouth daily at 6 PM.    . calcitRIOL (ROCALTROL) 0.25 MCG capsule Take 0.25 mcg by mouth daily.  04/26/2016: Received from: External Pharmacy  .  carvedilol (COREG) 3.125 MG tablet Take 3.125 mg by mouth 2 (two) times daily with a meal.    . Cholecalciferol (VITAMIN D3) 250 MCG (10000 UT) TABS Take by mouth.   . dexamethasone (DECADRON) 0.5 MG/5ML solution SWISH TEN ML BY MOUTH FOUR TIMES DAILY FOR TWO MINUTES THEN SPIT   . epoetin alfa (EPOGEN) 4000 UNIT/ML injection Inject into the skin.   [name] everolimus (AFINITOR) 5 MG tablet Take 1 tablet (5 mg total) by mouth daily.   . hydrALAZINE (APRESOLINE) 25 MG tablet TAKE THREE (3) TABLETS BY MOUTH TWICE DAILY 07/19/2016: Received from: External Pharmacy  . iron polysaccharides (NIFEREX) 150 MG capsule Take by mouth.   . isoniazid (NYDRAZID) 300 MG tablet Take 1 tablet (300 mg total) by mouth daily.   . metolazone (ZAROXOLYN) 5 MG tablet Take 5 mg by mouth every other day.   [name] octreotide (SANDOSTATIN LAR DEPOT) 30 MG injection Inject into the muscle.   [name] octreotide (SANDOSTATIN LAR) 30 MG injection Inject into the muscle.   . Omega-3 Fatty Acids (FISH OIL) 1000 MG CAPS Take 1 capsule by mouth daily.   [name] pyridOXINE (B-6) 50 MG tablet Take 1 tablet (50 mg total) by mouth daily.   . sodium bicarbonate 650 MG tablet Take 650 mg by mouth daily.   . tamsulosin (FLOMAX) 0.4 MG CAPS capsule Take 0.4 mg by mouth daily.  04/26/2016: Received from: External Pharmacy    ALLERGIES:  No Known Allergies   PHYSICAL EXAM:  ECOG Performance status: 1  Vitals:   06/06/19 0935  BP: (!) 90/54  Pulse: 78  Resp: 18  Temp: (!) 97.5 F (36.4 C)  SpO2: 100%   Filed Weights   06/06/19 0935  Weight: 190 lb 1.6 oz (86.2 kg)    Physical Exam Constitutional:      Appearance: Normal appearance.  HENT:     Head: Normocephalic.     Right Ear: External ear normal.     Left Ear: External ear normal.     Ears:     Comments: Thready pulse    Nose: Nose normal.     Mouth/Throat:     Pharynx: Oropharynx is clear.  Eyes:     Conjunctiva/sclera: Conjunctivae normal.  Neck:     Musculoskeletal: Normal range  of motion.  Cardiovascular:     Rate and Rhythm: Normal rate and regular rhythm.     Heart sounds: Normal heart sounds.  Pulmonary:     Effort: Pulmonary effort is normal.     Breath sounds:  Normal breath sounds.  Abdominal:     General: Bowel sounds are normal.  Musculoskeletal:     Comments: Decreased range of motion  Skin:    General: Skin is warm.  Neurological:     Mental Status: He is alert and oriented to person, place, and time.  Psychiatric:        Mood and Affect: Mood normal.        Behavior: Behavior normal.        Thought Content: Thought content normal.        Judgment: Judgment normal.      LABORATORY DATA:  I have reviewed the labs as listed.  CBC    Component Value Date/Time   WBC 3.5 (L) 06/06/2019 0910   RBC 2.29 (L) 06/06/2019 1149   RBC 2.24 (L) 06/06/2019 0910   HGB 5.7 (LL) 06/06/2019 0910   HCT 18.8 (L) 06/06/2019 0910   PLT 162 06/06/2019 0910   MCV 83.9 06/06/2019 0910   MCH 25.4 (L) 06/06/2019 0910   MCHC 30.3 06/06/2019 0910   RDW 14.6 06/06/2019 0910   LYMPHSABS 224 (L) 06/04/2019 1449   MONOABS 0.6 05/09/2019 1049   EOSABS 72 06/04/2019 1449   BASOSABS 20 06/04/2019 1449   CMP Latest Ref Rng & Units 06/06/2019 06/04/2019 05/09/2019  Glucose 70 - 99 mg/dL 131(H) 117(H) -  BUN 8 - 23 mg/dL 29(H) 28(H) -  Creatinine 0.61 - 1.24 mg/dL 5.59(H) 5.34(H) -  Sodium 135 - 145 mmol/L 137 138 -  Potassium 3.5 - 5.1 mmol/L 3.1(L) 3.0(L) 2.8(L)  Chloride 98 - 111 mmol/L 95(L) 96(L) -  CO2 22 - 32 mmol/L 29 30 -  Calcium 8.9 - 10.3 mg/dL 7.3(L) 7.3(L) -  Total Protein 6.1 - 8.1 g/dL - 5.4(L) -  Total Bilirubin 0.2 - 1.2 mg/dL - 0.9 -  Alkaline Phos 38 - 126 U/L - - -  AST 10 - 35 U/L - 60(H) -  ALT 9 - 46 U/L - 18 -           ASSESSMENT & PLAN:   Small bowel cancer (HCC) 1.  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  Initially managed at [facility] center.  Transferred  care to [facility] in October 2017. - Gallium-68 scan on 10/23/2017 shows metastatic disease in the liver, central right mesenteric mass and multiple peritoneal deposits. -Gallium-68 dotatate PET scan on 04/22/2018 showed multiple foci of intense radiotracer activity consistent with metastatic well-differentiated carcinoid tumor.  Metastatic sites include central mesentery, multiple peritoneal implants, liver metastasis.  Several metastatic lesions have measured higher activity.  Additional mild increase in size of central mesenteric mass.  Lesion within the right ventricle along the interventricular septum thought to be metastatic. - 2D echo on 05/03/2018 showed normal cavity size of the right ventricle with normal systolic function.  -Everolimus 5 mg daily started on 05/17/2018 along with dexamethasone mouthwash. -He denies any symptoms of carcinoid syndrome including flushing, wheezing or diarrhea. -Denies any abdominal pains.  He is tolerating everolimus very well. - Gallium-68 dotatate scan on 11/27/2018 shows overall stable well-differentiated neuroendocrine tumor metastasis at multiple sites in the liver, peritoneum and potentially heart.  Stable activity of liver lesions.  Decrease in size and activity of the right central mesenteric mass.  Decrease in activity of peritoneal implants. - Chromogranin A remains elevated but stable at 421.9 - Restaging gallium-68 dotatate scan on April 08, 2019 revealed no  new evidence of disease progression.  Overall his exam was stable compared to prior.  There was a new nodularity in the right lung base that was non-FDG avid. -Critical lab called today with a hemoglobin of 5.7.  Patient denies any obvious signs of bleeding.  Patient is relatively fatigue.  Sleeps the majority of the day.  He is hypotensive with a BP of 90/50, pulse is thready.  Due to critical nature of patient's blood have recommended patient be transferred to the emergency room for evaluation and  treatment.  Also recommend that the patient hold everolimus.  We will also hold Sandostatin injection today.  2.  Normocytic anemia: -Combination anemia from CKD, iron deficiency and bone marrow suppression from everolimus. -He was started on Procrit 20,000 units every 2 weeks on 09/26/2018.       [place] [phone]

## 2019-06-06 NOTE — Progress Notes (Signed)
CRITICAL VALUE ALERT  Critical Value:  HGB 5.7  Date & Time Notied:  06/06/19 09:27  Provider Notified: RNester NP   Orders Received/Actions taken:

## 2019-06-06 NOTE — Assessment & Plan Note (Signed)
1.  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  Initially managed at [facility] center.  Transferred care to [facility] in October 2017. - Gallium-68 scan on 10/23/2017 shows metastatic disease in the liver, central right mesenteric mass and multiple peritoneal deposits. -Gallium-68 dotatate PET scan on 04/22/2018 showed multiple foci of intense radiotracer activity consistent with metastatic well-differentiated carcinoid tumor.  Metastatic sites include central mesentery, multiple peritoneal implants, liver metastasis.  Several metastatic lesions have measured higher activity.  Additional mild increase in size of central mesenteric mass.  Lesion within the right ventricle along the interventricular septum thought to be metastatic. - 2D echo on 05/03/2018 showed normal cavity size of the right ventricle with normal systolic function.  -Everolimus 5 mg daily started on 05/17/2018 along with dexamethasone mouthwash. -He denies any symptoms of carcinoid syndrome including flushing, wheezing or diarrhea. -Denies any abdominal pains.  He is tolerating everolimus very well. - Gallium-68 dotatate scan on 11/27/2018 shows overall stable well-differentiated neuroendocrine tumor metastasis at multiple sites in the liver, peritoneum and potentially heart.  Stable activity of liver lesions.  Decrease in size and activity of the right central mesenteric mass.  Decrease in activity of peritoneal implants. - Chromogranin A remains elevated but stable at 421.9 - Restaging gallium-68 dotatate scan on April 08, 2019 revealed no new evidence of disease progression.  Overall his exam was stable compared to prior.  There was a new nodularity in the right lung base that was non-FDG avid. -Critical lab called today with a hemoglobin of 5.7.  Patient denies any obvious signs of bleeding.  Patient is relatively fatigue.  Sleeps the majority of the day.  He is hypotensive  with a BP of 90/50, pulse is thready.  Due to critical nature of patient's blood have recommended patient be transferred to the emergency room for evaluation and treatment.  Also recommend that the patient hold everolimus.  We will also hold Sandostatin injection today.  2.  Normocytic anemia: -Combination anemia from CKD, iron deficiency and bone marrow suppression from everolimus. -He was started on Procrit 20,000 units every 2 weeks on 09/26/2018.

## 2019-06-06 NOTE — Progress Notes (Signed)
No injection today per NP.

## 2019-06-06 NOTE — H&P (Signed)
History and Physical    Allen Davenport ERX:[id number] DOB: [birth year] DOA: 06/06/2019  PCP: [name], MD   Patient coming from: Home  I have personally briefly reviewed patient's old medical records in [place]  Chief Complaint: Low hemoglobin  HPI: Allen Davenport is a 78 y.o. male with medical history significant for Carcinoid tumor, head and neck cancer, Prostate cancer, HTN, iron deficiency, ESRD-started 3 weeks ago, latent TB.   Patient saw his oncologist up of his carcinoid tumor, blood work showed hemoglobin of 5.7.  Patient was subsequently sent to the ED.  Reports generalized weakness over the past several days.  No chest pain, no difficulty breathing at rest or with exertion.  Reports chronic lower extremity swelling that has improved compared to prior since he was placed on a fluid pill.  He denies NSAID use.  No abdominal pain.  No black stools.  No vomiting of blood.  ED Course: Mild tachypnea, O2 sats greater than 94% on room air. 2 u PRBC transfused in ED. EDP talked to Dr. Delton Coombes, recommended admission for transfusion, and to hold Everolimus as this is known to cause bone marrow suppression.  Admitting team to reconsult if needed.  Review of Systems: As per HPI all other systems reviewed and negative.  Past Medical History:  Diagnosis Date  . Anemia   . Cancer [facility])    right renal . Prostate- Radiation treatment  . CHF (congestive heart failure) ([place])   . Chronic kidney disease    stage 4  . Edema   . Heart murmur    "nothing to worry about"  . Hyperlipidemia   . Hypertension   . Malignant carcinoid tumor ([place]) 01/03/2016    Past Surgical History:  Procedure Laterality Date  . AV FISTULA PLACEMENT Left 10/20/2014   Procedure: Left Arm ARTERIOVENOUS (AV) FISTULA CREATION;  Surgeon: Elam Dutch, MD;  Location: [place];  Service: Vascular;  Laterality: Left;  . NEPHRECTOMY Right 2010     reports that he has never smoked. He has never used  smokeless tobacco. He reports that he does not drink alcohol or use drugs.  No Known Allergies  Family History  Problem Relation Age of Onset  . Cancer Mother   . Hypertension Father   . Cancer Sister     Prior to Admission medications   Medication Sig Start Date End Date Taking? Authorizing Provider  alendronate (FOSAMAX) 70 MG tablet TAKE ONE TABLET BY MOUTH ONCE A WEEK IN THE MORNING WITH A FULL GLASS OF WATER ON AN EMPTY STOMACH. DO NOT LAY DOWN FOR 30 MINUTES. 03/27/19  Yes [provider]  amLODipine (NORVASC) 10 MG tablet Take 10 mg by mouth daily.  08/18/14  Yes [provider]  atorvastatin (LIPITOR) 80 MG tablet Take 80 mg by mouth daily at 6 PM.  11/04/13  Yes [provider]  calcitRIOL (ROCALTROL) 0.25 MCG capsule Take 0.25 mcg by mouth daily.  04/24/16  Yes [provider]  carvedilol (COREG) 3.125 MG tablet Take 3.125 mg by mouth 2 (two) times daily with a meal.  08/18/14  Yes [provider]  Cholecalciferol (VITAMIN D3) 250 MCG (10000 UT) TABS Take by mouth.   Yes [provider]  dexamethasone (DECADRON) 0.5 MG/5ML solution SWISH TEN ML BY MOUTH FOUR TIMES DAILY FOR TWO MINUTES THEN SPIT 01/13/19  Yes Derek Jack, MD  epoetin alfa (EPOGEN) 4000 UNIT/ML injection Inject into the skin.   Yes [provider]  everolimus (AFINITOR) 5 MG tablet Take 1 tablet (5 mg total) by mouth daily. 04/29/19  Yes Roger Shelter, FNP  hydrALAZINE (APRESOLINE) 25 MG tablet TAKE THREE (3) TABLETS BY MOUTH TWICE DAILY 06/17/16  Yes [provider]  iron polysaccharides (NIFEREX) 150 MG capsule Take by mouth.   Yes [provider]  isoniazid (NYDRAZID) 300 MG tablet Take 1 tablet (300 mg total) by mouth daily. 06/04/19  Yes Comer, Okey Regal, MD  metolazone (ZAROXOLYN) 5 MG tablet Take 5 mg by mouth every other day. 10/31/18  Yes [provider]  octreotide (SANDOSTATIN LAR DEPOT) 30 MG injection Inject into  the muscle. 08/18/14  Yes [provider]  octreotide (SANDOSTATIN LAR) 30 MG injection Inject into the muscle.   Yes [provider]  Omega-3 Fatty Acids (FISH OIL) 1000 MG CAPS Take 1 capsule by mouth daily.   Yes [provider]  pyridOXINE (B-6) 50 MG tablet Take 1 tablet (50 mg total) by mouth daily. 06/04/19  Yes Comer, Okey Regal, MD  sodium bicarbonate 650 MG tablet Take 650 mg by mouth daily.   Yes [provider]  tamsulosin (FLOMAX) 0.4 MG CAPS capsule Take 0.4 mg by mouth daily.  04/05/16  Yes [provider]    Physical Exam: Vitals:   06/06/19 1730 06/06/19 1800 06/06/19 1830 06/06/19 1900  BP: (!) 139/97 122/63 123/63 131/60  Pulse: 75 70 74 76  Resp: 18 (!) 24 (!) 27 (!) 23  Temp:      TempSrc:      SpO2: 95% 96% 95% 96%  Weight:      Height:        Constitutional: NAD, calm, comfortable, lying almost flat in bed Vitals:   06/06/19 1730 06/06/19 1800 06/06/19 1830 06/06/19 1900  BP: (!) 139/97 122/63 123/63 131/60  Pulse: 75 70 74 76  Resp: 18 (!) 24 (!) 27 (!) 23  Temp:      TempSrc:      SpO2: 95% 96% 95% 96%  Weight:      Height:       Eyes: PERRL, lids and conjunctivae normal ENMT: Mucous membranes are moist. Posterior pharynx clear of any exudate or lesions  Neck: normal, supple, no masses, no thyromegaly Respiratory: clear to auscultation bilaterally, no wheezing, no crackles. Normal respiratory effort. No accessory muscle use.  Cardiovascular: Regular rate and rhythm, 2/6 systolic murmurs- old , no rubs / gallops.  At least 2+ pitting bilateral extremity edema. 2+ pedal pulses. Abdomen: no tenderness, no masses palpated. No hepatosplenomegaly. Bowel sounds positive.  Musculoskeletal: no clubbing / cyanosis. No joint deformity upper and lower extremities. Good ROM, no contractures. Normal muscle tone.  Skin: no rashes, lesions, ulcers. No induration Neurologic: CN 2-12 grossly intact.  Strength 5/5 in all 4.   Psychiatric: Normal judgment and insight. Alert and oriented x 3. Normal mood.   Labs on Admission: I have personally reviewed following labs and imaging studies  CBC: Recent Labs  Lab 06/04/19 1449 06/06/19 0910  WBC 4.0 3.5*  NEUTROABS 3,016  --   HGB 6.9* 5.7*  HCT 21.7* 18.8*  MCV 78.9* 83.9  PLT 198 353   Basic Metabolic Panel: Recent Labs  Lab 06/04/19 1449 06/06/19 1148  NA 138 137  K 3.0* 3.1*  CL 96* 95*  CO2 30 29  GLUCOSE 117* 131*  BUN 28* 29*  CREATININE 5.34* 5.59*  CALCIUM 7.3* 7.3*   Liver Function Tests: Recent Labs  Lab 06/04/19 1449  AST 60*  ALT 18  BILITOT 0.9  PROT 5.4*   Anemia Panel: Recent Labs    06/06/19 1149  VITAMINB12 558  FOLATE 6.5  FERRITIN 767*  TIBC 175*  IRON 15*  RETICCTPCT 3.0    EKG: None.  Assessment/Plan Active Problems:   Acute anemia   Acute symptomatic anemia-hemoglobin 5.7, gradual downtrend from ~ 10  6 weeks ago.  Stool FOBT negative, denies melena.  Follows with oncology for normocytic anemia, thought multifactorial- ESRD, bone marrow suppression from chemotherapy and iron deficiency.  Vitals stable.  On Depo injections. -2 units PRBC transfused in the ED -CBC a.m. - EDP talked to Dr. Delton Coombes, hold home everolimus known to cause bone marrow suppression, reconsult if needed. -P.o. Protonix 40 mg daily -IV Lasix 40 mg x 1  ESRD-  per notes, started HD about a week ago. -Will confirm HD schedule -Resume home metolazone q48hrs  Hypokalemia-potassium 3.1 -Cautiously replete with ESRD - Check mag -Resume home calcitriol,  HTN- Soft.  -Resume home Coreg, will hold home Norvasc and hydralazine for now.    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  Currently on chemotherapy with with daily everolimus.  Also on monthly octreotide injections. -Hold home everolimus with acute anemia.  History of kidney cancer-status post right nephrectomy in May 2010 at  [facility].  History of prostate cancer-status post seed implant by Dr. Tammi Klippel in 2016.  Follows with his urologist. - Resume home tamsulosin  History of head and neck cancer-status post surgery and radiation therapy in 2009 he is in remission.  Latent TB- recently started on isoniazid and B6 to complete 5-month course by Dr. Linus Salmons. Had a Negative chest xray.  -Resume home isoniazid and B6   DVT prophylaxis: SCds Code Status: Full code Family Communication: None at bedside Disposition Plan: 1 to 2 days Consults called: None Admission status: Obs, telemetry.   Bethena Roys MD [facility]  06/06/2019, 8:41 PM

## 2019-06-06 NOTE — ED Notes (Signed)
ED TO INPATIENT HANDOFF REPORT  ED Nurse Name and Phone #: [phone]  S Name/Age/Gender Allen Davenport 78 y.o. male Room/Bed: APA07/APA07  Code Status   Code Status: Not on file  Home/SNF/Other Home Patient oriented to: self, place, time and situation Is this baseline? Yes   Triage Complete: Triage complete  Chief Complaint Hypotension  Triage Note Pt sent from [facility] center for a low Hgb. Pt just started having dialysis 3 weeks ago    Allergies No Known Allergies  Level of Care/Admitting Diagnosis ED Disposition    ED Disposition Condition [place]: [facility] [[id number]]  Level of Care: Telemetry [5]  Covid Evaluation: Asymptomatic Screening Protocol (No Symptoms)  Diagnosis: Acute anemia [2831517]  Admitting Physician: Bethena Roys [id number]  Attending Physician: Bethena Roys Nessa.Cuff  PT Class (Do Not Modify): Observation [104]  PT Acc Code (Do Not Modify): Observation [10022]       B Medical/Surgery History Past Medical History:  Diagnosis Date  . Anemia   . Cancer [facility])    right renal . Prostate- Radiation treatment  . CHF (congestive heart failure) ([place])   . Chronic kidney disease    stage 4  . Edema   . Heart murmur    "nothing to worry about"  . Hyperlipidemia   . Hypertension   . Malignant carcinoid tumor ([place]) 01/03/2016   Past Surgical History:  Procedure Laterality Date  . AV FISTULA PLACEMENT Left 10/20/2014   Procedure: Left Arm ARTERIOVENOUS (AV) FISTULA CREATION;  Surgeon: Elam Dutch, MD;  Location: [place];  Service: Vascular;  Laterality: Left;  . NEPHRECTOMY Right 2010     A IV Location/Drains/Wounds Patient Lines/Drains/Airways Status   Active Line/Drains/Airways    Name:   Placement date:   Placement time:   Site:   Days:   Peripheral IV 06/06/19 Right Hand   06/06/19    1136    Hand   less than 1   Fistula / Graft Left Forearm Arteriovenous fistula   10/20/14    1530    Forearm    1690   Incision (Closed) 10/20/14 Arm Left   10/20/14    1530     1690          Intake/Output Last 24 hours  Intake/Output Summary (Last 24 hours) at 06/06/2019 1540 Last data filed at 06/06/2019 1308 Gross per 24 hour  Intake 315 ml  Output -  Net 315 ml    Labs/Imaging Results for orders placed or performed during the hospital encounter of 06/06/19 (from the past 48 hour(s))  POC occult blood, ED     Status: None   Collection Time: 06/06/19 11:35 AM  Result Value Ref Range   Fecal Occult Bld NEGATIVE NEGATIVE  Type and screen Ordered by PROVIDER DEFAULT     Status: None (Preliminary result)   Collection Time: 06/06/19 11:40 AM  Result Value Ref Range   ABO/RH(D) O POS    Antibody Screen NEG    Sample Expiration 06/09/2019,2359    Unit Number [id number]    Blood Component Type RED CELLS,LR    Unit division 00    Status of Unit ALLOCATED    Transfusion Status OK TO TRANSFUSE    Crossmatch Result Compatible    Unit Number [id number]    Blood Component Type RED CELLS,LR    Unit division 00    Status of Unit ISSUED    Transfusion Status OK TO TRANSFUSE  Crossmatch Result      Compatible Performed at [facility], [address] [place], [place] [address]   Basic metabolic panel     Status: Abnormal   Collection Time: 06/06/19 11:48 AM  Result Value Ref Range   Sodium 137 135 - 145 mmol/L   Potassium 3.1 (L) 3.5 - 5.1 mmol/L   Chloride 95 (L) 98 - 111 mmol/L   CO2 29 22 - 32 mmol/L   Glucose, Bld 131 (H) 70 - 99 mg/dL   BUN 29 (H) 8 - 23 mg/dL   Creatinine, Ser 5.59 (H) 0.61 - 1.24 mg/dL   Calcium 7.3 (L) 8.9 - 10.3 mg/dL   GFR calc non Af Amer 9 (L) >60 mL/min   GFR calc Af Amer 10 (L) >60 mL/min   Anion gap 13 5 - 15    Comment: Performed at [facility], [address], [place], [place] [address]  Prepare RBC     Status: None   Collection Time: 06/06/19 11:48 AM  Result Value Ref Range   Order Confirmation      ORDER PROCESSED BY BLOOD  BANK Performed at [facility], [address], [place], [place] [address]   ABO/Rh     Status: None   Collection Time: 06/06/19 11:48 AM  Result Value Ref Range   ABO/RH(D)      O POS Performed at [facility], [address] [place], [place] [address]   Vitamin B12     Status: None   Collection Time: 06/06/19 11:49 AM  Result Value Ref Range   Vitamin B-12 558 180 - 914 pg/mL    Comment: (NOTE) This assay is not validated for testing neonatal or myeloproliferative syndrome specimens for Vitamin B12 levels. Performed at [facility], [address], [place], [place] [address]   Folate     Status: None   Collection Time: 06/06/19 11:49 AM  Result Value Ref Range   Folate 6.5 >5.9 ng/mL    Comment: Performed at [facility], [address] [place], [place] [address]  Iron and TIBC     Status: Abnormal   Collection Time: 06/06/19 11:49 AM  Result Value Ref Range   Iron 15 (L) 45 - 182 ug/dL   TIBC 175 (L) 250 - 450 ug/dL   Saturation Ratios 9 (L) 17.9 - 39.5 %   UIBC 160 ug/dL    Comment: Performed at [facility], [address] [place], [place] [address]  Ferritin     Status: Abnormal   Collection Time: 06/06/19 11:49 AM  Result Value Ref Range   Ferritin 767 (H) 24 - 336 ng/mL    Comment: Performed at [facility], [address], [place], [place] [address]  Reticulocytes     Status: Abnormal   Collection Time: 06/06/19 11:49 AM  Result Value Ref Range   Retic Ct Pct 3.0 0.4 - 3.1 %   RBC. 2.29 (L) 4.22 - 5.81 MIL/uL   Retic Count, Absolute 68.7 19.0 - 186.0 K/uL   Immature Retic Fract 14.0 2.3 - 15.9 %    Comment: Performed at [facility], [address], [place],  [address]   No results found.  Pending Labs Unresulted Labs (From admission, onward)    Start     Ordered   06/06/19 1538  SARS CORONAVIRUS 2 (TAT 6-24 HRS) Nasopharyngeal Nasopharyngeal Swab  (Asymptomatic/Tier 3)  Once,   STAT    Question Answer Comment  Is this test for diagnosis or screening  Screening  Symptomatic for COVID-19 as defined by CDC No   Hospitalized for COVID-19 No   Admitted to ICU for COVID-19 No   Previously tested for COVID-19 No   Resident in a congregate (group) care setting No   Employed in healthcare setting No      06/06/19 1537   06/06/19 1112  Occult blood card to lab, stool Provider will collect  Once,   STAT    Question:  Specimen to be collected by:  Answer:  Provider will collect   06/06/19 1111          Vitals/Pain Today's Vitals   06/06/19 1445 06/06/19 1500 06/06/19 1515 06/06/19 1530  BP: 111/60 110/63 108/66 (!) 109/57  Pulse: 73 71 72 71  Resp: (!) 29 (!) 24 (!) 28 (!) 21  Temp:      TempSrc:      SpO2: 99% 95% 94% 97%  Weight:      Height:      PainSc:        Isolation Precautions No active isolations  Medications Medications  0.9 %  sodium chloride infusion (10 mL/hr Intravenous New Bag/Given 06/06/19 1315)    Mobility walks Low fall risk   Focused Assessments    R Recommendations: See Admitting Provider Note  Report given to:   Additional Notes:

## 2019-06-06 NOTE — ED Notes (Signed)
Called Dr. Delton Coombes for Dr. Roderic Palau.

## 2019-06-07 DIAGNOSIS — Z85528 Personal history of other malignant neoplasm of kidney: Secondary | ICD-10-CM | POA: Diagnosis not present

## 2019-06-07 DIAGNOSIS — Z227 Latent tuberculosis: Secondary | ICD-10-CM | POA: Diagnosis not present

## 2019-06-07 DIAGNOSIS — C787 Secondary malignant neoplasm of liver and intrahepatic bile duct: Secondary | ICD-10-CM | POA: Diagnosis present

## 2019-06-07 DIAGNOSIS — D6481 Anemia due to antineoplastic chemotherapy: Secondary | ICD-10-CM | POA: Diagnosis present

## 2019-06-07 DIAGNOSIS — C786 Secondary malignant neoplasm of retroperitoneum and peritoneum: Secondary | ICD-10-CM | POA: Diagnosis present

## 2019-06-07 DIAGNOSIS — D649 Anemia, unspecified: Secondary | ICD-10-CM | POA: Diagnosis not present

## 2019-06-07 DIAGNOSIS — Z992 Dependence on renal dialysis: Secondary | ICD-10-CM | POA: Diagnosis not present

## 2019-06-07 DIAGNOSIS — I132 Hypertensive heart and chronic kidney disease with heart failure and with stage 5 chronic kidney disease, or end stage renal disease: Secondary | ICD-10-CM | POA: Diagnosis present

## 2019-06-07 DIAGNOSIS — Z8546 Personal history of malignant neoplasm of prostate: Secondary | ICD-10-CM | POA: Diagnosis not present

## 2019-06-07 DIAGNOSIS — I509 Heart failure, unspecified: Secondary | ICD-10-CM | POA: Diagnosis present

## 2019-06-07 DIAGNOSIS — D509 Iron deficiency anemia, unspecified: Secondary | ICD-10-CM | POA: Diagnosis present

## 2019-06-07 DIAGNOSIS — Z8615 Personal history of latent tuberculosis infection: Secondary | ICD-10-CM | POA: Diagnosis not present

## 2019-06-07 DIAGNOSIS — C7A098 Malignant carcinoid tumors of other sites: Secondary | ICD-10-CM | POA: Diagnosis present

## 2019-06-07 DIAGNOSIS — Z8249 Family history of ischemic heart disease and other diseases of the circulatory system: Secondary | ICD-10-CM | POA: Diagnosis not present

## 2019-06-07 DIAGNOSIS — Z20828 Contact with and (suspected) exposure to other viral communicable diseases: Secondary | ICD-10-CM | POA: Diagnosis present

## 2019-06-07 DIAGNOSIS — D7589 Other specified diseases of blood and blood-forming organs: Secondary | ICD-10-CM | POA: Diagnosis present

## 2019-06-07 DIAGNOSIS — Z905 Acquired absence of kidney: Secondary | ICD-10-CM | POA: Diagnosis not present

## 2019-06-07 DIAGNOSIS — E876 Hypokalemia: Secondary | ICD-10-CM | POA: Diagnosis present

## 2019-06-07 DIAGNOSIS — T451X5A Adverse effect of antineoplastic and immunosuppressive drugs, initial encounter: Secondary | ICD-10-CM | POA: Diagnosis present

## 2019-06-07 DIAGNOSIS — Z923 Personal history of irradiation: Secondary | ICD-10-CM | POA: Diagnosis not present

## 2019-06-07 DIAGNOSIS — N186 End stage renal disease: Secondary | ICD-10-CM | POA: Diagnosis present

## 2019-06-07 DIAGNOSIS — E785 Hyperlipidemia, unspecified: Secondary | ICD-10-CM | POA: Diagnosis present

## 2019-06-07 DIAGNOSIS — Z79899 Other long term (current) drug therapy: Secondary | ICD-10-CM | POA: Diagnosis not present

## 2019-06-07 LAB — BASIC METABOLIC PANEL
Anion gap: 11 (ref 5–15)
BUN: 34 mg/dL — ABNORMAL HIGH (ref 8–23)
CO2: 28 mmol/L (ref 22–32)
Calcium: 7 mg/dL — ABNORMAL LOW (ref 8.9–10.3)
Chloride: 98 mmol/L (ref 98–111)
Creatinine, Ser: 6.32 mg/dL — ABNORMAL HIGH (ref 0.61–1.24)
GFR calc Af Amer: 9 mL/min — ABNORMAL LOW (ref >60)
GFR calc non Af Amer: 8 mL/min — ABNORMAL LOW (ref >60)
Glucose, Bld: 133 mg/dL — ABNORMAL HIGH (ref 70–99)
Potassium: 2.7 mmol/L — CL (ref 3.5–5.1)
Sodium: 137 mmol/L (ref 135–145)

## 2019-06-07 LAB — MAGNESIUM: Magnesium: 1.7 mg/dL (ref 1.7–2.4)

## 2019-06-07 LAB — CBC
HCT: 23.8 % — ABNORMAL LOW (ref 39.0–52.0)
Hemoglobin: 7.5 g/dL — ABNORMAL LOW (ref 13.0–17.0)
MCH: 26.3 pg (ref 26.0–34.0)
MCHC: 31.5 g/dL (ref 30.0–36.0)
MCV: 83.5 fL (ref 80.0–100.0)
Platelets: 142 10*3/uL — ABNORMAL LOW (ref 150–400)
RBC: 2.85 MIL/uL — ABNORMAL LOW (ref 4.22–5.81)
RDW: 14.2 % (ref 11.5–15.5)
WBC: 2.8 10*3/uL — ABNORMAL LOW (ref 4.0–10.5)
nRBC: 0 % (ref 0.0–0.2)

## 2019-06-07 LAB — BPAM RBC
Blood Product Expiration Date: 202012212359
Blood Product Expiration Date: 202012212359
ISSUE DATE / TIME: 202011201306
ISSUE DATE / TIME: 202011201558
Unit Type and Rh: 5100
Unit Type and Rh: 5100

## 2019-06-07 LAB — TYPE AND SCREEN
ABO/RH(D): O POS
Antibody Screen: NEGATIVE
Unit division: 0
Unit division: 0

## 2019-06-07 MED ORDER — LIDOCAINE HCL (PF) 1 % IJ SOLN: 5.0000 mL | INTRAMUSCULAR | Status: DC | PRN

## 2019-06-07 MED ORDER — SODIUM CHLORIDE 0.9 % IV SOLN: 100.0000 mL | INTRAVENOUS | Status: DC | PRN

## 2019-06-07 MED ORDER — CHLORHEXIDINE GLUCONATE CLOTH 2 % EX PADS: 6.0000 | MEDICATED_PAD | Freq: Every day | CUTANEOUS | Status: DC

## 2019-06-07 MED ORDER — SODIUM CHLORIDE 0.9 % IV SOLN
510.0000 mg | Freq: Once | INTRAVENOUS | Status: AC
  Administered 2019-06-07: 510 mg via INTRAVENOUS
  Filled 2019-06-07: qty 510

## 2019-06-07 MED ORDER — PENTAFLUOROPROP-TETRAFLUOROETH EX AERO: 1.0000 "application " | INHALATION_SPRAY | CUTANEOUS | Status: DC | PRN

## 2019-06-07 MED ORDER — ALBUMIN HUMAN 25 % IV SOLN
25.0000 g | Freq: Once | INTRAVENOUS | Status: AC
  Administered 2019-06-07: 14:00:00 25 g via INTRAVENOUS

## 2019-06-07 MED ORDER — LIDOCAINE-PRILOCAINE 2.5-2.5 % EX CREA: 1.0000 "application " | TOPICAL_CREAM | CUTANEOUS | Status: DC | PRN

## 2019-06-07 MED ORDER — POTASSIUM CHLORIDE 10 MEQ/100ML IV SOLN
10.0000 meq | INTRAVENOUS | Status: AC
  Administered 2019-06-07 (×3): 10 meq via INTRAVENOUS
  Filled 2019-06-07 (×3): qty 100

## 2019-06-07 MED ORDER — ALBUMIN HUMAN 25 % IV SOLN
INTRAVENOUS | Status: AC
  Administered 2019-06-07: 25 g via INTRAVENOUS
  Filled 2019-06-07: qty 200

## 2019-06-07 NOTE — Progress Notes (Signed)
PROGRESS NOTE    Allen Davenport  YBO:[id number] DOB: 1940-12-26 DOA: 06/06/2019 PCP: [name], MD   Brief Narrative:  Per HPI: Allen Davenport is a 78 y.o. male with medical history significant for Carcinoid tumor, head and neck cancer, Prostate cancer, HTN, iron deficiency, ESRD-started 3 weeks ago, latent TB.   Patient saw his oncologist up of his carcinoid tumor, blood work showed hemoglobin of 5.7.  Patient was subsequently sent to the ED.  Reports generalized weakness over the past several days.  No chest pain, no difficulty breathing at rest or with exertion.  Reports chronic lower extremity swelling that has improved compared to prior since he was placed on a fluid pill.  He denies NSAID use.  No abdominal pain.  No black stools.  No vomiting of blood.  11/21: Patient was admitted with acute symptomatic anemia likely related to bone marrow suppression from chemotherapy along with iron deficiency.  Hemoglobin has improved from 5.7-7.5 this morning.  He is noted to have low potassium levels which will be supplemented.  Plans are to receive hemodialysis today with high potassium bath.  Follow-up lab work in a.m.  Anticipate discharge if stabilized in a.m.  Assessment & Plan:   Active Problems:   Acute anemia   Acute on chronic symptomatic anemia -Status post 2 unit PRBC transfusion with improvement in hemoglobin levels and no overt bleeding identified -Stool FOBT negative -Patient noted to have iron deficiency and will give dose of Feraheme as well -Monitor repeat CBC in a.m. -Continue to hold everolimus as this is known to cause bone marrow suppression and follow-up with Dr. Delton Coombes outpatient  ESRD on HD TTS -Discussed with nephrology and will plan for hemodialysis today with high potassium bath  Hypokalemia -Continues to worsen and will be given high potassium bath with hemodialysis -IV potassium supplementation -Recheck labs in a.m. along with magnesium   Hypertension -Continue home Coreg -Plan to resume Norvasc and hydralazine in a.m. if blood pressures improved  Carcinoid tumor-metastatic to liver and peritoneum -Following chemotherapy regimen outpatient with Dr. Delton Coombes -Currently on monthly octreotide injections -Holding daily everolimus  History of kidney cancer status post right-sided nephrectomy in May 2010  History of prostate cancer -Follows with urology -Continue tamsulosin  History of head neck cancer -Status post surgery and radiation therapy in 2009 -Currently in remission  Latent TB -Recently started on isoniazid and B6 to complete 88-month course of therapy with Dr. Linus Salmons. This will be continued  DVT prophylaxis: SCDs Code Status: Full code Family Communication: None at bedside Disposition Plan: Plan for dialysis today and correction of hypokalemia.  Monitor CBC in a.m.  Anticipate discharge in next 1 to 2 days if stabilized.   Consultants:   Nephrology  Procedures:   None  Antimicrobials:   None   Subjective: Patient seen and evaluated today with no new acute complaints or concerns. No acute concerns or events noted overnight.  He states that he is feeling better this morning.  Plans for dialysis today.  Objective: Vitals:   06/06/19 1930 06/06/19 2000 06/06/19 2114 06/07/19 0636  BP: 129/62 123/61 128/62 (!) 111/57  Pulse: 76 75 78 74  Resp: (!) 25 (!) 21 20 20   Temp:   98 F (36.7 C) 98.1 F (36.7 C)  TempSrc:   Oral Oral  SpO2: 96% 95% 100% 98%  Weight:   84.1 kg 85.4 kg  Height:   6\' 5"  (1.956 m)     Intake/Output Summary (Last 24 hours)  at 06/07/2019 1317 Last data filed at 06/07/2019 0933 Gross per 24 hour  Intake 1056.67 ml  Output 900 ml  Net 156.67 ml   Filed Weights   06/06/19 1043 06/06/19 2114 06/07/19 0636  Weight: 86.2 kg 84.1 kg 85.4 kg    Examination:  General exam: Appears calm and comfortable  Respiratory system: Clear to auscultation. Respiratory effort  normal. Cardiovascular system: S1 & S2 heard, RRR. No JVD, murmurs, rubs, gallops or clicks. No pedal edema. Gastrointestinal system: Abdomen is nondistended, soft and nontender. No organomegaly or masses felt. Normal bowel sounds heard. Central nervous system: Alert and oriented. No focal neurological deficits. Extremities: Symmetric 5 x 5 power. Skin: No rashes, lesions or ulcers Psychiatry: Judgement and insight appear normal. Mood & affect appropriate.     Data Reviewed: I have personally reviewed following labs and imaging studies  CBC: Recent Labs  Lab 06/04/19 1449 06/06/19 0910 06/07/19 0635  WBC 4.0 3.5* 2.8*  NEUTROABS 3,016  --   --   HGB 6.9* 5.7* 7.5*  HCT 21.7* 18.8* 23.8*  MCV 78.9* 83.9 83.5  PLT 198 162 557*   Basic Metabolic Panel: Recent Labs  Lab 06/04/19 1449 06/06/19 1148 06/07/19 0635  NA 138 137 137  K 3.0* 3.1* 2.7*  CL 96* 95* 98  CO2 30 29 28   GLUCOSE 117* 131* 133*  BUN 28* 29* 34*  CREATININE 5.34* 5.59* 6.32*  CALCIUM 7.3* 7.3* 7.0*  MG  --   --  1.7   GFR: Estimated Creatinine Clearance: 11.6 mL/min (A) (by C-G formula based on SCr of 6.32 mg/dL (H)). Liver Function Tests: Recent Labs  Lab 06/04/19 1449  AST 60*  ALT 18  BILITOT 0.9  PROT 5.4*   No results for input(s): LIPASE, AMYLASE in the last 168 hours. No results for input(s): AMMONIA in the last 168 hours. Coagulation Profile: No results for input(s): INR, PROTIME in the last 168 hours. Cardiac Enzymes: No results for input(s): CKTOTAL, CKMB, CKMBINDEX, TROPONINI in the last 168 hours. BNP (last 3 results) No results for input(s): PROBNP in the last 8760 hours. HbA1C: No results for input(s): HGBA1C in the last 72 hours. CBG: No results for input(s): GLUCAP in the last 168 hours. Lipid Profile: No results for input(s): CHOL, HDL, LDLCALC, TRIG, CHOLHDL, LDLDIRECT in the last 72 hours. Thyroid Function Tests: No results for input(s): TSH, T4TOTAL, FREET4, T3FREE,  THYROIDAB in the last 72 hours. Anemia Panel: Recent Labs    06/06/19 1149  VITAMINB12 558  FOLATE 6.5  FERRITIN 767*  TIBC 175*  IRON 15*  RETICCTPCT 3.0   Sepsis Labs: No results for input(s): PROCALCITON, LATICACIDVEN in the last 168 hours.  Recent Results (from the past 240 hour(s))  SARS CORONAVIRUS 2 (TAT 6-24 HRS) Nasopharyngeal Nasopharyngeal Swab     Status: None   Collection Time: 06/06/19  3:38 PM   Specimen: Nasopharyngeal Swab  Result Value Ref Range Status   SARS Coronavirus 2 NEGATIVE NEGATIVE Final    Comment: (NOTE) SARS-CoV-2 target nucleic acids are NOT DETECTED. The SARS-CoV-2 RNA is generally detectable in upper and lower respiratory specimens during the acute phase of infection. Negative results do not preclude SARS-CoV-2 infection, do not rule out co-infections with other pathogens, and should not be used as the sole basis for treatment or other patient management decisions. Negative results must be combined with clinical observations, patient history, and epidemiological information. The expected result is Negative. Fact Sheet for Patients: SugarRoll.be Fact Sheet for Healthcare  Providers: https://www.woods-mathews.com/ This test is not yet approved or cleared by the [place] and  has been authorized for detection and/or diagnosis of SARS-CoV-2 by FDA under an Emergency Use Authorization (EUA). This EUA will remain  in effect (meaning this test can be used) for the duration of the COVID-19 declaration under Section 56 4(b)(1) of the Act, 21 U.S.C. section 360bbb-3(b)(1), unless the authorization is terminated or revoked sooner. Performed at [facility], [place] [address], [place], [place] [address]          Radiology Studies: No results found.      Scheduled Meds: . atorvastatin  80 mg Oral q1800  . calcitRIOL  0.25 mcg Oral Daily  . carvedilol  3.125 mg Oral BID WC  .  Chlorhexidine Gluconate Cloth  6 each Topical Q0600  . isoniazid  300 mg Oral Daily  . metolazone  5 mg Oral QODAY  . pyridOXINE  50 mg Oral Daily  . sodium bicarbonate  650 mg Oral Daily  . tamsulosin  0.4 mg Oral Daily   Continuous Infusions:   LOS: 0 days    Time spent: 30 minutes    Lavaun Greenfield Darleen Crocker, DO [facility] Pager [phone]  If 7PM-7AM, please contact night-coverage [email or web address] Password [email or web address] 06/07/2019, 1:17 PM

## 2019-06-07 NOTE — Progress Notes (Signed)
Potassium = 2.7. Paged Dr. Manuella Ghazi for further instruction.

## 2019-06-07 NOTE — Progress Notes (Signed)
Was informed that this patient was in hospital.  He is a 78 year old WM  With a carcinoid tumor- metastatic on chemo (everolimus) and fairly new start to dialysis-  [facility] unit on TTS-  Was there on Thursday.  Last hgb per their records was 8.0-had not been started on ESA yet but not sure were going to due to history of cancer.   He is now admitted with a hgb of 5.7.  Nursing at dialysis did tell me they had been having issue with access and filters clotting with inability to return blood so that might be some of the issue - stool was hem occult negative.    I have spoken to [facility] and we will go ahead and perform dialysis today to keep him on schedule-  A high potassium bath and no heparin.  He has already received 2 units PRBCs- hgb 7.5 this AM.   Full consult to follow   Louis Meckel

## 2019-06-07 NOTE — Procedures (Signed)
    HEMODIALYSIS TREATMENT NOTE:  4 hour heparin-free dialysis completed via left upper arm AVF (16g ante/retrograde). Soft BPs pre/during treatment.  Under EDW. Goal met: 1 liter removed without interruption in ultrafiltration.  Albumin 25g given x 1.  Feraheme given with HD.  All blood was returned and hemostasis was achieved in 15 minutes.  Rockwell Alexandria, RN

## 2019-06-08 LAB — CBC
HCT: 23.2 % — ABNORMAL LOW (ref 39.0–52.0)
Hemoglobin: 7.3 g/dL — ABNORMAL LOW (ref 13.0–17.0)
MCH: 26.5 pg (ref 26.0–34.0)
MCHC: 31.5 g/dL (ref 30.0–36.0)
MCV: 84.4 fL (ref 80.0–100.0)
Platelets: 131 10*3/uL — ABNORMAL LOW (ref 150–400)
RBC: 2.75 MIL/uL — ABNORMAL LOW (ref 4.22–5.81)
RDW: 14.4 % (ref 11.5–15.5)
WBC: 2.2 10*3/uL — ABNORMAL LOW (ref 4.0–10.5)
nRBC: 0 % (ref 0.0–0.2)

## 2019-06-08 LAB — MAGNESIUM: Magnesium: 1.7 mg/dL (ref 1.7–2.4)

## 2019-06-08 LAB — BASIC METABOLIC PANEL
Anion gap: 9 (ref 5–15)
BUN: 16 mg/dL (ref 8–23)
CO2: 28 mmol/L (ref 22–32)
Calcium: 7 mg/dL — ABNORMAL LOW (ref 8.9–10.3)
Chloride: 99 mmol/L (ref 98–111)
Creatinine, Ser: 3.97 mg/dL — ABNORMAL HIGH (ref 0.61–1.24)
GFR calc Af Amer: 16 mL/min — ABNORMAL LOW (ref >60)
GFR calc non Af Amer: 14 mL/min — ABNORMAL LOW (ref >60)
Glucose, Bld: 109 mg/dL — ABNORMAL HIGH (ref 70–99)
Potassium: 3.1 mmol/L — ABNORMAL LOW (ref 3.5–5.1)
Sodium: 136 mmol/L (ref 135–145)

## 2019-06-08 MED ORDER — POTASSIUM CHLORIDE CRYS ER 20 MEQ PO TBCR
40.0000 meq | EXTENDED_RELEASE_TABLET | Freq: Once | ORAL | Status: AC
  Administered 2019-06-08: 40 meq via ORAL
  Filled 2019-06-08: qty 2

## 2019-06-08 NOTE — Discharge Summary (Signed)
Physician Discharge Summary  Allen Davenport XBW:[id number] DOB: 1940/07/22 DOA: 06/06/2019  PCP: [name], MD  Admit date: 06/06/2019  Discharge date: 06/08/2019  Admitted From:Home  Disposition:  Home  Recommendations for Outpatient Follow-up:  1. Follow up with PCP in 1-2 weeks 2. Please obtain BMP during next hemodialysis session and this will be arranged by Dr. Moshe Cipro to follow potassium level 3. Follow-up with Dr. Delton Coombes in 1 week to repeat CBC and stay off everolimus for now 4. Stay off amlodipine and hydralazine for now until BP levels are rechecked and noted to be improved  Home Health: None  Equipment/Devices: None  Discharge Condition: Stable  CODE STATUS: Full  Diet recommendation: Heart Healthy  Brief/Interim Summary: Per HPI: Allen Iser Scalesis a 78 y.o.malewith medical history significant forCarcinoid tumor, head and neck cancer, Prostate cancer, HTN, iron deficiency, ESRD-started 3 weeks ago, latent TB. Patient saw his oncologist up of his carcinoid tumor,blood work showed hemoglobin of 5.7.Patient was subsequently sent to the ED. Reports generalized weakness over the past several days. No chest pain, no difficulty breathing at rest or with exertion. Reports chronic lower extremity swelling that has improved compared to prior since he was placed on a fluid pill.He denies NSAID use. No abdominal pain. No black stools. No vomiting of blood.  11/21: Patient was admitted with acute symptomatic anemia likely related to bone marrow suppression from chemotherapy along with iron deficiency.  Hemoglobin has improved from 5.7-7.5 this morning.  He is noted to have low potassium levels which will be supplemented.  Plans are to receive hemodialysis today with high potassium bath.  Follow-up lab work in a.m.  Anticipate discharge if stabilized in a.m.  11/22: Patient noted to be stable for discharge today with hemoglobin remaining at 7.3 which is  stable from 7.5 yesterday.  He has had no positive fecal occult blood or overt bleeding noted.  He has undergone his hemodialysis yesterday without any difficulty and will remain on his usual TTS schedule.  His potassium remains low at 3.1, but is improved from yesterday.  Discussed this with nephrology who recommends oral potassium supplementation and will order repeat BMP at next dialysis visit to ensure that potassium levels remain improved.  His blood pressures are well controlled without the use of hydralazine or amlodipine and therefore, will plan to hold these 2 medications until further blood pressure levels are checked.  He will also need close follow-up with his hematologist Dr. Delton Coombes which should be arranged in the following week to recheck CBC levels.  We will plan to hold everolimus in the meantime.  Discharge Diagnoses:  Active Problems:   Acute anemia  Principal discharge diagnosis: Acute on chronic symptomatic anemia status post 2 unit PRBC transfusion.  Patient also given Feraheme for iron deficiency.  Discharge Instructions  Discharge Instructions    Diet - low sodium heart healthy   Complete by: As directed    Increase activity slowly   Complete by: As directed      Allergies as of 06/08/2019   No Known Allergies     Medication List    STOP taking these medications   everolimus 5 MG tablet Commonly known as: AFINITOR   hydrALAZINE 25 MG tablet Commonly known as: APRESOLINE   Norvasc 10 MG tablet Generic drug: amLODipine     TAKE these medications   alendronate 70 MG tablet Commonly known as: FOSAMAX TAKE ONE TABLET BY MOUTH ONCE A WEEK IN THE MORNING WITH A FULL GLASS OF  WATER ON AN EMPTY STOMACH. DO NOT LAY DOWN FOR 30 MINUTES.   calcitRIOL 0.25 MCG capsule Commonly known as: ROCALTROL Take 0.25 mcg by mouth daily.   carvedilol 3.125 MG tablet Commonly known as: COREG Take 3.125 mg by mouth 2 (two) times daily with a meal.   dexamethasone 0.5  MG/5ML solution Commonly known as: DECADRON SWISH TEN ML BY MOUTH FOUR TIMES DAILY FOR TWO MINUTES THEN SPIT   epoetin alfa 4000 UNIT/ML injection Commonly known as: EPOGEN Inject into the skin.   Fish Oil 1000 MG Caps Take 1 capsule by mouth daily.   iron polysaccharides 150 MG capsule Commonly known as: NIFEREX Take by mouth.   isoniazid 300 MG tablet Commonly known as: NYDRAZID Take 1 tablet (300 mg total) by mouth daily.   Lipitor 80 MG tablet Generic drug: atorvastatin Take 80 mg by mouth daily at 6 PM.   metolazone 5 MG tablet Commonly known as: ZAROXOLYN Take 5 mg by mouth every other day.   pyridOXINE 50 MG tablet Commonly known as: B-6 Take 1 tablet (50 mg total) by mouth daily.   octreotide 30 MG injection Commonly known as: SANDOSTATIN LAR Inject into the muscle.   SandoSTATIN LAR [place] 30 MG injection Generic drug: octreotide Inject into the muscle.   sodium bicarbonate 650 MG tablet Take 650 mg by mouth daily.   tamsulosin 0.4 MG Caps capsule Commonly known as: FLOMAX Take 0.4 mg by mouth daily.   Vitamin D3 250 MCG (10000 UT) Tabs Take by mouth.      Follow-up Information    [name], MD Follow up in 1 week(s).   Specialty: Internal Medicine Contact information: [place] [address] [id number] [phone]        Derek Jack, MD Follow up in 1 week(s).   Specialty: Hematology Contact information: [place] [address] [phone]          No Known Allergies  Consultations:  Nephrology  Oncology over phone   Procedures/Studies:  No results found.   Discharge Exam: Vitals:   06/07/19 2154 06/08/19 0659  BP: 112/63 112/60  Pulse: 85 77  Resp: 18 18  Temp: 98.5 F (36.9 C) 97.8 F (36.6 C)  SpO2: 96% 97%   Vitals:   06/07/19 1745 06/07/19 2154 06/08/19 0659 06/08/19 0716  BP: 132/70 112/63 112/60   Pulse: 77 85 77   Resp: 18 18 18    Temp: 98.1 F (36.7 C) 98.5 F (36.9  C) 97.8 F (36.6 C)   TempSrc: Oral Oral Oral   SpO2: 98% 96% 97%   Weight:    82.6 kg  Height:        General: Pt is alert, awake, not in acute distress Cardiovascular: RRR, S1/S2 +, no rubs, no gallops Respiratory: CTA bilaterally, no wheezing, no rhonchi Abdominal: Soft, NT, ND, bowel sounds + Extremities: no edema, no cyanosis    The results of significant diagnostics from this hospitalization (including imaging, microbiology, ancillary and laboratory) are listed below for reference.     Microbiology: Recent Results (from the past 240 hour(s))  SARS CORONAVIRUS 2 (TAT 6-24 HRS) Nasopharyngeal Nasopharyngeal Swab     Status: None   Collection Time: 06/06/19  3:38 PM   Specimen: Nasopharyngeal Swab  Result Value Ref Range Status   SARS Coronavirus 2 NEGATIVE NEGATIVE Final    Comment: (NOTE) SARS-CoV-2 target nucleic acids are NOT DETECTED. The SARS-CoV-2 RNA is generally detectable in upper and lower  respiratory specimens during the acute phase of infection. Negative results do not preclude SARS-CoV-2 infection, do not rule out co-infections with other pathogens, and should not be used as the sole basis for treatment or other patient management decisions. Negative results must be combined with clinical observations, patient history, and epidemiological information. The expected result is Negative. Fact Sheet for Patients: SugarRoll.be Fact Sheet for Healthcare Providers: https://www.woods-mathews.com/ This test is not yet approved or cleared by the [place] FDA and  has been authorized for detection and/or diagnosis of SARS-CoV-2 by FDA under an Emergency Use Authorization (EUA). This EUA will remain  in effect (meaning this test can be used) for the duration of the COVID-19 declaration under Section 56 4(b)(1) of the Act, 21 U.S.C. section 360bbb-3(b)(1), unless the authorization is terminated or revoked  sooner. Performed at [facility], [place] [address] [place], [place] [address]      Labs: BNP (last 3 results) No results for input(s): BNP in the last 8760 hours. Basic Metabolic Panel: Recent Labs  Lab 06/04/19 1449 06/06/19 1148 06/07/19 0635 06/08/19 0614  NA 138 137 137 136  K 3.0* 3.1* 2.7* 3.1*  CL 96* 95* 98 99  CO2 30 29 28 28   GLUCOSE 117* 131* 133* 109*  BUN 28* 29* 34* 16  CREATININE 5.34* 5.59* 6.32* 3.97*  CALCIUM 7.3* 7.3* 7.0* 7.0*  MG  --   --  1.7 1.7   Liver Function Tests: Recent Labs  Lab 06/04/19 1449  AST 60*  ALT 18  BILITOT 0.9  PROT 5.4*   No results for input(s): LIPASE, AMYLASE in the last 168 hours. No results for input(s): AMMONIA in the last 168 hours. CBC: Recent Labs  Lab 06/04/19 1449 06/06/19 0910 06/07/19 0635 06/08/19 0614  WBC 4.0 3.5* 2.8* 2.2*  NEUTROABS 3,016  --   --   --   HGB 6.9* 5.7* 7.5* 7.3*  HCT 21.7* 18.8* 23.8* 23.2*  MCV 78.9* 83.9 83.5 84.4  PLT 198 162 142* 131*   Cardiac Enzymes: No results for input(s): CKTOTAL, CKMB, CKMBINDEX, TROPONINI in the last 168 hours. BNP: Invalid input(s): POCBNP CBG: No results for input(s): GLUCAP in the last 168 hours. D-Dimer No results for input(s): DDIMER in the last 72 hours. Hgb A1c No results for input(s): HGBA1C in the last 72 hours. Lipid Profile No results for input(s): CHOL, HDL, LDLCALC, TRIG, CHOLHDL, LDLDIRECT in the last 72 hours. Thyroid function studies No results for input(s): TSH, T4TOTAL, T3FREE, THYROIDAB in the last 72 hours.  Invalid input(s): FREET3 Anemia work up Recent Labs    06/06/19 1149  VITAMINB12 558  FOLATE 6.5  FERRITIN 767*  TIBC 175*  IRON 15*  RETICCTPCT 3.0   Urinalysis No results found for: COLORURINE, APPEARANCEUR, LABSPEC, [place], GLUCOSEU, [place], [place], [place], PROTEINUR, UROBILINOGEN, NITRITE, LEUKOCYTESUR Sepsis Labs Invalid input(s): PROCALCITONIN,  WBC,  LACTICIDVEN Microbiology Recent  Results (from the past 240 hour(s))  SARS CORONAVIRUS 2 (TAT 6-24 HRS) Nasopharyngeal Nasopharyngeal Swab     Status: None   Collection Time: 06/06/19  3:38 PM   Specimen: Nasopharyngeal Swab  Result Value Ref Range Status   SARS Coronavirus 2 NEGATIVE NEGATIVE Final    Comment: (NOTE) SARS-CoV-2 target nucleic acids are NOT DETECTED. The SARS-CoV-2 RNA is generally detectable in upper and lower respiratory specimens during the acute phase of infection. Negative results do not preclude SARS-CoV-2 infection, do not rule out co-infections with other pathogens, and should not be used as the sole  basis for treatment or other patient management decisions. Negative results must be combined with clinical observations, patient history, and epidemiological information. The expected result is Negative. Fact Sheet for Patients: SugarRoll.be Fact Sheet for Healthcare Providers: https://www.woods-mathews.com/ This test is not yet approved or cleared by the [place] FDA and  has been authorized for detection and/or diagnosis of SARS-CoV-2 by FDA under an Emergency Use Authorization (EUA). This EUA will remain  in effect (meaning this test can be used) for the duration of the COVID-19 declaration under Section 56 4(b)(1) of the Act, 21 U.S.C. section 360bbb-3(b)(1), unless the authorization is terminated or revoked sooner. Performed at [facility], [place] [address] [place], [place] [address]      Time coordinating discharge: 35 minutes  SIGNED:   Rodena Goldmann, DO [facility] 06/08/2019, 10:17 AM  If 7PM-7AM, please contact night-coverage [email or web address]

## 2019-06-08 NOTE — Discharge Instructions (Addendum)
Anemia  Anemia is a condition in which you do not have enough red blood cells or hemoglobin. Hemoglobin is a substance in red blood cells that carries oxygen. When you do not have enough red blood cells or hemoglobin (are anemic), your body cannot get enough oxygen and your organs may not work properly. As a result, you may feel very tired or have other problems. What are the causes? Common causes of anemia include:  Excessive bleeding. Anemia can be caused by excessive bleeding inside or outside the body, including bleeding from the intestine or from periods in women.  Poor nutrition.  Long-lasting (chronic) kidney, thyroid, and liver disease.  Bone marrow disorders.  Cancer and treatments for cancer.  HIV (human immunodeficiency virus) and AIDS (acquired immunodeficiency syndrome).  Treatments for HIV and AIDS.  Spleen problems.  Blood disorders.  Infections, medicines, and autoimmune disorders that destroy red blood cells. What are the signs or symptoms? Symptoms of this condition include:  Minor weakness.  Dizziness.  Headache.  Feeling heartbeats that are irregular or faster than normal (palpitations).  Shortness of breath, especially with exercise.  Paleness.  Cold sensitivity.  Indigestion.  Nausea.  Difficulty sleeping.  Difficulty concentrating. Symptoms may occur suddenly or develop slowly. If your anemia is mild, you may not have symptoms. How is this diagnosed? This condition is diagnosed based on:  Blood tests.  Your medical history.  A physical exam.  Bone marrow biopsy. Your health care provider may also check your stool (feces) for blood and may do additional testing to look for the cause of your bleeding. You may also have other tests, including:  Imaging tests, such as a CT scan or MRI.  Endoscopy.  Colonoscopy. How is this treated? Treatment for this condition depends on the cause. If you continue to lose a lot of blood, you may  need to be treated at a hospital. Treatment may include:  Taking supplements of iron, vitamin M08, or folic acid.  Taking a hormone medicine (erythropoietin) that can help to stimulate red blood cell growth.  Having a blood transfusion. This may be needed if you lose a lot of blood.  Making changes to your diet.  Having surgery to remove your spleen. Follow these instructions at home:  Take over-the-counter and prescription medicines only as told by your health care provider.  Take supplements only as told by your health care provider.  Follow any diet instructions that you were given.  Keep all follow-up visits as told by your health care provider. This is important. Contact a health care provider if:  You develop new bleeding anywhere in the body. Get help right away if:  You are very weak.  You are short of breath.  You have pain in your abdomen or chest.  You are dizzy or feel faint.  You have trouble concentrating.  You have bloody or black, tarry stools.  You vomit repeatedly or you vomit up blood. Summary  Anemia is a condition in which you do not have enough red blood cells or enough of a substance in your red blood cells that carries oxygen (hemoglobin).  Symptoms may occur suddenly or develop slowly.  If your anemia is mild, you may not have symptoms.  This condition is diagnosed with blood tests as well as a medical history and physical exam. Other tests may be needed.  Treatment for this condition depends on the cause of the anemia. This information is not intended to replace advice given to you by  your health care provider. Make sure you discuss any questions you have with your health care provider. °Document Released: 08/10/2004 Document Revised: 06/15/2017 Document Reviewed: 08/04/2016 °Elsevier Patient Education © 2020 Elsevier Inc. ° °

## 2019-06-08 NOTE — Progress Notes (Signed)
I see plans for discharge today as hgb is stable status post transfusion.  He received dialysis yesterday here with volume removal of 1 L- no heparin, high potassium bath.  Is okay by me to be discharged today and he can resume his outpatient dialysis on Tuesday 11/24  [place]

## 2019-06-08 NOTE — Progress Notes (Signed)
Pt tolerated his morning medications. He was in a great mood, watching TV. No complaints of anything. Will continue to monitor until discharge today.

## 2019-06-08 NOTE — Plan of Care (Signed)
Reviewed

## 2019-06-09 ENCOUNTER — Inpatient Hospital Stay (HOSPITAL_COMMUNITY): Payer: Medicare Other

## 2019-06-09 ENCOUNTER — Other Ambulatory Visit: Payer: Self-pay

## 2019-06-09 ENCOUNTER — Telehealth: Payer: Self-pay

## 2019-06-09 ENCOUNTER — Encounter (HOSPITAL_COMMUNITY): Payer: Self-pay | Admitting: Hematology

## 2019-06-09 ENCOUNTER — Inpatient Hospital Stay (HOSPITAL_BASED_OUTPATIENT_CLINIC_OR_DEPARTMENT_OTHER): Payer: Medicare Other | Admitting: Hematology

## 2019-06-09 VITALS — BP 108/59 | HR 90 | Temp 97.5°F | Resp 18 | Wt 187.4 lb

## 2019-06-09 DIAGNOSIS — C7A00 Malignant carcinoid tumor of unspecified site: Secondary | ICD-10-CM | POA: Diagnosis not present

## 2019-06-09 DIAGNOSIS — D631 Anemia in chronic kidney disease: Secondary | ICD-10-CM | POA: Diagnosis not present

## 2019-06-09 DIAGNOSIS — I959 Hypotension, unspecified: Secondary | ICD-10-CM | POA: Diagnosis not present

## 2019-06-09 DIAGNOSIS — C179 Malignant neoplasm of small intestine, unspecified: Secondary | ICD-10-CM | POA: Diagnosis not present

## 2019-06-09 DIAGNOSIS — C7B02 Secondary carcinoid tumors of liver: Secondary | ICD-10-CM | POA: Diagnosis not present

## 2019-06-09 DIAGNOSIS — N189 Chronic kidney disease, unspecified: Secondary | ICD-10-CM

## 2019-06-09 DIAGNOSIS — N186 End stage renal disease: Secondary | ICD-10-CM | POA: Diagnosis not present

## 2019-06-09 DIAGNOSIS — D509 Iron deficiency anemia, unspecified: Secondary | ICD-10-CM

## 2019-06-09 DIAGNOSIS — Z992 Dependence on renal dialysis: Secondary | ICD-10-CM | POA: Diagnosis not present

## 2019-06-09 LAB — CBC
HCT: 27.8 % — ABNORMAL LOW (ref 39.0–52.0)
Hemoglobin: 8.5 g/dL — ABNORMAL LOW (ref 13.0–17.0)
MCH: 26.2 pg (ref 26.0–34.0)
MCHC: 30.6 g/dL (ref 30.0–36.0)
MCV: 85.5 fL (ref 80.0–100.0)
Platelets: 153 10*3/uL (ref 150–400)
RBC: 3.25 MIL/uL — ABNORMAL LOW (ref 4.22–5.81)
RDW: 14.4 % (ref 11.5–15.5)
WBC: 3.7 10*3/uL — ABNORMAL LOW (ref 4.0–10.5)
nRBC: 0 % (ref 0.0–0.2)

## 2019-06-09 MED ORDER — OCTREOTIDE ACETATE 30 MG IM KIT
PACK | INTRAMUSCULAR | Status: AC
  Filled 2019-06-09: qty 1

## 2019-06-09 MED ORDER — EPOETIN ALFA-EPBX 10000 UNIT/ML IJ SOLN
20000.0000 [IU] | Freq: Once | INTRAMUSCULAR | Status: AC
  Administered 2019-06-09: 20000 [IU] via SUBCUTANEOUS
  Filled 2019-06-09: qty 2

## 2019-06-09 MED ORDER — OCTREOTIDE ACETATE 30 MG IM KIT
30.0000 mg | PACK | Freq: Once | INTRAMUSCULAR | Status: AC
  Administered 2019-06-09: 30 mg via INTRAMUSCULAR

## 2019-06-09 NOTE — Assessment & Plan Note (Signed)
1.  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  Initially managed at [facility].  Transferred care to [facility] in October 2017. - Gallium-68 scan on 10/23/2017 shows metastatic disease in the liver, central right mesenteric mass and multiple peritoneal deposits. -Gallium-68 dotatate PET scan on 04/22/2018 showed multiple foci of intense radiotracer activity consistent with metastatic well-differentiated carcinoid tumor.  Metastatic sites include central mesentery, multiple peritoneal implants, liver metastasis.  Several metastatic lesions have measured higher activity.  Additional mild increase in size of central mesenteric mass.  Lesion within the right ventricle along the interventricular septum thought to be metastatic. - 2D echo on 05/03/2018 showed normal cavity size of the right ventricle with normal systolic function.  -Everolimus 5 mg daily started on 05/17/2018 along with dexamethasone mouthwash. - Gallium-68 dotatate scan on 11/27/2018 shows overall stable well-differentiated neuroendocrine tumor metastasis at multiple sites in the liver, peritoneum and potentially heart.  Stable activity of liver lesions.  Decrease in size and activity of the right central mesenteric mass.  Decrease in activity of peritoneal implants. - Restaging gallium-68 dotatate scan on April 08, 2019 revealed no new evidence of disease progression.  Overall his exam was stable compared to prior.  There was a new nodularity in the right lung base that was non-FDG avid. -He was admitted to the hospital on 06/07/2019 with a hemoglobin of 5.7.  He received blood transfusion. -He denies any bleeding per rectum or melena.  Likely etiology is bone marrow suppression from everolimus. -Everolimus has been on hold since Friday. -He will continue with Sandostatin injection today.  I will reevaluate him in 2 weeks.  Based on his counts, we will make a decision when  to start back on everolimus.  2.  Normocytic anemia: -He was receiving Procrit injections previously.  However he was started on hemodialysis on 05/20/2019. -Last Feraheme infusion was on 06/07/2019. -I plan to check his ferritin, iron panel, methylmalonic acid, B12 levels at next visit.  3.  ESRD on HD: -He is reportedly started on hemodialysis on 05/20/2019. -He is being dialyzed on Tuesday, Thursday and Saturday at [facility] in [place].

## 2019-06-09 NOTE — Patient Instructions (Signed)
[facility]  Discharge Instructions:   _______________________________________________________________  Thank you for choosing [facility] to provide your oncology and hematology care.  To afford each patient quality time with our providers, please arrive at least 15 minutes before your scheduled appointment.  You need to re-schedule your appointment if you arrive 10 or more minutes late.  We strive to give you quality time with our providers, and arriving late affects you and other patients whose appointments are after yours.  Also, if you no show three or more times for appointments you may be dismissed from the clinic.  Again, thank you for choosing [facility]. Our hope is that these requests will allow you access to exceptional care and in a timely manner. _______________________________________________________________  If you have questions after your visit, please contact our office at [phone] between the hours of 8:30 a.m. and 5:00 p.m. Voicemails left after 4:30 p.m. will not be returned until the following business day. _______________________________________________________________  For prescription refill requests, have your pharmacy contact our office. _______________________________________________________________  Recommendations made by the consultant and any test results will be sent to your referring physician. _______________________________________________________________ 

## 2019-06-09 NOTE — Progress Notes (Signed)
[place], [place] [address]   CLINIC:  Medical Oncology/Hematology  PCP:  [name], MD [place] [phone]   REASON FOR VISIT:  Follow-up for metastatic carcinoid tumor  CURRENT THERAPY: Sandostatin monthly.  Everolimus on hold.     INTERVAL HISTORY:  Allen Davenport 78 y.o. male seen for follow-up of metastatic carcinoid to the liver and peritoneum.  He was admitted to the hospital from 06/07/2019 through 06/08/2019 for hemoglobin of 5.7 and had received 2 units of PRBC while in the hospital.  Stool for occult blood was negative.  He was reportedly started on hemodialysis on 05/20/2019.  His T03 and folic acid were normal.  Ferritin was 767 with percent saturation was 9.  He reported appetite and energy levels at 75%.  No pain reported.  Denies any nausea, vomiting, diarrhea or constipation.  No mucositis reported.     REVIEW OF SYSTEMS:  Review of Systems  All other systems reviewed and are negative.    PAST MEDICAL/SURGICAL HISTORY:  Past Medical History:  Diagnosis Date  . Anemia   . Cancer [facility])    right renal . Prostate- Radiation treatment  . CHF (congestive heart failure) ([place])   . Chronic kidney disease    stage 4  . Edema   . Heart murmur    "nothing to worry about"  . Hyperlipidemia   . Hypertension   . Malignant carcinoid tumor ([place]) 01/03/2016   Past Surgical History:  Procedure Laterality Date  . AV FISTULA PLACEMENT Left 10/20/2014   Procedure: Left Arm ARTERIOVENOUS (AV) FISTULA CREATION;  Surgeon: Elam Dutch, MD;  Location: [facility];  Service: Vascular;  Laterality: Left;  . NEPHRECTOMY Right 2010     SOCIAL HISTORY:  Social History   Socioeconomic History  . Marital status: Married    Spouse name: Not on file  . Number of children: Not on file  . Years of education: Not on file  . Highest education level: Not on file  Occupational History  . Not on file  Social  Needs  . Financial resource strain: Not on file  . Food insecurity    Worry: Not on file    Inability: Not on file  . Transportation needs    Medical: Not on file    Non-medical: Not on file  Tobacco Use  . Smoking status: Never Smoker  . Smokeless tobacco: Never Used  Substance and Sexual Activity  . Alcohol use: No    Alcohol/week: 0.0 standard drinks  . Drug use: No  . Sexual activity: Not on file  Lifestyle  . Physical activity    Days per week: Not on file    Minutes per session: Not on file  . Stress: Not on file  Relationships  . Social Herbalist on phone: Not on file    Gets together: Not on file    Attends religious service: Not on file    Active member of club or organization: Not on file    Attends meetings of clubs or organizations: Not on file    Relationship status: Not on file  . Intimate partner violence    Fear of current or ex partner: Not on file    Emotionally abused: Not on file    Physically abused: Not on file    Forced sexual activity: Not on file  Other Topics Concern  . Not on file  Social History Narrative  . Not on file    FAMILY HISTORY:  Family History  Problem Relation Age of Onset  . Cancer Mother   . Hypertension Father   . Cancer Sister     CURRENT MEDICATIONS:  Outpatient Encounter Medications as of 06/09/2019  Medication Sig Note  . alendronate (FOSAMAX) 70 MG tablet TAKE ONE TABLET BY MOUTH ONCE A WEEK IN THE MORNING WITH A FULL GLASS OF WATER ON AN EMPTY STOMACH. DO NOT LAY DOWN FOR 30 MINUTES.   [name] atorvastatin (LIPITOR) 80 MG tablet Take 80 mg by mouth daily at 6 PM.    . calcitRIOL (ROCALTROL) 0.25 MCG capsule Take 0.25 mcg by mouth daily.    . carvedilol (COREG) 3.125 MG tablet Take 3.125 mg by mouth 2 (two) times daily with a meal.    . Cholecalciferol (VITAMIN D3) 250 MCG (10000 UT) TABS Take by mouth.   . dexamethasone (DECADRON) 0.5 MG/5ML solution SWISH TEN ML BY MOUTH FOUR TIMES DAILY FOR TWO MINUTES  THEN SPIT   . epoetin alfa (EPOGEN) 4000 UNIT/ML injection Inject into the skin.   [name] iron polysaccharides (NIFEREX) 150 MG capsule Take by mouth.   . isoniazid (NYDRAZID) 300 MG tablet Take 1 tablet (300 mg total) by mouth daily.   . metolazone (ZAROXOLYN) 5 MG tablet Take 5 mg by mouth every other day.   [name] octreotide (SANDOSTATIN LAR DEPOT) 30 MG injection Inject into the muscle.   [name] octreotide (SANDOSTATIN LAR) 30 MG injection Inject into the muscle.   . Omega-3 Fatty Acids (FISH OIL) 1000 MG CAPS Take 1 capsule by mouth daily.   [name] pyridOXINE (B-6) 50 MG tablet Take 1 tablet (50 mg total) by mouth daily.   . sodium bicarbonate 650 MG tablet Take 650 mg by mouth daily.   . tamsulosin (FLOMAX) 0.4 MG CAPS capsule Take 0.4 mg by mouth daily.  04/26/2016: Received from: External Pharmacy   No facility-administered encounter medications on file as of 06/09/2019.     ALLERGIES:  No Known Allergies   PHYSICAL EXAM:  ECOG Performance status: 1  Vitals:   06/09/19 1431  BP: (!) 108/59  Pulse: 90  Resp: 18  Temp: (!) 97.5 F (36.4 C)  SpO2: 100%   Filed Weights   06/09/19 1431  Weight: 187 lb 6.4 oz (85 kg)    Physical Exam Vitals signs reviewed.  Constitutional:      Appearance: Normal appearance.  Cardiovascular:     Rate and Rhythm: Normal rate and regular rhythm.     Heart sounds: Normal heart sounds.  Pulmonary:     Effort: Pulmonary effort is normal.     Breath sounds: Normal breath sounds.  Abdominal:     General: There is no distension.     Palpations: Abdomen is soft. There is no mass.  Musculoskeletal:        General: No swelling.  Skin:    General: Skin is warm.  Neurological:     General: No focal deficit present.     Mental Status: He is alert and oriented to person, place, and time.  Psychiatric:        Mood and Affect: Mood normal.        Behavior: Behavior normal.      LABORATORY DATA:  I have reviewed the labs as listed.  CBC    Component  Value Date/Time   WBC 3.7 (L) 06/09/2019 1355   RBC 3.25 (L) 06/09/2019 1355   HGB  8.5 (L) 06/09/2019 1355   HCT 27.8 (L) 06/09/2019 1355   PLT 153 06/09/2019 1355   MCV 85.5 06/09/2019 1355   MCH 26.2 06/09/2019 1355   MCHC 30.6 06/09/2019 1355   RDW 14.4 06/09/2019 1355   LYMPHSABS 224 (L) 06/04/2019 1449   MONOABS 0.6 05/09/2019 1049   EOSABS 72 06/04/2019 1449   BASOSABS 20 06/04/2019 1449   CMP Latest Ref Rng & Units 06/08/2019 06/07/2019 06/06/2019  Glucose 70 - 99 mg/dL 109(H) 133(H) 131(H)  BUN 8 - 23 mg/dL 16 34(H) 29(H)  Creatinine 0.61 - 1.24 mg/dL 3.97(H) 6.32(H) 5.59(H)  Sodium 135 - 145 mmol/L 136 137 137  Potassium 3.5 - 5.1 mmol/L 3.1(L) 2.7(LL) 3.1(L)  Chloride 98 - 111 mmol/L 99 98 95(L)  CO2 22 - 32 mmol/L 28 28 29   Calcium 8.9 - 10.3 mg/dL 7.0(L) 7.0(L) 7.3(L)  Total Protein 6.1 - 8.1 g/dL - - -  Total Bilirubin 0.2 - 1.2 mg/dL - - -  Alkaline Phos 38 - 126 U/L - - -  AST 10 - 35 U/L - - -  ALT 9 - 46 U/L - - -       DIAGNOSTIC IMAGING:  I have independently reviewed the scans and discussed with the patient.   I have reviewed Venita Lick LPN's note and agree with the documentation.  I personally performed a face-to-face visit, made revisions and my assessment and plan is as follows.    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  Initially managed at [facility].  Transferred care to [facility] in October 2017. - Gallium-68 scan on 10/23/2017 shows metastatic disease in the liver, central right mesenteric mass and multiple peritoneal deposits. -Gallium-68 dotatate PET scan on 04/22/2018 showed multiple foci of intense radiotracer activity consistent with metastatic well-differentiated carcinoid tumor.  Metastatic sites include central mesentery, multiple peritoneal implants, liver metastasis.  Several metastatic lesions have measured  higher activity.  Additional mild increase in size of central mesenteric mass.  Lesion within the right ventricle along the interventricular septum thought to be metastatic. - 2D echo on 05/03/2018 showed normal cavity size of the right ventricle with normal systolic function.  -Everolimus 5 mg daily started on 05/17/2018 along with dexamethasone mouthwash. - Gallium-68 dotatate scan on 11/27/2018 shows overall stable well-differentiated neuroendocrine tumor metastasis at multiple sites in the liver, peritoneum and potentially heart.  Stable activity of liver lesions.  Decrease in size and activity of the right central mesenteric mass.  Decrease in activity of peritoneal implants. - Restaging gallium-68 dotatate scan on April 08, 2019 revealed no new evidence of disease progression.  Overall his exam was stable compared to prior.  There was a new nodularity in the right lung base that was non-FDG avid. -He was admitted to the hospital on 06/07/2019 with a hemoglobin of 5.7.  He received blood transfusion. -He denies any bleeding per rectum or melena.  Likely etiology is bone marrow suppression from everolimus. -Everolimus has been on hold since Friday. -He will continue with Sandostatin injection today.  I will reevaluate him in 2 weeks.  Based on his counts, we will make a decision when to start back on everolimus.  2.  Normocytic anemia: -He was receiving Procrit injections previously.  However he was started on hemodialysis on 05/20/2019. -Last Feraheme infusion was on 06/07/2019. -I plan to check his ferritin,  iron panel, methylmalonic acid, B12 levels at next visit.  3.  ESRD on HD: -He is reportedly started on hemodialysis on 05/20/2019. -He is being dialyzed on Tuesday, Thursday and Saturday at [facility] in [place].     Total time spent is 25 minutes with more than 50% of the time spent face-to-face discussing and reviewing hospital records, treatment plan, counseling and coordination of care.   Orders placed this encounter:  Orders Placed This Encounter  Procedures  . CBC with Differential  . Iron and TIBC  . Ferritin  . Methylmalonic acid, serum  . Copper, serum  . Comprehensive metabolic panel      Derek Jack, MD [place] [phone]

## 2019-06-09 NOTE — Telephone Encounter (Signed)
Referral sent from [facility] in [place] for patient to have fistulogram due to "lines clotted several times, prolonged bleeding post treatment"   [place]  [phone]

## 2019-06-09 NOTE — Progress Notes (Signed)
Allen Davenport presents today for injection per MD orders. Sandostatin administered IM in right Gluteal. Administration without incident. Patient tolerated well.  Allen Davenport presents today for injection per MD orders. Retacrit administered SQ in right Upper Arm. Administration without incident. Patient tolerated well.  No complaints at this time. Discharged from clinic ambulatory. F/U with [facility] as scheduled.

## 2019-06-09 NOTE — Patient Instructions (Addendum)
[place] at [facility] Discharge Instructions  You were seen today by Dr. Delton Coombes. He went over your recent test results. Continue holding the cancer pill until you see [place] again. He will see you back in 2 weeks for labs and follow up.   Thank you for choosing [place] at [facility] to provide your oncology and hematology care.  To afford each patient quality time with our provider, please arrive at least 15 minutes before your scheduled appointment time.   If you have a lab appointment with the [facility] please come in thru the  Main Entrance and check in at the main information desk  You need to re-schedule your appointment should you arrive 10 or more minutes late.  We strive to give you quality time with our providers, and arriving late affects you and other patients whose appointments are after yours.  Also, if you no show three or more times for appointments you may be dismissed from the clinic at the providers discretion.     Again, thank you for choosing [facility].  Our hope is that these requests will decrease the amount of time that you wait before being seen by our physicians.       _____________________________________________________________  Should you have questions after your visit to [facility], please contact our office at [phone] between the hours of 8:00 a.m. and 4:30 p.m.  Voicemails left after 4:00 p.m. will not be returned until the following business day.  For prescription refill requests, have your pharmacy contact our office and allow 72 hours.    Cancer Center Support Programs:   > Cancer Support Group  2nd Tuesday of the month 1pm-2pm, Journey Room

## 2019-06-10 DIAGNOSIS — Z992 Dependence on renal dialysis: Secondary | ICD-10-CM | POA: Diagnosis not present

## 2019-06-10 DIAGNOSIS — Z23 Encounter for immunization: Secondary | ICD-10-CM | POA: Diagnosis not present

## 2019-06-10 DIAGNOSIS — N186 End stage renal disease: Secondary | ICD-10-CM | POA: Diagnosis not present

## 2019-06-10 DIAGNOSIS — N2581 Secondary hyperparathyroidism of renal origin: Secondary | ICD-10-CM | POA: Diagnosis not present

## 2019-06-10 DIAGNOSIS — D631 Anemia in chronic kidney disease: Secondary | ICD-10-CM | POA: Diagnosis not present

## 2019-06-10 DIAGNOSIS — D509 Iron deficiency anemia, unspecified: Secondary | ICD-10-CM | POA: Diagnosis not present

## 2019-06-11 ENCOUNTER — Other Ambulatory Visit: Payer: Self-pay

## 2019-06-12 DIAGNOSIS — D631 Anemia in chronic kidney disease: Secondary | ICD-10-CM | POA: Diagnosis not present

## 2019-06-12 DIAGNOSIS — N186 End stage renal disease: Secondary | ICD-10-CM | POA: Diagnosis not present

## 2019-06-12 DIAGNOSIS — N2581 Secondary hyperparathyroidism of renal origin: Secondary | ICD-10-CM | POA: Diagnosis not present

## 2019-06-12 DIAGNOSIS — Z23 Encounter for immunization: Secondary | ICD-10-CM | POA: Diagnosis not present

## 2019-06-12 DIAGNOSIS — D509 Iron deficiency anemia, unspecified: Secondary | ICD-10-CM | POA: Diagnosis not present

## 2019-06-12 DIAGNOSIS — Z992 Dependence on renal dialysis: Secondary | ICD-10-CM | POA: Diagnosis not present

## 2019-06-14 DIAGNOSIS — Z23 Encounter for immunization: Secondary | ICD-10-CM | POA: Diagnosis not present

## 2019-06-14 DIAGNOSIS — N2581 Secondary hyperparathyroidism of renal origin: Secondary | ICD-10-CM | POA: Diagnosis not present

## 2019-06-14 DIAGNOSIS — N186 End stage renal disease: Secondary | ICD-10-CM | POA: Diagnosis not present

## 2019-06-14 DIAGNOSIS — Z992 Dependence on renal dialysis: Secondary | ICD-10-CM | POA: Diagnosis not present

## 2019-06-14 DIAGNOSIS — D509 Iron deficiency anemia, unspecified: Secondary | ICD-10-CM | POA: Diagnosis not present

## 2019-06-14 DIAGNOSIS — D631 Anemia in chronic kidney disease: Secondary | ICD-10-CM | POA: Diagnosis not present

## 2019-06-16 DIAGNOSIS — Z992 Dependence on renal dialysis: Secondary | ICD-10-CM | POA: Diagnosis not present

## 2019-06-16 DIAGNOSIS — N186 End stage renal disease: Secondary | ICD-10-CM | POA: Diagnosis not present

## 2019-06-17 DIAGNOSIS — Z23 Encounter for immunization: Secondary | ICD-10-CM | POA: Diagnosis not present

## 2019-06-17 DIAGNOSIS — N186 End stage renal disease: Secondary | ICD-10-CM | POA: Diagnosis not present

## 2019-06-17 DIAGNOSIS — N2581 Secondary hyperparathyroidism of renal origin: Secondary | ICD-10-CM | POA: Diagnosis not present

## 2019-06-17 DIAGNOSIS — D509 Iron deficiency anemia, unspecified: Secondary | ICD-10-CM | POA: Diagnosis not present

## 2019-06-17 DIAGNOSIS — Z992 Dependence on renal dialysis: Secondary | ICD-10-CM | POA: Diagnosis not present

## 2019-06-18 ENCOUNTER — Other Ambulatory Visit: Payer: Self-pay

## 2019-06-18 ENCOUNTER — Other Ambulatory Visit (HOSPITAL_COMMUNITY)
Admission: RE | Admit: 2019-06-18 | Discharge: 2019-06-18 | Disposition: A | Discharge status: Home / Self Care | Payer: Medicare Other | Source: Ambulatory Visit | Attending: Vascular Surgery | Admitting: Vascular Surgery

## 2019-06-18 DIAGNOSIS — Z20828 Contact with and (suspected) exposure to other viral communicable diseases: Secondary | ICD-10-CM | POA: Insufficient documentation

## 2019-06-18 DIAGNOSIS — Z01812 Encounter for preprocedural laboratory examination: Secondary | ICD-10-CM | POA: Insufficient documentation

## 2019-06-18 LAB — SARS CORONAVIRUS 2 (TAT 6-24 HRS): SARS Coronavirus 2: NEGATIVE

## 2019-06-20 ENCOUNTER — Ambulatory Visit (HOSPITAL_COMMUNITY)
Admission: RE | Admit: 2019-06-20 | Discharge: 2019-06-20 | Disposition: A | Discharge status: Home / Self Care | Payer: Medicare Other | Attending: Vascular Surgery | Admitting: Vascular Surgery

## 2019-06-20 ENCOUNTER — Encounter (HOSPITAL_COMMUNITY)
Admission: RE | Disposition: A | Discharge status: Home / Self Care | Payer: Self-pay | Source: Home / Self Care | Attending: Vascular Surgery

## 2019-06-20 ENCOUNTER — Other Ambulatory Visit: Payer: Self-pay

## 2019-06-20 DIAGNOSIS — Y841 Kidney dialysis as the cause of abnormal reaction of the patient, or of later complication, without mention of misadventure at the time of the procedure: Secondary | ICD-10-CM | POA: Insufficient documentation

## 2019-06-20 DIAGNOSIS — N184 Chronic kidney disease, stage 4 (severe): Secondary | ICD-10-CM | POA: Insufficient documentation

## 2019-06-20 DIAGNOSIS — N186 End stage renal disease: Secondary | ICD-10-CM

## 2019-06-20 DIAGNOSIS — T82858A Stenosis of vascular prosthetic devices, implants and grafts, initial encounter: Secondary | ICD-10-CM | POA: Insufficient documentation

## 2019-06-20 DIAGNOSIS — Z992 Dependence on renal dialysis: Secondary | ICD-10-CM

## 2019-06-20 DIAGNOSIS — T82898A Other specified complication of vascular prosthetic devices, implants and grafts, initial encounter: Secondary | ICD-10-CM

## 2019-06-20 DIAGNOSIS — E785 Hyperlipidemia, unspecified: Secondary | ICD-10-CM | POA: Diagnosis not present

## 2019-06-20 DIAGNOSIS — I130 Hypertensive heart and chronic kidney disease with heart failure and stage 1 through stage 4 chronic kidney disease, or unspecified chronic kidney disease: Secondary | ICD-10-CM | POA: Insufficient documentation

## 2019-06-20 HISTORY — PX: PERIPHERAL VASCULAR BALLOON ANGIOPLASTY: CATH118281

## 2019-06-20 LAB — POCT I-STAT, CHEM 8
BUN: 21 mg/dL (ref 8–23)
Calcium, Ion: 1.11 mmol/L — ABNORMAL LOW (ref 1.15–1.40)
Chloride: 97 mmol/L — ABNORMAL LOW (ref 98–111)
Creatinine, Ser: 3.6 mg/dL — ABNORMAL HIGH (ref 0.61–1.24)
Glucose, Bld: 103 mg/dL — ABNORMAL HIGH (ref 70–99)
HCT: 22 % — ABNORMAL LOW (ref 39.0–52.0)
Hemoglobin: 7.5 g/dL — ABNORMAL LOW (ref 13.0–17.0)
Potassium: 2.8 mmol/L — ABNORMAL LOW (ref 3.5–5.1)
Sodium: 140 mmol/L (ref 135–145)
TCO2: 31 mmol/L (ref 22–32)

## 2019-06-20 SURGERY — PERIPHERAL VASCULAR BALLOON ANGIOPLASTY
Anesthesia: LOCAL

## 2019-06-20 MED ORDER — SODIUM CHLORIDE 0.9% FLUSH: 3.0000 mL | Freq: Two times a day (BID) | INTRAVENOUS | Status: DC

## 2019-06-20 MED ORDER — SODIUM CHLORIDE 0.9 % IV SOLN: 250.0000 mL | INTRAVENOUS | Status: DC | PRN

## 2019-06-20 MED ORDER — SODIUM CHLORIDE 0.9% FLUSH: 3.0000 mL | INTRAVENOUS | Status: DC | PRN

## 2019-06-20 MED ORDER — LABETALOL HCL 5 MG/ML IV SOLN: 10.0000 mg | INTRAVENOUS | Status: DC | PRN

## 2019-06-20 MED ORDER — ACETAMINOPHEN 325 MG PO TABS: 650.0000 mg | ORAL_TABLET | ORAL | Status: DC | PRN

## 2019-06-20 MED ORDER — IODIXANOL 320 MG/ML IV SOLN
INTRAVENOUS | Status: DC | PRN
  Administered 2019-06-20: 47 mL via INTRAVENOUS

## 2019-06-20 MED ORDER — HEPARIN SODIUM (PORCINE) 1000 UNIT/ML IJ SOLN
INTRAMUSCULAR | Status: DC | PRN
  Administered 2019-06-20: 3000 [IU] via INTRAVENOUS

## 2019-06-20 MED ORDER — LIDOCAINE HCL (PF) 1 % IJ SOLN
INTRAMUSCULAR | Status: DC | PRN
  Administered 2019-06-20: 2 mL via INTRADERMAL

## 2019-06-20 MED ORDER — HEPARIN (PORCINE) IN NACL 1000-0.9 UT/500ML-% IV SOLN
INTRAVENOUS | Status: AC
  Filled 2019-06-20: qty 500

## 2019-06-20 MED ORDER — ONDANSETRON HCL 4 MG/2ML IJ SOLN: 4.0000 mg | Freq: Four times a day (QID) | INTRAMUSCULAR | Status: DC | PRN

## 2019-06-20 MED ORDER — HEPARIN (PORCINE) IN NACL 1000-0.9 UT/500ML-% IV SOLN
INTRAVENOUS | Status: DC | PRN
  Administered 2019-06-20: 500 mL

## 2019-06-20 MED ORDER — LIDOCAINE HCL (PF) 1 % IJ SOLN
INTRAMUSCULAR | Status: AC
  Filled 2019-06-20: qty 30

## 2019-06-20 MED ORDER — HYDRALAZINE HCL 20 MG/ML IJ SOLN: 5.0000 mg | INTRAMUSCULAR | Status: DC | PRN

## 2019-06-20 SURGICAL SUPPLY — 14 items
BAG SNAP BAND KOVER 36X36 (MISCELLANEOUS) ×3 IMPLANT
BALLN MUSTANG 9X60X75 (BALLOONS) ×6
BALLOON MUSTANG 9X60X75 (BALLOONS) ×4 IMPLANT
COVER DOME SNAP 22 D (MISCELLANEOUS) ×3 IMPLANT
KIT ENCORE 26 ADVANTAGE (KITS) ×3 IMPLANT
KIT MICROPUNCTURE NIT STIFF (SHEATH) ×3 IMPLANT
PROTECTION STATION PRESSURIZED (MISCELLANEOUS) ×3
SHEATH PINNACLE R/O II 7F 4CM (SHEATH) ×6 IMPLANT
SHEATH PROBE COVER 6X72 (BAG) ×3 IMPLANT
STATION PROTECTION PRESSURIZED (MISCELLANEOUS) ×2 IMPLANT
STOPCOCK MORSE 400PSI 3WAY (MISCELLANEOUS) ×3 IMPLANT
TRAY PV CATH (CUSTOM PROCEDURE TRAY) ×3 IMPLANT
TUBING CIL FLEX 10 FLL-RA (TUBING) ×3 IMPLANT
WIRE BENTSON .035X145CM (WIRE) ×3 IMPLANT

## 2019-06-20 NOTE — Op Note (Signed)
Procedure: Left arm fistulogram with balloon angioplasty of left arm AV fistula  Preoperative diagnosis: Poorly functioning left arm AV fistula  Postoperative diagnosis: Same  Anesthesia: Local  Operative findings: 2 areas of probably retained valves mid fistula angioplastied with a 9 x 6 mm angioplasty balloon with residual 10% stenosis  Operative details: After obtaining form consent, the patient taken to the [place] lab.  The patient was placed in supine position angio table.  Left upper extremities prepped and draped in usual sterile fashion.  Ultrasound was used to identify the patient's left upper arm AV fistula.  It was patent.  Image was obtained for the patient's medical record.  Local anesthesia was demonstrated over the fistula and a micropuncture needle was used to cannulate the fistula using ultrasound guidance.  Micropuncture guidewire was advanced into the fistula.  Micropuncture sheath was placed over this.  Contrast angiogram was then obtained.  Central veins are all widely patent including the superior vena cava innominate left subclavian left axillary vein.  Distal portion of the fistula was then injected with contrast and there were 2 discrete lesions about mid fistula that were about 50% stenosis.  Patient was given 3000 units of intravenous heparin.  A measurement of the fistula showed it was about 13 mm in diameter.  Therefore a 9 x 6 balloon was selected.  This was inflated to nominal pressure to 8 atm for 1 minute after centering on the lesion.  Contrast angiogram showed minimal residual stenosis.  At this point with a blood pressure cuff inflated on the upper arm contrast was injected to reflux across the arterial anastomosis.  This was widely patent with no significant narrowing.  At this point the procedure was concluded the sheath was removed and the insertion site closed with a figure-of-eight Monocryl stitch.  Patient tolerated procedure well and there were no  complications.  Operative management: Status post angioplasty left upper arm AV fistula.  The fistula does have several large side branches.  If there is continued difficulty cannulation of the fistula then consideration could be given for ligating the side branches to improve overall flow.  However, the fistula is 13 mm in diameter in its midportion and should be adequate for access.  Ruta Hinds, MD [facility] Office: [phone]

## 2019-06-20 NOTE — H&P (Signed)
[facility] SHORT STAY H&P  CC:  Left arm access poor function   HPI: Pt with high pressure and prolonged bleeding from left arm access.  Past Medical History:  Diagnosis Date  . Anemia   . Cancer [facility])    right renal . Prostate- Radiation treatment  . CHF (congestive heart failure) ([place])   . Chronic kidney disease    stage 4  . Edema   . Heart murmur    "nothing to worry about"  . Hyperlipidemia   . Hypertension   . Malignant carcinoid tumor ([place]) 01/03/2016    FH:  Non-Contributory  Social HX Social History   Tobacco Use  . Smoking status: Never Smoker  . Smokeless tobacco: Never Used  Substance Use Topics  . Alcohol use: No    Alcohol/week: 0.0 standard drinks  . Drug use: No    Allergies No Known Allergies  Medications Current Facility-Administered Medications  Medication Dose Route Frequency Provider Last Rate Last Dose  . 0.9 %  sodium chloride infusion  250 mL Intravenous PRN Elam Dutch, MD      . sodium chloride flush (NS) 0.9 % injection 3 mL  3 mL Intravenous Q12H Fields, Charles E, MD      . sodium chloride flush (NS) 0.9 % injection 3 mL  3 mL Intravenous PRN Elam Dutch, MD        PHYSICAL EXAM  Vitals:   06/20/19 1028  BP: 114/70  Pulse: 65  Resp: 18  Temp: (!) 97.2 F (36.2 C)  SpO2: 97%    General:  WDWN in NAD HENT: WNL Eyes: Pupils equal Pulmonary: normal non-labored breathing , without Rales, rhonchi,  wheezing Cardiac: RRR, Vascular Exam/Pulses: left arm access  Extremities without ischemic changes, no Gangrene , no cellulitis; no open wounds;  Neuro A&O x 3; good sensation; motion in all extremities  Impression: Poor function left arm access  Plan: Fistulogram possible intervention today Ruta Hinds @TODAY @ 12:48 PM

## 2019-06-20 NOTE — Discharge Instructions (Signed)

## 2019-06-23 ENCOUNTER — Other Ambulatory Visit (HOSPITAL_COMMUNITY): Payer: Medicare Other

## 2019-06-23 ENCOUNTER — Encounter (HOSPITAL_COMMUNITY): Payer: Self-pay | Admitting: Vascular Surgery

## 2019-06-23 ENCOUNTER — Inpatient Hospital Stay (HOSPITAL_COMMUNITY): Payer: Medicare Other

## 2019-06-23 ENCOUNTER — Inpatient Hospital Stay (HOSPITAL_COMMUNITY): Payer: Medicare Other | Attending: Hematology | Admitting: Hematology

## 2019-06-23 ENCOUNTER — Other Ambulatory Visit: Payer: Self-pay

## 2019-06-23 VITALS — BP 118/68 | HR 71 | Temp 96.9°F | Resp 16 | Wt 189.3 lb

## 2019-06-23 DIAGNOSIS — C7A00 Malignant carcinoid tumor of unspecified site: Secondary | ICD-10-CM

## 2019-06-23 DIAGNOSIS — D509 Iron deficiency anemia, unspecified: Secondary | ICD-10-CM

## 2019-06-23 DIAGNOSIS — C7B02 Secondary carcinoid tumors of liver: Secondary | ICD-10-CM | POA: Diagnosis not present

## 2019-06-23 DIAGNOSIS — D649 Anemia, unspecified: Secondary | ICD-10-CM | POA: Insufficient documentation

## 2019-06-23 DIAGNOSIS — Z992 Dependence on renal dialysis: Secondary | ICD-10-CM | POA: Insufficient documentation

## 2019-06-23 DIAGNOSIS — N186 End stage renal disease: Secondary | ICD-10-CM | POA: Diagnosis not present

## 2019-06-23 DIAGNOSIS — D631 Anemia in chronic kidney disease: Secondary | ICD-10-CM

## 2019-06-23 LAB — CBC
HCT: 24 % — ABNORMAL LOW (ref 39.0–52.0)
Hemoglobin: 7.2 g/dL — ABNORMAL LOW (ref 13.0–17.0)
MCH: 27.3 pg (ref 26.0–34.0)
MCHC: 30 g/dL (ref 30.0–36.0)
MCV: 90.9 fL (ref 80.0–100.0)
Platelets: 158 10*3/uL (ref 150–400)
RBC: 2.64 MIL/uL — ABNORMAL LOW (ref 4.22–5.81)
RDW: 18.4 % — ABNORMAL HIGH (ref 11.5–15.5)
WBC: 4.9 10*3/uL (ref 4.0–10.5)
nRBC: 0 % (ref 0.0–0.2)

## 2019-06-23 LAB — PREPARE RBC (CROSSMATCH)

## 2019-06-23 MED ORDER — EPOETIN ALFA-EPBX 10000 UNIT/ML IJ SOLN: 20000.0000 [IU] | Freq: Once | INTRAMUSCULAR | Status: DC

## 2019-06-23 NOTE — Progress Notes (Signed)
Patient presents today for follow up with Dr. Delton Coombes and Retacrit injection. HGB today 7.2. Message received from [facility] LPN hold Retacrit today per Dr. Delton Coombes and patient will be added on to receive one unit of blood tomorrow.

## 2019-06-23 NOTE — Progress Notes (Signed)
[place], [place] [address]   CLINIC:  Medical Oncology/Hematology  PCP:  [name], MD [place] [id number] [phone]   REASON FOR VISIT:  Follow-up for metastatic carcinoid tumor  CURRENT THERAPY: Sandostatin monthly.  Everolimus on hold.     INTERVAL HISTORY:  Allen Davenport 78 y.o. male seen for follow-up of metastatic carcinoid to the liver and peritoneum.  He is also being worked up for anemia.  Appetite and energy levels are 75%.  Complains of leg swelling which is worsening.  Apparently his Lasix is on hold at this time.  He had received Sandostatin 2 weeks ago.  Denies any bleeding per rectum or melena.  No fevers or chills noted.  He undergoes dialysis on Tuesday, Thursday and Saturday.     REVIEW OF SYSTEMS:  Review of Systems  Cardiovascular: Positive for leg swelling.  All other systems reviewed and are negative.    PAST MEDICAL/SURGICAL HISTORY:  Past Medical History:  Diagnosis Date  . Anemia   . Cancer [facility])    right renal . Prostate- Radiation treatment  . CHF (congestive heart failure) ([place])   . Chronic kidney disease    stage 4  . Edema   . Heart murmur    "nothing to worry about"  . Hyperlipidemia   . Hypertension   . Malignant carcinoid tumor ([place]) 01/03/2016   Past Surgical History:  Procedure Laterality Date  . AV FISTULA PLACEMENT Left 10/20/2014   Procedure: Left Arm ARTERIOVENOUS (AV) FISTULA CREATION;  Surgeon: Elam Dutch, MD;  Location: [facility];  Service: Vascular;  Laterality: Left;  . NEPHRECTOMY Right 2010  . PERIPHERAL VASCULAR BALLOON ANGIOPLASTY  06/20/2019   Procedure: PERIPHERAL VASCULAR BALLOON ANGIOPLASTY;  Surgeon: Elam Dutch, MD;  Location: [facility];  Service: Cardiovascular;;     SOCIAL HISTORY:  Social History   Socioeconomic History  . Marital status: Married    Spouse name: Not on file  . Number of children: Not on file  . Years  of education: Not on file  . Highest education level: Not on file  Occupational History  . Not on file  Social Needs  . Financial resource strain: Not on file  . Food insecurity    Worry: Not on file    Inability: Not on file  . Transportation needs    Medical: Not on file    Non-medical: Not on file  Tobacco Use  . Smoking status: Never Smoker  . Smokeless tobacco: Never Used  Substance and Sexual Activity  . Alcohol use: No    Alcohol/week: 0.0 standard drinks  . Drug use: No  . Sexual activity: Not on file  Lifestyle  . Physical activity    Days per week: Not on file    Minutes per session: Not on file  . Stress: Not on file  Relationships  . Social Herbalist on phone: Not on file    Gets together: Not on file    Attends religious service: Not on file    Active member of club or organization: Not on file    Attends meetings of clubs or organizations: Not on file    Relationship status: Not on file  . Intimate partner violence    Fear of current or ex partner: Not on file    Emotionally abused: Not on file    Physically abused: Not on file  Forced sexual activity: Not on file  Other Topics Concern  . Not on file  Social History Narrative  . Not on file    FAMILY HISTORY:  Family History  Problem Relation Age of Onset  . Cancer Mother   . Hypertension Father   . Cancer Sister     CURRENT MEDICATIONS:  Outpatient Encounter Medications as of 06/23/2019  Medication Sig Note  . alendronate (FOSAMAX) 70 MG tablet TAKE ONE TABLET BY MOUTH ONCE A WEEK IN THE MORNING WITH A FULL GLASS OF WATER ON AN EMPTY STOMACH. DO NOT LAY DOWN FOR 30 MINUTES.   [name] atorvastatin (LIPITOR) 80 MG tablet Take 80 mg by mouth daily at 6 PM.    . calcitRIOL (ROCALTROL) 0.25 MCG capsule Take 0.25 mcg by mouth daily.    . carvedilol (COREG) 3.125 MG tablet Take 3.125 mg by mouth 2 (two) times daily with a meal.    . Cholecalciferol (VITAMIN D3) 250 MCG (10000 UT) TABS Take by  mouth.   . dexamethasone (DECADRON) 0.5 MG/5ML solution SWISH TEN ML BY MOUTH FOUR TIMES DAILY FOR TWO MINUTES THEN SPIT   . epoetin alfa (EPOGEN) 4000 UNIT/ML injection Inject into the skin.   [name] iron polysaccharides (NIFEREX) 150 MG capsule Take by mouth.   . isoniazid (NYDRAZID) 300 MG tablet Take 1 tablet (300 mg total) by mouth daily.   . metolazone (ZAROXOLYN) 5 MG tablet Take 5 mg by mouth every other day.   [name] octreotide (SANDOSTATIN LAR DEPOT) 30 MG injection Inject into the muscle.   [name] octreotide (SANDOSTATIN LAR) 30 MG injection Inject into the muscle.   . Omega-3 Fatty Acids (FISH OIL) 1000 MG CAPS Take 1 capsule by mouth daily.   [name] pyridOXINE (B-6) 50 MG tablet Take 1 tablet (50 mg total) by mouth daily.   . sodium bicarbonate 650 MG tablet Take 650 mg by mouth daily.   . tamsulosin (FLOMAX) 0.4 MG CAPS capsule Take 0.4 mg by mouth daily.  04/26/2016: Received from: External Pharmacy   No facility-administered encounter medications on file as of 06/23/2019.     ALLERGIES:  No Known Allergies   PHYSICAL EXAM:  ECOG Performance status: 1  Vitals:   06/23/19 1450  BP: 118/68  Pulse: 71  Resp: 16  Temp: (!) 96.9 F (36.1 C)  SpO2: 99%   Filed Weights   06/23/19 1450  Weight: 189 lb 4.8 oz (85.9 kg)    Physical Exam Vitals signs reviewed.  Constitutional:      Appearance: Normal appearance.  Cardiovascular:     Rate and Rhythm: Normal rate and regular rhythm.     Heart sounds: Normal heart sounds.  Pulmonary:     Effort: Pulmonary effort is normal.     Breath sounds: Normal breath sounds.  Abdominal:     General: There is no distension.     Palpations: Abdomen is soft. There is no mass.  Musculoskeletal:        General: No swelling.  Skin:    General: Skin is warm.  Neurological:     General: No focal deficit present.     Mental Status: He is alert and oriented to person, place, and time.  Psychiatric:        Mood and Affect: Mood normal.         Behavior: Behavior normal.      LABORATORY DATA:  I have reviewed the labs as listed.  CBC    Component Value Date/Time  WBC 4.9 06/23/2019 1425   RBC 2.64 (L) 06/23/2019 1425   HGB 7.2 (L) 06/23/2019 1425   HCT 24.0 (L) 06/23/2019 1425   PLT 158 06/23/2019 1425   MCV 90.9 06/23/2019 1425   MCH 27.3 06/23/2019 1425   MCHC 30.0 06/23/2019 1425   RDW 18.4 (H) 06/23/2019 1425   LYMPHSABS 224 (L) 06/04/2019 1449   MONOABS 0.6 05/09/2019 1049   EOSABS 72 06/04/2019 1449   BASOSABS 20 06/04/2019 1449   CMP Latest Ref Rng & Units 06/20/2019 06/08/2019 06/07/2019  Glucose 70 - 99 mg/dL 103(H) 109(H) 133(H)  BUN 8 - 23 mg/dL 21 16 34(H)  Creatinine 0.61 - 1.24 mg/dL 3.60(H) 3.97(H) 6.32(H)  Sodium 135 - 145 mmol/L 140 136 137  Potassium 3.5 - 5.1 mmol/L 2.8(L) 3.1(L) 2.7(LL)  Chloride 98 - 111 mmol/L 97(L) 99 98  CO2 22 - 32 mmol/L - 28 28  Calcium 8.9 - 10.3 mg/dL - 7.0(L) 7.0(L)  Total Protein 6.1 - 8.1 g/dL - - -  Total Bilirubin 0.2 - 1.2 mg/dL - - -  Alkaline Phos 38 - 126 U/L - - -  AST 10 - 35 U/L - - -  ALT 9 - 46 U/L - - -       DIAGNOSTIC IMAGING:  I have independently reviewed the scans and discussed with the patient.   I have reviewed Venita Lick LPN's note and agree with the documentation.  I personally performed a face-to-face visit, made revisions and my assessment and plan is as follows.    ASSESSMENT & PLAN:   Malignant carcinoid tumor ([place]) 1.  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  Initially managed at [facility] center.  Transferred care to [facility] in October 2017. - Gallium-68 scan on 10/23/2017 shows metastatic disease in the liver, central right mesenteric mass and multiple peritoneal deposits. -Gallium-68 dotatate PET scan on 04/22/2018 showed multiple foci of intense radiotracer activity consistent with metastatic well-differentiated carcinoid tumor.  Metastatic sites include  central mesentery, multiple peritoneal implants, liver metastasis.  Several metastatic lesions have measured higher activity.  Additional mild increase in size of central mesenteric mass.  Lesion within the right ventricle along the interventricular septum thought to be metastatic. - 2D echo on 05/03/2018 showed normal cavity size of the right ventricle with normal systolic function.  -Everolimus 5 mg daily started on 05/17/2018 along with dexamethasone mouthwash. - Gallium-68 dotatate scan on 11/27/2018 shows overall stable well-differentiated neuroendocrine tumor metastasis at multiple sites in the liver, peritoneum and potentially heart.  Stable activity of liver lesions.  Decrease in size and activity of the right central mesenteric mass.  Decrease in activity of peritoneal implants. - Restaging gallium-68 dotatate scan on April 08, 2019 revealed no new evidence of disease progression.  Overall his exam was stable compared to prior.  There was a new nodularity in the right lung base that was non-FDG avid. -He was admitted to the hospital on 06/07/2019 with a hemoglobin of 5.7.  He received blood transfusion. -He denies any bleeding per rectum or melena.  Likely etiology is bone marrow suppression from everolimus. -Everolimus has been on hold since 06/07/2019. -Last Sandostatin injection was on 06/09/2019. -Hemoglobin today 7.2.  As his hemoglobin is low we will continue to hold everolimus.  2.  Normocytic anemia: -He was receiving Procrit injections previously.  However he was started on hemodialysis on 05/20/2019. -Last Feraheme infusion was on 06/07/2019. -Last  Procrit was on 06/09/2019.  He denies any bleeding per rectum or melena.  His hemoglobin continues to drop. -We reviewed labs which showed normal U01 and folic acid.  Ferritin was 767 with percent saturation of 9. -It is unclear if he is receiving Procrit with dialysis.  We will obtain records from [facility]. -Hemoglobin today 7.2.  We  will schedule him for a blood transfusion tomorrow. -If there is no improvement in blood, will consider bone marrow biopsy.  3.  ESRD on HD: -He is reportedly started on hemodialysis on 05/20/2019. -He is being dialyzed on Tuesday, Thursday and Saturday at [facility] in [place]. -He is complaining of lower extremity swelling.  He is putting out urine.  He was told to start back on Lasix 40 mg daily as needed.     Total time spent is 25 minutes with more than 50% of the time spent face-to-face discussing and reviewing hospital records, treatment plan, counseling and coordination of care.  Orders placed this encounter:  Orders Placed This Encounter  Procedures  . CBC with Differential  . Reticulocytes  . Lactate dehydrogenase  . Practitioner attestation of consent  . Complete patient signature process for consent form  . Care order/instruction  . Type and screen  . Prepare RBC      Derek Jack, MD [place] [phone]

## 2019-06-23 NOTE — Patient Instructions (Signed)
[facility] at [facility] Discharge Instructions  You were seen today by Dr. Delton Coombes. He went over your recent lab results. He will schedule you for 1 unit of blood tomorrow morning. He will see you back in 1 week for labs and follow up.  You can start taking the fluid pill (LASIX) again.   Thank you for choosing [facility] at [facility] to provide your oncology and hematology care.  To afford each patient quality time with our provider, please arrive at least 15 minutes before your scheduled appointment time.   If you have a lab appointment with the [facility] please come in thru the  Main Entrance and check in at the main information desk  You need to re-schedule your appointment should you arrive 10 or more minutes late.  We strive to give you quality time with our providers, and arriving late affects you and other patients whose appointments are after yours.  Also, if you no show three or more times for appointments you may be dismissed from the clinic at the providers discretion.     Again, thank you for choosing [facility].  Our hope is that these requests will decrease the amount of time that you wait before being seen by our physicians.       _____________________________________________________________  Should you have questions after your visit to [facility], please contact our office at [phone] between the hours of 8:00 a.m. and 4:30 p.m.  Voicemails left after 4:00 p.m. will not be returned until the following business day.  For prescription refill requests, have your pharmacy contact our office and allow 72 hours.    Cancer Center Support Programs:   > Cancer Support Group  2nd Tuesday of the month 1pm-2pm, Journey Room

## 2019-06-23 NOTE — Assessment & Plan Note (Addendum)
1.  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  Initially managed at [facility].  Transferred care to [facility] in October 2017. - Gallium-68 scan on 10/23/2017 shows metastatic disease in the liver, central right mesenteric mass and multiple peritoneal deposits. -Gallium-68 dotatate PET scan on 04/22/2018 showed multiple foci of intense radiotracer activity consistent with metastatic well-differentiated carcinoid tumor.  Metastatic sites include central mesentery, multiple peritoneal implants, liver metastasis.  Several metastatic lesions have measured higher activity.  Additional mild increase in size of central mesenteric mass.  Lesion within the right ventricle along the interventricular septum thought to be metastatic. - 2D echo on 05/03/2018 showed normal cavity size of the right ventricle with normal systolic function.  -Everolimus 5 mg daily started on 05/17/2018 along with dexamethasone mouthwash. - Gallium-68 dotatate scan on 11/27/2018 shows overall stable well-differentiated neuroendocrine tumor metastasis at multiple sites in the liver, peritoneum and potentially heart.  Stable activity of liver lesions.  Decrease in size and activity of the right central mesenteric mass.  Decrease in activity of peritoneal implants. - Restaging gallium-68 dotatate scan on April 08, 2019 revealed no new evidence of disease progression.  Overall his exam was stable compared to prior.  There was a new nodularity in the right lung base that was non-FDG avid. -He was admitted to the hospital on 06/07/2019 with a hemoglobin of 5.7.  He received blood transfusion. -He denies any bleeding per rectum or melena.  Likely etiology is bone marrow suppression from everolimus. -Everolimus has been on hold since 06/07/2019. -Last Sandostatin injection was on 06/09/2019. -Hemoglobin today 7.2.  As his hemoglobin is low we will continue to hold  everolimus.  2.  Normocytic anemia: -He was receiving Procrit injections previously.  However he was started on hemodialysis on 05/20/2019. -Last Feraheme infusion was on 06/07/2019. -Last Procrit was on 06/09/2019.  He denies any bleeding per rectum or melena.  His hemoglobin continues to drop. -We reviewed labs which showed normal I71 and folic acid.  Ferritin was 767 with percent saturation of 9. -It is unclear if he is receiving Procrit with dialysis.  We will obtain records from [facility]. -Hemoglobin today 7.2.  We will schedule him for a blood transfusion tomorrow. -If there is no improvement in blood, will consider bone marrow biopsy.  3.  ESRD on HD: -He is reportedly started on hemodialysis on 05/20/2019. -He is being dialyzed on Tuesday, Thursday and Saturday at [facility] in [place]. -He is complaining of lower extremity swelling.  He is putting out urine.  He was told to start back on Lasix 40 mg daily as needed.

## 2019-06-24 ENCOUNTER — Encounter (HOSPITAL_COMMUNITY): Payer: Self-pay

## 2019-06-24 ENCOUNTER — Inpatient Hospital Stay (HOSPITAL_COMMUNITY): Payer: Medicare Other

## 2019-06-24 DIAGNOSIS — C7A00 Malignant carcinoid tumor of unspecified site: Secondary | ICD-10-CM

## 2019-06-24 DIAGNOSIS — Z23 Encounter for immunization: Secondary | ICD-10-CM | POA: Diagnosis not present

## 2019-06-24 DIAGNOSIS — N2581 Secondary hyperparathyroidism of renal origin: Secondary | ICD-10-CM | POA: Diagnosis not present

## 2019-06-24 DIAGNOSIS — D509 Iron deficiency anemia, unspecified: Secondary | ICD-10-CM | POA: Diagnosis not present

## 2019-06-24 DIAGNOSIS — D649 Anemia, unspecified: Secondary | ICD-10-CM | POA: Diagnosis not present

## 2019-06-24 DIAGNOSIS — C7B02 Secondary carcinoid tumors of liver: Secondary | ICD-10-CM | POA: Diagnosis not present

## 2019-06-24 DIAGNOSIS — Z992 Dependence on renal dialysis: Secondary | ICD-10-CM | POA: Diagnosis not present

## 2019-06-24 DIAGNOSIS — N186 End stage renal disease: Secondary | ICD-10-CM | POA: Diagnosis not present

## 2019-06-24 MED ORDER — DIPHENHYDRAMINE HCL 25 MG PO CAPS
25.0000 mg | ORAL_CAPSULE | Freq: Once | ORAL | Status: AC
  Administered 2019-06-24: 08:00:00 25 mg via ORAL
  Filled 2019-06-24: qty 1

## 2019-06-24 MED ORDER — ACETAMINOPHEN 325 MG PO TABS
650.0000 mg | ORAL_TABLET | Freq: Once | ORAL | Status: AC
  Administered 2019-06-24: 650 mg via ORAL
  Filled 2019-06-24: qty 2

## 2019-06-24 MED ORDER — SODIUM CHLORIDE 0.9% IV SOLUTION
250.0000 mL | Freq: Once | INTRAVENOUS | Status: AC
  Administered 2019-06-24: 250 mL via INTRAVENOUS

## 2019-06-24 NOTE — Patient Instructions (Signed)
[facility] at [facility] Discharge Instructions  Received 1 unit of PRBC's today. Follow-up as scheduled. Call clinic for any questions or concerns   Thank you for choosing [facility] at [facility] to provide your oncology and hematology care.  To afford each patient quality time with our provider, please arrive at least 15 minutes before your scheduled appointment time.   If you have a lab appointment with the Cancer Center please come in thru the Main Entrance and check in at the main information desk.  You need to re-schedule your appointment should you arrive 10 or more minutes late.  We strive to give you quality time with our providers, and arriving late affects you and other patients whose appointments are after yours.  Also, if you no show three or more times for appointments you may be dismissed from the clinic at the providers discretion.     Again, thank you for choosing [facility].  Our hope is that these requests will decrease the amount of time that you wait before being seen by our physicians.       _____________________________________________________________  Should you have questions after your visit to [facility], please contact our office at [phone] between the hours of 8:00 a.m. and 4:30 p.m.  Voicemails left after 4:00 p.m. will not be returned until the following business day.  For prescription refill requests, have your pharmacy contact our office and allow 72 hours.    Due to Covid, you will need to wear a mask upon entering the hospital. If you do not have a mask, a mask will be given to you at the Main Entrance upon arrival. For doctor visits, patients may have 1 support person with them. For treatment visits, patients can not have anyone with them due to social distancing guidelines and our immunocompromised population.     

## 2019-06-24 NOTE — Progress Notes (Signed)
Allen Davenport tolerated blood transfusion well without complaints or incident. Peripheral IV site checked with positive blood return prior to and after transfusion. VSS upon discharge Pt discharged self ambulatory in satisfactory condition

## 2019-06-25 LAB — BPAM RBC
Blood Product Expiration Date: 202101052359
ISSUE DATE / TIME: 202012080848
Unit Type and Rh: 5100

## 2019-06-25 LAB — TYPE AND SCREEN
ABO/RH(D): O POS
Antibody Screen: NEGATIVE
Unit division: 0

## 2019-06-26 DIAGNOSIS — Z992 Dependence on renal dialysis: Secondary | ICD-10-CM | POA: Diagnosis not present

## 2019-06-26 DIAGNOSIS — N186 End stage renal disease: Secondary | ICD-10-CM | POA: Diagnosis not present

## 2019-06-26 DIAGNOSIS — Z23 Encounter for immunization: Secondary | ICD-10-CM | POA: Diagnosis not present

## 2019-06-26 DIAGNOSIS — D509 Iron deficiency anemia, unspecified: Secondary | ICD-10-CM | POA: Diagnosis not present

## 2019-06-26 DIAGNOSIS — N2581 Secondary hyperparathyroidism of renal origin: Secondary | ICD-10-CM | POA: Diagnosis not present

## 2019-06-28 DIAGNOSIS — Z23 Encounter for immunization: Secondary | ICD-10-CM | POA: Diagnosis not present

## 2019-06-28 DIAGNOSIS — N186 End stage renal disease: Secondary | ICD-10-CM | POA: Diagnosis not present

## 2019-06-28 DIAGNOSIS — D509 Iron deficiency anemia, unspecified: Secondary | ICD-10-CM | POA: Diagnosis not present

## 2019-06-28 DIAGNOSIS — N2581 Secondary hyperparathyroidism of renal origin: Secondary | ICD-10-CM | POA: Diagnosis not present

## 2019-06-28 DIAGNOSIS — Z992 Dependence on renal dialysis: Secondary | ICD-10-CM | POA: Diagnosis not present

## 2019-06-30 ENCOUNTER — Encounter (HOSPITAL_COMMUNITY): Payer: Self-pay | Admitting: Hematology

## 2019-06-30 ENCOUNTER — Other Ambulatory Visit: Payer: Self-pay

## 2019-06-30 ENCOUNTER — Encounter (HOSPITAL_COMMUNITY): Payer: Self-pay | Admitting: *Deleted

## 2019-06-30 ENCOUNTER — Inpatient Hospital Stay (HOSPITAL_BASED_OUTPATIENT_CLINIC_OR_DEPARTMENT_OTHER): Payer: Medicare Other | Admitting: Hematology

## 2019-06-30 ENCOUNTER — Inpatient Hospital Stay (HOSPITAL_COMMUNITY): Payer: Medicare Other

## 2019-06-30 DIAGNOSIS — C179 Malignant neoplasm of small intestine, unspecified: Secondary | ICD-10-CM

## 2019-06-30 DIAGNOSIS — N186 End stage renal disease: Secondary | ICD-10-CM | POA: Diagnosis not present

## 2019-06-30 DIAGNOSIS — C7A00 Malignant carcinoid tumor of unspecified site: Secondary | ICD-10-CM

## 2019-06-30 DIAGNOSIS — C7B02 Secondary carcinoid tumors of liver: Secondary | ICD-10-CM | POA: Diagnosis not present

## 2019-06-30 DIAGNOSIS — D649 Anemia, unspecified: Secondary | ICD-10-CM | POA: Diagnosis not present

## 2019-06-30 DIAGNOSIS — Z992 Dependence on renal dialysis: Secondary | ICD-10-CM | POA: Diagnosis not present

## 2019-06-30 LAB — CBC WITH DIFFERENTIAL/PLATELET
Abs Immature Granulocytes: 0.01 10*3/uL (ref 0.00–0.07)
Basophils Absolute: 0 10*3/uL (ref 0.0–0.1)
Basophils Relative: 1 %
Eosinophils Absolute: 0.2 10*3/uL (ref 0.0–0.5)
Eosinophils Relative: 5 %
HCT: 27.1 % — ABNORMAL LOW (ref 39.0–52.0)
Hemoglobin: 8.1 g/dL — ABNORMAL LOW (ref 13.0–17.0)
Immature Granulocytes: 0 %
Lymphocytes Relative: 9 %
Lymphs Abs: 0.5 10*3/uL — ABNORMAL LOW (ref 0.7–4.0)
MCH: 27.7 pg (ref 26.0–34.0)
MCHC: 29.9 g/dL — ABNORMAL LOW (ref 30.0–36.0)
MCV: 92.8 fL (ref 80.0–100.0)
Monocytes Absolute: 0.5 10*3/uL (ref 0.1–1.0)
Monocytes Relative: 9 %
Neutro Abs: 3.6 10*3/uL (ref 1.7–7.7)
Neutrophils Relative %: 76 %
Platelets: 127 10*3/uL — ABNORMAL LOW (ref 150–400)
RBC: 2.92 MIL/uL — ABNORMAL LOW (ref 4.22–5.81)
RDW: 18.6 % — ABNORMAL HIGH (ref 11.5–15.5)
WBC: 4.8 10*3/uL (ref 4.0–10.5)
nRBC: 0 % (ref 0.0–0.2)

## 2019-06-30 LAB — IRON AND TIBC
Iron: 77 ug/dL (ref 45–182)
Saturation Ratios: 34 % (ref 17.9–39.5)
TIBC: 229 ug/dL — ABNORMAL LOW (ref 250–450)
UIBC: 152 ug/dL

## 2019-06-30 LAB — COMPREHENSIVE METABOLIC PANEL
ALT: 16 U/L (ref 0–44)
AST: 33 U/L (ref 15–41)
Albumin: 3 g/dL — ABNORMAL LOW (ref 3.5–5.0)
Alkaline Phosphatase: 68 U/L (ref 38–126)
Anion gap: 10 (ref 5–15)
BUN: 26 mg/dL — ABNORMAL HIGH (ref 8–23)
CO2: 30 mmol/L (ref 22–32)
Calcium: 8.6 mg/dL — ABNORMAL LOW (ref 8.9–10.3)
Chloride: 100 mmol/L (ref 98–111)
Creatinine, Ser: 3.57 mg/dL — ABNORMAL HIGH (ref 0.61–1.24)
GFR calc Af Amer: 18 mL/min — ABNORMAL LOW (ref >60)
GFR calc non Af Amer: 15 mL/min — ABNORMAL LOW (ref >60)
Glucose, Bld: 111 mg/dL — ABNORMAL HIGH (ref 70–99)
Potassium: 3.9 mmol/L (ref 3.5–5.1)
Sodium: 140 mmol/L (ref 135–145)
Total Bilirubin: 0.8 mg/dL (ref 0.3–1.2)
Total Protein: 6 g/dL — ABNORMAL LOW (ref 6.5–8.1)

## 2019-06-30 LAB — FERRITIN: Ferritin: 466 ng/mL — ABNORMAL HIGH (ref 24–336)

## 2019-06-30 LAB — LACTATE DEHYDROGENASE: LDH: 186 U/L (ref 98–192)

## 2019-06-30 LAB — RETICULOCYTES
Immature Retic Fract: 11.2 % (ref 2.3–15.9)
RBC.: 2.91 MIL/uL — ABNORMAL LOW (ref 4.22–5.81)
Retic Count, Absolute: 83.2 10*3/uL (ref 19.0–186.0)
Retic Ct Pct: 2.9 % (ref 0.4–3.1)

## 2019-06-30 NOTE — Progress Notes (Signed)
I spoke with Allen Davenport with [facility] in [place]. She reports that they are not giving any EPO at this time.  He is scheduled to return tomorrow.  Per Dr. Delton Coombes, orders given for them to begin managing his EPO administrations.  She verbalizes understanding and will notify the nephrologist.

## 2019-06-30 NOTE — Patient Instructions (Addendum)
[facility] at [facility] Discharge Instructions  You were seen today by Dr. Katragadda. He went over your recent lab results. He will see you back in 2 weeks for labs and follow up.   Thank you for choosing [facility] at [facility] to provide your oncology and hematology care.  To afford each patient quality time with our provider, please arrive at least 15 minutes before your scheduled appointment time.   If you have a lab appointment with the Cancer Center please come in thru the  Main Entrance and check in at the main information desk  You need to re-schedule your appointment should you arrive 10 or more minutes late.  We strive to give you quality time with our providers, and arriving late affects you and other patients whose appointments are after yours.  Also, if you no show three or more times for appointments you may be dismissed from the clinic at the providers discretion.     Again, thank you for choosing [facility].  Our hope is that these requests will decrease the amount of time that you wait before being seen by our physicians.       _____________________________________________________________  Should you have questions after your visit to [facility], please contact our office at [phone] between the hours of 8:00 a.m. and 4:30 p.m.  Voicemails left after 4:00 p.m. will not be returned until the following business day.  For prescription refill requests, have your pharmacy contact our office and allow 72 hours.    Cancer Center Support Programs:   > Cancer Support Group  2nd Tuesday of the month 1pm-2pm, Journey Room    

## 2019-06-30 NOTE — Progress Notes (Signed)
[place],  [address]   CLINIC:  Medical Oncology/Hematology  PCP:  [name], MD [place] [phone]   REASON FOR VISIT:  Follow-up for metastatic carcinoid tumor  CURRENT THERAPY: Sandostatin monthly.  Everolimus on hold.     INTERVAL HISTORY:  [name] 78 y.o. male seen for follow-up of metastatic carcinoid to the liver and peritoneum.  Appetite and energy levels are 75%.  Leg swellings have been better with Lasix.  Denies any nausea, vomiting, diarrhea or constipation.  Denies any abdominal pains.  No fevers or night sweats reported.    REVIEW OF SYSTEMS:  Review of Systems  Cardiovascular: Positive for leg swelling.  All other systems reviewed and are negative.    PAST MEDICAL/SURGICAL HISTORY:  Past Medical History:  Diagnosis Date  . Anemia   . Cancer [facility])    right renal . Prostate- Radiation treatment  . CHF (congestive heart failure) ([place])   . Chronic kidney disease    stage 4  . Edema   . Heart murmur    "nothing to worry about"  . Hyperlipidemia   . Hypertension   . Malignant carcinoid tumor ([place]) 01/03/2016   Past Surgical History:  Procedure Laterality Date  . AV FISTULA PLACEMENT Left 10/20/2014   Procedure: Left Arm ARTERIOVENOUS (AV) FISTULA CREATION;  Surgeon: Elam Dutch, MD;  Location: [employer];  Service: Vascular;  Laterality: Left;  . NEPHRECTOMY Right 2010  . PERIPHERAL VASCULAR BALLOON ANGIOPLASTY  06/20/2019   Procedure: PERIPHERAL VASCULAR BALLOON ANGIOPLASTY;  Surgeon: Elam Dutch, MD;  Location: [place] [facility];  Service: Cardiovascular;;     SOCIAL HISTORY:  Social History   Socioeconomic History  . Marital status: Married    Spouse name: Not on file  . Number of children: Not on file  . Years of education: Not on file  . Highest education level: Not on file  Occupational History  . Not on file  Tobacco Use  . Smoking status:  Never Smoker  . Smokeless tobacco: Never Used  Substance and Sexual Activity  . Alcohol use: No    Alcohol/week: 0.0 standard drinks  . Drug use: No  . Sexual activity: Not on file  Other Topics Concern  . Not on file  Social History Narrative  . Not on file   Social Determinants of Health   Financial Resource Strain:   . Difficulty of Paying Living Expenses: Not on file  Food Insecurity:   . Worried About Charity fundraiser in the Last Year: Not on file  . Ran Out of Food in the Last Year: Not on file  Transportation Needs:   . Lack of Transportation (Medical): Not on file  . Lack of Transportation (Non-Medical): Not on file  Physical Activity:   . Days of Exercise per Week: Not on file  . Minutes of Exercise per Session: Not on file  Stress:   . Feeling of Stress : Not on file  Social Connections:   . Frequency of Communication with Friends and Family: Not on file  . Frequency of Social Gatherings with Friends and Family: Not on file  . Attends Religious Services: Not on file  . Active Member of Clubs or Organizations: Not on file  . Attends Archivist Meetings: Not on file  . Marital Status: Not on file  Intimate Partner Violence:   . Fear of Current or Ex-Partner:  Not on file  . Emotionally Abused: Not on file  . Physically Abused: Not on file  . Sexually Abused: Not on file    FAMILY HISTORY:  Family History  Problem Relation Age of Onset  . Cancer Mother   . Hypertension Father   . Cancer Sister     CURRENT MEDICATIONS:  Outpatient Encounter Medications as of 06/30/2019  Medication Sig Note  . alendronate (FOSAMAX) 70 MG tablet TAKE ONE TABLET BY MOUTH ONCE A WEEK IN THE MORNING WITH A FULL GLASS OF WATER ON AN EMPTY STOMACH. DO NOT LAY DOWN FOR 30 MINUTES.   [name] atorvastatin (LIPITOR) 80 MG tablet Take 80 mg by mouth daily at 6 PM.    . calcitRIOL (ROCALTROL) 0.25 MCG capsule Take 0.25 mcg by mouth daily.    . carvedilol (COREG) 3.125 MG tablet  Take 3.125 mg by mouth 2 (two) times daily with a meal.    . Cholecalciferol (VITAMIN D3) 250 MCG (10000 UT) TABS Take by mouth.   . dexamethasone (DECADRON) 0.5 MG/5ML solution SWISH TEN ML BY MOUTH FOUR TIMES DAILY FOR TWO MINUTES THEN SPIT   . epoetin alfa (EPOGEN) 4000 UNIT/ML injection Inject into the skin.   . furosemide (LASIX) 40 MG tablet Take 40 mg by mouth every morning.   . iron polysaccharides (NIFEREX) 150 MG capsule Take by mouth.   . isoniazid (NYDRAZID) 300 MG tablet Take 1 tablet (300 mg total) by mouth daily.   . metolazone (ZAROXOLYN) 5 MG tablet Take 5 mg by mouth every other day.   [name] octreotide (SANDOSTATIN LAR DEPOT) 30 MG injection Inject into the muscle.   [name] octreotide (SANDOSTATIN LAR) 30 MG injection Inject into the muscle.   . Omega-3 Fatty Acids (FISH OIL) 1000 MG CAPS Take 1 capsule by mouth daily.   [name] pyridOXINE (B-6) 50 MG tablet Take 1 tablet (50 mg total) by mouth daily.   . sodium bicarbonate 650 MG tablet Take 650 mg by mouth daily.   . tamsulosin (FLOMAX) 0.4 MG CAPS capsule Take 0.4 mg by mouth daily.  04/26/2016: Received from: External Pharmacy   No facility-administered encounter medications on file as of 06/30/2019.    ALLERGIES:  No Known Allergies   PHYSICAL EXAM:  ECOG Performance status: 1  Vitals:   06/30/19 1331  BP: 115/63  Pulse: 71  Resp: 18  Temp: (!) 97.5 F (36.4 C)  SpO2: 100%   Filed Weights   06/30/19 1331  Weight: 190 lb 1.6 oz (86.2 kg)    Physical Exam Vitals reviewed.  Constitutional:      Appearance: Normal appearance.  Cardiovascular:     Rate and Rhythm: Normal rate and regular rhythm.     Heart sounds: Normal heart sounds.  Pulmonary:     Effort: Pulmonary effort is normal.     Breath sounds: Normal breath sounds.  Abdominal:     General: There is no distension.     Palpations: Abdomen is soft. There is no mass.  Musculoskeletal:        General: No swelling.  Skin:    General: Skin is warm.   Neurological:     General: No focal deficit present.     Mental Status: He is alert and oriented to person, place, and time.  Psychiatric:        Mood and Affect: Mood normal.        Behavior: Behavior normal.      LABORATORY DATA:  I have reviewed the  labs as listed.  CBC    Component Value Date/Time   WBC 4.8 06/30/2019 1254   RBC 2.92 (L) 06/30/2019 1254   RBC 2.91 (L) 06/30/2019 1254   HGB 8.1 (L) 06/30/2019 1254   HCT 27.1 (L) 06/30/2019 1254   PLT 127 (L) 06/30/2019 1254   MCV 92.8 06/30/2019 1254   MCH 27.7 06/30/2019 1254   MCHC 29.9 (L) 06/30/2019 1254   RDW 18.6 (H) 06/30/2019 1254   LYMPHSABS 0.5 (L) 06/30/2019 1254   MONOABS 0.5 06/30/2019 1254   EOSABS 0.2 06/30/2019 1254   BASOSABS 0.0 06/30/2019 1254   CMP Latest Ref Rng & Units 07/02/2019 06/30/2019 06/20/2019  Glucose 65 - 99 mg/dL 145(H) 111(H) 103(H)  BUN 7 - 25 mg/dL 17 26(H) 21  Creatinine 0.70 - 1.18 mg/dL 2.73(H) 3.57(H) 3.60(H)  Sodium 135 - 146 mmol/L 141 140 140  Potassium 3.5 - 5.3 mmol/L 3.9 3.9 2.8(L)  Chloride 98 - 110 mmol/L 102 100 97(L)  CO2 20 - 32 mmol/L 29 30 -  Calcium 8.6 - 10.3 mg/dL 8.3(L) 8.6(L) -  Total Protein 6.1 - 8.1 g/dL 5.2(L) 6.0(L) -  Total Bilirubin 0.2 - 1.2 mg/dL 0.5 0.8 -  Alkaline Phos 38 - 126 U/L - 68 -  AST 10 - 35 U/L 29 33 -  ALT 9 - 46 U/L 22 16 -       DIAGNOSTIC IMAGING:  I have independently reviewed the scans and discussed with the patient.   I have reviewed Venita Lick LPN's note and agree with the documentation.  I personally performed a face-to-face visit, made revisions and my assessment and plan is as follows.    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  Initially managed at [place] center.  Transferred care to [facility] in October 2017. - Gallium-68 scan on 10/23/2017 shows metastatic disease in the liver, central right  mesenteric mass and multiple peritoneal deposits. -Gallium-68 dotatate PET scan on 04/22/2018 showed multiple foci of intense radiotracer activity consistent with metastatic well-differentiated carcinoid tumor.  Metastatic sites include central mesentery, multiple peritoneal implants, liver metastasis.  Several metastatic lesions have measured higher activity.  Additional mild increase in size of central mesenteric mass.  Lesion within the right ventricle along the interventricular septum thought to be metastatic. - 2D echo on 05/03/2018 showed normal cavity size of the right ventricle with normal systolic function.  -Everolimus 5 mg daily started on 05/17/2018 along with dexamethasone mouthwash. - Gallium-68 dotatate scan on 11/27/2018 shows overall stable well-differentiated neuroendocrine tumor metastasis at multiple sites in the liver, peritoneum and potentially heart.  Stable activity of liver lesions.  Decrease in size and activity of the right central mesenteric mass.  Decrease in activity of peritoneal implants. - Restaging gallium-68 dotatate scan on April 08, 2019 revealed no new evidence of disease progression.  Overall his exam was stable compared to prior.  There was a new nodularity in the right lung base that was non-FDG avid. -He was admitted to the hospital on 06/07/2019 with a hemoglobin of 5.7.  He received blood transfusion. -He denied any bleeding per rectum or melena. -Everolimus has been on hold since 06/07/2019.  Last Sandostatin was on 06/09/2019. -We are holding everolimus at this time because of worsening of anemia. -We will reevaluate him in 2 weeks.  If blood counts are staying high from erythropoiesis stimulating  agents, will consider restarting everolimus.   2.  Normocytic anemia: -He was receiving Procrit injections previously.  However he was started on hemodialysis on 05/20/2019. -Last Feraheme infusion was on 06/07/2019. -Last Procrit was on 06/09/2019.  He denies  any bleeding per rectum or melena.  His hemoglobin continues to drop. -We reviewed labs which showed normal K56 and folic acid.  Ferritin was 767 with percent saturation of 9. -We have called and talked to [facility].  He is not receiving Procrit. -Hemoglobin today is 8.1.  We have recommended erythropoiesis stimulating agents with dialysis.  3.  ESRD on HD: -Started HD on 05/20/2019.  Tuesday Thursday and Saturday. -Continue Lasix as needed for leg swelling.     Orders placed this encounter:  No orders of the defined types were placed in this encounter.     Derek Jack, MD [place] [phone]

## 2019-07-01 DIAGNOSIS — Z23 Encounter for immunization: Secondary | ICD-10-CM | POA: Diagnosis not present

## 2019-07-01 DIAGNOSIS — N186 End stage renal disease: Secondary | ICD-10-CM | POA: Diagnosis not present

## 2019-07-01 DIAGNOSIS — N2581 Secondary hyperparathyroidism of renal origin: Secondary | ICD-10-CM | POA: Diagnosis not present

## 2019-07-01 DIAGNOSIS — Z992 Dependence on renal dialysis: Secondary | ICD-10-CM | POA: Diagnosis not present

## 2019-07-01 DIAGNOSIS — D509 Iron deficiency anemia, unspecified: Secondary | ICD-10-CM | POA: Diagnosis not present

## 2019-07-02 ENCOUNTER — Other Ambulatory Visit: Payer: Self-pay

## 2019-07-02 ENCOUNTER — Ambulatory Visit (INDEPENDENT_AMBULATORY_CARE_PROVIDER_SITE_OTHER): Payer: Medicare Other | Admitting: Internal Medicine

## 2019-07-02 ENCOUNTER — Encounter: Payer: Self-pay | Admitting: Internal Medicine

## 2019-07-02 VITALS — BP 127/75 | HR 80 | Temp 98.1°F | Ht 77.0 in | Wt 190.0 lb

## 2019-07-02 DIAGNOSIS — Z5181 Encounter for therapeutic drug level monitoring: Secondary | ICD-10-CM | POA: Insufficient documentation

## 2019-07-02 DIAGNOSIS — Z227 Latent tuberculosis: Secondary | ICD-10-CM | POA: Diagnosis not present

## 2019-07-02 NOTE — Progress Notes (Signed)
   Subjective:    Patient ID: Allen Davenport, male    DOB: 1940/11/15, 78 y.o.   MRN: [id number]  HPI Here for follow up of latent Tb No new issues including no night sweats, no sob, no doe.  No associated rash or diarrhea.  Taking INH and B6 together.  No fever.    Review of Systems  Constitutional: Negative for fatigue, fever and unexpected weight change.  Gastrointestinal: Negative for diarrhea.  Skin: Negative for rash.       Objective:   Physical Exam Constitutional:      Appearance: Normal appearance.  Eyes:     General: No scleral icterus. Cardiovascular:     Rate and Rhythm: Normal rate and regular rhythm.     Heart sounds: No murmur.  Pulmonary:     Effort: Pulmonary effort is normal. No respiratory distress.     Breath sounds: Normal breath sounds.  Skin:    Findings: No rash.  Neurological:     General: No focal deficit present.     Mental Status: He is alert.           Assessment & Plan:

## 2019-07-02 NOTE — Assessment & Plan Note (Signed)
Doing well on INH and no issues.  Will plan for 9 months, now completing his first month.   rtc 6 months.

## 2019-07-02 NOTE — Assessment & Plan Note (Signed)
Will check his lfts on inh.

## 2019-07-03 ENCOUNTER — Encounter (HOSPITAL_COMMUNITY): Payer: Self-pay | Admitting: Hematology

## 2019-07-03 DIAGNOSIS — N2581 Secondary hyperparathyroidism of renal origin: Secondary | ICD-10-CM | POA: Diagnosis not present

## 2019-07-03 DIAGNOSIS — N186 End stage renal disease: Secondary | ICD-10-CM | POA: Diagnosis not present

## 2019-07-03 DIAGNOSIS — D509 Iron deficiency anemia, unspecified: Secondary | ICD-10-CM | POA: Diagnosis not present

## 2019-07-03 DIAGNOSIS — Z992 Dependence on renal dialysis: Secondary | ICD-10-CM | POA: Diagnosis not present

## 2019-07-03 DIAGNOSIS — Z23 Encounter for immunization: Secondary | ICD-10-CM | POA: Diagnosis not present

## 2019-07-03 LAB — COMPREHENSIVE METABOLIC PANEL
AG Ratio: 1.7 (calc) (ref 1.0–2.5)
ALT: 22 U/L (ref 9–46)
AST: 29 U/L (ref 10–35)
Albumin: 3.3 g/dL — ABNORMAL LOW (ref 3.6–5.1)
Alkaline phosphatase (APISO): 62 U/L (ref 35–144)
BUN/Creatinine Ratio: 6 (calc) (ref 6–22)
BUN: 17 mg/dL (ref 7–25)
CO2: 29 mmol/L (ref 20–32)
Calcium: 8.3 mg/dL — ABNORMAL LOW (ref 8.6–10.3)
Chloride: 102 mmol/L (ref 98–110)
Creat: 2.73 mg/dL — ABNORMAL HIGH (ref 0.70–1.18)
Globulin: 1.9 g/dL (calc) (ref 1.9–3.7)
Glucose, Bld: 145 mg/dL — ABNORMAL HIGH (ref 65–99)
Potassium: 3.9 mmol/L (ref 3.5–5.3)
Sodium: 141 mmol/L (ref 135–146)
Total Bilirubin: 0.5 mg/dL (ref 0.2–1.2)
Total Protein: 5.2 g/dL — ABNORMAL LOW (ref 6.1–8.1)

## 2019-07-03 NOTE — Assessment & Plan Note (Addendum)
1.  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  Initially managed at [place] center.  Transferred care to [facility] in October 2017. - Gallium-68 scan on 10/23/2017 shows metastatic disease in the liver, central right mesenteric mass and multiple peritoneal deposits. -Gallium-68 dotatate PET scan on 04/22/2018 showed multiple foci of intense radiotracer activity consistent with metastatic well-differentiated carcinoid tumor.  Metastatic sites include central mesentery, multiple peritoneal implants, liver metastasis.  Several metastatic lesions have measured higher activity.  Additional mild increase in size of central mesenteric mass.  Lesion within the right ventricle along the interventricular septum thought to be metastatic. - 2D echo on 05/03/2018 showed normal cavity size of the right ventricle with normal systolic function.  -Everolimus 5 mg daily started on 05/17/2018 along with dexamethasone mouthwash. - Gallium-68 dotatate scan on 11/27/2018 shows overall stable well-differentiated neuroendocrine tumor metastasis at multiple sites in the liver, peritoneum and potentially heart.  Stable activity of liver lesions.  Decrease in size and activity of the right central mesenteric mass.  Decrease in activity of peritoneal implants. - Restaging gallium-68 dotatate scan on April 08, 2019 revealed no new evidence of disease progression.  Overall his exam was stable compared to prior.  There was a new nodularity in the right lung base that was non-FDG avid. -He was admitted to the hospital on 06/07/2019 with a hemoglobin of 5.7.  He received blood transfusion. -He denied any bleeding per rectum or melena. -Everolimus has been on hold since 06/07/2019.  Last Sandostatin was on 06/09/2019. -We are holding everolimus at this time because of worsening of anemia. -We will reevaluate him in 2 weeks.  If blood counts are staying high from  erythropoiesis stimulating agents, will consider restarting everolimus.   2.  Normocytic anemia: -He was receiving Procrit injections previously.  However he was started on hemodialysis on 05/20/2019. -Last Feraheme infusion was on 06/07/2019. -Last Procrit was on 06/09/2019.  He denies any bleeding per rectum or melena.  His hemoglobin continues to drop. -We reviewed labs which showed normal G26 and folic acid.  Ferritin was 767 with percent saturation of 9. -We have called and talked to [facility].  He is not receiving Procrit. -Hemoglobin today is 8.1.  We have recommended erythropoiesis stimulating agents with dialysis.  3.  ESRD on HD: -Started HD on 05/20/2019.  Tuesday Thursday and Saturday. -Continue Lasix as needed for leg swelling.

## 2019-07-04 LAB — COPPER, SERUM: Copper: 120 ug/dL (ref 72–166)

## 2019-07-04 LAB — METHYLMALONIC ACID, SERUM: Methylmalonic Acid, Quantitative: 489 nmol/L — ABNORMAL HIGH (ref 0–378)

## 2019-07-05 DIAGNOSIS — D509 Iron deficiency anemia, unspecified: Secondary | ICD-10-CM | POA: Diagnosis not present

## 2019-07-05 DIAGNOSIS — N186 End stage renal disease: Secondary | ICD-10-CM | POA: Diagnosis not present

## 2019-07-05 DIAGNOSIS — N2581 Secondary hyperparathyroidism of renal origin: Secondary | ICD-10-CM | POA: Diagnosis not present

## 2019-07-05 DIAGNOSIS — Z992 Dependence on renal dialysis: Secondary | ICD-10-CM | POA: Diagnosis not present

## 2019-07-05 DIAGNOSIS — Z23 Encounter for immunization: Secondary | ICD-10-CM | POA: Diagnosis not present

## 2019-07-08 ENCOUNTER — Other Ambulatory Visit (HOSPITAL_COMMUNITY): Payer: Medicare Other

## 2019-07-08 ENCOUNTER — Ambulatory Visit (HOSPITAL_COMMUNITY): Payer: Medicare Other | Admitting: Hematology

## 2019-07-08 ENCOUNTER — Ambulatory Visit (HOSPITAL_COMMUNITY): Payer: Medicare Other

## 2019-07-08 DIAGNOSIS — Z992 Dependence on renal dialysis: Secondary | ICD-10-CM | POA: Diagnosis not present

## 2019-07-08 DIAGNOSIS — Z23 Encounter for immunization: Secondary | ICD-10-CM | POA: Diagnosis not present

## 2019-07-08 DIAGNOSIS — D509 Iron deficiency anemia, unspecified: Secondary | ICD-10-CM | POA: Diagnosis not present

## 2019-07-08 DIAGNOSIS — N186 End stage renal disease: Secondary | ICD-10-CM | POA: Diagnosis not present

## 2019-07-08 DIAGNOSIS — N2581 Secondary hyperparathyroidism of renal origin: Secondary | ICD-10-CM | POA: Diagnosis not present

## 2019-07-09 ENCOUNTER — Inpatient Hospital Stay (HOSPITAL_COMMUNITY): Payer: Medicare Other

## 2019-07-09 ENCOUNTER — Other Ambulatory Visit: Payer: Self-pay

## 2019-07-09 ENCOUNTER — Inpatient Hospital Stay (HOSPITAL_BASED_OUTPATIENT_CLINIC_OR_DEPARTMENT_OTHER): Payer: Medicare Other | Admitting: Hematology

## 2019-07-09 ENCOUNTER — Encounter (HOSPITAL_COMMUNITY): Payer: Self-pay | Admitting: Hematology

## 2019-07-09 DIAGNOSIS — N186 End stage renal disease: Secondary | ICD-10-CM | POA: Diagnosis not present

## 2019-07-09 DIAGNOSIS — Z992 Dependence on renal dialysis: Secondary | ICD-10-CM | POA: Diagnosis not present

## 2019-07-09 DIAGNOSIS — C179 Malignant neoplasm of small intestine, unspecified: Secondary | ICD-10-CM | POA: Diagnosis not present

## 2019-07-09 DIAGNOSIS — D631 Anemia in chronic kidney disease: Secondary | ICD-10-CM

## 2019-07-09 DIAGNOSIS — C7A00 Malignant carcinoid tumor of unspecified site: Secondary | ICD-10-CM | POA: Diagnosis not present

## 2019-07-09 DIAGNOSIS — D649 Anemia, unspecified: Secondary | ICD-10-CM | POA: Diagnosis not present

## 2019-07-09 DIAGNOSIS — D509 Iron deficiency anemia, unspecified: Secondary | ICD-10-CM

## 2019-07-09 DIAGNOSIS — C7B02 Secondary carcinoid tumors of liver: Secondary | ICD-10-CM | POA: Diagnosis not present

## 2019-07-09 LAB — CBC
HCT: 28.4 % — ABNORMAL LOW (ref 39.0–52.0)
Hemoglobin: 8.8 g/dL — ABNORMAL LOW (ref 13.0–17.0)
MCH: 29.5 pg (ref 26.0–34.0)
MCHC: 31 g/dL (ref 30.0–36.0)
MCV: 95.3 fL (ref 80.0–100.0)
Platelets: 123 10*3/uL — ABNORMAL LOW (ref 150–400)
RBC: 2.98 MIL/uL — ABNORMAL LOW (ref 4.22–5.81)
RDW: 19.6 % — ABNORMAL HIGH (ref 11.5–15.5)
WBC: 4.6 10*3/uL (ref 4.0–10.5)
nRBC: 0 % (ref 0.0–0.2)

## 2019-07-09 MED ORDER — OCTREOTIDE ACETATE 30 MG IM KIT
30.0000 mg | PACK | Freq: Once | INTRAMUSCULAR | Status: AC
  Administered 2019-07-09: 30 mg via INTRAMUSCULAR

## 2019-07-09 MED ORDER — EPOETIN ALFA-EPBX 10000 UNIT/ML IJ SOLN
20000.0000 [IU] | Freq: Once | INTRAMUSCULAR | Status: DC
  Filled 2019-07-09: qty 2

## 2019-07-09 NOTE — Progress Notes (Signed)
07/09/19  Confirmed patient is receiving Retacrit at HD clinic. Discontinue from cancer supportive care.  T.O. Dr Beckey Downing, LPN/Caleigha Zale Ronnald Ramp, PharmD

## 2019-07-09 NOTE — Patient Instructions (Signed)
[place] at [facility]  Discharge Instructions:  Sandostatin injection received today. _______________________________________________________________  Thank you for choosing [place] at [facility] to provide your oncology and hematology care.  To afford each patient quality time with our providers, please arrive at least 15 minutes before your scheduled appointment.  You need to re-schedule your appointment if you arrive 10 or more minutes late.  We strive to give you quality time with our providers, and arriving late affects you and other patients whose appointments are after yours.  Also, if you no show three or more times for appointments you may be dismissed from the clinic.  Again, thank you for choosing [place] at [place] hope is that these requests will allow you access to exceptional care and in a timely manner. _______________________________________________________________  If you have questions after your visit, please contact our office at [phone] between the hours of 8:30 a.m. and 5:00 p.m. Voicemails left after 4:30 p.m. will not be returned until the following business day. _______________________________________________________________  For prescription refill requests, have your pharmacy contact our office. _______________________________________________________________  Recommendations made by the consultant and any test results will be sent to your referring physician. _______________________________________________________________

## 2019-07-09 NOTE — Progress Notes (Signed)
Allen Davenport presents today for Sandostatin injection. VSS. Patient tolerated injection into left buttock without incident or complaint. See MAR for details. Site clean and dry, bandaid applied. Pt discharged in satisfactory condition with follow up instructions.

## 2019-07-09 NOTE — Patient Instructions (Addendum)
[place] at [facility] Discharge Instructions  You were seen today by Dr. Delton Coombes. He went over your recent lab results. Continue your sandostatin injections. CONTINUE TO HOLD YOUR CHEMO PILL FOR ANOTHER MONTH. He will see you back in 4 weeks for labs and follow up.   Thank you for choosing [place] at [facility] to provide your oncology and hematology care.  To afford each patient quality time with our provider, please arrive at least 15 minutes before your scheduled appointment time.   If you have a lab appointment with the [facility] please come in thru the  Main Entrance and check in at the main information desk  You need to re-schedule your appointment should you arrive 10 or more minutes late.  We strive to give you quality time with our providers, and arriving late affects you and other patients whose appointments are after yours.  Also, if you no show three or more times for appointments you may be dismissed from the clinic at the providers discretion.     Again, thank you for choosing [facility].  Our hope is that these requests will decrease the amount of time that you wait before being seen by our physicians.       _____________________________________________________________  Should you have questions after your visit to [facility], please contact our office at [phone] between the hours of 8:00 a.m. and 4:30 p.m.  Voicemails left after 4:00 p.m. will not be returned until the following business day.  For prescription refill requests, have your pharmacy contact our office and allow 72 hours.    Cancer Center Support Programs:   > Cancer Support Group  2nd Tuesday of the month 1pm-2pm, Journey Room

## 2019-07-09 NOTE — Progress Notes (Signed)
[place], [place] [address]   CLINIC:  Medical Oncology/Hematology  PCP:  [name], MD [place] [phone]   REASON FOR VISIT:  Follow-up for metastatic carcinoid tumor  CURRENT THERAPY: Sandostatin monthly.  Everolimus on hold.     INTERVAL HISTORY:  Allen Davenport 78 y.o. male seen for follow-up of metastatic carcinoid to the liver and peritoneum.  Appetite and energy levels are 75%.  Denies any new onset pains.  Feet swelling has improved slightly.  Denies any flushing, wheezing or diarrhea.  No abdominal pains.  No new onset cough or fevers noted.  Denies any bleeding per rectum or melena.    REVIEW OF SYSTEMS:  Review of Systems  Cardiovascular: Positive for leg swelling.  All other systems reviewed and are negative.    PAST MEDICAL/SURGICAL HISTORY:  Past Medical History:  Diagnosis Date  . Anemia   . Cancer [facility])    right renal . Prostate- Radiation treatment  . CHF (congestive heart failure) ([place])   . Chronic kidney disease    stage 4  . Edema   . Heart murmur    "nothing to worry about"  . Hyperlipidemia   . Hypertension   . Malignant carcinoid tumor ([place]) 01/03/2016   Past Surgical History:  Procedure Laterality Date  . AV FISTULA PLACEMENT Left 10/20/2014   Procedure: Left Arm ARTERIOVENOUS (AV) FISTULA CREATION;  Surgeon: Elam Dutch, MD;  Location: [place];  Service: Vascular;  Laterality: Left;  . NEPHRECTOMY Right 2010  . PERIPHERAL VASCULAR BALLOON ANGIOPLASTY  06/20/2019   Procedure: PERIPHERAL VASCULAR BALLOON ANGIOPLASTY;  Surgeon: Elam Dutch, MD;  Location: [place] [facility];  Service: Cardiovascular;;     SOCIAL HISTORY:  Social History   Socioeconomic History  . Marital status: Married    Spouse name: Not on file  . Number of children: Not on file  . Years of education: Not on file  . Highest education level: Not on file  Occupational History  .  Not on file  Tobacco Use  . Smoking status: Never Smoker  . Smokeless tobacco: Never Used  Substance and Sexual Activity  . Alcohol use: No    Alcohol/week: 0.0 standard drinks  . Drug use: No  . Sexual activity: Not on file  Other Topics Concern  . Not on file  Social History Narrative  . Not on file   Social Determinants of Health   Financial Resource Strain:   . Difficulty of Paying Living Expenses: Not on file  Food Insecurity:   . Worried About Charity fundraiser in the Last Year: Not on file  . Ran Out of Food in the Last Year: Not on file  Transportation Needs:   . Lack of Transportation (Medical): Not on file  . Lack of Transportation (Non-Medical): Not on file  Physical Activity:   . Days of Exercise per Week: Not on file  . Minutes of Exercise per Session: Not on file  Stress:   . Feeling of Stress : Not on file  Social Connections:   . Frequency of Communication with Friends and Family: Not on file  . Frequency of Social Gatherings with Friends and Family: Not on file  . Attends Religious Services: Not on file  . Active Member of Clubs or Organizations: Not on file  . Attends Archivist Meetings: Not on file  . Marital Status: Not on file  Intimate Partner Violence:   . Fear of Current or Ex-Partner: Not on file  . Emotionally Abused: Not on file  . Physically Abused: Not on file  . Sexually Abused: Not on file    FAMILY HISTORY:  Family History  Problem Relation Age of Onset  . Cancer Mother   . Hypertension Father   . Cancer Sister     CURRENT MEDICATIONS:  Outpatient Encounter Medications as of 07/09/2019  Medication Sig Note  . alendronate (FOSAMAX) 70 MG tablet TAKE ONE TABLET BY MOUTH ONCE A WEEK IN THE MORNING WITH A FULL GLASS OF WATER ON AN EMPTY STOMACH. DO NOT LAY DOWN FOR 30 MINUTES.   [name] atorvastatin (LIPITOR) 80 MG tablet Take 80 mg by mouth daily at 6 PM.    . calcitRIOL (ROCALTROL) 0.25 MCG capsule Take 0.25 mcg by mouth  daily.    . carvedilol (COREG) 3.125 MG tablet Take 3.125 mg by mouth 2 (two) times daily with a meal.    . Cholecalciferol (VITAMIN D3) 250 MCG (10000 UT) TABS Take by mouth.   . dexamethasone (DECADRON) 0.5 MG/5ML solution SWISH TEN ML BY MOUTH FOUR TIMES DAILY FOR TWO MINUTES THEN SPIT   . epoetin alfa (EPOGEN) 4000 UNIT/ML injection Inject into the skin.   . furosemide (LASIX) 40 MG tablet Take 40 mg by mouth every morning.   . iron polysaccharides (NIFEREX) 150 MG capsule Take by mouth.   . isoniazid (NYDRAZID) 300 MG tablet Take 1 tablet (300 mg total) by mouth daily.   . metolazone (ZAROXOLYN) 5 MG tablet Take 5 mg by mouth every other day.   [name] octreotide (SANDOSTATIN LAR DEPOT) 30 MG injection Inject into the muscle.   [name] octreotide (SANDOSTATIN LAR) 30 MG injection Inject into the muscle.   . Omega-3 Fatty Acids (FISH OIL) 1000 MG CAPS Take 1 capsule by mouth daily.   [name] pyridOXINE (B-6) 50 MG tablet Take 1 tablet (50 mg total) by mouth daily.   . sodium bicarbonate 650 MG tablet Take 650 mg by mouth daily.   . tamsulosin (FLOMAX) 0.4 MG CAPS capsule Take 0.4 mg by mouth daily.  04/26/2016: Received from: External Pharmacy   No facility-administered encounter medications on file as of 07/09/2019.    ALLERGIES:  No Known Allergies   PHYSICAL EXAM:  ECOG Performance status: 1  Vitals:   07/09/19 0911  BP: 111/60  Pulse: 69  Resp: 16  Temp: (!) 97.5 F (36.4 C)  SpO2: 96%   Filed Weights   07/09/19 0911  Weight: 185 lb 6.4 oz (84.1 kg)    Physical Exam Vitals reviewed.  Constitutional:      Appearance: Normal appearance.  Cardiovascular:     Rate and Rhythm: Normal rate and regular rhythm.     Heart sounds: Normal heart sounds.  Pulmonary:     Effort: Pulmonary effort is normal.     Breath sounds: Normal breath sounds.  Abdominal:     General: There is no distension.     Palpations: Abdomen is soft. There is no mass.  Musculoskeletal:        General: No  swelling.  Skin:    General: Skin is warm.  Neurological:     General: No focal deficit present.     Mental Status: He is alert and oriented to person, place, and time.  Psychiatric:        Mood and Affect: Mood normal.        Behavior: Behavior normal.  LABORATORY DATA:  I have reviewed the labs as listed.  CBC    Component Value Date/Time   WBC 4.6 07/09/2019 0838   RBC 2.98 (L) 07/09/2019 0838   HGB 8.8 (L) 07/09/2019 0838   HCT 28.4 (L) 07/09/2019 0838   PLT 123 (L) 07/09/2019 0838   MCV 95.3 07/09/2019 0838   MCH 29.5 07/09/2019 0838   MCHC 31.0 07/09/2019 0838   RDW 19.6 (H) 07/09/2019 0838   LYMPHSABS 0.5 (L) 06/30/2019 1254   MONOABS 0.5 06/30/2019 1254   EOSABS 0.2 06/30/2019 1254   BASOSABS 0.0 06/30/2019 1254   CMP Latest Ref Rng & Units 07/02/2019 06/30/2019 06/20/2019  Glucose 65 - 99 mg/dL 145(H) 111(H) 103(H)  BUN 7 - 25 mg/dL 17 26(H) 21  Creatinine 0.70 - 1.18 mg/dL 2.73(H) 3.57(H) 3.60(H)  Sodium 135 - 146 mmol/L 141 140 140  Potassium 3.5 - 5.3 mmol/L 3.9 3.9 2.8(L)  Chloride 98 - 110 mmol/L 102 100 97(L)  CO2 20 - 32 mmol/L 29 30 -  Calcium 8.6 - 10.3 mg/dL 8.3(L) 8.6(L) -  Total Protein 6.1 - 8.1 g/dL 5.2(L) 6.0(L) -  Total Bilirubin 0.2 - 1.2 mg/dL 0.5 0.8 -  Alkaline Phos 38 - 126 U/L - 68 -  AST 10 - 35 U/L 29 33 -  ALT 9 - 46 U/L 22 16 -       DIAGNOSTIC IMAGING:  I have independently reviewed the scans and discussed with the patient.   I have reviewed Venita Lick LPN's note and agree with the documentation.  I personally performed a face-to-face visit, made revisions and my assessment and plan is as follows.    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  Initially managed at [facility].  Transferred care to [facility] in October 2017. - Gallium-68 scan on 10/23/2017 shows metastatic disease in the liver,  central right mesenteric mass and multiple peritoneal deposits. -Gallium-68 dotatate PET scan on 04/22/2018 showed multiple foci of intense radiotracer activity consistent with metastatic well-differentiated carcinoid tumor.  Metastatic sites include central mesentery, multiple peritoneal implants, liver metastasis.  Several metastatic lesions have measured higher activity.  Additional mild increase in size of central mesenteric mass.  Lesion within the right ventricle along the interventricular septum thought to be metastatic. - 2D echo on 05/03/2018 showed normal cavity size of the right ventricle with normal systolic function.  -Everolimus 5 mg daily started on 05/17/2018 along with dexamethasone mouthwash. -Gallium-68 dotatate scan on 04/08/2019 revealed no evidence of disease progression.  Overall the exam is stable compared to prior scan in May 2020.  There was a new nodularity in the right lung base that was non-FDG avid. -He was admitted to the hospital on 06/07/2019 with a hemoglobin of 5.7.  He received blood transfusion. -He denied any bleeding per rectum or melena. -Everolimus has been on hold since 06/07/2019.  Last Sandostatin was on 06/09/2019. -We reached out to his dialysis clinic who are giving erythropoiesis stimulating agents. -We reviewed his labs today.  Hemoglobin is 8.8. -I will check back with him in 4 weeks with repeat labs.  If the hemoglobin is continuing to improve, we will will consider restarting everolimus.   2.  Normocytic anemia: -He was receiving Procrit injections previously at our clinic.  However he was started on dialysis on 05/20/2019. -We have reached out to his dialysis  clinic to start him on erythropoiesis stimulating agents.  Today hemoglobin improved to 8.8.  3.  ESRD on HD: -Started HD on 05/20/2019.  Tuesday Thursday and Saturday. -Continue Lasix as needed for leg swelling.     Orders placed this encounter:  No orders of the defined types were placed  in this encounter.     Derek Jack, MD [place] [phone]

## 2019-07-09 NOTE — Assessment & Plan Note (Signed)
1.  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  Initially managed at [facility].  Transferred care to [facility] in October 2017. - Gallium-68 scan on 10/23/2017 shows metastatic disease in the liver, central right mesenteric mass and multiple peritoneal deposits. -Gallium-68 dotatate PET scan on 04/22/2018 showed multiple foci of intense radiotracer activity consistent with metastatic well-differentiated carcinoid tumor.  Metastatic sites include central mesentery, multiple peritoneal implants, liver metastasis.  Several metastatic lesions have measured higher activity.  Additional mild increase in size of central mesenteric mass.  Lesion within the right ventricle along the interventricular septum thought to be metastatic. - 2D echo on 05/03/2018 showed normal cavity size of the right ventricle with normal systolic function.  -Everolimus 5 mg daily started on 05/17/2018 along with dexamethasone mouthwash. -Gallium-68 dotatate scan on 04/08/2019 revealed no evidence of disease progression.  Overall the exam is stable compared to prior scan in May 2020.  There was a new nodularity in the right lung base that was non-FDG avid. -He was admitted to the hospital on 06/07/2019 with a hemoglobin of 5.7.  He received blood transfusion. -He denied any bleeding per rectum or melena. -Everolimus has been on hold since 06/07/2019.  Last Sandostatin was on 06/09/2019. -We reached out to his dialysis clinic who are giving erythropoiesis stimulating agents. -We reviewed his labs today.  Hemoglobin is 8.8. -I will check back with him in 4 weeks with repeat labs.  If the hemoglobin is continuing to improve, we will will consider restarting everolimus.   2.  Normocytic anemia: -He was receiving Procrit injections previously at our clinic.  However he was started on dialysis on 05/20/2019. -We have reached out to his dialysis clinic to start him on  erythropoiesis stimulating agents.  Today hemoglobin improved to 8.8.  3.  ESRD on HD: -Started HD on 05/20/2019.  Tuesday Thursday and Saturday. -Continue Lasix as needed for leg swelling.

## 2019-07-10 DIAGNOSIS — N186 End stage renal disease: Secondary | ICD-10-CM | POA: Diagnosis not present

## 2019-07-10 DIAGNOSIS — Z23 Encounter for immunization: Secondary | ICD-10-CM | POA: Diagnosis not present

## 2019-07-10 DIAGNOSIS — N2581 Secondary hyperparathyroidism of renal origin: Secondary | ICD-10-CM | POA: Diagnosis not present

## 2019-07-10 DIAGNOSIS — Z992 Dependence on renal dialysis: Secondary | ICD-10-CM | POA: Diagnosis not present

## 2019-07-10 DIAGNOSIS — D509 Iron deficiency anemia, unspecified: Secondary | ICD-10-CM | POA: Diagnosis not present

## 2019-07-13 DIAGNOSIS — N186 End stage renal disease: Secondary | ICD-10-CM | POA: Diagnosis not present

## 2019-07-13 DIAGNOSIS — N2581 Secondary hyperparathyroidism of renal origin: Secondary | ICD-10-CM | POA: Diagnosis not present

## 2019-07-13 DIAGNOSIS — Z992 Dependence on renal dialysis: Secondary | ICD-10-CM | POA: Diagnosis not present

## 2019-07-13 DIAGNOSIS — D509 Iron deficiency anemia, unspecified: Secondary | ICD-10-CM | POA: Diagnosis not present

## 2019-07-13 DIAGNOSIS — Z23 Encounter for immunization: Secondary | ICD-10-CM | POA: Diagnosis not present

## 2019-07-15 ENCOUNTER — Other Ambulatory Visit: Payer: Self-pay | Admitting: *Deleted

## 2019-07-15 DIAGNOSIS — Z992 Dependence on renal dialysis: Secondary | ICD-10-CM | POA: Diagnosis not present

## 2019-07-15 DIAGNOSIS — N186 End stage renal disease: Secondary | ICD-10-CM | POA: Diagnosis not present

## 2019-07-15 DIAGNOSIS — N2581 Secondary hyperparathyroidism of renal origin: Secondary | ICD-10-CM | POA: Diagnosis not present

## 2019-07-15 DIAGNOSIS — D509 Iron deficiency anemia, unspecified: Secondary | ICD-10-CM | POA: Diagnosis not present

## 2019-07-15 DIAGNOSIS — Z23 Encounter for immunization: Secondary | ICD-10-CM | POA: Diagnosis not present

## 2019-07-15 DIAGNOSIS — D631 Anemia in chronic kidney disease: Secondary | ICD-10-CM

## 2019-07-16 ENCOUNTER — Ambulatory Visit (INDEPENDENT_AMBULATORY_CARE_PROVIDER_SITE_OTHER): Payer: Medicare Other | Admitting: Vascular Surgery

## 2019-07-16 ENCOUNTER — Other Ambulatory Visit: Payer: Self-pay

## 2019-07-16 ENCOUNTER — Ambulatory Visit (HOSPITAL_COMMUNITY)
Admission: RE | Admit: 2019-07-16 | Discharge: 2019-07-16 | Disposition: A | Discharge status: Home / Self Care | Payer: Medicare Other | Source: Ambulatory Visit | Attending: Family | Admitting: Family

## 2019-07-16 ENCOUNTER — Encounter: Payer: Self-pay | Admitting: Vascular Surgery

## 2019-07-16 VITALS — BP 110/62 | HR 60 | Temp 98.0°F | Resp 18 | Ht 77.0 in | Wt 168.0 lb

## 2019-07-16 DIAGNOSIS — Z992 Dependence on renal dialysis: Secondary | ICD-10-CM

## 2019-07-16 DIAGNOSIS — N186 End stage renal disease: Secondary | ICD-10-CM

## 2019-07-16 DIAGNOSIS — D631 Anemia in chronic kidney disease: Secondary | ICD-10-CM | POA: Diagnosis present

## 2019-07-16 DIAGNOSIS — N184 Chronic kidney disease, stage 4 (severe): Secondary | ICD-10-CM | POA: Diagnosis present

## 2019-07-16 NOTE — H&P (View-Only) (Signed)
Patient is a 77 year old male who returns for follow-up today.  He underwent angioplasty of 2 segments of his left arm AV fistula June 20, 2019.  They are apparently still having problems with flow and occasionally pulling clots through his lines on dialysis.  His current dialysis schedule is Tuesday Thursday Saturday.  Past Medical History:  Diagnosis Date  . Anemia   . Cancer [facility])    right renal . Prostate- Radiation treatment  . CHF (congestive heart failure) ([place])   . Chronic kidney disease    stage 4  . Edema   . Heart murmur    "nothing to worry about"  . Hyperlipidemia   . Hypertension   . Malignant carcinoid tumor ([place]) 01/03/2016    Past Surgical History:  Procedure Laterality Date  . AV FISTULA PLACEMENT Left 10/20/2014   Procedure: Left Arm ARTERIOVENOUS (AV) FISTULA CREATION;  Surgeon: Elam Dutch, MD;  Location: [place];  Service: Vascular;  Laterality: Left;  . NEPHRECTOMY Right 2010  . PERIPHERAL VASCULAR BALLOON ANGIOPLASTY  06/20/2019   Procedure: PERIPHERAL VASCULAR BALLOON ANGIOPLASTY;  Surgeon: Elam Dutch, MD;  Location: [place] [facility];  Service: Cardiovascular;;    Current Outpatient Medications on File Prior to Visit  Medication Sig Dispense Refill  . alendronate (FOSAMAX) 70 MG tablet TAKE ONE TABLET BY MOUTH ONCE A WEEK IN THE MORNING WITH A FULL GLASS OF WATER ON AN EMPTY STOMACH. DO NOT LAY DOWN FOR 30 MINUTES.    [name] atorvastatin (LIPITOR) 80 MG tablet Take 80 mg by mouth daily at 6 PM.     . calcitRIOL (ROCALTROL) 0.25 MCG capsule Take 0.25 mcg by mouth daily.     . carvedilol (COREG) 3.125 MG tablet Take 3.125 mg by mouth 2 (two) times daily with a meal.     . Cholecalciferol (VITAMIN D3) 250 MCG (10000 UT) TABS Take by mouth.    . dexamethasone (DECADRON) 0.5 MG/5ML solution SWISH TEN ML BY MOUTH FOUR TIMES DAILY FOR TWO MINUTES THEN SPIT 500 mL 4  . epoetin alfa (EPOGEN) 4000 UNIT/ML injection Inject into the skin.    . furosemide (LASIX)  40 MG tablet Take 40 mg by mouth every morning.    . iron polysaccharides (NIFEREX) 150 MG capsule Take by mouth.    . isoniazid (NYDRAZID) 300 MG tablet Take 1 tablet (300 mg total) by mouth daily. 30 tablet 8  . metolazone (ZAROXOLYN) 5 MG tablet Take 5 mg by mouth every other day.    [name] octreotide (SANDOSTATIN LAR DEPOT) 30 MG injection Inject into the muscle.    [name] octreotide (SANDOSTATIN LAR) 30 MG injection Inject into the muscle.    . Omega-3 Fatty Acids (FISH OIL) 1000 MG CAPS Take 1 capsule by mouth daily.    [name] pyridOXINE (B-6) 50 MG tablet Take 1 tablet (50 mg total) by mouth daily. 30 tablet 8  . sodium bicarbonate 650 MG tablet Take 650 mg by mouth daily.    . tamsulosin (FLOMAX) 0.4 MG CAPS capsule Take 0.4 mg by mouth daily.      No current facility-administered medications on file prior to visit.    No Known Allergies  Review of systems: He has no shortness of breath.  He has no chest pain.  Physical exam:  Vitals:   07/16/19 1042  BP: 110/62  Pulse: 60  Resp: 18  Temp: 98 F (36.7 C)  TempSrc: Temporal  SpO2: 100%  Weight: 168 lb (76.2 kg)  Height: 6'  5" (1.956 m)   Left upper extremity: Palpable thrill in fistula some ecchymosis and bruising in the distal one third of the fistula  Data: I reviewed the patient's recent fistulogram.  The proximal aspect of the fistula had some degeneration but was patent.  The 2 segments that were angioplastied look like most likely retained valves.  There were also several side branches which could potentially be ligated.  Midportion of the fistula was 13 mm diameter.  However the areas of thickened valves may be of flow limitation.  Assessment: Poorly functioning left arm AV fistula despite recent angioplasty less than 1 month ago.  Plan: Revision left arm AV fistula possibly patch to areas with multiple sidebranch ligation.  We will examine under ultrasound in the operating room to figure out exactly which areas of the fistula  would be revision.  I discussed with the patient today that he potentially would also need a tunneled dialysis catheter if we have to revise a large segment of the fistula.  He understands and wishes to proceed.  Risk benefits possible complications of procedure details include but not limited to bleeding infection fistula thrombosis were discussed with the patient today.  He understands agrees to proceed.  Ruta Hinds, MD [facility] [place] Office: [phone]

## 2019-07-16 NOTE — Progress Notes (Signed)
Patient is a 78 year old male who returns for follow-up today.  He underwent angioplasty of 2 segments of his left arm AV fistula June 20, 2019.  They are apparently still having problems with flow and occasionally pulling clots through his lines on dialysis.  His current dialysis schedule is Tuesday Thursday Saturday.  Past Medical History:  Diagnosis Date  . Anemia   . Cancer [facility])    right renal . Prostate- Radiation treatment  . CHF (congestive heart failure) ([place])   . Chronic kidney disease    stage 4  . Edema   . Heart murmur    "nothing to worry about"  . Hyperlipidemia   . Hypertension   . Malignant carcinoid tumor ([place]) 01/03/2016    Past Surgical History:  Procedure Laterality Date  . AV FISTULA PLACEMENT Left 10/20/2014   Procedure: Left Arm ARTERIOVENOUS (AV) FISTULA CREATION;  Surgeon: Elam Dutch, MD;  Location: [place];  Service: Vascular;  Laterality: Left;  . NEPHRECTOMY Right 2010  . PERIPHERAL VASCULAR BALLOON ANGIOPLASTY  06/20/2019   Procedure: PERIPHERAL VASCULAR BALLOON ANGIOPLASTY;  Surgeon: Elam Dutch, MD;  Location: [place] [facility];  Service: Cardiovascular;;    Current Outpatient Medications on File Prior to Visit  Medication Sig Dispense Refill  . alendronate (FOSAMAX) 70 MG tablet TAKE ONE TABLET BY MOUTH ONCE A WEEK IN THE MORNING WITH A FULL GLASS OF WATER ON AN EMPTY STOMACH. DO NOT LAY DOWN FOR 30 MINUTES.    [name] atorvastatin (LIPITOR) 80 MG tablet Take 80 mg by mouth daily at 6 PM.     . calcitRIOL (ROCALTROL) 0.25 MCG capsule Take 0.25 mcg by mouth daily.     . carvedilol (COREG) 3.125 MG tablet Take 3.125 mg by mouth 2 (two) times daily with a meal.     . Cholecalciferol (VITAMIN D3) 250 MCG (10000 UT) TABS Take by mouth.    . dexamethasone (DECADRON) 0.5 MG/5ML solution SWISH TEN ML BY MOUTH FOUR TIMES DAILY FOR TWO MINUTES THEN SPIT 500 mL 4  . epoetin alfa (EPOGEN) 4000 UNIT/ML injection Inject into the skin.    . furosemide (LASIX)  40 MG tablet Take 40 mg by mouth every morning.    . iron polysaccharides (NIFEREX) 150 MG capsule Take by mouth.    . isoniazid (NYDRAZID) 300 MG tablet Take 1 tablet (300 mg total) by mouth daily. 30 tablet 8  . metolazone (ZAROXOLYN) 5 MG tablet Take 5 mg by mouth every other day.    [name] octreotide (SANDOSTATIN LAR DEPOT) 30 MG injection Inject into the muscle.    [name] octreotide (SANDOSTATIN LAR) 30 MG injection Inject into the muscle.    . Omega-3 Fatty Acids (FISH OIL) 1000 MG CAPS Take 1 capsule by mouth daily.    [name] pyridOXINE (B-6) 50 MG tablet Take 1 tablet (50 mg total) by mouth daily. 30 tablet 8  . sodium bicarbonate 650 MG tablet Take 650 mg by mouth daily.    . tamsulosin (FLOMAX) 0.4 MG CAPS capsule Take 0.4 mg by mouth daily.      No current facility-administered medications on file prior to visit.    No Known Allergies  Review of systems: He has no shortness of breath.  He has no chest pain.  Physical exam:  Vitals:   07/16/19 1042  BP: 110/62  Pulse: 60  Resp: 18  Temp: 98 F (36.7 C)  TempSrc: Temporal  SpO2: 100%  Weight: 168 lb (76.2 kg)  Height: 6'  5" (1.956 m)   Left upper extremity: Palpable thrill in fistula some ecchymosis and bruising in the distal one third of the fistula  Data: I reviewed the patient's recent fistulogram.  The proximal aspect of the fistula had some degeneration but was patent.  The 2 segments that were angioplastied look like most likely retained valves.  There were also several side branches which could potentially be ligated.  Midportion of the fistula was 13 mm diameter.  However the areas of thickened valves may be of flow limitation.  Assessment: Poorly functioning left arm AV fistula despite recent angioplasty less than 1 month ago.  Plan: Revision left arm AV fistula possibly patch to areas with multiple sidebranch ligation.  We will examine under ultrasound in the operating room to figure out exactly which areas of the fistula  would be revision.  I discussed with the patient today that he potentially would also need a tunneled dialysis catheter if we have to revise a large segment of the fistula.  He understands and wishes to proceed.  Risk benefits possible complications of procedure details include but not limited to bleeding infection fistula thrombosis were discussed with the patient today.  He understands agrees to proceed.  Ruta Hinds, MD [facility] [place] Office: [phone]

## 2019-07-17 DIAGNOSIS — Z23 Encounter for immunization: Secondary | ICD-10-CM | POA: Diagnosis not present

## 2019-07-17 DIAGNOSIS — N2581 Secondary hyperparathyroidism of renal origin: Secondary | ICD-10-CM | POA: Diagnosis not present

## 2019-07-17 DIAGNOSIS — Z992 Dependence on renal dialysis: Secondary | ICD-10-CM | POA: Diagnosis not present

## 2019-07-17 DIAGNOSIS — N186 End stage renal disease: Secondary | ICD-10-CM | POA: Diagnosis not present

## 2019-07-17 DIAGNOSIS — D509 Iron deficiency anemia, unspecified: Secondary | ICD-10-CM | POA: Diagnosis not present

## 2019-07-19 DIAGNOSIS — N186 End stage renal disease: Secondary | ICD-10-CM | POA: Diagnosis not present

## 2019-07-19 DIAGNOSIS — Z992 Dependence on renal dialysis: Secondary | ICD-10-CM | POA: Diagnosis not present

## 2019-07-19 DIAGNOSIS — D509 Iron deficiency anemia, unspecified: Secondary | ICD-10-CM | POA: Diagnosis not present

## 2019-07-19 DIAGNOSIS — T82898A Other specified complication of vascular prosthetic devices, implants and grafts, initial encounter: Secondary | ICD-10-CM | POA: Diagnosis not present

## 2019-07-19 DIAGNOSIS — Z23 Encounter for immunization: Secondary | ICD-10-CM | POA: Diagnosis not present

## 2019-07-19 DIAGNOSIS — D631 Anemia in chronic kidney disease: Secondary | ICD-10-CM | POA: Diagnosis not present

## 2019-07-22 DIAGNOSIS — Z992 Dependence on renal dialysis: Secondary | ICD-10-CM | POA: Diagnosis not present

## 2019-07-22 DIAGNOSIS — N186 End stage renal disease: Secondary | ICD-10-CM | POA: Diagnosis not present

## 2019-07-22 DIAGNOSIS — Z23 Encounter for immunization: Secondary | ICD-10-CM | POA: Diagnosis not present

## 2019-07-22 DIAGNOSIS — D631 Anemia in chronic kidney disease: Secondary | ICD-10-CM | POA: Diagnosis not present

## 2019-07-22 DIAGNOSIS — T82898A Other specified complication of vascular prosthetic devices, implants and grafts, initial encounter: Secondary | ICD-10-CM | POA: Diagnosis not present

## 2019-07-22 DIAGNOSIS — D509 Iron deficiency anemia, unspecified: Secondary | ICD-10-CM | POA: Diagnosis not present

## 2019-07-23 ENCOUNTER — Inpatient Hospital Stay (HOSPITAL_COMMUNITY): Payer: Medicare Other

## 2019-07-23 ENCOUNTER — Ambulatory Visit (HOSPITAL_COMMUNITY): Payer: Medicare Other

## 2019-07-23 ENCOUNTER — Inpatient Hospital Stay (HOSPITAL_COMMUNITY): Payer: Medicare Other | Attending: Hematology

## 2019-07-23 ENCOUNTER — Other Ambulatory Visit: Payer: Self-pay

## 2019-07-23 DIAGNOSIS — D509 Iron deficiency anemia, unspecified: Secondary | ICD-10-CM

## 2019-07-23 DIAGNOSIS — C7B02 Secondary carcinoid tumors of liver: Secondary | ICD-10-CM | POA: Diagnosis not present

## 2019-07-23 DIAGNOSIS — C7A00 Malignant carcinoid tumor of unspecified site: Secondary | ICD-10-CM | POA: Diagnosis not present

## 2019-07-23 DIAGNOSIS — C179 Malignant neoplasm of small intestine, unspecified: Secondary | ICD-10-CM

## 2019-07-23 DIAGNOSIS — D631 Anemia in chronic kidney disease: Secondary | ICD-10-CM

## 2019-07-23 LAB — CBC
HCT: 29.1 % — ABNORMAL LOW (ref 39.0–52.0)
Hemoglobin: 9 g/dL — ABNORMAL LOW (ref 13.0–17.0)
MCH: 30.3 pg (ref 26.0–34.0)
MCHC: 30.9 g/dL (ref 30.0–36.0)
MCV: 98 fL (ref 80.0–100.0)
Platelets: 128 10*3/uL — ABNORMAL LOW (ref 150–400)
RBC: 2.97 MIL/uL — ABNORMAL LOW (ref 4.22–5.81)
RDW: 18.6 % — ABNORMAL HIGH (ref 11.5–15.5)
WBC: 4.6 10*3/uL (ref 4.0–10.5)
nRBC: 0 % (ref 0.0–0.2)

## 2019-07-23 LAB — SAMPLE TO BLOOD BANK

## 2019-07-23 NOTE — Progress Notes (Signed)
Patient did not receive Retacrit today. Patient is supposed to be receiving retracrit from [facility] in [place]. Orders were sent over in the middle of December but the orders were not acted upon @ [facility]. Bobbye Charleston said that they do not handle blood work in house anymore and that someone manages remotely and that has caused some issues. She will call me back today or tomorrow to give me an update. I asked her to please start giving patient his Epogen there as ordered. Patient made aware of all of this and verbalized understanding. Patient to just receive Sandostatin monthly moving forward. Appt scheduled updated to reflect this change.

## 2019-07-24 DIAGNOSIS — T82898A Other specified complication of vascular prosthetic devices, implants and grafts, initial encounter: Secondary | ICD-10-CM | POA: Diagnosis not present

## 2019-07-24 DIAGNOSIS — D509 Iron deficiency anemia, unspecified: Secondary | ICD-10-CM | POA: Diagnosis not present

## 2019-07-24 DIAGNOSIS — N186 End stage renal disease: Secondary | ICD-10-CM | POA: Diagnosis not present

## 2019-07-24 DIAGNOSIS — Z992 Dependence on renal dialysis: Secondary | ICD-10-CM | POA: Diagnosis not present

## 2019-07-24 DIAGNOSIS — Z23 Encounter for immunization: Secondary | ICD-10-CM | POA: Diagnosis not present

## 2019-07-24 DIAGNOSIS — D631 Anemia in chronic kidney disease: Secondary | ICD-10-CM | POA: Diagnosis not present

## 2019-07-25 DIAGNOSIS — M818 Other osteoporosis without current pathological fracture: Secondary | ICD-10-CM | POA: Diagnosis not present

## 2019-07-25 DIAGNOSIS — E7849 Other hyperlipidemia: Secondary | ICD-10-CM | POA: Diagnosis not present

## 2019-07-25 DIAGNOSIS — N185 Chronic kidney disease, stage 5: Secondary | ICD-10-CM | POA: Diagnosis not present

## 2019-07-25 DIAGNOSIS — I10 Essential (primary) hypertension: Secondary | ICD-10-CM | POA: Diagnosis not present

## 2019-07-26 DIAGNOSIS — N186 End stage renal disease: Secondary | ICD-10-CM | POA: Diagnosis not present

## 2019-07-26 DIAGNOSIS — D631 Anemia in chronic kidney disease: Secondary | ICD-10-CM | POA: Diagnosis not present

## 2019-07-26 DIAGNOSIS — T82898A Other specified complication of vascular prosthetic devices, implants and grafts, initial encounter: Secondary | ICD-10-CM | POA: Diagnosis not present

## 2019-07-26 DIAGNOSIS — Z23 Encounter for immunization: Secondary | ICD-10-CM | POA: Diagnosis not present

## 2019-07-26 DIAGNOSIS — D509 Iron deficiency anemia, unspecified: Secondary | ICD-10-CM | POA: Diagnosis not present

## 2019-07-26 DIAGNOSIS — Z992 Dependence on renal dialysis: Secondary | ICD-10-CM | POA: Diagnosis not present

## 2019-07-29 DIAGNOSIS — Z23 Encounter for immunization: Secondary | ICD-10-CM | POA: Diagnosis not present

## 2019-07-29 DIAGNOSIS — T82898A Other specified complication of vascular prosthetic devices, implants and grafts, initial encounter: Secondary | ICD-10-CM | POA: Diagnosis not present

## 2019-07-29 DIAGNOSIS — D509 Iron deficiency anemia, unspecified: Secondary | ICD-10-CM | POA: Diagnosis not present

## 2019-07-29 DIAGNOSIS — Z992 Dependence on renal dialysis: Secondary | ICD-10-CM | POA: Diagnosis not present

## 2019-07-29 DIAGNOSIS — N186 End stage renal disease: Secondary | ICD-10-CM | POA: Diagnosis not present

## 2019-07-29 DIAGNOSIS — D631 Anemia in chronic kidney disease: Secondary | ICD-10-CM | POA: Diagnosis not present

## 2019-07-31 ENCOUNTER — Other Ambulatory Visit (HOSPITAL_COMMUNITY)
Admission: RE | Admit: 2019-07-31 | Discharge: 2019-07-31 | Disposition: A | Discharge status: Home / Self Care | Payer: Medicare Other | Source: Ambulatory Visit | Attending: Vascular Surgery | Admitting: Vascular Surgery

## 2019-07-31 ENCOUNTER — Other Ambulatory Visit: Payer: Self-pay

## 2019-07-31 DIAGNOSIS — N186 End stage renal disease: Secondary | ICD-10-CM | POA: Diagnosis not present

## 2019-07-31 DIAGNOSIS — Z01812 Encounter for preprocedural laboratory examination: Secondary | ICD-10-CM | POA: Diagnosis not present

## 2019-07-31 DIAGNOSIS — D509 Iron deficiency anemia, unspecified: Secondary | ICD-10-CM | POA: Diagnosis not present

## 2019-07-31 DIAGNOSIS — T82898A Other specified complication of vascular prosthetic devices, implants and grafts, initial encounter: Secondary | ICD-10-CM | POA: Diagnosis not present

## 2019-07-31 DIAGNOSIS — Z20822 Contact with and (suspected) exposure to covid-19: Secondary | ICD-10-CM | POA: Diagnosis not present

## 2019-07-31 DIAGNOSIS — D631 Anemia in chronic kidney disease: Secondary | ICD-10-CM | POA: Diagnosis not present

## 2019-07-31 DIAGNOSIS — Z992 Dependence on renal dialysis: Secondary | ICD-10-CM | POA: Diagnosis not present

## 2019-07-31 DIAGNOSIS — Z23 Encounter for immunization: Secondary | ICD-10-CM | POA: Diagnosis not present

## 2019-07-31 LAB — SARS CORONAVIRUS 2 (TAT 6-24 HRS): SARS Coronavirus 2: NEGATIVE

## 2019-08-01 ENCOUNTER — Encounter (HOSPITAL_COMMUNITY): Payer: Self-pay | Admitting: Vascular Surgery

## 2019-08-01 ENCOUNTER — Other Ambulatory Visit: Payer: Self-pay

## 2019-08-01 NOTE — Progress Notes (Signed)
Spoke with pt for pre-op call. Pt denies cardiac history or Diabetes. Pt is treated for HTN and ESRD.  Pt had his Covid test done on 07/31/19, it is negative.

## 2019-08-02 DIAGNOSIS — Z992 Dependence on renal dialysis: Secondary | ICD-10-CM | POA: Diagnosis not present

## 2019-08-02 DIAGNOSIS — T82898A Other specified complication of vascular prosthetic devices, implants and grafts, initial encounter: Secondary | ICD-10-CM | POA: Diagnosis not present

## 2019-08-02 DIAGNOSIS — Z23 Encounter for immunization: Secondary | ICD-10-CM | POA: Diagnosis not present

## 2019-08-02 DIAGNOSIS — D631 Anemia in chronic kidney disease: Secondary | ICD-10-CM | POA: Diagnosis not present

## 2019-08-02 DIAGNOSIS — N186 End stage renal disease: Secondary | ICD-10-CM | POA: Diagnosis not present

## 2019-08-02 DIAGNOSIS — D509 Iron deficiency anemia, unspecified: Secondary | ICD-10-CM | POA: Diagnosis not present

## 2019-08-04 ENCOUNTER — Ambulatory Visit (HOSPITAL_COMMUNITY): Payer: Medicare Other | Admitting: Certified Registered Nurse Anesthetist

## 2019-08-04 ENCOUNTER — Ambulatory Visit (HOSPITAL_COMMUNITY)
Admission: RE | Admit: 2019-08-04 | Discharge: 2019-08-04 | Disposition: A | Discharge status: Home / Self Care | Payer: Medicare Other | Attending: Vascular Surgery | Admitting: Vascular Surgery

## 2019-08-04 ENCOUNTER — Encounter (HOSPITAL_COMMUNITY): Payer: Self-pay | Admitting: Vascular Surgery

## 2019-08-04 ENCOUNTER — Encounter (HOSPITAL_COMMUNITY)
Admission: RE | Disposition: A | Discharge status: Home / Self Care | Payer: Self-pay | Source: Home / Self Care | Attending: Vascular Surgery

## 2019-08-04 ENCOUNTER — Other Ambulatory Visit: Payer: Self-pay

## 2019-08-04 ENCOUNTER — Telehealth: Payer: Self-pay | Admitting: *Deleted

## 2019-08-04 DIAGNOSIS — T82898A Other specified complication of vascular prosthetic devices, implants and grafts, initial encounter: Secondary | ICD-10-CM | POA: Diagnosis not present

## 2019-08-04 DIAGNOSIS — I130 Hypertensive heart and chronic kidney disease with heart failure and stage 1 through stage 4 chronic kidney disease, or unspecified chronic kidney disease: Secondary | ICD-10-CM | POA: Insufficient documentation

## 2019-08-04 DIAGNOSIS — Y841 Kidney dialysis as the cause of abnormal reaction of the patient, or of later complication, without mention of misadventure at the time of the procedure: Secondary | ICD-10-CM | POA: Insufficient documentation

## 2019-08-04 DIAGNOSIS — Z992 Dependence on renal dialysis: Secondary | ICD-10-CM | POA: Insufficient documentation

## 2019-08-04 DIAGNOSIS — I132 Hypertensive heart and chronic kidney disease with heart failure and with stage 5 chronic kidney disease, or end stage renal disease: Secondary | ICD-10-CM | POA: Diagnosis not present

## 2019-08-04 DIAGNOSIS — Z79899 Other long term (current) drug therapy: Secondary | ICD-10-CM | POA: Diagnosis not present

## 2019-08-04 DIAGNOSIS — I509 Heart failure, unspecified: Secondary | ICD-10-CM | POA: Insufficient documentation

## 2019-08-04 DIAGNOSIS — N186 End stage renal disease: Secondary | ICD-10-CM

## 2019-08-04 DIAGNOSIS — E785 Hyperlipidemia, unspecified: Secondary | ICD-10-CM | POA: Insufficient documentation

## 2019-08-04 DIAGNOSIS — D631 Anemia in chronic kidney disease: Secondary | ICD-10-CM | POA: Diagnosis not present

## 2019-08-04 HISTORY — DX: Pneumonia, unspecified organism: J18.9

## 2019-08-04 HISTORY — PX: REVISON OF ARTERIOVENOUS FISTULA: SHX6074

## 2019-08-04 LAB — POCT I-STAT, CHEM 8
BUN: 40 mg/dL — ABNORMAL HIGH (ref 8–23)
Calcium, Ion: 1.08 mmol/L — ABNORMAL LOW (ref 1.15–1.40)
Chloride: 99 mmol/L (ref 98–111)
Creatinine, Ser: 3.9 mg/dL — ABNORMAL HIGH (ref 0.61–1.24)
Glucose, Bld: 111 mg/dL — ABNORMAL HIGH (ref 70–99)
HCT: 33 % — ABNORMAL LOW (ref 39.0–52.0)
Hemoglobin: 11.2 g/dL — ABNORMAL LOW (ref 13.0–17.0)
Potassium: 4.7 mmol/L (ref 3.5–5.1)
Sodium: 137 mmol/L (ref 135–145)
TCO2: 32 mmol/L (ref 22–32)

## 2019-08-04 SURGERY — REVISON OF ARTERIOVENOUS FISTULA
Anesthesia: General

## 2019-08-04 MED ORDER — DEXAMETHASONE SODIUM PHOSPHATE 10 MG/ML IJ SOLN
INTRAMUSCULAR | Status: DC | PRN
  Administered 2019-08-04: 4 mg via INTRAVENOUS

## 2019-08-04 MED ORDER — SODIUM CHLORIDE 0.9 % IV SOLN: INTRAVENOUS | Status: DC

## 2019-08-04 MED ORDER — PHENYLEPHRINE HCL-NACL 10-0.9 MG/250ML-% IV SOLN
INTRAVENOUS | Status: DC | PRN
  Administered 2019-08-04: 40 ug/min via INTRAVENOUS

## 2019-08-04 MED ORDER — HEPARIN SODIUM (PORCINE) 1000 UNIT/ML IJ SOLN
INTRAMUSCULAR | Status: AC
  Filled 2019-08-04: qty 1

## 2019-08-04 MED ORDER — ROCURONIUM BROMIDE 10 MG/ML (PF) SYRINGE
PREFILLED_SYRINGE | INTRAVENOUS | Status: AC
  Filled 2019-08-04: qty 10

## 2019-08-04 MED ORDER — FENTANYL CITRATE (PF) 250 MCG/5ML IJ SOLN
INTRAMUSCULAR | Status: AC
  Filled 2019-08-04: qty 5

## 2019-08-04 MED ORDER — OXYCODONE-ACETAMINOPHEN 5-325 MG PO TABS: 1.0000 | ORAL_TABLET | Freq: Four times a day (QID) | ORAL | 0 refills | Status: DC | PRN

## 2019-08-04 MED ORDER — PROPOFOL 10 MG/ML IV BOLUS
INTRAVENOUS | Status: AC
  Filled 2019-08-04: qty 20

## 2019-08-04 MED ORDER — 0.9 % SODIUM CHLORIDE (POUR BTL) OPTIME
TOPICAL | Status: DC | PRN
  Administered 2019-08-04: 1000 mL

## 2019-08-04 MED ORDER — PROPOFOL 10 MG/ML IV BOLUS
INTRAVENOUS | Status: DC | PRN
  Administered 2019-08-04: 20 mg via INTRAVENOUS
  Administered 2019-08-04: 150 mg via INTRAVENOUS

## 2019-08-04 MED ORDER — FENTANYL CITRATE (PF) 100 MCG/2ML IJ SOLN
INTRAMUSCULAR | Status: DC | PRN
  Administered 2019-08-04: 25 ug via INTRAVENOUS
  Administered 2019-08-04 (×2): 50 ug via INTRAVENOUS
  Administered 2019-08-04 (×2): 25 ug via INTRAVENOUS

## 2019-08-04 MED ORDER — CHLORHEXIDINE GLUCONATE 4 % EX LIQD: 60.0000 mL | Freq: Once | CUTANEOUS | Status: DC

## 2019-08-04 MED ORDER — ONDANSETRON HCL 4 MG/2ML IJ SOLN
INTRAMUSCULAR | Status: DC | PRN
  Administered 2019-08-04: 4 mg via INTRAVENOUS

## 2019-08-04 MED ORDER — SODIUM CHLORIDE 0.9 % IV SOLN
INTRAVENOUS | Status: AC
  Filled 2019-08-04: qty 1.2

## 2019-08-04 MED ORDER — LIDOCAINE HCL (PF) 1 % IJ SOLN
INTRAMUSCULAR | Status: AC
  Filled 2019-08-04: qty 30

## 2019-08-04 MED ORDER — LIDOCAINE HCL (CARDIAC) PF 100 MG/5ML IV SOSY
PREFILLED_SYRINGE | INTRAVENOUS | Status: DC | PRN
  Administered 2019-08-04: 60 mg via INTRATRACHEAL

## 2019-08-04 MED ORDER — CEFAZOLIN SODIUM-DEXTROSE 2-4 GM/100ML-% IV SOLN
2.0000 g | INTRAVENOUS | Status: AC
  Administered 2019-08-04: 09:00:00 2 g via INTRAVENOUS
  Filled 2019-08-04: qty 100

## 2019-08-04 MED ORDER — LIDOCAINE 2% (20 MG/ML) 5 ML SYRINGE
INTRAMUSCULAR | Status: AC
  Filled 2019-08-04: qty 5

## 2019-08-04 MED ORDER — SODIUM CHLORIDE 0.9 % IV SOLN: INTRAVENOUS | Status: DC | PRN

## 2019-08-04 MED ORDER — SUCCINYLCHOLINE 20MG/ML (10ML) SYRINGE FOR MEDFUSION PUMP - OPTIME
INTRAMUSCULAR | Status: DC | PRN
  Administered 2019-08-04: 60 mg via INTRAVENOUS

## 2019-08-04 SURGICAL SUPPLY — 55 items
ARMBAND PINK RESTRICT EXTREMIT (MISCELLANEOUS) ×3 IMPLANT
BAG DECANTER FOR FLEXI CONT (MISCELLANEOUS) IMPLANT
BIOPATCH RED 1 DISK 7.0 (GAUZE/BANDAGES/DRESSINGS) IMPLANT
CANISTER SUCT 3000ML PPV (MISCELLANEOUS) ×3 IMPLANT
CANNULA VESSEL 3MM 2 BLNT TIP (CANNULA) ×3 IMPLANT
CATH PALINDROME RT-P 15FX19CM (CATHETERS) IMPLANT
CATH PALINDROME RT-P 15FX23CM (CATHETERS) IMPLANT
CATH PALINDROME RT-P 15FX28CM (CATHETERS) IMPLANT
CATH PALINDROME RT-P 15FX55CM (CATHETERS) IMPLANT
CATH STRAIGHT 5FR 65CM (CATHETERS) IMPLANT
CHLORAPREP W/TINT 26 (MISCELLANEOUS) ×3 IMPLANT
CLIP VESOCCLUDE MED 6/CT (CLIP) ×3 IMPLANT
CLIP VESOCCLUDE SM WIDE 6/CT (CLIP) ×3 IMPLANT
COVER PROBE W GEL 5X96 (DRAPES) IMPLANT
COVER SURGICAL LIGHT HANDLE (MISCELLANEOUS) IMPLANT
COVER WAND RF STERILE (DRAPES) IMPLANT
DECANTER SPIKE VIAL GLASS SM (MISCELLANEOUS) ×3 IMPLANT
DERMABOND ADVANCED (GAUZE/BANDAGES/DRESSINGS) ×1
DERMABOND ADVANCED .7 DNX12 (GAUZE/BANDAGES/DRESSINGS) ×2 IMPLANT
DRAIN PENROSE 1/4X12 LTX STRL (WOUND CARE) ×3 IMPLANT
DRAPE C-ARM 42X72 X-RAY (DRAPES) IMPLANT
DRAPE CHEST BREAST 15X10 FENES (DRAPES) IMPLANT
ELECT REM PT RETURN 9FT ADLT (ELECTROSURGICAL) ×3
ELECTRODE REM PT RTRN 9FT ADLT (ELECTROSURGICAL) ×2 IMPLANT
GAUZE 4X4 16PLY RFD (DISPOSABLE) ×3 IMPLANT
GLOVE BIO SURGEON STRL SZ7.5 (GLOVE) ×3 IMPLANT
GOWN STRL REUS W/ TWL LRG LVL3 (GOWN DISPOSABLE) ×6 IMPLANT
GOWN STRL REUS W/TWL LRG LVL3 (GOWN DISPOSABLE) ×3
HEMOSTAT SPONGE AVITENE ULTRA (HEMOSTASIS) IMPLANT
KIT BASIN OR (CUSTOM PROCEDURE TRAY) ×3 IMPLANT
KIT TURNOVER KIT B (KITS) ×3 IMPLANT
LOOP VESSEL MINI RED (MISCELLANEOUS) IMPLANT
NEEDLE 18GX1X1/2 (RX/OR ONLY) (NEEDLE) ×3 IMPLANT
NEEDLE HYPO 25GX1X1/2 BEV (NEEDLE) IMPLANT
NS IRRIG 1000ML POUR BTL (IV SOLUTION) ×3 IMPLANT
PACK CV ACCESS (CUSTOM PROCEDURE TRAY) ×3 IMPLANT
PACK SURGICAL SETUP 50X90 (CUSTOM PROCEDURE TRAY) IMPLANT
PAD ARMBOARD 7.5X6 YLW CONV (MISCELLANEOUS) ×6 IMPLANT
SET MICROPUNCTURE 5F STIFF (MISCELLANEOUS) IMPLANT
SUT ETHILON 3 0 PS 1 (SUTURE) ×3 IMPLANT
SUT PROLENE 5 0 C 1 24 (SUTURE) ×6 IMPLANT
SUT PROLENE 6 0 CC (SUTURE) ×3 IMPLANT
SUT PROLENE 7 0 BV 1 (SUTURE) IMPLANT
SUT VIC AB 3-0 SH 27 (SUTURE) ×2
SUT VIC AB 3-0 SH 27X BRD (SUTURE) ×4 IMPLANT
SUT VICRYL 4-0 PS2 18IN ABS (SUTURE) ×6 IMPLANT
SYR 10ML LL (SYRINGE) ×3 IMPLANT
SYR 20ML LL LF (SYRINGE) ×6 IMPLANT
SYR 5ML LL (SYRINGE) ×3 IMPLANT
SYR CONTROL 10ML LL (SYRINGE) ×3 IMPLANT
TOWEL GREEN STERILE (TOWEL DISPOSABLE) ×6 IMPLANT
TOWEL GREEN STERILE FF (TOWEL DISPOSABLE) ×3 IMPLANT
UNDERPAD 30X30 (UNDERPADS AND DIAPERS) ×3 IMPLANT
WATER STERILE IRR 1000ML POUR (IV SOLUTION) ×3 IMPLANT
WIRE AMPLATZ SS-J .035X180CM (WIRE) IMPLANT

## 2019-08-04 NOTE — Anesthesia Procedure Notes (Signed)
Procedure Name: LMA Insertion Date/Time: 08/04/2019 9:44 AM Performed by: Leonor Liv, CRNA Pre-anesthesia Checklist: Patient identified, Emergency Drugs available, Suction available and Patient being monitored Patient Re-evaluated:Patient Re-evaluated prior to induction Oxygen Delivery Method: Circle System Utilized Preoxygenation: Pre-oxygenation with 100% oxygen Induction Type: IV induction Ventilation: Mask ventilation without difficulty LMA: LMA inserted LMA Size: 5.0 Number of attempts: 1 Airway Equipment and Method: Bite block Placement Confirmation: positive ETCO2 Tube secured with: Tape Dental Injury: Teeth and Oropharynx as per pre-operative assessment

## 2019-08-04 NOTE — Op Note (Signed)
Procedure: Ultrasound left arm AV fistula, ligation of multiple side branches  Preoperative diagnosis: Poorly functioning left arm AV fistula  Postoperative diagnosis: Same  Anesthesia: General  Assistant: Leontine Locket, PA-C  Findings: 5 side branches ranging in diameter from 91mm to 4 mm  Operative details: After obtaining form consent, patient taken the operating.  The patient placed supine position operating table.  After induction general anesthesia patient's entire left upper extremities prepped and draped in usual sterile fashion.  Ultrasound was used to examine the entirety of the left upper arm AV fistula.  There were 3 discrete areas with multiple side branches.  There was no obvious narrowing throughout the fistula.  Next a transverse incision was made over 3 discrete areas in the midportion of the fistula and the fistula dissected free circumferentially in these locations.  Multiple side branches were ligated.  1 of these was largest 4 mm.  Most of them were about 1 to 2 mm branches.  These were all ligated and divided tween silk ties.  Hemostasis was obtained.  Wounds were thoroughly irrigated normal saline solution.  Subcutaneous tissues of all 3 incisions were reapproximated with a running 3-0 Vicryl suture.  Skin of all 3 incisions closed with 4-0 Vicryl subcuticular stitch.  Dermabond was applied.  Patient tired procedure well there were no complications.  Patient stated recovery in stable condition.  Ruta Hinds, MD [facility] [place] Office: [phone]

## 2019-08-04 NOTE — Progress Notes (Signed)
Pt denies taking BB this am. HR is currently 58. Dr. Conrad Zeeland ordered to hold BB prior to surgery.   Jacqlyn Larsen, RN

## 2019-08-04 NOTE — Interval H&P Note (Signed)
History and Physical Interval Note:  08/04/2019 8:48 AM  Allen Davenport  has presented today for surgery, with the diagnosis of END STAGE RENAL DISEASE.  The various methods of treatment have been discussed with the patient and family. After consideration of risks, benefits and other options for treatment, the patient has consented to  Procedure(s): REVISON OF ARTERIOVENOUS FISTULA WITH SIDE BRANCH LIGATION (Left) INSERTION OF DIALYSIS CATHETER (N/A) as a surgical intervention.  The patient's history has been reviewed, patient examined, no change in status, stable for surgery.  I have reviewed the patient's chart and labs.  Questions were answered to the patient's satisfaction.     Ruta Hinds

## 2019-08-04 NOTE — Anesthesia Preprocedure Evaluation (Signed)
Anesthesia Evaluation  Patient identified by MRN, date of birth, ID band Patient awake    Reviewed: Allergy & Precautions, NPO status , Patient's Chart, lab work & pertinent test results  History of Anesthesia Complications Negative for: history of anesthetic complications  Airway Mallampati: II   Neck ROM: Full    Dental  (+) Partial Lower, Partial Upper, Dental Advisory Given, Teeth Intact   Pulmonary pneumonia,    Pulmonary exam normal        Cardiovascular hypertension, +CHF  Normal cardiovascular exam+ Valvular Problems/Murmurs      Neuro/Psych    GI/Hepatic   Endo/Other  Hypothyroidism   Renal/GU DialysisRenal disease     Musculoskeletal   Abdominal Normal abdominal exam  (+)   Peds  Hematology  (+) anemia ,   Anesthesia Other Findings   Reproductive/Obstetrics                             Anesthesia Physical Anesthesia Plan  ASA: III  Anesthesia Plan: General   Post-op Pain Management:    Induction: Intravenous  PONV Risk Score and Plan: 2 and Ondansetron and Dexamethasone  Airway Management Planned: LMA  Additional Equipment:   Intra-op Plan:   Post-operative Plan:   Informed Consent: I have reviewed the patients History and Physical, chart, labs and discussed the procedure including the risks, benefits and alternatives for the proposed anesthesia with the patient or authorized representative who has indicated his/her understanding and acceptance.       Plan Discussed with: Anesthesiologist  Anesthesia Plan Comments:         Anesthesia Quick Evaluation

## 2019-08-04 NOTE — Transfer of Care (Signed)
Immediate Anesthesia Transfer of Care Note  Patient: Allen Davenport  Procedure(s) Performed: REVISON OF ARTERIOVENOUS FISTULA WITH SIDE BRANCH LIGATION (Left )  Patient Location: PACU  Anesthesia Type:General  Level of Consciousness: drowsy and patient cooperative  Airway & Oxygen Therapy: Patient Spontanous Breathing and Patient connected to face mask oxygen  Post-op Assessment: Report given to RN, Post -op Vital signs reviewed and stable and Patient moving all extremities  Post vital signs: Reviewed and stable  Last Vitals:  Vitals Value Taken Time  BP 121/78 08/04/19 1117  Temp    Pulse 69 08/04/19 1117  Resp 13 08/04/19 1117  SpO2 100 % 08/04/19 1117  Vitals shown include unvalidated device data.  Last Pain:  Vitals:   08/04/19 0742  TempSrc:   PainSc: 0-No pain         Complications: No apparent anesthesia complications

## 2019-08-04 NOTE — Telephone Encounter (Signed)
Joy, nurse at patient's dialysis center, called to request office notes, plan for follow up on mutual patient. RN faxed to [phone] Landis Gandy, RN

## 2019-08-04 NOTE — Anesthesia Postprocedure Evaluation (Signed)
Anesthesia Post Note  Patient: Allen Davenport  Procedure(s) Performed: REVISON OF ARTERIOVENOUS FISTULA WITH SIDE BRANCH LIGATION (Left )     Patient location during evaluation: PACU Anesthesia Type: General Level of consciousness: awake and alert Pain management: pain level controlled Vital Signs Assessment: post-procedure vital signs reviewed and stable Respiratory status: spontaneous breathing, nonlabored ventilation, respiratory function stable and patient connected to nasal cannula oxygen Cardiovascular status: blood pressure returned to baseline and stable Postop Assessment: no apparent nausea or vomiting Anesthetic complications: no    Last Vitals:  Vitals:   08/04/19 1130 08/04/19 1145  BP: 128/70 117/68  Pulse: 60 60  Resp: 15 18  Temp:    SpO2: 100% 100%    Last Pain:  Vitals:   08/04/19 1145  TempSrc:   PainSc: Asleep                 Carissa Musick DAVID

## 2019-08-04 NOTE — Discharge Instructions (Signed)
[facility]  Discharge Instructions  AV Fistula or Graft Surgery for Dialysis Access  Please refer to the following instructions for your post-procedure care. Your surgeon or physician assistant will discuss any changes with you.  Activity  You may drive the day following your surgery, if you are comfortable and no longer taking prescription pain medication. Resume full activity as the soreness in your incision resolves.  Bathing/Showering  You may shower after you go home. Keep your incision dry for 48 hours. Do not soak in a bathtub, hot tub, or swim until the incision heals completely. You may not shower if you have a hemodialysis catheter.  Incision Care  Clean your incision with mild soap and water after 48 hours. Pat the area dry with a clean towel. You do not need a bandage unless otherwise instructed. Do not apply any ointments or creams to your incision. You may have skin glue on your incision. Do not peel it off. It will come off on its own in about one week. Your arm may swell a bit after surgery. To reduce swelling use pillows to elevate your arm so it is above your heart. Your doctor will tell you if you need to lightly wrap your arm with an ACE bandage.  Diet  Resume your normal diet. There are not special food restrictions following this procedure. In order to heal from your surgery, it is CRITICAL to get adequate nutrition. Your body requires vitamins, minerals, and protein. Vegetables are the best source of vitamins and minerals. Vegetables also provide the perfect balance of protein. Processed food has little nutritional value, so try to avoid this.  Medications  Resume taking all of your medications. If your incision is causing pain, you may take over-the counter pain relievers such as acetaminophen (Tylenol). If you were prescribed a stronger pain medication, please be aware these medications can cause nausea and constipation. Prevent  nausea by taking the medication with a snack or meal. Avoid constipation by drinking plenty of fluids and eating foods with high amount of fiber, such as fruits, vegetables, and grains.  Do not take Tylenol if you are taking prescription pain medications.  Follow up Your surgeon may want to see you in the office following your access surgery. If so, this will be arranged at the time of your surgery.  Please call us immediately for any of the following conditions:  . Increased pain, redness, drainage (pus) from your incision site . Fever of 101 degrees or higher . Severe or worsening pain at your incision site . Hand pain or numbness. .  Reduce your risk of vascular disease:  . Stop smoking. If you would like help, call [facility] at 1-800-QUIT-NOW 6174242980) or [facility] at 415-665-5561  . Manage your cholesterol . Maintain a desired weight . Control your diabetes . Keep your blood pressure down  Dialysis  It will take several weeks to several months for your new dialysis access to be ready for use. Your surgeon will determine when it is okay to use it. Your nephrologist will continue to direct your dialysis. You can continue to use your Permcath until your new access is ready for use.   08/04/2019 Allen Davenport [id number] 1941/05/06  Surgeon(s): Fields, Jessy Oto, MD  Procedure(s): REVISON OF ARTERIOVENOUS FISTULA WITH SIDE BRANCH LIGATION   x Do NOT stick over three incisions for 4 weeks. May stick above and below incision immediately. SEE DIAGRAM.    If you have  any questions, please call the office at [phone].

## 2019-08-05 ENCOUNTER — Other Ambulatory Visit (HOSPITAL_COMMUNITY): Payer: Self-pay | Admitting: *Deleted

## 2019-08-05 DIAGNOSIS — D509 Iron deficiency anemia, unspecified: Secondary | ICD-10-CM | POA: Diagnosis not present

## 2019-08-05 DIAGNOSIS — Z23 Encounter for immunization: Secondary | ICD-10-CM | POA: Diagnosis not present

## 2019-08-05 DIAGNOSIS — T82898A Other specified complication of vascular prosthetic devices, implants and grafts, initial encounter: Secondary | ICD-10-CM | POA: Diagnosis not present

## 2019-08-05 DIAGNOSIS — C7A00 Malignant carcinoid tumor of unspecified site: Secondary | ICD-10-CM

## 2019-08-05 DIAGNOSIS — Z992 Dependence on renal dialysis: Secondary | ICD-10-CM | POA: Diagnosis not present

## 2019-08-05 DIAGNOSIS — N186 End stage renal disease: Secondary | ICD-10-CM | POA: Diagnosis not present

## 2019-08-05 DIAGNOSIS — D631 Anemia in chronic kidney disease: Secondary | ICD-10-CM | POA: Diagnosis not present

## 2019-08-05 DIAGNOSIS — C179 Malignant neoplasm of small intestine, unspecified: Secondary | ICD-10-CM

## 2019-08-06 ENCOUNTER — Inpatient Hospital Stay (HOSPITAL_BASED_OUTPATIENT_CLINIC_OR_DEPARTMENT_OTHER): Payer: Medicare Other | Admitting: Hematology

## 2019-08-06 ENCOUNTER — Inpatient Hospital Stay (HOSPITAL_COMMUNITY): Payer: Medicare Other

## 2019-08-06 ENCOUNTER — Other Ambulatory Visit: Payer: Self-pay

## 2019-08-06 VITALS — BP 100/54 | HR 58 | Temp 97.8°F | Resp 16 | Wt 194.0 lb

## 2019-08-06 DIAGNOSIS — C7A00 Malignant carcinoid tumor of unspecified site: Secondary | ICD-10-CM

## 2019-08-06 DIAGNOSIS — C7B02 Secondary carcinoid tumors of liver: Secondary | ICD-10-CM | POA: Diagnosis not present

## 2019-08-06 DIAGNOSIS — C179 Malignant neoplasm of small intestine, unspecified: Secondary | ICD-10-CM

## 2019-08-06 LAB — CBC WITH DIFFERENTIAL/PLATELET
Abs Immature Granulocytes: 0.02 10*3/uL (ref 0.00–0.07)
Basophils Absolute: 0.1 10*3/uL (ref 0.0–0.1)
Basophils Relative: 1 %
Eosinophils Absolute: 0.2 10*3/uL (ref 0.0–0.5)
Eosinophils Relative: 3 %
HCT: 33.6 % — ABNORMAL LOW (ref 39.0–52.0)
Hemoglobin: 10.4 g/dL — ABNORMAL LOW (ref 13.0–17.0)
Immature Granulocytes: 0 %
Lymphocytes Relative: 12 %
Lymphs Abs: 0.7 10*3/uL (ref 0.7–4.0)
MCH: 31 pg (ref 26.0–34.0)
MCHC: 31 g/dL (ref 30.0–36.0)
MCV: 100 fL (ref 80.0–100.0)
Monocytes Absolute: 0.6 10*3/uL (ref 0.1–1.0)
Monocytes Relative: 11 %
Neutro Abs: 4.5 10*3/uL (ref 1.7–7.7)
Neutrophils Relative %: 73 %
Platelets: 138 10*3/uL — ABNORMAL LOW (ref 150–400)
RBC: 3.36 MIL/uL — ABNORMAL LOW (ref 4.22–5.81)
RDW: 17.6 % — ABNORMAL HIGH (ref 11.5–15.5)
WBC: 6.1 10*3/uL (ref 4.0–10.5)
nRBC: 0 % (ref 0.0–0.2)

## 2019-08-06 LAB — COMPREHENSIVE METABOLIC PANEL
ALT: 12 U/L (ref 0–44)
AST: 28 U/L (ref 15–41)
Albumin: 3.3 g/dL — ABNORMAL LOW (ref 3.5–5.0)
Alkaline Phosphatase: 53 U/L (ref 38–126)
Anion gap: 12 (ref 5–15)
BUN: 23 mg/dL (ref 8–23)
CO2: 32 mmol/L (ref 22–32)
Calcium: 8.4 mg/dL — ABNORMAL LOW (ref 8.9–10.3)
Chloride: 96 mmol/L — ABNORMAL LOW (ref 98–111)
Creatinine, Ser: 3.04 mg/dL — ABNORMAL HIGH (ref 0.61–1.24)
GFR calc Af Amer: 22 mL/min — ABNORMAL LOW (ref >60)
GFR calc non Af Amer: 19 mL/min — ABNORMAL LOW (ref >60)
Glucose, Bld: 97 mg/dL (ref 70–99)
Potassium: 3.9 mmol/L (ref 3.5–5.1)
Sodium: 140 mmol/L (ref 135–145)
Total Bilirubin: 0.6 mg/dL (ref 0.3–1.2)
Total Protein: 5.9 g/dL — ABNORMAL LOW (ref 6.5–8.1)

## 2019-08-06 LAB — FERRITIN: Ferritin: 454 ng/mL — ABNORMAL HIGH (ref 24–336)

## 2019-08-06 LAB — IRON AND TIBC
Iron: 114 ug/dL (ref 45–182)
Saturation Ratios: 50 % — ABNORMAL HIGH (ref 17.9–39.5)
TIBC: 226 ug/dL — ABNORMAL LOW (ref 250–450)
UIBC: 112 ug/dL

## 2019-08-06 LAB — LACTATE DEHYDROGENASE: LDH: 132 U/L (ref 98–192)

## 2019-08-06 MED ORDER — OCTREOTIDE ACETATE 30 MG IM KIT
30.0000 mg | PACK | Freq: Once | INTRAMUSCULAR | Status: AC
  Administered 2019-08-06: 30 mg via INTRAMUSCULAR

## 2019-08-06 NOTE — Patient Instructions (Signed)
[facility] Discharge Instructions  Received Sandostatin injection today. Follow-up as scheduled. Call clinic for any questions or concerns   Thank you for choosing [facility] at [facility] to provide your oncology and hematology care.  To afford each patient quality time with our provider, please arrive at least 15 minutes before your scheduled appointment time.   If you have a lab appointment with the Cancer Center please come in thru the Main Entrance and check in at the main information desk.  You need to re-schedule your appointment should you arrive 10 or more minutes late.  We strive to give you quality time with our providers, and arriving late affects you and other patients whose appointments are after yours.  Also, if you no show three or more times for appointments you may be dismissed from the clinic at the providers discretion.     Again, thank you for choosing [facility].  Our hope is that these requests will decrease the amount of time that you wait before being seen by our physicians.       _____________________________________________________________  Should you have questions after your visit to [facility], please contact our office at [phone] between the hours of 8:00 a.m. and 4:30 p.m.  Voicemails left after 4:00 p.m. will not be returned until the following business day.  For prescription refill requests, have your pharmacy contact our office and allow 72 hours.    Due to Covid, you will need to wear a mask upon entering the hospital. If you do not have a mask, a mask will be given to you at the Main Entrance upon arrival. For doctor visits, patients may have 1 support person with them. For treatment visits, patients can not have anyone with them due to social distancing guidelines and our immunocompromised population.     

## 2019-08-06 NOTE — Progress Notes (Signed)
[place], [place] [address]   CLINIC:  Medical Oncology/Hematology  PCP:  [name], MD [place] [id number] [phone]   REASON FOR VISIT:  Follow-up for metastatic carcinoid tumor  CURRENT THERAPY: Everolimus 5 mg daily and Sandostatin monthly.     INTERVAL HISTORY:  Allen Davenport 79 y.o. male seen for follow-up of metastatic carcinoid tumor to the liver and peritoneum.  Appetite and energy levels are 75%.  Leg swelling has been stable.  He is having uneventful dialysis.  He reportedly received 1 injection of Retacrit 2 weeks ago.  Denies any bleeding per rectum or melena.    REVIEW OF SYSTEMS:  Review of Systems  Cardiovascular: Positive for leg swelling.  All other systems reviewed and are negative.    PAST MEDICAL/SURGICAL HISTORY:  Past Medical History:  Diagnosis Date  . Anemia   . Cancer [facility])    right renal . Prostate- Radiation treatment  . CHF (congestive heart failure) ([place])   . Chronic kidney disease    Dialysis T/TH/Sa  . Edema   . Heart murmur    "nothing to worry about"  . Hyperlipidemia   . Hypertension   . Malignant carcinoid tumor ([place]) 01/03/2016  . Pneumonia    as a child   Past Surgical History:  Procedure Laterality Date  . AV FISTULA PLACEMENT Left 10/20/2014   Procedure: Left Arm ARTERIOVENOUS (AV) FISTULA CREATION;  Surgeon: Elam Dutch, MD;  Location: [facility];  Service: Vascular;  Laterality: Left;  . NEPHRECTOMY Right 2010  . PERIPHERAL VASCULAR BALLOON ANGIOPLASTY  06/20/2019   Procedure: PERIPHERAL VASCULAR BALLOON ANGIOPLASTY;  Surgeon: Elam Dutch, MD;  Location: [facility];  Service: Cardiovascular;;  . REVISON OF ARTERIOVENOUS FISTULA Left 08/04/2019   Procedure: REVISON OF ARTERIOVENOUS FISTULA WITH SIDE BRANCH LIGATION;  Surgeon: Elam Dutch, MD;  Location: [facility] OR;  Service: Vascular;  Laterality: Left;     SOCIAL HISTORY:  Social History    Socioeconomic History  . Marital status: Married    Spouse name: Not on file  . Number of children: Not on file  . Years of education: Not on file  . Highest education level: Not on file  Occupational History  . Not on file  Tobacco Use  . Smoking status: Never Smoker  . Smokeless tobacco: Never Used  Substance and Sexual Activity  . Alcohol use: No    Alcohol/week: 0.0 standard drinks  . Drug use: No  . Sexual activity: Not on file  Other Topics Concern  . Not on file  Social History Narrative  . Not on file   Social Determinants of Health   Financial Resource Strain:   . Difficulty of Paying Living Expenses: Not on file  Food Insecurity:   . Worried About Charity fundraiser in the Last Year: Not on file  . Ran Out of Food in the Last Year: Not on file  Transportation Needs:   . Lack of Transportation (Medical): Not on file  . Lack of Transportation (Non-Medical): Not on file  Physical Activity:   . Days of Exercise per Week: Not on file  . Minutes of Exercise per Session: Not on file  Stress:   . Feeling of Stress : Not on file  Social Connections:   . Frequency of Communication with Friends and Family: Not on file  . Frequency of Social Gatherings with Friends and Family: Not on  file  . Attends Religious Services: Not on file  . Active Member of Clubs or Organizations: Not on file  . Attends Archivist Meetings: Not on file  . Marital Status: Not on file  Intimate Partner Violence:   . Fear of Current or Ex-Partner: Not on file  . Emotionally Abused: Not on file  . Physically Abused: Not on file  . Sexually Abused: Not on file    FAMILY HISTORY:  Family History  Problem Relation Age of Onset  . Cancer Mother   . Hypertension Father   . Cancer Sister     CURRENT MEDICATIONS:  Outpatient Encounter Medications as of 08/06/2019  Medication Sig Note  . alendronate (FOSAMAX) 70 MG tablet Take 70 mg by mouth every Sunday.    [name] atorvastatin  (LIPITOR) 80 MG tablet Take 80 mg by mouth daily at 6 PM.    . calcitRIOL (ROCALTROL) 0.25 MCG capsule Take 0.25 mcg by mouth daily.    . carvedilol (COREG) 3.125 MG tablet Take 3.125 mg by mouth 2 (two) times daily with a meal.    . Cholecalciferol (VITAMIN D3) 250 MCG (10000 UT) TABS Take 10,000 Units by mouth daily.    [name] dexamethasone (DECADRON) 0.5 MG/5ML solution SWISH TEN ML BY MOUTH FOUR TIMES DAILY FOR TWO MINUTES THEN SPIT (Patient taking differently: Take 1 mg by mouth See admin instructions. SWISH 10 ml (1 mg) BY MOUTH FOUR TIMES DAILY FOR TWO MINUTES THEN SPIT)   . epoetin alfa (EPOGEN) 4000 UNIT/ML injection Inject into the skin every 30 (thirty) days.  07/31/2019: Pt doesn't know how much he gets, its done at hospital once a month  . furosemide (LASIX) 40 MG tablet Take 40 mg by mouth every morning.   . iron polysaccharides (NIFEREX) 150 MG capsule Take 150 mg by mouth daily.    [name] isoniazid (NYDRAZID) 300 MG tablet Take 1 tablet (300 mg total) by mouth daily.   . metolazone (ZAROXOLYN) 5 MG tablet Take 5 mg by mouth every other day.   [name] octreotide (SANDOSTATIN LAR DEPOT) 30 MG injection Inject 30 mg into the muscle every 28 (twenty-eight) days.    . Omega-3 Fatty Acids (FISH OIL) 1000 MG CAPS Take 1,000 mg by mouth daily.    [name] pyridOXINE (B-6) 50 MG tablet Take 1 tablet (50 mg total) by mouth daily.   . sodium bicarbonate 650 MG tablet Take 650 mg by mouth daily.   . tamsulosin (FLOMAX) 0.4 MG CAPS capsule Take 0.4 mg by mouth daily.    [name] oxyCODONE-acetaminophen (PERCOCET) 5-325 MG tablet Take 1 tablet by mouth every 6 (six) hours as needed. (Patient not taking: Reported on 08/06/2019)    No facility-administered encounter medications on file as of 08/06/2019.    ALLERGIES:  No Known Allergies   PHYSICAL EXAM:  ECOG Performance status: 1  Vitals:   08/06/19 1045  BP: (!) 100/54  Pulse: (!) 58  Resp: 16  Temp: 97.8 F (36.6 C)  SpO2: 100%   Filed Weights   08/06/19 1045   Weight: 194 lb (88 kg)    Physical Exam Vitals reviewed.  Constitutional:      Appearance: Normal appearance.  Cardiovascular:     Rate and Rhythm: Normal rate and regular rhythm.     Heart sounds: Normal heart sounds.  Pulmonary:     Effort: Pulmonary effort is normal.     Breath sounds: Normal breath sounds.  Abdominal:     General: There is no  distension.     Palpations: Abdomen is soft. There is no mass.  Musculoskeletal:        General: No swelling.  Skin:    General: Skin is warm.  Neurological:     General: No focal deficit present.     Mental Status: He is alert and oriented to person, place, and time.  Psychiatric:        Mood and Affect: Mood normal.        Behavior: Behavior normal.      LABORATORY DATA:  I have reviewed the labs as listed.  CBC    Component Value Date/Time   WBC 6.1 08/06/2019 1027   RBC 3.36 (L) 08/06/2019 1027   HGB 10.4 (L) 08/06/2019 1027   HCT 33.6 (L) 08/06/2019 1027   PLT 138 (L) 08/06/2019 1027   MCV 100.0 08/06/2019 1027   MCH 31.0 08/06/2019 1027   MCHC 31.0 08/06/2019 1027   RDW 17.6 (H) 08/06/2019 1027   LYMPHSABS 0.7 08/06/2019 1027   MONOABS 0.6 08/06/2019 1027   EOSABS 0.2 08/06/2019 1027   BASOSABS 0.1 08/06/2019 1027   CMP Latest Ref Rng & Units 08/06/2019 08/04/2019 07/02/2019  Glucose 70 - 99 mg/dL 97 111(H) 145(H)  BUN 8 - 23 mg/dL 23 40(H) 17  Creatinine 0.61 - 1.24 mg/dL 3.04(H) 3.90(H) 2.73(H)  Sodium 135 - 145 mmol/L 140 137 141  Potassium 3.5 - 5.1 mmol/L 3.9 4.7 3.9  Chloride 98 - 111 mmol/L 96(L) 99 102  CO2 22 - 32 mmol/L 32 - 29  Calcium 8.9 - 10.3 mg/dL 8.4(L) - 8.3(L)  Total Protein 6.5 - 8.1 g/dL 5.9(L) - 5.2(L)  Total Bilirubin 0.3 - 1.2 mg/dL 0.6 - 0.5  Alkaline Phos 38 - 126 U/L 53 - -  AST 15 - 41 U/L 28 - 29  ALT 0 - 44 U/L 12 - 22       DIAGNOSTIC IMAGING:  I have independently reviewed the scans and discussed with the patient.   I have reviewed Venita Lick LPN's note and agree  with the documentation.  I personally performed a face-to-face visit, made revisions and my assessment and plan is as follows.    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  Initially managed at [facility] center.  Transferred care to [facility] in October 2017. - Gallium-68 scan on 10/23/2017 shows metastatic disease in the liver, central right mesenteric mass and multiple peritoneal deposits. -Gallium-68 dotatate PET scan on 04/22/2018 showed multiple foci of intense radiotracer activity consistent with metastatic well-differentiated carcinoid tumor.  Metastatic sites include central mesentery, multiple peritoneal implants, liver metastasis.  Several metastatic lesions have measured higher activity.  Additional mild increase in size of central mesenteric mass.  Lesion within the right ventricle along the interventricular septum thought to be metastatic. - 2D echo on 05/03/2018 showed normal cavity size of the right ventricle with normal systolic function.  -Everolimus 5 mg daily started on 05/17/2018 along with dexamethasone mouthwash. -Gallium-68 dotatate scan on 04/08/2019 revealed no evidence of disease progression.  Overall the exam is stable compared to prior scan in May 2020.  There was a new nodularity in the right lung base that was non-FDG avid. -He was admitted to the hospital on 06/07/2019 with a hemoglobin of 5.7.  He received blood transfusion. -Everolimus has been on hold since 06/07/2019.  Last Sandostatin was  on 06/09/2019. -I have reviewed his labs.  Last Sandostatin was on 07/09/2019.  He will proceed with his Sandostatin today. -He denies any carcinoid syndrome symptoms.  I have reviewed his labs.  White count and platelets are grossly normal. -I have recommended him to start back on everolimus 5 mg at this time. -I will see him back in 2 weeks for follow-up.   2.   Normocytic anemia: -We have reached out to his dialysis clinic and requested to start on erythropoiesis stimulating agents. -He reportedly received 1 dose of Retacrit 2 weeks ago. -Hemoglobin today is 10.4.  Ferritin is pending at this time.  3.  ESRD on HD: -Started HD on 05/20/2019.  Tuesday Thursday and Saturday. -Continue Lasix as needed for leg swelling.     Orders placed this encounter:  Orders Placed This Encounter  Procedures  . CBC with Differential  . Hepatic function panel      Derek Jack, MD [place] [phone]

## 2019-08-06 NOTE — Patient Instructions (Signed)
Allen Davenport at [facility] Discharge Instructions  You were seen today by Dr. Delton Coombes. He went over your recent lab results. Continue Sandostatin injection monthly. Restart your Affinator daily. He will see you back in 2 weeks for labs and follow up.   Thank you for choosing [place] at [facility] to provide your oncology and hematology care.  To afford each patient quality time with our provider, please arrive at least 15 minutes before your scheduled appointment time.   If you have a lab appointment with the [facility] please come in thru the  Main Entrance and check in at the main information desk  You need to re-schedule your appointment should you arrive 10 or more minutes late.  We strive to give you quality time with our providers, and arriving late affects you and other patients whose appointments are after yours.  Also, if you no show three or more times for appointments you may be dismissed from the clinic at the providers discretion.     Again, thank you for choosing [facility].  Our hope is that these requests will decrease the amount of time that you wait before being seen by our physicians.       _____________________________________________________________  Should you have questions after your visit to [facility], please contact our office at [phone] between the hours of 8:00 a.m. and 4:30 p.m.  Voicemails left after 4:00 p.m. will not be returned until the following business day.  For prescription refill requests, have your pharmacy contact our office and allow 72 hours.    Cancer Center Support Programs:   > Cancer Support Group  2nd Tuesday of the month 1pm-2pm, Journey Room

## 2019-08-06 NOTE — Progress Notes (Signed)
1119 Labs reviewed with and pt seen by Dr. Delton Coombes and pt approved for Sandostatin injection today per MD                            Allen Davenport tolerated Sandostatin injection well without complaints or incident. VSS Pt discharged self ambulatory in satisfactory condition

## 2019-08-07 DIAGNOSIS — N186 End stage renal disease: Secondary | ICD-10-CM | POA: Diagnosis not present

## 2019-08-07 DIAGNOSIS — Z23 Encounter for immunization: Secondary | ICD-10-CM | POA: Diagnosis not present

## 2019-08-07 DIAGNOSIS — T82898A Other specified complication of vascular prosthetic devices, implants and grafts, initial encounter: Secondary | ICD-10-CM | POA: Diagnosis not present

## 2019-08-07 DIAGNOSIS — D631 Anemia in chronic kidney disease: Secondary | ICD-10-CM | POA: Diagnosis not present

## 2019-08-07 DIAGNOSIS — D509 Iron deficiency anemia, unspecified: Secondary | ICD-10-CM | POA: Diagnosis not present

## 2019-08-07 DIAGNOSIS — Z992 Dependence on renal dialysis: Secondary | ICD-10-CM | POA: Diagnosis not present

## 2019-08-08 DIAGNOSIS — D509 Iron deficiency anemia, unspecified: Secondary | ICD-10-CM | POA: Diagnosis not present

## 2019-08-08 DIAGNOSIS — D631 Anemia in chronic kidney disease: Secondary | ICD-10-CM | POA: Diagnosis not present

## 2019-08-08 DIAGNOSIS — N186 End stage renal disease: Secondary | ICD-10-CM | POA: Diagnosis not present

## 2019-08-08 DIAGNOSIS — Z23 Encounter for immunization: Secondary | ICD-10-CM | POA: Diagnosis not present

## 2019-08-08 DIAGNOSIS — T82898A Other specified complication of vascular prosthetic devices, implants and grafts, initial encounter: Secondary | ICD-10-CM | POA: Diagnosis not present

## 2019-08-08 DIAGNOSIS — Z992 Dependence on renal dialysis: Secondary | ICD-10-CM | POA: Diagnosis not present

## 2019-08-09 DIAGNOSIS — T82898A Other specified complication of vascular prosthetic devices, implants and grafts, initial encounter: Secondary | ICD-10-CM | POA: Diagnosis not present

## 2019-08-09 DIAGNOSIS — Z23 Encounter for immunization: Secondary | ICD-10-CM | POA: Diagnosis not present

## 2019-08-09 DIAGNOSIS — Z992 Dependence on renal dialysis: Secondary | ICD-10-CM | POA: Diagnosis not present

## 2019-08-09 DIAGNOSIS — D509 Iron deficiency anemia, unspecified: Secondary | ICD-10-CM | POA: Diagnosis not present

## 2019-08-09 DIAGNOSIS — D631 Anemia in chronic kidney disease: Secondary | ICD-10-CM | POA: Diagnosis not present

## 2019-08-09 DIAGNOSIS — N186 End stage renal disease: Secondary | ICD-10-CM | POA: Diagnosis not present

## 2019-08-09 NOTE — Assessment & Plan Note (Signed)
1.  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  Initially managed at [facility].  Transferred care to [facility] in October 2017. - Gallium-68 scan on 10/23/2017 shows metastatic disease in the liver, central right mesenteric mass and multiple peritoneal deposits. -Gallium-68 dotatate PET scan on 04/22/2018 showed multiple foci of intense radiotracer activity consistent with metastatic well-differentiated carcinoid tumor.  Metastatic sites include central mesentery, multiple peritoneal implants, liver metastasis.  Several metastatic lesions have measured higher activity.  Additional mild increase in size of central mesenteric mass.  Lesion within the right ventricle along the interventricular septum thought to be metastatic. - 2D echo on 05/03/2018 showed normal cavity size of the right ventricle with normal systolic function.  -Everolimus 5 mg daily started on 05/17/2018 along with dexamethasone mouthwash. -Gallium-68 dotatate scan on 04/08/2019 revealed no evidence of disease progression.  Overall the exam is stable compared to prior scan in May 2020.  There was a new nodularity in the right lung base that was non-FDG avid. -He was admitted to the hospital on 06/07/2019 with a hemoglobin of 5.7.  He received blood transfusion. -Everolimus has been on hold since 06/07/2019.  Last Sandostatin was on 06/09/2019. -I have reviewed his labs.  Last Sandostatin was on 07/09/2019.  He will proceed with his Sandostatin today. -He denies any carcinoid syndrome symptoms.  I have reviewed his labs.  White count and platelets are grossly normal. -I have recommended him to start back on everolimus 5 mg at this time. -I will see him back in 2 weeks for follow-up.   2.  Normocytic anemia: -We have reached out to his dialysis clinic and requested to start on erythropoiesis stimulating agents. -He reportedly received 1 dose of Retacrit 2 weeks  ago. -Hemoglobin today is 10.4.  Ferritin is pending at this time.  3.  ESRD on HD: -Started HD on 05/20/2019.  Tuesday Thursday and Saturday. -Continue Lasix as needed for leg swelling.

## 2019-08-12 DIAGNOSIS — Z23 Encounter for immunization: Secondary | ICD-10-CM | POA: Diagnosis not present

## 2019-08-12 DIAGNOSIS — N186 End stage renal disease: Secondary | ICD-10-CM | POA: Diagnosis not present

## 2019-08-12 DIAGNOSIS — D509 Iron deficiency anemia, unspecified: Secondary | ICD-10-CM | POA: Diagnosis not present

## 2019-08-12 DIAGNOSIS — D631 Anemia in chronic kidney disease: Secondary | ICD-10-CM | POA: Diagnosis not present

## 2019-08-12 DIAGNOSIS — Z992 Dependence on renal dialysis: Secondary | ICD-10-CM | POA: Diagnosis not present

## 2019-08-12 DIAGNOSIS — T82898A Other specified complication of vascular prosthetic devices, implants and grafts, initial encounter: Secondary | ICD-10-CM | POA: Diagnosis not present

## 2019-08-14 DIAGNOSIS — Z23 Encounter for immunization: Secondary | ICD-10-CM | POA: Diagnosis not present

## 2019-08-14 DIAGNOSIS — T82898A Other specified complication of vascular prosthetic devices, implants and grafts, initial encounter: Secondary | ICD-10-CM | POA: Diagnosis not present

## 2019-08-14 DIAGNOSIS — N186 End stage renal disease: Secondary | ICD-10-CM | POA: Diagnosis not present

## 2019-08-14 DIAGNOSIS — Z992 Dependence on renal dialysis: Secondary | ICD-10-CM | POA: Diagnosis not present

## 2019-08-14 DIAGNOSIS — D509 Iron deficiency anemia, unspecified: Secondary | ICD-10-CM | POA: Diagnosis not present

## 2019-08-14 DIAGNOSIS — D631 Anemia in chronic kidney disease: Secondary | ICD-10-CM | POA: Diagnosis not present

## 2019-08-16 DIAGNOSIS — Z23 Encounter for immunization: Secondary | ICD-10-CM | POA: Diagnosis not present

## 2019-08-16 DIAGNOSIS — Z992 Dependence on renal dialysis: Secondary | ICD-10-CM | POA: Diagnosis not present

## 2019-08-16 DIAGNOSIS — D509 Iron deficiency anemia, unspecified: Secondary | ICD-10-CM | POA: Diagnosis not present

## 2019-08-16 DIAGNOSIS — D631 Anemia in chronic kidney disease: Secondary | ICD-10-CM | POA: Diagnosis not present

## 2019-08-16 DIAGNOSIS — N186 End stage renal disease: Secondary | ICD-10-CM | POA: Diagnosis not present

## 2019-08-16 DIAGNOSIS — T82898A Other specified complication of vascular prosthetic devices, implants and grafts, initial encounter: Secondary | ICD-10-CM | POA: Diagnosis not present

## 2019-08-17 DIAGNOSIS — Z992 Dependence on renal dialysis: Secondary | ICD-10-CM | POA: Diagnosis not present

## 2019-08-17 DIAGNOSIS — N186 End stage renal disease: Secondary | ICD-10-CM | POA: Diagnosis not present

## 2019-08-19 DIAGNOSIS — N186 End stage renal disease: Secondary | ICD-10-CM | POA: Diagnosis not present

## 2019-08-19 DIAGNOSIS — D631 Anemia in chronic kidney disease: Secondary | ICD-10-CM | POA: Diagnosis not present

## 2019-08-19 DIAGNOSIS — D509 Iron deficiency anemia, unspecified: Secondary | ICD-10-CM | POA: Diagnosis not present

## 2019-08-19 DIAGNOSIS — Z23 Encounter for immunization: Secondary | ICD-10-CM | POA: Diagnosis not present

## 2019-08-19 DIAGNOSIS — Z992 Dependence on renal dialysis: Secondary | ICD-10-CM | POA: Diagnosis not present

## 2019-08-20 ENCOUNTER — Inpatient Hospital Stay (HOSPITAL_COMMUNITY): Payer: Medicare Other

## 2019-08-20 ENCOUNTER — Other Ambulatory Visit: Payer: Self-pay

## 2019-08-20 ENCOUNTER — Inpatient Hospital Stay (HOSPITAL_COMMUNITY): Payer: Medicare Other | Attending: Hematology | Admitting: Hematology

## 2019-08-20 ENCOUNTER — Encounter (HOSPITAL_COMMUNITY): Payer: Self-pay | Admitting: Hematology

## 2019-08-20 VITALS — BP 105/57 | HR 66 | Temp 97.8°F | Resp 16 | Wt 191.5 lb

## 2019-08-20 DIAGNOSIS — Z992 Dependence on renal dialysis: Secondary | ICD-10-CM | POA: Diagnosis not present

## 2019-08-20 DIAGNOSIS — C7B02 Secondary carcinoid tumors of liver: Secondary | ICD-10-CM | POA: Diagnosis not present

## 2019-08-20 DIAGNOSIS — C7A00 Malignant carcinoid tumor of unspecified site: Secondary | ICD-10-CM

## 2019-08-20 DIAGNOSIS — N186 End stage renal disease: Secondary | ICD-10-CM | POA: Insufficient documentation

## 2019-08-20 LAB — HEPATIC FUNCTION PANEL
ALT: 15 U/L (ref 0–44)
AST: 35 U/L (ref 15–41)
Albumin: 3.6 g/dL (ref 3.5–5.0)
Alkaline Phosphatase: 65 U/L (ref 38–126)
Bilirubin, Direct: 0.1 mg/dL (ref 0.0–0.2)
Indirect Bilirubin: 0.4 mg/dL (ref 0.3–0.9)
Total Bilirubin: 0.5 mg/dL (ref 0.3–1.2)
Total Protein: 6.4 g/dL — ABNORMAL LOW (ref 6.5–8.1)

## 2019-08-20 LAB — CBC WITH DIFFERENTIAL/PLATELET
Abs Immature Granulocytes: 0.01 10*3/uL (ref 0.00–0.07)
Basophils Absolute: 0 10*3/uL (ref 0.0–0.1)
Basophils Relative: 0 %
Eosinophils Absolute: 0.1 10*3/uL (ref 0.0–0.5)
Eosinophils Relative: 3 %
HCT: 35.4 % — ABNORMAL LOW (ref 39.0–52.0)
Hemoglobin: 11.3 g/dL — ABNORMAL LOW (ref 13.0–17.0)
Immature Granulocytes: 0 %
Lymphocytes Relative: 15 %
Lymphs Abs: 0.6 10*3/uL — ABNORMAL LOW (ref 0.7–4.0)
MCH: 30.7 pg (ref 26.0–34.0)
MCHC: 31.9 g/dL (ref 30.0–36.0)
MCV: 96.2 fL (ref 80.0–100.0)
Monocytes Absolute: 0.4 10*3/uL (ref 0.1–1.0)
Monocytes Relative: 11 %
Neutro Abs: 2.9 10*3/uL (ref 1.7–7.7)
Neutrophils Relative %: 71 %
Platelets: 136 10*3/uL — ABNORMAL LOW (ref 150–400)
RBC: 3.68 MIL/uL — ABNORMAL LOW (ref 4.22–5.81)
RDW: 14.6 % (ref 11.5–15.5)
WBC: 4.1 10*3/uL (ref 4.0–10.5)
nRBC: 0 % (ref 0.0–0.2)

## 2019-08-20 NOTE — Assessment & Plan Note (Signed)
1.  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  Initially managed at [facility].  Transferred care to [facility] in October 2017. - Gallium-68 scan on 10/23/2017 shows metastatic disease in the liver, central right mesenteric mass and multiple peritoneal deposits. -Gallium-68 dotatate PET scan on 04/22/2018 showed multiple foci of intense radiotracer activity consistent with metastatic well-differentiated carcinoid tumor.  Metastatic sites include central mesentery, multiple peritoneal implants, liver metastasis.  Several metastatic lesions have measured higher activity.  Additional mild increase in size of central mesenteric mass.  Lesion within the right ventricle along the interventricular septum thought to be metastatic. - 2D echo on 05/03/2018 showed normal cavity size of the right ventricle with normal systolic function.  -Everolimus 5 mg daily started on 05/17/2018 along with dexamethasone mouthwash. -Gallium-68 dotatate scan on 04/08/2019 revealed no evidence of disease progression.  Overall the exam is stable compared to prior scan in May 2020.  There was a new nodularity in the right lung base that was non-FDG avid. -He was admitted to the hospital on 06/07/2019 with a hemoglobin of 5.7.  He received blood transfusion. -Everolimus has been on hold since 06/07/2019. -Everolimus was started back on 08/09/2019.  He denies any nausea, vomiting or diarrhea. -We reviewed his blood work.  White count is 4.1, hemoglobin is 11.3 and platelet count 136.  LFTs are within normal limits. -He will continue everolimus at this time.  We will see him back in 2 weeks for follow-up.  After that we will switch him to every 4-week visits.   2.  Normocytic anemia: -He is receiving Epogen at the dialysis clinic.  CBC today shows hemoglobin 11.3. -Last ferritin was 454 and percent saturation was 50.  3.  ESRD on HD: -Started HD on 05/20/2019.  Tuesday  Thursday and Saturday. -Continue Lasix as needed for leg swelling.

## 2019-08-20 NOTE — Patient Instructions (Signed)
[place] at [facility] Discharge Instructions  You were seen today by Dr. Delton Coombes. He went over your recent lab results. Continue injections every 2 weeks. He will see you back in 2 weeks for labs and follow up.   Thank you for choosing [place] at [facility] to provide your oncology and hematology care.  To afford each patient quality time with our provider, please arrive at least 15 minutes before your scheduled appointment time.   If you have a lab appointment with the [facility] please come in thru the  Main Entrance and check in at the main information desk  You need to re-schedule your appointment should you arrive 10 or more minutes late.  We strive to give you quality time with our providers, and arriving late affects you and other patients whose appointments are after yours.  Also, if you no show three or more times for appointments you may be dismissed from the clinic at the providers discretion.     Again, thank you for choosing [facility].  Our hope is that these requests will decrease the amount of time that you wait before being seen by our physicians.       _____________________________________________________________  Should you have questions after your visit to [facility], please contact our office at [phone] between the hours of 8:00 a.m. and 4:30 p.m.  Voicemails left after 4:00 p.m. will not be returned until the following business day.  For prescription refill requests, have your pharmacy contact our office and allow 72 hours.    Cancer Center Support Programs:   > Cancer Support Group  2nd Tuesday of the month 1pm-2pm, Journey Room

## 2019-08-20 NOTE — Progress Notes (Signed)
[place], [place] [address]   CLINIC:  Medical Oncology/Hematology  PCP:  [name], MD [place] [id number] [phone]   REASON FOR VISIT:  Follow-up for metastatic carcinoid tumor  CURRENT THERAPY: Everolimus 5 mg daily and Sandostatin monthly.     INTERVAL HISTORY:  [place] 79 y.o. male seen for follow-up of metastatic carcinoid tumor.  Started taking everolimus 2 weeks ago.  Appetite and energy levels are 75%.  Denies any nausea, vomiting, diarrhea or constipation.  He is having dialysis without any problems.    REVIEW OF SYSTEMS:  Review of Systems  Cardiovascular: Positive for leg swelling.  All other systems reviewed and are negative.    PAST MEDICAL/SURGICAL HISTORY:  Past Medical History:  Diagnosis Date  . Anemia   . Cancer [facility])    right renal . Prostate- Radiation treatment  . CHF (congestive heart failure) ([place])   . Chronic kidney disease    Dialysis T/TH/Sa  . Edema   . Heart murmur    "nothing to worry about"  . Hyperlipidemia   . Hypertension   . Malignant carcinoid tumor ([place]) 01/03/2016  . Pneumonia    as a child   Past Surgical History:  Procedure Laterality Date  . AV FISTULA PLACEMENT Left 10/20/2014   Procedure: Left Arm ARTERIOVENOUS (AV) FISTULA CREATION;  Surgeon: Elam Dutch, MD;  Location: [facility];  Service: Vascular;  Laterality: Left;  . NEPHRECTOMY Right 2010  . PERIPHERAL VASCULAR BALLOON ANGIOPLASTY  06/20/2019   Procedure: PERIPHERAL VASCULAR BALLOON ANGIOPLASTY;  Surgeon: Elam Dutch, MD;  Location: [facility] [facility] CV LAB;  Service: Cardiovascular;;  . REVISON OF ARTERIOVENOUS FISTULA Left 08/04/2019   Procedure: REVISON OF ARTERIOVENOUS FISTULA WITH SIDE BRANCH LIGATION;  Surgeon: Elam Dutch, MD;  Location: [facility] OR;  Service: Vascular;  Laterality: Left;     SOCIAL HISTORY:  Social History   Socioeconomic History  . Marital status: Married   Spouse name: Not on file  . Number of children: Not on file  . Years of education: Not on file  . Highest education level: Not on file  Occupational History  . Not on file  Tobacco Use  . Smoking status: Never Smoker  . Smokeless tobacco: Never Used  Substance and Sexual Activity  . Alcohol use: No    Alcohol/week: 0.0 standard drinks  . Drug use: No  . Sexual activity: Not on file  Other Topics Concern  . Not on file  Social History Narrative  . Not on file   Social Determinants of Health   Financial Resource Strain:   . Difficulty of Paying Living Expenses: Not on file  Food Insecurity:   . Worried About Charity fundraiser in the Last Year: Not on file  . Ran Out of Food in the Last Year: Not on file  Transportation Needs:   . Lack of Transportation (Medical): Not on file  . Lack of Transportation (Non-Medical): Not on file  Physical Activity:   . Days of Exercise per Week: Not on file  . Minutes of Exercise per Session: Not on file  Stress:   . Feeling of Stress : Not on file  Social Connections:   . Frequency of Communication with Friends and Family: Not on file  . Frequency of Social Gatherings with Friends and Family: Not on file  . Attends Religious Services: Not on file  . Active Member of Clubs  or Organizations: Not on file  . Attends Archivist Meetings: Not on file  . Marital Status: Not on file  Intimate Partner Violence:   . Fear of Current or Ex-Partner: Not on file  . Emotionally Abused: Not on file  . Physically Abused: Not on file  . Sexually Abused: Not on file    FAMILY HISTORY:  Family History  Problem Relation Age of Onset  . Cancer Mother   . Hypertension Father   . Cancer Sister     CURRENT MEDICATIONS:  Outpatient Encounter Medications as of 08/20/2019  Medication Sig Note  . alendronate (FOSAMAX) 70 MG tablet Take 70 mg by mouth every Sunday.    [name] atorvastatin (LIPITOR) 80 MG tablet Take 80 mg by mouth daily at 6 PM.    .  calcitRIOL (ROCALTROL) 0.25 MCG capsule Take 0.25 mcg by mouth daily.    . carvedilol (COREG) 3.125 MG tablet Take 3.125 mg by mouth 2 (two) times daily with a meal.    . Cholecalciferol (VITAMIN D3) 250 MCG (10000 UT) TABS Take 10,000 Units by mouth daily.    [name] dexamethasone (DECADRON) 0.5 MG/5ML solution SWISH TEN ML BY MOUTH FOUR TIMES DAILY FOR TWO MINUTES THEN SPIT (Patient taking differently: Take 1 mg by mouth See admin instructions. SWISH 10 ml (1 mg) BY MOUTH FOUR TIMES DAILY FOR TWO MINUTES THEN SPIT)   . epoetin alfa (EPOGEN) 4000 UNIT/ML injection Inject into the skin every 30 (thirty) days.  07/31/2019: Pt doesn't know how much he gets, its done at hospital once a month  . furosemide (LASIX) 40 MG tablet Take 40 mg by mouth every morning.   . iron polysaccharides (NIFEREX) 150 MG capsule Take 150 mg by mouth daily.    [name] isoniazid (NYDRAZID) 300 MG tablet Take 1 tablet (300 mg total) by mouth daily.   . metolazone (ZAROXOLYN) 5 MG tablet Take 5 mg by mouth every other day.   [name] octreotide (SANDOSTATIN LAR DEPOT) 30 MG injection Inject 30 mg into the muscle every 28 (twenty-eight) days.    . Omega-3 Fatty Acids (FISH OIL) 1000 MG CAPS Take 1,000 mg by mouth daily.    [name] pyridOXINE (B-6) 50 MG tablet Take 1 tablet (50 mg total) by mouth daily.   . sodium bicarbonate 650 MG tablet Take 650 mg by mouth daily.   . tamsulosin (FLOMAX) 0.4 MG CAPS capsule Take 0.4 mg by mouth daily.    . [DISCONTINUED] oxyCODONE-acetaminophen (PERCOCET) 5-325 MG tablet Take 1 tablet by mouth every 6 (six) hours as needed. (Patient not taking: Reported on 08/06/2019)    No facility-administered encounter medications on file as of 08/20/2019.    ALLERGIES:  No Known Allergies   PHYSICAL EXAM:  ECOG Performance status: 1  Vitals:   08/20/19 1414  BP: (!) 105/57  Pulse: 66  Resp: 16  Temp: 97.8 F (36.6 C)  SpO2: 100%   Filed Weights   08/20/19 1414  Weight: 191 lb 8 oz (86.9 kg)    Physical Exam  Vitals reviewed.  Constitutional:      Appearance: Normal appearance.  Cardiovascular:     Rate and Rhythm: Normal rate and regular rhythm.     Heart sounds: Normal heart sounds.  Pulmonary:     Effort: Pulmonary effort is normal.     Breath sounds: Normal breath sounds.  Abdominal:     General: There is no distension.     Palpations: Abdomen is soft. There is no mass.  Musculoskeletal:        General: No swelling.  Skin:    General: Skin is warm.  Neurological:     General: No focal deficit present.     Mental Status: He is alert and oriented to person, place, and time.  Psychiatric:        Mood and Affect: Mood normal.        Behavior: Behavior normal.      LABORATORY DATA:  I have reviewed the labs as listed.  CBC    Component Value Date/Time   WBC 4.1 08/20/2019 1342   RBC 3.68 (L) 08/20/2019 1342   HGB 11.3 (L) 08/20/2019 1342   HCT 35.4 (L) 08/20/2019 1342   PLT 136 (L) 08/20/2019 1342   MCV 96.2 08/20/2019 1342   MCH 30.7 08/20/2019 1342   MCHC 31.9 08/20/2019 1342   RDW 14.6 08/20/2019 1342   LYMPHSABS 0.6 (L) 08/20/2019 1342   MONOABS 0.4 08/20/2019 1342   EOSABS 0.1 08/20/2019 1342   BASOSABS 0.0 08/20/2019 1342   CMP Latest Ref Rng & Units 08/20/2019 08/06/2019 08/04/2019  Glucose 70 - 99 mg/dL - 97 111(H)  BUN 8 - 23 mg/dL - 23 40(H)  Creatinine 0.61 - 1.24 mg/dL - 3.04(H) 3.90(H)  Sodium 135 - 145 mmol/L - 140 137  Potassium 3.5 - 5.1 mmol/L - 3.9 4.7  Chloride 98 - 111 mmol/L - 96(L) 99  CO2 22 - 32 mmol/L - 32 -  Calcium 8.9 - 10.3 mg/dL - 8.4(L) -  Total Protein 6.5 - 8.1 g/dL 6.4(L) 5.9(L) -  Total Bilirubin 0.3 - 1.2 mg/dL 0.5 0.6 -  Alkaline Phos 38 - 126 U/L 65 53 -  AST 15 - 41 U/L 35 28 -  ALT 0 - 44 U/L 15 12 -       DIAGNOSTIC IMAGING:  I have independently reviewed the scans and discussed with the patient.   I have reviewed Venita Lick LPN's note and agree with the documentation.  I personally performed a face-to-face visit,  made revisions and my assessment and plan is as follows.    ASSESSMENT & PLAN:   Malignant carcinoid tumor ([place]) 1.  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  Initially managed at [facility].  Transferred care to [facility] in October 2017. - Gallium-68 scan on 10/23/2017 shows metastatic disease in the liver, central right mesenteric mass and multiple peritoneal deposits. -Gallium-68 dotatate PET scan on 04/22/2018 showed multiple foci of intense radiotracer activity consistent with metastatic well-differentiated carcinoid tumor.  Metastatic sites include central mesentery, multiple peritoneal implants, liver metastasis.  Several metastatic lesions have measured higher activity.  Additional mild increase in size of central mesenteric mass.  Lesion within the right ventricle along the interventricular septum thought to be metastatic. - 2D echo on 05/03/2018 showed normal cavity size of the right ventricle with normal systolic function.  -Everolimus 5 mg daily started on 05/17/2018 along with dexamethasone mouthwash. -Gallium-68 dotatate scan on 04/08/2019 revealed no evidence of disease progression.  Overall the exam is stable compared to prior scan in May 2020.  There was a new nodularity in the right lung base that was non-FDG avid. -He was admitted to the hospital on 06/07/2019 with a hemoglobin of 5.7.  He received blood transfusion. -Everolimus has been on hold since 06/07/2019. -Everolimus was started back on 08/09/2019.  He denies any nausea, vomiting or diarrhea. -We reviewed his blood  work.  Dema Severin count is 4.1, hemoglobin is 11.3 and platelet count 136.  LFTs are within normal limits. -He will continue everolimus at this time.  We will see him back in 2 weeks for follow-up.  After that we will switch him to every 4-week visits.   2.  Normocytic anemia: -He is receiving Epogen at the dialysis clinic.  CBC today shows  hemoglobin 11.3. -Last ferritin was 454 and percent saturation was 50.  3.  ESRD on HD: -Started HD on 05/20/2019.  Tuesday Thursday and Saturday. -Continue Lasix as needed for leg swelling.     Orders placed this encounter:  Orders Placed This Encounter  Procedures  . CBC with Differential  . Hepatic function panel      Derek Jack, MD [place] [phone]

## 2019-08-21 DIAGNOSIS — Z23 Encounter for immunization: Secondary | ICD-10-CM | POA: Diagnosis not present

## 2019-08-21 DIAGNOSIS — N186 End stage renal disease: Secondary | ICD-10-CM | POA: Diagnosis not present

## 2019-08-21 DIAGNOSIS — Z992 Dependence on renal dialysis: Secondary | ICD-10-CM | POA: Diagnosis not present

## 2019-08-21 DIAGNOSIS — D631 Anemia in chronic kidney disease: Secondary | ICD-10-CM | POA: Diagnosis not present

## 2019-08-21 DIAGNOSIS — D509 Iron deficiency anemia, unspecified: Secondary | ICD-10-CM | POA: Diagnosis not present

## 2019-08-23 DIAGNOSIS — D509 Iron deficiency anemia, unspecified: Secondary | ICD-10-CM | POA: Diagnosis not present

## 2019-08-23 DIAGNOSIS — N186 End stage renal disease: Secondary | ICD-10-CM | POA: Diagnosis not present

## 2019-08-23 DIAGNOSIS — Z23 Encounter for immunization: Secondary | ICD-10-CM | POA: Diagnosis not present

## 2019-08-23 DIAGNOSIS — Z992 Dependence on renal dialysis: Secondary | ICD-10-CM | POA: Diagnosis not present

## 2019-08-23 DIAGNOSIS — D631 Anemia in chronic kidney disease: Secondary | ICD-10-CM | POA: Diagnosis not present

## 2019-08-26 DIAGNOSIS — D509 Iron deficiency anemia, unspecified: Secondary | ICD-10-CM | POA: Diagnosis not present

## 2019-08-26 DIAGNOSIS — Z23 Encounter for immunization: Secondary | ICD-10-CM | POA: Diagnosis not present

## 2019-08-26 DIAGNOSIS — N186 End stage renal disease: Secondary | ICD-10-CM | POA: Diagnosis not present

## 2019-08-26 DIAGNOSIS — D631 Anemia in chronic kidney disease: Secondary | ICD-10-CM | POA: Diagnosis not present

## 2019-08-26 DIAGNOSIS — Z992 Dependence on renal dialysis: Secondary | ICD-10-CM | POA: Diagnosis not present

## 2019-08-28 DIAGNOSIS — Z23 Encounter for immunization: Secondary | ICD-10-CM | POA: Diagnosis not present

## 2019-08-28 DIAGNOSIS — Z992 Dependence on renal dialysis: Secondary | ICD-10-CM | POA: Diagnosis not present

## 2019-08-28 DIAGNOSIS — N186 End stage renal disease: Secondary | ICD-10-CM | POA: Diagnosis not present

## 2019-08-28 DIAGNOSIS — D631 Anemia in chronic kidney disease: Secondary | ICD-10-CM | POA: Diagnosis not present

## 2019-08-28 DIAGNOSIS — D509 Iron deficiency anemia, unspecified: Secondary | ICD-10-CM | POA: Diagnosis not present

## 2019-08-30 DIAGNOSIS — N186 End stage renal disease: Secondary | ICD-10-CM | POA: Diagnosis not present

## 2019-08-30 DIAGNOSIS — Z992 Dependence on renal dialysis: Secondary | ICD-10-CM | POA: Diagnosis not present

## 2019-08-30 DIAGNOSIS — Z23 Encounter for immunization: Secondary | ICD-10-CM | POA: Diagnosis not present

## 2019-08-30 DIAGNOSIS — D631 Anemia in chronic kidney disease: Secondary | ICD-10-CM | POA: Diagnosis not present

## 2019-08-30 DIAGNOSIS — D509 Iron deficiency anemia, unspecified: Secondary | ICD-10-CM | POA: Diagnosis not present

## 2019-09-02 DIAGNOSIS — N186 End stage renal disease: Secondary | ICD-10-CM | POA: Diagnosis not present

## 2019-09-02 DIAGNOSIS — Z23 Encounter for immunization: Secondary | ICD-10-CM | POA: Diagnosis not present

## 2019-09-02 DIAGNOSIS — D509 Iron deficiency anemia, unspecified: Secondary | ICD-10-CM | POA: Diagnosis not present

## 2019-09-02 DIAGNOSIS — Z992 Dependence on renal dialysis: Secondary | ICD-10-CM | POA: Diagnosis not present

## 2019-09-02 DIAGNOSIS — D631 Anemia in chronic kidney disease: Secondary | ICD-10-CM | POA: Diagnosis not present

## 2019-09-03 ENCOUNTER — Inpatient Hospital Stay (HOSPITAL_COMMUNITY): Payer: Medicare Other

## 2019-09-03 ENCOUNTER — Inpatient Hospital Stay (HOSPITAL_BASED_OUTPATIENT_CLINIC_OR_DEPARTMENT_OTHER): Payer: Medicare Other | Admitting: Nurse Practitioner

## 2019-09-03 ENCOUNTER — Other Ambulatory Visit: Payer: Self-pay

## 2019-09-03 ENCOUNTER — Encounter (HOSPITAL_COMMUNITY): Payer: Self-pay | Admitting: Nurse Practitioner

## 2019-09-03 DIAGNOSIS — C179 Malignant neoplasm of small intestine, unspecified: Secondary | ICD-10-CM

## 2019-09-03 DIAGNOSIS — Z992 Dependence on renal dialysis: Secondary | ICD-10-CM | POA: Diagnosis not present

## 2019-09-03 DIAGNOSIS — C7A00 Malignant carcinoid tumor of unspecified site: Secondary | ICD-10-CM

## 2019-09-03 DIAGNOSIS — N186 End stage renal disease: Secondary | ICD-10-CM | POA: Diagnosis not present

## 2019-09-03 DIAGNOSIS — C7B02 Secondary carcinoid tumors of liver: Secondary | ICD-10-CM | POA: Diagnosis not present

## 2019-09-03 LAB — CBC WITH DIFFERENTIAL/PLATELET
Abs Immature Granulocytes: 0.01 10*3/uL (ref 0.00–0.07)
Basophils Absolute: 0 10*3/uL (ref 0.0–0.1)
Basophils Relative: 0 %
Eosinophils Absolute: 0.1 10*3/uL (ref 0.0–0.5)
Eosinophils Relative: 2 %
HCT: 37.8 % — ABNORMAL LOW (ref 39.0–52.0)
Hemoglobin: 12.1 g/dL — ABNORMAL LOW (ref 13.0–17.0)
Immature Granulocytes: 0 %
Lymphocytes Relative: 11 %
Lymphs Abs: 0.5 10*3/uL — ABNORMAL LOW (ref 0.7–4.0)
MCH: 30.3 pg (ref 26.0–34.0)
MCHC: 32 g/dL (ref 30.0–36.0)
MCV: 94.5 fL (ref 80.0–100.0)
Monocytes Absolute: 0.7 10*3/uL (ref 0.1–1.0)
Monocytes Relative: 14 %
Neutro Abs: 3.6 10*3/uL (ref 1.7–7.7)
Neutrophils Relative %: 73 %
Platelets: 159 10*3/uL (ref 150–400)
RBC: 4 MIL/uL — ABNORMAL LOW (ref 4.22–5.81)
RDW: 13.8 % (ref 11.5–15.5)
WBC: 4.9 10*3/uL (ref 4.0–10.5)
nRBC: 0 % (ref 0.0–0.2)

## 2019-09-03 LAB — HEPATIC FUNCTION PANEL
ALT: 17 U/L (ref 0–44)
AST: 37 U/L (ref 15–41)
Albumin: 3.7 g/dL (ref 3.5–5.0)
Alkaline Phosphatase: 66 U/L (ref 38–126)
Bilirubin, Direct: 0.1 mg/dL (ref 0.0–0.2)
Indirect Bilirubin: 0.5 mg/dL (ref 0.3–0.9)
Total Bilirubin: 0.6 mg/dL (ref 0.3–1.2)
Total Protein: 6.8 g/dL (ref 6.5–8.1)

## 2019-09-03 MED ORDER — OCTREOTIDE ACETATE 30 MG IM KIT
30.0000 mg | PACK | Freq: Once | INTRAMUSCULAR | Status: AC
  Administered 2019-09-03: 30 mg via INTRAMUSCULAR

## 2019-09-03 NOTE — Patient Instructions (Addendum)
[place] at [facility]  Discharge Instructions: Follow up in 4 weeks with labs   You saw Francene Finders, NP, today. _______________________________________________________________  Thank you for choosing [place] at [facility] to provide your oncology and hematology care.  To afford each patient quality time with our providers, please arrive at least 15 minutes before your scheduled appointment.  You need to re-schedule your appointment if you arrive 10 or more minutes late.  We strive to give you quality time with our providers, and arriving late affects you and other patients whose appointments are after yours.  Also, if you no show three or more times for appointments you may be dismissed from the clinic.  Again, thank you for choosing [place] at [facility] hope is that these requests will allow you access to exceptional care and in a timely manner. _______________________________________________________________  If you have questions after your visit, please contact our office at [phone] between the hours of 8:30 a.m. and 5:00 p.m. Voicemails left after 4:30 p.m. will not be returned until the following business day. _______________________________________________________________  For prescription refill requests, have your pharmacy contact our office. _______________________________________________________________  Recommendations made by the consultant and any test results will be sent to your referring physician. _______________________________________________________________

## 2019-09-03 NOTE — Progress Notes (Signed)
[name], [place] [address]   CLINIC:  Medical Oncology/Hematology  PCP:  [name], MD [place] [phone]   REASON FOR VISIT: Follow-up for metastatic carcinoid tumor  CURRENT THERAPY: Everolimus   INTERVAL HISTORY:  Allen [place]port 79 y.o. male returns for routine follow-up for metastatic carcinoid tumor. Patient reports he has been doing well since restarting his medication. He has no complaints at this time. He does still have bilateral leg swelling. Denies any nausea, vomiting, or diarrhea. Denies any new pains. Had not noticed any recent bleeding such as epistaxis, hematuria or hematochezia. Denies recent chest pain on exertion, shortness of breath on minimal exertion, pre-syncopal episodes, or palpitations. Denies any numbness or tingling in hands or feet. Denies any recent fevers, infections, or recent hospitalizations. Patient reports appetite at 75% and energy level at 75%. He is eating well maintain his weight this time.    REVIEW OF SYSTEMS:  Review of Systems  Cardiovascular: Positive for leg swelling.  All other systems reviewed and are negative.    PAST MEDICAL/SURGICAL HISTORY:  Past Medical History:  Diagnosis Date  . Anemia   . Cancer [facility])    right renal . Prostate- Radiation treatment  . CHF (congestive heart failure) ([place])   . Chronic kidney disease    Dialysis T/TH/Sa  . Edema   . Heart murmur    "nothing to worry about"  . Hyperlipidemia   . Hypertension   . Malignant carcinoid tumor ([place]) 01/03/2016  . Pneumonia    as a child   Past Surgical History:  Procedure Laterality Date  . AV FISTULA PLACEMENT Left 10/20/2014   Procedure: Left Arm ARTERIOVENOUS (AV) FISTULA CREATION;  Surgeon: Elam Dutch, MD;  Location: [facility];  Service: Vascular;  Laterality: Left;  . NEPHRECTOMY Right 2010  . PERIPHERAL VASCULAR BALLOON ANGIOPLASTY  06/20/2019   Procedure: PERIPHERAL  VASCULAR BALLOON ANGIOPLASTY;  Surgeon: Elam Dutch, MD;  Location: [facility];  Service: Cardiovascular;;  . REVISON OF ARTERIOVENOUS FISTULA Left 08/04/2019   Procedure: REVISON OF ARTERIOVENOUS FISTULA WITH SIDE BRANCH LIGATION;  Surgeon: Elam Dutch, MD;  Location: [facility] OR;  Service: Vascular;  Laterality: Left;     SOCIAL HISTORY:  Social History   Socioeconomic History  . Marital status: Married    Spouse name: Not on file  . Number of children: Not on file  . Years of education: Not on file  . Highest education level: Not on file  Occupational History  . Not on file  Tobacco Use  . Smoking status: Never Smoker  . Smokeless tobacco: Never Used  Substance and Sexual Activity  . Alcohol use: No    Alcohol/week: 0.0 standard drinks  . Drug use: No  . Sexual activity: Not on file  Other Topics Concern  . Not on file  Social History Narrative  . Not on file   Social Determinants of Health   Financial Resource Strain:   . Difficulty of Paying Living Expenses: Not on file  Food Insecurity:   . Worried About Charity fundraiser in the Last Year: Not on file  . Ran Out of Food in the Last Year: Not on file  Transportation Needs:   . Lack of Transportation (Medical): Not on file  . Lack of Transportation (Non-Medical): Not on file  Physical Activity:   . Days of Exercise per Week: Not on file  .  Minutes of Exercise per Session: Not on file  Stress:   . Feeling of Stress : Not on file  Social Connections:   . Frequency of Communication with Friends and Family: Not on file  . Frequency of Social Gatherings with Friends and Family: Not on file  . Attends Religious Services: Not on file  . Active Member of Clubs or Organizations: Not on file  . Attends Archivist Meetings: Not on file  . Marital Status: Not on file  Intimate Partner Violence:   . Fear of Current or Ex-Partner: Not on file  . Emotionally Abused: Not on file  . Physically  Abused: Not on file  . Sexually Abused: Not on file    FAMILY HISTORY:  Family History  Problem Relation Age of Onset  . Cancer Mother   . Hypertension Father   . Cancer Sister     CURRENT MEDICATIONS:  Outpatient Encounter Medications as of 09/03/2019  Medication Sig Note  . alendronate (FOSAMAX) 70 MG tablet Take 70 mg by mouth every Sunday.    [name] amLODipine (NORVASC) 10 MG tablet Take 10 mg by mouth daily.   [name] atorvastatin (LIPITOR) 80 MG tablet Take 80 mg by mouth daily at 6 PM.    . calcitRIOL (ROCALTROL) 0.25 MCG capsule Take 0.25 mcg by mouth daily.    . carvedilol (COREG) 3.125 MG tablet Take 3.125 mg by mouth 2 (two) times daily with a meal.    . Cholecalciferol (VITAMIN D3) 250 MCG (10000 UT) TABS Take 10,000 Units by mouth daily.    [name] dexamethasone (DECADRON) 0.5 MG/5ML solution SWISH TEN ML BY MOUTH FOUR TIMES DAILY FOR TWO MINUTES THEN SPIT (Patient taking differently: Take 1 mg by mouth See admin instructions. SWISH 10 ml (1 mg) BY MOUTH FOUR TIMES DAILY FOR TWO MINUTES THEN SPIT)   . epoetin alfa (EPOGEN) 4000 UNIT/ML injection Inject into the skin every 30 (thirty) days.  07/31/2019: Pt doesn't know how much he gets, its done at hospital once a month  . furosemide (LASIX) 40 MG tablet Take 40 mg by mouth every morning.   . iron polysaccharides (NIFEREX) 150 MG capsule Take 150 mg by mouth daily.    [name] isoniazid (NYDRAZID) 300 MG tablet Take 1 tablet (300 mg total) by mouth daily.   . metolazone (ZAROXOLYN) 5 MG tablet Take 5 mg by mouth every other day.   [name] octreotide (SANDOSTATIN LAR DEPOT) 30 MG injection Inject 30 mg into the muscle every 28 (twenty-eight) days.    . Omega-3 Fatty Acids (FISH OIL) 1000 MG CAPS Take 1,000 mg by mouth daily.    [name] pyridOXINE (B-6) 50 MG tablet Take 1 tablet (50 mg total) by mouth daily.   . sodium bicarbonate 650 MG tablet Take 650 mg by mouth daily.   . tamsulosin (FLOMAX) 0.4 MG CAPS capsule Take 0.4 mg by mouth daily.     No  facility-administered encounter medications on file as of 09/03/2019.    ALLERGIES:  No Known Allergies   PHYSICAL EXAM:  ECOG Performance status: 1  Vitals:   09/03/19 1147  BP: 94/60  Pulse: 65  Resp: 16  Temp: (!) 97.1 F (36.2 C)  SpO2: 100%   Filed Weights   09/03/19 1147  Weight: 190 lb 12.8 oz (86.5 kg)    Physical Exam Constitutional:      Appearance: Normal appearance. He is normal weight.  Cardiovascular:     Rate and Rhythm: Normal rate and regular rhythm.  Heart sounds: Normal heart sounds.  Pulmonary:     Effort: Pulmonary effort is normal.     Breath sounds: Normal breath sounds.  Abdominal:     General: Bowel sounds are normal.     Palpations: Abdomen is soft.  Musculoskeletal:        General: Normal range of motion.  Skin:    General: Skin is warm.  Neurological:     Mental Status: He is alert and oriented to person, place, and time. Mental status is at baseline.  Psychiatric:        Mood and Affect: Mood normal.        Behavior: Behavior normal.        Thought Content: Thought content normal.        Judgment: Judgment normal.      LABORATORY DATA:  I have reviewed the labs as listed.  CBC    Component Value Date/Time   WBC 4.9 09/03/2019 1050   RBC 4.00 (L) 09/03/2019 1050   HGB 12.1 (L) 09/03/2019 1050   HCT 37.8 (L) 09/03/2019 1050   PLT 159 09/03/2019 1050   MCV 94.5 09/03/2019 1050   MCH 30.3 09/03/2019 1050   MCHC 32.0 09/03/2019 1050   RDW 13.8 09/03/2019 1050   LYMPHSABS 0.5 (L) 09/03/2019 1050   MONOABS 0.7 09/03/2019 1050   EOSABS 0.1 09/03/2019 1050   BASOSABS 0.0 09/03/2019 1050   CMP Latest Ref Rng & Units 09/03/2019 08/20/2019 08/06/2019  Glucose 70 - 99 mg/dL - - 97  BUN 8 - 23 mg/dL - - 23  Creatinine 0.61 - 1.24 mg/dL - - 3.04(H)  Sodium 135 - 145 mmol/L - - 140  Potassium 3.5 - 5.1 mmol/L - - 3.9  Chloride 98 - 111 mmol/L - - 96(L)  CO2 22 - 32 mmol/L - - 32  Calcium 8.9 - 10.3 mg/dL - - 8.4(L)  Total  Protein 6.5 - 8.1 g/dL 6.8 6.4(L) 5.9(L)  Total Bilirubin 0.3 - 1.2 mg/dL 0.6 0.5 0.6  Alkaline Phos 38 - 126 U/L 66 65 53  AST 15 - 41 U/L 37 35 28  ALT 0 - 44 U/L 17 15 12     I personally performed a face-to-face visit.  All questions were answered to patient's stated satisfaction. Encouraged patient to call with any new concerns or questions before his next visit to the cancer center and we can certain see him sooner, if needed.     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  Metastatic carcinoid tumor to the liver and peritoneum: - [facility].  He transferred his care to [facility] in October 2017. - He has been on Sandostatin for over 5 years. - Gallium-68 PET CT scan on 04/22/2018 showed multiple foci of intense radiotracer activity consistent with metastatic well differentiated carcinoid tumor.  Metastatic sites include central mesentery, multiple peritoneal implants, liver metastasis.  Several metastatic lesions have measured higher activity.  Additional mild increase in size of central mesenteric mass. -2D echo on 05/03/2018 showed normal cavity size of the right ventricle with normal systolic function.  EF was 60 to 65%. - Everolimus 5 mg daily started on 05/17/2018 along with dexamethasone mouthwash. -She denies any symptoms of carcinoid syndrome including flushing, wheezing or diarrhea.  He denies any abdominal pains. -He does report bilateral ankle and feet swelling over the past few weeks.  His Lasix has helped decrease this.  He has not had  any more mouth sores since using the dexamethasone mouthwash. - He will continue monthly octreotide injections. -She will continue everolimus at the same dose for now. -Gallium-68 dotatate scan on 04/08/2019 revealed no evidence of disease progression. Overall the exam is stable compared to prior scan in May 2020. There was a new nodularity in the right lung base that was non-FDG  avid. -Patient was admitted to the hospital 06/07/2019 with a hemoglobin of 5.7. He received a blood transfusion at this time. -Everolimus has been on hold since 06/07/2019. -Everolimus was started back on 08/09/2019. He denies any nausea, vomiting or diarrhea. -We reviewed his labs done on 09/03/2019 which showed LFTs WNL, hemoglobin 12.1, WBC 4.9, platelets 159. -He will continue monthly octreotide injections. He will continue taking everolimus. -We'll see him back in 4 weeks with repeat labs.  2.  Normocytic anemia: -This is from a combination of anemia from CKD, iron deficiency, and bone marrow suppression from the everolimus. -He receives intermittent Feraheme infusions.  The last ones were on 08/19/2018 and 08/26/2018. - He has started on Procrit 20,000 units every 14 days which can be titrated up as needed.  He has received 2 doses so far. He receives Epogen at the dialysis clinic. -Labs on 09/03/2019 showed hemoglobin of 12.1 which is an increase from last visit.   -He denies any bleeding.   3.  Head and neck cancer: -Status post surgery and radiation therapy in 2009. -He is in remission at this time.  4.  Kidney cancer: -Status post right nephrectomy in May 2010 at [place].  5.  Prostate cancer: -Status post seed implantation by Dr. Tammi Klippel in 2016. -He follows up with his urologist.  6. ESRD on hemodialysis: -Started on hemodialysis 05/20/2019. Tuesday Thursday Saturday schedule -He continues Lasix as needed for leg swelling. -He receives Epogen at his dialysis facility.        Orders placed this encounter:  Orders Placed This Encounter  Procedures  . Lactate dehydrogenase  . CBC with Differential/Platelet  . Comprehensive metabolic panel     Francene Finders, FNP-C [place] [phone]

## 2019-09-03 NOTE — Assessment & Plan Note (Signed)
1.  Metastatic carcinoid tumor to the liver and peritoneum: - [facility].  He transferred his care to [facility] in October 2017. - He has been on Sandostatin for over 5 years. - Gallium-68 PET CT scan on 04/22/2018 showed multiple foci of intense radiotracer activity consistent with metastatic well differentiated carcinoid tumor.  Metastatic sites include central mesentery, multiple peritoneal implants, liver metastasis.  Several metastatic lesions have measured higher activity.  Additional mild increase in size of central mesenteric mass. -2D echo on 05/03/2018 showed normal cavity size of the right ventricle with normal systolic function.  EF was 60 to 65%. - Everolimus 5 mg daily started on 05/17/2018 along with dexamethasone mouthwash. -She denies any symptoms of carcinoid syndrome including flushing, wheezing or diarrhea.  He denies any abdominal pains. -He does report bilateral ankle and feet swelling over the past few weeks.  His Lasix has helped decrease this.  He has not had any more mouth sores since using the dexamethasone mouthwash. - He will continue monthly octreotide injections. -She will continue everolimus at the same dose for now. -Gallium-68 dotatate scan on 04/08/2019 revealed no evidence of disease progression. Overall the exam is stable compared to prior scan in May 2020. There was a new nodularity in the right lung base that was non-FDG avid. -Patient was admitted to the hospital 06/07/2019 with a hemoglobin of 5.7. He received a blood transfusion at this time. -Everolimus has been on hold since 06/07/2019. -Everolimus was started back on 08/09/2019. He denies any nausea, vomiting or diarrhea. -We reviewed his labs done on 09/03/2019 which showed LFTs WNL, hemoglobin 12.1, WBC 4.9, platelets 159. -He will continue monthly octreotide injections. He will continue taking everolimus. -We'll see him back in 4 weeks with repeat  labs.  2.  Normocytic anemia: -This is from a combination of anemia from CKD, iron deficiency, and bone marrow suppression from the everolimus. -He receives intermittent Feraheme infusions.  The last ones were on 08/19/2018 and 08/26/2018. - He has started on Procrit 20,000 units every 14 days which can be titrated up as needed.  He has received 2 doses so far. He receives Epogen at the dialysis clinic. -Labs on 09/03/2019 showed hemoglobin of 12.1 which is an increase from last visit.   -He denies any bleeding.   3.  Head and neck cancer: -Status post surgery and radiation therapy in 2009. -He is in remission at this time.  4.  Kidney cancer: -Status post right nephrectomy in May 2010 at [place].  5.  Prostate cancer: -Status post seed implantation by Dr. Tammi Klippel in 2016. -He follows up with his urologist.  6. ESRD on hemodialysis: -Started on hemodialysis 05/20/2019. Tuesday Thursday Saturday schedule -He continues Lasix as needed for leg swelling. -He receives Epogen at his dialysis facility.

## 2019-09-03 NOTE — Progress Notes (Signed)
Sandostatin injection given per orders. Patient tolerated it well without problems. Vitals stable and discharged home from clinic ambulatory. Follow up as scheduled.  

## 2019-09-04 DIAGNOSIS — D631 Anemia in chronic kidney disease: Secondary | ICD-10-CM | POA: Diagnosis not present

## 2019-09-04 DIAGNOSIS — Z992 Dependence on renal dialysis: Secondary | ICD-10-CM | POA: Diagnosis not present

## 2019-09-04 DIAGNOSIS — D509 Iron deficiency anemia, unspecified: Secondary | ICD-10-CM | POA: Diagnosis not present

## 2019-09-04 DIAGNOSIS — Z23 Encounter for immunization: Secondary | ICD-10-CM | POA: Diagnosis not present

## 2019-09-04 DIAGNOSIS — N186 End stage renal disease: Secondary | ICD-10-CM | POA: Diagnosis not present

## 2019-09-06 DIAGNOSIS — N186 End stage renal disease: Secondary | ICD-10-CM | POA: Diagnosis not present

## 2019-09-06 DIAGNOSIS — Z23 Encounter for immunization: Secondary | ICD-10-CM | POA: Diagnosis not present

## 2019-09-06 DIAGNOSIS — D631 Anemia in chronic kidney disease: Secondary | ICD-10-CM | POA: Diagnosis not present

## 2019-09-06 DIAGNOSIS — Z992 Dependence on renal dialysis: Secondary | ICD-10-CM | POA: Diagnosis not present

## 2019-09-06 DIAGNOSIS — D509 Iron deficiency anemia, unspecified: Secondary | ICD-10-CM | POA: Diagnosis not present

## 2019-09-08 DIAGNOSIS — M818 Other osteoporosis without current pathological fracture: Secondary | ICD-10-CM | POA: Diagnosis not present

## 2019-09-08 DIAGNOSIS — N185 Chronic kidney disease, stage 5: Secondary | ICD-10-CM | POA: Diagnosis not present

## 2019-09-08 DIAGNOSIS — E7849 Other hyperlipidemia: Secondary | ICD-10-CM | POA: Diagnosis not present

## 2019-09-08 DIAGNOSIS — I10 Essential (primary) hypertension: Secondary | ICD-10-CM | POA: Diagnosis not present

## 2019-09-09 DIAGNOSIS — Z992 Dependence on renal dialysis: Secondary | ICD-10-CM | POA: Diagnosis not present

## 2019-09-09 DIAGNOSIS — D509 Iron deficiency anemia, unspecified: Secondary | ICD-10-CM | POA: Diagnosis not present

## 2019-09-09 DIAGNOSIS — D631 Anemia in chronic kidney disease: Secondary | ICD-10-CM | POA: Diagnosis not present

## 2019-09-09 DIAGNOSIS — Z23 Encounter for immunization: Secondary | ICD-10-CM | POA: Diagnosis not present

## 2019-09-09 DIAGNOSIS — N186 End stage renal disease: Secondary | ICD-10-CM | POA: Diagnosis not present

## 2019-09-10 DIAGNOSIS — I10 Essential (primary) hypertension: Secondary | ICD-10-CM | POA: Diagnosis not present

## 2019-09-10 DIAGNOSIS — N185 Chronic kidney disease, stage 5: Secondary | ICD-10-CM | POA: Diagnosis not present

## 2019-09-10 DIAGNOSIS — M818 Other osteoporosis without current pathological fracture: Secondary | ICD-10-CM | POA: Diagnosis not present

## 2019-09-10 DIAGNOSIS — E7849 Other hyperlipidemia: Secondary | ICD-10-CM | POA: Diagnosis not present

## 2019-09-11 DIAGNOSIS — Z23 Encounter for immunization: Secondary | ICD-10-CM | POA: Diagnosis not present

## 2019-09-11 DIAGNOSIS — N186 End stage renal disease: Secondary | ICD-10-CM | POA: Diagnosis not present

## 2019-09-11 DIAGNOSIS — D631 Anemia in chronic kidney disease: Secondary | ICD-10-CM | POA: Diagnosis not present

## 2019-09-11 DIAGNOSIS — D509 Iron deficiency anemia, unspecified: Secondary | ICD-10-CM | POA: Diagnosis not present

## 2019-09-11 DIAGNOSIS — Z992 Dependence on renal dialysis: Secondary | ICD-10-CM | POA: Diagnosis not present

## 2019-09-13 DIAGNOSIS — N186 End stage renal disease: Secondary | ICD-10-CM | POA: Diagnosis not present

## 2019-09-13 DIAGNOSIS — Z992 Dependence on renal dialysis: Secondary | ICD-10-CM | POA: Diagnosis not present

## 2019-09-13 DIAGNOSIS — D631 Anemia in chronic kidney disease: Secondary | ICD-10-CM | POA: Diagnosis not present

## 2019-09-13 DIAGNOSIS — Z23 Encounter for immunization: Secondary | ICD-10-CM | POA: Diagnosis not present

## 2019-09-13 DIAGNOSIS — D509 Iron deficiency anemia, unspecified: Secondary | ICD-10-CM | POA: Diagnosis not present

## 2019-09-14 DIAGNOSIS — Z992 Dependence on renal dialysis: Secondary | ICD-10-CM | POA: Diagnosis not present

## 2019-09-14 DIAGNOSIS — N186 End stage renal disease: Secondary | ICD-10-CM | POA: Diagnosis not present

## 2019-09-16 DIAGNOSIS — D631 Anemia in chronic kidney disease: Secondary | ICD-10-CM | POA: Diagnosis not present

## 2019-09-16 DIAGNOSIS — N2581 Secondary hyperparathyroidism of renal origin: Secondary | ICD-10-CM | POA: Diagnosis not present

## 2019-09-16 DIAGNOSIS — N186 End stage renal disease: Secondary | ICD-10-CM | POA: Diagnosis not present

## 2019-09-16 DIAGNOSIS — Z992 Dependence on renal dialysis: Secondary | ICD-10-CM | POA: Diagnosis not present

## 2019-09-16 DIAGNOSIS — D509 Iron deficiency anemia, unspecified: Secondary | ICD-10-CM | POA: Diagnosis not present

## 2019-09-18 DIAGNOSIS — D631 Anemia in chronic kidney disease: Secondary | ICD-10-CM | POA: Diagnosis not present

## 2019-09-18 DIAGNOSIS — N2581 Secondary hyperparathyroidism of renal origin: Secondary | ICD-10-CM | POA: Diagnosis not present

## 2019-09-18 DIAGNOSIS — D509 Iron deficiency anemia, unspecified: Secondary | ICD-10-CM | POA: Diagnosis not present

## 2019-09-18 DIAGNOSIS — N186 End stage renal disease: Secondary | ICD-10-CM | POA: Diagnosis not present

## 2019-09-18 DIAGNOSIS — Z992 Dependence on renal dialysis: Secondary | ICD-10-CM | POA: Diagnosis not present

## 2019-09-20 DIAGNOSIS — D631 Anemia in chronic kidney disease: Secondary | ICD-10-CM | POA: Diagnosis not present

## 2019-09-20 DIAGNOSIS — N186 End stage renal disease: Secondary | ICD-10-CM | POA: Diagnosis not present

## 2019-09-20 DIAGNOSIS — N2581 Secondary hyperparathyroidism of renal origin: Secondary | ICD-10-CM | POA: Diagnosis not present

## 2019-09-20 DIAGNOSIS — Z992 Dependence on renal dialysis: Secondary | ICD-10-CM | POA: Diagnosis not present

## 2019-09-20 DIAGNOSIS — D509 Iron deficiency anemia, unspecified: Secondary | ICD-10-CM | POA: Diagnosis not present

## 2019-09-23 DIAGNOSIS — N186 End stage renal disease: Secondary | ICD-10-CM | POA: Diagnosis not present

## 2019-09-23 DIAGNOSIS — D631 Anemia in chronic kidney disease: Secondary | ICD-10-CM | POA: Diagnosis not present

## 2019-09-23 DIAGNOSIS — N2581 Secondary hyperparathyroidism of renal origin: Secondary | ICD-10-CM | POA: Diagnosis not present

## 2019-09-23 DIAGNOSIS — Z992 Dependence on renal dialysis: Secondary | ICD-10-CM | POA: Diagnosis not present

## 2019-09-23 DIAGNOSIS — D509 Iron deficiency anemia, unspecified: Secondary | ICD-10-CM | POA: Diagnosis not present

## 2019-09-25 DIAGNOSIS — D631 Anemia in chronic kidney disease: Secondary | ICD-10-CM | POA: Diagnosis not present

## 2019-09-25 DIAGNOSIS — N186 End stage renal disease: Secondary | ICD-10-CM | POA: Diagnosis not present

## 2019-09-25 DIAGNOSIS — D509 Iron deficiency anemia, unspecified: Secondary | ICD-10-CM | POA: Diagnosis not present

## 2019-09-25 DIAGNOSIS — Z992 Dependence on renal dialysis: Secondary | ICD-10-CM | POA: Diagnosis not present

## 2019-09-25 DIAGNOSIS — N2581 Secondary hyperparathyroidism of renal origin: Secondary | ICD-10-CM | POA: Diagnosis not present

## 2019-09-26 DIAGNOSIS — I10 Essential (primary) hypertension: Secondary | ICD-10-CM | POA: Diagnosis not present

## 2019-09-26 DIAGNOSIS — M818 Other osteoporosis without current pathological fracture: Secondary | ICD-10-CM | POA: Diagnosis not present

## 2019-09-26 DIAGNOSIS — E7849 Other hyperlipidemia: Secondary | ICD-10-CM | POA: Diagnosis not present

## 2019-09-26 DIAGNOSIS — Z23 Encounter for immunization: Secondary | ICD-10-CM | POA: Diagnosis not present

## 2019-09-26 DIAGNOSIS — N185 Chronic kidney disease, stage 5: Secondary | ICD-10-CM | POA: Diagnosis not present

## 2019-09-27 DIAGNOSIS — N186 End stage renal disease: Secondary | ICD-10-CM | POA: Diagnosis not present

## 2019-09-27 DIAGNOSIS — Z992 Dependence on renal dialysis: Secondary | ICD-10-CM | POA: Diagnosis not present

## 2019-09-27 DIAGNOSIS — D509 Iron deficiency anemia, unspecified: Secondary | ICD-10-CM | POA: Diagnosis not present

## 2019-09-27 DIAGNOSIS — N2581 Secondary hyperparathyroidism of renal origin: Secondary | ICD-10-CM | POA: Diagnosis not present

## 2019-09-27 DIAGNOSIS — D631 Anemia in chronic kidney disease: Secondary | ICD-10-CM | POA: Diagnosis not present

## 2019-09-29 ENCOUNTER — Other Ambulatory Visit (HOSPITAL_COMMUNITY): Payer: Self-pay | Admitting: *Deleted

## 2019-09-29 DIAGNOSIS — C7A019 Malignant carcinoid tumor of the small intestine, unspecified portion: Secondary | ICD-10-CM

## 2019-09-29 MED ORDER — EVEROLIMUS 5 MG PO TABS: 5.0000 mg | ORAL_TABLET | Freq: Every day | ORAL | 2 refills | Status: DC

## 2019-09-30 DIAGNOSIS — N186 End stage renal disease: Secondary | ICD-10-CM | POA: Diagnosis not present

## 2019-09-30 DIAGNOSIS — D509 Iron deficiency anemia, unspecified: Secondary | ICD-10-CM | POA: Diagnosis not present

## 2019-09-30 DIAGNOSIS — Z992 Dependence on renal dialysis: Secondary | ICD-10-CM | POA: Diagnosis not present

## 2019-09-30 DIAGNOSIS — N2581 Secondary hyperparathyroidism of renal origin: Secondary | ICD-10-CM | POA: Diagnosis not present

## 2019-09-30 DIAGNOSIS — D631 Anemia in chronic kidney disease: Secondary | ICD-10-CM | POA: Diagnosis not present

## 2019-10-01 ENCOUNTER — Inpatient Hospital Stay (HOSPITAL_BASED_OUTPATIENT_CLINIC_OR_DEPARTMENT_OTHER): Payer: Medicare Other | Admitting: Hematology

## 2019-10-01 ENCOUNTER — Inpatient Hospital Stay (HOSPITAL_COMMUNITY): Payer: Medicare Other | Attending: Hematology

## 2019-10-01 ENCOUNTER — Encounter (HOSPITAL_COMMUNITY): Payer: Self-pay | Admitting: Hematology

## 2019-10-01 ENCOUNTER — Other Ambulatory Visit: Payer: Self-pay

## 2019-10-01 ENCOUNTER — Inpatient Hospital Stay (HOSPITAL_COMMUNITY): Payer: Medicare Other

## 2019-10-01 VITALS — BP 108/58 | HR 65 | Temp 97.1°F | Resp 18 | Wt 196.3 lb

## 2019-10-01 DIAGNOSIS — D500 Iron deficiency anemia secondary to blood loss (chronic): Secondary | ICD-10-CM

## 2019-10-01 DIAGNOSIS — C7B02 Secondary carcinoid tumors of liver: Secondary | ICD-10-CM | POA: Diagnosis not present

## 2019-10-01 DIAGNOSIS — C179 Malignant neoplasm of small intestine, unspecified: Secondary | ICD-10-CM

## 2019-10-01 DIAGNOSIS — C7A00 Malignant carcinoid tumor of unspecified site: Secondary | ICD-10-CM | POA: Insufficient documentation

## 2019-10-01 LAB — CBC WITH DIFFERENTIAL/PLATELET
Abs Immature Granulocytes: 0 10*3/uL (ref 0.00–0.07)
Basophils Absolute: 0 10*3/uL (ref 0.0–0.1)
Basophils Relative: 1 %
Eosinophils Absolute: 0.1 10*3/uL (ref 0.0–0.5)
Eosinophils Relative: 3 %
HCT: 35.7 % — ABNORMAL LOW (ref 39.0–52.0)
Hemoglobin: 11.6 g/dL — ABNORMAL LOW (ref 13.0–17.0)
Immature Granulocytes: 0 %
Lymphocytes Relative: 11 %
Lymphs Abs: 0.5 10*3/uL — ABNORMAL LOW (ref 0.7–4.0)
MCH: 29.4 pg (ref 26.0–34.0)
MCHC: 32.5 g/dL (ref 30.0–36.0)
MCV: 90.6 fL (ref 80.0–100.0)
Monocytes Absolute: 0.5 10*3/uL (ref 0.1–1.0)
Monocytes Relative: 12 %
Neutro Abs: 3.3 10*3/uL (ref 1.7–7.7)
Neutrophils Relative %: 73 %
Platelets: 134 10*3/uL — ABNORMAL LOW (ref 150–400)
RBC: 3.94 MIL/uL — ABNORMAL LOW (ref 4.22–5.81)
RDW: 12.9 % (ref 11.5–15.5)
WBC: 4.4 10*3/uL (ref 4.0–10.5)
nRBC: 0 % (ref 0.0–0.2)

## 2019-10-01 LAB — COMPREHENSIVE METABOLIC PANEL
ALT: 13 U/L (ref 0–44)
AST: 32 U/L (ref 15–41)
Albumin: 3.4 g/dL — ABNORMAL LOW (ref 3.5–5.0)
Alkaline Phosphatase: 61 U/L (ref 38–126)
Anion gap: 14 (ref 5–15)
BUN: 38 mg/dL — ABNORMAL HIGH (ref 8–23)
CO2: 28 mmol/L (ref 22–32)
Calcium: 8.3 mg/dL — ABNORMAL LOW (ref 8.9–10.3)
Chloride: 94 mmol/L — ABNORMAL LOW (ref 98–111)
Creatinine, Ser: 5.87 mg/dL — ABNORMAL HIGH (ref 0.61–1.24)
GFR calc Af Amer: 10 mL/min — ABNORMAL LOW (ref >60)
GFR calc non Af Amer: 8 mL/min — ABNORMAL LOW (ref >60)
Glucose, Bld: 149 mg/dL — ABNORMAL HIGH (ref 70–99)
Potassium: 3.2 mmol/L — ABNORMAL LOW (ref 3.5–5.1)
Sodium: 136 mmol/L (ref 135–145)
Total Bilirubin: 0.5 mg/dL (ref 0.3–1.2)
Total Protein: 6.6 g/dL (ref 6.5–8.1)

## 2019-10-01 LAB — LACTATE DEHYDROGENASE: LDH: 188 U/L (ref 98–192)

## 2019-10-01 MED ORDER — OCTREOTIDE ACETATE 30 MG IM KIT
30.0000 mg | PACK | Freq: Once | INTRAMUSCULAR | Status: AC
  Administered 2019-10-01: 30 mg via INTRAMUSCULAR

## 2019-10-01 NOTE — Patient Instructions (Addendum)
[place] at [facility] Discharge Instructions  You were seen today by Dr. Delton Coombes. He went over your recent lab results. Continue taking Afinitor as directed. He will repeat your scan prior to your next visit. He will see you back in 4 weeks for labs, injections and follow up.   Thank you for choosing [place] at [facility] to provide your oncology and hematology care.  To afford each patient quality time with our provider, please arrive at least 15 minutes before your scheduled appointment time.   If you have a lab appointment with the [facility] please come in thru the  Main Entrance and check in at the main information desk  You need to re-schedule your appointment should you arrive 10 or more minutes late.  We strive to give you quality time with our providers, and arriving late affects you and other patients whose appointments are after yours.  Also, if you no show three or more times for appointments you may be dismissed from the clinic at the providers discretion.     Again, thank you for choosing [facility].  Our hope is that these requests will decrease the amount of time that you wait before being seen by our physicians.       _____________________________________________________________  Should you have questions after your visit to [facility], please contact our office at [phone] between the hours of 8:00 a.m. and 4:30 p.m.  Voicemails left after 4:00 p.m. will not be returned until the following business day.  For prescription refill requests, have your pharmacy contact our office and allow 72 hours.    Cancer Center Support Programs:   > Cancer Support Group  2nd Tuesday of the month 1pm-2pm, Journey Room

## 2019-10-01 NOTE — Progress Notes (Signed)
Patient tolerated sandostatin injection with no complaints voiced.  Site clean and dry with no bruising or swelling noted at site.  Band aid applied.  Vss with discharge and left ambulatory with no s/s of distress noted.

## 2019-10-01 NOTE — Progress Notes (Signed)
[place], [place] [address]   CLINIC:  Medical Oncology/Hematology  PCP:  [name], MD [place] [phone]   REASON FOR VISIT:  Follow-up for metastatic carcinoid tumor  CURRENT THERAPY: Everolimus 5 mg daily and Sandostatin monthly.     INTERVAL HISTORY:  Mr. Stetson 79 y.o. male seen for follow-up of metastatic carcinoid tumor.  Tolerating everolimus 5 mg daily very well.  Denies any GI side effects.  Appetite and energy levels are 100%.  Does not report any problems with dialysis.  No mucositis reported.  No fevers or infections.  No recent ER visits or hospitalizations.  Also tolerating octreotide very well monthly.  Denies any bleeding per rectum or melena.  Chronic leg swelling is stable.    REVIEW OF SYSTEMS:  Review of Systems  Cardiovascular: Positive for leg swelling.  All other systems reviewed and are negative.    PAST MEDICAL/SURGICAL HISTORY:  Past Medical History:  Diagnosis Date  . Anemia   . Cancer [facility])    right renal . Prostate- Radiation treatment  . CHF (congestive heart failure) ([place])   . Chronic kidney disease    Dialysis T/TH/Sa  . Edema   . Heart murmur    "nothing to worry about"  . Hyperlipidemia   . Hypertension   . Malignant carcinoid tumor ([place]) 01/03/2016  . Pneumonia    as a child   Past Surgical History:  Procedure Laterality Date  . AV FISTULA PLACEMENT Left 10/20/2014   Procedure: Left Arm ARTERIOVENOUS (AV) FISTULA CREATION;  Surgeon: Elam Dutch, MD;  Location: [facility];  Service: Vascular;  Laterality: Left;  . NEPHRECTOMY Right 2010  . PERIPHERAL VASCULAR BALLOON ANGIOPLASTY  06/20/2019   Procedure: PERIPHERAL VASCULAR BALLOON ANGIOPLASTY;  Surgeon: Elam Dutch, MD;  Location: [facility];  Service: Cardiovascular;;  . REVISON OF ARTERIOVENOUS FISTULA Left 08/04/2019   Procedure: REVISON OF ARTERIOVENOUS FISTULA WITH SIDE BRANCH  LIGATION;  Surgeon: Elam Dutch, MD;  Location: [facility] OR;  Service: Vascular;  Laterality: Left;     SOCIAL HISTORY:  Social History   Socioeconomic History  . Marital status: Married    Spouse name: Not on file  . Number of children: Not on file  . Years of education: Not on file  . Highest education level: Not on file  Occupational History  . Not on file  Tobacco Use  . Smoking status: Never Smoker  . Smokeless tobacco: Never Used  Substance and Sexual Activity  . Alcohol use: No    Alcohol/week: 0.0 standard drinks  . Drug use: No  . Sexual activity: Not on file  Other Topics Concern  . Not on file  Social History Narrative  . Not on file   Social Determinants of Health   Financial Resource Strain:   . Difficulty of Paying Living Expenses:   Food Insecurity:   . Worried About Charity fundraiser in the Last Year:   . Arboriculturist in the Last Year:   Transportation Needs:   . Film/video editor (Medical):   [name] Lack of Transportation (Non-Medical):   Physical Activity:   . Days of Exercise per Week:   . Minutes of Exercise per Session:   Stress:   . Feeling of Stress :   Social Connections:   . Frequency of Communication with Friends and Family:   . Frequency of Social Gatherings with  Friends and Family:   . Attends Religious Services:   . Active Member of Clubs or Organizations:   . Attends Archivist Meetings:   [name] Marital Status:   Intimate Partner Violence:   . Fear of Current or Ex-Partner:   . Emotionally Abused:   [name] Physically Abused:   . Sexually Abused:     FAMILY HISTORY:  Family History  Problem Relation Age of Onset  . Cancer Mother   . Hypertension Father   . Cancer Sister     CURRENT MEDICATIONS:  Outpatient Encounter Medications as of 10/01/2019  Medication Sig Note  . alendronate (FOSAMAX) 70 MG tablet Take 70 mg by mouth every Sunday.    [name] amLODipine (NORVASC) 10 MG tablet Take 10 mg by mouth daily.   [name]  atorvastatin (LIPITOR) 80 MG tablet Take 80 mg by mouth daily at 6 PM.    . calcitRIOL (ROCALTROL) 0.25 MCG capsule Take 0.25 mcg by mouth daily.    . carvedilol (COREG) 3.125 MG tablet Take 3.125 mg by mouth 2 (two) times daily with a meal.    . Cholecalciferol (VITAMIN D3) 250 MCG (10000 UT) TABS Take 10,000 Units by mouth daily.    [name] dexamethasone (DECADRON) 0.5 MG/5ML solution SWISH TEN ML BY MOUTH FOUR TIMES DAILY FOR TWO MINUTES THEN SPIT (Patient taking differently: Take 1 mg by mouth See admin instructions. SWISH 10 ml (1 mg) BY MOUTH FOUR TIMES DAILY FOR TWO MINUTES THEN SPIT)   . epoetin alfa (EPOGEN) 4000 UNIT/ML injection Inject into the skin every 30 (thirty) days.  07/31/2019: Pt doesn't know how much he gets, its done at hospital once a month  . everolimus (AFINITOR) 5 MG tablet Take 1 tablet (5 mg total) by mouth daily.   . furosemide (LASIX) 40 MG tablet Take 40 mg by mouth every morning.   . iron polysaccharides (NIFEREX) 150 MG capsule Take 150 mg by mouth daily.    [name] isoniazid (NYDRAZID) 300 MG tablet Take 1 tablet (300 mg total) by mouth daily.   . metolazone (ZAROXOLYN) 5 MG tablet Take 5 mg by mouth every other day.   [name] octreotide (SANDOSTATIN LAR DEPOT) 30 MG injection Inject 30 mg into the muscle every 28 (twenty-eight) days.    . Omega-3 Fatty Acids (FISH OIL) 1000 MG CAPS Take 1,000 mg by mouth daily.    [name] pyridOXINE (B-6) 50 MG tablet Take 1 tablet (50 mg total) by mouth daily.   . sodium bicarbonate 650 MG tablet Take 650 mg by mouth daily.   . tamsulosin (FLOMAX) 0.4 MG CAPS capsule Take 0.4 mg by mouth daily.     No facility-administered encounter medications on file as of 10/01/2019.    ALLERGIES:  No Known Allergies   PHYSICAL EXAM:  ECOG Performance status: 1  Vitals:   10/01/19 1100  BP: (!) 108/58  Pulse: 65  Resp: 18  Temp: (!) 97.1 F (36.2 C)  SpO2: 100%   Filed Weights   10/01/19 1100  Weight: 196 lb 5 oz (89 kg)    Physical Exam Vitals  reviewed.  Constitutional:      Appearance: Normal appearance.  Cardiovascular:     Rate and Rhythm: Normal rate and regular rhythm.     Heart sounds: Normal heart sounds.  Pulmonary:     Effort: Pulmonary effort is normal.     Breath sounds: Normal breath sounds.  Abdominal:     General: There is no distension.  Palpations: Abdomen is soft. There is no mass.  Musculoskeletal:        General: No swelling.  Skin:    General: Skin is warm.  Neurological:     General: No focal deficit present.     Mental Status: He is alert and oriented to person, place, and time.  Psychiatric:        Mood and Affect: Mood normal.        Behavior: Behavior normal.      LABORATORY DATA:  I have reviewed the labs as listed.  CBC    Component Value Date/Time   WBC 4.4 10/01/2019 1008   RBC 3.94 (L) 10/01/2019 1008   HGB 11.6 (L) 10/01/2019 1008   HCT 35.7 (L) 10/01/2019 1008   PLT 134 (L) 10/01/2019 1008   MCV 90.6 10/01/2019 1008   MCH 29.4 10/01/2019 1008   MCHC 32.5 10/01/2019 1008   RDW 12.9 10/01/2019 1008   LYMPHSABS 0.5 (L) 10/01/2019 1008   MONOABS 0.5 10/01/2019 1008   EOSABS 0.1 10/01/2019 1008   BASOSABS 0.0 10/01/2019 1008   CMP Latest Ref Rng & Units 10/01/2019 09/03/2019 08/20/2019  Glucose 70 - 99 mg/dL 149(H) - -  BUN 8 - 23 mg/dL 38(H) - -  Creatinine 0.61 - 1.24 mg/dL 5.87(H) - -  Sodium 135 - 145 mmol/L 136 - -  Potassium 3.5 - 5.1 mmol/L 3.2(L) - -  Chloride 98 - 111 mmol/L 94(L) - -  CO2 22 - 32 mmol/L 28 - -  Calcium 8.9 - 10.3 mg/dL 8.3(L) - -  Total Protein 6.5 - 8.1 g/dL 6.6 6.8 6.4(L)  Total Bilirubin 0.3 - 1.2 mg/dL 0.5 0.6 0.5  Alkaline Phos 38 - 126 U/L 61 66 65  AST 15 - 41 U/L 32 37 35  ALT 0 - 44 U/L 13 17 15        DIAGNOSTIC IMAGING:  I have independently reviewed scans.   I have reviewed Venita Lick LPN's note and agree with the documentation.  I personally performed a face-to-face visit, made revisions and my assessment and plan is as  follows.    ASSESSMENT & PLAN:   Malignant carcinoid tumor ([place]) 1.  Metastatic carcinoid tumor to the liver and peritoneum: -He has been on Sandostatin over 5 years.  Everolimus 5 mg daily started on 05/17/2018. -Gallium-68 dotatate scan on 04/08/2019 revealed no evidence of disease progression.  Stable exam compared to May 2020 scan. -He is tolerating everolimus 5 mg daily very well. -I have reviewed his labs.  Platelet count is mildly low at 134.  Hemoglobin is 11.6 and white count is 4.4 with 73% neutrophils. -His LFTs are normal.  I will order a chromogranin level. -He will continue monthly octreotide injections.  I will order a gallium-68 dotatate scan prior to next visit in a month.  2.  Normocytic anemia: -His hemoglobin today is 11.6.  Last ferritin on 08/06/2019 was 454 with percent saturation of 50. -He receives Epogen with dialysis.  I plan to repeat ferritin and iron panel soon.  3.  ESRD on HD: -Started on HD on 05/20/2019, Tuesday Thursday and Saturday.   Orders placed this encounter:  Orders Placed This Encounter  Procedures  . NM PET (NETSPOT GA 51 DOTATATE) SKULL BASE TO MID THIGH  . CBC with Differential/Platelet  . Comprehensive metabolic panel  . Lactate dehydrogenase      Derek Jack, MD [place] [phone]

## 2019-10-02 ENCOUNTER — Ambulatory Visit (HOSPITAL_COMMUNITY): Payer: Medicare Other

## 2019-10-02 ENCOUNTER — Ambulatory Visit (HOSPITAL_COMMUNITY): Payer: Medicare Other | Admitting: Hematology

## 2019-10-02 ENCOUNTER — Telehealth (HOSPITAL_COMMUNITY): Payer: Self-pay | Admitting: Pharmacy Technician

## 2019-10-02 ENCOUNTER — Other Ambulatory Visit (HOSPITAL_COMMUNITY): Payer: Medicare Other

## 2019-10-02 DIAGNOSIS — Z992 Dependence on renal dialysis: Secondary | ICD-10-CM | POA: Diagnosis not present

## 2019-10-02 DIAGNOSIS — D631 Anemia in chronic kidney disease: Secondary | ICD-10-CM | POA: Diagnosis not present

## 2019-10-02 DIAGNOSIS — N2581 Secondary hyperparathyroidism of renal origin: Secondary | ICD-10-CM | POA: Diagnosis not present

## 2019-10-02 DIAGNOSIS — D509 Iron deficiency anemia, unspecified: Secondary | ICD-10-CM | POA: Diagnosis not present

## 2019-10-02 DIAGNOSIS — N186 End stage renal disease: Secondary | ICD-10-CM | POA: Diagnosis not present

## 2019-10-02 NOTE — Telephone Encounter (Signed)
Oral Oncology Patient Advocate Encounter  Spoke with patient on 3/17 about renewing application for [employer] Patient [employer].  Once I have completed his application he will stop by the office to sign.    [place] Patient [place] Phone [phone] Fax [phone] 10/02/2019 4:15 PM

## 2019-10-04 ENCOUNTER — Encounter (HOSPITAL_COMMUNITY): Payer: Self-pay | Admitting: Hematology

## 2019-10-04 DIAGNOSIS — N2581 Secondary hyperparathyroidism of renal origin: Secondary | ICD-10-CM | POA: Diagnosis not present

## 2019-10-04 DIAGNOSIS — D509 Iron deficiency anemia, unspecified: Secondary | ICD-10-CM | POA: Diagnosis not present

## 2019-10-04 DIAGNOSIS — D631 Anemia in chronic kidney disease: Secondary | ICD-10-CM | POA: Diagnosis not present

## 2019-10-04 DIAGNOSIS — Z992 Dependence on renal dialysis: Secondary | ICD-10-CM | POA: Diagnosis not present

## 2019-10-04 DIAGNOSIS — N186 End stage renal disease: Secondary | ICD-10-CM | POA: Diagnosis not present

## 2019-10-04 NOTE — Assessment & Plan Note (Addendum)
1.  Metastatic carcinoid tumor to the liver and peritoneum: -He has been on Sandostatin over 5 years.  Everolimus 5 mg daily started on 05/17/2018. -Gallium-68 dotatate scan on 04/08/2019 revealed no evidence of disease progression.  Stable exam compared to May 2020 scan. -He is tolerating everolimus 5 mg daily very well. -I have reviewed his labs.  Platelet count is mildly low at 134.  Hemoglobin is 11.6 and white count is 4.4 with 73% neutrophils. -His LFTs are normal.  I will order a chromogranin level. -He will continue monthly octreotide injections.  I will order a gallium-68 dotatate scan prior to next visit in a month.  2.  Normocytic anemia: -His hemoglobin today is 11.6.  Last ferritin on 08/06/2019 was 454 with percent saturation of 50. -He receives Epogen with dialysis.  I plan to repeat ferritin and iron panel soon.  3.  ESRD on HD: -Started on HD on 05/20/2019, Tuesday Thursday and Saturday.

## 2019-10-06 NOTE — Telephone Encounter (Signed)
Oral Oncology Patient Advocate Encounter  Patient stopped by [facility] to complete renewal application for [employer]  in an effort to reduce patient's out of pocket expense for Afinitor to $0.    Application completed and faxed to [phone].   [employer] Patient [employer] phone number for follow up is [phone].   This encounter will be updated until final determination.   [place] Patient [place] Phone [phone] Fax [phone] 10/06/2019 8:39 AM

## 2019-10-07 DIAGNOSIS — D509 Iron deficiency anemia, unspecified: Secondary | ICD-10-CM | POA: Diagnosis not present

## 2019-10-07 DIAGNOSIS — Z992 Dependence on renal dialysis: Secondary | ICD-10-CM | POA: Diagnosis not present

## 2019-10-07 DIAGNOSIS — N186 End stage renal disease: Secondary | ICD-10-CM | POA: Diagnosis not present

## 2019-10-07 DIAGNOSIS — D631 Anemia in chronic kidney disease: Secondary | ICD-10-CM | POA: Diagnosis not present

## 2019-10-07 DIAGNOSIS — N2581 Secondary hyperparathyroidism of renal origin: Secondary | ICD-10-CM | POA: Diagnosis not present

## 2019-10-08 DIAGNOSIS — E1151 Type 2 diabetes mellitus with diabetic peripheral angiopathy without gangrene: Secondary | ICD-10-CM | POA: Diagnosis not present

## 2019-10-08 DIAGNOSIS — E1140 Type 2 diabetes mellitus with diabetic neuropathy, unspecified: Secondary | ICD-10-CM | POA: Diagnosis not present

## 2019-10-09 DIAGNOSIS — N2581 Secondary hyperparathyroidism of renal origin: Secondary | ICD-10-CM | POA: Diagnosis not present

## 2019-10-09 DIAGNOSIS — D631 Anemia in chronic kidney disease: Secondary | ICD-10-CM | POA: Diagnosis not present

## 2019-10-09 DIAGNOSIS — Z992 Dependence on renal dialysis: Secondary | ICD-10-CM | POA: Diagnosis not present

## 2019-10-09 DIAGNOSIS — D509 Iron deficiency anemia, unspecified: Secondary | ICD-10-CM | POA: Diagnosis not present

## 2019-10-09 DIAGNOSIS — N186 End stage renal disease: Secondary | ICD-10-CM | POA: Diagnosis not present

## 2019-10-11 DIAGNOSIS — D631 Anemia in chronic kidney disease: Secondary | ICD-10-CM | POA: Diagnosis not present

## 2019-10-11 DIAGNOSIS — N2581 Secondary hyperparathyroidism of renal origin: Secondary | ICD-10-CM | POA: Diagnosis not present

## 2019-10-11 DIAGNOSIS — N186 End stage renal disease: Secondary | ICD-10-CM | POA: Diagnosis not present

## 2019-10-11 DIAGNOSIS — D509 Iron deficiency anemia, unspecified: Secondary | ICD-10-CM | POA: Diagnosis not present

## 2019-10-11 DIAGNOSIS — Z992 Dependence on renal dialysis: Secondary | ICD-10-CM | POA: Diagnosis not present

## 2019-10-14 DIAGNOSIS — Z992 Dependence on renal dialysis: Secondary | ICD-10-CM | POA: Diagnosis not present

## 2019-10-14 DIAGNOSIS — D631 Anemia in chronic kidney disease: Secondary | ICD-10-CM | POA: Diagnosis not present

## 2019-10-14 DIAGNOSIS — N2581 Secondary hyperparathyroidism of renal origin: Secondary | ICD-10-CM | POA: Diagnosis not present

## 2019-10-14 DIAGNOSIS — N186 End stage renal disease: Secondary | ICD-10-CM | POA: Diagnosis not present

## 2019-10-14 DIAGNOSIS — D509 Iron deficiency anemia, unspecified: Secondary | ICD-10-CM | POA: Diagnosis not present

## 2019-10-15 DIAGNOSIS — Z992 Dependence on renal dialysis: Secondary | ICD-10-CM | POA: Diagnosis not present

## 2019-10-15 DIAGNOSIS — N186 End stage renal disease: Secondary | ICD-10-CM | POA: Diagnosis not present

## 2019-10-18 DIAGNOSIS — N186 End stage renal disease: Secondary | ICD-10-CM | POA: Diagnosis not present

## 2019-10-18 DIAGNOSIS — Z1159 Encounter for screening for other viral diseases: Secondary | ICD-10-CM | POA: Diagnosis not present

## 2019-10-18 DIAGNOSIS — D509 Iron deficiency anemia, unspecified: Secondary | ICD-10-CM | POA: Diagnosis not present

## 2019-10-18 DIAGNOSIS — D631 Anemia in chronic kidney disease: Secondary | ICD-10-CM | POA: Diagnosis not present

## 2019-10-18 DIAGNOSIS — Z992 Dependence on renal dialysis: Secondary | ICD-10-CM | POA: Diagnosis not present

## 2019-10-20 NOTE — Telephone Encounter (Addendum)
Oral Oncology Patient Advocate Encounter  Received notification from [place] that patient has been successfully enrolled into their program to receive Afinitor from the manufacturer at $0 out of pocket until 07/16/2020.    I called and spoke with the patients wife, Stanton Kidney.  They know we will have to re-apply.   Specialty Pharmacy that will dispense medication is [facility] by [employer].  Patient knows to call the office with questions or concerns.   Oral Oncology Clinic will continue to follow.  [place] Patient [place] Phone [phone] Fax [phone] 10/20/2019 8:37 AM

## 2019-10-21 DIAGNOSIS — Z992 Dependence on renal dialysis: Secondary | ICD-10-CM | POA: Diagnosis not present

## 2019-10-21 DIAGNOSIS — D631 Anemia in chronic kidney disease: Secondary | ICD-10-CM | POA: Diagnosis not present

## 2019-10-21 DIAGNOSIS — N186 End stage renal disease: Secondary | ICD-10-CM | POA: Diagnosis not present

## 2019-10-21 DIAGNOSIS — Z1159 Encounter for screening for other viral diseases: Secondary | ICD-10-CM | POA: Diagnosis not present

## 2019-10-21 DIAGNOSIS — D509 Iron deficiency anemia, unspecified: Secondary | ICD-10-CM | POA: Diagnosis not present

## 2019-10-23 DIAGNOSIS — Z1159 Encounter for screening for other viral diseases: Secondary | ICD-10-CM | POA: Diagnosis not present

## 2019-10-23 DIAGNOSIS — N186 End stage renal disease: Secondary | ICD-10-CM | POA: Diagnosis not present

## 2019-10-23 DIAGNOSIS — D631 Anemia in chronic kidney disease: Secondary | ICD-10-CM | POA: Diagnosis not present

## 2019-10-23 DIAGNOSIS — Z992 Dependence on renal dialysis: Secondary | ICD-10-CM | POA: Diagnosis not present

## 2019-10-23 DIAGNOSIS — D509 Iron deficiency anemia, unspecified: Secondary | ICD-10-CM | POA: Diagnosis not present

## 2019-10-25 DIAGNOSIS — N186 End stage renal disease: Secondary | ICD-10-CM | POA: Diagnosis not present

## 2019-10-25 DIAGNOSIS — D631 Anemia in chronic kidney disease: Secondary | ICD-10-CM | POA: Diagnosis not present

## 2019-10-25 DIAGNOSIS — D509 Iron deficiency anemia, unspecified: Secondary | ICD-10-CM | POA: Diagnosis not present

## 2019-10-25 DIAGNOSIS — Z992 Dependence on renal dialysis: Secondary | ICD-10-CM | POA: Diagnosis not present

## 2019-10-25 DIAGNOSIS — Z1159 Encounter for screening for other viral diseases: Secondary | ICD-10-CM | POA: Diagnosis not present

## 2019-10-28 DIAGNOSIS — N186 End stage renal disease: Secondary | ICD-10-CM | POA: Diagnosis not present

## 2019-10-28 DIAGNOSIS — D631 Anemia in chronic kidney disease: Secondary | ICD-10-CM | POA: Diagnosis not present

## 2019-10-28 DIAGNOSIS — Z992 Dependence on renal dialysis: Secondary | ICD-10-CM | POA: Diagnosis not present

## 2019-10-28 DIAGNOSIS — Z1159 Encounter for screening for other viral diseases: Secondary | ICD-10-CM | POA: Diagnosis not present

## 2019-10-28 DIAGNOSIS — D509 Iron deficiency anemia, unspecified: Secondary | ICD-10-CM | POA: Diagnosis not present

## 2019-10-29 ENCOUNTER — Ambulatory Visit (HOSPITAL_COMMUNITY)
Admission: RE | Admit: 2019-10-29 | Discharge: 2019-10-29 | Disposition: A | Discharge status: Home / Self Care | Payer: Medicare Other | Source: Ambulatory Visit | Attending: Hematology | Admitting: Hematology

## 2019-10-29 ENCOUNTER — Other Ambulatory Visit: Payer: Self-pay

## 2019-10-29 DIAGNOSIS — C787 Secondary malignant neoplasm of liver and intrahepatic bile duct: Secondary | ICD-10-CM | POA: Insufficient documentation

## 2019-10-29 DIAGNOSIS — C7A00 Malignant carcinoid tumor of unspecified site: Secondary | ICD-10-CM

## 2019-10-29 DIAGNOSIS — C786 Secondary malignant neoplasm of retroperitoneum and peritoneum: Secondary | ICD-10-CM | POA: Diagnosis not present

## 2019-10-29 DIAGNOSIS — C801 Malignant (primary) neoplasm, unspecified: Secondary | ICD-10-CM | POA: Diagnosis not present

## 2019-10-29 DIAGNOSIS — C7A1 Malignant poorly differentiated neuroendocrine tumors: Secondary | ICD-10-CM | POA: Diagnosis not present

## 2019-10-29 MED ORDER — GALLIUM GA 68 DOTATATE IV KIT
4.9000 | PACK | Freq: Once | INTRAVENOUS | Status: AC | PRN
  Administered 2019-10-29: 4.9 via INTRAVENOUS

## 2019-10-30 DIAGNOSIS — N186 End stage renal disease: Secondary | ICD-10-CM | POA: Diagnosis not present

## 2019-10-30 DIAGNOSIS — Z992 Dependence on renal dialysis: Secondary | ICD-10-CM | POA: Diagnosis not present

## 2019-10-30 DIAGNOSIS — Z1159 Encounter for screening for other viral diseases: Secondary | ICD-10-CM | POA: Diagnosis not present

## 2019-10-30 DIAGNOSIS — D509 Iron deficiency anemia, unspecified: Secondary | ICD-10-CM | POA: Diagnosis not present

## 2019-10-30 DIAGNOSIS — D631 Anemia in chronic kidney disease: Secondary | ICD-10-CM | POA: Diagnosis not present

## 2019-10-30 IMAGING — PT NM PET NOPR SKULL BASE TO THIGH
1 of 9 series · 1 of 25 positions shown · non-contrast
Comparison: PET scan 11/27/2018

CLINICAL DATA: Well differentiated neuroendocrine tumor.

EXAM:
NUCLEAR MEDICINE PET SKULL BASE TO THIGH
TECHNIQUE: 5.1 mCi Ga 68 DOTATATE was injected intravenously. Full-ring PET
imaging was performed from the skull base to thigh after the
radiotracer. CT data was obtained and used for attenuation
correction and anatomic localization.

[Series 4: ct sk_thigh 5.0 b31f · axial · 5.0mm · 0.98mm/px · 1 of 234 slices shown]
[im 234/234  brain]
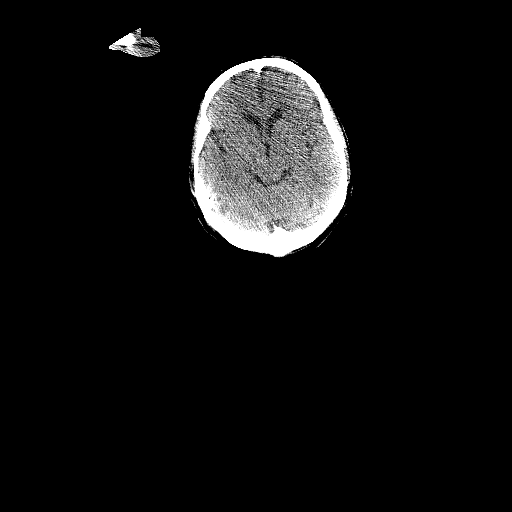

[1 of 25 positions shown; findings below may reference images not displayed]

FINDINGS: NECK

No radiotracer activity in neck lymph nodes. Uptake in the RIGHT
cervical region (C2-C3) is favored degenerative.

Incidental CT findings: None

CHEST

No radiotracer accumulation within mediastinal or hilar lymph nodes.

Within the RIGHT lower lobe, increased nodular density posteriorly
measuring 16 mm (image 45/8. This increased nodularity does not have
associated radiotracer activity.

Activity previously potentially localizing to the heart is favored
to be in the superior peritoneal surface below the LEFT
hemidiaphragm.

Incidental CT finding:None

ABDOMEN/PELVIS

Multiple intensely radiotracer avid hepatic lesions again noted. No
new lesions present. Example lesion the posterior RIGHT hepatic lobe
with SUV max equal 26.3 compared to 35.1. Lesion appears similar in
size. More central RIGHT hepatic lobe lesion with SUV max equal
compares with SUV max equal 25.1.

Within the RIGHT upper quadrant mesentery bilobed nodule lesion with
central calcified measures 3.9 cm and has intense radiotracer
activity SUV max equal 27.8 similar 29.8 on prior. No new mesenteric
lesions.

Lesion along the umbilicus with soft tissue thickening and intense
radiotracer activity again noted and unchanged. A peritoneal nodule
in the LEFT lower quadrant with SUV max equal 23.6 unchanged.

No new or progressive disease.

Soft tissue nodule superior to the bladder and seminal vesicles
measuring 2.7 cm with SUV max equal 39.5 compared to 2.6 cm and SUV
max equal 34.7.

Physiologic activity noted in the spleen, adrenal glands and
kidneys.

Incidental CT findings:None

SKELETON

No focal activity to suggest skeletal metastasis.

Incidental CT findings:None
IMPRESSION: 1. Multifocal intensely radiotracer avid well differentiated
metastatic neuroendocrine tumor. Overall stable exam compared to
prior.
2. Metastatic sites include liver and peritoneal nodules and masses.
3. New nodularity at the RIGHT lung base without radiotracer
activity. Recommend attention on follow-up.
4. No evidence of disease progression.

## 2019-11-01 DIAGNOSIS — D631 Anemia in chronic kidney disease: Secondary | ICD-10-CM | POA: Diagnosis not present

## 2019-11-01 DIAGNOSIS — Z1159 Encounter for screening for other viral diseases: Secondary | ICD-10-CM | POA: Diagnosis not present

## 2019-11-01 DIAGNOSIS — N186 End stage renal disease: Secondary | ICD-10-CM | POA: Diagnosis not present

## 2019-11-01 DIAGNOSIS — D509 Iron deficiency anemia, unspecified: Secondary | ICD-10-CM | POA: Diagnosis not present

## 2019-11-01 DIAGNOSIS — Z992 Dependence on renal dialysis: Secondary | ICD-10-CM | POA: Diagnosis not present

## 2019-11-04 DIAGNOSIS — N186 End stage renal disease: Secondary | ICD-10-CM | POA: Diagnosis not present

## 2019-11-04 DIAGNOSIS — D631 Anemia in chronic kidney disease: Secondary | ICD-10-CM | POA: Diagnosis not present

## 2019-11-04 DIAGNOSIS — Z1159 Encounter for screening for other viral diseases: Secondary | ICD-10-CM | POA: Diagnosis not present

## 2019-11-04 DIAGNOSIS — Z992 Dependence on renal dialysis: Secondary | ICD-10-CM | POA: Diagnosis not present

## 2019-11-04 DIAGNOSIS — D509 Iron deficiency anemia, unspecified: Secondary | ICD-10-CM | POA: Diagnosis not present

## 2019-11-05 ENCOUNTER — Inpatient Hospital Stay (HOSPITAL_COMMUNITY): Payer: Medicare Other

## 2019-11-05 ENCOUNTER — Encounter (HOSPITAL_COMMUNITY): Payer: Self-pay | Admitting: Hematology

## 2019-11-05 ENCOUNTER — Inpatient Hospital Stay (HOSPITAL_BASED_OUTPATIENT_CLINIC_OR_DEPARTMENT_OTHER): Payer: Medicare Other | Admitting: Hematology

## 2019-11-05 ENCOUNTER — Inpatient Hospital Stay (HOSPITAL_COMMUNITY): Payer: Medicare Other | Attending: Hematology

## 2019-11-05 ENCOUNTER — Other Ambulatory Visit: Payer: Self-pay

## 2019-11-05 DIAGNOSIS — D500 Iron deficiency anemia secondary to blood loss (chronic): Secondary | ICD-10-CM

## 2019-11-05 DIAGNOSIS — C179 Malignant neoplasm of small intestine, unspecified: Secondary | ICD-10-CM

## 2019-11-05 DIAGNOSIS — C7A00 Malignant carcinoid tumor of unspecified site: Secondary | ICD-10-CM

## 2019-11-05 DIAGNOSIS — C7B02 Secondary carcinoid tumors of liver: Secondary | ICD-10-CM | POA: Diagnosis not present

## 2019-11-05 LAB — COMPREHENSIVE METABOLIC PANEL
ALT: 12 U/L (ref 0–44)
AST: 33 U/L (ref 15–41)
Albumin: 3.4 g/dL — ABNORMAL LOW (ref 3.5–5.0)
Alkaline Phosphatase: 58 U/L (ref 38–126)
Anion gap: 12 (ref 5–15)
BUN: 37 mg/dL — ABNORMAL HIGH (ref 8–23)
CO2: 30 mmol/L (ref 22–32)
Calcium: 8.4 mg/dL — ABNORMAL LOW (ref 8.9–10.3)
Chloride: 96 mmol/L — ABNORMAL LOW (ref 98–111)
Creatinine, Ser: 6.22 mg/dL — ABNORMAL HIGH (ref 0.61–1.24)
GFR calc Af Amer: 9 mL/min — ABNORMAL LOW (ref >60)
GFR calc non Af Amer: 8 mL/min — ABNORMAL LOW (ref >60)
Glucose, Bld: 161 mg/dL — ABNORMAL HIGH (ref 70–99)
Potassium: 4.1 mmol/L (ref 3.5–5.1)
Sodium: 138 mmol/L (ref 135–145)
Total Bilirubin: 0.8 mg/dL (ref 0.3–1.2)
Total Protein: 6.2 g/dL — ABNORMAL LOW (ref 6.5–8.1)

## 2019-11-05 LAB — CBC WITH DIFFERENTIAL/PLATELET
Abs Immature Granulocytes: 0.01 10*3/uL (ref 0.00–0.07)
Basophils Absolute: 0 10*3/uL (ref 0.0–0.1)
Basophils Relative: 1 %
Eosinophils Absolute: 0.1 10*3/uL (ref 0.0–0.5)
Eosinophils Relative: 3 %
HCT: 33.7 % — ABNORMAL LOW (ref 39.0–52.0)
Hemoglobin: 10.7 g/dL — ABNORMAL LOW (ref 13.0–17.0)
Immature Granulocytes: 0 %
Lymphocytes Relative: 11 %
Lymphs Abs: 0.5 10*3/uL — ABNORMAL LOW (ref 0.7–4.0)
MCH: 29.2 pg (ref 26.0–34.0)
MCHC: 31.8 g/dL (ref 30.0–36.0)
MCV: 91.8 fL (ref 80.0–100.0)
Monocytes Absolute: 0.4 10*3/uL (ref 0.1–1.0)
Monocytes Relative: 10 %
Neutro Abs: 3.2 10*3/uL (ref 1.7–7.7)
Neutrophils Relative %: 75 %
Platelets: 138 10*3/uL — ABNORMAL LOW (ref 150–400)
RBC: 3.67 MIL/uL — ABNORMAL LOW (ref 4.22–5.81)
RDW: 13.3 % (ref 11.5–15.5)
WBC: 4.2 10*3/uL (ref 4.0–10.5)
nRBC: 0 % (ref 0.0–0.2)

## 2019-11-05 LAB — LACTATE DEHYDROGENASE: LDH: 170 U/L (ref 98–192)

## 2019-11-05 MED ORDER — OCTREOTIDE ACETATE 30 MG IM KIT
30.0000 mg | PACK | Freq: Once | INTRAMUSCULAR | Status: AC
  Administered 2019-11-05: 11:00:00 30 mg via INTRAMUSCULAR

## 2019-11-05 NOTE — Patient Instructions (Addendum)
[facility] at [facility] Discharge Instructions  You were seen today by Dr. Katragadda. He went over your recent lab results. He will see you back in 4 weeks for labs and follow up.   Thank you for choosing [facility] at [facility] to provide your oncology and hematology care.  To afford each patient quality time with our provider, please arrive at least 15 minutes before your scheduled appointment time.   If you have a lab appointment with the Cancer Center please come in thru the  Main Entrance and check in at the main information desk  You need to re-schedule your appointment should you arrive 10 or more minutes late.  We strive to give you quality time with our providers, and arriving late affects you and other patients whose appointments are after yours.  Also, if you no show three or more times for appointments you may be dismissed from the clinic at the providers discretion.     Again, thank you for choosing [facility].  Our hope is that these requests will decrease the amount of time that you wait before being seen by our physicians.       _____________________________________________________________  Should you have questions after your visit to [facility], please contact our office at [phone] between the hours of 8:00 a.m. and 4:30 p.m.  Voicemails left after 4:00 p.m. will not be returned until the following business day.  For prescription refill requests, have your pharmacy contact our office and allow 72 hours.    Cancer Center Support Programs:   > Cancer Support Group  2nd Tuesday of the month 1pm-2pm, Journey Room    

## 2019-11-05 NOTE — Progress Notes (Signed)
[address] [place], [place] [address]   CLINIC:  Medical Oncology/Hematology  PCP:  [name], MD [place] [phone]   REASON FOR VISIT:  Follow-up for metastatic carcinoid tumor  CURRENT THERAPY: Everolimus 5 mg daily and Sandostatin monthly.     INTERVAL HISTORY:  Allen Davenport 79 y.o. male seen for follow-up of metastatic carcinoid tumor.  He is tolerating everolimus very well.  He is also tolerating dialysis reasonably well.  Appetite and energy levels are 75%.  No abdominal pain reported.  Has chronic diarrhea which is stable.  Improvement in leg swelling noted.   REVIEW OF SYSTEMS:  Review of Systems  Cardiovascular: Positive for leg swelling.  Gastrointestinal: Positive for diarrhea.  All other systems reviewed and are negative.    PAST MEDICAL/SURGICAL HISTORY:  Past Medical History:  Diagnosis Date  . Anemia   . Cancer [facility])    right renal . Prostate- Radiation treatment  . CHF (congestive heart failure) ([place])   . Chronic kidney disease    Dialysis T/TH/Sa  . Edema   . Heart murmur    "nothing to worry about"  . Hyperlipidemia   . Hypertension   . Malignant carcinoid tumor ([place]) 01/03/2016  . Pneumonia    as a child   Past Surgical History:  Procedure Laterality Date  . AV FISTULA PLACEMENT Left 10/20/2014   Procedure: Left Arm ARTERIOVENOUS (AV) FISTULA CREATION;  Surgeon: Elam Dutch, MD;  Location: [place];  Service: Vascular;  Laterality: Left;  . NEPHRECTOMY Right 2010  . PERIPHERAL VASCULAR BALLOON ANGIOPLASTY  06/20/2019   Procedure: PERIPHERAL VASCULAR BALLOON ANGIOPLASTY;  Surgeon: Elam Dutch, MD;  Location: [facility];  Service: Cardiovascular;;  . REVISON OF ARTERIOVENOUS FISTULA Left 08/04/2019   Procedure: REVISON OF ARTERIOVENOUS FISTULA WITH SIDE BRANCH LIGATION;  Surgeon: Elam Dutch, MD;  Location: [facility] OR;  Service: Vascular;  Laterality: Left;      SOCIAL HISTORY:  Social History   Socioeconomic History  . Marital status: Married    Spouse name: Not on file  . Number of children: Not on file  . Years of education: Not on file  . Highest education level: Not on file  Occupational History  . Not on file  Tobacco Use  . Smoking status: Never Smoker  . Smokeless tobacco: Never Used  Substance and Sexual Activity  . Alcohol use: No    Alcohol/week: 0.0 standard drinks  . Drug use: No  . Sexual activity: Not on file  Other Topics Concern  . Not on file  Social History Narrative  . Not on file   Social Determinants of Health   Financial Resource Strain:   . Difficulty of Paying Living Expenses:   Food Insecurity:   . Worried About Charity fundraiser in the Last Year:   . Arboriculturist in the Last Year:   Transportation Needs:   . Film/video editor (Medical):   [name] Lack of Transportation (Non-Medical):   Physical Activity:   . Days of Exercise per Week:   . Minutes of Exercise per Session:   Stress:   . Feeling of Stress :   Social Connections:   . Frequency of Communication with Friends and Family:   . Frequency of Social Gatherings with Friends and Family:   . Attends Religious Services:   . Active Member of Clubs or Organizations:   . Attends  Club or Organization Meetings:   [name] Marital Status:   Intimate Partner Violence:   . Fear of Current or Ex-Partner:   . Emotionally Abused:   [name] Physically Abused:   . Sexually Abused:     FAMILY HISTORY:  Family History  Problem Relation Age of Onset  . Cancer Mother   . Hypertension Father   . Cancer Sister     CURRENT MEDICATIONS:  Outpatient Encounter Medications as of 11/05/2019  Medication Sig Note  . alendronate (FOSAMAX) 70 MG tablet Take 70 mg by mouth every Sunday.    [name] amLODipine (NORVASC) 10 MG tablet Take 10 mg by mouth daily.   [name] atorvastatin (LIPITOR) 80 MG tablet Take 80 mg by mouth daily at 6 PM.    . calcitRIOL (ROCALTROL) 0.25 MCG  capsule Take 0.25 mcg by mouth daily.    . carvedilol (COREG) 3.125 MG tablet Take 3.125 mg by mouth 2 (two) times daily with a meal.    . dexamethasone (DECADRON) 0.5 MG/5ML solution SWISH TEN ML BY MOUTH FOUR TIMES DAILY FOR TWO MINUTES THEN SPIT (Patient taking differently: Take 1 mg by mouth See admin instructions. SWISH 10 ml (1 mg) BY MOUTH FOUR TIMES DAILY FOR TWO MINUTES THEN SPIT)   . epoetin alfa (EPOGEN) 4000 UNIT/ML injection Inject into the skin every 30 (thirty) days.  07/31/2019: Pt doesn't know how much he gets, its done at hospital once a month  . everolimus (AFINITOR) 5 MG tablet Take 1 tablet (5 mg total) by mouth daily.   . furosemide (LASIX) 40 MG tablet Take 40 mg by mouth every morning.   . iron polysaccharides (NIFEREX) 150 MG capsule Take 150 mg by mouth daily.    [name] isoniazid (NYDRAZID) 300 MG tablet Take 1 tablet (300 mg total) by mouth daily.   . metolazone (ZAROXOLYN) 5 MG tablet Take 5 mg by mouth every other day.   [name] octreotide (SANDOSTATIN LAR DEPOT) 30 MG injection Inject 30 mg into the muscle every 28 (twenty-eight) days.    . Omega-3 Fatty Acids (FISH OIL) 1000 MG CAPS Take 1,000 mg by mouth daily.    [name] pyridOXINE (B-6) 50 MG tablet Take 1 tablet (50 mg total) by mouth daily.   . sodium bicarbonate 650 MG tablet Take 650 mg by mouth daily.   . tamsulosin (FLOMAX) 0.4 MG CAPS capsule Take 0.4 mg by mouth daily.    . [DISCONTINUED] Cholecalciferol (VITAMIN D3) 250 MCG (10000 UT) TABS Take 10,000 Units by mouth daily.     No facility-administered encounter medications on file as of 11/05/2019.    ALLERGIES:  No Known Allergies   PHYSICAL EXAM:  ECOG Performance status: 1  Vitals:   11/05/19 1018  BP: (!) 97/54  Pulse: 76  Resp: 18  Temp: (!) 96.9 F (36.1 C)  SpO2: 100%   Filed Weights   11/05/19 1018  Weight: 197 lb 9.6 oz (89.6 kg)    Physical Exam Vitals reviewed.  Constitutional:      Appearance: Normal appearance.  Cardiovascular:      Rate and Rhythm: Normal rate and regular rhythm.     Heart sounds: Normal heart sounds.  Pulmonary:     Effort: Pulmonary effort is normal.     Breath sounds: Normal breath sounds.  Abdominal:     General: There is no distension.     Palpations: Abdomen is soft. There is no mass.  Musculoskeletal:        General: No swelling.  Skin:    General: Skin is warm.  Neurological:     General: No focal deficit present.     Mental Status: He is alert and oriented to person, place, and time.  Psychiatric:        Mood and Affect: Mood normal.        Behavior: Behavior normal.      LABORATORY DATA:  I have reviewed the labs as listed.  CBC    Component Value Date/Time   WBC 4.2 11/05/2019 0935   RBC 3.67 (L) 11/05/2019 0935   HGB 10.7 (L) 11/05/2019 0935   HCT 33.7 (L) 11/05/2019 0935   PLT 138 (L) 11/05/2019 0935   MCV 91.8 11/05/2019 0935   MCH 29.2 11/05/2019 0935   MCHC 31.8 11/05/2019 0935   RDW 13.3 11/05/2019 0935   LYMPHSABS 0.5 (L) 11/05/2019 0935   MONOABS 0.4 11/05/2019 0935   EOSABS 0.1 11/05/2019 0935   BASOSABS 0.0 11/05/2019 0935   CMP Latest Ref Rng & Units 11/05/2019 10/01/2019 09/03/2019  Glucose 70 - 99 mg/dL 161(H) 149(H) -  BUN 8 - 23 mg/dL 37(H) 38(H) -  Creatinine 0.61 - 1.24 mg/dL 6.22(H) 5.87(H) -  Sodium 135 - 145 mmol/L 138 136 -  Potassium 3.5 - 5.1 mmol/L 4.1 3.2(L) -  Chloride 98 - 111 mmol/L 96(L) 94(L) -  CO2 22 - 32 mmol/L 30 28 -  Calcium 8.9 - 10.3 mg/dL 8.4(L) 8.3(L) -  Total Protein 6.5 - 8.1 g/dL 6.2(L) 6.6 6.8  Total Bilirubin 0.3 - 1.2 mg/dL 0.8 0.5 0.6  Alkaline Phos 38 - 126 U/L 58 61 66  AST 15 - 41 U/L 33 32 37  ALT 0 - 44 U/L 12 13 17        DIAGNOSTIC IMAGING:  I have independently reviewed the images with the patient.   I have reviewed Venita Lick LPN's note and agree with the documentation.  I personally performed a face-to-face visit, made revisions and my assessment and plan is as follows.    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  Metastatic carcinoid tumor to the liver and peritoneum: -He has been on Sandostatin over 5 years.  Everolimus 5 mg daily started on 05/17/2018. -Gallium-68 dotatate scan on 04/08/2019 revealed no evidence of disease progression.  Stable exam compared to May 2020 scan. -He is tolerating everolimus 5 mg daily very well. -I reviewed PET scan from 10/29/2019.  Multifocal well-differentiated neuroendocrine tumor with intense avid lesions in the liver, mesentery and peritoneal space.  No clear increase in size of the lesions however many lesions have increased in radiotracer activity. -His LFTs are grossly normal.  He will continue Sandostatin injection today.  We will reevaluate him in a month.  2.  Normocytic anemia: -His hemoglobin today is 10.7.  This is from combination of myelosuppression and ESRD. -Iron panel on 08/10/2019 shows ferritin of 454 and percent saturation of 50.  3.  ESRD on HD: -Started on HD on 05/20/2019.  He gets dialyzed Tuesday Thursday and Saturday.   Orders placed this encounter:  No orders of the defined types were placed in this encounter.     Derek Jack, MD [place] [phone]

## 2019-11-05 NOTE — Assessment & Plan Note (Signed)
1.  Metastatic carcinoid tumor to the liver and peritoneum: -He has been on Sandostatin over 5 years.  Everolimus 5 mg daily started on 05/17/2018. -Gallium-68 dotatate scan on 04/08/2019 revealed no evidence of disease progression.  Stable exam compared to May 2020 scan. -He is tolerating everolimus 5 mg daily very well. -I reviewed PET scan from 10/29/2019.  Multifocal well-differentiated neuroendocrine tumor with intense avid lesions in the liver, mesentery and peritoneal space.  No clear increase in size of the lesions however many lesions have increased in radiotracer activity. -His LFTs are grossly normal.  He will continue Sandostatin injection today.  We will reevaluate him in a month.  2.  Normocytic anemia: -His hemoglobin today is 10.7.  This is from combination of myelosuppression and ESRD. -Iron panel on 08/10/2019 shows ferritin of 454 and percent saturation of 50.  3.  ESRD on HD: -Started on HD on 05/20/2019.  He gets dialyzed Tuesday Thursday and Saturday.

## 2019-11-05 NOTE — Progress Notes (Signed)
Patient tolerated Sandostatin injection with no complaints voiced.  See MAR for details.  Site clean and dry with no bruising or swelling noted at site.  Band aid applied.  Vss with discharge and left ambulatory with no s/s of distress noted.

## 2019-11-07 DIAGNOSIS — Z992 Dependence on renal dialysis: Secondary | ICD-10-CM | POA: Diagnosis not present

## 2019-11-07 DIAGNOSIS — D509 Iron deficiency anemia, unspecified: Secondary | ICD-10-CM | POA: Diagnosis not present

## 2019-11-07 DIAGNOSIS — D631 Anemia in chronic kidney disease: Secondary | ICD-10-CM | POA: Diagnosis not present

## 2019-11-07 DIAGNOSIS — N186 End stage renal disease: Secondary | ICD-10-CM | POA: Diagnosis not present

## 2019-11-07 DIAGNOSIS — Z1159 Encounter for screening for other viral diseases: Secondary | ICD-10-CM | POA: Diagnosis not present

## 2019-11-08 DIAGNOSIS — D509 Iron deficiency anemia, unspecified: Secondary | ICD-10-CM | POA: Diagnosis not present

## 2019-11-08 DIAGNOSIS — N186 End stage renal disease: Secondary | ICD-10-CM | POA: Diagnosis not present

## 2019-11-08 DIAGNOSIS — D631 Anemia in chronic kidney disease: Secondary | ICD-10-CM | POA: Diagnosis not present

## 2019-11-08 DIAGNOSIS — Z1159 Encounter for screening for other viral diseases: Secondary | ICD-10-CM | POA: Diagnosis not present

## 2019-11-08 DIAGNOSIS — Z992 Dependence on renal dialysis: Secondary | ICD-10-CM | POA: Diagnosis not present

## 2019-11-11 DIAGNOSIS — Z1159 Encounter for screening for other viral diseases: Secondary | ICD-10-CM | POA: Diagnosis not present

## 2019-11-11 DIAGNOSIS — N186 End stage renal disease: Secondary | ICD-10-CM | POA: Diagnosis not present

## 2019-11-11 DIAGNOSIS — Z992 Dependence on renal dialysis: Secondary | ICD-10-CM | POA: Diagnosis not present

## 2019-11-11 DIAGNOSIS — D631 Anemia in chronic kidney disease: Secondary | ICD-10-CM | POA: Diagnosis not present

## 2019-11-11 DIAGNOSIS — D509 Iron deficiency anemia, unspecified: Secondary | ICD-10-CM | POA: Diagnosis not present

## 2019-11-13 DIAGNOSIS — N186 End stage renal disease: Secondary | ICD-10-CM | POA: Diagnosis not present

## 2019-11-13 DIAGNOSIS — Z1159 Encounter for screening for other viral diseases: Secondary | ICD-10-CM | POA: Diagnosis not present

## 2019-11-13 DIAGNOSIS — D631 Anemia in chronic kidney disease: Secondary | ICD-10-CM | POA: Diagnosis not present

## 2019-11-13 DIAGNOSIS — Z992 Dependence on renal dialysis: Secondary | ICD-10-CM | POA: Diagnosis not present

## 2019-11-13 DIAGNOSIS — D509 Iron deficiency anemia, unspecified: Secondary | ICD-10-CM | POA: Diagnosis not present

## 2019-11-14 DIAGNOSIS — Z992 Dependence on renal dialysis: Secondary | ICD-10-CM | POA: Diagnosis not present

## 2019-11-14 DIAGNOSIS — N185 Chronic kidney disease, stage 5: Secondary | ICD-10-CM | POA: Diagnosis not present

## 2019-11-14 DIAGNOSIS — I10 Essential (primary) hypertension: Secondary | ICD-10-CM | POA: Diagnosis not present

## 2019-11-14 DIAGNOSIS — E7849 Other hyperlipidemia: Secondary | ICD-10-CM | POA: Diagnosis not present

## 2019-11-14 DIAGNOSIS — M818 Other osteoporosis without current pathological fracture: Secondary | ICD-10-CM | POA: Diagnosis not present

## 2019-11-14 DIAGNOSIS — N186 End stage renal disease: Secondary | ICD-10-CM | POA: Diagnosis not present

## 2019-11-15 DIAGNOSIS — Z1159 Encounter for screening for other viral diseases: Secondary | ICD-10-CM | POA: Diagnosis not present

## 2019-11-15 DIAGNOSIS — Z992 Dependence on renal dialysis: Secondary | ICD-10-CM | POA: Diagnosis not present

## 2019-11-15 DIAGNOSIS — D509 Iron deficiency anemia, unspecified: Secondary | ICD-10-CM | POA: Diagnosis not present

## 2019-11-15 DIAGNOSIS — N186 End stage renal disease: Secondary | ICD-10-CM | POA: Diagnosis not present

## 2019-11-15 DIAGNOSIS — D631 Anemia in chronic kidney disease: Secondary | ICD-10-CM | POA: Diagnosis not present

## 2019-11-18 DIAGNOSIS — Z992 Dependence on renal dialysis: Secondary | ICD-10-CM | POA: Diagnosis not present

## 2019-11-18 DIAGNOSIS — Z1159 Encounter for screening for other viral diseases: Secondary | ICD-10-CM | POA: Diagnosis not present

## 2019-11-18 DIAGNOSIS — D509 Iron deficiency anemia, unspecified: Secondary | ICD-10-CM | POA: Diagnosis not present

## 2019-11-18 DIAGNOSIS — D631 Anemia in chronic kidney disease: Secondary | ICD-10-CM | POA: Diagnosis not present

## 2019-11-18 DIAGNOSIS — N186 End stage renal disease: Secondary | ICD-10-CM | POA: Diagnosis not present

## 2019-11-20 DIAGNOSIS — Z992 Dependence on renal dialysis: Secondary | ICD-10-CM | POA: Diagnosis not present

## 2019-11-20 DIAGNOSIS — D509 Iron deficiency anemia, unspecified: Secondary | ICD-10-CM | POA: Diagnosis not present

## 2019-11-20 DIAGNOSIS — D631 Anemia in chronic kidney disease: Secondary | ICD-10-CM | POA: Diagnosis not present

## 2019-11-20 DIAGNOSIS — Z1159 Encounter for screening for other viral diseases: Secondary | ICD-10-CM | POA: Diagnosis not present

## 2019-11-20 DIAGNOSIS — N186 End stage renal disease: Secondary | ICD-10-CM | POA: Diagnosis not present

## 2019-11-22 DIAGNOSIS — Z992 Dependence on renal dialysis: Secondary | ICD-10-CM | POA: Diagnosis not present

## 2019-11-22 DIAGNOSIS — N186 End stage renal disease: Secondary | ICD-10-CM | POA: Diagnosis not present

## 2019-11-22 DIAGNOSIS — D509 Iron deficiency anemia, unspecified: Secondary | ICD-10-CM | POA: Diagnosis not present

## 2019-11-22 DIAGNOSIS — Z1159 Encounter for screening for other viral diseases: Secondary | ICD-10-CM | POA: Diagnosis not present

## 2019-11-22 DIAGNOSIS — D631 Anemia in chronic kidney disease: Secondary | ICD-10-CM | POA: Diagnosis not present

## 2019-11-25 DIAGNOSIS — N186 End stage renal disease: Secondary | ICD-10-CM | POA: Diagnosis not present

## 2019-11-25 DIAGNOSIS — Z1159 Encounter for screening for other viral diseases: Secondary | ICD-10-CM | POA: Diagnosis not present

## 2019-11-25 DIAGNOSIS — D509 Iron deficiency anemia, unspecified: Secondary | ICD-10-CM | POA: Diagnosis not present

## 2019-11-25 DIAGNOSIS — D631 Anemia in chronic kidney disease: Secondary | ICD-10-CM | POA: Diagnosis not present

## 2019-11-25 DIAGNOSIS — Z992 Dependence on renal dialysis: Secondary | ICD-10-CM | POA: Diagnosis not present

## 2019-11-27 DIAGNOSIS — N186 End stage renal disease: Secondary | ICD-10-CM | POA: Diagnosis not present

## 2019-11-27 DIAGNOSIS — D631 Anemia in chronic kidney disease: Secondary | ICD-10-CM | POA: Diagnosis not present

## 2019-11-27 DIAGNOSIS — Z1159 Encounter for screening for other viral diseases: Secondary | ICD-10-CM | POA: Diagnosis not present

## 2019-11-27 DIAGNOSIS — D509 Iron deficiency anemia, unspecified: Secondary | ICD-10-CM | POA: Diagnosis not present

## 2019-11-27 DIAGNOSIS — Z992 Dependence on renal dialysis: Secondary | ICD-10-CM | POA: Diagnosis not present

## 2019-11-29 DIAGNOSIS — N186 End stage renal disease: Secondary | ICD-10-CM | POA: Diagnosis not present

## 2019-11-29 DIAGNOSIS — Z1159 Encounter for screening for other viral diseases: Secondary | ICD-10-CM | POA: Diagnosis not present

## 2019-11-29 DIAGNOSIS — Z992 Dependence on renal dialysis: Secondary | ICD-10-CM | POA: Diagnosis not present

## 2019-11-29 DIAGNOSIS — D509 Iron deficiency anemia, unspecified: Secondary | ICD-10-CM | POA: Diagnosis not present

## 2019-11-29 DIAGNOSIS — D631 Anemia in chronic kidney disease: Secondary | ICD-10-CM | POA: Diagnosis not present

## 2019-12-02 DIAGNOSIS — Z1159 Encounter for screening for other viral diseases: Secondary | ICD-10-CM | POA: Diagnosis not present

## 2019-12-02 DIAGNOSIS — N186 End stage renal disease: Secondary | ICD-10-CM | POA: Diagnosis not present

## 2019-12-02 DIAGNOSIS — D631 Anemia in chronic kidney disease: Secondary | ICD-10-CM | POA: Diagnosis not present

## 2019-12-02 DIAGNOSIS — D509 Iron deficiency anemia, unspecified: Secondary | ICD-10-CM | POA: Diagnosis not present

## 2019-12-02 DIAGNOSIS — Z992 Dependence on renal dialysis: Secondary | ICD-10-CM | POA: Diagnosis not present

## 2019-12-03 ENCOUNTER — Inpatient Hospital Stay (HOSPITAL_COMMUNITY): Payer: Medicare Other | Attending: Hematology | Admitting: Hematology

## 2019-12-03 ENCOUNTER — Other Ambulatory Visit: Payer: Self-pay

## 2019-12-03 ENCOUNTER — Inpatient Hospital Stay (HOSPITAL_COMMUNITY): Payer: Medicare Other

## 2019-12-03 ENCOUNTER — Encounter (HOSPITAL_COMMUNITY): Payer: Self-pay | Admitting: Hematology

## 2019-12-03 VITALS — BP 97/58 | HR 71 | Temp 98.6°F | Resp 16 | Wt 196.2 lb

## 2019-12-03 DIAGNOSIS — C7A00 Malignant carcinoid tumor of unspecified site: Secondary | ICD-10-CM | POA: Diagnosis not present

## 2019-12-03 DIAGNOSIS — C179 Malignant neoplasm of small intestine, unspecified: Secondary | ICD-10-CM | POA: Diagnosis not present

## 2019-12-03 DIAGNOSIS — C7B02 Secondary carcinoid tumors of liver: Secondary | ICD-10-CM | POA: Diagnosis not present

## 2019-12-03 DIAGNOSIS — D500 Iron deficiency anemia secondary to blood loss (chronic): Secondary | ICD-10-CM

## 2019-12-03 LAB — CBC WITH DIFFERENTIAL/PLATELET
Abs Immature Granulocytes: 0.01 10*3/uL (ref 0.00–0.07)
Basophils Absolute: 0 10*3/uL (ref 0.0–0.1)
Basophils Relative: 1 %
Eosinophils Absolute: 0.2 10*3/uL (ref 0.0–0.5)
Eosinophils Relative: 4 %
HCT: 32.4 % — ABNORMAL LOW (ref 39.0–52.0)
Hemoglobin: 10.3 g/dL — ABNORMAL LOW (ref 13.0–17.0)
Immature Granulocytes: 0 %
Lymphocytes Relative: 12 %
Lymphs Abs: 0.6 10*3/uL — ABNORMAL LOW (ref 0.7–4.0)
MCH: 28.9 pg (ref 26.0–34.0)
MCHC: 31.8 g/dL (ref 30.0–36.0)
MCV: 91 fL (ref 80.0–100.0)
Monocytes Absolute: 0.7 10*3/uL (ref 0.1–1.0)
Monocytes Relative: 14 %
Neutro Abs: 3.4 10*3/uL (ref 1.7–7.7)
Neutrophils Relative %: 69 %
Platelets: 163 10*3/uL (ref 150–400)
RBC: 3.56 MIL/uL — ABNORMAL LOW (ref 4.22–5.81)
RDW: 14.1 % (ref 11.5–15.5)
WBC: 4.8 10*3/uL (ref 4.0–10.5)
nRBC: 0 % (ref 0.0–0.2)

## 2019-12-03 LAB — COMPREHENSIVE METABOLIC PANEL
ALT: 11 U/L (ref 0–44)
AST: 29 U/L (ref 15–41)
Albumin: 3.8 g/dL (ref 3.5–5.0)
Alkaline Phosphatase: 69 U/L (ref 38–126)
Anion gap: 14 (ref 5–15)
BUN: 36 mg/dL — ABNORMAL HIGH (ref 8–23)
CO2: 26 mmol/L (ref 22–32)
Calcium: 8.4 mg/dL — ABNORMAL LOW (ref 8.9–10.3)
Chloride: 97 mmol/L — ABNORMAL LOW (ref 98–111)
Creatinine, Ser: 6.3 mg/dL — ABNORMAL HIGH (ref 0.61–1.24)
GFR calc Af Amer: 9 mL/min — ABNORMAL LOW (ref >60)
GFR calc non Af Amer: 8 mL/min — ABNORMAL LOW (ref >60)
Glucose, Bld: 101 mg/dL — ABNORMAL HIGH (ref 70–99)
Potassium: 3.6 mmol/L (ref 3.5–5.1)
Sodium: 137 mmol/L (ref 135–145)
Total Bilirubin: 0.6 mg/dL (ref 0.3–1.2)
Total Protein: 6.8 g/dL (ref 6.5–8.1)

## 2019-12-03 LAB — LACTATE DEHYDROGENASE: LDH: 162 U/L (ref 98–192)

## 2019-12-03 MED ORDER — OCTREOTIDE ACETATE 30 MG IM KIT
30.0000 mg | PACK | Freq: Once | INTRAMUSCULAR | Status: AC
  Administered 2019-12-03: 30 mg via INTRAMUSCULAR

## 2019-12-03 NOTE — Patient Instructions (Signed)
[place] at [facility] Discharge Instructions  You were seen today by Dr. Delton Coombes. He went over your recent results. Your labs today look good. You will continue your treatment monthly. He will see you back in 2 months for labs and follow up.    Thank you for choosing [place] at [facility] to provide your oncology and hematology care.  To afford each patient quality time with our provider, please arrive at least 15 minutes before your scheduled appointment time.   If you have a lab appointment with the [facility] please come in thru the  Main Entrance and check in at the main information desk  You need to re-schedule your appointment should you arrive 10 or more minutes late.  We strive to give you quality time with our providers, and arriving late affects you and other patients whose appointments are after yours.  Also, if you no show three or more times for appointments you may be dismissed from the clinic at the providers discretion.     Again, thank you for choosing [facility].  Our hope is that these requests will decrease the amount of time that you wait before being seen by our physicians.       _____________________________________________________________  Should you have questions after your visit to [facility], please contact our office at [phone] between the hours of 8:00 a.m. and 4:30 p.m.  Voicemails left after 4:00 p.m. will not be returned until the following business day.  For prescription refill requests, have your pharmacy contact our office and allow 72 hours.    Cancer Center Support Programs:   > Cancer Support Group  2nd Tuesday of the month 1pm-2pm, Journey Room

## 2019-12-03 NOTE — Progress Notes (Signed)
[place] reviewed with and pt seen by Dr. Delton Coombes and pt approved for Sandostatin injection today per MD                            [place] A Walen tolerated Sandostatin injection well without complaints or incident Pt discharged self ambulatory in satisfactory condition

## 2019-12-03 NOTE — Patient Instructions (Signed)
[facility] at [facility] Discharge Instructions  Received Sandostatin injection today. Follow-up as scheduled. Call clinic for any questions or concerns   Thank you for choosing [facility] at [facility] to provide your oncology and hematology care.  To afford each patient quality time with our provider, please arrive at least 15 minutes before your scheduled appointment time.   If you have a lab appointment with the Cancer Center please come in thru the Main Entrance and check in at the main information desk.  You need to re-schedule your appointment should you arrive 10 or more minutes late.  We strive to give you quality time with our providers, and arriving late affects you and other patients whose appointments are after yours.  Also, if you no show three or more times for appointments you may be dismissed from the clinic at the providers discretion.     Again, thank you for choosing [facility].  Our hope is that these requests will decrease the amount of time that you wait before being seen by our physicians.       _____________________________________________________________  Should you have questions after your visit to [facility], please contact our office at [phone] between the hours of 8:00 a.m. and 4:30 p.m.  Voicemails left after 4:00 p.m. will not be returned until the following business day.  For prescription refill requests, have your pharmacy contact our office and allow 72 hours.    Due to Covid, you will need to wear a mask upon entering the hospital. If you do not have a mask, a mask will be given to you at the Main Entrance upon arrival. For doctor visits, patients may have 1 support person with them. For treatment visits, patients can not have anyone with them due to social distancing guidelines and our immunocompromised population.     

## 2019-12-03 NOTE — Assessment & Plan Note (Signed)
1.  Malignant carcinoid tumor to the liver and peritoneum: -Sandostatin started back 5 years ago.  Everolimus 5 mg daily started on 05/17/2018. -PET scan on 10/29/2019 showed multifocal well-differentiated neuroendocrine tumor with intense avid lesions in the liver, mesentery and peritoneal space.  No clear increase in size of the lesions.  However many lesions have increased in radiotracer activity. -He does not have any symptoms or signs of carcinoid syndrome. -I have reviewed his labs from 12/03/2019 which showed normal LFTs.  LDH was normal.  White count and platelet count was normal. -He will continue everolimus and Sandostatin.  I will reevaluate him in 2 months with repeat labs.  I plan to repeat serum chromogranin.  2.  Normocytic anemia: -Hemoglobin today is 10.3 with MCV of 91.  This is from myelosuppression from everolimus and end-stage renal disease. -We will monitor frequent iron panels.  3.  ESRD on HD: -Started on HD on 05/20/2019.  He is dialyzed Tuesday, Thursday and Saturday.  He is tolerating it very well.

## 2019-12-03 NOTE — Progress Notes (Signed)
[place], [place] [address]   CLINIC:  Medical Oncology/Hematology  PCP:  [name], MD [place] / [place] [id number] [phone]   REASON FOR VISIT:  Follow-up for metastatic carcinoid tumor  CURRENT THERAPY: Continue with planned chemotherapy treatment: Everolimus 5 mg daily and Sandostatin monthly.  BRIEF ONCOLOGIC HISTORY:  Oncology History   No history exists.    CANCER STAGING: Cancer Staging No matching staging information was found for the patient.  INTERVAL HISTORY:  Allen Davenport, a 79 y.o. male, returns for routine follow-up of his metastatic carcinoid tumor. Champion was last seen on 11/05/2019. He declines issues with the pill.  Denies abdominal pain, diarrhea, wheezing or flushing.   REVIEW OF SYSTEMS:  Review of Systems  Constitutional: Negative for appetite change, chills, fatigue and fever.  HENT:   Negative for lump/mass, mouth sores, sore throat and trouble swallowing.   Eyes: Negative for eye problems.  Respiratory: Negative for chest tightness, cough, shortness of breath and wheezing.   Cardiovascular: Negative for chest pain and palpitations.  Gastrointestinal: Negative for abdominal pain, constipation, diarrhea, nausea and vomiting.  Genitourinary: Negative for bladder incontinence, dysuria, frequency and hematuria.   Musculoskeletal: Negative for arthralgias, back pain, flank pain and myalgias.  Skin: Negative for rash.  Neurological: Negative for dizziness, headaches, light-headedness and numbness.  Hematological: Does not bruise/bleed easily.  Psychiatric/Behavioral: Negative for depression. The patient is not nervous/anxious.     PAST MEDICAL/SURGICAL HISTORY:  Past Medical History:  Diagnosis Date  . Anemia   . Cancer [facility])    right renal . Prostate- Radiation treatment  . CHF (congestive heart failure) ([place])   . Chronic kidney disease    Dialysis T/TH/Sa  . Edema   . Heart  murmur    "nothing to worry about"  . Hyperlipidemia   . Hypertension   . Malignant carcinoid tumor ([place]) 01/03/2016  . Pneumonia    as a child   Past Surgical History:  Procedure Laterality Date  . AV FISTULA PLACEMENT Left 10/20/2014   Procedure: Left Arm ARTERIOVENOUS (AV) FISTULA CREATION;  Surgeon: Elam Dutch, MD;  Location: [place];  Service: Vascular;  Laterality: Left;  . NEPHRECTOMY Right 2010  . PERIPHERAL VASCULAR BALLOON ANGIOPLASTY  06/20/2019   Procedure: PERIPHERAL VASCULAR BALLOON ANGIOPLASTY;  Surgeon: Elam Dutch, MD;  Location: [place] CV [facility];  Service: Cardiovascular;;  . REVISON OF ARTERIOVENOUS FISTULA Left 08/04/2019   Procedure: REVISON OF ARTERIOVENOUS FISTULA WITH SIDE BRANCH LIGATION;  Surgeon: Elam Dutch, MD;  Location[facility];  Service: Vascular;  Laterality: Left;    SOCIAL HISTORY:  Social History   Socioeconomic History  . Marital status: Married    Spouse name: Not on file  . Number of children: Not on file  . Years of education: Not on file  . Highest education level: Not on file  Occupational History  . Not on file  Tobacco Use  . Smoking status: Never Smoker  . Smokeless tobacco: Never Used  Substance and Sexual Activity  . Alcohol use: No    Alcohol/week: 0.0 standard drinks  . Drug use: No  . Sexual activity: Not on file  Other Topics Concern  . Not on file  Social History Narrative  . Not on file   Social Determinants of Health   Financial Resource Strain:   . Difficulty of Paying Living Expenses:   Food Insecurity:   . Worried  About Running Out of Food in the Last Year:   . [place] in the Last Year:   Transportation Needs:   . Lack of Transportation (Medical):   [name] Lack of Transportation (Non-Medical):   Physical Activity:   . Days of Exercise per Week:   . Minutes of Exercise per Session:   Stress:   . Feeling of Stress :   Social Connections:   . Frequency of Communication with Friends and  Family:   . Frequency of Social Gatherings with Friends and Family:   . Attends Religious Services:   . Active Member of Clubs or Organizations:   . Attends Archivist Meetings:   [name] Marital Status:   Intimate Partner Violence:   . Fear of Current or Ex-Partner:   . Emotionally Abused:   [name] Physically Abused:   . Sexually Abused:     FAMILY HISTORY:  Family History  Problem Relation Age of Onset  . Cancer Mother   . Hypertension Father   . Cancer Sister     CURRENT MEDICATIONS:  Current Outpatient Medications  Medication Sig Dispense Refill  . alendronate (FOSAMAX) 70 MG tablet Take 70 mg by mouth every Sunday.     [name] amLODipine (NORVASC) 10 MG tablet Take 10 mg by mouth daily.    [name] atorvastatin (LIPITOR) 80 MG tablet Take 80 mg by mouth daily at 6 PM.     . calcitRIOL (ROCALTROL) 0.25 MCG capsule Take 0.25 mcg by mouth daily.     . carvedilol (COREG) 3.125 MG tablet Take 3.125 mg by mouth 2 (two) times daily with a meal.     . everolimus (AFINITOR) 5 MG tablet Take 1 tablet (5 mg total) by mouth daily. 28 tablet 2  . furosemide (LASIX) 40 MG tablet Take 40 mg by mouth every morning.    . iron polysaccharides (NIFEREX) 150 MG capsule Take 150 mg by mouth daily.     [name] isoniazid (NYDRAZID) 300 MG tablet Take 1 tablet (300 mg total) by mouth daily. 30 tablet 8  . metolazone (ZAROXOLYN) 5 MG tablet Take 5 mg by mouth every other day.    [name] octreotide (SANDOSTATIN LAR DEPOT) 30 MG injection Inject 30 mg into the muscle every 28 (twenty-eight) days.     . Omega-3 Fatty Acids (FISH OIL) 1000 MG CAPS Take 1,000 mg by mouth daily.     [name] pyridOXINE (B-6) 50 MG tablet Take 1 tablet (50 mg total) by mouth daily. 30 tablet 8  . sodium bicarbonate 650 MG tablet Take 650 mg by mouth daily.    . tamsulosin (FLOMAX) 0.4 MG CAPS capsule Take 0.4 mg by mouth daily.      No current facility-administered medications for this visit.    ALLERGIES:  No Known Allergies  PHYSICAL EXAM:    Performance status (ECOG): 1 - Symptomatic but completely ambulatory  Vitals:   12/03/19 1458  BP: (!) 97/58  Pulse: 71  Resp: 16  Temp: 98.6 F (37 C)  SpO2: 100%   Wt Readings from Last 3 Encounters:  12/03/19 196 lb 3 oz (89 kg)  11/05/19 197 lb 9.6 oz (89.6 kg)  10/01/19 196 lb 5 oz (89 kg)   Physical Exam Constitutional:      Appearance: Normal appearance.  HENT:     Nose: No congestion.     Mouth/Throat:     Mouth: Mucous membranes are moist.  Eyes:     Extraocular Movements: Extraocular movements intact.  Pupils: Pupils are equal, round, and reactive to light.  Cardiovascular:     Rate and Rhythm: Normal rate and regular rhythm.     Heart sounds: No murmur. No gallop.   Pulmonary:     Breath sounds: No wheezing, rhonchi or rales.  Abdominal:     Tenderness: There is no abdominal tenderness.  Musculoskeletal:        General: No tenderness.     Cervical back: Normal range of motion. No tenderness.     Right lower leg: No edema.     Left lower leg: No edema.  Skin:    General: Skin is warm and dry.     Findings: No bruising, erythema or rash.  Neurological:     Mental Status: He is alert and oriented to person, place, and time.     Sensory: No sensory deficit.     Motor: No weakness.  Psychiatric:        Mood and Affect: Mood normal.        Behavior: Behavior normal.        Thought Content: Thought content normal.        Judgment: Judgment normal.      LABORATORY DATA:  I have reviewed the labs as listed.  CBC Latest Ref Rng & Units 12/03/2019 11/05/2019 10/01/2019  WBC 4.0 - 10.5 K/uL 4.8 4.2 4.4  Hemoglobin 13.0 - 17.0 g/dL 10.3(L) 10.7(L) 11.6(L)  Hematocrit 39.0 - 52.0 % 32.4(L) 33.7(L) 35.7(L)  Platelets 150 - 400 K/uL 163 138(L) 134(L)   CMP Latest Ref Rng & Units 12/03/2019 11/05/2019 10/01/2019  Glucose 70 - 99 mg/dL 101(H) 161(H) 149(H)  BUN 8 - 23 mg/dL 36(H) 37(H) 38(H)  Creatinine 0.61 - 1.24 mg/dL 6.30(H) 6.22(H) 5.87(H)  Sodium 135 -  145 mmol/L 137 138 136  Potassium 3.5 - 5.1 mmol/L 3.6 4.1 3.2(L)  Chloride 98 - 111 mmol/L 97(L) 96(L) 94(L)  CO2 22 - 32 mmol/L 26 30 28   Calcium 8.9 - 10.3 mg/dL 8.4(L) 8.4(L) 8.3(L)  Total Protein 6.5 - 8.1 g/dL 6.8 6.2(L) 6.6  Total Bilirubin 0.3 - 1.2 mg/dL 0.6 0.8 0.5  Alkaline Phos 38 - 126 U/L 69 58 61  AST 15 - 41 U/L 29 33 32  ALT 0 - 44 U/L 11 12 13     DIAGNOSTIC IMAGING:  I have independently reviewed the scans and discussed with the patient.   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  Malignant carcinoid tumor to the liver and peritoneum: -Sandostatin started back 5 years ago.  Everolimus 5 mg daily started on 05/17/2018. -PET scan on 10/29/2019 showed multifocal well-differentiated neuroendocrine tumor with intense avid lesions in the liver, mesentery and peritoneal space.  No clear increase in size of the lesions.  However many lesions have increased in radiotracer activity. -He does not have any symptoms or signs of carcinoid syndrome. -I have reviewed his labs from 12/03/2019 which showed normal LFTs.  LDH was normal.  White count and platelet count was normal. -He will continue everolimus and Sandostatin.  I will reevaluate him in 2 months with repeat labs.  I plan to repeat serum chromogranin.  2.  Normocytic anemia: -Hemoglobin today is 10.3 with MCV of 91.  This is from myelosuppression from everolimus and end-stage renal disease. -We will monitor frequent iron panels.  3.  ESRD on HD: -Started on HD on 05/20/2019.  He is dialyzed Tuesday, Thursday and Saturday.  He is tolerating it very well.  Orders placed this encounter:  Orders Placed This Encounter  Procedures  . CBC with Differential/Platelet  . Comprehensive metabolic panel  . Lactate dehydrogenase  . Chromogranin A        Derek Jack, MD, 12/03/19 4:57 PM  [place] [phone]   I, Jacqualyn Posey, am acting as a scribe for Dr. Sanda Linger.  I,  Derek Jack MD, have reviewed the above documentation for accuracy and completeness, and I agree with the above.

## 2019-12-04 DIAGNOSIS — D509 Iron deficiency anemia, unspecified: Secondary | ICD-10-CM | POA: Diagnosis not present

## 2019-12-04 DIAGNOSIS — N186 End stage renal disease: Secondary | ICD-10-CM | POA: Diagnosis not present

## 2019-12-04 DIAGNOSIS — D631 Anemia in chronic kidney disease: Secondary | ICD-10-CM | POA: Diagnosis not present

## 2019-12-04 DIAGNOSIS — Z1159 Encounter for screening for other viral diseases: Secondary | ICD-10-CM | POA: Diagnosis not present

## 2019-12-04 DIAGNOSIS — Z992 Dependence on renal dialysis: Secondary | ICD-10-CM | POA: Diagnosis not present

## 2019-12-05 LAB — CHROMOGRANIN A: Chromogranin A (ng/mL): 600.1 ng/mL — ABNORMAL HIGH (ref 0.0–101.8)

## 2019-12-06 DIAGNOSIS — Z992 Dependence on renal dialysis: Secondary | ICD-10-CM | POA: Diagnosis not present

## 2019-12-06 DIAGNOSIS — N186 End stage renal disease: Secondary | ICD-10-CM | POA: Diagnosis not present

## 2019-12-06 DIAGNOSIS — D509 Iron deficiency anemia, unspecified: Secondary | ICD-10-CM | POA: Diagnosis not present

## 2019-12-06 DIAGNOSIS — D631 Anemia in chronic kidney disease: Secondary | ICD-10-CM | POA: Diagnosis not present

## 2019-12-06 DIAGNOSIS — Z1159 Encounter for screening for other viral diseases: Secondary | ICD-10-CM | POA: Diagnosis not present

## 2019-12-09 DIAGNOSIS — D631 Anemia in chronic kidney disease: Secondary | ICD-10-CM | POA: Diagnosis not present

## 2019-12-09 DIAGNOSIS — Z1159 Encounter for screening for other viral diseases: Secondary | ICD-10-CM | POA: Diagnosis not present

## 2019-12-09 DIAGNOSIS — D509 Iron deficiency anemia, unspecified: Secondary | ICD-10-CM | POA: Diagnosis not present

## 2019-12-09 DIAGNOSIS — Z992 Dependence on renal dialysis: Secondary | ICD-10-CM | POA: Diagnosis not present

## 2019-12-09 DIAGNOSIS — N186 End stage renal disease: Secondary | ICD-10-CM | POA: Diagnosis not present

## 2019-12-10 DIAGNOSIS — Z125 Encounter for screening for malignant neoplasm of prostate: Secondary | ICD-10-CM | POA: Diagnosis not present

## 2019-12-10 DIAGNOSIS — M818 Other osteoporosis without current pathological fracture: Secondary | ICD-10-CM | POA: Diagnosis not present

## 2019-12-10 DIAGNOSIS — E7849 Other hyperlipidemia: Secondary | ICD-10-CM | POA: Diagnosis not present

## 2019-12-10 DIAGNOSIS — N185 Chronic kidney disease, stage 5: Secondary | ICD-10-CM | POA: Diagnosis not present

## 2019-12-10 DIAGNOSIS — I10 Essential (primary) hypertension: Secondary | ICD-10-CM | POA: Diagnosis not present

## 2019-12-11 DIAGNOSIS — D509 Iron deficiency anemia, unspecified: Secondary | ICD-10-CM | POA: Diagnosis not present

## 2019-12-11 DIAGNOSIS — D631 Anemia in chronic kidney disease: Secondary | ICD-10-CM | POA: Diagnosis not present

## 2019-12-11 DIAGNOSIS — Z1159 Encounter for screening for other viral diseases: Secondary | ICD-10-CM | POA: Diagnosis not present

## 2019-12-11 DIAGNOSIS — N186 End stage renal disease: Secondary | ICD-10-CM | POA: Diagnosis not present

## 2019-12-11 DIAGNOSIS — Z992 Dependence on renal dialysis: Secondary | ICD-10-CM | POA: Diagnosis not present

## 2019-12-12 DIAGNOSIS — I10 Essential (primary) hypertension: Secondary | ICD-10-CM | POA: Diagnosis not present

## 2019-12-12 DIAGNOSIS — E7849 Other hyperlipidemia: Secondary | ICD-10-CM | POA: Diagnosis not present

## 2019-12-12 DIAGNOSIS — M818 Other osteoporosis without current pathological fracture: Secondary | ICD-10-CM | POA: Diagnosis not present

## 2019-12-12 DIAGNOSIS — N185 Chronic kidney disease, stage 5: Secondary | ICD-10-CM | POA: Diagnosis not present

## 2019-12-13 DIAGNOSIS — D509 Iron deficiency anemia, unspecified: Secondary | ICD-10-CM | POA: Diagnosis not present

## 2019-12-13 DIAGNOSIS — Z1159 Encounter for screening for other viral diseases: Secondary | ICD-10-CM | POA: Diagnosis not present

## 2019-12-13 DIAGNOSIS — N186 End stage renal disease: Secondary | ICD-10-CM | POA: Diagnosis not present

## 2019-12-13 DIAGNOSIS — Z992 Dependence on renal dialysis: Secondary | ICD-10-CM | POA: Diagnosis not present

## 2019-12-13 DIAGNOSIS — D631 Anemia in chronic kidney disease: Secondary | ICD-10-CM | POA: Diagnosis not present

## 2019-12-15 DIAGNOSIS — N186 End stage renal disease: Secondary | ICD-10-CM | POA: Diagnosis not present

## 2019-12-15 DIAGNOSIS — Z992 Dependence on renal dialysis: Secondary | ICD-10-CM | POA: Diagnosis not present

## 2019-12-22 DIAGNOSIS — E1140 Type 2 diabetes mellitus with diabetic neuropathy, unspecified: Secondary | ICD-10-CM | POA: Diagnosis not present

## 2019-12-22 DIAGNOSIS — E1151 Type 2 diabetes mellitus with diabetic peripheral angiopathy without gangrene: Secondary | ICD-10-CM | POA: Diagnosis not present

## 2019-12-24 DIAGNOSIS — Z8521 Personal history of malignant neoplasm of larynx: Secondary | ICD-10-CM | POA: Diagnosis not present

## 2019-12-24 DIAGNOSIS — R490 Dysphonia: Secondary | ICD-10-CM | POA: Diagnosis not present

## 2019-12-24 DIAGNOSIS — H6121 Impacted cerumen, right ear: Secondary | ICD-10-CM | POA: Diagnosis not present

## 2019-12-31 ENCOUNTER — Inpatient Hospital Stay (HOSPITAL_COMMUNITY): Payer: Medicare Other | Attending: Hematology

## 2019-12-31 ENCOUNTER — Encounter: Payer: Self-pay | Admitting: Internal Medicine

## 2019-12-31 ENCOUNTER — Other Ambulatory Visit: Payer: Self-pay

## 2019-12-31 ENCOUNTER — Ambulatory Visit (INDEPENDENT_AMBULATORY_CARE_PROVIDER_SITE_OTHER): Payer: Medicare Other | Admitting: Internal Medicine

## 2019-12-31 ENCOUNTER — Encounter (HOSPITAL_COMMUNITY): Payer: Self-pay

## 2019-12-31 VITALS — BP 105/53 | HR 78 | Temp 97.8°F | Resp 18

## 2019-12-31 VITALS — BP 101/67 | HR 89 | Temp 98.2°F | Wt 192.6 lb

## 2019-12-31 DIAGNOSIS — C7A00 Malignant carcinoid tumor of unspecified site: Secondary | ICD-10-CM

## 2019-12-31 DIAGNOSIS — C7B02 Secondary carcinoid tumors of liver: Secondary | ICD-10-CM | POA: Diagnosis present

## 2019-12-31 DIAGNOSIS — Z227 Latent tuberculosis: Secondary | ICD-10-CM | POA: Diagnosis not present

## 2019-12-31 DIAGNOSIS — Z5181 Encounter for therapeutic drug level monitoring: Secondary | ICD-10-CM | POA: Diagnosis not present

## 2019-12-31 LAB — HEPATIC FUNCTION PANEL
AG Ratio: 1.6 (calc) (ref 1.0–2.5)
ALT: 29 U/L (ref 9–46)
AST: 29 U/L (ref 10–35)
Albumin: 4.1 g/dL (ref 3.6–5.1)
Alkaline phosphatase (APISO): 64 U/L (ref 35–144)
Bilirubin, Direct: 0.1 mg/dL (ref 0.0–0.2)
Globulin: 2.5 g/dL (calc) (ref 1.9–3.7)
Indirect Bilirubin: 0.5 mg/dL (calc) (ref 0.2–1.2)
Total Bilirubin: 0.6 mg/dL (ref 0.2–1.2)
Total Protein: 6.6 g/dL (ref 6.1–8.1)

## 2019-12-31 MED ORDER — OCTREOTIDE ACETATE 30 MG IM KIT
30.0000 mg | PACK | Freq: Once | INTRAMUSCULAR | Status: AC
  Administered 2019-12-31: 30 mg via INTRAMUSCULAR
  Filled 2019-12-31: qty 1

## 2019-12-31 NOTE — Progress Notes (Signed)
   Subjective:    Patient ID: Burman Riis Pequignot, male    DOB: 05-15-1941, 79 y.o.   MRN: [id number]  HPI Here for follow up of latent Tb No new issues.  He has completed about 7 months of INH + B6 and tolerating.   Plan to complete 9 months.   Review of Systems  Constitutional: Negative for fatigue, fever and unexpected weight change.  Gastrointestinal: Negative for diarrhea.  Skin: Negative for rash.       Objective:   Physical Exam Constitutional:      Appearance: Normal appearance.  Eyes:     General: No scleral icterus. Cardiovascular:     Rate and Rhythm: Normal rate and regular rhythm.     Heart sounds: No murmur heard.   Pulmonary:     Effort: Pulmonary effort is normal. No respiratory distress.     Breath sounds: Normal breath sounds.  Skin:    Findings: No rash.  Neurological:     General: No focal deficit present.     Mental Status: He is alert.           Assessment & Plan:

## 2019-12-31 NOTE — Assessment & Plan Note (Signed)
Has completed about 7 of 9 months and will continue until then. He can follow up as needed.

## 2019-12-31 NOTE — Progress Notes (Signed)
Allen Davenport presents today for injection per the provider's orders.  Sandostatin administration without incident; injection site WNL; see MAR for injection details.  Patient tolerated procedure well and without incident.  No questions or complaints noted at this time. Pt d/c clinic ambulatory. No issues.

## 2019-12-31 NOTE — Assessment & Plan Note (Signed)
Will check his liver enzymes today and if ok, no recheck needed.

## 2020-01-01 ENCOUNTER — Ambulatory Visit (HOSPITAL_COMMUNITY): Payer: Medicare Other

## 2020-01-02 DIAGNOSIS — I10 Essential (primary) hypertension: Secondary | ICD-10-CM | POA: Diagnosis not present

## 2020-01-02 DIAGNOSIS — E7849 Other hyperlipidemia: Secondary | ICD-10-CM | POA: Diagnosis not present

## 2020-01-02 DIAGNOSIS — N185 Chronic kidney disease, stage 5: Secondary | ICD-10-CM | POA: Diagnosis not present

## 2020-01-02 DIAGNOSIS — M818 Other osteoporosis without current pathological fracture: Secondary | ICD-10-CM | POA: Diagnosis not present

## 2020-01-15 DIAGNOSIS — N186 End stage renal disease: Secondary | ICD-10-CM | POA: Diagnosis not present

## 2020-01-15 DIAGNOSIS — N2581 Secondary hyperparathyroidism of renal origin: Secondary | ICD-10-CM | POA: Diagnosis not present

## 2020-01-15 DIAGNOSIS — Z992 Dependence on renal dialysis: Secondary | ICD-10-CM | POA: Diagnosis not present

## 2020-01-15 DIAGNOSIS — D631 Anemia in chronic kidney disease: Secondary | ICD-10-CM | POA: Diagnosis not present

## 2020-01-15 DIAGNOSIS — D509 Iron deficiency anemia, unspecified: Secondary | ICD-10-CM | POA: Diagnosis not present

## 2020-01-17 DIAGNOSIS — D631 Anemia in chronic kidney disease: Secondary | ICD-10-CM | POA: Diagnosis not present

## 2020-01-17 DIAGNOSIS — D509 Iron deficiency anemia, unspecified: Secondary | ICD-10-CM | POA: Diagnosis not present

## 2020-01-17 DIAGNOSIS — N2581 Secondary hyperparathyroidism of renal origin: Secondary | ICD-10-CM | POA: Diagnosis not present

## 2020-01-17 DIAGNOSIS — Z992 Dependence on renal dialysis: Secondary | ICD-10-CM | POA: Diagnosis not present

## 2020-01-17 DIAGNOSIS — N186 End stage renal disease: Secondary | ICD-10-CM | POA: Diagnosis not present

## 2020-01-20 DIAGNOSIS — Z992 Dependence on renal dialysis: Secondary | ICD-10-CM | POA: Diagnosis not present

## 2020-01-20 DIAGNOSIS — D631 Anemia in chronic kidney disease: Secondary | ICD-10-CM | POA: Diagnosis not present

## 2020-01-20 DIAGNOSIS — N2581 Secondary hyperparathyroidism of renal origin: Secondary | ICD-10-CM | POA: Diagnosis not present

## 2020-01-20 DIAGNOSIS — D509 Iron deficiency anemia, unspecified: Secondary | ICD-10-CM | POA: Diagnosis not present

## 2020-01-20 DIAGNOSIS — N186 End stage renal disease: Secondary | ICD-10-CM | POA: Diagnosis not present

## 2020-01-22 DIAGNOSIS — Z992 Dependence on renal dialysis: Secondary | ICD-10-CM | POA: Diagnosis not present

## 2020-01-22 DIAGNOSIS — N186 End stage renal disease: Secondary | ICD-10-CM | POA: Diagnosis not present

## 2020-01-22 DIAGNOSIS — D509 Iron deficiency anemia, unspecified: Secondary | ICD-10-CM | POA: Diagnosis not present

## 2020-01-22 DIAGNOSIS — N2581 Secondary hyperparathyroidism of renal origin: Secondary | ICD-10-CM | POA: Diagnosis not present

## 2020-01-22 DIAGNOSIS — D631 Anemia in chronic kidney disease: Secondary | ICD-10-CM | POA: Diagnosis not present

## 2020-01-24 DIAGNOSIS — Z992 Dependence on renal dialysis: Secondary | ICD-10-CM | POA: Diagnosis not present

## 2020-01-24 DIAGNOSIS — D631 Anemia in chronic kidney disease: Secondary | ICD-10-CM | POA: Diagnosis not present

## 2020-01-24 DIAGNOSIS — N186 End stage renal disease: Secondary | ICD-10-CM | POA: Diagnosis not present

## 2020-01-24 DIAGNOSIS — N2581 Secondary hyperparathyroidism of renal origin: Secondary | ICD-10-CM | POA: Diagnosis not present

## 2020-01-24 DIAGNOSIS — D509 Iron deficiency anemia, unspecified: Secondary | ICD-10-CM | POA: Diagnosis not present

## 2020-01-27 DIAGNOSIS — N186 End stage renal disease: Secondary | ICD-10-CM | POA: Diagnosis not present

## 2020-01-27 DIAGNOSIS — Z992 Dependence on renal dialysis: Secondary | ICD-10-CM | POA: Diagnosis not present

## 2020-01-27 DIAGNOSIS — N2581 Secondary hyperparathyroidism of renal origin: Secondary | ICD-10-CM | POA: Diagnosis not present

## 2020-01-27 DIAGNOSIS — D509 Iron deficiency anemia, unspecified: Secondary | ICD-10-CM | POA: Diagnosis not present

## 2020-01-27 DIAGNOSIS — D631 Anemia in chronic kidney disease: Secondary | ICD-10-CM | POA: Diagnosis not present

## 2020-01-29 ENCOUNTER — Ambulatory Visit (HOSPITAL_COMMUNITY): Payer: Medicare Other | Admitting: Hematology

## 2020-01-29 ENCOUNTER — Ambulatory Visit (HOSPITAL_COMMUNITY): Payer: Medicare Other

## 2020-01-29 ENCOUNTER — Other Ambulatory Visit (HOSPITAL_COMMUNITY): Payer: Medicare Other

## 2020-01-29 DIAGNOSIS — I10 Essential (primary) hypertension: Secondary | ICD-10-CM | POA: Diagnosis not present

## 2020-01-29 DIAGNOSIS — E7849 Other hyperlipidemia: Secondary | ICD-10-CM | POA: Diagnosis not present

## 2020-01-29 DIAGNOSIS — N185 Chronic kidney disease, stage 5: Secondary | ICD-10-CM | POA: Diagnosis not present

## 2020-01-29 DIAGNOSIS — M818 Other osteoporosis without current pathological fracture: Secondary | ICD-10-CM | POA: Diagnosis not present

## 2020-01-30 DIAGNOSIS — Z992 Dependence on renal dialysis: Secondary | ICD-10-CM | POA: Diagnosis not present

## 2020-01-30 DIAGNOSIS — N186 End stage renal disease: Secondary | ICD-10-CM | POA: Diagnosis not present

## 2020-01-30 DIAGNOSIS — N2581 Secondary hyperparathyroidism of renal origin: Secondary | ICD-10-CM | POA: Diagnosis not present

## 2020-01-30 DIAGNOSIS — D631 Anemia in chronic kidney disease: Secondary | ICD-10-CM | POA: Diagnosis not present

## 2020-01-30 DIAGNOSIS — D509 Iron deficiency anemia, unspecified: Secondary | ICD-10-CM | POA: Diagnosis not present

## 2020-01-31 DIAGNOSIS — N186 End stage renal disease: Secondary | ICD-10-CM | POA: Diagnosis not present

## 2020-01-31 DIAGNOSIS — N2581 Secondary hyperparathyroidism of renal origin: Secondary | ICD-10-CM | POA: Diagnosis not present

## 2020-01-31 DIAGNOSIS — Z992 Dependence on renal dialysis: Secondary | ICD-10-CM | POA: Diagnosis not present

## 2020-01-31 DIAGNOSIS — D509 Iron deficiency anemia, unspecified: Secondary | ICD-10-CM | POA: Diagnosis not present

## 2020-01-31 DIAGNOSIS — D631 Anemia in chronic kidney disease: Secondary | ICD-10-CM | POA: Diagnosis not present

## 2020-02-02 ENCOUNTER — Inpatient Hospital Stay (HOSPITAL_COMMUNITY): Payer: Medicare Other

## 2020-02-02 ENCOUNTER — Other Ambulatory Visit: Payer: Self-pay

## 2020-02-02 ENCOUNTER — Inpatient Hospital Stay (HOSPITAL_COMMUNITY): Payer: Medicare Other | Attending: Hematology

## 2020-02-02 ENCOUNTER — Inpatient Hospital Stay (HOSPITAL_BASED_OUTPATIENT_CLINIC_OR_DEPARTMENT_OTHER): Payer: Medicare Other | Admitting: Hematology

## 2020-02-02 VITALS — BP 119/69 | HR 56 | Temp 96.9°F | Resp 18 | Wt 202.5 lb

## 2020-02-02 DIAGNOSIS — C7B04 Secondary carcinoid tumors of peritoneum: Secondary | ICD-10-CM | POA: Diagnosis not present

## 2020-02-02 DIAGNOSIS — N186 End stage renal disease: Secondary | ICD-10-CM

## 2020-02-02 DIAGNOSIS — C7A019 Malignant carcinoid tumor of the small intestine, unspecified portion: Secondary | ICD-10-CM

## 2020-02-02 DIAGNOSIS — C7B02 Secondary carcinoid tumors of liver: Secondary | ICD-10-CM

## 2020-02-02 DIAGNOSIS — C7A00 Malignant carcinoid tumor of unspecified site: Secondary | ICD-10-CM | POA: Diagnosis not present

## 2020-02-02 DIAGNOSIS — D509 Iron deficiency anemia, unspecified: Secondary | ICD-10-CM

## 2020-02-02 DIAGNOSIS — Z992 Dependence on renal dialysis: Secondary | ICD-10-CM

## 2020-02-02 DIAGNOSIS — D631 Anemia in chronic kidney disease: Secondary | ICD-10-CM

## 2020-02-02 LAB — COMPREHENSIVE METABOLIC PANEL
ALT: 10 U/L (ref 0–44)
AST: 28 U/L (ref 15–41)
Albumin: 3.4 g/dL — ABNORMAL LOW (ref 3.5–5.0)
Alkaline Phosphatase: 63 U/L (ref 38–126)
Anion gap: 12 (ref 5–15)
BUN: 46 mg/dL — ABNORMAL HIGH (ref 8–23)
CO2: 28 mmol/L (ref 22–32)
Calcium: 7.8 mg/dL — ABNORMAL LOW (ref 8.9–10.3)
Chloride: 98 mmol/L (ref 98–111)
Creatinine, Ser: 7.15 mg/dL — ABNORMAL HIGH (ref 0.61–1.24)
GFR calc Af Amer: 8 mL/min — ABNORMAL LOW (ref >60)
GFR calc non Af Amer: 7 mL/min — ABNORMAL LOW (ref >60)
Glucose, Bld: 109 mg/dL — ABNORMAL HIGH (ref 70–99)
Potassium: 4 mmol/L (ref 3.5–5.1)
Sodium: 138 mmol/L (ref 135–145)
Total Bilirubin: 0.7 mg/dL (ref 0.3–1.2)
Total Protein: 6.1 g/dL — ABNORMAL LOW (ref 6.5–8.1)

## 2020-02-02 LAB — CBC WITH DIFFERENTIAL/PLATELET
Abs Immature Granulocytes: 0.01 10*3/uL (ref 0.00–0.07)
Basophils Absolute: 0 10*3/uL (ref 0.0–0.1)
Basophils Relative: 0 %
Eosinophils Absolute: 0.1 10*3/uL (ref 0.0–0.5)
Eosinophils Relative: 2 %
HCT: 32.9 % — ABNORMAL LOW (ref 39.0–52.0)
Hemoglobin: 10.6 g/dL — ABNORMAL LOW (ref 13.0–17.0)
Immature Granulocytes: 0 %
Lymphocytes Relative: 10 %
Lymphs Abs: 0.5 10*3/uL — ABNORMAL LOW (ref 0.7–4.0)
MCH: 29.8 pg (ref 26.0–34.0)
MCHC: 32.2 g/dL (ref 30.0–36.0)
MCV: 92.4 fL (ref 80.0–100.0)
Monocytes Absolute: 0.5 10*3/uL (ref 0.1–1.0)
Monocytes Relative: 10 %
Neutro Abs: 3.4 10*3/uL (ref 1.7–7.7)
Neutrophils Relative %: 78 %
Platelets: 125 10*3/uL — ABNORMAL LOW (ref 150–400)
RBC: 3.56 MIL/uL — ABNORMAL LOW (ref 4.22–5.81)
RDW: 13.5 % (ref 11.5–15.5)
WBC: 4.5 10*3/uL (ref 4.0–10.5)
nRBC: 0 % (ref 0.0–0.2)

## 2020-02-02 LAB — LACTATE DEHYDROGENASE: LDH: 153 U/L (ref 98–192)

## 2020-02-02 MED ORDER — OCTREOTIDE ACETATE 30 MG IM KIT
PACK | INTRAMUSCULAR | Status: AC
  Filled 2020-02-02: qty 1

## 2020-02-02 MED ORDER — OCTREOTIDE ACETATE 30 MG IM KIT
30.0000 mg | PACK | Freq: Once | INTRAMUSCULAR | Status: AC
  Administered 2020-02-02: 30 mg via INTRAMUSCULAR

## 2020-02-02 NOTE — Patient Instructions (Addendum)
Allen Davenport at [facility] Discharge Instructions  You were seen today by Dr. Delton Coombes. He went over your recent results. You received your shot today. You will receive your next shot in 4 weeks. Dr. Delton Coombes will see you back in 8 weeks for labs and follow up.   Thank you for choosing [place] at [facility] to provide your oncology and hematology care.  To afford each patient quality time with our provider, please arrive at least 15 minutes before your scheduled appointment time.   If you have a lab appointment with the [facility] please come in thru the Main Entrance and check in at the main information desk  You need to re-schedule your appointment should you arrive 10 or more minutes late.  We strive to give you quality time with our providers, and arriving late affects you and other patients whose appointments are after yours.  Also, if you no show three or more times for appointments you may be dismissed from the clinic at the providers discretion.     Again, thank you for choosing [facility].  Our hope is that these requests will decrease the amount of time that you wait before being seen by our physicians.       _____________________________________________________________  Should you have questions after your visit to [facility], please contact our office at [phone] between the hours of 8:00 a.m. and 4:30 p.m.  Voicemails left after 4:00 p.m. will not be returned until the following business day.  For prescription refill requests, have your pharmacy contact our office and allow 72 hours.    Cancer Center Support Programs:   > Cancer Support Group  2nd Tuesday of the month 1pm-2pm, Journey Room

## 2020-02-02 NOTE — Progress Notes (Signed)
Allen Davenport, [place] [address]   CLINIC:  Medical Oncology/Hematology  PCP:  [name], MD [place] / [place] [address] [phone]   REASON FOR VISIT:  Follow-up for metastatic carcinoid tumor  PRIOR THERAPY: None  NGS Results: Not done  CURRENT THERAPY: Everolimus daily and Sandostatin monthly  BRIEF ONCOLOGIC HISTORY:  Oncology History   No history exists.    CANCER STAGING: Cancer Staging No matching staging information was found for the patient.  INTERVAL HISTORY:  [name], a 79 y.o. male, returns for routine follow-up of his metastatic carcinoid tumor. [name] was last seen on 12/03/2019.  Today he reports taking the everolimus daily and is tolerating it well. He denies having any diarrhea, flushing, F/C or any recent infections.   REVIEW OF SYSTEMS:  Review of Systems  Constitutional: Positive for appetite change (mildly decreased) and fatigue (mild). Negative for chills and fever.  Gastrointestinal: Negative for diarrhea.  Skin: Negative for rash and wound.  All other systems reviewed and are negative.   PAST MEDICAL/SURGICAL HISTORY:  Past Medical History:  Diagnosis Date  . Anemia   . Cancer [facility])    right renal . Prostate- Radiation treatment  . CHF (congestive heart failure) ([place])   . Chronic kidney disease    Dialysis T/TH/Sa  . Edema   . Heart murmur    "nothing to worry about"  . Hyperlipidemia   . Hypertension   . Malignant carcinoid tumor ([place]) 01/03/2016  . Pneumonia    as a child   Past Surgical History:  Procedure Laterality Date  . AV FISTULA PLACEMENT Left 10/20/2014   Procedure: Left Arm ARTERIOVENOUS (AV) FISTULA CREATION;  Surgeon: Elam Dutch, MD;  Location: [place];  Service: Vascular;  Laterality: Left;  . NEPHRECTOMY Right 2010  . PERIPHERAL VASCULAR BALLOON ANGIOPLASTY  06/20/2019   Procedure: PERIPHERAL VASCULAR BALLOON ANGIOPLASTY;  Surgeon: Elam Dutch, MD;  Location: [place] [facility];  Service: Cardiovascular;;  . REVISON OF ARTERIOVENOUS FISTULA Left 08/04/2019   Procedure: REVISON OF ARTERIOVENOUS FISTULA WITH SIDE BRANCH LIGATION;  Surgeon: Elam Dutch, MD;  Location: [facility] OR;  Service: Vascular;  Laterality: Left;    SOCIAL HISTORY:  Social History   Socioeconomic History  . Marital status: Married    Spouse name: Not on file  . Number of children: Not on file  . Years of education: Not on file  . Highest education level: Not on file  Occupational History  . Not on file  Tobacco Use  . Smoking status: Never Smoker  . Smokeless tobacco: Never Used  Vaping Use  . Vaping Use: Never used  Substance and Sexual Activity  . Alcohol use: No    Alcohol/week: 0.0 standard drinks  . Drug use: No  . Sexual activity: Not on file  Other Topics Concern  . Not on file  Social History Narrative  . Not on file   Social Determinants of Health   Financial Resource Strain:   . Difficulty of Paying Living Expenses:   Food Insecurity:   . Worried About Charity fundraiser in the Last Year:   . Arboriculturist in the Last Year:   Transportation Needs:   . Film/video editor (Medical):   [name] Lack of Transportation (Non-Medical):   Physical Activity:   . Days of Exercise per Week:   . Minutes of Exercise per Session:  Stress:   . Feeling of Stress :   Social Connections:   . Frequency of Communication with Friends and Family:   . Frequency of Social Gatherings with Friends and Family:   . Attends Religious Services:   . Active Member of Clubs or Organizations:   . Attends Archivist Meetings:   [name] Marital Status:   Intimate Partner Violence:   . Fear of Current or Ex-Partner:   . Emotionally Abused:   [name] Physically Abused:   . Sexually Abused:     FAMILY HISTORY:  Family History  Problem Relation Age of Onset  . Cancer Mother   . Hypertension Father   . Cancer Sister     CURRENT MEDICATIONS:    Current Outpatient Medications  Medication Sig Dispense Refill  . alendronate (FOSAMAX) 70 MG tablet Take 70 mg by mouth every Sunday.     [name] amLODipine (NORVASC) 10 MG tablet Take 10 mg by mouth daily.    [name] atorvastatin (LIPITOR) 80 MG tablet Take 80 mg by mouth daily at 6 PM.     . calcitRIOL (ROCALTROL) 0.25 MCG capsule Take 0.25 mcg by mouth daily.     . carvedilol (COREG) 3.125 MG tablet Take 3.125 mg by mouth 2 (two) times daily with a meal.     . everolimus (AFINITOR) 5 MG tablet Take 1 tablet (5 mg total) by mouth daily. 28 tablet 2  . furosemide (LASIX) 40 MG tablet Take 40 mg by mouth every morning.    . iron polysaccharides (NIFEREX) 150 MG capsule Take 150 mg by mouth daily.     [name] isoniazid (NYDRAZID) 300 MG tablet Take 1 tablet (300 mg total) by mouth daily. 30 tablet 8  . metolazone (ZAROXOLYN) 5 MG tablet Take 5 mg by mouth every other day.    [name] octreotide (SANDOSTATIN LAR DEPOT) 30 MG injection Inject 30 mg into the muscle every 28 (twenty-eight) days.     . Omega-3 Fatty Acids (FISH OIL) 1000 MG CAPS Take 1,000 mg by mouth daily.     [name] pyridOXINE (B-6) 50 MG tablet Take 1 tablet (50 mg total) by mouth daily. 30 tablet 8  . sodium bicarbonate 650 MG tablet Take 650 mg by mouth daily.    . tamsulosin (FLOMAX) 0.4 MG CAPS capsule Take 0.4 mg by mouth daily.      No current facility-administered medications for this visit.    ALLERGIES:  No Known Allergies  PHYSICAL EXAM:  Performance status (ECOG): 1 - Symptomatic but completely ambulatory  Vitals:   02/02/20 1123  BP: 119/69  Pulse: (!) 56  Resp: 18  Temp: (!) 96.9 F (36.1 C)  SpO2: 96%   Wt Readings from Last 3 Encounters:  02/02/20 202 lb 8 oz (91.9 kg)  12/31/19 192 lb 9.6 oz (87.4 kg)  12/03/19 196 lb 3 oz (89 kg)   Physical Exam Vitals reviewed.  Constitutional:      Appearance: Normal appearance.  Cardiovascular:     Rate and Rhythm: Normal rate and regular rhythm.     Heart sounds: Normal heart  sounds.  Pulmonary:     Effort: Pulmonary effort is normal.     Breath sounds: Normal breath sounds.  Neurological:     General: No focal deficit present.     Mental Status: He is alert and oriented to person, place, and time.  Psychiatric:        Mood and Affect: Mood normal.  Behavior: Behavior normal.      LABORATORY DATA:  I have reviewed the labs as listed.  CBC Latest Ref Rng & Units 02/02/2020 12/03/2019 11/05/2019  WBC 4.0 - 10.5 K/uL 4.5 4.8 4.2  Hemoglobin 13.0 - 17.0 g/dL 10.6(L) 10.3(L) 10.7(L)  Hematocrit 39 - 52 % 32.9(L) 32.4(L) 33.7(L)  Platelets 150 - 400 K/uL 125(L) 163 138(L)   CMP Latest Ref Rng & Units 02/02/2020 12/31/2019 12/03/2019  Glucose 70 - 99 mg/dL 109(H) - 101(H)  BUN 8 - 23 mg/dL 46(H) - 36(H)  Creatinine 0.61 - 1.24 mg/dL 7.15(H) - 6.30(H)  Sodium 135 - 145 mmol/L 138 - 137  Potassium 3.5 - 5.1 mmol/L 4.0 - 3.6  Chloride 98 - 111 mmol/L 98 - 97(L)  CO2 22 - 32 mmol/L 28 - 26  Calcium 8.9 - 10.3 mg/dL 7.8(L) - 8.4(L)  Total Protein 6.5 - 8.1 g/dL 6.1(L) 6.6 6.8  Total Bilirubin 0.3 - 1.2 mg/dL 0.7 0.6 0.6  Alkaline Phos 38 - 126 U/L 63 - 69  AST 15 - 41 U/L 28 29 29   ALT 0 - 44 U/L 10 29 11    Lab Results  Component Value Date   LDH 153 02/02/2020   LDH 162 12/03/2019   LDH 170 11/05/2019     DIAGNOSTIC IMAGING:  I have independently reviewed the scans and discussed with the patient. No results found.   ASSESSMENT:  1.  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  Everolimus 5 mg daily started on 05/17/2018. -PET scan on 10/29/2019 showed multifocal well-differentiated neuroendocrine tumor with intense avid lesions in the liver, mesentery, peritoneal space.  No clear increase in size of the lesions.  Many lesions have increased in radiotracer activity.   2.  Normocytic anemia: -This is from combination of end-stage renal disease and relative iron deficiency and myelosuppression  from everolimus.  3.  ESRD on HD: -Started on HD on 05/20/2019.   PLAN:  1.  Malignant carcinoid tumor to the liver and peritoneum: -I have reviewed his CBC today.  White count is 4.5 with normal ANC.  Platelets are mildly low at 125.  LFTs are grossly normal. -He will continue monthly Sandostatin.  He will continue everolimus 5 mg daily.  Chromogranin was elevated at 600 on 12/03/2019.  However this has been stable. -RTC 6 weeks with labs.  2.  Normocytic anemia: -He will receive Retacrit at hemodialysis.  3.  ESRD on HD: -Continue hemodialysis on Tuesday, Thursday and Saturday.    Orders placed this encounter:  No orders of the defined types were placed in this encounter.    Derek Jack, MD [place] [phone]   I, Milinda Antis, am acting as a scribe for Dr. Sanda Linger.  I, Derek Jack MD, have reviewed the above documentation for accuracy and completeness, and I agree with the above.

## 2020-02-02 NOTE — Progress Notes (Signed)
Allen Davenport presents today for injection per the provider's orders.  Sandostatin administration without incident; injection site WNL; see MAR for injection details.  Patient tolerated procedure well and without incident.  No questions or complaints noted at this time. Pt d/c clinic ambulatory.

## 2020-02-03 DIAGNOSIS — N2581 Secondary hyperparathyroidism of renal origin: Secondary | ICD-10-CM | POA: Diagnosis not present

## 2020-02-03 DIAGNOSIS — N186 End stage renal disease: Secondary | ICD-10-CM | POA: Diagnosis not present

## 2020-02-03 DIAGNOSIS — D509 Iron deficiency anemia, unspecified: Secondary | ICD-10-CM | POA: Diagnosis not present

## 2020-02-03 DIAGNOSIS — Z992 Dependence on renal dialysis: Secondary | ICD-10-CM | POA: Diagnosis not present

## 2020-02-03 DIAGNOSIS — D631 Anemia in chronic kidney disease: Secondary | ICD-10-CM | POA: Diagnosis not present

## 2020-02-04 ENCOUNTER — Other Ambulatory Visit (HOSPITAL_COMMUNITY): Payer: Self-pay

## 2020-02-04 DIAGNOSIS — C7A019 Malignant carcinoid tumor of the small intestine, unspecified portion: Secondary | ICD-10-CM

## 2020-02-04 MED ORDER — EVEROLIMUS 5 MG PO TABS: 5.0000 mg | ORAL_TABLET | Freq: Every day | ORAL | 2 refills | Status: DC

## 2020-02-05 DIAGNOSIS — Z992 Dependence on renal dialysis: Secondary | ICD-10-CM | POA: Diagnosis not present

## 2020-02-05 DIAGNOSIS — D509 Iron deficiency anemia, unspecified: Secondary | ICD-10-CM | POA: Diagnosis not present

## 2020-02-05 DIAGNOSIS — N186 End stage renal disease: Secondary | ICD-10-CM | POA: Diagnosis not present

## 2020-02-05 DIAGNOSIS — N2581 Secondary hyperparathyroidism of renal origin: Secondary | ICD-10-CM | POA: Diagnosis not present

## 2020-02-05 DIAGNOSIS — D631 Anemia in chronic kidney disease: Secondary | ICD-10-CM | POA: Diagnosis not present

## 2020-02-07 DIAGNOSIS — Z992 Dependence on renal dialysis: Secondary | ICD-10-CM | POA: Diagnosis not present

## 2020-02-07 DIAGNOSIS — N2581 Secondary hyperparathyroidism of renal origin: Secondary | ICD-10-CM | POA: Diagnosis not present

## 2020-02-07 DIAGNOSIS — N186 End stage renal disease: Secondary | ICD-10-CM | POA: Diagnosis not present

## 2020-02-07 DIAGNOSIS — D509 Iron deficiency anemia, unspecified: Secondary | ICD-10-CM | POA: Diagnosis not present

## 2020-02-07 DIAGNOSIS — D631 Anemia in chronic kidney disease: Secondary | ICD-10-CM | POA: Diagnosis not present

## 2020-02-10 DIAGNOSIS — D509 Iron deficiency anemia, unspecified: Secondary | ICD-10-CM | POA: Diagnosis not present

## 2020-02-10 DIAGNOSIS — Z992 Dependence on renal dialysis: Secondary | ICD-10-CM | POA: Diagnosis not present

## 2020-02-10 DIAGNOSIS — D631 Anemia in chronic kidney disease: Secondary | ICD-10-CM | POA: Diagnosis not present

## 2020-02-10 DIAGNOSIS — N2581 Secondary hyperparathyroidism of renal origin: Secondary | ICD-10-CM | POA: Diagnosis not present

## 2020-02-10 DIAGNOSIS — N186 End stage renal disease: Secondary | ICD-10-CM | POA: Diagnosis not present

## 2020-02-12 DIAGNOSIS — N2581 Secondary hyperparathyroidism of renal origin: Secondary | ICD-10-CM | POA: Diagnosis not present

## 2020-02-12 DIAGNOSIS — D509 Iron deficiency anemia, unspecified: Secondary | ICD-10-CM | POA: Diagnosis not present

## 2020-02-12 DIAGNOSIS — N186 End stage renal disease: Secondary | ICD-10-CM | POA: Diagnosis not present

## 2020-02-12 DIAGNOSIS — D631 Anemia in chronic kidney disease: Secondary | ICD-10-CM | POA: Diagnosis not present

## 2020-02-12 DIAGNOSIS — Z992 Dependence on renal dialysis: Secondary | ICD-10-CM | POA: Diagnosis not present

## 2020-02-13 ENCOUNTER — Other Ambulatory Visit: Payer: Self-pay | Admitting: Internal Medicine

## 2020-02-14 DIAGNOSIS — Z992 Dependence on renal dialysis: Secondary | ICD-10-CM | POA: Diagnosis not present

## 2020-02-14 DIAGNOSIS — N2581 Secondary hyperparathyroidism of renal origin: Secondary | ICD-10-CM | POA: Diagnosis not present

## 2020-02-14 DIAGNOSIS — D509 Iron deficiency anemia, unspecified: Secondary | ICD-10-CM | POA: Diagnosis not present

## 2020-02-14 DIAGNOSIS — D631 Anemia in chronic kidney disease: Secondary | ICD-10-CM | POA: Diagnosis not present

## 2020-02-14 DIAGNOSIS — N186 End stage renal disease: Secondary | ICD-10-CM | POA: Diagnosis not present

## 2020-02-16 DIAGNOSIS — H11133 Conjunctival pigmentations, bilateral: Secondary | ICD-10-CM | POA: Diagnosis not present

## 2020-02-17 DIAGNOSIS — N186 End stage renal disease: Secondary | ICD-10-CM | POA: Diagnosis not present

## 2020-02-17 DIAGNOSIS — D509 Iron deficiency anemia, unspecified: Secondary | ICD-10-CM | POA: Diagnosis not present

## 2020-02-17 DIAGNOSIS — Z992 Dependence on renal dialysis: Secondary | ICD-10-CM | POA: Diagnosis not present

## 2020-02-17 DIAGNOSIS — D631 Anemia in chronic kidney disease: Secondary | ICD-10-CM | POA: Diagnosis not present

## 2020-02-19 DIAGNOSIS — D509 Iron deficiency anemia, unspecified: Secondary | ICD-10-CM | POA: Diagnosis not present

## 2020-02-19 DIAGNOSIS — D631 Anemia in chronic kidney disease: Secondary | ICD-10-CM | POA: Diagnosis not present

## 2020-02-19 DIAGNOSIS — N186 End stage renal disease: Secondary | ICD-10-CM | POA: Diagnosis not present

## 2020-02-19 DIAGNOSIS — Z992 Dependence on renal dialysis: Secondary | ICD-10-CM | POA: Diagnosis not present

## 2020-02-21 DIAGNOSIS — D631 Anemia in chronic kidney disease: Secondary | ICD-10-CM | POA: Diagnosis not present

## 2020-02-21 DIAGNOSIS — N186 End stage renal disease: Secondary | ICD-10-CM | POA: Diagnosis not present

## 2020-02-21 DIAGNOSIS — D509 Iron deficiency anemia, unspecified: Secondary | ICD-10-CM | POA: Diagnosis not present

## 2020-02-21 DIAGNOSIS — Z992 Dependence on renal dialysis: Secondary | ICD-10-CM | POA: Diagnosis not present

## 2020-02-24 DIAGNOSIS — N186 End stage renal disease: Secondary | ICD-10-CM | POA: Diagnosis not present

## 2020-02-24 DIAGNOSIS — Z992 Dependence on renal dialysis: Secondary | ICD-10-CM | POA: Diagnosis not present

## 2020-02-24 DIAGNOSIS — D631 Anemia in chronic kidney disease: Secondary | ICD-10-CM | POA: Diagnosis not present

## 2020-02-24 DIAGNOSIS — D509 Iron deficiency anemia, unspecified: Secondary | ICD-10-CM | POA: Diagnosis not present

## 2020-02-26 DIAGNOSIS — E7849 Other hyperlipidemia: Secondary | ICD-10-CM | POA: Diagnosis not present

## 2020-02-26 DIAGNOSIS — D631 Anemia in chronic kidney disease: Secondary | ICD-10-CM | POA: Diagnosis not present

## 2020-02-26 DIAGNOSIS — I10 Essential (primary) hypertension: Secondary | ICD-10-CM | POA: Diagnosis not present

## 2020-02-26 DIAGNOSIS — M818 Other osteoporosis without current pathological fracture: Secondary | ICD-10-CM | POA: Diagnosis not present

## 2020-02-26 DIAGNOSIS — D509 Iron deficiency anemia, unspecified: Secondary | ICD-10-CM | POA: Diagnosis not present

## 2020-02-26 DIAGNOSIS — N186 End stage renal disease: Secondary | ICD-10-CM | POA: Diagnosis not present

## 2020-02-26 DIAGNOSIS — Z992 Dependence on renal dialysis: Secondary | ICD-10-CM | POA: Diagnosis not present

## 2020-02-26 DIAGNOSIS — N185 Chronic kidney disease, stage 5: Secondary | ICD-10-CM | POA: Diagnosis not present

## 2020-02-28 DIAGNOSIS — Z992 Dependence on renal dialysis: Secondary | ICD-10-CM | POA: Diagnosis not present

## 2020-02-28 DIAGNOSIS — N186 End stage renal disease: Secondary | ICD-10-CM | POA: Diagnosis not present

## 2020-02-28 DIAGNOSIS — D509 Iron deficiency anemia, unspecified: Secondary | ICD-10-CM | POA: Diagnosis not present

## 2020-02-28 DIAGNOSIS — D631 Anemia in chronic kidney disease: Secondary | ICD-10-CM | POA: Diagnosis not present

## 2020-03-01 ENCOUNTER — Other Ambulatory Visit: Payer: Self-pay

## 2020-03-01 ENCOUNTER — Encounter (HOSPITAL_COMMUNITY): Payer: Self-pay

## 2020-03-01 ENCOUNTER — Other Ambulatory Visit (HOSPITAL_COMMUNITY): Payer: Medicare Other

## 2020-03-01 ENCOUNTER — Inpatient Hospital Stay (HOSPITAL_COMMUNITY): Payer: Medicare Other

## 2020-03-01 ENCOUNTER — Inpatient Hospital Stay (HOSPITAL_COMMUNITY): Payer: Medicare Other | Attending: Hematology

## 2020-03-01 VITALS — BP 100/67 | HR 68 | Temp 97.1°F | Resp 18

## 2020-03-01 DIAGNOSIS — E1151 Type 2 diabetes mellitus with diabetic peripheral angiopathy without gangrene: Secondary | ICD-10-CM | POA: Diagnosis not present

## 2020-03-01 DIAGNOSIS — C7B02 Secondary carcinoid tumors of liver: Secondary | ICD-10-CM | POA: Insufficient documentation

## 2020-03-01 DIAGNOSIS — C7A00 Malignant carcinoid tumor of unspecified site: Secondary | ICD-10-CM | POA: Diagnosis not present

## 2020-03-01 DIAGNOSIS — E1140 Type 2 diabetes mellitus with diabetic neuropathy, unspecified: Secondary | ICD-10-CM | POA: Diagnosis not present

## 2020-03-01 MED ORDER — OCTREOTIDE ACETATE 30 MG IM KIT
30.0000 mg | PACK | Freq: Once | INTRAMUSCULAR | Status: AC
  Administered 2020-03-01: 30 mg via INTRAMUSCULAR

## 2020-03-01 NOTE — Patient Instructions (Signed)
[facility] at [facility] Discharge Instructions  Received Sandostatin injection today. Follow-up as scheduled   Thank you for choosing [facility] at [facility] to provide your oncology and hematology care.  To afford each patient quality time with our provider, please arrive at least 15 minutes before your scheduled appointment time.   If you have a lab appointment with the Cancer Center please come in thru the Main Entrance and check in at the main information desk.  You need to re-schedule your appointment should you arrive 10 or more minutes late.  We strive to give you quality time with our providers, and arriving late affects you and other patients whose appointments are after yours.  Also, if you no show three or more times for appointments you may be dismissed from the clinic at the providers discretion.     Again, thank you for choosing [facility].  Our hope is that these requests will decrease the amount of time that you wait before being seen by our physicians.       _____________________________________________________________  Should you have questions after your visit to [facility], please contact our office at [phone] and follow the prompts.  Our office hours are 8:00 a.m. and 4:30 p.m. Monday - Friday.  Please note that voicemails left after 4:00 p.m. may not be returned until the following business day.  We are closed weekends and major holidays.  You do have access to a nurse 24-7, just call the main number to the clinic [phone] and do not press any options, hold on the line and a nurse will answer the phone.    For prescription refill requests, have your pharmacy contact our office and allow 72 hours.    Due to Covid, you will need to wear a mask upon entering the hospital. If you do not have a mask, a mask will be given to you at the Main Entrance upon arrival. For doctor visits, patients may  have 1 support person age 18 or older with them. For treatment visits, patients can not have anyone with them due to social distancing guidelines and our immunocompromised population.     

## 2020-03-01 NOTE — Progress Notes (Signed)
Allen Davenport tolerated Sandostatin injection well without complaints or incident. VSS Pt discharged self ambulatory in satisfactory condition 

## 2020-03-02 DIAGNOSIS — Z992 Dependence on renal dialysis: Secondary | ICD-10-CM | POA: Diagnosis not present

## 2020-03-02 DIAGNOSIS — N186 End stage renal disease: Secondary | ICD-10-CM | POA: Diagnosis not present

## 2020-03-02 DIAGNOSIS — D631 Anemia in chronic kidney disease: Secondary | ICD-10-CM | POA: Diagnosis not present

## 2020-03-02 DIAGNOSIS — D509 Iron deficiency anemia, unspecified: Secondary | ICD-10-CM | POA: Diagnosis not present

## 2020-03-04 DIAGNOSIS — D631 Anemia in chronic kidney disease: Secondary | ICD-10-CM | POA: Diagnosis not present

## 2020-03-04 DIAGNOSIS — D509 Iron deficiency anemia, unspecified: Secondary | ICD-10-CM | POA: Diagnosis not present

## 2020-03-04 DIAGNOSIS — N186 End stage renal disease: Secondary | ICD-10-CM | POA: Diagnosis not present

## 2020-03-04 DIAGNOSIS — Z992 Dependence on renal dialysis: Secondary | ICD-10-CM | POA: Diagnosis not present

## 2020-03-06 DIAGNOSIS — Z992 Dependence on renal dialysis: Secondary | ICD-10-CM | POA: Diagnosis not present

## 2020-03-06 DIAGNOSIS — D631 Anemia in chronic kidney disease: Secondary | ICD-10-CM | POA: Diagnosis not present

## 2020-03-06 DIAGNOSIS — N186 End stage renal disease: Secondary | ICD-10-CM | POA: Diagnosis not present

## 2020-03-06 DIAGNOSIS — D509 Iron deficiency anemia, unspecified: Secondary | ICD-10-CM | POA: Diagnosis not present

## 2020-03-09 DIAGNOSIS — N186 End stage renal disease: Secondary | ICD-10-CM | POA: Diagnosis not present

## 2020-03-09 DIAGNOSIS — Z992 Dependence on renal dialysis: Secondary | ICD-10-CM | POA: Diagnosis not present

## 2020-03-09 DIAGNOSIS — D631 Anemia in chronic kidney disease: Secondary | ICD-10-CM | POA: Diagnosis not present

## 2020-03-09 DIAGNOSIS — D509 Iron deficiency anemia, unspecified: Secondary | ICD-10-CM | POA: Diagnosis not present

## 2020-03-11 DIAGNOSIS — N186 End stage renal disease: Secondary | ICD-10-CM | POA: Diagnosis not present

## 2020-03-11 DIAGNOSIS — D631 Anemia in chronic kidney disease: Secondary | ICD-10-CM | POA: Diagnosis not present

## 2020-03-11 DIAGNOSIS — D509 Iron deficiency anemia, unspecified: Secondary | ICD-10-CM | POA: Diagnosis not present

## 2020-03-11 DIAGNOSIS — Z992 Dependence on renal dialysis: Secondary | ICD-10-CM | POA: Diagnosis not present

## 2020-03-12 DIAGNOSIS — D509 Iron deficiency anemia, unspecified: Secondary | ICD-10-CM | POA: Diagnosis not present

## 2020-03-12 DIAGNOSIS — Z992 Dependence on renal dialysis: Secondary | ICD-10-CM | POA: Diagnosis not present

## 2020-03-12 DIAGNOSIS — N186 End stage renal disease: Secondary | ICD-10-CM | POA: Diagnosis not present

## 2020-03-12 DIAGNOSIS — D631 Anemia in chronic kidney disease: Secondary | ICD-10-CM | POA: Diagnosis not present

## 2020-03-15 DIAGNOSIS — N185 Chronic kidney disease, stage 5: Secondary | ICD-10-CM | POA: Diagnosis not present

## 2020-03-15 DIAGNOSIS — I10 Essential (primary) hypertension: Secondary | ICD-10-CM | POA: Diagnosis not present

## 2020-03-15 DIAGNOSIS — B079 Viral wart, unspecified: Secondary | ICD-10-CM | POA: Diagnosis not present

## 2020-03-15 DIAGNOSIS — Z1331 Encounter for screening for depression: Secondary | ICD-10-CM | POA: Diagnosis not present

## 2020-03-15 DIAGNOSIS — L821 Other seborrheic keratosis: Secondary | ICD-10-CM | POA: Diagnosis not present

## 2020-03-15 DIAGNOSIS — D485 Neoplasm of uncertain behavior of skin: Secondary | ICD-10-CM | POA: Diagnosis not present

## 2020-03-15 DIAGNOSIS — Z0000 Encounter for general adult medical examination without abnormal findings: Secondary | ICD-10-CM | POA: Diagnosis not present

## 2020-03-15 DIAGNOSIS — E7849 Other hyperlipidemia: Secondary | ICD-10-CM | POA: Diagnosis not present

## 2020-03-15 DIAGNOSIS — M818 Other osteoporosis without current pathological fracture: Secondary | ICD-10-CM | POA: Diagnosis not present

## 2020-03-16 DIAGNOSIS — D509 Iron deficiency anemia, unspecified: Secondary | ICD-10-CM | POA: Diagnosis not present

## 2020-03-16 DIAGNOSIS — N186 End stage renal disease: Secondary | ICD-10-CM | POA: Diagnosis not present

## 2020-03-16 DIAGNOSIS — D631 Anemia in chronic kidney disease: Secondary | ICD-10-CM | POA: Diagnosis not present

## 2020-03-16 DIAGNOSIS — Z992 Dependence on renal dialysis: Secondary | ICD-10-CM | POA: Diagnosis not present

## 2020-03-18 DIAGNOSIS — Z992 Dependence on renal dialysis: Secondary | ICD-10-CM | POA: Diagnosis not present

## 2020-03-18 DIAGNOSIS — D631 Anemia in chronic kidney disease: Secondary | ICD-10-CM | POA: Diagnosis not present

## 2020-03-18 DIAGNOSIS — N186 End stage renal disease: Secondary | ICD-10-CM | POA: Diagnosis not present

## 2020-03-18 DIAGNOSIS — N2581 Secondary hyperparathyroidism of renal origin: Secondary | ICD-10-CM | POA: Diagnosis not present

## 2020-03-18 DIAGNOSIS — D509 Iron deficiency anemia, unspecified: Secondary | ICD-10-CM | POA: Diagnosis not present

## 2020-03-20 DIAGNOSIS — Z992 Dependence on renal dialysis: Secondary | ICD-10-CM | POA: Diagnosis not present

## 2020-03-20 DIAGNOSIS — D509 Iron deficiency anemia, unspecified: Secondary | ICD-10-CM | POA: Diagnosis not present

## 2020-03-20 DIAGNOSIS — N186 End stage renal disease: Secondary | ICD-10-CM | POA: Diagnosis not present

## 2020-03-20 DIAGNOSIS — N2581 Secondary hyperparathyroidism of renal origin: Secondary | ICD-10-CM | POA: Diagnosis not present

## 2020-03-20 DIAGNOSIS — D631 Anemia in chronic kidney disease: Secondary | ICD-10-CM | POA: Diagnosis not present

## 2020-03-23 DIAGNOSIS — D509 Iron deficiency anemia, unspecified: Secondary | ICD-10-CM | POA: Diagnosis not present

## 2020-03-23 DIAGNOSIS — D631 Anemia in chronic kidney disease: Secondary | ICD-10-CM | POA: Diagnosis not present

## 2020-03-23 DIAGNOSIS — Z992 Dependence on renal dialysis: Secondary | ICD-10-CM | POA: Diagnosis not present

## 2020-03-23 DIAGNOSIS — N2581 Secondary hyperparathyroidism of renal origin: Secondary | ICD-10-CM | POA: Diagnosis not present

## 2020-03-23 DIAGNOSIS — N186 End stage renal disease: Secondary | ICD-10-CM | POA: Diagnosis not present

## 2020-03-25 DIAGNOSIS — D631 Anemia in chronic kidney disease: Secondary | ICD-10-CM | POA: Diagnosis not present

## 2020-03-25 DIAGNOSIS — I10 Essential (primary) hypertension: Secondary | ICD-10-CM | POA: Diagnosis not present

## 2020-03-25 DIAGNOSIS — N2581 Secondary hyperparathyroidism of renal origin: Secondary | ICD-10-CM | POA: Diagnosis not present

## 2020-03-25 DIAGNOSIS — N186 End stage renal disease: Secondary | ICD-10-CM | POA: Diagnosis not present

## 2020-03-25 DIAGNOSIS — E7849 Other hyperlipidemia: Secondary | ICD-10-CM | POA: Diagnosis not present

## 2020-03-25 DIAGNOSIS — D509 Iron deficiency anemia, unspecified: Secondary | ICD-10-CM | POA: Diagnosis not present

## 2020-03-25 DIAGNOSIS — M818 Other osteoporosis without current pathological fracture: Secondary | ICD-10-CM | POA: Diagnosis not present

## 2020-03-25 DIAGNOSIS — Z992 Dependence on renal dialysis: Secondary | ICD-10-CM | POA: Diagnosis not present

## 2020-03-25 DIAGNOSIS — N185 Chronic kidney disease, stage 5: Secondary | ICD-10-CM | POA: Diagnosis not present

## 2020-03-27 DIAGNOSIS — N186 End stage renal disease: Secondary | ICD-10-CM | POA: Diagnosis not present

## 2020-03-27 DIAGNOSIS — D509 Iron deficiency anemia, unspecified: Secondary | ICD-10-CM | POA: Diagnosis not present

## 2020-03-27 DIAGNOSIS — Z992 Dependence on renal dialysis: Secondary | ICD-10-CM | POA: Diagnosis not present

## 2020-03-27 DIAGNOSIS — N2581 Secondary hyperparathyroidism of renal origin: Secondary | ICD-10-CM | POA: Diagnosis not present

## 2020-03-27 DIAGNOSIS — D631 Anemia in chronic kidney disease: Secondary | ICD-10-CM | POA: Diagnosis not present

## 2020-03-29 ENCOUNTER — Ambulatory Visit (HOSPITAL_COMMUNITY): Payer: Medicare Other | Admitting: Hematology

## 2020-03-29 ENCOUNTER — Inpatient Hospital Stay (HOSPITAL_COMMUNITY): Payer: Medicare Other | Attending: Hematology

## 2020-03-29 ENCOUNTER — Ambulatory Visit (HOSPITAL_COMMUNITY): Payer: Medicare Other

## 2020-03-29 ENCOUNTER — Inpatient Hospital Stay (HOSPITAL_COMMUNITY): Payer: Medicare Other

## 2020-03-29 ENCOUNTER — Other Ambulatory Visit (HOSPITAL_COMMUNITY): Payer: Medicare Other

## 2020-03-29 ENCOUNTER — Other Ambulatory Visit: Payer: Self-pay

## 2020-03-29 ENCOUNTER — Inpatient Hospital Stay (HOSPITAL_BASED_OUTPATIENT_CLINIC_OR_DEPARTMENT_OTHER): Payer: Medicare Other | Admitting: Hematology

## 2020-03-29 VITALS — BP 104/67 | HR 68 | Temp 97.3°F | Resp 18 | Wt 197.8 lb

## 2020-03-29 DIAGNOSIS — C7B04 Secondary carcinoid tumors of peritoneum: Secondary | ICD-10-CM | POA: Insufficient documentation

## 2020-03-29 DIAGNOSIS — C7A00 Malignant carcinoid tumor of unspecified site: Secondary | ICD-10-CM

## 2020-03-29 DIAGNOSIS — C7B02 Secondary carcinoid tumors of liver: Secondary | ICD-10-CM | POA: Insufficient documentation

## 2020-03-29 DIAGNOSIS — C7A019 Malignant carcinoid tumor of the small intestine, unspecified portion: Secondary | ICD-10-CM

## 2020-03-29 LAB — COMPREHENSIVE METABOLIC PANEL
ALT: 16 U/L (ref 0–44)
AST: 29 U/L (ref 15–41)
Albumin: 3.5 g/dL (ref 3.5–5.0)
Alkaline Phosphatase: 46 U/L (ref 38–126)
Anion gap: 13 (ref 5–15)
BUN: 49 mg/dL — ABNORMAL HIGH (ref 8–23)
CO2: 28 mmol/L (ref 22–32)
Calcium: 8.7 mg/dL — ABNORMAL LOW (ref 8.9–10.3)
Chloride: 93 mmol/L — ABNORMAL LOW (ref 98–111)
Creatinine, Ser: 9.04 mg/dL — ABNORMAL HIGH (ref 0.61–1.24)
GFR calc Af Amer: 6 mL/min — ABNORMAL LOW (ref >60)
GFR calc non Af Amer: 5 mL/min — ABNORMAL LOW (ref >60)
Glucose, Bld: 162 mg/dL — ABNORMAL HIGH (ref 70–99)
Potassium: 3.8 mmol/L (ref 3.5–5.1)
Sodium: 134 mmol/L — ABNORMAL LOW (ref 135–145)
Total Bilirubin: 0.7 mg/dL (ref 0.3–1.2)
Total Protein: 6.2 g/dL — ABNORMAL LOW (ref 6.5–8.1)

## 2020-03-29 LAB — CBC WITH DIFFERENTIAL/PLATELET
Abs Immature Granulocytes: 0.01 10*3/uL (ref 0.00–0.07)
Basophils Absolute: 0 10*3/uL (ref 0.0–0.1)
Basophils Relative: 1 %
Eosinophils Absolute: 0.2 10*3/uL (ref 0.0–0.5)
Eosinophils Relative: 4 %
HCT: 34.8 % — ABNORMAL LOW (ref 39.0–52.0)
Hemoglobin: 11.1 g/dL — ABNORMAL LOW (ref 13.0–17.0)
Immature Granulocytes: 0 %
Lymphocytes Relative: 9 %
Lymphs Abs: 0.5 10*3/uL — ABNORMAL LOW (ref 0.7–4.0)
MCH: 29.1 pg (ref 26.0–34.0)
MCHC: 31.9 g/dL (ref 30.0–36.0)
MCV: 91.3 fL (ref 80.0–100.0)
Monocytes Absolute: 0.5 10*3/uL (ref 0.1–1.0)
Monocytes Relative: 9 %
Neutro Abs: 3.9 10*3/uL (ref 1.7–7.7)
Neutrophils Relative %: 77 %
Platelets: 158 10*3/uL (ref 150–400)
RBC: 3.81 MIL/uL — ABNORMAL LOW (ref 4.22–5.81)
RDW: 13.8 % (ref 11.5–15.5)
WBC: 5 10*3/uL (ref 4.0–10.5)
nRBC: 0 % (ref 0.0–0.2)

## 2020-03-29 MED ORDER — OCTREOTIDE ACETATE 30 MG IM KIT
30.0000 mg | PACK | Freq: Once | INTRAMUSCULAR | Status: AC
  Administered 2020-03-29: 30 mg via INTRAMUSCULAR

## 2020-03-29 NOTE — Progress Notes (Signed)
1153 Labs reviewed with and pt seen by Dr. Delton Coombes and pt approved for Sandostatin injection today per MD                            Allen Davenport tolerated Sandostatin injection well without complaints or incident. Pt discharged self ambulatory in satisfactory condition

## 2020-03-29 NOTE — Progress Notes (Signed)
Patient was assessed by Dr. Katragadda and labs have been reviewed.  Patient is okay to proceed with treatment today. Primary RN and pharmacy aware.   

## 2020-03-29 NOTE — Patient Instructions (Signed)
[facility] at [facility] Discharge Instructions  Received Sandostatin injection today. Follow-up as scheduled   Thank you for choosing  [facility] at [facility] to provide your oncology and hematology care.  To afford each patient quality time with our provider, please arrive at least 15 minutes before your scheduled appointment time.   If you have a lab appointment with the Cancer Center please come in thru the Main Entrance and check in at the main information desk.  You need to re-schedule your appointment should you arrive 10 or more minutes late.  We strive to give you quality time with our providers, and arriving late affects you and other patients whose appointments are after yours.  Also, if you no show three or more times for appointments you may be dismissed from the clinic at the providers discretion.     Again, thank you for choosing [facility].  Our hope is that these requests will decrease the amount of time that you wait before being seen by our physicians.       _____________________________________________________________  Should you have questions after your visit to [facility], please contact our office at [phone] and follow the prompts.  Our office hours are 8:00 a.m. and 4:30 p.m. Monday - Friday.  Please note that voicemails left after 4:00 p.m. may not be returned until the following business day.  We are closed weekends and major holidays.  You do have access to a nurse 24-7, just call the main number to the clinic [phone] and do not press any options, hold on the line and a nurse will answer the phone.    For prescription refill requests, have your pharmacy contact our office and allow 72 hours.    Due to Covid, you will need to wear a mask upon entering the hospital. If you do not have a mask, a mask will be given to you at the Main Entrance upon arrival. For doctor visits, patients may  have 1 support person age 18 or older with them. For treatment visits, patients can not have anyone with them due to social distancing guidelines and our immunocompromised population.     

## 2020-03-29 NOTE — Patient Instructions (Signed)
[place] at [facility] Discharge Instructions  You were seen today by Dr. Delton Coombes. He went over your recent results. You received your injection today; continue receiving your monthly injections. Dr. Delton Coombes will see you back in 2 months for labs and follow up.   Thank you for choosing [place] at [facility] to provide your oncology and hematology care.  To afford each patient quality time with our provider, please arrive at least 15 minutes before your scheduled appointment time.   If you have a lab appointment with the [facility] [place] please come in thru the Main Entrance and check in at the main information desk  You need to re-schedule your appointment should you arrive 10 or more minutes late.  We strive to give you quality time with our providers, and arriving late affects you and other patients whose appointments are after yours.  Also, if you no show three or more times for appointments you may be dismissed from the clinic at the providers discretion.     Again, thank you for choosing [facility].  Our hope is that these requests will decrease the amount of time that you wait before being seen by our physicians.       _____________________________________________________________  Should you have questions after your visit to [facility], please contact our office at [phone] between the hours of 8:00 a.m. and 4:30 p.m.  Voicemails left after 4:00 p.m. will not be returned until the following business day.  For prescription refill requests, have your pharmacy contact our office and allow 72 hours.    Cancer Center Support Programs:   > Cancer Support Group  2nd Tuesday of the month 1pm-2pm, Journey Room

## 2020-03-29 NOTE — Progress Notes (Signed)
Allen Davenport, Allen Davenport   CLINIC:  Medical Oncology/Hematology  PCP:  [name], MD [place] / [place] [id number] [phone]   REASON FOR VISIT:  Follow-up for metastatic carcinoid tumor  PRIOR THERAPY: None  NGS Results: Not done  CURRENT THERAPY: Everolimus daily and Sandostatin monthly  BRIEF ONCOLOGIC HISTORY:  Oncology History   No history exists.    CANCER STAGING: Cancer Staging No matching staging information was found for the patient.  INTERVAL HISTORY:  Allen Davenport, a 79 y.o. male, returns for routine follow-up of his metastatic carcinoid tumor. Allen Davenport was last seen on 02/02/2020.  Today Allen Davenport reports feeling well and tolerating the dialysis and everolimus well; Allen Davenport is also tolerating the Sandostatin injections well. Allen Davenport denies having any skin peeling, abdominal cramping, wheezing, or flushing. Allen Davenport reports having 1 episode of loose stool once a month.   REVIEW OF SYSTEMS:  Review of Systems  Constitutional: Positive for appetite change (mildly decreased) and fatigue (mild).  Respiratory: Negative for wheezing.   Gastrointestinal: Positive for diarrhea (monthly) and nausea. Negative for abdominal pain.  Skin: Negative for itching and rash.  All other systems reviewed and are negative.   PAST MEDICAL/SURGICAL HISTORY:  Past Medical History:  Diagnosis Date  . Anemia   . Cancer [facility])    right renal . Prostate- Radiation treatment  . CHF (congestive heart failure) ([place])   . Chronic kidney disease    Dialysis T/TH/Sa  . Edema   . Heart murmur    "nothing to worry about"  . Hyperlipidemia   . Hypertension   . Malignant carcinoid tumor ([place]) 01/03/2016  . Pneumonia    as a child   Past Surgical History:  Procedure Laterality Date  . AV FISTULA PLACEMENT Left 10/20/2014   Procedure: Left Arm ARTERIOVENOUS (AV) FISTULA CREATION;  Surgeon: Elam Dutch, MD;  Location: [place];  Service:  Vascular;  Laterality: Left;  . NEPHRECTOMY Right 2010  . PERIPHERAL VASCULAR BALLOON ANGIOPLASTY  06/20/2019   Procedure: PERIPHERAL VASCULAR BALLOON ANGIOPLASTY;  Surgeon: Elam Dutch, MD;  Location: [place] CV LAB;  Service: Cardiovascular;;  . REVISON OF ARTERIOVENOUS FISTULA Left 08/04/2019   Procedure: REVISON OF ARTERIOVENOUS FISTULA WITH SIDE BRANCH LIGATION;  Surgeon: Elam Dutch, MD;  Location: [facility] OR;  Service: Vascular;  Laterality: Left;    SOCIAL HISTORY:  Social History   Socioeconomic History  . Marital status: Married    Spouse name: Not on file  . Number of children: Not on file  . Years of education: Not on file  . Highest education level: Not on file  Occupational History  . Not on file  Tobacco Use  . Smoking status: Never Smoker  . Smokeless tobacco: Never Used  Vaping Use  . Vaping Use: Never used  Substance and Sexual Activity  . Alcohol use: No    Alcohol/week: 0.0 standard drinks  . Drug use: No  . Sexual activity: Not on file  Other Topics Concern  . Not on file  Social History Narrative  . Not on file   Social Determinants of Health   Financial Resource Strain:   . Difficulty of Paying Living Expenses: Not on file  Food Insecurity:   . Worried About Charity fundraiser in the Last Year: Not on file  . Ran Out of Food in the Last Year: Not on file  Transportation Needs:   .  Lack of Transportation (Medical): Not on file  . Lack of Transportation (Non-Medical): Not on file  Physical Activity:   . Days of Exercise per Week: Not on file  . Minutes of Exercise per Session: Not on file  Stress:   . Feeling of Stress : Not on file  Social Connections:   . Frequency of Communication with Friends and Family: Not on file  . Frequency of Social Gatherings with Friends and Family: Not on file  . Attends Religious Services: Not on file  . Active Member of Clubs or Organizations: Not on file  . Attends Archivist Meetings: Not  on file  . Marital Status: Not on file  Intimate Partner Violence:   . Fear of Current or Ex-Partner: Not on file  . Emotionally Abused: Not on file  . Physically Abused: Not on file  . Sexually Abused: Not on file    FAMILY HISTORY:  Family History  Problem Relation Age of Onset  . Cancer Mother   . Hypertension Father   . Cancer Sister     CURRENT MEDICATIONS:  Current Outpatient Medications  Medication Sig Dispense Refill  . atorvastatin (LIPITOR) 80 MG tablet Take 80 mg by mouth daily at 6 PM.     . calcitRIOL (ROCALTROL) 0.25 MCG capsule Take 0.25 mcg by mouth daily.     . calcium acetate (PHOSLO) 667 MG capsule Take by mouth.    . carvedilol (COREG) 3.125 MG tablet Take 3.125 mg by mouth 2 (two) times daily with a meal.     . everolimus (AFINITOR) 5 MG tablet Take 1 tablet (5 mg total) by mouth daily. 28 tablet 2  . furosemide (LASIX) 40 MG tablet Take 40 mg by mouth every morning.    . iron polysaccharides (NIFEREX) 150 MG capsule Take 150 mg by mouth daily.     . metolazone (ZAROXOLYN) 5 MG tablet Take 5 mg by mouth every other day.    [name] octreotide (SANDOSTATIN LAR DEPOT) 30 MG injection Inject 30 mg into the muscle every 28 (twenty-eight) days.     . Omega-3 Fatty Acids (FISH OIL) 1000 MG CAPS Take 1,000 mg by mouth daily.     [name] pyridOXINE (B-6) 50 MG tablet Take 1 tablet (50 mg total) by mouth daily. 30 tablet 8  . sodium bicarbonate 650 MG tablet Take 650 mg by mouth daily.    . tamsulosin (FLOMAX) 0.4 MG CAPS capsule Take 0.4 mg by mouth daily.      No current facility-administered medications for this visit.    ALLERGIES:  No Known Allergies  PHYSICAL EXAM:  Performance status (ECOG): 1 - Symptomatic but completely ambulatory  Vitals:   03/29/20 1113  BP: 104/67  Pulse: 68  Resp: 18  Temp: (!) 97.3 F (36.3 C)  SpO2: 97%   Wt Readings from Last 3 Encounters:  03/29/20 197 lb 12 oz (89.7 kg)  02/02/20 202 lb 8 oz (91.9 kg)  12/31/19 192 lb 9.6 oz  (87.4 kg)   Physical Exam Vitals reviewed.  Constitutional:      Appearance: Normal appearance.  Cardiovascular:     Rate and Rhythm: Normal rate and regular rhythm.     Pulses: Normal pulses.     Heart sounds: Normal heart sounds.  Pulmonary:     Effort: Pulmonary effort is normal.     Breath sounds: Normal breath sounds.  Abdominal:     Palpations: Abdomen is soft. There is no hepatomegaly, splenomegaly or mass.  Tenderness: There is no abdominal tenderness.     Hernia: No hernia is present.  Musculoskeletal:     Right lower leg: No edema.     Left lower leg: No edema.  Neurological:     General: No focal deficit present.     Mental Status: Allen Davenport is alert and oriented to person, place, and time.  Psychiatric:        Mood and Affect: Mood normal.        Behavior: Behavior normal.      LABORATORY DATA:  I have reviewed the labs as listed.  CBC Latest Ref Rng & Units 03/29/2020 02/02/2020 12/03/2019  WBC 4.0 - 10.5 K/uL 5.0 4.5 4.8  Hemoglobin 13.0 - 17.0 g/dL 11.1(L) 10.6(L) 10.3(L)  Hematocrit 39 - 52 % 34.8(L) 32.9(L) 32.4(L)  Platelets 150 - 400 K/uL 158 125(L) 163   CMP Latest Ref Rng & Units 03/29/2020 02/02/2020 12/31/2019  Glucose 70 - 99 mg/dL 162(H) 109(H) -  BUN 8 - 23 mg/dL 49(H) 46(H) -  Creatinine 0.61 - 1.24 mg/dL 9.04(H) 7.15(H) -  Sodium 135 - 145 mmol/L 134(L) 138 -  Potassium 3.5 - 5.1 mmol/L 3.8 4.0 -  Chloride 98 - 111 mmol/L 93(L) 98 -  CO2 22 - 32 mmol/L 28 28 -  Calcium 8.9 - 10.3 mg/dL 8.7(L) 7.8(L) -  Total Protein 6.5 - 8.1 g/dL 6.2(L) 6.1(L) 6.6  Total Bilirubin 0.3 - 1.2 mg/dL 0.7 0.7 0.6  Alkaline Phos 38 - 126 U/L 46 63 -  AST 15 - 41 U/L 29 28 29   ALT 0 - 44 U/L 16 10 29    Lab Results  Component Value Date   LDH 153 02/02/2020   LDH 162 12/03/2019   LDH 170 11/05/2019    DIAGNOSTIC IMAGING:  I have independently reviewed the scans and discussed with the patient. No results found.   ASSESSMENT:  1. Malignant carcinoid tumor to  the liver and peritoneum: -Sandostatin started back about 5 years ago.  Everolimus 5 mg daily started on 05/17/2018. -PET scan on 10/29/2019 showed multifocal well-differentiated neuroendocrine tumor with intense avid lesions in the liver, mesentery, peritoneal space.  No clear increase in size of the lesions.  Many lesions have increased in radiotracer activity.  2. Normocytic anemia: -This is from combination of end-stage renal disease and relative iron deficiency and myelosuppression from everolimus.  3. ESRD on HD: -Started on HD on 05/20/2019.   PLAN:  1. Malignant carcinoid tumor to the liver and peritoneum: -Allen Davenport is tolerating everolimus 5 mg daily very well.  No mucositis. -Allen Davenport is also tolerating monthly Sandostatin injections very well. -No signs of carcinoid syndrome at this time. -Last chromogranin was 600.  Level from today is pending.  LFTs were grossly normal. -Continue monthly Sandostatin and everolimus daily. -RTC 8 weeks with labs.  Will consider scans if there is any significant change in chromogranin from baseline.  2. Normocytic anemia: -Allen Davenport receives Retacrit at hemodialysis.  3. ESRD on HD: -Continue HD on Tuesday, Thursday and Saturday.   Orders placed this encounter:  No orders of the defined types were placed in this encounter.    Derek Jack, MD [place] [phone]   I, Milinda Antis, am acting as a scribe for Dr. Sanda Linger.  I, Derek Jack MD, have reviewed the above documentation for accuracy and completeness, and I agree with the above.

## 2020-03-30 DIAGNOSIS — N186 End stage renal disease: Secondary | ICD-10-CM | POA: Diagnosis not present

## 2020-03-30 DIAGNOSIS — N2581 Secondary hyperparathyroidism of renal origin: Secondary | ICD-10-CM | POA: Diagnosis not present

## 2020-03-30 DIAGNOSIS — Z992 Dependence on renal dialysis: Secondary | ICD-10-CM | POA: Diagnosis not present

## 2020-03-30 DIAGNOSIS — D631 Anemia in chronic kidney disease: Secondary | ICD-10-CM | POA: Diagnosis not present

## 2020-03-30 DIAGNOSIS — D509 Iron deficiency anemia, unspecified: Secondary | ICD-10-CM | POA: Diagnosis not present

## 2020-03-31 LAB — CHROMOGRANIN A: Chromogranin A (ng/mL): 601.7 ng/mL — ABNORMAL HIGH (ref 0.0–101.8)

## 2020-04-01 DIAGNOSIS — N186 End stage renal disease: Secondary | ICD-10-CM | POA: Diagnosis not present

## 2020-04-01 DIAGNOSIS — D631 Anemia in chronic kidney disease: Secondary | ICD-10-CM | POA: Diagnosis not present

## 2020-04-01 DIAGNOSIS — N2581 Secondary hyperparathyroidism of renal origin: Secondary | ICD-10-CM | POA: Diagnosis not present

## 2020-04-01 DIAGNOSIS — D509 Iron deficiency anemia, unspecified: Secondary | ICD-10-CM | POA: Diagnosis not present

## 2020-04-01 DIAGNOSIS — Z992 Dependence on renal dialysis: Secondary | ICD-10-CM | POA: Diagnosis not present

## 2020-04-03 DIAGNOSIS — Z992 Dependence on renal dialysis: Secondary | ICD-10-CM | POA: Diagnosis not present

## 2020-04-03 DIAGNOSIS — N2581 Secondary hyperparathyroidism of renal origin: Secondary | ICD-10-CM | POA: Diagnosis not present

## 2020-04-03 DIAGNOSIS — D509 Iron deficiency anemia, unspecified: Secondary | ICD-10-CM | POA: Diagnosis not present

## 2020-04-03 DIAGNOSIS — N186 End stage renal disease: Secondary | ICD-10-CM | POA: Diagnosis not present

## 2020-04-03 DIAGNOSIS — D631 Anemia in chronic kidney disease: Secondary | ICD-10-CM | POA: Diagnosis not present

## 2020-04-06 DIAGNOSIS — Z992 Dependence on renal dialysis: Secondary | ICD-10-CM | POA: Diagnosis not present

## 2020-04-06 DIAGNOSIS — N186 End stage renal disease: Secondary | ICD-10-CM | POA: Diagnosis not present

## 2020-04-06 DIAGNOSIS — D509 Iron deficiency anemia, unspecified: Secondary | ICD-10-CM | POA: Diagnosis not present

## 2020-04-06 DIAGNOSIS — N2581 Secondary hyperparathyroidism of renal origin: Secondary | ICD-10-CM | POA: Diagnosis not present

## 2020-04-06 DIAGNOSIS — D631 Anemia in chronic kidney disease: Secondary | ICD-10-CM | POA: Diagnosis not present

## 2020-04-08 DIAGNOSIS — D631 Anemia in chronic kidney disease: Secondary | ICD-10-CM | POA: Diagnosis not present

## 2020-04-08 DIAGNOSIS — N186 End stage renal disease: Secondary | ICD-10-CM | POA: Diagnosis not present

## 2020-04-08 DIAGNOSIS — N2581 Secondary hyperparathyroidism of renal origin: Secondary | ICD-10-CM | POA: Diagnosis not present

## 2020-04-08 DIAGNOSIS — D509 Iron deficiency anemia, unspecified: Secondary | ICD-10-CM | POA: Diagnosis not present

## 2020-04-08 DIAGNOSIS — Z992 Dependence on renal dialysis: Secondary | ICD-10-CM | POA: Diagnosis not present

## 2020-04-10 DIAGNOSIS — N2581 Secondary hyperparathyroidism of renal origin: Secondary | ICD-10-CM | POA: Diagnosis not present

## 2020-04-10 DIAGNOSIS — D631 Anemia in chronic kidney disease: Secondary | ICD-10-CM | POA: Diagnosis not present

## 2020-04-10 DIAGNOSIS — Z992 Dependence on renal dialysis: Secondary | ICD-10-CM | POA: Diagnosis not present

## 2020-04-10 DIAGNOSIS — D509 Iron deficiency anemia, unspecified: Secondary | ICD-10-CM | POA: Diagnosis not present

## 2020-04-10 DIAGNOSIS — N186 End stage renal disease: Secondary | ICD-10-CM | POA: Diagnosis not present

## 2020-04-13 DIAGNOSIS — D631 Anemia in chronic kidney disease: Secondary | ICD-10-CM | POA: Diagnosis not present

## 2020-04-13 DIAGNOSIS — D509 Iron deficiency anemia, unspecified: Secondary | ICD-10-CM | POA: Diagnosis not present

## 2020-04-13 DIAGNOSIS — N186 End stage renal disease: Secondary | ICD-10-CM | POA: Diagnosis not present

## 2020-04-13 DIAGNOSIS — N2581 Secondary hyperparathyroidism of renal origin: Secondary | ICD-10-CM | POA: Diagnosis not present

## 2020-04-13 DIAGNOSIS — Z992 Dependence on renal dialysis: Secondary | ICD-10-CM | POA: Diagnosis not present

## 2020-04-15 DIAGNOSIS — Z992 Dependence on renal dialysis: Secondary | ICD-10-CM | POA: Diagnosis not present

## 2020-04-15 DIAGNOSIS — D631 Anemia in chronic kidney disease: Secondary | ICD-10-CM | POA: Diagnosis not present

## 2020-04-15 DIAGNOSIS — D509 Iron deficiency anemia, unspecified: Secondary | ICD-10-CM | POA: Diagnosis not present

## 2020-04-15 DIAGNOSIS — N2581 Secondary hyperparathyroidism of renal origin: Secondary | ICD-10-CM | POA: Diagnosis not present

## 2020-04-15 DIAGNOSIS — N186 End stage renal disease: Secondary | ICD-10-CM | POA: Diagnosis not present

## 2020-04-17 DIAGNOSIS — Z992 Dependence on renal dialysis: Secondary | ICD-10-CM | POA: Diagnosis not present

## 2020-04-17 DIAGNOSIS — N2581 Secondary hyperparathyroidism of renal origin: Secondary | ICD-10-CM | POA: Diagnosis not present

## 2020-04-17 DIAGNOSIS — N186 End stage renal disease: Secondary | ICD-10-CM | POA: Diagnosis not present

## 2020-04-17 DIAGNOSIS — D509 Iron deficiency anemia, unspecified: Secondary | ICD-10-CM | POA: Diagnosis not present

## 2020-04-17 DIAGNOSIS — Z23 Encounter for immunization: Secondary | ICD-10-CM | POA: Diagnosis not present

## 2020-04-17 DIAGNOSIS — D631 Anemia in chronic kidney disease: Secondary | ICD-10-CM | POA: Diagnosis not present

## 2020-04-20 DIAGNOSIS — N2581 Secondary hyperparathyroidism of renal origin: Secondary | ICD-10-CM | POA: Diagnosis not present

## 2020-04-20 DIAGNOSIS — Z23 Encounter for immunization: Secondary | ICD-10-CM | POA: Diagnosis not present

## 2020-04-20 DIAGNOSIS — D631 Anemia in chronic kidney disease: Secondary | ICD-10-CM | POA: Diagnosis not present

## 2020-04-20 DIAGNOSIS — D509 Iron deficiency anemia, unspecified: Secondary | ICD-10-CM | POA: Diagnosis not present

## 2020-04-20 DIAGNOSIS — N186 End stage renal disease: Secondary | ICD-10-CM | POA: Diagnosis not present

## 2020-04-20 DIAGNOSIS — Z992 Dependence on renal dialysis: Secondary | ICD-10-CM | POA: Diagnosis not present

## 2020-04-21 DIAGNOSIS — N185 Chronic kidney disease, stage 5: Secondary | ICD-10-CM | POA: Diagnosis not present

## 2020-04-21 DIAGNOSIS — I10 Essential (primary) hypertension: Secondary | ICD-10-CM | POA: Diagnosis not present

## 2020-04-21 DIAGNOSIS — M818 Other osteoporosis without current pathological fracture: Secondary | ICD-10-CM | POA: Diagnosis not present

## 2020-04-21 DIAGNOSIS — E7849 Other hyperlipidemia: Secondary | ICD-10-CM | POA: Diagnosis not present

## 2020-04-22 DIAGNOSIS — N2581 Secondary hyperparathyroidism of renal origin: Secondary | ICD-10-CM | POA: Diagnosis not present

## 2020-04-22 DIAGNOSIS — Z992 Dependence on renal dialysis: Secondary | ICD-10-CM | POA: Diagnosis not present

## 2020-04-22 DIAGNOSIS — Z23 Encounter for immunization: Secondary | ICD-10-CM | POA: Diagnosis not present

## 2020-04-22 DIAGNOSIS — N186 End stage renal disease: Secondary | ICD-10-CM | POA: Diagnosis not present

## 2020-04-22 DIAGNOSIS — D509 Iron deficiency anemia, unspecified: Secondary | ICD-10-CM | POA: Diagnosis not present

## 2020-04-22 DIAGNOSIS — D631 Anemia in chronic kidney disease: Secondary | ICD-10-CM | POA: Diagnosis not present

## 2020-04-24 DIAGNOSIS — Z23 Encounter for immunization: Secondary | ICD-10-CM | POA: Diagnosis not present

## 2020-04-24 DIAGNOSIS — N186 End stage renal disease: Secondary | ICD-10-CM | POA: Diagnosis not present

## 2020-04-24 DIAGNOSIS — D509 Iron deficiency anemia, unspecified: Secondary | ICD-10-CM | POA: Diagnosis not present

## 2020-04-24 DIAGNOSIS — N2581 Secondary hyperparathyroidism of renal origin: Secondary | ICD-10-CM | POA: Diagnosis not present

## 2020-04-24 DIAGNOSIS — D631 Anemia in chronic kidney disease: Secondary | ICD-10-CM | POA: Diagnosis not present

## 2020-04-24 DIAGNOSIS — Z992 Dependence on renal dialysis: Secondary | ICD-10-CM | POA: Diagnosis not present

## 2020-04-26 ENCOUNTER — Other Ambulatory Visit: Payer: Self-pay

## 2020-04-26 ENCOUNTER — Inpatient Hospital Stay (HOSPITAL_COMMUNITY): Payer: Medicare Other | Attending: Hematology

## 2020-04-26 ENCOUNTER — Other Ambulatory Visit (HOSPITAL_COMMUNITY): Payer: Self-pay | Admitting: Surgery

## 2020-04-26 ENCOUNTER — Encounter (HOSPITAL_COMMUNITY): Payer: Self-pay

## 2020-04-26 VITALS — BP 100/54 | HR 66 | Temp 98.2°F | Resp 18

## 2020-04-26 DIAGNOSIS — C7A00 Malignant carcinoid tumor of unspecified site: Secondary | ICD-10-CM | POA: Diagnosis not present

## 2020-04-26 DIAGNOSIS — C7B02 Secondary carcinoid tumors of liver: Secondary | ICD-10-CM | POA: Insufficient documentation

## 2020-04-26 DIAGNOSIS — C7B04 Secondary carcinoid tumors of peritoneum: Secondary | ICD-10-CM | POA: Insufficient documentation

## 2020-04-26 DIAGNOSIS — C7A019 Malignant carcinoid tumor of the small intestine, unspecified portion: Secondary | ICD-10-CM

## 2020-04-26 MED ORDER — OCTREOTIDE ACETATE 30 MG IM KIT
30.0000 mg | PACK | Freq: Once | INTRAMUSCULAR | Status: AC
  Administered 2020-04-26: 30 mg via INTRAMUSCULAR

## 2020-04-26 MED ORDER — EVEROLIMUS 5 MG PO TABS: 5.0000 mg | ORAL_TABLET | Freq: Every day | ORAL | 6 refills | Status: DC

## 2020-04-26 NOTE — Patient Instructions (Signed)
[facility] at [facility] Discharge Instructions  Received Sandostatin injection today. Follow-up as scheduled   Thank you for choosing [facility] at [facility] to provide your oncology and hematology care.  To afford each patient quality time with our provider, please arrive at least 15 minutes before your scheduled appointment time.   If you have a lab appointment with the Cancer Center please come in thru the Main Entrance and check in at the main information desk.  You need to re-schedule your appointment should you arrive 10 or more minutes late.  We strive to give you quality time with our providers, and arriving late affects you and other patients whose appointments are after yours.  Also, if you no show three or more times for appointments you may be dismissed from the clinic at the providers discretion.     Again, thank you for choosing [facility].  Our hope is that these requests will decrease the amount of time that you wait before being seen by our physicians.       _____________________________________________________________  Should you have questions after your visit to [facility], please contact our office at [phone] and follow the prompts.  Our office hours are 8:00 a.m. and 4:30 p.m. Monday - Friday.  Please note that voicemails left after 4:00 p.m. may not be returned until the following business day.  We are closed weekends and major holidays.  You do have access to a nurse 24-7, just call the main number to the clinic [phone] and do not press any options, hold on the line and a nurse will answer the phone.    For prescription refill requests, have your pharmacy contact our office and allow 72 hours.    Due to Covid, you will need to wear a mask upon entering the hospital. If you do not have a mask, a mask will be given to you at the Main Entrance upon arrival. For doctor visits, patients may  have 1 support person age 18 or older with them. For treatment visits, patients can not have anyone with them due to social distancing guidelines and our immunocompromised population.     

## 2020-04-26 NOTE — Progress Notes (Signed)
Allen Davenport tolerated Sandostatin injection well without complaints or incident. VSS Pt discharged self ambulatory in satisfactory condition 

## 2020-04-27 DIAGNOSIS — Z23 Encounter for immunization: Secondary | ICD-10-CM | POA: Diagnosis not present

## 2020-04-27 DIAGNOSIS — D509 Iron deficiency anemia, unspecified: Secondary | ICD-10-CM | POA: Diagnosis not present

## 2020-04-27 DIAGNOSIS — Z992 Dependence on renal dialysis: Secondary | ICD-10-CM | POA: Diagnosis not present

## 2020-04-27 DIAGNOSIS — N2581 Secondary hyperparathyroidism of renal origin: Secondary | ICD-10-CM | POA: Diagnosis not present

## 2020-04-27 DIAGNOSIS — D631 Anemia in chronic kidney disease: Secondary | ICD-10-CM | POA: Diagnosis not present

## 2020-04-27 DIAGNOSIS — N186 End stage renal disease: Secondary | ICD-10-CM | POA: Diagnosis not present

## 2020-04-29 DIAGNOSIS — D509 Iron deficiency anemia, unspecified: Secondary | ICD-10-CM | POA: Diagnosis not present

## 2020-04-29 DIAGNOSIS — N186 End stage renal disease: Secondary | ICD-10-CM | POA: Diagnosis not present

## 2020-04-29 DIAGNOSIS — D631 Anemia in chronic kidney disease: Secondary | ICD-10-CM | POA: Diagnosis not present

## 2020-04-29 DIAGNOSIS — N2581 Secondary hyperparathyroidism of renal origin: Secondary | ICD-10-CM | POA: Diagnosis not present

## 2020-04-29 DIAGNOSIS — Z992 Dependence on renal dialysis: Secondary | ICD-10-CM | POA: Diagnosis not present

## 2020-04-29 DIAGNOSIS — Z23 Encounter for immunization: Secondary | ICD-10-CM | POA: Diagnosis not present

## 2020-05-01 DIAGNOSIS — N186 End stage renal disease: Secondary | ICD-10-CM | POA: Diagnosis not present

## 2020-05-01 DIAGNOSIS — D631 Anemia in chronic kidney disease: Secondary | ICD-10-CM | POA: Diagnosis not present

## 2020-05-01 DIAGNOSIS — Z23 Encounter for immunization: Secondary | ICD-10-CM | POA: Diagnosis not present

## 2020-05-01 DIAGNOSIS — N2581 Secondary hyperparathyroidism of renal origin: Secondary | ICD-10-CM | POA: Diagnosis not present

## 2020-05-01 DIAGNOSIS — D509 Iron deficiency anemia, unspecified: Secondary | ICD-10-CM | POA: Diagnosis not present

## 2020-05-01 DIAGNOSIS — Z992 Dependence on renal dialysis: Secondary | ICD-10-CM | POA: Diagnosis not present

## 2020-05-04 DIAGNOSIS — Z992 Dependence on renal dialysis: Secondary | ICD-10-CM | POA: Diagnosis not present

## 2020-05-04 DIAGNOSIS — D509 Iron deficiency anemia, unspecified: Secondary | ICD-10-CM | POA: Diagnosis not present

## 2020-05-04 DIAGNOSIS — D631 Anemia in chronic kidney disease: Secondary | ICD-10-CM | POA: Diagnosis not present

## 2020-05-04 DIAGNOSIS — N186 End stage renal disease: Secondary | ICD-10-CM | POA: Diagnosis not present

## 2020-05-04 DIAGNOSIS — Z23 Encounter for immunization: Secondary | ICD-10-CM | POA: Diagnosis not present

## 2020-05-04 DIAGNOSIS — N2581 Secondary hyperparathyroidism of renal origin: Secondary | ICD-10-CM | POA: Diagnosis not present

## 2020-05-06 DIAGNOSIS — Z23 Encounter for immunization: Secondary | ICD-10-CM | POA: Diagnosis not present

## 2020-05-06 DIAGNOSIS — D631 Anemia in chronic kidney disease: Secondary | ICD-10-CM | POA: Diagnosis not present

## 2020-05-06 DIAGNOSIS — N186 End stage renal disease: Secondary | ICD-10-CM | POA: Diagnosis not present

## 2020-05-06 DIAGNOSIS — Z992 Dependence on renal dialysis: Secondary | ICD-10-CM | POA: Diagnosis not present

## 2020-05-06 DIAGNOSIS — N2581 Secondary hyperparathyroidism of renal origin: Secondary | ICD-10-CM | POA: Diagnosis not present

## 2020-05-06 DIAGNOSIS — D509 Iron deficiency anemia, unspecified: Secondary | ICD-10-CM | POA: Diagnosis not present

## 2020-05-08 DIAGNOSIS — Z992 Dependence on renal dialysis: Secondary | ICD-10-CM | POA: Diagnosis not present

## 2020-05-08 DIAGNOSIS — N186 End stage renal disease: Secondary | ICD-10-CM | POA: Diagnosis not present

## 2020-05-08 DIAGNOSIS — D631 Anemia in chronic kidney disease: Secondary | ICD-10-CM | POA: Diagnosis not present

## 2020-05-08 DIAGNOSIS — Z23 Encounter for immunization: Secondary | ICD-10-CM | POA: Diagnosis not present

## 2020-05-08 DIAGNOSIS — D509 Iron deficiency anemia, unspecified: Secondary | ICD-10-CM | POA: Diagnosis not present

## 2020-05-08 DIAGNOSIS — N2581 Secondary hyperparathyroidism of renal origin: Secondary | ICD-10-CM | POA: Diagnosis not present

## 2020-05-10 DIAGNOSIS — E1151 Type 2 diabetes mellitus with diabetic peripheral angiopathy without gangrene: Secondary | ICD-10-CM | POA: Diagnosis not present

## 2020-05-10 DIAGNOSIS — E1140 Type 2 diabetes mellitus with diabetic neuropathy, unspecified: Secondary | ICD-10-CM | POA: Diagnosis not present

## 2020-05-11 DIAGNOSIS — N186 End stage renal disease: Secondary | ICD-10-CM | POA: Diagnosis not present

## 2020-05-11 DIAGNOSIS — D509 Iron deficiency anemia, unspecified: Secondary | ICD-10-CM | POA: Diagnosis not present

## 2020-05-11 DIAGNOSIS — D631 Anemia in chronic kidney disease: Secondary | ICD-10-CM | POA: Diagnosis not present

## 2020-05-11 DIAGNOSIS — N2581 Secondary hyperparathyroidism of renal origin: Secondary | ICD-10-CM | POA: Diagnosis not present

## 2020-05-11 DIAGNOSIS — Z992 Dependence on renal dialysis: Secondary | ICD-10-CM | POA: Diagnosis not present

## 2020-05-11 DIAGNOSIS — Z23 Encounter for immunization: Secondary | ICD-10-CM | POA: Diagnosis not present

## 2020-05-13 DIAGNOSIS — D509 Iron deficiency anemia, unspecified: Secondary | ICD-10-CM | POA: Diagnosis not present

## 2020-05-13 DIAGNOSIS — N186 End stage renal disease: Secondary | ICD-10-CM | POA: Diagnosis not present

## 2020-05-13 DIAGNOSIS — Z23 Encounter for immunization: Secondary | ICD-10-CM | POA: Diagnosis not present

## 2020-05-13 DIAGNOSIS — N2581 Secondary hyperparathyroidism of renal origin: Secondary | ICD-10-CM | POA: Diagnosis not present

## 2020-05-13 DIAGNOSIS — D631 Anemia in chronic kidney disease: Secondary | ICD-10-CM | POA: Diagnosis not present

## 2020-05-13 DIAGNOSIS — Z992 Dependence on renal dialysis: Secondary | ICD-10-CM | POA: Diagnosis not present

## 2020-05-15 DIAGNOSIS — D509 Iron deficiency anemia, unspecified: Secondary | ICD-10-CM | POA: Diagnosis not present

## 2020-05-15 DIAGNOSIS — N2581 Secondary hyperparathyroidism of renal origin: Secondary | ICD-10-CM | POA: Diagnosis not present

## 2020-05-15 DIAGNOSIS — D631 Anemia in chronic kidney disease: Secondary | ICD-10-CM | POA: Diagnosis not present

## 2020-05-15 DIAGNOSIS — Z992 Dependence on renal dialysis: Secondary | ICD-10-CM | POA: Diagnosis not present

## 2020-05-15 DIAGNOSIS — N186 End stage renal disease: Secondary | ICD-10-CM | POA: Diagnosis not present

## 2020-05-15 DIAGNOSIS — Z23 Encounter for immunization: Secondary | ICD-10-CM | POA: Diagnosis not present

## 2020-05-16 DIAGNOSIS — Z992 Dependence on renal dialysis: Secondary | ICD-10-CM | POA: Diagnosis not present

## 2020-05-16 DIAGNOSIS — N186 End stage renal disease: Secondary | ICD-10-CM | POA: Diagnosis not present

## 2020-05-18 DIAGNOSIS — D509 Iron deficiency anemia, unspecified: Secondary | ICD-10-CM | POA: Diagnosis not present

## 2020-05-18 DIAGNOSIS — D631 Anemia in chronic kidney disease: Secondary | ICD-10-CM | POA: Diagnosis not present

## 2020-05-18 DIAGNOSIS — Z992 Dependence on renal dialysis: Secondary | ICD-10-CM | POA: Diagnosis not present

## 2020-05-18 DIAGNOSIS — N186 End stage renal disease: Secondary | ICD-10-CM | POA: Diagnosis not present

## 2020-05-19 DIAGNOSIS — E7849 Other hyperlipidemia: Secondary | ICD-10-CM | POA: Diagnosis not present

## 2020-05-19 DIAGNOSIS — M818 Other osteoporosis without current pathological fracture: Secondary | ICD-10-CM | POA: Diagnosis not present

## 2020-05-19 DIAGNOSIS — I10 Essential (primary) hypertension: Secondary | ICD-10-CM | POA: Diagnosis not present

## 2020-05-19 DIAGNOSIS — N185 Chronic kidney disease, stage 5: Secondary | ICD-10-CM | POA: Diagnosis not present

## 2020-05-20 DIAGNOSIS — D509 Iron deficiency anemia, unspecified: Secondary | ICD-10-CM | POA: Diagnosis not present

## 2020-05-20 DIAGNOSIS — Z992 Dependence on renal dialysis: Secondary | ICD-10-CM | POA: Diagnosis not present

## 2020-05-20 DIAGNOSIS — N186 End stage renal disease: Secondary | ICD-10-CM | POA: Diagnosis not present

## 2020-05-20 DIAGNOSIS — D631 Anemia in chronic kidney disease: Secondary | ICD-10-CM | POA: Diagnosis not present

## 2020-05-22 DIAGNOSIS — D631 Anemia in chronic kidney disease: Secondary | ICD-10-CM | POA: Diagnosis not present

## 2020-05-22 DIAGNOSIS — Z992 Dependence on renal dialysis: Secondary | ICD-10-CM | POA: Diagnosis not present

## 2020-05-22 DIAGNOSIS — D509 Iron deficiency anemia, unspecified: Secondary | ICD-10-CM | POA: Diagnosis not present

## 2020-05-22 DIAGNOSIS — N186 End stage renal disease: Secondary | ICD-10-CM | POA: Diagnosis not present

## 2020-05-24 ENCOUNTER — Inpatient Hospital Stay (HOSPITAL_BASED_OUTPATIENT_CLINIC_OR_DEPARTMENT_OTHER): Payer: Medicare Other | Admitting: Hematology

## 2020-05-24 ENCOUNTER — Inpatient Hospital Stay (HOSPITAL_COMMUNITY): Payer: Medicare Other | Attending: Hematology

## 2020-05-24 ENCOUNTER — Other Ambulatory Visit: Payer: Self-pay

## 2020-05-24 ENCOUNTER — Inpatient Hospital Stay (HOSPITAL_COMMUNITY): Payer: Medicare Other

## 2020-05-24 VITALS — BP 111/67 | HR 60 | Temp 97.0°F | Resp 18 | Wt 197.3 lb

## 2020-05-24 DIAGNOSIS — C7A019 Malignant carcinoid tumor of the small intestine, unspecified portion: Secondary | ICD-10-CM | POA: Diagnosis not present

## 2020-05-24 DIAGNOSIS — C7B02 Secondary carcinoid tumors of liver: Secondary | ICD-10-CM | POA: Insufficient documentation

## 2020-05-24 DIAGNOSIS — C7B04 Secondary carcinoid tumors of peritoneum: Secondary | ICD-10-CM | POA: Diagnosis not present

## 2020-05-24 DIAGNOSIS — C7A00 Malignant carcinoid tumor of unspecified site: Secondary | ICD-10-CM

## 2020-05-24 LAB — CBC WITH DIFFERENTIAL/PLATELET
Abs Immature Granulocytes: 0.01 10*3/uL (ref 0.00–0.07)
Basophils Absolute: 0 10*3/uL (ref 0.0–0.1)
Basophils Relative: 0 %
Eosinophils Absolute: 0.1 10*3/uL (ref 0.0–0.5)
Eosinophils Relative: 2 %
HCT: 32.1 % — ABNORMAL LOW (ref 39.0–52.0)
Hemoglobin: 10 g/dL — ABNORMAL LOW (ref 13.0–17.0)
Immature Granulocytes: 0 %
Lymphocytes Relative: 10 %
Lymphs Abs: 0.5 10*3/uL — ABNORMAL LOW (ref 0.7–4.0)
MCH: 28.4 pg (ref 26.0–34.0)
MCHC: 31.2 g/dL (ref 30.0–36.0)
MCV: 91.2 fL (ref 80.0–100.0)
Monocytes Absolute: 0.4 10*3/uL (ref 0.1–1.0)
Monocytes Relative: 9 %
Neutro Abs: 3.9 10*3/uL (ref 1.7–7.7)
Neutrophils Relative %: 79 %
Platelets: 155 10*3/uL (ref 150–400)
RBC: 3.52 MIL/uL — ABNORMAL LOW (ref 4.22–5.81)
RDW: 13.8 % (ref 11.5–15.5)
WBC: 4.9 10*3/uL (ref 4.0–10.5)
nRBC: 0 % (ref 0.0–0.2)

## 2020-05-24 LAB — COMPREHENSIVE METABOLIC PANEL
ALT: 19 U/L (ref 0–44)
AST: 34 U/L (ref 15–41)
Albumin: 3.5 g/dL (ref 3.5–5.0)
Alkaline Phosphatase: 54 U/L (ref 38–126)
Anion gap: 13 (ref 5–15)
BUN: 39 mg/dL — ABNORMAL HIGH (ref 8–23)
CO2: 27 mmol/L (ref 22–32)
Calcium: 7.8 mg/dL — ABNORMAL LOW (ref 8.9–10.3)
Chloride: 96 mmol/L — ABNORMAL LOW (ref 98–111)
Creatinine, Ser: 8.44 mg/dL — ABNORMAL HIGH (ref 0.61–1.24)
GFR, Estimated: 6 mL/min — ABNORMAL LOW (ref >60)
Glucose, Bld: 87 mg/dL (ref 70–99)
Potassium: 3.3 mmol/L — ABNORMAL LOW (ref 3.5–5.1)
Sodium: 136 mmol/L (ref 135–145)
Total Bilirubin: 0.9 mg/dL (ref 0.3–1.2)
Total Protein: 6.3 g/dL — ABNORMAL LOW (ref 6.5–8.1)

## 2020-05-24 MED ORDER — OCTREOTIDE ACETATE 30 MG IM KIT
30.0000 mg | PACK | Freq: Once | INTRAMUSCULAR | Status: AC
  Administered 2020-05-24: 30 mg via INTRAMUSCULAR

## 2020-05-24 NOTE — Addendum Note (Signed)
Addended by: Donetta Potts on: 05/24/2020 04:41 PM   Modules accepted: Orders

## 2020-05-24 NOTE — Progress Notes (Signed)
Allen Davenport, [place] [address]   CLINIC:  Medical Oncology/Hematology  PCP:  [name], MD [place] / [place] [phone]   REASON FOR VISIT:  Follow-up for metastatic carcinoid tumor  PRIOR THERAPY: None  NGS Results: Not done  CURRENT THERAPY: Everolimus daily & Sandostatin monthly  BRIEF ONCOLOGIC HISTORY:  Oncology History   No history exists.    CANCER STAGING: Cancer Staging No matching staging information was found for the patient.  INTERVAL HISTORY:  Allen Davenport, a 79 y.o. male, returns for routine follow-up of his metastatic carcinoid tumor. Allen Davenport was last seen on 03/29/2020.   Today he reports feeling well. He is tolerating the everolimus and Sandostatin well; he denies having nausea. He has been going to dialysis for a year now. He denies diarrhea, abdominal cramping, wheezing or flushing. He is receiving Procrit shots during dialysis. He denies nosebleeds, hematochezia or hematuria.   REVIEW OF SYSTEMS:  Review of Systems  Constitutional: Positive for appetite change (75%) and fatigue (75%).  HENT:   Negative for nosebleeds.   Respiratory: Negative for wheezing.   Gastrointestinal: Negative for abdominal pain, blood in stool, diarrhea and nausea.  Genitourinary: Negative for hematuria.   Skin: Negative for rash.  All other systems reviewed and are negative.   PAST MEDICAL/SURGICAL HISTORY:  Past Medical History:  Diagnosis Date  . Anemia   . Cancer [facility])    right renal . Prostate- Radiation treatment  . CHF (congestive heart failure) ([place])   . Chronic kidney disease    Dialysis T/TH/Sa  . Edema   . Heart murmur    "nothing to worry about"  . Hyperlipidemia   . Hypertension   . Malignant carcinoid tumor ([place]) 01/03/2016  . Pneumonia    as a child   Past Surgical History:  Procedure Laterality Date  . AV FISTULA PLACEMENT Left 10/20/2014   Procedure: Left Arm  ARTERIOVENOUS (AV) FISTULA CREATION;  Surgeon: Elam Dutch, MD;  Location: [place];  Service: Vascular;  Laterality: Left;  . NEPHRECTOMY Right 2010  . PERIPHERAL VASCULAR BALLOON ANGIOPLASTY  06/20/2019   Procedure: PERIPHERAL VASCULAR BALLOON ANGIOPLASTY;  Surgeon: Elam Dutch, MD;  Location: [place] [facility];  Service: Cardiovascular;;  . REVISON OF ARTERIOVENOUS FISTULA Left 08/04/2019   Procedure: REVISON OF ARTERIOVENOUS FISTULA WITH SIDE BRANCH LIGATION;  Surgeon: Elam Dutch, MD;  Location: [facility] OR;  Service: Vascular;  Laterality: Left;    SOCIAL HISTORY:  Social History   Socioeconomic History  . Marital status: Married    Spouse name: Not on file  . Number of children: Not on file  . Years of education: Not on file  . Highest education level: Not on file  Occupational History  . Not on file  Tobacco Use  . Smoking status: Never Smoker  . Smokeless tobacco: Never Used  Vaping Use  . Vaping Use: Never used  Substance and Sexual Activity  . Alcohol use: No    Alcohol/week: 0.0 standard drinks  . Drug use: No  . Sexual activity: Not on file  Other Topics Concern  . Not on file  Social History Narrative  . Not on file   Social Determinants of Health   Financial Resource Strain:   . Difficulty of Paying Living Expenses: Not on file  Food Insecurity:   . Worried About Charity fundraiser in the Last Year: Not on file  .  Ran Out of Food in the Last Year: Not on file  Transportation Needs:   . Lack of Transportation (Medical): Not on file  . Lack of Transportation (Non-Medical): Not on file  Physical Activity:   . Days of Exercise per Week: Not on file  . Minutes of Exercise per Session: Not on file  Stress:   . Feeling of Stress : Not on file  Social Connections:   . Frequency of Communication with Friends and Family: Not on file  . Frequency of Social Gatherings with Friends and Family: Not on file  . Attends Religious Services: Not on file  .  Active Member of Clubs or Organizations: Not on file  . Attends Archivist Meetings: Not on file  . Marital Status: Not on file  Intimate Partner Violence:   . Fear of Current or Ex-Partner: Not on file  . Emotionally Abused: Not on file  . Physically Abused: Not on file  . Sexually Abused: Not on file    FAMILY HISTORY:  Family History  Problem Relation Age of Onset  . Cancer Mother   . Hypertension Father   . Cancer Sister     CURRENT MEDICATIONS:  Current Outpatient Medications  Medication Sig Dispense Refill  . amLODipine (NORVASC) 10 MG tablet Take 10 mg by mouth daily.    [name] atorvastatin (LIPITOR) 80 MG tablet Take 80 mg by mouth daily at 6 PM.     . calcitRIOL (ROCALTROL) 0.25 MCG capsule Take 0.25 mcg by mouth daily.     . calcium acetate (PHOSLO) 667 MG capsule Take by mouth.    . carvedilol (COREG) 3.125 MG tablet Take 3.125 mg by mouth 2 (two) times daily with a meal.     . everolimus (AFINITOR) 5 MG tablet Take 1 tablet (5 mg total) by mouth daily. 28 tablet 6  . furosemide (LASIX) 40 MG tablet Take 40 mg by mouth every morning.    . iron polysaccharides (NIFEREX) 150 MG capsule Take 150 mg by mouth daily.     . metolazone (ZAROXOLYN) 5 MG tablet Take 5 mg by mouth every other day.    [name] octreotide (SANDOSTATIN LAR DEPOT) 30 MG injection Inject 30 mg into the muscle every 28 (twenty-eight) days.     . Omega-3 Fatty Acids (FISH OIL) 1000 MG CAPS Take 1,000 mg by mouth daily.     [name] pyridOXINE (B-6) 50 MG tablet Take 1 tablet (50 mg total) by mouth daily. 30 tablet 8  . sodium bicarbonate 650 MG tablet Take 650 mg by mouth daily.    . tamsulosin (FLOMAX) 0.4 MG CAPS capsule Take 0.4 mg by mouth daily.      No current facility-administered medications for this visit.    ALLERGIES:  No Known Allergies  PHYSICAL EXAM:  Performance status (ECOG): 1 - Symptomatic but completely ambulatory  Vitals:   05/24/20 1216  BP: 111/67  Pulse: 60  Resp: 18  Temp:  (!) 97 F (36.1 C)  SpO2: 100%   Wt Readings from Last 3 Encounters:  05/24/20 197 lb 5 oz (89.5 kg)  03/29/20 197 lb 12 oz (89.7 kg)  02/02/20 202 lb 8 oz (91.9 kg)   Physical Exam Vitals reviewed.  Constitutional:      Appearance: Normal appearance.  Cardiovascular:     Rate and Rhythm: Normal rate and regular rhythm.     Pulses: Normal pulses.     Heart sounds: Normal heart sounds.  Pulmonary:  Effort: Pulmonary effort is normal.     Breath sounds: Normal breath sounds.  Abdominal:     Palpations: Abdomen is soft. There is no mass.     Tenderness: There is no abdominal tenderness.     Hernia: No hernia is present.  Neurological:     General: No focal deficit present.     Mental Status: He is alert and oriented to person, place, and time.  Psychiatric:        Mood and Affect: Mood normal.        Behavior: Behavior normal.      LABORATORY DATA:  I have reviewed the labs as listed.  CBC Latest Ref Rng & Units 05/24/2020 03/29/2020 02/02/2020  WBC 4.0 - 10.5 K/uL 4.9 5.0 4.5  Hemoglobin 13.0 - 17.0 g/dL 10.0(L) 11.1(L) 10.6(L)  Hematocrit 39 - 52 % 32.1(L) 34.8(L) 32.9(L)  Platelets 150 - 400 K/uL 155 158 125(L)   CMP Latest Ref Rng & Units 05/24/2020 03/29/2020 02/02/2020  Glucose 70 - 99 mg/dL 87 162(H) 109(H)  BUN 8 - 23 mg/dL 39(H) 49(H) 46(H)  Creatinine 0.61 - 1.24 mg/dL 8.44(H) 9.04(H) 7.15(H)  Sodium 135 - 145 mmol/L 136 134(L) 138  Potassium 3.5 - 5.1 mmol/L 3.3(L) 3.8 4.0  Chloride 98 - 111 mmol/L 96(L) 93(L) 98  CO2 22 - 32 mmol/L 27 28 28   Calcium 8.9 - 10.3 mg/dL 7.8(L) 8.7(L) 7.8(L)  Total Protein 6.5 - 8.1 g/dL 6.3(L) 6.2(L) 6.1(L)  Total Bilirubin 0.3 - 1.2 mg/dL 0.9 0.7 0.7  Alkaline Phos 38 - 126 U/L 54 46 63  AST 15 - 41 U/L 34 29 28  ALT 0 - 44 U/L 19 16 10     DIAGNOSTIC IMAGING:  I have independently reviewed the scans and discussed with the patient. No results found.   ASSESSMENT:  1. Malignant carcinoid tumor to the liver and  peritoneum: -Sandostatin started back about 5 years ago. Everolimus 5 mg daily started on 05/17/2018. -PET scan on 10/29/2019 showed multifocal well-differentiated neuroendocrine tumor with intense avid lesions in the liver, mesentery, peritoneal space. No clear increase in size of the lesions. Many lesions have increased in radiotracer activity.  2. Normocytic anemia: -This is from combination of end-stage renal disease and relative iron deficiency and myelosuppression from everolimus.  3. ESRD on HD: -Started on HD on 05/20/2019.   PLAN:  1. Malignant carcinoid tumor to the liver and peritoneum: -He is continuing to tolerate everolimus 5 mg daily very well.  No evidence of hand-foot skin reaction or mucositis. -Continue monthly Sandostatin injections. -No signs or symptoms of carcinoid syndrome at this time. -Reviewed labs which showed chromogranin A is 601 and stable.  CBC from today shows normal white count and platelet count.  LFTs are grossly normal. -RTC 2 months with labs.  Plan to repeat scan around April of next year.  2. Normocytic anemia: -Continue Retacrit at hemodialysis.  Hemoglobin today is 10.0.  3. ESRD on HD: -Continue HD on Tuesday, Thursday and Saturday.   Orders placed this encounter:  No orders of the defined types were placed in this encounter.    Derek Jack, MD [place] [phone]   I, Milinda Antis, am acting as a scribe for Dr. Sanda Linger.  I, Derek Jack MD, have reviewed the above documentation for accuracy and completeness, and I agree with the above.

## 2020-05-24 NOTE — Patient Instructions (Signed)
[place] at [facility] Discharge Instructions  You were seen today by Dr. Delton Coombes. He went over your recent results. You received your Sandostatin injection today; continue receiving it every month. Dr. Delton Coombes will see you back in 2 months for labs and follow up.   Thank you for choosing [place] at [facility] to provide your oncology and hematology care.  To afford each patient quality time with our provider, please arrive at least 15 minutes before your scheduled appointment time.   If you have a lab appointment with the [place] please come in thru the Main Entrance and check in at the main information desk  You need to re-schedule your appointment should you arrive 10 or more minutes late.  We strive to give you quality time with our providers, and arriving late affects you and other patients whose appointments are after yours.  Also, if you no show three or more times for appointments you may be dismissed from the clinic at the providers discretion.     Again, thank you for choosing [facility].  Our hope is that these requests will decrease the amount of time that you wait before being seen by our physicians.       _____________________________________________________________  Should you have questions after your visit to [facility], please contact our office at [phone] between the hours of 8:00 a.m. and 4:30 p.m.  Voicemails left after 4:00 p.m. will not be returned until the following business day.  For prescription refill requests, have your pharmacy contact our office and allow 72 hours.    Cancer Center Support Programs:   > Cancer Support Group  2nd Tuesday of the month 1pm-2pm, Journey Room

## 2020-05-24 NOTE — Progress Notes (Signed)
Confirmed that patient is taking everolimus as prescribed and denies any side effects at this time.

## 2020-05-24 NOTE — Progress Notes (Signed)
Patient tolerated Sandostatin injection with no complaints voiced.  Site clean and dry with no bruising or swelling noted at site.  Band aid applied.  VSS with discharge and left ambulatory with no s/s of distress noted.

## 2020-05-25 DIAGNOSIS — D631 Anemia in chronic kidney disease: Secondary | ICD-10-CM | POA: Diagnosis not present

## 2020-05-25 DIAGNOSIS — Z992 Dependence on renal dialysis: Secondary | ICD-10-CM | POA: Diagnosis not present

## 2020-05-25 DIAGNOSIS — D509 Iron deficiency anemia, unspecified: Secondary | ICD-10-CM | POA: Diagnosis not present

## 2020-05-25 DIAGNOSIS — N186 End stage renal disease: Secondary | ICD-10-CM | POA: Diagnosis not present

## 2020-05-25 LAB — CHROMOGRANIN A: Chromogranin A (ng/mL): 700.4 ng/mL — ABNORMAL HIGH (ref 0.0–101.8)

## 2020-05-27 DIAGNOSIS — D509 Iron deficiency anemia, unspecified: Secondary | ICD-10-CM | POA: Diagnosis not present

## 2020-05-27 DIAGNOSIS — N186 End stage renal disease: Secondary | ICD-10-CM | POA: Diagnosis not present

## 2020-05-27 DIAGNOSIS — D631 Anemia in chronic kidney disease: Secondary | ICD-10-CM | POA: Diagnosis not present

## 2020-05-27 DIAGNOSIS — Z992 Dependence on renal dialysis: Secondary | ICD-10-CM | POA: Diagnosis not present

## 2020-05-29 DIAGNOSIS — D631 Anemia in chronic kidney disease: Secondary | ICD-10-CM | POA: Diagnosis not present

## 2020-05-29 DIAGNOSIS — N186 End stage renal disease: Secondary | ICD-10-CM | POA: Diagnosis not present

## 2020-05-29 DIAGNOSIS — Z992 Dependence on renal dialysis: Secondary | ICD-10-CM | POA: Diagnosis not present

## 2020-05-29 DIAGNOSIS — D509 Iron deficiency anemia, unspecified: Secondary | ICD-10-CM | POA: Diagnosis not present

## 2020-06-01 DIAGNOSIS — D631 Anemia in chronic kidney disease: Secondary | ICD-10-CM | POA: Diagnosis not present

## 2020-06-01 DIAGNOSIS — D509 Iron deficiency anemia, unspecified: Secondary | ICD-10-CM | POA: Diagnosis not present

## 2020-06-01 DIAGNOSIS — Z992 Dependence on renal dialysis: Secondary | ICD-10-CM | POA: Diagnosis not present

## 2020-06-01 DIAGNOSIS — N186 End stage renal disease: Secondary | ICD-10-CM | POA: Diagnosis not present

## 2020-06-03 DIAGNOSIS — Z992 Dependence on renal dialysis: Secondary | ICD-10-CM | POA: Diagnosis not present

## 2020-06-03 DIAGNOSIS — D509 Iron deficiency anemia, unspecified: Secondary | ICD-10-CM | POA: Diagnosis not present

## 2020-06-03 DIAGNOSIS — N186 End stage renal disease: Secondary | ICD-10-CM | POA: Diagnosis not present

## 2020-06-03 DIAGNOSIS — D631 Anemia in chronic kidney disease: Secondary | ICD-10-CM | POA: Diagnosis not present

## 2020-06-05 DIAGNOSIS — N186 End stage renal disease: Secondary | ICD-10-CM | POA: Diagnosis not present

## 2020-06-05 DIAGNOSIS — D509 Iron deficiency anemia, unspecified: Secondary | ICD-10-CM | POA: Diagnosis not present

## 2020-06-05 DIAGNOSIS — Z992 Dependence on renal dialysis: Secondary | ICD-10-CM | POA: Diagnosis not present

## 2020-06-05 DIAGNOSIS — D631 Anemia in chronic kidney disease: Secondary | ICD-10-CM | POA: Diagnosis not present

## 2020-06-08 DIAGNOSIS — Z992 Dependence on renal dialysis: Secondary | ICD-10-CM | POA: Diagnosis not present

## 2020-06-08 DIAGNOSIS — D509 Iron deficiency anemia, unspecified: Secondary | ICD-10-CM | POA: Diagnosis not present

## 2020-06-08 DIAGNOSIS — N186 End stage renal disease: Secondary | ICD-10-CM | POA: Diagnosis not present

## 2020-06-08 DIAGNOSIS — D631 Anemia in chronic kidney disease: Secondary | ICD-10-CM | POA: Diagnosis not present

## 2020-06-10 DIAGNOSIS — D631 Anemia in chronic kidney disease: Secondary | ICD-10-CM | POA: Diagnosis not present

## 2020-06-10 DIAGNOSIS — D509 Iron deficiency anemia, unspecified: Secondary | ICD-10-CM | POA: Diagnosis not present

## 2020-06-10 DIAGNOSIS — Z992 Dependence on renal dialysis: Secondary | ICD-10-CM | POA: Diagnosis not present

## 2020-06-10 DIAGNOSIS — N186 End stage renal disease: Secondary | ICD-10-CM | POA: Diagnosis not present

## 2020-06-12 DIAGNOSIS — D509 Iron deficiency anemia, unspecified: Secondary | ICD-10-CM | POA: Diagnosis not present

## 2020-06-12 DIAGNOSIS — D631 Anemia in chronic kidney disease: Secondary | ICD-10-CM | POA: Diagnosis not present

## 2020-06-12 DIAGNOSIS — N186 End stage renal disease: Secondary | ICD-10-CM | POA: Diagnosis not present

## 2020-06-12 DIAGNOSIS — Z992 Dependence on renal dialysis: Secondary | ICD-10-CM | POA: Diagnosis not present

## 2020-06-15 DIAGNOSIS — M818 Other osteoporosis without current pathological fracture: Secondary | ICD-10-CM | POA: Diagnosis not present

## 2020-06-15 DIAGNOSIS — N185 Chronic kidney disease, stage 5: Secondary | ICD-10-CM | POA: Diagnosis not present

## 2020-06-15 DIAGNOSIS — N186 End stage renal disease: Secondary | ICD-10-CM | POA: Diagnosis not present

## 2020-06-15 DIAGNOSIS — D509 Iron deficiency anemia, unspecified: Secondary | ICD-10-CM | POA: Diagnosis not present

## 2020-06-15 DIAGNOSIS — D631 Anemia in chronic kidney disease: Secondary | ICD-10-CM | POA: Diagnosis not present

## 2020-06-15 DIAGNOSIS — Z992 Dependence on renal dialysis: Secondary | ICD-10-CM | POA: Diagnosis not present

## 2020-06-15 DIAGNOSIS — I10 Essential (primary) hypertension: Secondary | ICD-10-CM | POA: Diagnosis not present

## 2020-06-15 DIAGNOSIS — E7849 Other hyperlipidemia: Secondary | ICD-10-CM | POA: Diagnosis not present

## 2020-06-17 DIAGNOSIS — D509 Iron deficiency anemia, unspecified: Secondary | ICD-10-CM | POA: Diagnosis not present

## 2020-06-17 DIAGNOSIS — N2581 Secondary hyperparathyroidism of renal origin: Secondary | ICD-10-CM | POA: Diagnosis not present

## 2020-06-17 DIAGNOSIS — N186 End stage renal disease: Secondary | ICD-10-CM | POA: Diagnosis not present

## 2020-06-17 DIAGNOSIS — D631 Anemia in chronic kidney disease: Secondary | ICD-10-CM | POA: Diagnosis not present

## 2020-06-17 DIAGNOSIS — Z992 Dependence on renal dialysis: Secondary | ICD-10-CM | POA: Diagnosis not present

## 2020-06-19 DIAGNOSIS — N186 End stage renal disease: Secondary | ICD-10-CM | POA: Diagnosis not present

## 2020-06-19 DIAGNOSIS — N2581 Secondary hyperparathyroidism of renal origin: Secondary | ICD-10-CM | POA: Diagnosis not present

## 2020-06-19 DIAGNOSIS — D631 Anemia in chronic kidney disease: Secondary | ICD-10-CM | POA: Diagnosis not present

## 2020-06-19 DIAGNOSIS — D509 Iron deficiency anemia, unspecified: Secondary | ICD-10-CM | POA: Diagnosis not present

## 2020-06-19 DIAGNOSIS — Z992 Dependence on renal dialysis: Secondary | ICD-10-CM | POA: Diagnosis not present

## 2020-06-21 ENCOUNTER — Encounter (HOSPITAL_COMMUNITY): Payer: Self-pay

## 2020-06-21 ENCOUNTER — Inpatient Hospital Stay (HOSPITAL_COMMUNITY): Payer: Medicare Other | Attending: Hematology

## 2020-06-21 ENCOUNTER — Other Ambulatory Visit (HOSPITAL_COMMUNITY): Payer: Medicare Other

## 2020-06-21 ENCOUNTER — Other Ambulatory Visit: Payer: Self-pay

## 2020-06-21 VITALS — BP 114/65 | HR 72 | Temp 97.1°F | Resp 18 | Wt 198.6 lb

## 2020-06-21 DIAGNOSIS — C7B02 Secondary carcinoid tumors of liver: Secondary | ICD-10-CM | POA: Diagnosis not present

## 2020-06-21 DIAGNOSIS — C7A00 Malignant carcinoid tumor of unspecified site: Secondary | ICD-10-CM | POA: Diagnosis not present

## 2020-06-21 DIAGNOSIS — C7B04 Secondary carcinoid tumors of peritoneum: Secondary | ICD-10-CM | POA: Diagnosis not present

## 2020-06-21 MED ORDER — OCTREOTIDE ACETATE 30 MG IM KIT
30.0000 mg | PACK | Freq: Once | INTRAMUSCULAR | Status: AC
  Administered 2020-06-21: 30 mg via INTRAMUSCULAR

## 2020-06-21 NOTE — Patient Instructions (Signed)
[facility] at [facility] Discharge Instructions  Received Sandostatin injection today. Follow-up as scheduled   Thank you for choosing [facility] at [facility] to provide your oncology and hematology care.  To afford each patient quality time with our provider, please arrive at least 15 minutes before your scheduled appointment time.   If you have a lab appointment with the Cancer Center please come in thru the Main Entrance and check in at the main information desk.  You need to re-schedule your appointment should you arrive 10 or more minutes late.  We strive to give you quality time with our providers, and arriving late affects you and other patients whose appointments are after yours.  Also, if you no show three or more times for appointments you may be dismissed from the clinic at the providers discretion.     Again, thank you for choosing [facility].  Our hope is that these requests will decrease the amount of time that you wait before being seen by our physicians.       _____________________________________________________________  Should you have questions after your visit to [facility], please contact our office at [phone] and follow the prompts.  Our office hours are 8:00 a.m. and 4:30 p.m. Monday - Friday.  Please note that voicemails left after 4:00 p.m. may not be returned until the following business day.  We are closed weekends and major holidays.  You do have access to a nurse 24-7, just call the main number to the clinic [phone] and do not press any options, hold on the line and a nurse will answer the phone.    For prescription refill requests, have your pharmacy contact our office and allow 72 hours.    Due to Covid, you will need to wear a mask upon entering the hospital. If you do not have a mask, a mask will be given to you at the Main Entrance upon arrival. For doctor visits, patients may  have 1 support person age 18 or older with them. For treatment visits, patients can not have anyone with them due to social distancing guidelines and our immunocompromised population.     

## 2020-06-21 NOTE — Progress Notes (Signed)
Allen Davenport tolerated Sandostatin injection well without complaints or incident. VSS Pt discharged self ambulatory in satisfactory condition 

## 2020-06-22 DIAGNOSIS — N186 End stage renal disease: Secondary | ICD-10-CM | POA: Diagnosis not present

## 2020-06-22 DIAGNOSIS — D509 Iron deficiency anemia, unspecified: Secondary | ICD-10-CM | POA: Diagnosis not present

## 2020-06-22 DIAGNOSIS — Z992 Dependence on renal dialysis: Secondary | ICD-10-CM | POA: Diagnosis not present

## 2020-06-22 DIAGNOSIS — N2581 Secondary hyperparathyroidism of renal origin: Secondary | ICD-10-CM | POA: Diagnosis not present

## 2020-06-22 DIAGNOSIS — D631 Anemia in chronic kidney disease: Secondary | ICD-10-CM | POA: Diagnosis not present

## 2020-06-24 ENCOUNTER — Telehealth (HOSPITAL_COMMUNITY): Payer: Self-pay | Admitting: Pharmacy Technician

## 2020-06-24 DIAGNOSIS — D631 Anemia in chronic kidney disease: Secondary | ICD-10-CM | POA: Diagnosis not present

## 2020-06-24 DIAGNOSIS — N186 End stage renal disease: Secondary | ICD-10-CM | POA: Diagnosis not present

## 2020-06-24 DIAGNOSIS — Z992 Dependence on renal dialysis: Secondary | ICD-10-CM | POA: Diagnosis not present

## 2020-06-24 DIAGNOSIS — D509 Iron deficiency anemia, unspecified: Secondary | ICD-10-CM | POA: Diagnosis not present

## 2020-06-24 DIAGNOSIS — N2581 Secondary hyperparathyroidism of renal origin: Secondary | ICD-10-CM | POA: Diagnosis not present

## 2020-06-24 NOTE — Telephone Encounter (Signed)
Oral Oncology Patient Advocate Encounter  Patient stopped by [facility] on 06/23/20 to complete a re-enrollment application for [employer] [facility] [place] (NPAF) in an effort to reduce the patient's out of pocket expense for [employer] to $0.    Application completed and faxed to [phone].   [employer]PA[employer] phone number for follow up is [phone].   This encounter will be updated until final determination.   Allen Davenport Patient [place] Phone [phone] Fax [phone] 06/24/2020 1:17 PM

## 2020-06-26 DIAGNOSIS — D631 Anemia in chronic kidney disease: Secondary | ICD-10-CM | POA: Diagnosis not present

## 2020-06-26 DIAGNOSIS — N186 End stage renal disease: Secondary | ICD-10-CM | POA: Diagnosis not present

## 2020-06-26 DIAGNOSIS — N2581 Secondary hyperparathyroidism of renal origin: Secondary | ICD-10-CM | POA: Diagnosis not present

## 2020-06-26 DIAGNOSIS — D509 Iron deficiency anemia, unspecified: Secondary | ICD-10-CM | POA: Diagnosis not present

## 2020-06-26 DIAGNOSIS — Z992 Dependence on renal dialysis: Secondary | ICD-10-CM | POA: Diagnosis not present

## 2020-06-29 DIAGNOSIS — D509 Iron deficiency anemia, unspecified: Secondary | ICD-10-CM | POA: Diagnosis not present

## 2020-06-29 DIAGNOSIS — N186 End stage renal disease: Secondary | ICD-10-CM | POA: Diagnosis not present

## 2020-06-29 DIAGNOSIS — Z992 Dependence on renal dialysis: Secondary | ICD-10-CM | POA: Diagnosis not present

## 2020-06-29 DIAGNOSIS — D631 Anemia in chronic kidney disease: Secondary | ICD-10-CM | POA: Diagnosis not present

## 2020-06-29 DIAGNOSIS — N2581 Secondary hyperparathyroidism of renal origin: Secondary | ICD-10-CM | POA: Diagnosis not present

## 2020-07-01 DIAGNOSIS — N2581 Secondary hyperparathyroidism of renal origin: Secondary | ICD-10-CM | POA: Diagnosis not present

## 2020-07-01 DIAGNOSIS — D509 Iron deficiency anemia, unspecified: Secondary | ICD-10-CM | POA: Diagnosis not present

## 2020-07-01 DIAGNOSIS — N186 End stage renal disease: Secondary | ICD-10-CM | POA: Diagnosis not present

## 2020-07-01 DIAGNOSIS — D631 Anemia in chronic kidney disease: Secondary | ICD-10-CM | POA: Diagnosis not present

## 2020-07-01 DIAGNOSIS — Z992 Dependence on renal dialysis: Secondary | ICD-10-CM | POA: Diagnosis not present

## 2020-07-03 DIAGNOSIS — N2581 Secondary hyperparathyroidism of renal origin: Secondary | ICD-10-CM | POA: Diagnosis not present

## 2020-07-03 DIAGNOSIS — D631 Anemia in chronic kidney disease: Secondary | ICD-10-CM | POA: Diagnosis not present

## 2020-07-03 DIAGNOSIS — D509 Iron deficiency anemia, unspecified: Secondary | ICD-10-CM | POA: Diagnosis not present

## 2020-07-03 DIAGNOSIS — Z992 Dependence on renal dialysis: Secondary | ICD-10-CM | POA: Diagnosis not present

## 2020-07-03 DIAGNOSIS — N186 End stage renal disease: Secondary | ICD-10-CM | POA: Diagnosis not present

## 2020-07-06 DIAGNOSIS — E7849 Other hyperlipidemia: Secondary | ICD-10-CM | POA: Diagnosis not present

## 2020-07-06 DIAGNOSIS — D509 Iron deficiency anemia, unspecified: Secondary | ICD-10-CM | POA: Diagnosis not present

## 2020-07-06 DIAGNOSIS — Z992 Dependence on renal dialysis: Secondary | ICD-10-CM | POA: Diagnosis not present

## 2020-07-06 DIAGNOSIS — N186 End stage renal disease: Secondary | ICD-10-CM | POA: Diagnosis not present

## 2020-07-06 DIAGNOSIS — N185 Chronic kidney disease, stage 5: Secondary | ICD-10-CM | POA: Diagnosis not present

## 2020-07-06 DIAGNOSIS — I10 Essential (primary) hypertension: Secondary | ICD-10-CM | POA: Diagnosis not present

## 2020-07-06 DIAGNOSIS — N2581 Secondary hyperparathyroidism of renal origin: Secondary | ICD-10-CM | POA: Diagnosis not present

## 2020-07-06 DIAGNOSIS — M818 Other osteoporosis without current pathological fracture: Secondary | ICD-10-CM | POA: Diagnosis not present

## 2020-07-06 DIAGNOSIS — D631 Anemia in chronic kidney disease: Secondary | ICD-10-CM | POA: Diagnosis not present

## 2020-07-08 DIAGNOSIS — Z992 Dependence on renal dialysis: Secondary | ICD-10-CM | POA: Diagnosis not present

## 2020-07-08 DIAGNOSIS — N2581 Secondary hyperparathyroidism of renal origin: Secondary | ICD-10-CM | POA: Diagnosis not present

## 2020-07-08 DIAGNOSIS — D509 Iron deficiency anemia, unspecified: Secondary | ICD-10-CM | POA: Diagnosis not present

## 2020-07-08 DIAGNOSIS — N186 End stage renal disease: Secondary | ICD-10-CM | POA: Diagnosis not present

## 2020-07-08 DIAGNOSIS — D631 Anemia in chronic kidney disease: Secondary | ICD-10-CM | POA: Diagnosis not present

## 2020-07-11 DIAGNOSIS — N2581 Secondary hyperparathyroidism of renal origin: Secondary | ICD-10-CM | POA: Diagnosis not present

## 2020-07-11 DIAGNOSIS — Z992 Dependence on renal dialysis: Secondary | ICD-10-CM | POA: Diagnosis not present

## 2020-07-11 DIAGNOSIS — D631 Anemia in chronic kidney disease: Secondary | ICD-10-CM | POA: Diagnosis not present

## 2020-07-11 DIAGNOSIS — N186 End stage renal disease: Secondary | ICD-10-CM | POA: Diagnosis not present

## 2020-07-11 DIAGNOSIS — D509 Iron deficiency anemia, unspecified: Secondary | ICD-10-CM | POA: Diagnosis not present

## 2020-07-13 DIAGNOSIS — D509 Iron deficiency anemia, unspecified: Secondary | ICD-10-CM | POA: Diagnosis not present

## 2020-07-13 DIAGNOSIS — D631 Anemia in chronic kidney disease: Secondary | ICD-10-CM | POA: Diagnosis not present

## 2020-07-13 DIAGNOSIS — Z992 Dependence on renal dialysis: Secondary | ICD-10-CM | POA: Diagnosis not present

## 2020-07-13 DIAGNOSIS — N186 End stage renal disease: Secondary | ICD-10-CM | POA: Diagnosis not present

## 2020-07-13 DIAGNOSIS — N2581 Secondary hyperparathyroidism of renal origin: Secondary | ICD-10-CM | POA: Diagnosis not present

## 2020-07-13 DIAGNOSIS — I259 Chronic ischemic heart disease, unspecified: Secondary | ICD-10-CM | POA: Diagnosis not present

## 2020-07-15 DIAGNOSIS — D509 Iron deficiency anemia, unspecified: Secondary | ICD-10-CM | POA: Diagnosis not present

## 2020-07-15 DIAGNOSIS — N2581 Secondary hyperparathyroidism of renal origin: Secondary | ICD-10-CM | POA: Diagnosis not present

## 2020-07-15 DIAGNOSIS — Z992 Dependence on renal dialysis: Secondary | ICD-10-CM | POA: Diagnosis not present

## 2020-07-15 DIAGNOSIS — N186 End stage renal disease: Secondary | ICD-10-CM | POA: Diagnosis not present

## 2020-07-15 DIAGNOSIS — D631 Anemia in chronic kidney disease: Secondary | ICD-10-CM | POA: Diagnosis not present

## 2020-07-16 ENCOUNTER — Other Ambulatory Visit: Payer: Self-pay | Admitting: Internal Medicine

## 2020-07-16 DIAGNOSIS — Z992 Dependence on renal dialysis: Secondary | ICD-10-CM | POA: Diagnosis not present

## 2020-07-16 DIAGNOSIS — N186 End stage renal disease: Secondary | ICD-10-CM | POA: Diagnosis not present

## 2020-07-17 DIAGNOSIS — D509 Iron deficiency anemia, unspecified: Secondary | ICD-10-CM | POA: Diagnosis not present

## 2020-07-17 DIAGNOSIS — D631 Anemia in chronic kidney disease: Secondary | ICD-10-CM | POA: Diagnosis not present

## 2020-07-17 DIAGNOSIS — E559 Vitamin D deficiency, unspecified: Secondary | ICD-10-CM | POA: Diagnosis not present

## 2020-07-17 DIAGNOSIS — N186 End stage renal disease: Secondary | ICD-10-CM | POA: Diagnosis not present

## 2020-07-17 DIAGNOSIS — Z992 Dependence on renal dialysis: Secondary | ICD-10-CM | POA: Diagnosis not present

## 2020-07-19 ENCOUNTER — Inpatient Hospital Stay (HOSPITAL_COMMUNITY): Payer: Medicare Other

## 2020-07-19 ENCOUNTER — Other Ambulatory Visit: Payer: Self-pay

## 2020-07-19 ENCOUNTER — Inpatient Hospital Stay (HOSPITAL_COMMUNITY): Payer: Medicare Other | Attending: Hematology

## 2020-07-19 ENCOUNTER — Inpatient Hospital Stay (HOSPITAL_BASED_OUTPATIENT_CLINIC_OR_DEPARTMENT_OTHER): Payer: Medicare Other | Admitting: Hematology

## 2020-07-19 VITALS — BP 110/60 | HR 70 | Temp 97.0°F | Resp 18 | Wt 199.0 lb

## 2020-07-19 DIAGNOSIS — C7A00 Malignant carcinoid tumor of unspecified site: Secondary | ICD-10-CM | POA: Diagnosis not present

## 2020-07-19 DIAGNOSIS — C7B02 Secondary carcinoid tumors of liver: Secondary | ICD-10-CM | POA: Diagnosis not present

## 2020-07-19 DIAGNOSIS — C7B04 Secondary carcinoid tumors of peritoneum: Secondary | ICD-10-CM | POA: Insufficient documentation

## 2020-07-19 DIAGNOSIS — C7A019 Malignant carcinoid tumor of the small intestine, unspecified portion: Secondary | ICD-10-CM | POA: Diagnosis not present

## 2020-07-19 DIAGNOSIS — D500 Iron deficiency anemia secondary to blood loss (chronic): Secondary | ICD-10-CM

## 2020-07-19 LAB — COMPREHENSIVE METABOLIC PANEL
ALT: 18 U/L (ref 0–44)
AST: 35 U/L (ref 15–41)
Albumin: 3.2 g/dL — ABNORMAL LOW (ref 3.5–5.0)
Alkaline Phosphatase: 66 U/L (ref 38–126)
Anion gap: 13 (ref 5–15)
BUN: 38 mg/dL — ABNORMAL HIGH (ref 8–23)
CO2: 26 mmol/L (ref 22–32)
Calcium: 8.5 mg/dL — ABNORMAL LOW (ref 8.9–10.3)
Chloride: 98 mmol/L (ref 98–111)
Creatinine, Ser: 8.34 mg/dL — ABNORMAL HIGH (ref 0.61–1.24)
GFR, Estimated: 6 mL/min — ABNORMAL LOW (ref >60)
Glucose, Bld: 170 mg/dL — ABNORMAL HIGH (ref 70–99)
Potassium: 4.3 mmol/L (ref 3.5–5.1)
Sodium: 137 mmol/L (ref 135–145)
Total Bilirubin: 0.6 mg/dL (ref 0.3–1.2)
Total Protein: 6.2 g/dL — ABNORMAL LOW (ref 6.5–8.1)

## 2020-07-19 LAB — CBC WITH DIFFERENTIAL/PLATELET
Abs Immature Granulocytes: 0.01 10*3/uL (ref 0.00–0.07)
Basophils Absolute: 0 10*3/uL (ref 0.0–0.1)
Basophils Relative: 0 %
Eosinophils Absolute: 0.1 10*3/uL (ref 0.0–0.5)
Eosinophils Relative: 3 %
HCT: 29.8 % — ABNORMAL LOW (ref 39.0–52.0)
Hemoglobin: 9.2 g/dL — ABNORMAL LOW (ref 13.0–17.0)
Immature Granulocytes: 0 %
Lymphocytes Relative: 7 %
Lymphs Abs: 0.4 10*3/uL — ABNORMAL LOW (ref 0.7–4.0)
MCH: 28 pg (ref 26.0–34.0)
MCHC: 30.9 g/dL (ref 30.0–36.0)
MCV: 90.6 fL (ref 80.0–100.0)
Monocytes Absolute: 0.4 10*3/uL (ref 0.1–1.0)
Monocytes Relative: 7 %
Neutro Abs: 4.4 10*3/uL (ref 1.7–7.7)
Neutrophils Relative %: 83 %
Platelets: 162 10*3/uL (ref 150–400)
RBC: 3.29 MIL/uL — ABNORMAL LOW (ref 4.22–5.81)
RDW: 13.5 % (ref 11.5–15.5)
WBC: 5.3 10*3/uL (ref 4.0–10.5)
nRBC: 0 % (ref 0.0–0.2)

## 2020-07-19 LAB — LACTATE DEHYDROGENASE: LDH: 186 U/L (ref 98–192)

## 2020-07-19 MED ORDER — OCTREOTIDE ACETATE 30 MG IM KIT
30.0000 mg | PACK | Freq: Once | INTRAMUSCULAR | Status: AC
  Administered 2020-07-19: 30 mg via INTRAMUSCULAR

## 2020-07-19 NOTE — Progress Notes (Signed)
Good for Sandostatin injection per Dr. Delton Coombes.

## 2020-07-19 NOTE — Patient Instructions (Signed)
[place] at [facility] Discharge Instructions  You were seen today by Dr. Delton Coombes. He went over your recent results. You received your Sandostatin injection today; continue getting your injection every 4 weeks. You will be scheduled for a PET scan before your next visit. Dr. Delton Coombes will see you back in 8 weeks for labs and follow up.   Thank you for choosing [place] at [facility] to provide your oncology and hematology care.  To afford each patient quality time with our provider, please arrive at least 15 minutes before your scheduled appointment time.   If you have a lab appointment with the [place] please come in thru the Main Entrance and check in at the main information desk  You need to re-schedule your appointment should you arrive 10 or more minutes late.  We strive to give you quality time with our providers, and arriving late affects you and other patients whose appointments are after yours.  Also, if you no show three or more times for appointments you may be dismissed from the clinic at the providers discretion.     Again, thank you for choosing [facility].  Our hope is that these requests will decrease the amount of time that you wait before being seen by our physicians.       _____________________________________________________________  Should you have questions after your visit to [facility], please contact our office at [phone] between the hours of 8:00 a.m. and 4:30 p.m.  Voicemails left after 4:00 p.m. will not be returned until the following business day.  For prescription refill requests, have your pharmacy contact our office and allow 72 hours.    Cancer Center Support Programs:   > Cancer Support Group  2nd Tuesday of the month 1pm-2pm, Journey Room

## 2020-07-19 NOTE — Progress Notes (Signed)
Allen Davenport, [place] [address]   CLINIC:  Medical Oncology/Hematology  PCP:  [name], MD [place] / [place] [address] [phone]   REASON FOR VISIT:  Follow-up for metastatic carcinoid tumor  PRIOR THERAPY: None  NGS Results: Not done  CURRENT THERAPY: Everolimus QD & Sandostatin monthly  BRIEF ONCOLOGIC HISTORY:  Oncology History   No history exists.    CANCER STAGING: Cancer Staging No matching staging information was found for the patient.  INTERVAL HISTORY:  Allen Davenport, a 80 y.o. male, returns for routine follow-up of his metastatic carcinoid tumor. Allen Davenport was last seen on 05/24/2020.   Today he reports feeling well. He denies having any new pains, N/V/D, abdominal pain, mouth sores, wheezing, skin flushing, recent F/C or infections. He is taking everolimus daily and is tolerating it and Sandostatin well. He is tolerating HD well and reports issues with his legs falling asleep after sitting for a prolonged time, but is tolerating it well.   REVIEW OF SYSTEMS:  Review of Systems  Constitutional: Positive for appetite change (75%) and fatigue (75%). Negative for chills and fever.  HENT:   Negative for mouth sores.   Respiratory: Negative for wheezing.   Gastrointestinal: Negative for abdominal pain, diarrhea, nausea and vomiting.  Skin: Negative for itching and rash.  All other systems reviewed and are negative.   PAST MEDICAL/SURGICAL HISTORY:  Past Medical History:  Diagnosis Date  . Anemia   . Cancer [facility])    right renal . Prostate- Radiation treatment  . CHF (congestive heart failure) ([place])   . Chronic kidney disease    Dialysis T/TH/Sa  . Edema   . Heart murmur    "nothing to worry about"  . Hyperlipidemia   . Hypertension   . Malignant carcinoid tumor ([place]) 01/03/2016  . Pneumonia    as a child   Past Surgical History:  Procedure Laterality Date  . AV FISTULA PLACEMENT Left  10/20/2014   Procedure: Left Arm ARTERIOVENOUS (AV) FISTULA CREATION;  Surgeon: Elam Dutch, MD;  Location: [place];  Service: Vascular;  Laterality: Left;  . NEPHRECTOMY Right 2010  . PERIPHERAL VASCULAR BALLOON ANGIOPLASTY  06/20/2019   Procedure: PERIPHERAL VASCULAR BALLOON ANGIOPLASTY;  Surgeon: Elam Dutch, MD;  Location: [place] CV LAB;  Service: Cardiovascular;;  . REVISON OF ARTERIOVENOUS FISTULA Left 08/04/2019   Procedure: REVISON OF ARTERIOVENOUS FISTULA WITH SIDE BRANCH LIGATION;  Surgeon: Elam Dutch, MD;  Location: [facility] OR;  Service: Vascular;  Laterality: Left;    SOCIAL HISTORY:  Social History   Socioeconomic History  . Marital status: Married    Spouse name: Not on file  . Number of children: Not on file  . Years of education: Not on file  . Highest education level: Not on file  Occupational History  . Not on file  Tobacco Use  . Smoking status: Never Smoker  . Smokeless tobacco: Never Used  Vaping Use  . Vaping Use: Never used  Substance and Sexual Activity  . Alcohol use: No    Alcohol/week: 0.0 standard drinks  . Drug use: No  . Sexual activity: Not on file  Other Topics Concern  . Not on file  Social History Narrative  . Not on file   Social Determinants of Health   Financial Resource Strain: Low Risk   . Difficulty of Paying Living Expenses: Not hard at all  Food Insecurity: No Food Insecurity  .  Worried About Charity fundraiser in the Last Year: Never true  . Ran Out of Food in the Last Year: Never true  Transportation Needs: No Transportation Needs  . Lack of Transportation (Medical): No  . Lack of Transportation (Non-Medical): No  Physical Activity: Insufficiently Active  . Days of Exercise per Week: 7 days  . Minutes of Exercise per Session: 10 min  Stress: No Stress Concern Present  . Feeling of Stress : Not at all  Social Connections: Moderately Integrated  . Frequency of Communication with Friends and Family: More than  three times a week  . Frequency of Social Gatherings with Friends and Family: Once a week  . Attends Religious Services: More than 4 times per year  . Active Member of Clubs or Organizations: No  . Attends Archivist Meetings: Never  . Marital Status: Married  Human resources officer Violence: Not At Risk  . Fear of Current or Ex-Partner: No  . Emotionally Abused: No  . Physically Abused: No  . Sexually Abused: No    FAMILY HISTORY:  Family History  Problem Relation Age of Onset  . Cancer Mother   . Hypertension Father   . Cancer Sister     CURRENT MEDICATIONS:  Current Outpatient Medications  Medication Sig Dispense Refill  . amLODipine (NORVASC) 10 MG tablet Take 10 mg by mouth daily.    [name] atorvastatin (LIPITOR) 80 MG tablet Take 80 mg by mouth daily at 6 PM.     . calcitRIOL (ROCALTROL) 0.25 MCG capsule Take 0.25 mcg by mouth daily.     . calcium acetate (PHOSLO) 667 MG capsule Take by mouth.    . carvedilol (COREG) 3.125 MG tablet Take 3.125 mg by mouth 2 (two) times daily with a meal.     . everolimus (AFINITOR) 5 MG tablet Take 1 tablet (5 mg total) by mouth daily. 28 tablet 6  . furosemide (LASIX) 40 MG tablet Take 40 mg by mouth every morning.    . iron polysaccharides (NIFEREX) 150 MG capsule Take 150 mg by mouth daily.     . metolazone (ZAROXOLYN) 5 MG tablet Take 5 mg by mouth every other day.    [name] octreotide (SANDOSTATIN LAR DEPOT) 30 MG injection Inject 30 mg into the muscle every 28 (twenty-eight) days.     . Omega-3 Fatty Acids (FISH OIL) 1000 MG CAPS Take 1,000 mg by mouth daily.     [name] pyridOXINE (B-6) 50 MG tablet Take 1 tablet (50 mg total) by mouth daily. 30 tablet 8  . sodium bicarbonate 650 MG tablet Take 650 mg by mouth daily.    . tamsulosin (FLOMAX) 0.4 MG CAPS capsule Take 0.4 mg by mouth daily.      No current facility-administered medications for this visit.    ALLERGIES:  No Known Allergies  PHYSICAL EXAM:  Performance status (ECOG): 1 -  Symptomatic but completely ambulatory  Vitals:   07/19/20 1051  BP: 110/60  Pulse: 70  Resp: 18  Temp: (!) 97 F (36.1 C)  SpO2: 100%   Wt Readings from Last 3 Encounters:  07/19/20 199 lb (90.3 kg)  06/21/20 198 lb 9.6 oz (90.1 kg)  05/24/20 197 lb 5 oz (89.5 kg)   Physical Exam Vitals reviewed.  Constitutional:      Appearance: Normal appearance.  Cardiovascular:     Rate and Rhythm: Normal rate and regular rhythm.     Pulses: Normal pulses.     Heart sounds: Normal heart sounds.  Pulmonary:     Effort: Pulmonary effort is normal.     Breath sounds: Normal breath sounds.  Abdominal:     Palpations: Abdomen is soft. There is no mass.     Tenderness: There is no abdominal tenderness.     Hernia: No hernia is present.  Musculoskeletal:     Right lower leg: No edema.     Left lower leg: No edema.  Neurological:     General: No focal deficit present.     Mental Status: He is alert and oriented to person, place, and time.  Psychiatric:        Mood and Affect: Mood normal.        Behavior: Behavior normal.      LABORATORY DATA:  I have reviewed the labs as listed.  CBC Latest Ref Rng & Units 07/19/2020 05/24/2020 03/29/2020  WBC 4.0 - 10.5 K/uL 5.3 4.9 5.0  Hemoglobin 13.0 - 17.0 g/dL 9.2(L) 10.0(L) 11.1(L)  Hematocrit 39.0 - 52.0 % 29.8(L) 32.1(L) 34.8(L)  Platelets 150 - 400 K/uL 162 155 158   CMP Latest Ref Rng & Units 07/19/2020 05/24/2020 03/29/2020  Glucose 70 - 99 mg/dL 170(H) 87 162(H)  BUN 8 - 23 mg/dL 38(H) 39(H) 49(H)  Creatinine 0.61 - 1.24 mg/dL 8.34(H) 8.44(H) 9.04(H)  Sodium 135 - 145 mmol/L 137 136 134(L)  Potassium 3.5 - 5.1 mmol/L 4.3 3.3(L) 3.8  Chloride 98 - 111 mmol/L 98 96(L) 93(L)  CO2 22 - 32 mmol/L 26 27 28   Calcium 8.9 - 10.3 mg/dL 8.5(L) 7.8(L) 8.7(L)  Total Protein 6.5 - 8.1 g/dL 6.2(L) 6.3(L) 6.2(L)  Total Bilirubin 0.3 - 1.2 mg/dL 0.6 0.9 0.7  Alkaline Phos 38 - 126 U/L 66 54 46  AST 15 - 41 U/L 35 34 29  ALT 0 - 44 U/L 18 19 16    Lab  Results  Component Value Date   LDH 186 07/19/2020   LDH 153 02/02/2020   LDH 162 12/03/2019    DIAGNOSTIC IMAGING:  I have independently reviewed the scans and discussed with the patient. No results found.   ASSESSMENT:  1. 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 Everolimus 5 mg daily started on 05/17/2018. -PET scan on 10/29/2019 showed multifocal well-differentiated neuroendocrine tumor with intense avid lesions in the liver, mesentery, peritoneal space. No clear increase in size of the lesions. Many lesions have increased in radiotracer activity.  2. Normocytic anemia: -This is from combination of end-stage renal disease and relative iron deficiency and myelosuppression from everolimus.  3. ESRD on HD: -Started on HD on 05/20/2019.   PLAN:  1. Malignant carcinoid tumor to the liver and peritoneum: -He is tolerating everolimus 500 mg daily very well.  No evidence of mucositis or HFSR. -He does not have any signs or symptoms of carcinoid syndrome. -Continue monthly Sandostatin. -RTC 2 months with labs.  We will plan for repeating PET scan prior to next visit.  2. Normocytic anemia: -Hemoglobin is stable between 9 and 10.  He is likely getting Retacrit with dialysis.  3. ESRD on HD: -Continue HD on Tuesday, Thursday and Saturday.     Orders placed this encounter:  No orders of the defined types were placed in this encounter.    Derek Jack, MD [place] [phone]   I, Milinda Antis, am acting as a scribe for Dr. Sanda Linger.  I, Derek Jack MD, have reviewed the above documentation for accuracy and completeness,  and I agree with the above.

## 2020-07-19 NOTE — Progress Notes (Signed)
Patient tolerated Sandostatin injection with no complaints voiced.  Site clean and dry with no bruising or swelling noted.  No complaints of pain.  Discharged with vital signs stable and no signs or symptoms of distress noted.  

## 2020-07-20 DIAGNOSIS — Z992 Dependence on renal dialysis: Secondary | ICD-10-CM | POA: Diagnosis not present

## 2020-07-20 DIAGNOSIS — E559 Vitamin D deficiency, unspecified: Secondary | ICD-10-CM | POA: Diagnosis not present

## 2020-07-20 DIAGNOSIS — D631 Anemia in chronic kidney disease: Secondary | ICD-10-CM | POA: Diagnosis not present

## 2020-07-20 DIAGNOSIS — N186 End stage renal disease: Secondary | ICD-10-CM | POA: Diagnosis not present

## 2020-07-20 DIAGNOSIS — D509 Iron deficiency anemia, unspecified: Secondary | ICD-10-CM | POA: Diagnosis not present

## 2020-07-20 LAB — CHROMOGRANIN A: Chromogranin A (ng/mL): 606.3 ng/mL — ABNORMAL HIGH (ref 0.0–101.8)

## 2020-07-22 DIAGNOSIS — D509 Iron deficiency anemia, unspecified: Secondary | ICD-10-CM | POA: Diagnosis not present

## 2020-07-22 DIAGNOSIS — E559 Vitamin D deficiency, unspecified: Secondary | ICD-10-CM | POA: Diagnosis not present

## 2020-07-22 DIAGNOSIS — D631 Anemia in chronic kidney disease: Secondary | ICD-10-CM | POA: Diagnosis not present

## 2020-07-22 DIAGNOSIS — Z992 Dependence on renal dialysis: Secondary | ICD-10-CM | POA: Diagnosis not present

## 2020-07-22 DIAGNOSIS — N186 End stage renal disease: Secondary | ICD-10-CM | POA: Diagnosis not present

## 2020-07-24 DIAGNOSIS — D631 Anemia in chronic kidney disease: Secondary | ICD-10-CM | POA: Diagnosis not present

## 2020-07-24 DIAGNOSIS — E559 Vitamin D deficiency, unspecified: Secondary | ICD-10-CM | POA: Diagnosis not present

## 2020-07-24 DIAGNOSIS — N186 End stage renal disease: Secondary | ICD-10-CM | POA: Diagnosis not present

## 2020-07-24 DIAGNOSIS — Z992 Dependence on renal dialysis: Secondary | ICD-10-CM | POA: Diagnosis not present

## 2020-07-24 DIAGNOSIS — D509 Iron deficiency anemia, unspecified: Secondary | ICD-10-CM | POA: Diagnosis not present

## 2020-07-26 DIAGNOSIS — E1151 Type 2 diabetes mellitus with diabetic peripheral angiopathy without gangrene: Secondary | ICD-10-CM | POA: Diagnosis not present

## 2020-07-26 DIAGNOSIS — E1140 Type 2 diabetes mellitus with diabetic neuropathy, unspecified: Secondary | ICD-10-CM | POA: Diagnosis not present

## 2020-07-27 DIAGNOSIS — D631 Anemia in chronic kidney disease: Secondary | ICD-10-CM | POA: Diagnosis not present

## 2020-07-27 DIAGNOSIS — D509 Iron deficiency anemia, unspecified: Secondary | ICD-10-CM | POA: Diagnosis not present

## 2020-07-27 DIAGNOSIS — Z992 Dependence on renal dialysis: Secondary | ICD-10-CM | POA: Diagnosis not present

## 2020-07-27 DIAGNOSIS — E559 Vitamin D deficiency, unspecified: Secondary | ICD-10-CM | POA: Diagnosis not present

## 2020-07-27 DIAGNOSIS — N186 End stage renal disease: Secondary | ICD-10-CM | POA: Diagnosis not present

## 2020-07-29 DIAGNOSIS — Z992 Dependence on renal dialysis: Secondary | ICD-10-CM | POA: Diagnosis not present

## 2020-07-29 DIAGNOSIS — D631 Anemia in chronic kidney disease: Secondary | ICD-10-CM | POA: Diagnosis not present

## 2020-07-29 DIAGNOSIS — N186 End stage renal disease: Secondary | ICD-10-CM | POA: Diagnosis not present

## 2020-07-29 DIAGNOSIS — E559 Vitamin D deficiency, unspecified: Secondary | ICD-10-CM | POA: Diagnosis not present

## 2020-07-29 DIAGNOSIS — D509 Iron deficiency anemia, unspecified: Secondary | ICD-10-CM | POA: Diagnosis not present

## 2020-07-31 DIAGNOSIS — Z992 Dependence on renal dialysis: Secondary | ICD-10-CM | POA: Diagnosis not present

## 2020-07-31 DIAGNOSIS — D631 Anemia in chronic kidney disease: Secondary | ICD-10-CM | POA: Diagnosis not present

## 2020-07-31 DIAGNOSIS — D509 Iron deficiency anemia, unspecified: Secondary | ICD-10-CM | POA: Diagnosis not present

## 2020-07-31 DIAGNOSIS — N186 End stage renal disease: Secondary | ICD-10-CM | POA: Diagnosis not present

## 2020-07-31 DIAGNOSIS — E559 Vitamin D deficiency, unspecified: Secondary | ICD-10-CM | POA: Diagnosis not present

## 2020-08-03 DIAGNOSIS — D631 Anemia in chronic kidney disease: Secondary | ICD-10-CM | POA: Diagnosis not present

## 2020-08-03 DIAGNOSIS — D509 Iron deficiency anemia, unspecified: Secondary | ICD-10-CM | POA: Diagnosis not present

## 2020-08-03 DIAGNOSIS — Z992 Dependence on renal dialysis: Secondary | ICD-10-CM | POA: Diagnosis not present

## 2020-08-03 DIAGNOSIS — N186 End stage renal disease: Secondary | ICD-10-CM | POA: Diagnosis not present

## 2020-08-03 DIAGNOSIS — E559 Vitamin D deficiency, unspecified: Secondary | ICD-10-CM | POA: Diagnosis not present

## 2020-08-05 DIAGNOSIS — D509 Iron deficiency anemia, unspecified: Secondary | ICD-10-CM | POA: Diagnosis not present

## 2020-08-05 DIAGNOSIS — Z992 Dependence on renal dialysis: Secondary | ICD-10-CM | POA: Diagnosis not present

## 2020-08-05 DIAGNOSIS — N186 End stage renal disease: Secondary | ICD-10-CM | POA: Diagnosis not present

## 2020-08-05 DIAGNOSIS — D631 Anemia in chronic kidney disease: Secondary | ICD-10-CM | POA: Diagnosis not present

## 2020-08-05 DIAGNOSIS — E559 Vitamin D deficiency, unspecified: Secondary | ICD-10-CM | POA: Diagnosis not present

## 2020-08-07 DIAGNOSIS — E559 Vitamin D deficiency, unspecified: Secondary | ICD-10-CM | POA: Diagnosis not present

## 2020-08-07 DIAGNOSIS — D631 Anemia in chronic kidney disease: Secondary | ICD-10-CM | POA: Diagnosis not present

## 2020-08-07 DIAGNOSIS — D509 Iron deficiency anemia, unspecified: Secondary | ICD-10-CM | POA: Diagnosis not present

## 2020-08-07 DIAGNOSIS — N186 End stage renal disease: Secondary | ICD-10-CM | POA: Diagnosis not present

## 2020-08-07 DIAGNOSIS — Z992 Dependence on renal dialysis: Secondary | ICD-10-CM | POA: Diagnosis not present

## 2020-08-10 DIAGNOSIS — N186 End stage renal disease: Secondary | ICD-10-CM | POA: Diagnosis not present

## 2020-08-10 DIAGNOSIS — Z992 Dependence on renal dialysis: Secondary | ICD-10-CM | POA: Diagnosis not present

## 2020-08-10 DIAGNOSIS — E559 Vitamin D deficiency, unspecified: Secondary | ICD-10-CM | POA: Diagnosis not present

## 2020-08-10 DIAGNOSIS — D631 Anemia in chronic kidney disease: Secondary | ICD-10-CM | POA: Diagnosis not present

## 2020-08-10 DIAGNOSIS — D509 Iron deficiency anemia, unspecified: Secondary | ICD-10-CM | POA: Diagnosis not present

## 2020-08-12 DIAGNOSIS — N186 End stage renal disease: Secondary | ICD-10-CM | POA: Diagnosis not present

## 2020-08-12 DIAGNOSIS — D509 Iron deficiency anemia, unspecified: Secondary | ICD-10-CM | POA: Diagnosis not present

## 2020-08-12 DIAGNOSIS — E559 Vitamin D deficiency, unspecified: Secondary | ICD-10-CM | POA: Diagnosis not present

## 2020-08-12 DIAGNOSIS — Z992 Dependence on renal dialysis: Secondary | ICD-10-CM | POA: Diagnosis not present

## 2020-08-12 DIAGNOSIS — D631 Anemia in chronic kidney disease: Secondary | ICD-10-CM | POA: Diagnosis not present

## 2020-08-14 DIAGNOSIS — D509 Iron deficiency anemia, unspecified: Secondary | ICD-10-CM | POA: Diagnosis not present

## 2020-08-14 DIAGNOSIS — N186 End stage renal disease: Secondary | ICD-10-CM | POA: Diagnosis not present

## 2020-08-14 DIAGNOSIS — E559 Vitamin D deficiency, unspecified: Secondary | ICD-10-CM | POA: Diagnosis not present

## 2020-08-14 DIAGNOSIS — D631 Anemia in chronic kidney disease: Secondary | ICD-10-CM | POA: Diagnosis not present

## 2020-08-14 DIAGNOSIS — Z992 Dependence on renal dialysis: Secondary | ICD-10-CM | POA: Diagnosis not present

## 2020-08-16 ENCOUNTER — Other Ambulatory Visit: Payer: Self-pay

## 2020-08-16 ENCOUNTER — Encounter (HOSPITAL_COMMUNITY): Payer: Self-pay

## 2020-08-16 ENCOUNTER — Inpatient Hospital Stay (HOSPITAL_COMMUNITY): Payer: Medicare Other

## 2020-08-16 VITALS — BP 108/60 | HR 78 | Temp 97.0°F | Resp 18 | Wt 196.0 lb

## 2020-08-16 DIAGNOSIS — C7A00 Malignant carcinoid tumor of unspecified site: Secondary | ICD-10-CM | POA: Diagnosis not present

## 2020-08-16 DIAGNOSIS — C7B02 Secondary carcinoid tumors of liver: Secondary | ICD-10-CM | POA: Diagnosis not present

## 2020-08-16 DIAGNOSIS — N186 End stage renal disease: Secondary | ICD-10-CM | POA: Diagnosis not present

## 2020-08-16 DIAGNOSIS — Z992 Dependence on renal dialysis: Secondary | ICD-10-CM | POA: Diagnosis not present

## 2020-08-16 DIAGNOSIS — C7B04 Secondary carcinoid tumors of peritoneum: Secondary | ICD-10-CM | POA: Diagnosis not present

## 2020-08-16 MED ORDER — OCTREOTIDE ACETATE 30 MG IM KIT
30.0000 mg | PACK | Freq: Once | INTRAMUSCULAR | Status: AC
  Administered 2020-08-16: 30 mg via INTRAMUSCULAR

## 2020-08-16 NOTE — Progress Notes (Signed)
Allen Davenport tolerated Sandostatin injection well without complaints or incident. VSS Pt discharged self ambulatory in satisfactory condition 

## 2020-08-16 NOTE — Telephone Encounter (Signed)
Oral Oncology Patient Advocate Encounter  Received notification from [employer] Patient Assistance program that patient has been successfully enrolled into their program to receive Afinitor from the manufacturer at $0 out of pocket until 07/16/21.    I called and spoke with patient.  He knows we will have to re-apply.   Specialty Pharmacy that will dispense medication is [facility].  Patient knows to call the office with questions or concerns.   Oral Oncology Clinic will continue to follow.  [place] [place] [facility] [place] Phone [phone] Fax [phone] 08/16/2020 4:01 PM

## 2020-08-16 NOTE — Patient Instructions (Signed)
[facility] at [facility] Discharge Instructions  Received Sandostatin injection today. Follow-up as scheduled   Thank you for choosing [facility] at [facility] to provide your oncology and hematology care.  To afford each patient quality time with our provider, please arrive at least 15 minutes before your scheduled appointment time.   If you have a lab appointment with the Cancer Center please come in thru the Main Entrance and check in at the main information desk.  You need to re-schedule your appointment should you arrive 10 or more minutes late.  We strive to give you quality time with our providers, and arriving late affects you and other patients whose appointments are after yours.  Also, if you no show three or more times for appointments you may be dismissed from the clinic at the providers discretion.     Again, thank you for choosing [facility].  Our hope is that these requests will decrease the amount of time that you wait before being seen by our physicians.       _____________________________________________________________  Should you have questions after your visit to [facility], please contact our office at [phone] and follow the prompts.  Our office hours are 8:00 a.m. and 4:30 p.m. Monday - Friday.  Please note that voicemails left after 4:00 p.m. may not be returned until the following business day.  We are closed weekends and major holidays.  You do have access to a nurse 24-7, just call the main number to the clinic [phone] and do not press any options, hold on the line and a nurse will answer the phone.    For prescription refill requests, have your pharmacy contact our office and allow 72 hours.    Due to Covid, you will need to wear a mask upon entering the hospital. If you do not have a mask, a mask will be given to you at the Main Entrance upon arrival. For doctor visits, patients may  have 1 support person age 18 or older with them. For treatment visits, patients can not have anyone with them due to social distancing guidelines and our immunocompromised population.     

## 2020-08-17 DIAGNOSIS — D509 Iron deficiency anemia, unspecified: Secondary | ICD-10-CM | POA: Diagnosis not present

## 2020-08-17 DIAGNOSIS — Z992 Dependence on renal dialysis: Secondary | ICD-10-CM | POA: Diagnosis not present

## 2020-08-17 DIAGNOSIS — D631 Anemia in chronic kidney disease: Secondary | ICD-10-CM | POA: Diagnosis not present

## 2020-08-17 DIAGNOSIS — N186 End stage renal disease: Secondary | ICD-10-CM | POA: Diagnosis not present

## 2020-08-19 DIAGNOSIS — D631 Anemia in chronic kidney disease: Secondary | ICD-10-CM | POA: Diagnosis not present

## 2020-08-19 DIAGNOSIS — D509 Iron deficiency anemia, unspecified: Secondary | ICD-10-CM | POA: Diagnosis not present

## 2020-08-19 DIAGNOSIS — N186 End stage renal disease: Secondary | ICD-10-CM | POA: Diagnosis not present

## 2020-08-19 DIAGNOSIS — Z992 Dependence on renal dialysis: Secondary | ICD-10-CM | POA: Diagnosis not present

## 2020-08-21 DIAGNOSIS — D631 Anemia in chronic kidney disease: Secondary | ICD-10-CM | POA: Diagnosis not present

## 2020-08-21 DIAGNOSIS — N186 End stage renal disease: Secondary | ICD-10-CM | POA: Diagnosis not present

## 2020-08-21 DIAGNOSIS — Z992 Dependence on renal dialysis: Secondary | ICD-10-CM | POA: Diagnosis not present

## 2020-08-21 DIAGNOSIS — D509 Iron deficiency anemia, unspecified: Secondary | ICD-10-CM | POA: Diagnosis not present

## 2020-08-24 DIAGNOSIS — D631 Anemia in chronic kidney disease: Secondary | ICD-10-CM | POA: Diagnosis not present

## 2020-08-24 DIAGNOSIS — N186 End stage renal disease: Secondary | ICD-10-CM | POA: Diagnosis not present

## 2020-08-24 DIAGNOSIS — D509 Iron deficiency anemia, unspecified: Secondary | ICD-10-CM | POA: Diagnosis not present

## 2020-08-24 DIAGNOSIS — Z992 Dependence on renal dialysis: Secondary | ICD-10-CM | POA: Diagnosis not present

## 2020-08-26 DIAGNOSIS — D631 Anemia in chronic kidney disease: Secondary | ICD-10-CM | POA: Diagnosis not present

## 2020-08-26 DIAGNOSIS — Z992 Dependence on renal dialysis: Secondary | ICD-10-CM | POA: Diagnosis not present

## 2020-08-26 DIAGNOSIS — D509 Iron deficiency anemia, unspecified: Secondary | ICD-10-CM | POA: Diagnosis not present

## 2020-08-26 DIAGNOSIS — N186 End stage renal disease: Secondary | ICD-10-CM | POA: Diagnosis not present

## 2020-08-28 DIAGNOSIS — D509 Iron deficiency anemia, unspecified: Secondary | ICD-10-CM | POA: Diagnosis not present

## 2020-08-28 DIAGNOSIS — Z992 Dependence on renal dialysis: Secondary | ICD-10-CM | POA: Diagnosis not present

## 2020-08-28 DIAGNOSIS — N186 End stage renal disease: Secondary | ICD-10-CM | POA: Diagnosis not present

## 2020-08-28 DIAGNOSIS — D631 Anemia in chronic kidney disease: Secondary | ICD-10-CM | POA: Diagnosis not present

## 2020-08-31 DIAGNOSIS — N186 End stage renal disease: Secondary | ICD-10-CM | POA: Diagnosis not present

## 2020-08-31 DIAGNOSIS — D509 Iron deficiency anemia, unspecified: Secondary | ICD-10-CM | POA: Diagnosis not present

## 2020-08-31 DIAGNOSIS — Z992 Dependence on renal dialysis: Secondary | ICD-10-CM | POA: Diagnosis not present

## 2020-08-31 DIAGNOSIS — D631 Anemia in chronic kidney disease: Secondary | ICD-10-CM | POA: Diagnosis not present

## 2020-09-02 DIAGNOSIS — D509 Iron deficiency anemia, unspecified: Secondary | ICD-10-CM | POA: Diagnosis not present

## 2020-09-02 DIAGNOSIS — Z992 Dependence on renal dialysis: Secondary | ICD-10-CM | POA: Diagnosis not present

## 2020-09-02 DIAGNOSIS — N186 End stage renal disease: Secondary | ICD-10-CM | POA: Diagnosis not present

## 2020-09-02 DIAGNOSIS — D631 Anemia in chronic kidney disease: Secondary | ICD-10-CM | POA: Diagnosis not present

## 2020-09-04 DIAGNOSIS — N186 End stage renal disease: Secondary | ICD-10-CM | POA: Diagnosis not present

## 2020-09-04 DIAGNOSIS — D509 Iron deficiency anemia, unspecified: Secondary | ICD-10-CM | POA: Diagnosis not present

## 2020-09-04 DIAGNOSIS — D631 Anemia in chronic kidney disease: Secondary | ICD-10-CM | POA: Diagnosis not present

## 2020-09-04 DIAGNOSIS — Z992 Dependence on renal dialysis: Secondary | ICD-10-CM | POA: Diagnosis not present

## 2020-09-06 ENCOUNTER — Ambulatory Visit (HOSPITAL_COMMUNITY): Admission: RE | Admit: 2020-09-06 | Payer: Medicare Other | Source: Ambulatory Visit

## 2020-09-07 ENCOUNTER — Ambulatory Visit (HOSPITAL_COMMUNITY): Payer: Medicare Other

## 2020-09-07 DIAGNOSIS — D631 Anemia in chronic kidney disease: Secondary | ICD-10-CM | POA: Diagnosis not present

## 2020-09-07 DIAGNOSIS — N186 End stage renal disease: Secondary | ICD-10-CM | POA: Diagnosis not present

## 2020-09-07 DIAGNOSIS — Z992 Dependence on renal dialysis: Secondary | ICD-10-CM | POA: Diagnosis not present

## 2020-09-07 DIAGNOSIS — D509 Iron deficiency anemia, unspecified: Secondary | ICD-10-CM | POA: Diagnosis not present

## 2020-09-08 ENCOUNTER — Other Ambulatory Visit: Payer: Self-pay

## 2020-09-08 ENCOUNTER — Ambulatory Visit (HOSPITAL_COMMUNITY)
Admission: RE | Admit: 2020-09-08 | Discharge: 2020-09-08 | Disposition: A | Discharge status: Home / Self Care | Payer: Medicare Other | Source: Ambulatory Visit | Attending: Hematology | Admitting: Hematology

## 2020-09-08 DIAGNOSIS — I10 Essential (primary) hypertension: Secondary | ICD-10-CM | POA: Diagnosis not present

## 2020-09-08 DIAGNOSIS — M818 Other osteoporosis without current pathological fracture: Secondary | ICD-10-CM | POA: Diagnosis not present

## 2020-09-08 DIAGNOSIS — K429 Umbilical hernia without obstruction or gangrene: Secondary | ICD-10-CM | POA: Diagnosis not present

## 2020-09-08 DIAGNOSIS — Z86012 Personal history of benign carcinoid tumor: Secondary | ICD-10-CM | POA: Diagnosis not present

## 2020-09-08 DIAGNOSIS — N185 Chronic kidney disease, stage 5: Secondary | ICD-10-CM | POA: Diagnosis not present

## 2020-09-08 DIAGNOSIS — E7849 Other hyperlipidemia: Secondary | ICD-10-CM | POA: Diagnosis not present

## 2020-09-08 DIAGNOSIS — K7689 Other specified diseases of liver: Secondary | ICD-10-CM | POA: Diagnosis not present

## 2020-09-08 DIAGNOSIS — C7A019 Malignant carcinoid tumor of the small intestine, unspecified portion: Secondary | ICD-10-CM

## 2020-09-08 MED ORDER — COPPER CU 64 DOTATATE 1 MCI/ML IV SOLN
4.0000 | Freq: Once | INTRAVENOUS | Status: AC
  Administered 2020-09-08: 4.12 via INTRAVENOUS

## 2020-09-09 DIAGNOSIS — D509 Iron deficiency anemia, unspecified: Secondary | ICD-10-CM | POA: Diagnosis not present

## 2020-09-09 DIAGNOSIS — D631 Anemia in chronic kidney disease: Secondary | ICD-10-CM | POA: Diagnosis not present

## 2020-09-09 DIAGNOSIS — Z992 Dependence on renal dialysis: Secondary | ICD-10-CM | POA: Diagnosis not present

## 2020-09-09 DIAGNOSIS — N186 End stage renal disease: Secondary | ICD-10-CM | POA: Diagnosis not present

## 2020-09-11 DIAGNOSIS — D631 Anemia in chronic kidney disease: Secondary | ICD-10-CM | POA: Diagnosis not present

## 2020-09-11 DIAGNOSIS — D509 Iron deficiency anemia, unspecified: Secondary | ICD-10-CM | POA: Diagnosis not present

## 2020-09-11 DIAGNOSIS — Z992 Dependence on renal dialysis: Secondary | ICD-10-CM | POA: Diagnosis not present

## 2020-09-11 DIAGNOSIS — N186 End stage renal disease: Secondary | ICD-10-CM | POA: Diagnosis not present

## 2020-09-13 ENCOUNTER — Inpatient Hospital Stay (HOSPITAL_COMMUNITY): Payer: Medicare Other

## 2020-09-13 ENCOUNTER — Inpatient Hospital Stay (HOSPITAL_COMMUNITY): Payer: Medicare Other | Attending: Hematology | Admitting: Hematology

## 2020-09-13 ENCOUNTER — Other Ambulatory Visit: Payer: Self-pay

## 2020-09-13 VITALS — BP 116/61 | HR 66 | Temp 97.0°F | Resp 18 | Wt 192.4 lb

## 2020-09-13 DIAGNOSIS — C7B04 Secondary carcinoid tumors of peritoneum: Secondary | ICD-10-CM | POA: Insufficient documentation

## 2020-09-13 DIAGNOSIS — C7A00 Malignant carcinoid tumor of unspecified site: Secondary | ICD-10-CM | POA: Insufficient documentation

## 2020-09-13 DIAGNOSIS — C7A019 Malignant carcinoid tumor of the small intestine, unspecified portion: Secondary | ICD-10-CM | POA: Diagnosis not present

## 2020-09-13 DIAGNOSIS — C7B02 Secondary carcinoid tumors of liver: Secondary | ICD-10-CM | POA: Diagnosis not present

## 2020-09-13 DIAGNOSIS — N186 End stage renal disease: Secondary | ICD-10-CM | POA: Diagnosis not present

## 2020-09-13 DIAGNOSIS — Z992 Dependence on renal dialysis: Secondary | ICD-10-CM | POA: Diagnosis not present

## 2020-09-13 LAB — COMPREHENSIVE METABOLIC PANEL
ALT: 17 U/L (ref 0–44)
AST: 30 U/L (ref 15–41)
Albumin: 3.2 g/dL — ABNORMAL LOW (ref 3.5–5.0)
Alkaline Phosphatase: 62 U/L (ref 38–126)
Anion gap: 12 (ref 5–15)
BUN: 28 mg/dL — ABNORMAL HIGH (ref 8–23)
CO2: 29 mmol/L (ref 22–32)
Calcium: 8.7 mg/dL — ABNORMAL LOW (ref 8.9–10.3)
Chloride: 98 mmol/L (ref 98–111)
Creatinine, Ser: 7.33 mg/dL — ABNORMAL HIGH (ref 0.61–1.24)
GFR, Estimated: 7 mL/min — ABNORMAL LOW (ref >60)
Glucose, Bld: 125 mg/dL — ABNORMAL HIGH (ref 70–99)
Potassium: 3.6 mmol/L (ref 3.5–5.1)
Sodium: 139 mmol/L (ref 135–145)
Total Bilirubin: 0.6 mg/dL (ref 0.3–1.2)
Total Protein: 5.7 g/dL — ABNORMAL LOW (ref 6.5–8.1)

## 2020-09-13 LAB — LACTATE DEHYDROGENASE: LDH: 155 U/L (ref 98–192)

## 2020-09-13 LAB — CBC WITH DIFFERENTIAL/PLATELET
Abs Immature Granulocytes: 0.01 10*3/uL (ref 0.00–0.07)
Basophils Absolute: 0 10*3/uL (ref 0.0–0.1)
Basophils Relative: 1 %
Eosinophils Absolute: 0.1 10*3/uL (ref 0.0–0.5)
Eosinophils Relative: 3 %
HCT: 36 % — ABNORMAL LOW (ref 39.0–52.0)
Hemoglobin: 10.7 g/dL — ABNORMAL LOW (ref 13.0–17.0)
Immature Granulocytes: 0 %
Lymphocytes Relative: 9 %
Lymphs Abs: 0.3 10*3/uL — ABNORMAL LOW (ref 0.7–4.0)
MCH: 26.6 pg (ref 26.0–34.0)
MCHC: 29.7 g/dL — ABNORMAL LOW (ref 30.0–36.0)
MCV: 89.3 fL (ref 80.0–100.0)
Monocytes Absolute: 0.3 10*3/uL (ref 0.1–1.0)
Monocytes Relative: 8 %
Neutro Abs: 3 10*3/uL (ref 1.7–7.7)
Neutrophils Relative %: 79 %
Platelets: 122 10*3/uL — ABNORMAL LOW (ref 150–400)
RBC: 4.03 MIL/uL — ABNORMAL LOW (ref 4.22–5.81)
RDW: 14.8 % (ref 11.5–15.5)
WBC: 3.8 10*3/uL — ABNORMAL LOW (ref 4.0–10.5)
nRBC: 0 % (ref 0.0–0.2)

## 2020-09-13 MED ORDER — OCTREOTIDE ACETATE 30 MG IM KIT
30.0000 mg | PACK | Freq: Once | INTRAMUSCULAR | Status: AC
  Administered 2020-09-13: 30 mg via INTRAMUSCULAR

## 2020-09-13 NOTE — Patient Instructions (Signed)
[place] at [facility] Discharge Instructions  You were seen today by Dr. Delton Coombes. He went over your recent results and scans. You received your Sandostatin injection today; continue getting it every month. Dr. Delton Coombes will see you back in 2 months for labs and follow up.   Thank you for choosing [place] at [facility] to provide your oncology and hematology care.  To afford each patient quality time with our provider, please arrive at least 15 minutes before your scheduled appointment time.   If you have a lab appointment with the [facility] please come in thru the Main Entrance and check in at the main information desk  You need to re-schedule your appointment should you arrive 10 or more minutes late.  We strive to give you quality time with our providers, and arriving late affects you and other patients whose appointments are after yours.  Also, if you no show three or more times for appointments you may be dismissed from the clinic at the providers discretion.     Again, thank you for choosing [facility].  Our hope is that these requests will decrease the amount of time that you wait before being seen by our physicians.       _____________________________________________________________  Should you have questions after your visit to [facility], please contact our office at [phone] between the hours of 8:00 a.m. and 4:30 p.m.  Voicemails left after 4:00 p.m. will not be returned until the following business day.  For prescription refill requests, have your pharmacy contact our office and allow 72 hours.    Cancer Center Support Programs:   > Cancer Support Group  2nd Tuesday of the month 1pm-2pm, Journey Room

## 2020-09-13 NOTE — Progress Notes (Signed)
[name], [place] [address]   CLINIC:  Medical Oncology/Hematology  PCP:  [name], MD [place] / [place] [address] [phone]   REASON FOR VISIT:  Follow-up for metastatic carcinoid tumor  PRIOR THERAPY: None  NGS Results: Not done  CURRENT THERAPY: Everolimus daily & Sandostatin monthly  BRIEF ONCOLOGIC HISTORY:  Oncology History   No history exists.    CANCER STAGING: Cancer Staging No matching staging information was found for the patient.  INTERVAL HISTORY:  [name], a 80 y.o. male, returns for routine follow-up of his metastatic carcinoid tumor. Allen Davenport was last seen on 07/19/2020.   Today he reports feeling well. He denies having any N/V/D, abdominal cramping, wheezing, or skin flushing. He is tolerating everolimus well and denies having mouth sores. His appetite is okay. He continues going to HD 3 days a week.   REVIEW OF SYSTEMS:  Review of Systems  Constitutional: Positive for appetite change (75%) and fatigue (75%).  HENT:   Negative for mouth sores.   Respiratory: Negative for wheezing.   Gastrointestinal: Negative for abdominal pain, diarrhea, nausea and vomiting.  Skin: Negative for rash.  All other systems reviewed and are negative.   PAST MEDICAL/SURGICAL HISTORY:  Past Medical History:  Diagnosis Date  . Anemia   . Cancer [facility])    right renal . Prostate- Radiation treatment  . CHF (congestive heart failure) ([place])   . Chronic kidney disease    Dialysis T/TH/Sa  . Edema   . Heart murmur    "nothing to worry about"  . Hyperlipidemia   . Hypertension   . Malignant carcinoid tumor ([place]) 01/03/2016  . Pneumonia    as a child   Past Surgical History:  Procedure Laterality Date  . AV FISTULA PLACEMENT Left 10/20/2014   Procedure: Left Arm ARTERIOVENOUS (AV) FISTULA CREATION;  Surgeon: Elam Dutch, MD;  Location: [facility];  Service: Vascular;  Laterality: Left;  .  NEPHRECTOMY Right 2010  . PERIPHERAL VASCULAR BALLOON ANGIOPLASTY  06/20/2019   Procedure: PERIPHERAL VASCULAR BALLOON ANGIOPLASTY;  Surgeon: Elam Dutch, MD;  Location: [facility];  Service: Cardiovascular;;  . REVISON OF ARTERIOVENOUS FISTULA Left 08/04/2019   Procedure: REVISON OF ARTERIOVENOUS FISTULA WITH SIDE BRANCH LIGATION;  Surgeon: Elam Dutch, MD;  Location: [facility] OR;  Service: Vascular;  Laterality: Left;    SOCIAL HISTORY:  Social History   Socioeconomic History  . Marital status: Married    Spouse name: Not on file  . Number of children: Not on file  . Years of education: Not on file  . Highest education level: Not on file  Occupational History  . Not on file  Tobacco Use  . Smoking status: Never Smoker  . Smokeless tobacco: Never Used  Vaping Use  . Vaping Use: Never used  Substance and Sexual Activity  . Alcohol use: No    Alcohol/week: 0.0 standard drinks  . Drug use: No  . Sexual activity: Not on file  Other Topics Concern  . Not on file  Social History Narrative  . Not on file   Social Determinants of Health   Financial Resource Strain: Low Risk   . Difficulty of Paying Living Expenses: Not hard at all  Food Insecurity: No Food Insecurity  . Worried About Charity fundraiser in the Last Year: Never true  . Ran Out of Food in the Last Year: Never true  Transportation Needs: No Transportation Needs  . Lack of Transportation (Medical): No  . Lack of Transportation (Non-Medical): No  Physical Activity: Insufficiently Active  . Days of Exercise per Week: 7 days  . Minutes of Exercise per Session: 10 min  Stress: No Stress Concern Present  . Feeling of Stress : Not at all  Social Connections: Moderately Integrated  . Frequency of Communication with Friends and Family: More than three times a week  . Frequency of Social Gatherings with Friends and Family: Once a week  . Attends Religious Services: More than 4 times per year  . Active  Member of Clubs or Organizations: No  . Attends Archivist Meetings: Never  . Marital Status: Married  Human resources officer Violence: Not At Risk  . Fear of Current or Ex-Partner: No  . Emotionally Abused: No  . Physically Abused: No  . Sexually Abused: No    FAMILY HISTORY:  Family History  Problem Relation Age of Onset  . Cancer Mother   . Hypertension Father   . Cancer Sister     CURRENT MEDICATIONS:  Current Outpatient Medications  Medication Sig Dispense Refill  . amLODipine (NORVASC) 10 MG tablet Take 10 mg by mouth daily.    [name] atorvastatin (LIPITOR) 80 MG tablet Take 80 mg by mouth daily at 6 PM.     . calcitRIOL (ROCALTROL) 0.25 MCG capsule Take 0.25 mcg by mouth daily.     . calcium acetate (PHOSLO) 667 MG capsule Take by mouth.    . carvedilol (COREG) 3.125 MG tablet Take 3.125 mg by mouth 2 (two) times daily with a meal.     . everolimus (AFINITOR) 5 MG tablet Take 1 tablet (5 mg total) by mouth daily. 28 tablet 6  . furosemide (LASIX) 40 MG tablet Take 40 mg by mouth every morning.    . iron polysaccharides (NIFEREX) 150 MG capsule Take 150 mg by mouth daily.     . metolazone (ZAROXOLYN) 5 MG tablet Take 5 mg by mouth every other day.    [name] octreotide (SANDOSTATIN LAR) 30 MG injection Inject 30 mg into the muscle every 28 (twenty-eight) days.     . Omega-3 Fatty Acids (FISH OIL) 1000 MG CAPS Take 1,000 mg by mouth daily.     [name] pyridOXINE (B-6) 50 MG tablet Take 1 tablet (50 mg total) by mouth daily. 30 tablet 8  . sodium bicarbonate 650 MG tablet Take 650 mg by mouth daily.    . tamsulosin (FLOMAX) 0.4 MG CAPS capsule Take 0.4 mg by mouth daily.      No current facility-administered medications for this visit.    ALLERGIES:  No Known Allergies  PHYSICAL EXAM:  Performance status (ECOG): 1 - Symptomatic but completely ambulatory  Vitals:   09/13/20 1047  BP: 116/61  Pulse: 66  Resp: 18  Temp: (!) 97 F (36.1 C)  SpO2: 99%   Wt Readings from Last 3  Encounters:  09/13/20 192 lb 6.4 oz (87.3 kg)  08/16/20 196 lb (88.9 kg)  07/19/20 199 lb (90.3 kg)   Physical Exam Vitals reviewed.  Constitutional:      Appearance: Normal appearance.  Cardiovascular:     Rate and Rhythm: Normal rate and regular rhythm.     Pulses: Normal pulses.     Heart sounds: Normal heart sounds.  Pulmonary:     Effort: Pulmonary effort is normal.     Breath sounds: Normal breath sounds.  Musculoskeletal:     Right lower  leg: Edema (1+) present.     Left lower leg: Edema (1+) present.  Neurological:     General: No focal deficit present.     Mental Status: He is alert and oriented to person, place, and time.  Psychiatric:        Mood and Affect: Mood normal.        Behavior: Behavior normal.      LABORATORY DATA:  I have reviewed the labs as listed.  CBC Latest Ref Rng & Units 09/13/2020 07/19/2020 05/24/2020  WBC 4.0 - 10.5 K/uL 3.8(L) 5.3 4.9  Hemoglobin 13.0 - 17.0 g/dL 10.7(L) 9.2(L) 10.0(L)  Hematocrit 39.0 - 52.0 % 36.0(L) 29.8(L) 32.1(L)  Platelets 150 - 400 K/uL 122(L) 162 155   CMP Latest Ref Rng & Units 09/13/2020 07/19/2020 05/24/2020  Glucose 70 - 99 mg/dL 125(H) 170(H) 87  BUN 8 - 23 mg/dL 28(H) 38(H) 39(H)  Creatinine 0.61 - 1.24 mg/dL 7.33(H) 8.34(H) 8.44(H)  Sodium 135 - 145 mmol/L 139 137 136  Potassium 3.5 - 5.1 mmol/L 3.6 4.3 3.3(L)  Chloride 98 - 111 mmol/L 98 98 96(L)  CO2 22 - 32 mmol/L 29 26 27   Calcium 8.9 - 10.3 mg/dL 8.7(L) 8.5(L) 7.8(L)  Total Protein 6.5 - 8.1 g/dL 5.7(L) 6.2(L) 6.3(L)  Total Bilirubin 0.3 - 1.2 mg/dL 0.6 0.6 0.9  Alkaline Phos 38 - 126 U/L 62 66 54  AST 15 - 41 U/L 30 35 34  ALT 0 - 44 U/L 17 18 19     DIAGNOSTIC IMAGING:  I have independently reviewed the scans and discussed with the patient. NM PET (CU-64 DETECTNET)SKULL TO MID THIGH  Result Date: 09/08/2020 CLINICAL DATA:  Initial treatment strategy for carcinoid tumor. Neuroendocrine tumor of the small bowel. EXAM: NUCLEAR MEDICINE PET SKULL BASE  TO THIGH TECHNIQUE: 4.1 mCi copper 56 DOTATATE was injected intravenously. Full-ring PET imaging was performed from the skull base to thigh after the radiotracer. CT data was obtained and used for attenuation correction and anatomic localization. COMPARISON:  None. FINDINGS: NECK No radiotracer activity in neck lymph nodes. Incidental CT findings: None CHEST No radiotracer accumulation within mediastinal or hilar lymph nodes. No suspicious pulmonary nodules on the CT scan. Interval resolution of the RIGHT lower lobe pulmonary nodule described on comparison exam. Incidental CT finding:None ABDOMEN/PELVIS There several nodules over the dome of the liver which intensely radiotracer avid. These appear to be in liver parenchyma but immediately beneath the diaphragm an ddifficult to separate from the RIGHT lower lobe. There is significant misregistration between the CT imaging and the PET data set related to patient breathing. These RIGHT hepatic dome nodules are intensely radiotracer avid with SUV max equal 15.5 compared SUV max equal 30.8 on comparison exam. Several additional focal radiotracer avid lesions scattered throughout the LEFT and RIGHT hepatic lobe. For example, inferior RIGHT hepatic lobe lesion with SUV max equal 19.8 on image 111 compares to SUV max equal 27. These lesions are difficult to define on the noncontrast CT. No new lesions are evident within the liver. Within the central mesentery again demonstrated nodular lesions with stippled calcifications. For example lesion measuring 3.8 x 1.9 cm on image 130 compares to 3.5 x 2.0 cm. Lesion remains intensely radiotracer avid with SUV max equal 25 compared SUV max equal 35. Several smaller peritoneal nodules are again noted. Nodule along the LEFT common iliac vessels with SUV max equal 18.5 compared SUV max equal 28.9. Lesion has a maximum dimension of 16 mm compared to 16 mm.  Intense radiotracer activity in a umbilical hernia sac with SUV max equal 18.3.  Nodular thickening is unchanged. Intense radiotracer activity associated with the prostate gland presumed peritoneal nodule metastasis. Physiologic activity noted in the liver, spleen, adrenal glands and kidneys. Incidental CT findings:None SKELETON No focal activity to suggest skeletal metastasis. Incidental CT findings:None IMPRESSION: 1. No change in multifocal neuroendocrine tumor hepatic metastasis. 2. No change in multifocal mesenteric and peritoneal nodular metastatic well differentiated neuroendocrine tumor. Multiple sites in the peritoneal space, posterior cul-de-sac and central mesentery. 3. Resolution of pulmonary nodule at the RIGHT lung base. This was not radiotracer avid on comparison DOTATATE PET scan. 4. No evidence of new metastatic disease. Electronically Signed   By: Suzy Bouchard M.D.   On: 09/08/2020 14:13     ASSESSMENT:  1. 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 Everolimus 5 mg daily started on 05/17/2018. -PET scan on 10/29/2019 showed multifocal well-differentiated neuroendocrine tumor with intense avid lesions in the liver, mesentery, peritoneal space. No clear increase in size of the lesions. Many lesions have increased in radiotracer activity. -PET scan on 09/08/2020 showed no change in multifocal neuroendocrine tumor hepatic metastasis with no changes in multifocal mesenteric and peritoneal nodule or metastatic tumors.  Resolution of the right lung base pulmonary nodule.  No evidence of new metastatic disease.  2. Normocytic anemia: -This is from combination of end-stage renal disease and relative iron deficiency and myelosuppression from everolimus.  3. ESRD on HD: -Started on HD on 05/20/2019.   PLAN:  1. Malignant carcinoid tumor to the liver and peritoneum: -We have reviewed results of the detectnet PET CT scan which showed stable disease with no new sites of metastatic disease. -Reviewed his labs  which showed normal chemistries.  CBC shows white count 3.8 with normal ANC.  Platelet count is mildly low at 122, likely from everolimus.  Last chromogranin was 606. -He is tolerating everolimus 5 mg daily very well.  No mucositis or diarrhea.  No dose modifications needed. -Continue monthly lanreotide injections. -RTC 2 months for follow-up.  2. Normocytic anemia: -Hemoglobin has improved to 10.7.  Continue Retacrit with dialysis.  3. ESRD on HD: -Continue HD on Tuesday, Thursday and Saturday.   Orders placed this encounter:  No orders of the defined types were placed in this encounter.    Derek Jack, MD [place] [phone]   I, Milinda Antis, am acting as a scribe for Dr. Sanda Linger.  I, Derek Jack MD, have reviewed the above documentation for accuracy and completeness, and I agree with the above.

## 2020-09-13 NOTE — Progress Notes (Signed)
Patient tolerated Sandostatin injection with no complaints voiced.  See MAR for details.  Site clean and dry with no bruising or swelling noted at site.  Band aid applied.  Vss with discharge and left in satisfactory condition with no s/s of distress noted.

## 2020-09-14 DIAGNOSIS — N186 End stage renal disease: Secondary | ICD-10-CM | POA: Diagnosis not present

## 2020-09-14 DIAGNOSIS — Z992 Dependence on renal dialysis: Secondary | ICD-10-CM | POA: Diagnosis not present

## 2020-09-14 DIAGNOSIS — D631 Anemia in chronic kidney disease: Secondary | ICD-10-CM | POA: Diagnosis not present

## 2020-09-14 DIAGNOSIS — D509 Iron deficiency anemia, unspecified: Secondary | ICD-10-CM | POA: Diagnosis not present

## 2020-09-14 LAB — CHROMOGRANIN A: Chromogranin A (ng/mL): 292.5 ng/mL — ABNORMAL HIGH (ref 0.0–101.8)

## 2020-09-16 DIAGNOSIS — D631 Anemia in chronic kidney disease: Secondary | ICD-10-CM | POA: Diagnosis not present

## 2020-09-16 DIAGNOSIS — D509 Iron deficiency anemia, unspecified: Secondary | ICD-10-CM | POA: Diagnosis not present

## 2020-09-16 DIAGNOSIS — Z992 Dependence on renal dialysis: Secondary | ICD-10-CM | POA: Diagnosis not present

## 2020-09-16 DIAGNOSIS — N186 End stage renal disease: Secondary | ICD-10-CM | POA: Diagnosis not present

## 2020-09-18 DIAGNOSIS — Z992 Dependence on renal dialysis: Secondary | ICD-10-CM | POA: Diagnosis not present

## 2020-09-18 DIAGNOSIS — D631 Anemia in chronic kidney disease: Secondary | ICD-10-CM | POA: Diagnosis not present

## 2020-09-18 DIAGNOSIS — D509 Iron deficiency anemia, unspecified: Secondary | ICD-10-CM | POA: Diagnosis not present

## 2020-09-18 DIAGNOSIS — N186 End stage renal disease: Secondary | ICD-10-CM | POA: Diagnosis not present

## 2020-09-21 DIAGNOSIS — N186 End stage renal disease: Secondary | ICD-10-CM | POA: Diagnosis not present

## 2020-09-21 DIAGNOSIS — D509 Iron deficiency anemia, unspecified: Secondary | ICD-10-CM | POA: Diagnosis not present

## 2020-09-21 DIAGNOSIS — D631 Anemia in chronic kidney disease: Secondary | ICD-10-CM | POA: Diagnosis not present

## 2020-09-21 DIAGNOSIS — Z992 Dependence on renal dialysis: Secondary | ICD-10-CM | POA: Diagnosis not present

## 2020-09-23 DIAGNOSIS — D509 Iron deficiency anemia, unspecified: Secondary | ICD-10-CM | POA: Diagnosis not present

## 2020-09-23 DIAGNOSIS — D631 Anemia in chronic kidney disease: Secondary | ICD-10-CM | POA: Diagnosis not present

## 2020-09-23 DIAGNOSIS — N186 End stage renal disease: Secondary | ICD-10-CM | POA: Diagnosis not present

## 2020-09-23 DIAGNOSIS — Z992 Dependence on renal dialysis: Secondary | ICD-10-CM | POA: Diagnosis not present

## 2020-09-25 DIAGNOSIS — Z992 Dependence on renal dialysis: Secondary | ICD-10-CM | POA: Diagnosis not present

## 2020-09-25 DIAGNOSIS — N186 End stage renal disease: Secondary | ICD-10-CM | POA: Diagnosis not present

## 2020-09-25 DIAGNOSIS — D509 Iron deficiency anemia, unspecified: Secondary | ICD-10-CM | POA: Diagnosis not present

## 2020-09-25 DIAGNOSIS — D631 Anemia in chronic kidney disease: Secondary | ICD-10-CM | POA: Diagnosis not present

## 2020-09-28 DIAGNOSIS — N186 End stage renal disease: Secondary | ICD-10-CM | POA: Diagnosis not present

## 2020-09-28 DIAGNOSIS — D509 Iron deficiency anemia, unspecified: Secondary | ICD-10-CM | POA: Diagnosis not present

## 2020-09-28 DIAGNOSIS — Z992 Dependence on renal dialysis: Secondary | ICD-10-CM | POA: Diagnosis not present

## 2020-09-28 DIAGNOSIS — D631 Anemia in chronic kidney disease: Secondary | ICD-10-CM | POA: Diagnosis not present

## 2020-09-30 DIAGNOSIS — Z992 Dependence on renal dialysis: Secondary | ICD-10-CM | POA: Diagnosis not present

## 2020-09-30 DIAGNOSIS — D509 Iron deficiency anemia, unspecified: Secondary | ICD-10-CM | POA: Diagnosis not present

## 2020-09-30 DIAGNOSIS — N186 End stage renal disease: Secondary | ICD-10-CM | POA: Diagnosis not present

## 2020-09-30 DIAGNOSIS — D631 Anemia in chronic kidney disease: Secondary | ICD-10-CM | POA: Diagnosis not present

## 2020-10-02 DIAGNOSIS — D631 Anemia in chronic kidney disease: Secondary | ICD-10-CM | POA: Diagnosis not present

## 2020-10-02 DIAGNOSIS — Z992 Dependence on renal dialysis: Secondary | ICD-10-CM | POA: Diagnosis not present

## 2020-10-02 DIAGNOSIS — D509 Iron deficiency anemia, unspecified: Secondary | ICD-10-CM | POA: Diagnosis not present

## 2020-10-02 DIAGNOSIS — N186 End stage renal disease: Secondary | ICD-10-CM | POA: Diagnosis not present

## 2020-10-04 DIAGNOSIS — D631 Anemia in chronic kidney disease: Secondary | ICD-10-CM | POA: Diagnosis not present

## 2020-10-04 DIAGNOSIS — N186 End stage renal disease: Secondary | ICD-10-CM | POA: Diagnosis not present

## 2020-10-04 DIAGNOSIS — D509 Iron deficiency anemia, unspecified: Secondary | ICD-10-CM | POA: Diagnosis not present

## 2020-10-04 DIAGNOSIS — Z992 Dependence on renal dialysis: Secondary | ICD-10-CM | POA: Diagnosis not present

## 2020-10-05 DIAGNOSIS — D631 Anemia in chronic kidney disease: Secondary | ICD-10-CM | POA: Diagnosis not present

## 2020-10-05 DIAGNOSIS — Z992 Dependence on renal dialysis: Secondary | ICD-10-CM | POA: Diagnosis not present

## 2020-10-05 DIAGNOSIS — N186 End stage renal disease: Secondary | ICD-10-CM | POA: Diagnosis not present

## 2020-10-05 DIAGNOSIS — D509 Iron deficiency anemia, unspecified: Secondary | ICD-10-CM | POA: Diagnosis not present

## 2020-10-07 DIAGNOSIS — Z992 Dependence on renal dialysis: Secondary | ICD-10-CM | POA: Diagnosis not present

## 2020-10-07 DIAGNOSIS — N186 End stage renal disease: Secondary | ICD-10-CM | POA: Diagnosis not present

## 2020-10-07 DIAGNOSIS — D631 Anemia in chronic kidney disease: Secondary | ICD-10-CM | POA: Diagnosis not present

## 2020-10-07 DIAGNOSIS — D509 Iron deficiency anemia, unspecified: Secondary | ICD-10-CM | POA: Diagnosis not present

## 2020-10-09 DIAGNOSIS — D631 Anemia in chronic kidney disease: Secondary | ICD-10-CM | POA: Diagnosis not present

## 2020-10-09 DIAGNOSIS — Z992 Dependence on renal dialysis: Secondary | ICD-10-CM | POA: Diagnosis not present

## 2020-10-09 DIAGNOSIS — D509 Iron deficiency anemia, unspecified: Secondary | ICD-10-CM | POA: Diagnosis not present

## 2020-10-09 DIAGNOSIS — N186 End stage renal disease: Secondary | ICD-10-CM | POA: Diagnosis not present

## 2020-10-11 ENCOUNTER — Encounter (HOSPITAL_COMMUNITY): Payer: Self-pay

## 2020-10-11 ENCOUNTER — Other Ambulatory Visit: Payer: Self-pay

## 2020-10-11 ENCOUNTER — Inpatient Hospital Stay (HOSPITAL_COMMUNITY): Payer: Medicare Other | Attending: Hematology

## 2020-10-11 VITALS — BP 123/75 | HR 64 | Temp 97.0°F | Resp 18 | Wt 191.0 lb

## 2020-10-11 DIAGNOSIS — E1140 Type 2 diabetes mellitus with diabetic neuropathy, unspecified: Secondary | ICD-10-CM | POA: Diagnosis not present

## 2020-10-11 DIAGNOSIS — C7B02 Secondary carcinoid tumors of liver: Secondary | ICD-10-CM | POA: Diagnosis not present

## 2020-10-11 DIAGNOSIS — C7A00 Malignant carcinoid tumor of unspecified site: Secondary | ICD-10-CM | POA: Diagnosis not present

## 2020-10-11 DIAGNOSIS — C7B04 Secondary carcinoid tumors of peritoneum: Secondary | ICD-10-CM | POA: Diagnosis not present

## 2020-10-11 DIAGNOSIS — E1151 Type 2 diabetes mellitus with diabetic peripheral angiopathy without gangrene: Secondary | ICD-10-CM | POA: Diagnosis not present

## 2020-10-11 MED ORDER — OCTREOTIDE ACETATE 30 MG IM KIT
30.0000 mg | PACK | Freq: Once | INTRAMUSCULAR | Status: AC
  Administered 2020-10-11: 30 mg via INTRAMUSCULAR

## 2020-10-11 NOTE — Patient Instructions (Signed)
[facility] at [facility]  Discharge Instructions:  Sandostatin today.  Follow up as scheduled.  Please call the clinic if you have any questions or concerns.  _______________________________________________________________  Thank you for choosing [facility] at [facility] to provide your oncology and hematology care.  To afford each patient quality time with our providers, please arrive at least 15 minutes before your scheduled appointment.  You need to re-schedule your appointment if you arrive 10 or more minutes late.  We strive to give you quality time with our providers, and arriving late affects you and other patients whose appointments are after yours.  Also, if you no show three or more times for appointments you may be dismissed from the clinic.  Again, thank you for choosing [facility] at  Hospital. Our hope is that these requests will allow you access to exceptional care and in a timely manner. _______________________________________________________________  If you have questions after your visit, please contact our office at [phone] between the hours of 8:30 a.m. and 5:00 p.m. Voicemails left after 4:30 p.m. will not be returned until the following business day. _______________________________________________________________  For prescription refill requests, have your pharmacy contact our office. _______________________________________________________________  Recommendations made by the consultant and any test results will be sent to your referring physician. _______________________________________________________________ 

## 2020-10-11 NOTE — Progress Notes (Signed)
Allen Davenport presents today for injection per the provider's orders.  Sandostatin administration without incident; injection site WNL; see MAR for injection details.  Patient tolerated procedure well and without incident.  No questions or complaints noted at this time.  Pt stable during and after injection. Pt discharged in stable condition ambulatory.

## 2020-10-12 DIAGNOSIS — N186 End stage renal disease: Secondary | ICD-10-CM | POA: Diagnosis not present

## 2020-10-12 DIAGNOSIS — D631 Anemia in chronic kidney disease: Secondary | ICD-10-CM | POA: Diagnosis not present

## 2020-10-12 DIAGNOSIS — D509 Iron deficiency anemia, unspecified: Secondary | ICD-10-CM | POA: Diagnosis not present

## 2020-10-12 DIAGNOSIS — Z992 Dependence on renal dialysis: Secondary | ICD-10-CM | POA: Diagnosis not present

## 2020-10-14 DIAGNOSIS — Z992 Dependence on renal dialysis: Secondary | ICD-10-CM | POA: Diagnosis not present

## 2020-10-14 DIAGNOSIS — D631 Anemia in chronic kidney disease: Secondary | ICD-10-CM | POA: Diagnosis not present

## 2020-10-14 DIAGNOSIS — D509 Iron deficiency anemia, unspecified: Secondary | ICD-10-CM | POA: Diagnosis not present

## 2020-10-14 DIAGNOSIS — N186 End stage renal disease: Secondary | ICD-10-CM | POA: Diagnosis not present

## 2020-10-16 DIAGNOSIS — N250 Renal osteodystrophy: Secondary | ICD-10-CM | POA: Diagnosis not present

## 2020-10-16 DIAGNOSIS — E559 Vitamin D deficiency, unspecified: Secondary | ICD-10-CM | POA: Diagnosis not present

## 2020-10-16 DIAGNOSIS — D509 Iron deficiency anemia, unspecified: Secondary | ICD-10-CM | POA: Diagnosis not present

## 2020-10-16 DIAGNOSIS — D631 Anemia in chronic kidney disease: Secondary | ICD-10-CM | POA: Diagnosis not present

## 2020-10-16 DIAGNOSIS — Z992 Dependence on renal dialysis: Secondary | ICD-10-CM | POA: Diagnosis not present

## 2020-10-16 DIAGNOSIS — N186 End stage renal disease: Secondary | ICD-10-CM | POA: Diagnosis not present

## 2020-10-19 DIAGNOSIS — E559 Vitamin D deficiency, unspecified: Secondary | ICD-10-CM | POA: Diagnosis not present

## 2020-10-19 DIAGNOSIS — N250 Renal osteodystrophy: Secondary | ICD-10-CM | POA: Diagnosis not present

## 2020-10-19 DIAGNOSIS — D509 Iron deficiency anemia, unspecified: Secondary | ICD-10-CM | POA: Diagnosis not present

## 2020-10-19 DIAGNOSIS — Z992 Dependence on renal dialysis: Secondary | ICD-10-CM | POA: Diagnosis not present

## 2020-10-19 DIAGNOSIS — N186 End stage renal disease: Secondary | ICD-10-CM | POA: Diagnosis not present

## 2020-10-19 DIAGNOSIS — D631 Anemia in chronic kidney disease: Secondary | ICD-10-CM | POA: Diagnosis not present

## 2020-10-21 DIAGNOSIS — N250 Renal osteodystrophy: Secondary | ICD-10-CM | POA: Diagnosis not present

## 2020-10-21 DIAGNOSIS — Z992 Dependence on renal dialysis: Secondary | ICD-10-CM | POA: Diagnosis not present

## 2020-10-21 DIAGNOSIS — D631 Anemia in chronic kidney disease: Secondary | ICD-10-CM | POA: Diagnosis not present

## 2020-10-21 DIAGNOSIS — E559 Vitamin D deficiency, unspecified: Secondary | ICD-10-CM | POA: Diagnosis not present

## 2020-10-21 DIAGNOSIS — D509 Iron deficiency anemia, unspecified: Secondary | ICD-10-CM | POA: Diagnosis not present

## 2020-10-21 DIAGNOSIS — N186 End stage renal disease: Secondary | ICD-10-CM | POA: Diagnosis not present

## 2020-10-23 DIAGNOSIS — D631 Anemia in chronic kidney disease: Secondary | ICD-10-CM | POA: Diagnosis not present

## 2020-10-23 DIAGNOSIS — N186 End stage renal disease: Secondary | ICD-10-CM | POA: Diagnosis not present

## 2020-10-23 DIAGNOSIS — D509 Iron deficiency anemia, unspecified: Secondary | ICD-10-CM | POA: Diagnosis not present

## 2020-10-23 DIAGNOSIS — Z992 Dependence on renal dialysis: Secondary | ICD-10-CM | POA: Diagnosis not present

## 2020-10-23 DIAGNOSIS — N250 Renal osteodystrophy: Secondary | ICD-10-CM | POA: Diagnosis not present

## 2020-10-23 DIAGNOSIS — E559 Vitamin D deficiency, unspecified: Secondary | ICD-10-CM | POA: Diagnosis not present

## 2020-10-25 ENCOUNTER — Other Ambulatory Visit (HOSPITAL_COMMUNITY): Payer: Self-pay | Admitting: Surgery

## 2020-10-25 DIAGNOSIS — C7A019 Malignant carcinoid tumor of the small intestine, unspecified portion: Secondary | ICD-10-CM

## 2020-10-26 DIAGNOSIS — N250 Renal osteodystrophy: Secondary | ICD-10-CM | POA: Diagnosis not present

## 2020-10-26 DIAGNOSIS — N186 End stage renal disease: Secondary | ICD-10-CM | POA: Diagnosis not present

## 2020-10-26 DIAGNOSIS — E559 Vitamin D deficiency, unspecified: Secondary | ICD-10-CM | POA: Diagnosis not present

## 2020-10-26 DIAGNOSIS — D509 Iron deficiency anemia, unspecified: Secondary | ICD-10-CM | POA: Diagnosis not present

## 2020-10-26 DIAGNOSIS — Z992 Dependence on renal dialysis: Secondary | ICD-10-CM | POA: Diagnosis not present

## 2020-10-26 DIAGNOSIS — D631 Anemia in chronic kidney disease: Secondary | ICD-10-CM | POA: Diagnosis not present

## 2020-10-26 MED ORDER — EVEROLIMUS 5 MG PO TABS: 5.0000 mg | ORAL_TABLET | Freq: Every day | ORAL | 6 refills | Status: DC

## 2020-10-28 DIAGNOSIS — E559 Vitamin D deficiency, unspecified: Secondary | ICD-10-CM | POA: Diagnosis not present

## 2020-10-28 DIAGNOSIS — N186 End stage renal disease: Secondary | ICD-10-CM | POA: Diagnosis not present

## 2020-10-28 DIAGNOSIS — Z992 Dependence on renal dialysis: Secondary | ICD-10-CM | POA: Diagnosis not present

## 2020-10-28 DIAGNOSIS — D631 Anemia in chronic kidney disease: Secondary | ICD-10-CM | POA: Diagnosis not present

## 2020-10-28 DIAGNOSIS — D509 Iron deficiency anemia, unspecified: Secondary | ICD-10-CM | POA: Diagnosis not present

## 2020-10-28 DIAGNOSIS — N250 Renal osteodystrophy: Secondary | ICD-10-CM | POA: Diagnosis not present

## 2020-10-30 DIAGNOSIS — N186 End stage renal disease: Secondary | ICD-10-CM | POA: Diagnosis not present

## 2020-10-30 DIAGNOSIS — D509 Iron deficiency anemia, unspecified: Secondary | ICD-10-CM | POA: Diagnosis not present

## 2020-10-30 DIAGNOSIS — Z992 Dependence on renal dialysis: Secondary | ICD-10-CM | POA: Diagnosis not present

## 2020-10-30 DIAGNOSIS — N250 Renal osteodystrophy: Secondary | ICD-10-CM | POA: Diagnosis not present

## 2020-10-30 DIAGNOSIS — D631 Anemia in chronic kidney disease: Secondary | ICD-10-CM | POA: Diagnosis not present

## 2020-10-30 DIAGNOSIS — E559 Vitamin D deficiency, unspecified: Secondary | ICD-10-CM | POA: Diagnosis not present

## 2020-11-01 ENCOUNTER — Other Ambulatory Visit: Payer: Self-pay

## 2020-11-01 ENCOUNTER — Inpatient Hospital Stay (HOSPITAL_COMMUNITY): Payer: Medicare Other | Attending: Hematology

## 2020-11-01 DIAGNOSIS — C7A00 Malignant carcinoid tumor of unspecified site: Secondary | ICD-10-CM | POA: Diagnosis not present

## 2020-11-01 DIAGNOSIS — C7B04 Secondary carcinoid tumors of peritoneum: Secondary | ICD-10-CM | POA: Insufficient documentation

## 2020-11-01 DIAGNOSIS — Z992 Dependence on renal dialysis: Secondary | ICD-10-CM | POA: Insufficient documentation

## 2020-11-01 DIAGNOSIS — C7B02 Secondary carcinoid tumors of liver: Secondary | ICD-10-CM | POA: Diagnosis not present

## 2020-11-01 DIAGNOSIS — C7A019 Malignant carcinoid tumor of the small intestine, unspecified portion: Secondary | ICD-10-CM

## 2020-11-01 DIAGNOSIS — N186 End stage renal disease: Secondary | ICD-10-CM | POA: Insufficient documentation

## 2020-11-01 DIAGNOSIS — D631 Anemia in chronic kidney disease: Secondary | ICD-10-CM | POA: Insufficient documentation

## 2020-11-01 LAB — CBC WITH DIFFERENTIAL/PLATELET
Abs Immature Granulocytes: 0.01 10*3/uL (ref 0.00–0.07)
Basophils Absolute: 0 10*3/uL (ref 0.0–0.1)
Basophils Relative: 1 %
Eosinophils Absolute: 0.1 10*3/uL (ref 0.0–0.5)
Eosinophils Relative: 2 %
HCT: 39.8 % (ref 39.0–52.0)
Hemoglobin: 11.8 g/dL — ABNORMAL LOW (ref 13.0–17.0)
Immature Granulocytes: 0 %
Lymphocytes Relative: 9 %
Lymphs Abs: 0.3 10*3/uL — ABNORMAL LOW (ref 0.7–4.0)
MCH: 26 pg (ref 26.0–34.0)
MCHC: 29.6 g/dL — ABNORMAL LOW (ref 30.0–36.0)
MCV: 87.9 fL (ref 80.0–100.0)
Monocytes Absolute: 0.3 10*3/uL (ref 0.1–1.0)
Monocytes Relative: 8 %
Neutro Abs: 3 10*3/uL (ref 1.7–7.7)
Neutrophils Relative %: 80 %
Platelets: 120 10*3/uL — ABNORMAL LOW (ref 150–400)
RBC: 4.53 MIL/uL (ref 4.22–5.81)
RDW: 15.8 % — ABNORMAL HIGH (ref 11.5–15.5)
WBC: 3.7 10*3/uL — ABNORMAL LOW (ref 4.0–10.5)
nRBC: 0 % (ref 0.0–0.2)

## 2020-11-01 LAB — COMPREHENSIVE METABOLIC PANEL
ALT: 21 U/L (ref 0–44)
AST: 33 U/L (ref 15–41)
Albumin: 3.5 g/dL (ref 3.5–5.0)
Alkaline Phosphatase: 80 U/L (ref 38–126)
Anion gap: 11 (ref 5–15)
BUN: 39 mg/dL — ABNORMAL HIGH (ref 8–23)
CO2: 28 mmol/L (ref 22–32)
Calcium: 8.8 mg/dL — ABNORMAL LOW (ref 8.9–10.3)
Chloride: 100 mmol/L (ref 98–111)
Creatinine, Ser: 7.52 mg/dL — ABNORMAL HIGH (ref 0.61–1.24)
GFR, Estimated: 7 mL/min — ABNORMAL LOW (ref >60)
Glucose, Bld: 124 mg/dL — ABNORMAL HIGH (ref 70–99)
Potassium: 3.6 mmol/L (ref 3.5–5.1)
Sodium: 139 mmol/L (ref 135–145)
Total Bilirubin: 0.7 mg/dL (ref 0.3–1.2)
Total Protein: 6.2 g/dL — ABNORMAL LOW (ref 6.5–8.1)

## 2020-11-02 DIAGNOSIS — E559 Vitamin D deficiency, unspecified: Secondary | ICD-10-CM | POA: Diagnosis not present

## 2020-11-02 DIAGNOSIS — D509 Iron deficiency anemia, unspecified: Secondary | ICD-10-CM | POA: Diagnosis not present

## 2020-11-02 DIAGNOSIS — Z992 Dependence on renal dialysis: Secondary | ICD-10-CM | POA: Diagnosis not present

## 2020-11-02 DIAGNOSIS — D631 Anemia in chronic kidney disease: Secondary | ICD-10-CM | POA: Diagnosis not present

## 2020-11-02 DIAGNOSIS — N186 End stage renal disease: Secondary | ICD-10-CM | POA: Diagnosis not present

## 2020-11-02 DIAGNOSIS — N250 Renal osteodystrophy: Secondary | ICD-10-CM | POA: Diagnosis not present

## 2020-11-04 DIAGNOSIS — N186 End stage renal disease: Secondary | ICD-10-CM | POA: Diagnosis not present

## 2020-11-04 DIAGNOSIS — D509 Iron deficiency anemia, unspecified: Secondary | ICD-10-CM | POA: Diagnosis not present

## 2020-11-04 DIAGNOSIS — E559 Vitamin D deficiency, unspecified: Secondary | ICD-10-CM | POA: Diagnosis not present

## 2020-11-04 DIAGNOSIS — Z992 Dependence on renal dialysis: Secondary | ICD-10-CM | POA: Diagnosis not present

## 2020-11-04 DIAGNOSIS — D631 Anemia in chronic kidney disease: Secondary | ICD-10-CM | POA: Diagnosis not present

## 2020-11-04 DIAGNOSIS — N250 Renal osteodystrophy: Secondary | ICD-10-CM | POA: Diagnosis not present

## 2020-11-04 LAB — CHROMOGRANIN A: Chromogranin A (ng/mL): 528.9 ng/mL — ABNORMAL HIGH (ref 0.0–101.8)

## 2020-11-06 DIAGNOSIS — Z992 Dependence on renal dialysis: Secondary | ICD-10-CM | POA: Diagnosis not present

## 2020-11-06 DIAGNOSIS — E559 Vitamin D deficiency, unspecified: Secondary | ICD-10-CM | POA: Diagnosis not present

## 2020-11-06 DIAGNOSIS — N250 Renal osteodystrophy: Secondary | ICD-10-CM | POA: Diagnosis not present

## 2020-11-06 DIAGNOSIS — D509 Iron deficiency anemia, unspecified: Secondary | ICD-10-CM | POA: Diagnosis not present

## 2020-11-06 DIAGNOSIS — D631 Anemia in chronic kidney disease: Secondary | ICD-10-CM | POA: Diagnosis not present

## 2020-11-06 DIAGNOSIS — N186 End stage renal disease: Secondary | ICD-10-CM | POA: Diagnosis not present

## 2020-11-08 ENCOUNTER — Other Ambulatory Visit: Payer: Self-pay

## 2020-11-08 ENCOUNTER — Inpatient Hospital Stay (HOSPITAL_COMMUNITY): Payer: Medicare Other

## 2020-11-08 ENCOUNTER — Inpatient Hospital Stay (HOSPITAL_BASED_OUTPATIENT_CLINIC_OR_DEPARTMENT_OTHER): Payer: Medicare Other | Admitting: Hematology

## 2020-11-08 ENCOUNTER — Encounter (HOSPITAL_COMMUNITY): Payer: Self-pay | Admitting: Hematology

## 2020-11-08 VITALS — BP 128/84 | HR 64 | Temp 98.1°F | Resp 16 | Wt 189.3 lb

## 2020-11-08 DIAGNOSIS — C7A00 Malignant carcinoid tumor of unspecified site: Secondary | ICD-10-CM | POA: Diagnosis not present

## 2020-11-08 DIAGNOSIS — N186 End stage renal disease: Secondary | ICD-10-CM | POA: Diagnosis not present

## 2020-11-08 DIAGNOSIS — C7A019 Malignant carcinoid tumor of the small intestine, unspecified portion: Secondary | ICD-10-CM | POA: Diagnosis not present

## 2020-11-08 DIAGNOSIS — C7B02 Secondary carcinoid tumors of liver: Secondary | ICD-10-CM | POA: Diagnosis not present

## 2020-11-08 DIAGNOSIS — C7B04 Secondary carcinoid tumors of peritoneum: Secondary | ICD-10-CM | POA: Diagnosis not present

## 2020-11-08 DIAGNOSIS — D631 Anemia in chronic kidney disease: Secondary | ICD-10-CM | POA: Diagnosis not present

## 2020-11-08 DIAGNOSIS — Z992 Dependence on renal dialysis: Secondary | ICD-10-CM | POA: Diagnosis not present

## 2020-11-08 MED ORDER — OCTREOTIDE ACETATE 30 MG IM KIT
30.0000 mg | PACK | Freq: Once | INTRAMUSCULAR | Status: AC
  Administered 2020-11-08: 30 mg via INTRAMUSCULAR

## 2020-11-08 NOTE — Patient Instructions (Signed)
[facility]  Discharge Instructions: Thank you for choosing [facility] to provide your oncology and hematology care.  If you have a lab appointment with the Cancer Center, please come in thru the Main Entrance and check in at the main information desk.  Wear comfortable clothing and clothing appropriate for easy access to any Portacath or PICC line.   We strive to give you quality time with your provider. You may need to reschedule your appointment if you arrive late (15 or more minutes).  Arriving late affects you and other patients whose appointments are after yours.  Also, if you miss three or more appointments without notifying the office, you may be dismissed from the clinic at the provider's discretion.      For prescription refill requests, have your pharmacy contact our office and allow 72 hours for refills to be completed.    Today you received your Sandostatin injection.      To help prevent nausea and vomiting after your treatment, we encourage you to take your nausea medication as directed.  BELOW ARE SYMPTOMS THAT SHOULD BE REPORTED IMMEDIATELY: *FEVER GREATER THAN 100.4 F (38 C) OR HIGHER *CHILLS OR SWEATING *NAUSEA AND VOMITING THAT IS NOT CONTROLLED WITH YOUR NAUSEA MEDICATION *UNUSUAL SHORTNESS OF BREATH *UNUSUAL BRUISING OR BLEEDING *URINARY PROBLEMS (pain or burning when urinating, or frequent urination) *BOWEL PROBLEMS (unusual diarrhea, constipation, pain near the anus) TENDERNESS IN MOUTH AND THROAT WITH OR WITHOUT PRESENCE OF ULCERS (sore throat, sores in mouth, or a toothache) UNUSUAL RASH, SWELLING OR PAIN  UNUSUAL VAGINAL DISCHARGE OR ITCHING   Items with * indicate a potential emergency and should be followed up as soon as possible or go to the Emergency Department if any problems should occur.  Please show the CHEMOTHERAPY ALERT CARD or IMMUNOTHERAPY ALERT CARD at check-in to the Emergency Department and triage nurse.  Should you  have questions after your visit or need to cancel or reschedule your appointment, please contact [facility] [phone]  and follow the prompts.  Office hours are 8:00 a.m. to 4:30 p.m. Monday - Friday. Please note that voicemails left after 4:00 p.m. may not be returned until the following business day.  We are closed weekends and major holidays. You have access to a nurse at all times for urgent questions. Please call the main number to the clinic [phone] and follow the prompts.  For any non-urgent questions, you may also contact your provider using MyChart. We now offer e-Visits for anyone 18 and older to request care online for non-urgent symptoms. For details visit [email or web address].   Also download the MyChart app! Go to the app store, search "MyChart", open the app, select [facility], and log in with your MyChart username and password.  Due to Covid, a mask is required upon entering the hospital/clinic. If you do not have a mask, one will be given to you upon arrival. For doctor visits, patients may have 1 support person aged 18 or older with them. For treatment visits, patients cannot have anyone with them due to current Covid guidelines and our immunocompromised population.  

## 2020-11-08 NOTE — Progress Notes (Signed)
Patient tolerated Sandostatin injection with no complaints voiced.  Site clean and dry with no bruising or swelling noted at site.  Band aid applied.  VSS with discharge and left ambulatory with no s/s of distress noted.

## 2020-11-08 NOTE — Patient Instructions (Signed)
[place] at [facility] Discharge Instructions  You were seen today by Dr. Delton Coombes. He went over your recent results. You received your Sandostatin injection today; continue getting it every month. Continue taking everolimus daily. Dr. Delton Coombes will see you back in 2 months for labs and follow up.   Thank you for choosing [place] at [facility] to provide your oncology and hematology care.  To afford each patient quality time with our provider, please arrive at least 15 minutes before your scheduled appointment time.   If you have a lab appointment with the [place] [facility] please come in thru the Main Entrance and check in at the main information desk  You need to re-schedule your appointment should you arrive 10 or more minutes late.  We strive to give you quality time with our providers, and arriving late affects you and other patients whose appointments are after yours.  Also, if you no show three or more times for appointments you may be dismissed from the clinic at the providers discretion.     Again, thank you for choosing [facility].  Our hope is that these requests will decrease the amount of time that you wait before being seen by our physicians.       _____________________________________________________________  Should you have questions after your visit to [facility], please contact our office at [phone] between the hours of 8:00 a.m. and 4:30 p.m.  Voicemails left after 4:00 p.m. will not be returned until the following business day.  For prescription refill requests, have your pharmacy contact our office and allow 72 hours.    Cancer Center Support Programs:   > Cancer Support Group  2nd Tuesday of the month 1pm-2pm, Journey Room

## 2020-11-08 NOTE — Progress Notes (Signed)
Pt is taking Afinitor as prescribed with no side effects.

## 2020-11-08 NOTE — Progress Notes (Signed)
[place], [place] [address]   CLINIC:  Medical Oncology/Hematology  PCP:  [name], MD [place] / [place]  [address] [phone]   REASON FOR VISIT:  Follow-up for metastatic carcinoid tumor of small intestine  PRIOR THERAPY: None  NGS Results: Not done  CURRENT THERAPY: Everolimus daily & Sandostatin monthly  BRIEF ONCOLOGIC HISTORY:  Oncology History   No history exists.    CANCER STAGING: Cancer Staging No matching staging information was found for the patient.  INTERVAL HISTORY:  Allen Davenport, a 80 y.o. male, returns for routine follow-up of his metastatic carcinoid tumor of small intestine. Allen Davenport was last seen on 09/13/2020.   Today he reports feeling well. He is taking everolimus daily and tolerating it well; he denies having any mouth sores. He is going to HD on T/Th/Sat. He denies having diarrhea, abdominal pain or cramping, wheezing, or skin flushing. He is still making urine and urinates daily.   REVIEW OF SYSTEMS:  Review of Systems  Constitutional: Positive for appetite change (75%) and fatigue (75%).  HENT:   Negative for mouth sores.   Respiratory: Negative for wheezing.   Cardiovascular: Positive for leg swelling (ankles).  Gastrointestinal: Negative for abdominal pain and diarrhea.  Skin: Negative for rash.  All other systems reviewed and are negative.   PAST MEDICAL/SURGICAL HISTORY:  Past Medical History:  Diagnosis Date  . Anemia   . Cancer [facility]    right renal . Prostate- Radiation treatment  . CHF (congestive heart failure) ([facility])   . Chronic kidney disease    Dialysis T/TH/Sa  . Edema   . Heart murmur    "nothing to worry about"  . Hyperlipidemia   . Hypertension   . Malignant carcinoid tumor ([place]) 01/03/2016  . Pneumonia    as a child   Past Surgical History:  Procedure Laterality Date  . AV FISTULA PLACEMENT Left 10/20/2014   Procedure: Left Arm ARTERIOVENOUS (AV)  FISTULA CREATION;  Surgeon: Elam Dutch, MD;  Location: [facility];  Service: Vascular;  Laterality: Left;  . NEPHRECTOMY Right 2010  . PERIPHERAL VASCULAR BALLOON ANGIOPLASTY  06/20/2019   Procedure: PERIPHERAL VASCULAR BALLOON ANGIOPLASTY;  Surgeon: Elam Dutch, MD;  Location: [facility];  Service: Cardiovascular;;  . REVISON OF ARTERIOVENOUS FISTULA Left 08/04/2019   Procedure: REVISON OF ARTERIOVENOUS FISTULA WITH SIDE BRANCH LIGATION;  Surgeon: Elam Dutch, MD;  Location: [facility] OR;  Service: Vascular;  Laterality: Left;    SOCIAL HISTORY:  Social History   Socioeconomic History  . Marital status: Married    Spouse name: Not on file  . Number of children: Not on file  . Years of education: Not on file  . Highest education level: Not on file  Occupational History  . Not on file  Tobacco Use  . Smoking status: Never Smoker  . Smokeless tobacco: Never Used  Vaping Use  . Vaping Use: Never used  Substance and Sexual Activity  . Alcohol use: No    Alcohol/week: 0.0 standard drinks  . Drug use: No  . Sexual activity: Not on file  Other Topics Concern  . Not on file  Social History Narrative  . Not on file   Social Determinants of Health   Financial Resource Strain: Low Risk   . Difficulty of Paying Living Expenses: Not hard at all  Food Insecurity: No Food Insecurity  . Worried About Charity fundraiser  in the Last Year: Never true  . Ran Out of Food in the Last Year: Never true  Transportation Needs: No Transportation Needs  . Lack of Transportation (Medical): No  . Lack of Transportation (Non-Medical): No  Physical Activity: Insufficiently Active  . Days of Exercise per Week: 7 days  . Minutes of Exercise per Session: 10 min  Stress: No Stress Concern Present  . Feeling of Stress : Not at all  Social Connections: Moderately Integrated  . Frequency of Communication with Friends and Family: More than three times a week  . Frequency of Social Gatherings  with Friends and Family: Once a week  . Attends Religious Services: More than 4 times per year  . Active Member of Clubs or Organizations: No  . Attends Archivist Meetings: Never  . Marital Status: Married  Human resources officer Violence: Not At Risk  . Fear of Current or Ex-Partner: No  . Emotionally Abused: No  . Physically Abused: No  . Sexually Abused: No    FAMILY HISTORY:  Family History  Problem Relation Age of Onset  . Cancer Mother   . Hypertension Father   . Cancer Sister     CURRENT MEDICATIONS:  Current Outpatient Medications  Medication Sig Dispense Refill  . amLODipine (NORVASC) 10 MG tablet Take 10 mg by mouth daily.    [name] atorvastatin (LIPITOR) 80 MG tablet Take 80 mg by mouth daily at 6 PM.     . calcitRIOL (ROCALTROL) 0.25 MCG capsule Take 0.25 mcg by mouth daily.     . calcium acetate (PHOSLO) 667 MG capsule Take by mouth.    . carvedilol (COREG) 3.125 MG tablet Take 3.125 mg by mouth 2 (two) times daily with a meal.     . econazole nitrate 1 % cream Apply topically 2 (two) times daily.    [name] everolimus (AFINITOR) 5 MG tablet Take 1 tablet (5 mg total) by mouth daily. 28 tablet 6  . furosemide (LASIX) 40 MG tablet Take 40 mg by mouth every morning.    . iron polysaccharides (NIFEREX) 150 MG capsule Take 150 mg by mouth daily.     . metolazone (ZAROXOLYN) 5 MG tablet Take 5 mg by mouth every other day.    [name] octreotide (SANDOSTATIN LAR) 30 MG injection Inject 30 mg into the muscle every 28 (twenty-eight) days.     . Omega-3 Fatty Acids (FISH OIL) 1000 MG CAPS Take 1,000 mg by mouth daily.     [name] pyridOXINE (B-6) 50 MG tablet Take 1 tablet (50 mg total) by mouth daily. 30 tablet 8  . sodium bicarbonate 650 MG tablet Take 650 mg by mouth daily.    . tamsulosin (FLOMAX) 0.4 MG CAPS capsule Take 0.4 mg by mouth daily.      No current facility-administered medications for this visit.   Facility-Administered Medications Ordered in Other Visits  Medication Dose  Route Frequency Provider Last Rate Last Admin  . octreotide (SANDOSTATIN LAR) IM injection 30 mg  30 mg Intramuscular Once Derek Jack, MD        ALLERGIES:  No Known Allergies  PHYSICAL EXAM:  Performance status (ECOG): 1 - Symptomatic but completely ambulatory  Vitals:   11/08/20 1226  BP: 128/84  Pulse: 64  Resp: 16  Temp: 98.1 F (36.7 C)  SpO2: 97%   Wt Readings from Last 3 Encounters:  11/08/20 189 lb 4.8 oz (85.9 kg)  10/11/20 191 lb (86.6 kg)  09/13/20 192 lb 6.4 oz (87.3 kg)  Physical Exam Vitals reviewed.  Constitutional:      Appearance: Normal appearance.  Cardiovascular:     Rate and Rhythm: Normal rate and regular rhythm.     Pulses: Normal pulses.     Heart sounds: Normal heart sounds.  Pulmonary:     Effort: Pulmonary effort is normal.     Breath sounds: Normal breath sounds.  Musculoskeletal:     Right lower leg: Edema (1+) present.     Left lower leg: Edema (1+) present.  Neurological:     General: No focal deficit present.     Mental Status: He is alert and oriented to person, place, and time.  Psychiatric:        Mood and Affect: Mood normal.        Behavior: Behavior normal.      LABORATORY DATA:  I have reviewed the labs as listed.  CBC Latest Ref Rng & Units 11/01/2020 09/13/2020 07/19/2020  WBC 4.0 - 10.5 K/uL 3.7(L) 3.8(L) 5.3  Hemoglobin 13.0 - 17.0 g/dL 11.8(L) 10.7(L) 9.2(L)  Hematocrit 39.0 - 52.0 % 39.8 36.0(L) 29.8(L)  Platelets 150 - 400 K/uL 120(L) 122(L) 162   CMP Latest Ref Rng & Units 11/01/2020 09/13/2020 07/19/2020  Glucose 70 - 99 mg/dL 124(H) 125(H) 170(H)  BUN 8 - 23 mg/dL 39(H) 28(H) 38(H)  Creatinine 0.61 - 1.24 mg/dL 7.52(H) 7.33(H) 8.34(H)  Sodium 135 - 145 mmol/L 139 139 137  Potassium 3.5 - 5.1 mmol/L 3.6 3.6 4.3  Chloride 98 - 111 mmol/L 100 98 98  CO2 22 - 32 mmol/L 28 29 26   Calcium 8.9 - 10.3 mg/dL 8.8(L) 8.7(L) 8.5(L)  Total Protein 6.5 - 8.1 g/dL 6.2(L) 5.7(L) 6.2(L)  Total Bilirubin 0.3 - 1.2  mg/dL 0.7 0.6 0.6  Alkaline Phos 38 - 126 U/L 80 62 66  AST 15 - 41 U/L 33 30 35  ALT 0 - 44 U/L 21 17 18     DIAGNOSTIC IMAGING:  I have independently reviewed the scans and discussed with the patient. No results found.   ASSESSMENT:  1. 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 Everolimus 5 mg daily started on 05/17/2018. -PET scan on 10/29/2019 showed multifocal well-differentiated neuroendocrine tumor with intense avid lesions in the liver, mesentery, peritoneal space. No clear increase in size of the lesions. Many lesions have increased in radiotracer activity. -PET scan on 09/08/2020 showed no change in multifocal neuroendocrine tumor hepatic metastasis with no changes in multifocal mesenteric and peritoneal nodule or metastatic tumors.  Resolution of the right lung base pulmonary nodule.  No evidence of new metastatic disease.  2. Normocytic anemia: -This is from combination of end-stage renal disease and relative iron deficiency and myelosuppression from everolimus.  3. ESRD on HD: -Started on HD on 05/20/2019.   PLAN:  1. Malignant carcinoid tumor to the liver and peritoneum: -[facility] PET scan on 09/08/2020 showed stable disease with no new sites of metastatic disease. - He does not report any signs or symptoms of carcinoid syndrome. - Reviewed labs from 11/01/2020 which showed normal LFTs.  CBC with white count 3.7 and ANC normal.  Platelet count is slightly low at 120. - Serum chromogranin level is 528.  Chromogranin has been elevated anywhere between 300-700. - Continue everolimus and monthly Sandostatin injections. - RTC 2 months for follow-up.  We will plan to repeat scan in 6 months.  2. Normocytic anemia: -Hemoglobin is 11.8.  Continue Retacrit with dialysis.  3. ESRD  on HD: -Continue HD on Tuesday, Thursday and Saturday.  4.  High risk drug monitoring: - He does not have any mucositis or  hand-foot skin reaction.  No severe cytopenias from everolimus.    Orders placed this encounter:  No orders of the defined types were placed in this encounter.    Derek Jack, MD [place] [phone]   I, Milinda Antis, am acting as a scribe for Dr. Sanda Linger.  I, Derek Jack MD, have reviewed the above documentation for accuracy and completeness, and I agree with the above.

## 2020-11-09 DIAGNOSIS — D631 Anemia in chronic kidney disease: Secondary | ICD-10-CM | POA: Diagnosis not present

## 2020-11-09 DIAGNOSIS — N186 End stage renal disease: Secondary | ICD-10-CM | POA: Diagnosis not present

## 2020-11-09 DIAGNOSIS — D509 Iron deficiency anemia, unspecified: Secondary | ICD-10-CM | POA: Diagnosis not present

## 2020-11-09 DIAGNOSIS — Z992 Dependence on renal dialysis: Secondary | ICD-10-CM | POA: Diagnosis not present

## 2020-11-09 DIAGNOSIS — N250 Renal osteodystrophy: Secondary | ICD-10-CM | POA: Diagnosis not present

## 2020-11-09 DIAGNOSIS — E559 Vitamin D deficiency, unspecified: Secondary | ICD-10-CM | POA: Diagnosis not present

## 2020-11-11 DIAGNOSIS — D509 Iron deficiency anemia, unspecified: Secondary | ICD-10-CM | POA: Diagnosis not present

## 2020-11-11 DIAGNOSIS — N186 End stage renal disease: Secondary | ICD-10-CM | POA: Diagnosis not present

## 2020-11-11 DIAGNOSIS — E559 Vitamin D deficiency, unspecified: Secondary | ICD-10-CM | POA: Diagnosis not present

## 2020-11-11 DIAGNOSIS — D631 Anemia in chronic kidney disease: Secondary | ICD-10-CM | POA: Diagnosis not present

## 2020-11-11 DIAGNOSIS — Z992 Dependence on renal dialysis: Secondary | ICD-10-CM | POA: Diagnosis not present

## 2020-11-11 DIAGNOSIS — N250 Renal osteodystrophy: Secondary | ICD-10-CM | POA: Diagnosis not present

## 2020-11-13 DIAGNOSIS — D631 Anemia in chronic kidney disease: Secondary | ICD-10-CM | POA: Diagnosis not present

## 2020-11-13 DIAGNOSIS — N250 Renal osteodystrophy: Secondary | ICD-10-CM | POA: Diagnosis not present

## 2020-11-13 DIAGNOSIS — N186 End stage renal disease: Secondary | ICD-10-CM | POA: Diagnosis not present

## 2020-11-13 DIAGNOSIS — Z992 Dependence on renal dialysis: Secondary | ICD-10-CM | POA: Diagnosis not present

## 2020-11-13 DIAGNOSIS — D509 Iron deficiency anemia, unspecified: Secondary | ICD-10-CM | POA: Diagnosis not present

## 2020-11-13 DIAGNOSIS — E559 Vitamin D deficiency, unspecified: Secondary | ICD-10-CM | POA: Diagnosis not present

## 2020-11-16 DIAGNOSIS — Z992 Dependence on renal dialysis: Secondary | ICD-10-CM | POA: Diagnosis not present

## 2020-11-16 DIAGNOSIS — D631 Anemia in chronic kidney disease: Secondary | ICD-10-CM | POA: Diagnosis not present

## 2020-11-16 DIAGNOSIS — N250 Renal osteodystrophy: Secondary | ICD-10-CM | POA: Diagnosis not present

## 2020-11-16 DIAGNOSIS — N186 End stage renal disease: Secondary | ICD-10-CM | POA: Diagnosis not present

## 2020-11-16 DIAGNOSIS — D509 Iron deficiency anemia, unspecified: Secondary | ICD-10-CM | POA: Diagnosis not present

## 2020-11-18 DIAGNOSIS — D631 Anemia in chronic kidney disease: Secondary | ICD-10-CM | POA: Diagnosis not present

## 2020-11-18 DIAGNOSIS — D509 Iron deficiency anemia, unspecified: Secondary | ICD-10-CM | POA: Diagnosis not present

## 2020-11-18 DIAGNOSIS — N186 End stage renal disease: Secondary | ICD-10-CM | POA: Diagnosis not present

## 2020-11-18 DIAGNOSIS — N250 Renal osteodystrophy: Secondary | ICD-10-CM | POA: Diagnosis not present

## 2020-11-18 DIAGNOSIS — Z992 Dependence on renal dialysis: Secondary | ICD-10-CM | POA: Diagnosis not present

## 2020-11-20 DIAGNOSIS — D631 Anemia in chronic kidney disease: Secondary | ICD-10-CM | POA: Diagnosis not present

## 2020-11-20 DIAGNOSIS — Z992 Dependence on renal dialysis: Secondary | ICD-10-CM | POA: Diagnosis not present

## 2020-11-20 DIAGNOSIS — D509 Iron deficiency anemia, unspecified: Secondary | ICD-10-CM | POA: Diagnosis not present

## 2020-11-20 DIAGNOSIS — N250 Renal osteodystrophy: Secondary | ICD-10-CM | POA: Diagnosis not present

## 2020-11-20 DIAGNOSIS — N186 End stage renal disease: Secondary | ICD-10-CM | POA: Diagnosis not present

## 2020-11-23 DIAGNOSIS — N250 Renal osteodystrophy: Secondary | ICD-10-CM | POA: Diagnosis not present

## 2020-11-23 DIAGNOSIS — D509 Iron deficiency anemia, unspecified: Secondary | ICD-10-CM | POA: Diagnosis not present

## 2020-11-23 DIAGNOSIS — Z992 Dependence on renal dialysis: Secondary | ICD-10-CM | POA: Diagnosis not present

## 2020-11-23 DIAGNOSIS — D631 Anemia in chronic kidney disease: Secondary | ICD-10-CM | POA: Diagnosis not present

## 2020-11-23 DIAGNOSIS — N186 End stage renal disease: Secondary | ICD-10-CM | POA: Diagnosis not present

## 2020-11-25 DIAGNOSIS — N186 End stage renal disease: Secondary | ICD-10-CM | POA: Diagnosis not present

## 2020-11-25 DIAGNOSIS — Z992 Dependence on renal dialysis: Secondary | ICD-10-CM | POA: Diagnosis not present

## 2020-11-25 DIAGNOSIS — D631 Anemia in chronic kidney disease: Secondary | ICD-10-CM | POA: Diagnosis not present

## 2020-11-25 DIAGNOSIS — D509 Iron deficiency anemia, unspecified: Secondary | ICD-10-CM | POA: Diagnosis not present

## 2020-11-25 DIAGNOSIS — N250 Renal osteodystrophy: Secondary | ICD-10-CM | POA: Diagnosis not present

## 2020-11-27 DIAGNOSIS — N186 End stage renal disease: Secondary | ICD-10-CM | POA: Diagnosis not present

## 2020-11-27 DIAGNOSIS — Z992 Dependence on renal dialysis: Secondary | ICD-10-CM | POA: Diagnosis not present

## 2020-11-27 DIAGNOSIS — D509 Iron deficiency anemia, unspecified: Secondary | ICD-10-CM | POA: Diagnosis not present

## 2020-11-27 DIAGNOSIS — N250 Renal osteodystrophy: Secondary | ICD-10-CM | POA: Diagnosis not present

## 2020-11-27 DIAGNOSIS — D631 Anemia in chronic kidney disease: Secondary | ICD-10-CM | POA: Diagnosis not present

## 2020-11-30 DIAGNOSIS — D631 Anemia in chronic kidney disease: Secondary | ICD-10-CM | POA: Diagnosis not present

## 2020-11-30 DIAGNOSIS — D509 Iron deficiency anemia, unspecified: Secondary | ICD-10-CM | POA: Diagnosis not present

## 2020-11-30 DIAGNOSIS — N186 End stage renal disease: Secondary | ICD-10-CM | POA: Diagnosis not present

## 2020-11-30 DIAGNOSIS — Z992 Dependence on renal dialysis: Secondary | ICD-10-CM | POA: Diagnosis not present

## 2020-11-30 DIAGNOSIS — N250 Renal osteodystrophy: Secondary | ICD-10-CM | POA: Diagnosis not present

## 2020-12-01 DIAGNOSIS — I10 Essential (primary) hypertension: Secondary | ICD-10-CM | POA: Diagnosis not present

## 2020-12-01 DIAGNOSIS — E7849 Other hyperlipidemia: Secondary | ICD-10-CM | POA: Diagnosis not present

## 2020-12-01 DIAGNOSIS — N185 Chronic kidney disease, stage 5: Secondary | ICD-10-CM | POA: Diagnosis not present

## 2020-12-01 DIAGNOSIS — M818 Other osteoporosis without current pathological fracture: Secondary | ICD-10-CM | POA: Diagnosis not present

## 2020-12-02 DIAGNOSIS — Z992 Dependence on renal dialysis: Secondary | ICD-10-CM | POA: Diagnosis not present

## 2020-12-02 DIAGNOSIS — D509 Iron deficiency anemia, unspecified: Secondary | ICD-10-CM | POA: Diagnosis not present

## 2020-12-02 DIAGNOSIS — N186 End stage renal disease: Secondary | ICD-10-CM | POA: Diagnosis not present

## 2020-12-02 DIAGNOSIS — D631 Anemia in chronic kidney disease: Secondary | ICD-10-CM | POA: Diagnosis not present

## 2020-12-02 DIAGNOSIS — N250 Renal osteodystrophy: Secondary | ICD-10-CM | POA: Diagnosis not present

## 2020-12-04 DIAGNOSIS — N186 End stage renal disease: Secondary | ICD-10-CM | POA: Diagnosis not present

## 2020-12-04 DIAGNOSIS — D509 Iron deficiency anemia, unspecified: Secondary | ICD-10-CM | POA: Diagnosis not present

## 2020-12-04 DIAGNOSIS — Z992 Dependence on renal dialysis: Secondary | ICD-10-CM | POA: Diagnosis not present

## 2020-12-04 DIAGNOSIS — D631 Anemia in chronic kidney disease: Secondary | ICD-10-CM | POA: Diagnosis not present

## 2020-12-04 DIAGNOSIS — N250 Renal osteodystrophy: Secondary | ICD-10-CM | POA: Diagnosis not present

## 2020-12-06 ENCOUNTER — Inpatient Hospital Stay (HOSPITAL_COMMUNITY): Payer: Medicare Other | Attending: Hematology

## 2020-12-06 ENCOUNTER — Other Ambulatory Visit: Payer: Self-pay

## 2020-12-06 VITALS — BP 122/70 | HR 74 | Temp 97.0°F | Resp 17 | Wt 189.1 lb

## 2020-12-06 DIAGNOSIS — C7A00 Malignant carcinoid tumor of unspecified site: Secondary | ICD-10-CM

## 2020-12-06 DIAGNOSIS — C7B04 Secondary carcinoid tumors of peritoneum: Secondary | ICD-10-CM | POA: Diagnosis not present

## 2020-12-06 DIAGNOSIS — C7B02 Secondary carcinoid tumors of liver: Secondary | ICD-10-CM | POA: Insufficient documentation

## 2020-12-06 MED ORDER — OCTREOTIDE ACETATE 30 MG IM KIT
30.0000 mg | PACK | Freq: Once | INTRAMUSCULAR | Status: AC
  Administered 2020-12-06: 30 mg via INTRAMUSCULAR

## 2020-12-06 NOTE — Progress Notes (Signed)
Patient tolerated Sandostatin 30 mg injection with no complaints voiced.  Site clean and dry with no bruising or swelling noted.  No complaints of pain.  Discharged with vital signs stable and no signs or symptoms of distress noted.  

## 2020-12-07 DIAGNOSIS — D631 Anemia in chronic kidney disease: Secondary | ICD-10-CM | POA: Diagnosis not present

## 2020-12-07 DIAGNOSIS — D509 Iron deficiency anemia, unspecified: Secondary | ICD-10-CM | POA: Diagnosis not present

## 2020-12-07 DIAGNOSIS — N250 Renal osteodystrophy: Secondary | ICD-10-CM | POA: Diagnosis not present

## 2020-12-07 DIAGNOSIS — Z992 Dependence on renal dialysis: Secondary | ICD-10-CM | POA: Diagnosis not present

## 2020-12-07 DIAGNOSIS — N186 End stage renal disease: Secondary | ICD-10-CM | POA: Diagnosis not present

## 2020-12-09 DIAGNOSIS — N250 Renal osteodystrophy: Secondary | ICD-10-CM | POA: Diagnosis not present

## 2020-12-09 DIAGNOSIS — N186 End stage renal disease: Secondary | ICD-10-CM | POA: Diagnosis not present

## 2020-12-09 DIAGNOSIS — D631 Anemia in chronic kidney disease: Secondary | ICD-10-CM | POA: Diagnosis not present

## 2020-12-09 DIAGNOSIS — D509 Iron deficiency anemia, unspecified: Secondary | ICD-10-CM | POA: Diagnosis not present

## 2020-12-09 DIAGNOSIS — Z992 Dependence on renal dialysis: Secondary | ICD-10-CM | POA: Diagnosis not present

## 2020-12-11 DIAGNOSIS — Z992 Dependence on renal dialysis: Secondary | ICD-10-CM | POA: Diagnosis not present

## 2020-12-11 DIAGNOSIS — N186 End stage renal disease: Secondary | ICD-10-CM | POA: Diagnosis not present

## 2020-12-11 DIAGNOSIS — N250 Renal osteodystrophy: Secondary | ICD-10-CM | POA: Diagnosis not present

## 2020-12-11 DIAGNOSIS — D631 Anemia in chronic kidney disease: Secondary | ICD-10-CM | POA: Diagnosis not present

## 2020-12-11 DIAGNOSIS — D509 Iron deficiency anemia, unspecified: Secondary | ICD-10-CM | POA: Diagnosis not present

## 2020-12-14 DIAGNOSIS — D509 Iron deficiency anemia, unspecified: Secondary | ICD-10-CM | POA: Diagnosis not present

## 2020-12-14 DIAGNOSIS — Z992 Dependence on renal dialysis: Secondary | ICD-10-CM | POA: Diagnosis not present

## 2020-12-14 DIAGNOSIS — N186 End stage renal disease: Secondary | ICD-10-CM | POA: Diagnosis not present

## 2020-12-14 DIAGNOSIS — D631 Anemia in chronic kidney disease: Secondary | ICD-10-CM | POA: Diagnosis not present

## 2020-12-14 DIAGNOSIS — N250 Renal osteodystrophy: Secondary | ICD-10-CM | POA: Diagnosis not present

## 2020-12-16 DIAGNOSIS — D509 Iron deficiency anemia, unspecified: Secondary | ICD-10-CM | POA: Diagnosis not present

## 2020-12-16 DIAGNOSIS — Z992 Dependence on renal dialysis: Secondary | ICD-10-CM | POA: Diagnosis not present

## 2020-12-16 DIAGNOSIS — N250 Renal osteodystrophy: Secondary | ICD-10-CM | POA: Diagnosis not present

## 2020-12-16 DIAGNOSIS — N186 End stage renal disease: Secondary | ICD-10-CM | POA: Diagnosis not present

## 2020-12-16 DIAGNOSIS — D631 Anemia in chronic kidney disease: Secondary | ICD-10-CM | POA: Diagnosis not present

## 2020-12-18 DIAGNOSIS — D509 Iron deficiency anemia, unspecified: Secondary | ICD-10-CM | POA: Diagnosis not present

## 2020-12-18 DIAGNOSIS — N250 Renal osteodystrophy: Secondary | ICD-10-CM | POA: Diagnosis not present

## 2020-12-18 DIAGNOSIS — D631 Anemia in chronic kidney disease: Secondary | ICD-10-CM | POA: Diagnosis not present

## 2020-12-18 DIAGNOSIS — N186 End stage renal disease: Secondary | ICD-10-CM | POA: Diagnosis not present

## 2020-12-18 DIAGNOSIS — Z992 Dependence on renal dialysis: Secondary | ICD-10-CM | POA: Diagnosis not present

## 2020-12-21 DIAGNOSIS — D509 Iron deficiency anemia, unspecified: Secondary | ICD-10-CM | POA: Diagnosis not present

## 2020-12-21 DIAGNOSIS — Z992 Dependence on renal dialysis: Secondary | ICD-10-CM | POA: Diagnosis not present

## 2020-12-21 DIAGNOSIS — N186 End stage renal disease: Secondary | ICD-10-CM | POA: Diagnosis not present

## 2020-12-21 DIAGNOSIS — D631 Anemia in chronic kidney disease: Secondary | ICD-10-CM | POA: Diagnosis not present

## 2020-12-21 DIAGNOSIS — N250 Renal osteodystrophy: Secondary | ICD-10-CM | POA: Diagnosis not present

## 2020-12-22 DIAGNOSIS — R1310 Dysphagia, unspecified: Secondary | ICD-10-CM | POA: Diagnosis not present

## 2020-12-22 DIAGNOSIS — H6121 Impacted cerumen, right ear: Secondary | ICD-10-CM | POA: Diagnosis not present

## 2020-12-22 DIAGNOSIS — Z8521 Personal history of malignant neoplasm of larynx: Secondary | ICD-10-CM | POA: Diagnosis not present

## 2020-12-22 DIAGNOSIS — R499 Unspecified voice and resonance disorder: Secondary | ICD-10-CM | POA: Diagnosis not present

## 2020-12-23 DIAGNOSIS — Z992 Dependence on renal dialysis: Secondary | ICD-10-CM | POA: Diagnosis not present

## 2020-12-23 DIAGNOSIS — D631 Anemia in chronic kidney disease: Secondary | ICD-10-CM | POA: Diagnosis not present

## 2020-12-23 DIAGNOSIS — N250 Renal osteodystrophy: Secondary | ICD-10-CM | POA: Diagnosis not present

## 2020-12-23 DIAGNOSIS — N186 End stage renal disease: Secondary | ICD-10-CM | POA: Diagnosis not present

## 2020-12-23 DIAGNOSIS — D509 Iron deficiency anemia, unspecified: Secondary | ICD-10-CM | POA: Diagnosis not present

## 2020-12-25 DIAGNOSIS — N186 End stage renal disease: Secondary | ICD-10-CM | POA: Diagnosis not present

## 2020-12-25 DIAGNOSIS — N250 Renal osteodystrophy: Secondary | ICD-10-CM | POA: Diagnosis not present

## 2020-12-25 DIAGNOSIS — Z992 Dependence on renal dialysis: Secondary | ICD-10-CM | POA: Diagnosis not present

## 2020-12-25 DIAGNOSIS — D631 Anemia in chronic kidney disease: Secondary | ICD-10-CM | POA: Diagnosis not present

## 2020-12-25 DIAGNOSIS — D509 Iron deficiency anemia, unspecified: Secondary | ICD-10-CM | POA: Diagnosis not present

## 2020-12-28 DIAGNOSIS — Z992 Dependence on renal dialysis: Secondary | ICD-10-CM | POA: Diagnosis not present

## 2020-12-28 DIAGNOSIS — N250 Renal osteodystrophy: Secondary | ICD-10-CM | POA: Diagnosis not present

## 2020-12-28 DIAGNOSIS — D509 Iron deficiency anemia, unspecified: Secondary | ICD-10-CM | POA: Diagnosis not present

## 2020-12-28 DIAGNOSIS — D631 Anemia in chronic kidney disease: Secondary | ICD-10-CM | POA: Diagnosis not present

## 2020-12-28 DIAGNOSIS — N186 End stage renal disease: Secondary | ICD-10-CM | POA: Diagnosis not present

## 2020-12-30 DIAGNOSIS — D509 Iron deficiency anemia, unspecified: Secondary | ICD-10-CM | POA: Diagnosis not present

## 2020-12-30 DIAGNOSIS — Z992 Dependence on renal dialysis: Secondary | ICD-10-CM | POA: Diagnosis not present

## 2020-12-30 DIAGNOSIS — N250 Renal osteodystrophy: Secondary | ICD-10-CM | POA: Diagnosis not present

## 2020-12-30 DIAGNOSIS — N186 End stage renal disease: Secondary | ICD-10-CM | POA: Diagnosis not present

## 2020-12-30 DIAGNOSIS — D631 Anemia in chronic kidney disease: Secondary | ICD-10-CM | POA: Diagnosis not present

## 2020-12-31 ENCOUNTER — Other Ambulatory Visit (HOSPITAL_COMMUNITY): Payer: Self-pay | Admitting: *Deleted

## 2020-12-31 DIAGNOSIS — C7A00 Malignant carcinoid tumor of unspecified site: Secondary | ICD-10-CM

## 2021-01-01 DIAGNOSIS — D509 Iron deficiency anemia, unspecified: Secondary | ICD-10-CM | POA: Diagnosis not present

## 2021-01-01 DIAGNOSIS — N186 End stage renal disease: Secondary | ICD-10-CM | POA: Diagnosis not present

## 2021-01-01 DIAGNOSIS — Z992 Dependence on renal dialysis: Secondary | ICD-10-CM | POA: Diagnosis not present

## 2021-01-01 DIAGNOSIS — N250 Renal osteodystrophy: Secondary | ICD-10-CM | POA: Diagnosis not present

## 2021-01-01 DIAGNOSIS — D631 Anemia in chronic kidney disease: Secondary | ICD-10-CM | POA: Diagnosis not present

## 2021-01-03 ENCOUNTER — Inpatient Hospital Stay (HOSPITAL_COMMUNITY): Payer: Medicare Other

## 2021-01-03 ENCOUNTER — Inpatient Hospital Stay (HOSPITAL_COMMUNITY): Payer: Medicare Other | Attending: Hematology

## 2021-01-03 ENCOUNTER — Ambulatory Visit (HOSPITAL_COMMUNITY): Payer: Medicare Other | Admitting: Hematology

## 2021-01-03 ENCOUNTER — Other Ambulatory Visit: Payer: Self-pay

## 2021-01-03 VITALS — BP 140/81 | HR 72 | Temp 97.0°F | Resp 18 | Wt 186.8 lb

## 2021-01-03 DIAGNOSIS — E1151 Type 2 diabetes mellitus with diabetic peripheral angiopathy without gangrene: Secondary | ICD-10-CM | POA: Diagnosis not present

## 2021-01-03 DIAGNOSIS — C7A00 Malignant carcinoid tumor of unspecified site: Secondary | ICD-10-CM

## 2021-01-03 DIAGNOSIS — C7B02 Secondary carcinoid tumors of liver: Secondary | ICD-10-CM | POA: Diagnosis not present

## 2021-01-03 DIAGNOSIS — E1140 Type 2 diabetes mellitus with diabetic neuropathy, unspecified: Secondary | ICD-10-CM | POA: Diagnosis not present

## 2021-01-03 LAB — COMPREHENSIVE METABOLIC PANEL
ALT: 21 U/L (ref 0–44)
AST: 35 U/L (ref 15–41)
Albumin: 3.5 g/dL (ref 3.5–5.0)
Alkaline Phosphatase: 78 U/L (ref 38–126)
Anion gap: 11 (ref 5–15)
BUN: 36 mg/dL — ABNORMAL HIGH (ref 8–23)
CO2: 28 mmol/L (ref 22–32)
Calcium: 8.9 mg/dL (ref 8.9–10.3)
Chloride: 97 mmol/L — ABNORMAL LOW (ref 98–111)
Creatinine, Ser: 7.21 mg/dL — ABNORMAL HIGH (ref 0.61–1.24)
GFR, Estimated: 7 mL/min — ABNORMAL LOW (ref >60)
Glucose, Bld: 159 mg/dL — ABNORMAL HIGH (ref 70–99)
Potassium: 3.8 mmol/L (ref 3.5–5.1)
Sodium: 136 mmol/L (ref 135–145)
Total Bilirubin: 0.6 mg/dL (ref 0.3–1.2)
Total Protein: 6 g/dL — ABNORMAL LOW (ref 6.5–8.1)

## 2021-01-03 LAB — CBC WITH DIFFERENTIAL/PLATELET
Abs Immature Granulocytes: 0 10*3/uL (ref 0.00–0.07)
Basophils Absolute: 0 10*3/uL (ref 0.0–0.1)
Basophils Relative: 1 %
Eosinophils Absolute: 0.1 10*3/uL (ref 0.0–0.5)
Eosinophils Relative: 2 %
HCT: 43.4 % (ref 39.0–52.0)
Hemoglobin: 13.4 g/dL (ref 13.0–17.0)
Immature Granulocytes: 0 %
Lymphocytes Relative: 9 %
Lymphs Abs: 0.3 10*3/uL — ABNORMAL LOW (ref 0.7–4.0)
MCH: 26.9 pg (ref 26.0–34.0)
MCHC: 30.9 g/dL (ref 30.0–36.0)
MCV: 87 fL (ref 80.0–100.0)
Monocytes Absolute: 0.4 10*3/uL (ref 0.1–1.0)
Monocytes Relative: 10 %
Neutro Abs: 3 10*3/uL (ref 1.7–7.7)
Neutrophils Relative %: 78 %
Platelets: 124 10*3/uL — ABNORMAL LOW (ref 150–400)
RBC: 4.99 MIL/uL (ref 4.22–5.81)
RDW: 15.6 % — ABNORMAL HIGH (ref 11.5–15.5)
WBC: 3.8 10*3/uL — ABNORMAL LOW (ref 4.0–10.5)
nRBC: 0 % (ref 0.0–0.2)

## 2021-01-03 LAB — LACTATE DEHYDROGENASE: LDH: 152 U/L (ref 98–192)

## 2021-01-03 MED ORDER — OCTREOTIDE ACETATE 30 MG IM KIT
30.0000 mg | PACK | Freq: Once | INTRAMUSCULAR | Status: AC
  Administered 2021-01-03: 30 mg via INTRAMUSCULAR

## 2021-01-03 MED ORDER — OCTREOTIDE ACETATE 30 MG IM KIT
PACK | INTRAMUSCULAR | Status: AC
  Filled 2021-01-03: qty 1

## 2021-01-03 NOTE — Progress Notes (Signed)
Allen Davenport presents today for Sandostatin injection per the provider's orders.  Stable during administration without incident; injection site WNL; see MAR for injection details.  Patient tolerated procedure well and without incident.  No questions or complaints noted at this time.  Discharge from clinic ambulatory in stable condition.  Alert and oriented X 3.  Follow up with  Cancer Center as scheduled.  °

## 2021-01-03 NOTE — Patient Instructions (Signed)
[facility]  Discharge Instructions: Thank you for choosing [facility] to provide your oncology and hematology care.  If you have a lab appointment with the Cancer Center, please come in thru the Main Entrance and check in at the main information desk.  Wear comfortable clothing and clothing appropriate for easy access to any Portacath or PICC line.   We strive to give you quality time with your provider. You may need to reschedule your appointment if you arrive late (15 or more minutes).  Arriving late affects you and other patients whose appointments are after yours.  Also, if you miss three or more appointments without notifying the office, you may be dismissed from the clinic at the provider's discretion.      For prescription refill requests, have your pharmacy contact our office and allow 72 hours for refills to be completed.    Today you received the following chemotherapy and/or immunotherapy agents Sandostatin injection      To help prevent nausea and vomiting after your treatment, we encourage you to take your nausea medication as directed.  BELOW ARE SYMPTOMS THAT SHOULD BE REPORTED IMMEDIATELY: *FEVER GREATER THAN 100.4 F (38 C) OR HIGHER *CHILLS OR SWEATING *NAUSEA AND VOMITING THAT IS NOT CONTROLLED WITH YOUR NAUSEA MEDICATION *UNUSUAL SHORTNESS OF BREATH *UNUSUAL BRUISING OR BLEEDING *URINARY PROBLEMS (pain or burning when urinating, or frequent urination) *BOWEL PROBLEMS (unusual diarrhea, constipation, pain near the anus) TENDERNESS IN MOUTH AND THROAT WITH OR WITHOUT PRESENCE OF ULCERS (sore throat, sores in mouth, or a toothache) UNUSUAL RASH, SWELLING OR PAIN  UNUSUAL VAGINAL DISCHARGE OR ITCHING   Items with * indicate a potential emergency and should be followed up as soon as possible or go to the Emergency Department if any problems should occur.  Please show the CHEMOTHERAPY ALERT CARD or IMMUNOTHERAPY ALERT CARD at check-in to the  Emergency Department and triage nurse.  Should you have questions after your visit or need to cancel or reschedule your appointment, please contact [facility] [phone]  and follow the prompts.  Office hours are 8:00 a.m. to 4:30 p.m. Monday - Friday. Please note that voicemails left after 4:00 p.m. may not be returned until the following business day.  We are closed weekends and major holidays. You have access to a nurse at all times for urgent questions. Please call the main number to the clinic [phone] and follow the prompts.  For any non-urgent questions, you may also contact your provider using MyChart. We now offer e-Visits for anyone 18 and older to request care online for non-urgent symptoms. For details visit [email or web address].   Also download the MyChart app! Go to the app store, search "MyChart", open the app, select [place], and log in with your MyChart username and password.  Due to Covid, a mask is required upon entering the hospital/clinic. If you do not have a mask, one will be given to you upon arrival. For doctor visits, patients may have 1 support person aged 18 or older with them. For treatment visits, patients cannot have anyone with them due to current Covid guidelines and our immunocompromised population.  

## 2021-01-04 DIAGNOSIS — Z992 Dependence on renal dialysis: Secondary | ICD-10-CM | POA: Diagnosis not present

## 2021-01-04 DIAGNOSIS — N186 End stage renal disease: Secondary | ICD-10-CM | POA: Diagnosis not present

## 2021-01-04 DIAGNOSIS — N250 Renal osteodystrophy: Secondary | ICD-10-CM | POA: Diagnosis not present

## 2021-01-04 DIAGNOSIS — D631 Anemia in chronic kidney disease: Secondary | ICD-10-CM | POA: Diagnosis not present

## 2021-01-04 DIAGNOSIS — D509 Iron deficiency anemia, unspecified: Secondary | ICD-10-CM | POA: Diagnosis not present

## 2021-01-04 LAB — CHROMOGRANIN A: Chromogranin A (ng/mL): 565.4 ng/mL — ABNORMAL HIGH (ref 0.0–101.8)

## 2021-01-06 DIAGNOSIS — Z992 Dependence on renal dialysis: Secondary | ICD-10-CM | POA: Diagnosis not present

## 2021-01-06 DIAGNOSIS — N250 Renal osteodystrophy: Secondary | ICD-10-CM | POA: Diagnosis not present

## 2021-01-06 DIAGNOSIS — N186 End stage renal disease: Secondary | ICD-10-CM | POA: Diagnosis not present

## 2021-01-06 DIAGNOSIS — D631 Anemia in chronic kidney disease: Secondary | ICD-10-CM | POA: Diagnosis not present

## 2021-01-06 DIAGNOSIS — D509 Iron deficiency anemia, unspecified: Secondary | ICD-10-CM | POA: Diagnosis not present

## 2021-01-08 DIAGNOSIS — N250 Renal osteodystrophy: Secondary | ICD-10-CM | POA: Diagnosis not present

## 2021-01-08 DIAGNOSIS — Z992 Dependence on renal dialysis: Secondary | ICD-10-CM | POA: Diagnosis not present

## 2021-01-08 DIAGNOSIS — N186 End stage renal disease: Secondary | ICD-10-CM | POA: Diagnosis not present

## 2021-01-08 DIAGNOSIS — D631 Anemia in chronic kidney disease: Secondary | ICD-10-CM | POA: Diagnosis not present

## 2021-01-08 DIAGNOSIS — D509 Iron deficiency anemia, unspecified: Secondary | ICD-10-CM | POA: Diagnosis not present

## 2021-01-11 DIAGNOSIS — Z992 Dependence on renal dialysis: Secondary | ICD-10-CM | POA: Diagnosis not present

## 2021-01-11 DIAGNOSIS — D509 Iron deficiency anemia, unspecified: Secondary | ICD-10-CM | POA: Diagnosis not present

## 2021-01-11 DIAGNOSIS — D631 Anemia in chronic kidney disease: Secondary | ICD-10-CM | POA: Diagnosis not present

## 2021-01-11 DIAGNOSIS — N250 Renal osteodystrophy: Secondary | ICD-10-CM | POA: Diagnosis not present

## 2021-01-11 DIAGNOSIS — N186 End stage renal disease: Secondary | ICD-10-CM | POA: Diagnosis not present

## 2021-01-13 DIAGNOSIS — Z992 Dependence on renal dialysis: Secondary | ICD-10-CM | POA: Diagnosis not present

## 2021-01-13 DIAGNOSIS — D509 Iron deficiency anemia, unspecified: Secondary | ICD-10-CM | POA: Diagnosis not present

## 2021-01-13 DIAGNOSIS — D631 Anemia in chronic kidney disease: Secondary | ICD-10-CM | POA: Diagnosis not present

## 2021-01-13 DIAGNOSIS — N250 Renal osteodystrophy: Secondary | ICD-10-CM | POA: Diagnosis not present

## 2021-01-13 DIAGNOSIS — N186 End stage renal disease: Secondary | ICD-10-CM | POA: Diagnosis not present

## 2021-01-15 DIAGNOSIS — D509 Iron deficiency anemia, unspecified: Secondary | ICD-10-CM | POA: Diagnosis not present

## 2021-01-15 DIAGNOSIS — N186 End stage renal disease: Secondary | ICD-10-CM | POA: Diagnosis not present

## 2021-01-15 DIAGNOSIS — Z992 Dependence on renal dialysis: Secondary | ICD-10-CM | POA: Diagnosis not present

## 2021-01-15 DIAGNOSIS — N250 Renal osteodystrophy: Secondary | ICD-10-CM | POA: Diagnosis not present

## 2021-01-15 DIAGNOSIS — D631 Anemia in chronic kidney disease: Secondary | ICD-10-CM | POA: Diagnosis not present

## 2021-01-18 DIAGNOSIS — N186 End stage renal disease: Secondary | ICD-10-CM | POA: Diagnosis not present

## 2021-01-18 DIAGNOSIS — D509 Iron deficiency anemia, unspecified: Secondary | ICD-10-CM | POA: Diagnosis not present

## 2021-01-18 DIAGNOSIS — D631 Anemia in chronic kidney disease: Secondary | ICD-10-CM | POA: Diagnosis not present

## 2021-01-18 DIAGNOSIS — Z992 Dependence on renal dialysis: Secondary | ICD-10-CM | POA: Diagnosis not present

## 2021-01-18 DIAGNOSIS — N250 Renal osteodystrophy: Secondary | ICD-10-CM | POA: Diagnosis not present

## 2021-01-20 DIAGNOSIS — N186 End stage renal disease: Secondary | ICD-10-CM | POA: Diagnosis not present

## 2021-01-20 DIAGNOSIS — D509 Iron deficiency anemia, unspecified: Secondary | ICD-10-CM | POA: Diagnosis not present

## 2021-01-20 DIAGNOSIS — Z992 Dependence on renal dialysis: Secondary | ICD-10-CM | POA: Diagnosis not present

## 2021-01-20 DIAGNOSIS — N250 Renal osteodystrophy: Secondary | ICD-10-CM | POA: Diagnosis not present

## 2021-01-20 DIAGNOSIS — D631 Anemia in chronic kidney disease: Secondary | ICD-10-CM | POA: Diagnosis not present

## 2021-01-22 DIAGNOSIS — N186 End stage renal disease: Secondary | ICD-10-CM | POA: Diagnosis not present

## 2021-01-22 DIAGNOSIS — Z992 Dependence on renal dialysis: Secondary | ICD-10-CM | POA: Diagnosis not present

## 2021-01-22 DIAGNOSIS — D509 Iron deficiency anemia, unspecified: Secondary | ICD-10-CM | POA: Diagnosis not present

## 2021-01-22 DIAGNOSIS — N250 Renal osteodystrophy: Secondary | ICD-10-CM | POA: Diagnosis not present

## 2021-01-22 DIAGNOSIS — D631 Anemia in chronic kidney disease: Secondary | ICD-10-CM | POA: Diagnosis not present

## 2021-01-25 DIAGNOSIS — N186 End stage renal disease: Secondary | ICD-10-CM | POA: Diagnosis not present

## 2021-01-25 DIAGNOSIS — N250 Renal osteodystrophy: Secondary | ICD-10-CM | POA: Diagnosis not present

## 2021-01-25 DIAGNOSIS — Z992 Dependence on renal dialysis: Secondary | ICD-10-CM | POA: Diagnosis not present

## 2021-01-25 DIAGNOSIS — D509 Iron deficiency anemia, unspecified: Secondary | ICD-10-CM | POA: Diagnosis not present

## 2021-01-25 DIAGNOSIS — D631 Anemia in chronic kidney disease: Secondary | ICD-10-CM | POA: Diagnosis not present

## 2021-01-27 DIAGNOSIS — N186 End stage renal disease: Secondary | ICD-10-CM | POA: Diagnosis not present

## 2021-01-27 DIAGNOSIS — D509 Iron deficiency anemia, unspecified: Secondary | ICD-10-CM | POA: Diagnosis not present

## 2021-01-27 DIAGNOSIS — D631 Anemia in chronic kidney disease: Secondary | ICD-10-CM | POA: Diagnosis not present

## 2021-01-27 DIAGNOSIS — Z992 Dependence on renal dialysis: Secondary | ICD-10-CM | POA: Diagnosis not present

## 2021-01-27 DIAGNOSIS — N250 Renal osteodystrophy: Secondary | ICD-10-CM | POA: Diagnosis not present

## 2021-01-29 DIAGNOSIS — Z992 Dependence on renal dialysis: Secondary | ICD-10-CM | POA: Diagnosis not present

## 2021-01-29 DIAGNOSIS — N250 Renal osteodystrophy: Secondary | ICD-10-CM | POA: Diagnosis not present

## 2021-01-29 DIAGNOSIS — N186 End stage renal disease: Secondary | ICD-10-CM | POA: Diagnosis not present

## 2021-01-29 DIAGNOSIS — D509 Iron deficiency anemia, unspecified: Secondary | ICD-10-CM | POA: Diagnosis not present

## 2021-01-29 DIAGNOSIS — D631 Anemia in chronic kidney disease: Secondary | ICD-10-CM | POA: Diagnosis not present

## 2021-01-31 ENCOUNTER — Other Ambulatory Visit (HOSPITAL_COMMUNITY): Payer: Medicare Other

## 2021-01-31 ENCOUNTER — Inpatient Hospital Stay (HOSPITAL_BASED_OUTPATIENT_CLINIC_OR_DEPARTMENT_OTHER): Payer: Medicare Other | Admitting: Physician Assistant

## 2021-01-31 ENCOUNTER — Encounter (HOSPITAL_COMMUNITY): Payer: Self-pay

## 2021-01-31 ENCOUNTER — Ambulatory Visit (HOSPITAL_COMMUNITY): Payer: Medicare Other

## 2021-01-31 ENCOUNTER — Other Ambulatory Visit: Payer: Self-pay

## 2021-01-31 ENCOUNTER — Inpatient Hospital Stay (HOSPITAL_COMMUNITY): Payer: Medicare Other | Attending: Hematology

## 2021-01-31 ENCOUNTER — Inpatient Hospital Stay (HOSPITAL_COMMUNITY): Payer: Medicare Other

## 2021-01-31 ENCOUNTER — Ambulatory Visit (HOSPITAL_COMMUNITY): Payer: Medicare Other | Admitting: Hematology

## 2021-01-31 VITALS — BP 110/66 | HR 71 | Temp 96.9°F | Resp 19 | Wt 184.7 lb

## 2021-01-31 DIAGNOSIS — C7A019 Malignant carcinoid tumor of the small intestine, unspecified portion: Secondary | ICD-10-CM | POA: Diagnosis not present

## 2021-01-31 DIAGNOSIS — C179 Malignant neoplasm of small intestine, unspecified: Secondary | ICD-10-CM

## 2021-01-31 DIAGNOSIS — C7A00 Malignant carcinoid tumor of unspecified site: Secondary | ICD-10-CM | POA: Insufficient documentation

## 2021-01-31 DIAGNOSIS — C7B02 Secondary carcinoid tumors of liver: Secondary | ICD-10-CM | POA: Diagnosis not present

## 2021-01-31 MED ORDER — OCTREOTIDE ACETATE 30 MG IM KIT
30.0000 mg | PACK | Freq: Once | INTRAMUSCULAR | Status: AC
  Administered 2021-01-31: 30 mg via INTRAMUSCULAR

## 2021-01-31 NOTE — Progress Notes (Signed)
Patient tolerated sandostatin injection with no complaints voiced.  Site clean and dry with no bruising or swelling noted at site.  Band aid applied.  VSS during injection and discharge. Pt left ambulatory with no s/s of distress noted.

## 2021-01-31 NOTE — Patient Instructions (Signed)
[facility]  Discharge Instructions: Thank you for choosing [facility] to provide your oncology and hematology care.  If you have a lab appointment with the [facility], please come in thru the Main Entrance and check in at the main information desk.  You received your sandostatin injection today.  Wear comfortable clothing and clothing appropriate for easy access to any Portacath or PICC line.   We strive to give you quality time with your provider. You may need to reschedule your appointment if you arrive late (15 or more minutes).  Arriving late affects you and other patients whose appointments are after yours.  Also, if you miss three or more appointments without notifying the office, you may be dismissed from the clinic at the provider's discretion.      For prescription refill requests, have your pharmacy contact our office and allow 72 hours for refills to be completed.    Today you received the following chemotherapy and/or immunotherapy agents       To help prevent nausea and vomiting after your treatment, we encourage you to take your nausea medication as directed.  BELOW ARE SYMPTOMS THAT SHOULD BE REPORTED IMMEDIATELY: *FEVER GREATER THAN 100.4 F (38 C) OR HIGHER *CHILLS OR SWEATING *NAUSEA AND VOMITING THAT IS NOT CONTROLLED WITH YOUR NAUSEA MEDICATION *UNUSUAL SHORTNESS OF BREATH *UNUSUAL BRUISING OR BLEEDING *URINARY PROBLEMS (pain or burning when urinating, or frequent urination) *BOWEL PROBLEMS (unusual diarrhea, constipation, pain near the anus) TENDERNESS IN MOUTH AND THROAT WITH OR WITHOUT PRESENCE OF ULCERS (sore throat, sores in mouth, or a toothache) UNUSUAL RASH, SWELLING OR PAIN  UNUSUAL VAGINAL DISCHARGE OR ITCHING   Items with * indicate a potential emergency and should be followed up as soon as possible or go to the Emergency Department if any problems should occur.  Please show the CHEMOTHERAPY ALERT CARD or IMMUNOTHERAPY ALERT  CARD at check-in to the Emergency Department and triage nurse.  Should you have questions after your visit or need to cancel or reschedule your appointment, please contact [facility] [phone]  and follow the prompts.  Office hours are 8:00 a.m. to 4:30 p.m. Monday - Friday. Please note that voicemails left after 4:00 p.m. may not be returned until the following business day.  We are closed weekends and major holidays. You have access to a nurse at all times for urgent questions. Please call the main number to the clinic [phone] and follow the prompts.  For any non-urgent questions, you may also contact your provider using MyChart. We now offer e-Visits for anyone 19 and older to request care online for non-urgent symptoms. For details visit [email or web address].   Also download the MyChart app! Go to the app store, search "MyChart", open the app, select [facility], and log in with your MyChart username and password.  Due to Covid, a mask is required upon entering the hospital/clinic. If you do not have a mask, one will be given to you upon arrival. For doctor visits, patients may have 1 support person aged 52 or older with them. For treatment visits, patients cannot have anyone with them due to current Covid guidelines and our immunocompromised population.

## 2021-01-31 NOTE — Progress Notes (Signed)
[place], [place] [address]   CLINIC:  Medical Oncology/Hematology  PCP:  [name], MD [place] / [place] [address] [phone]   REASON FOR VISIT:  Follow-up for metastatic carcinoid tumor of small intestine  PRIOR THERAPY: None  NGS Results: Not done  CURRENT THERAPY: Everolimus daily & Sandostatin monthly  BRIEF ONCOLOGIC HISTORY:  Oncology History   No history exists.    CANCER STAGING: Cancer Staging No matching staging information was found for the patient.  INTERVAL HISTORY:  Allen Davenport, a 80 y.o. male, returns for routine follow-up of his metastatic carcinoid tumor of small intestine. Allen Davenport was last seen on  11/08/2020.   Today, he reports that he is doing well. His energy and appetite are overall stable. He is able to complete his ADLs on his own. He denies any weight changes. Patient denies any nausea, vomiting or abdominal pain. His bowl movements are regular without diarrhea or constipation. Patient continues to to hemodialysis three times a week. He is taking everolimus daily without any noticeable side effects. He denies fevers, chills, night sweats, shortness of breath, chest pain, flushing, palpitations, rash, easy bruising or signs of bleeding. He has no other complaints.    REVIEW OF SYSTEMS:  Review of Systems  Constitutional:  Positive for appetite change (75%) and fatigue (75%).  HENT:   Negative for mouth sores.   Respiratory:  Negative for shortness of breath and wheezing.   Cardiovascular:  Negative for chest pain.  Gastrointestinal:  Negative for abdominal pain, blood in stool, constipation, diarrhea, nausea and vomiting.  Skin:  Negative for rash.  Neurological:  Negative for dizziness and headaches.  All other systems reviewed and are negative.  PAST MEDICAL/SURGICAL HISTORY:  Past Medical History:  Diagnosis Date   Anemia    Cancer ([facility])    right renal . Prostate-  Radiation treatment   CHF (congestive heart failure) (HCC)    Chronic kidney disease    Dialysis T/TH/Sa   Edema    Heart murmur    "nothing to worry about"   Hyperlipidemia    Hypertension    Malignant carcinoid tumor ([facility] [place]) 01/03/2016   Pneumonia    as a child   Past Surgical History:  Procedure Laterality Date   AV FISTULA PLACEMENT Left 10/20/2014   Procedure: Left Arm ARTERIOVENOUS (AV) FISTULA CREATION;  Surgeon: Elam Dutch, MD;  Location: [facility];  Service: Vascular;  Laterality: Left;   NEPHRECTOMY Right 2010   PERIPHERAL VASCULAR BALLOON ANGIOPLASTY  06/20/2019   Procedure: PERIPHERAL VASCULAR BALLOON ANGIOPLASTY;  Surgeon: Elam Dutch, MD;  Location: [facility];  Service: Cardiovascular;;   REVISON OF ARTERIOVENOUS FISTULA Left 08/04/2019   Procedure: REVISON OF ARTERIOVENOUS FISTULA WITH SIDE BRANCH LIGATION;  Surgeon: Elam Dutch, MD;  Location: [facility];  Service: Vascular;  Laterality: Left;    SOCIAL HISTORY:  Social History   Socioeconomic History   Marital status: Married    Spouse name: Not on file   Number of children: Not on file   Years of education: Not on file   Highest education level: Not on file  Occupational History   Not on file  Tobacco Use   Smoking status: Never   Smokeless tobacco: Never  Vaping Use   Vaping Use: Never used  Substance and Sexual Activity   Alcohol use: No    Alcohol/week: 0.0 standard drinks   Drug use:  No   Sexual activity: Not on file  Other Topics Concern   Not on file  Social History Narrative   Not on file   Social Determinants of Health   Financial Resource Strain: Low Risk    Difficulty of Paying Living Expenses: Not hard at all  Food Insecurity: No Food Insecurity   Worried About Running Out of Food in the Last Year: Never true   [place] in the Last Year: Never true  Transportation Needs: No Transportation Needs   Lack of Transportation (Medical): No   Lack of Transportation  (Non-Medical): No  Physical Activity: Insufficiently Active   Days of Exercise per Week: 7 days   Minutes of Exercise per Session: 10 min  Stress: No Stress Concern Present   Feeling of Stress : Not at all  Social Connections: Moderately Integrated   Frequency of Communication with Friends and Family: More than three times a week   Frequency of Social Gatherings with Friends and Family: Once a week   Attends Religious Services: More than 4 times per year   Active Member of [employer] or Organizations: No   Attends Music therapist: Never   Marital Status: Married  Human resources officer Violence: Not At Risk   Fear of Current or Ex-Partner: No   Emotionally Abused: No   Physically Abused: No   Sexually Abused: No    FAMILY HISTORY:  Family History  Problem Relation Age of Onset   Cancer Mother    Hypertension Father    Cancer Sister     CURRENT MEDICATIONS:  Current Outpatient Medications  Medication Sig Dispense Refill   atorvastatin (LIPITOR) 80 MG tablet Take 80 mg by mouth daily at 6 PM.      calcitRIOL (ROCALTROL) 0.25 MCG capsule Take 0.25 mcg by mouth daily.      carvedilol (COREG) 3.125 MG tablet Take 3.125 mg by mouth 2 (two) times daily with a meal.      everolimus (AFINITOR) 5 MG tablet Take 1 tablet (5 mg total) by mouth daily. 28 tablet 6   furosemide (LASIX) 40 MG tablet Take 40 mg by mouth every morning.     octreotide (SANDOSTATIN LAR) 30 MG injection Inject 30 mg into the muscle every 28 (twenty-eight) days.      Omega-3 Fatty Acids (FISH OIL) 1000 MG CAPS Take 1,000 mg by mouth daily.      pyridOXINE (B-6) 50 MG tablet Take 1 tablet (50 mg total) by mouth daily. 30 tablet 8   sodium bicarbonate 650 MG tablet Take 650 mg by mouth daily.     No current facility-administered medications for this visit.    ALLERGIES:  No Known Allergies  PHYSICAL EXAM:  Performance status (ECOG): 1 - Symptomatic but completely ambulatory  Vitals:   01/31/21 1019   BP: 110/66  Pulse: 71  Resp: 19  Temp: (!) 96.9 F (36.1 C)  SpO2: 98%   Wt Readings from Last 3 Encounters:  01/31/21 184 lb 11.2 oz (83.8 kg)  01/03/21 186 lb 12.8 oz (84.7 kg)  12/06/20 189 lb 1.6 oz (85.8 kg)   Physical Exam Vitals reviewed.  Constitutional:      Appearance: Normal appearance.  HENT:     Head: Atraumatic.  Cardiovascular:     Rate and Rhythm: Normal rate and regular rhythm.     Pulses: Normal pulses.     Heart sounds: Normal heart sounds.  Pulmonary:     Effort: Pulmonary effort is normal.  Breath sounds: Normal breath sounds.  Abdominal:     General: Abdomen is flat. Bowel sounds are normal.     Palpations: Abdomen is soft.     Tenderness: There is no abdominal tenderness.  Skin:    General: Skin is warm and dry.  Neurological:     General: No focal deficit present.     Mental Status: He is alert and oriented to person, place, and time.  Psychiatric:        Mood and Affect: Mood normal.        Behavior: Behavior normal.     LABORATORY DATA:  I have reviewed the labs as listed.  CBC Latest Ref Rng & Units 01/03/2021 11/01/2020 09/13/2020  WBC 4.0 - 10.5 K/uL 3.8(L) 3.7(L) 3.8(L)  Hemoglobin 13.0 - 17.0 g/dL 13.4 11.8(L) 10.7(L)  Hematocrit 39.0 - 52.0 % 43.4 39.8 36.0(L)  Platelets 150 - 400 K/uL 124(L) 120(L) 122(L)   CMP Latest Ref Rng & Units 01/03/2021 11/01/2020 09/13/2020  Glucose 70 - 99 mg/dL 159(H) 124(H) 125(H)  BUN 8 - 23 mg/dL 36(H) 39(H) 28(H)  Creatinine 0.61 - 1.24 mg/dL 7.21(H) 7.52(H) 7.33(H)  Sodium 135 - 145 mmol/L 136 139 139  Potassium 3.5 - 5.1 mmol/L 3.8 3.6 3.6  Chloride 98 - 111 mmol/L 97(L) 100 98  CO2 22 - 32 mmol/L 28 28 29   Calcium 8.9 - 10.3 mg/dL 8.9 8.8(L) 8.7(L)  Total Protein 6.5 - 8.1 g/dL 6.0(L) 6.2(L) 5.7(L)  Total Bilirubin 0.3 - 1.2 mg/dL 0.6 0.7 0.6  Alkaline Phos 38 - 126 U/L 78 80 62  AST 15 - 41 U/L 35 33 30  ALT 0 - 44 U/L 21 21 17     DIAGNOSTIC IMAGING:  I have independently reviewed the  scans and discussed with the patient. No results found.   ASSESSMENT:  1.  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  Everolimus 5 mg daily started on 05/17/2018. -PET scan on 10/29/2019 showed multifocal well-differentiated neuroendocrine tumor with intense avid lesions in the liver, mesentery, peritoneal space.  No clear increase in size of the lesions.  Many lesions have increased in radiotracer activity. -PET scan on 09/08/2020 showed no change in multifocal neuroendocrine tumor hepatic metastasis with no changes in multifocal mesenteric and peritoneal nodule or metastatic tumors.  Resolution of the right lung base pulmonary nodule.  No evidence of new metastatic disease.   2.  Normocytic anemia: -This is from combination of end-stage renal disease and relative iron deficiency and myelosuppression from everolimus.   3.  ESRD on HD: -Started on HD on 05/20/2019.   PLAN:  1.  Malignant carcinoid tumor to the liver and peritoneum: -[facility] PET scan on 09/08/2020 showed stable disease with no new sites of metastatic disease. - He does not report any signs or symptoms of carcinoid syndrome. - Reviewed labs from 01/03/2021 which showed normal LFTs.  CBC with white count 3.8 and ANC normal.  Platelet count stable at 124.  - Serum chromogranin level is 565.4  Chromogranin has been elevated anywhere between 300-700. - Continue everolimus and monthly Sandostatin injections. - RTC 2 months for follow-up with labs and office visit. Plan for repeat PET imaging around October 2022.    2.  Normocytic anemia: -Hemoglobin is normal.  Continue Retacrit with dialysis.   3.  ESRD on HD: -Continue HD on Tuesday, Thursday and Saturday.  4.  High risk drug monitoring: - He does not have any mucositis  or hand-foot skin reaction.  No severe cytopenias from everolimus.    Orders placed this encounter:  No orders of the defined types were placed in  this encounter.  Patient expressed understanding and satisfaction with the plan provided  I have spent a total of 25 minutes minutes of face-to-face and non-face-to-face time, preparing to see the patient, obtaining and/or reviewing separately obtained history, performing a medically appropriate examination, counseling and educating the patient, documenting clinical information in the electronic health record, and care coordination.   Lincoln Brigham PA-C Hematology and [place]

## 2021-02-01 DIAGNOSIS — N186 End stage renal disease: Secondary | ICD-10-CM | POA: Diagnosis not present

## 2021-02-01 DIAGNOSIS — N250 Renal osteodystrophy: Secondary | ICD-10-CM | POA: Diagnosis not present

## 2021-02-01 DIAGNOSIS — D631 Anemia in chronic kidney disease: Secondary | ICD-10-CM | POA: Diagnosis not present

## 2021-02-01 DIAGNOSIS — Z992 Dependence on renal dialysis: Secondary | ICD-10-CM | POA: Diagnosis not present

## 2021-02-01 DIAGNOSIS — D509 Iron deficiency anemia, unspecified: Secondary | ICD-10-CM | POA: Diagnosis not present

## 2021-02-03 DIAGNOSIS — N186 End stage renal disease: Secondary | ICD-10-CM | POA: Diagnosis not present

## 2021-02-03 DIAGNOSIS — D631 Anemia in chronic kidney disease: Secondary | ICD-10-CM | POA: Diagnosis not present

## 2021-02-03 DIAGNOSIS — N250 Renal osteodystrophy: Secondary | ICD-10-CM | POA: Diagnosis not present

## 2021-02-03 DIAGNOSIS — Z992 Dependence on renal dialysis: Secondary | ICD-10-CM | POA: Diagnosis not present

## 2021-02-03 DIAGNOSIS — D509 Iron deficiency anemia, unspecified: Secondary | ICD-10-CM | POA: Diagnosis not present

## 2021-02-05 DIAGNOSIS — N250 Renal osteodystrophy: Secondary | ICD-10-CM | POA: Diagnosis not present

## 2021-02-05 DIAGNOSIS — D509 Iron deficiency anemia, unspecified: Secondary | ICD-10-CM | POA: Diagnosis not present

## 2021-02-05 DIAGNOSIS — D631 Anemia in chronic kidney disease: Secondary | ICD-10-CM | POA: Diagnosis not present

## 2021-02-05 DIAGNOSIS — N186 End stage renal disease: Secondary | ICD-10-CM | POA: Diagnosis not present

## 2021-02-05 DIAGNOSIS — Z992 Dependence on renal dialysis: Secondary | ICD-10-CM | POA: Diagnosis not present

## 2021-02-07 ENCOUNTER — Other Ambulatory Visit (HOSPITAL_COMMUNITY): Payer: Self-pay

## 2021-02-07 ENCOUNTER — Telehealth (HOSPITAL_COMMUNITY): Payer: Self-pay | Admitting: Dietician

## 2021-02-07 DIAGNOSIS — C179 Malignant neoplasm of small intestine, unspecified: Secondary | ICD-10-CM

## 2021-02-07 DIAGNOSIS — C7A019 Malignant carcinoid tumor of the small intestine, unspecified portion: Secondary | ICD-10-CM

## 2021-02-07 DIAGNOSIS — C7A00 Malignant carcinoid tumor of unspecified site: Secondary | ICD-10-CM

## 2021-02-07 NOTE — Telephone Encounter (Signed)
Nutrition  Patient identified on MST.  Attempted to contact patient via telephone to introduce self and services available at [facility]. Patient did not answer. No voicemail available to leave message. Will attempt to contact patient at a later time.

## 2021-02-08 DIAGNOSIS — D509 Iron deficiency anemia, unspecified: Secondary | ICD-10-CM | POA: Diagnosis not present

## 2021-02-08 DIAGNOSIS — N186 End stage renal disease: Secondary | ICD-10-CM | POA: Diagnosis not present

## 2021-02-08 DIAGNOSIS — D631 Anemia in chronic kidney disease: Secondary | ICD-10-CM | POA: Diagnosis not present

## 2021-02-08 DIAGNOSIS — N250 Renal osteodystrophy: Secondary | ICD-10-CM | POA: Diagnosis not present

## 2021-02-08 DIAGNOSIS — Z992 Dependence on renal dialysis: Secondary | ICD-10-CM | POA: Diagnosis not present

## 2021-02-10 DIAGNOSIS — N186 End stage renal disease: Secondary | ICD-10-CM | POA: Diagnosis not present

## 2021-02-10 DIAGNOSIS — N250 Renal osteodystrophy: Secondary | ICD-10-CM | POA: Diagnosis not present

## 2021-02-10 DIAGNOSIS — D509 Iron deficiency anemia, unspecified: Secondary | ICD-10-CM | POA: Diagnosis not present

## 2021-02-10 DIAGNOSIS — Z992 Dependence on renal dialysis: Secondary | ICD-10-CM | POA: Diagnosis not present

## 2021-02-10 DIAGNOSIS — D631 Anemia in chronic kidney disease: Secondary | ICD-10-CM | POA: Diagnosis not present

## 2021-02-12 DIAGNOSIS — N250 Renal osteodystrophy: Secondary | ICD-10-CM | POA: Diagnosis not present

## 2021-02-12 DIAGNOSIS — D631 Anemia in chronic kidney disease: Secondary | ICD-10-CM | POA: Diagnosis not present

## 2021-02-12 DIAGNOSIS — N186 End stage renal disease: Secondary | ICD-10-CM | POA: Diagnosis not present

## 2021-02-12 DIAGNOSIS — Z992 Dependence on renal dialysis: Secondary | ICD-10-CM | POA: Diagnosis not present

## 2021-02-12 DIAGNOSIS — D509 Iron deficiency anemia, unspecified: Secondary | ICD-10-CM | POA: Diagnosis not present

## 2021-02-13 DIAGNOSIS — Z992 Dependence on renal dialysis: Secondary | ICD-10-CM | POA: Diagnosis not present

## 2021-02-13 DIAGNOSIS — N186 End stage renal disease: Secondary | ICD-10-CM | POA: Diagnosis not present

## 2021-02-15 DIAGNOSIS — D631 Anemia in chronic kidney disease: Secondary | ICD-10-CM | POA: Diagnosis not present

## 2021-02-15 DIAGNOSIS — N2581 Secondary hyperparathyroidism of renal origin: Secondary | ICD-10-CM | POA: Diagnosis not present

## 2021-02-15 DIAGNOSIS — N186 End stage renal disease: Secondary | ICD-10-CM | POA: Diagnosis not present

## 2021-02-15 DIAGNOSIS — D509 Iron deficiency anemia, unspecified: Secondary | ICD-10-CM | POA: Diagnosis not present

## 2021-02-15 DIAGNOSIS — Z992 Dependence on renal dialysis: Secondary | ICD-10-CM | POA: Diagnosis not present

## 2021-02-17 DIAGNOSIS — N186 End stage renal disease: Secondary | ICD-10-CM | POA: Diagnosis not present

## 2021-02-17 DIAGNOSIS — N2581 Secondary hyperparathyroidism of renal origin: Secondary | ICD-10-CM | POA: Diagnosis not present

## 2021-02-17 DIAGNOSIS — D631 Anemia in chronic kidney disease: Secondary | ICD-10-CM | POA: Diagnosis not present

## 2021-02-17 DIAGNOSIS — Z992 Dependence on renal dialysis: Secondary | ICD-10-CM | POA: Diagnosis not present

## 2021-02-17 DIAGNOSIS — D509 Iron deficiency anemia, unspecified: Secondary | ICD-10-CM | POA: Diagnosis not present

## 2021-02-19 DIAGNOSIS — D631 Anemia in chronic kidney disease: Secondary | ICD-10-CM | POA: Diagnosis not present

## 2021-02-19 DIAGNOSIS — D509 Iron deficiency anemia, unspecified: Secondary | ICD-10-CM | POA: Diagnosis not present

## 2021-02-19 DIAGNOSIS — Z992 Dependence on renal dialysis: Secondary | ICD-10-CM | POA: Diagnosis not present

## 2021-02-19 DIAGNOSIS — N2581 Secondary hyperparathyroidism of renal origin: Secondary | ICD-10-CM | POA: Diagnosis not present

## 2021-02-19 DIAGNOSIS — N186 End stage renal disease: Secondary | ICD-10-CM | POA: Diagnosis not present

## 2021-02-22 DIAGNOSIS — Z992 Dependence on renal dialysis: Secondary | ICD-10-CM | POA: Diagnosis not present

## 2021-02-22 DIAGNOSIS — D631 Anemia in chronic kidney disease: Secondary | ICD-10-CM | POA: Diagnosis not present

## 2021-02-22 DIAGNOSIS — N186 End stage renal disease: Secondary | ICD-10-CM | POA: Diagnosis not present

## 2021-02-22 DIAGNOSIS — N2581 Secondary hyperparathyroidism of renal origin: Secondary | ICD-10-CM | POA: Diagnosis not present

## 2021-02-22 DIAGNOSIS — D509 Iron deficiency anemia, unspecified: Secondary | ICD-10-CM | POA: Diagnosis not present

## 2021-02-24 DIAGNOSIS — D509 Iron deficiency anemia, unspecified: Secondary | ICD-10-CM | POA: Diagnosis not present

## 2021-02-24 DIAGNOSIS — N186 End stage renal disease: Secondary | ICD-10-CM | POA: Diagnosis not present

## 2021-02-24 DIAGNOSIS — Z992 Dependence on renal dialysis: Secondary | ICD-10-CM | POA: Diagnosis not present

## 2021-02-24 DIAGNOSIS — D631 Anemia in chronic kidney disease: Secondary | ICD-10-CM | POA: Diagnosis not present

## 2021-02-24 DIAGNOSIS — N2581 Secondary hyperparathyroidism of renal origin: Secondary | ICD-10-CM | POA: Diagnosis not present

## 2021-02-26 DIAGNOSIS — D509 Iron deficiency anemia, unspecified: Secondary | ICD-10-CM | POA: Diagnosis not present

## 2021-02-26 DIAGNOSIS — D631 Anemia in chronic kidney disease: Secondary | ICD-10-CM | POA: Diagnosis not present

## 2021-02-26 DIAGNOSIS — N186 End stage renal disease: Secondary | ICD-10-CM | POA: Diagnosis not present

## 2021-02-26 DIAGNOSIS — Z992 Dependence on renal dialysis: Secondary | ICD-10-CM | POA: Diagnosis not present

## 2021-02-26 DIAGNOSIS — N2581 Secondary hyperparathyroidism of renal origin: Secondary | ICD-10-CM | POA: Diagnosis not present

## 2021-02-28 ENCOUNTER — Other Ambulatory Visit: Payer: Self-pay

## 2021-02-28 ENCOUNTER — Inpatient Hospital Stay (HOSPITAL_COMMUNITY): Payer: Medicare Other | Attending: Hematology

## 2021-02-28 ENCOUNTER — Encounter (HOSPITAL_COMMUNITY): Payer: Self-pay

## 2021-02-28 VITALS — BP 119/62 | HR 88 | Temp 97.1°F | Resp 18

## 2021-02-28 DIAGNOSIS — C7B02 Secondary carcinoid tumors of liver: Secondary | ICD-10-CM | POA: Insufficient documentation

## 2021-02-28 DIAGNOSIS — C7A00 Malignant carcinoid tumor of unspecified site: Secondary | ICD-10-CM

## 2021-02-28 MED ORDER — OCTREOTIDE ACETATE 30 MG IM KIT
30.0000 mg | PACK | Freq: Once | INTRAMUSCULAR | Status: AC
  Administered 2021-02-28: 30 mg via INTRAMUSCULAR
  Filled 2021-02-28: qty 1

## 2021-02-28 NOTE — Patient Instructions (Signed)
[facility]  Discharge Instructions: Thank you for choosing [facility] to provide your oncology and hematology care.  If you have a lab appointment with the Cancer Center, please come in thru the Main Entrance and check in at the main information desk.  Wear comfortable clothing and clothing appropriate for easy access to any Portacath or PICC line.   We strive to give you quality time with your provider. You may need to reschedule your appointment if you arrive late (15 or more minutes).  Arriving late affects you and other patients whose appointments are after yours.  Also, if you miss three or more appointments without notifying the office, you may be dismissed from the clinic at the provider's discretion.      For prescription refill requests, have your pharmacy contact our office and allow 72 hours for refills to be completed.    Today you received the following chemotherapy and/or immunotherapy agents sandostatin.       To help prevent nausea and vomiting after your treatment, we encourage you to take your nausea medication as directed.  BELOW ARE SYMPTOMS THAT SHOULD BE REPORTED IMMEDIATELY: *FEVER GREATER THAN 100.4 F (38 C) OR HIGHER *CHILLS OR SWEATING *NAUSEA AND VOMITING THAT IS NOT CONTROLLED WITH YOUR NAUSEA MEDICATION *UNUSUAL SHORTNESS OF BREATH *UNUSUAL BRUISING OR BLEEDING *URINARY PROBLEMS (pain or burning when urinating, or frequent urination) *BOWEL PROBLEMS (unusual diarrhea, constipation, pain near the anus) TENDERNESS IN MOUTH AND THROAT WITH OR WITHOUT PRESENCE OF ULCERS (sore throat, sores in mouth, or a toothache) UNUSUAL RASH, SWELLING OR PAIN  UNUSUAL VAGINAL DISCHARGE OR ITCHING   Items with * indicate a potential emergency and should be followed up as soon as possible or go to the Emergency Department if any problems should occur.  Please show the CHEMOTHERAPY ALERT CARD or IMMUNOTHERAPY ALERT CARD at check-in to the Emergency  Department and triage nurse.  Should you have questions after your visit or need to cancel or reschedule your appointment, please contact [facility] [phone]  and follow the prompts.  Office hours are 8:00 a.m. to 4:30 p.m. Monday - Friday. Please note that voicemails left after 4:00 p.m. may not be returned until the following business day.  We are closed weekends and major holidays. You have access to a nurse at all times for urgent questions. Please call the main number to the clinic [phone] and follow the prompts.  For any non-urgent questions, you may also contact your provider using MyChart. We now offer e-Visits for anyone 18 and older to request care online for non-urgent symptoms. For details visit [email or web address].   Also download the MyChart app! Go to the app store, search "MyChart", open the app, select [place], and log in with your MyChart username and password.  Due to Covid, a mask is required upon entering the hospital/clinic. If you do not have a mask, one will be given to you upon arrival. For doctor visits, patients may have 1 support person aged 18 or older with them. For treatment visits, patients cannot have anyone with them due to current Covid guidelines and our immunocompromised population.  

## 2021-02-28 NOTE — Progress Notes (Signed)
Patient tolerated Sandostatin injection with no complaints voiced.  Patient stable during and after injection.  See MAR for details.  Site clean and dry with no bruising or swelling noted at site.  Band aid applied.  Vss with discharge and left in satisfactory condition with no s/s of distress noted.  

## 2021-03-01 DIAGNOSIS — D509 Iron deficiency anemia, unspecified: Secondary | ICD-10-CM | POA: Diagnosis not present

## 2021-03-01 DIAGNOSIS — D631 Anemia in chronic kidney disease: Secondary | ICD-10-CM | POA: Diagnosis not present

## 2021-03-01 DIAGNOSIS — N2581 Secondary hyperparathyroidism of renal origin: Secondary | ICD-10-CM | POA: Diagnosis not present

## 2021-03-01 DIAGNOSIS — N186 End stage renal disease: Secondary | ICD-10-CM | POA: Diagnosis not present

## 2021-03-01 DIAGNOSIS — Z992 Dependence on renal dialysis: Secondary | ICD-10-CM | POA: Diagnosis not present

## 2021-03-02 DIAGNOSIS — I10 Essential (primary) hypertension: Secondary | ICD-10-CM | POA: Diagnosis not present

## 2021-03-02 DIAGNOSIS — M818 Other osteoporosis without current pathological fracture: Secondary | ICD-10-CM | POA: Diagnosis not present

## 2021-03-02 DIAGNOSIS — N185 Chronic kidney disease, stage 5: Secondary | ICD-10-CM | POA: Diagnosis not present

## 2021-03-02 DIAGNOSIS — E7849 Other hyperlipidemia: Secondary | ICD-10-CM | POA: Diagnosis not present

## 2021-03-03 DIAGNOSIS — Z992 Dependence on renal dialysis: Secondary | ICD-10-CM | POA: Diagnosis not present

## 2021-03-03 DIAGNOSIS — N186 End stage renal disease: Secondary | ICD-10-CM | POA: Diagnosis not present

## 2021-03-03 DIAGNOSIS — D631 Anemia in chronic kidney disease: Secondary | ICD-10-CM | POA: Diagnosis not present

## 2021-03-03 DIAGNOSIS — D509 Iron deficiency anemia, unspecified: Secondary | ICD-10-CM | POA: Diagnosis not present

## 2021-03-03 DIAGNOSIS — N2581 Secondary hyperparathyroidism of renal origin: Secondary | ICD-10-CM | POA: Diagnosis not present

## 2021-03-05 DIAGNOSIS — Z992 Dependence on renal dialysis: Secondary | ICD-10-CM | POA: Diagnosis not present

## 2021-03-05 DIAGNOSIS — N186 End stage renal disease: Secondary | ICD-10-CM | POA: Diagnosis not present

## 2021-03-05 DIAGNOSIS — D631 Anemia in chronic kidney disease: Secondary | ICD-10-CM | POA: Diagnosis not present

## 2021-03-05 DIAGNOSIS — N2581 Secondary hyperparathyroidism of renal origin: Secondary | ICD-10-CM | POA: Diagnosis not present

## 2021-03-05 DIAGNOSIS — D509 Iron deficiency anemia, unspecified: Secondary | ICD-10-CM | POA: Diagnosis not present

## 2021-03-08 DIAGNOSIS — Z992 Dependence on renal dialysis: Secondary | ICD-10-CM | POA: Diagnosis not present

## 2021-03-08 DIAGNOSIS — D509 Iron deficiency anemia, unspecified: Secondary | ICD-10-CM | POA: Diagnosis not present

## 2021-03-08 DIAGNOSIS — D631 Anemia in chronic kidney disease: Secondary | ICD-10-CM | POA: Diagnosis not present

## 2021-03-08 DIAGNOSIS — N186 End stage renal disease: Secondary | ICD-10-CM | POA: Diagnosis not present

## 2021-03-08 DIAGNOSIS — N2581 Secondary hyperparathyroidism of renal origin: Secondary | ICD-10-CM | POA: Diagnosis not present

## 2021-03-10 DIAGNOSIS — D509 Iron deficiency anemia, unspecified: Secondary | ICD-10-CM | POA: Diagnosis not present

## 2021-03-10 DIAGNOSIS — Z992 Dependence on renal dialysis: Secondary | ICD-10-CM | POA: Diagnosis not present

## 2021-03-10 DIAGNOSIS — N186 End stage renal disease: Secondary | ICD-10-CM | POA: Diagnosis not present

## 2021-03-10 DIAGNOSIS — D631 Anemia in chronic kidney disease: Secondary | ICD-10-CM | POA: Diagnosis not present

## 2021-03-10 DIAGNOSIS — N2581 Secondary hyperparathyroidism of renal origin: Secondary | ICD-10-CM | POA: Diagnosis not present

## 2021-03-12 DIAGNOSIS — N2581 Secondary hyperparathyroidism of renal origin: Secondary | ICD-10-CM | POA: Diagnosis not present

## 2021-03-12 DIAGNOSIS — Z992 Dependence on renal dialysis: Secondary | ICD-10-CM | POA: Diagnosis not present

## 2021-03-12 DIAGNOSIS — D631 Anemia in chronic kidney disease: Secondary | ICD-10-CM | POA: Diagnosis not present

## 2021-03-12 DIAGNOSIS — D509 Iron deficiency anemia, unspecified: Secondary | ICD-10-CM | POA: Diagnosis not present

## 2021-03-12 DIAGNOSIS — N186 End stage renal disease: Secondary | ICD-10-CM | POA: Diagnosis not present

## 2021-03-15 DIAGNOSIS — N186 End stage renal disease: Secondary | ICD-10-CM | POA: Diagnosis not present

## 2021-03-15 DIAGNOSIS — N2581 Secondary hyperparathyroidism of renal origin: Secondary | ICD-10-CM | POA: Diagnosis not present

## 2021-03-15 DIAGNOSIS — D631 Anemia in chronic kidney disease: Secondary | ICD-10-CM | POA: Diagnosis not present

## 2021-03-15 DIAGNOSIS — D509 Iron deficiency anemia, unspecified: Secondary | ICD-10-CM | POA: Diagnosis not present

## 2021-03-15 DIAGNOSIS — Z992 Dependence on renal dialysis: Secondary | ICD-10-CM | POA: Diagnosis not present

## 2021-03-16 DIAGNOSIS — Z992 Dependence on renal dialysis: Secondary | ICD-10-CM | POA: Diagnosis not present

## 2021-03-16 DIAGNOSIS — N186 End stage renal disease: Secondary | ICD-10-CM | POA: Diagnosis not present

## 2021-03-17 DIAGNOSIS — D631 Anemia in chronic kidney disease: Secondary | ICD-10-CM | POA: Diagnosis not present

## 2021-03-17 DIAGNOSIS — N186 End stage renal disease: Secondary | ICD-10-CM | POA: Diagnosis not present

## 2021-03-17 DIAGNOSIS — Z992 Dependence on renal dialysis: Secondary | ICD-10-CM | POA: Diagnosis not present

## 2021-03-17 DIAGNOSIS — D509 Iron deficiency anemia, unspecified: Secondary | ICD-10-CM | POA: Diagnosis not present

## 2021-03-19 DIAGNOSIS — D631 Anemia in chronic kidney disease: Secondary | ICD-10-CM | POA: Diagnosis not present

## 2021-03-19 DIAGNOSIS — N186 End stage renal disease: Secondary | ICD-10-CM | POA: Diagnosis not present

## 2021-03-19 DIAGNOSIS — Z992 Dependence on renal dialysis: Secondary | ICD-10-CM | POA: Diagnosis not present

## 2021-03-19 DIAGNOSIS — D509 Iron deficiency anemia, unspecified: Secondary | ICD-10-CM | POA: Diagnosis not present

## 2021-03-22 DIAGNOSIS — E1151 Type 2 diabetes mellitus with diabetic peripheral angiopathy without gangrene: Secondary | ICD-10-CM | POA: Diagnosis not present

## 2021-03-22 DIAGNOSIS — E1140 Type 2 diabetes mellitus with diabetic neuropathy, unspecified: Secondary | ICD-10-CM | POA: Diagnosis not present

## 2021-03-22 DIAGNOSIS — Z992 Dependence on renal dialysis: Secondary | ICD-10-CM | POA: Diagnosis not present

## 2021-03-22 DIAGNOSIS — D509 Iron deficiency anemia, unspecified: Secondary | ICD-10-CM | POA: Diagnosis not present

## 2021-03-22 DIAGNOSIS — D631 Anemia in chronic kidney disease: Secondary | ICD-10-CM | POA: Diagnosis not present

## 2021-03-22 DIAGNOSIS — N186 End stage renal disease: Secondary | ICD-10-CM | POA: Diagnosis not present

## 2021-03-24 DIAGNOSIS — N186 End stage renal disease: Secondary | ICD-10-CM | POA: Diagnosis not present

## 2021-03-24 DIAGNOSIS — D509 Iron deficiency anemia, unspecified: Secondary | ICD-10-CM | POA: Diagnosis not present

## 2021-03-24 DIAGNOSIS — Z992 Dependence on renal dialysis: Secondary | ICD-10-CM | POA: Diagnosis not present

## 2021-03-24 DIAGNOSIS — D631 Anemia in chronic kidney disease: Secondary | ICD-10-CM | POA: Diagnosis not present

## 2021-03-26 DIAGNOSIS — Z992 Dependence on renal dialysis: Secondary | ICD-10-CM | POA: Diagnosis not present

## 2021-03-26 DIAGNOSIS — D631 Anemia in chronic kidney disease: Secondary | ICD-10-CM | POA: Diagnosis not present

## 2021-03-26 DIAGNOSIS — D509 Iron deficiency anemia, unspecified: Secondary | ICD-10-CM | POA: Diagnosis not present

## 2021-03-26 DIAGNOSIS — N186 End stage renal disease: Secondary | ICD-10-CM | POA: Diagnosis not present

## 2021-03-26 NOTE — Progress Notes (Signed)
[place], [place] [address]   CLINIC:  Medical Oncology/Hematology  PCP:  [name], MD [place] / [place] [address] [phone]   REASON FOR VISIT:  Follow-up for metastatic carcinoid tumor of small intestine  PRIOR THERAPY: none  NGS Results: not done  CURRENT THERAPY: Everolimus daily & Sandostatin monthly  BRIEF ONCOLOGIC HISTORY:  Oncology History   No history exists.    CANCER STAGING: Cancer Staging No matching staging information was found for the patient.  INTERVAL HISTORY:  [name], a 80 y.o. male, returns for routine follow-up of his metastatic carcinoid tumor of small intestine. Owais was last seen on 11/08/2020.   Today he reports feeling good. He denies flushing, wheezing, mouth sores, n/v/d.   REVIEW OF SYSTEMS:  Review of Systems  Constitutional:  Negative for appetite change (75%) and fatigue (75%).  HENT:   Negative for mouth sores.   Respiratory:  Negative for wheezing.   Gastrointestinal:  Negative for diarrhea, nausea and vomiting.  All other systems reviewed and are negative.  PAST MEDICAL/SURGICAL HISTORY:  Past Medical History:  Diagnosis Date   Anemia    Cancer ([place])    right renal . Prostate- Radiation treatment   CHF (congestive heart failure) (HCC)    Chronic kidney disease    Dialysis T/TH/Sa   Edema    Heart murmur    "nothing to worry about"   Hyperlipidemia    Hypertension    Malignant carcinoid tumor ([place]) 01/03/2016   Pneumonia    as a child   Past Surgical History:  Procedure Laterality Date   AV FISTULA PLACEMENT Left 10/20/2014   Procedure: Left Arm ARTERIOVENOUS (AV) FISTULA CREATION;  Surgeon: Elam Dutch, MD;  Location: [facility];  Service: Vascular;  Laterality: Left;   NEPHRECTOMY Right 2010   PERIPHERAL VASCULAR BALLOON ANGIOPLASTY  06/20/2019   Procedure: PERIPHERAL VASCULAR BALLOON ANGIOPLASTY;  Surgeon: Elam Dutch, MD;  Location:  [facility];  Service: Cardiovascular;;   REVISON OF ARTERIOVENOUS FISTULA Left 08/04/2019   Procedure: REVISON OF ARTERIOVENOUS FISTULA WITH SIDE BRANCH LIGATION;  Surgeon: Elam Dutch, MD;  Location: [facility];  Service: Vascular;  Laterality: Left;    SOCIAL HISTORY:  Social History   Socioeconomic History   Marital status: Married    Spouse name: Not on file   Number of children: Not on file   Years of education: Not on file   Highest education level: Not on file  Occupational History   Not on file  Tobacco Use   Smoking status: Never   Smokeless tobacco: Never  Vaping Use   Vaping Use: Never used  Substance and Sexual Activity   Alcohol use: No    Alcohol/week: 0.0 standard drinks   Drug use: No   Sexual activity: Not on file  Other Topics Concern   Not on file  Social History Narrative   Not on file   Social Determinants of Health   Financial Resource Strain: Low Risk    Difficulty of Paying Living Expenses: Not hard at all  Food Insecurity: No Food Insecurity   Worried About Charity fundraiser in the Last Year: Never true   Ran Out of Food in the Last Year: Never true  Transportation Needs: No Transportation Needs   Lack of Transportation (Medical): No   Lack of Transportation (Non-Medical): No  Physical Activity: Insufficiently Active   Days of  Exercise per Week: 7 days   Minutes of Exercise per Session: 10 min  Stress: No Stress Concern Present   Feeling of Stress : Not at all  Social Connections: Moderately Integrated   Frequency of Communication with Friends and Family: More than three times a week   Frequency of Social Gatherings with Friends and Family: Once a week   Attends Religious Services: More than 4 times per year   Active Member of [employer] or Organizations: No   Attends Music therapist: Never   Marital Status: Married  Human resources officer Violence: Not At Risk   Fear of Current or Ex-Partner: No   Emotionally Abused: No    Physically Abused: No   Sexually Abused: No    FAMILY HISTORY:  Family History  Problem Relation Age of Onset   Cancer Mother    Hypertension Father    Cancer Sister     CURRENT MEDICATIONS:  Current Outpatient Medications  Medication Sig Dispense Refill   atorvastatin (LIPITOR) 80 MG tablet Take 80 mg by mouth daily at 6 PM.      calcitRIOL (ROCALTROL) 0.25 MCG capsule Take 0.25 mcg by mouth daily.      carvedilol (COREG) 3.125 MG tablet Take 3.125 mg by mouth 2 (two) times daily with a meal.      everolimus (AFINITOR) 5 MG tablet Take 1 tablet (5 mg total) by mouth daily. 28 tablet 6   furosemide (LASIX) 40 MG tablet Take 40 mg by mouth every morning.     octreotide (SANDOSTATIN LAR) 30 MG injection Inject 30 mg into the muscle every 28 (twenty-eight) days.      Omega-3 Fatty Acids (FISH OIL) 1000 MG CAPS Take 1,000 mg by mouth daily.      pyridOXINE (B-6) 50 MG tablet Take 1 tablet (50 mg total) by mouth daily. 30 tablet 8   sodium bicarbonate 650 MG tablet Take 650 mg by mouth daily.     No current facility-administered medications for this visit.    ALLERGIES:  No Known Allergies  PHYSICAL EXAM:  Performance status (ECOG): 1 - Symptomatic but completely ambulatory  There were no vitals filed for this visit. Wt Readings from Last 3 Encounters:  01/31/21 184 lb 11.2 oz (83.8 kg)  01/03/21 186 lb 12.8 oz (84.7 kg)  12/06/20 189 lb 1.6 oz (85.8 kg)   Physical Exam Vitals reviewed.  Constitutional:      Appearance: Normal appearance.  Cardiovascular:     Rate and Rhythm: Normal rate and regular rhythm.     Pulses: Normal pulses.     Heart sounds: Normal heart sounds.  Pulmonary:     Effort: Pulmonary effort is normal.     Breath sounds: Normal breath sounds.  Neurological:     General: No focal deficit present.     Mental Status: He is alert and oriented to person, place, and time.  Psychiatric:        Mood and Affect: Mood normal.        Behavior: Behavior  normal.     LABORATORY DATA:  I have reviewed the labs as listed.  CBC Latest Ref Rng & Units 01/03/2021 11/01/2020 09/13/2020  WBC 4.0 - 10.5 K/uL 3.8(L) 3.7(L) 3.8(L)  Hemoglobin 13.0 - 17.0 g/dL 13.4 11.8(L) 10.7(L)  Hematocrit 39.0 - 52.0 % 43.4 39.8 36.0(L)  Platelets 150 - 400 K/uL 124(L) 120(L) 122(L)   CMP Latest Ref Rng & Units 01/03/2021 11/01/2020 09/13/2020  Glucose 70 - 99 mg/dL 159(H)  124(H) 125(H)  BUN 8 - 23 mg/dL 36(H) 39(H) 28(H)  Creatinine 0.61 - 1.24 mg/dL 7.21(H) 7.52(H) 7.33(H)  Sodium 135 - 145 mmol/L 136 139 139  Potassium 3.5 - 5.1 mmol/L 3.8 3.6 3.6  Chloride 98 - 111 mmol/L 97(L) 100 98  CO2 22 - 32 mmol/L 28 28 29   Calcium 8.9 - 10.3 mg/dL 8.9 8.8(L) 8.7(L)  Total Protein 6.5 - 8.1 g/dL 6.0(L) 6.2(L) 5.7(L)  Total Bilirubin 0.3 - 1.2 mg/dL 0.6 0.7 0.6  Alkaline Phos 38 - 126 U/L 78 80 62  AST 15 - 41 U/L 35 33 30  ALT 0 - 44 U/L 21 21 17     DIAGNOSTIC IMAGING:  I have independently reviewed the scans and discussed with the patient. No results found.   ASSESSMENT:  1.  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  Everolimus 5 mg daily started on 05/17/2018. -PET scan on 10/29/2019 showed multifocal well-differentiated neuroendocrine tumor with intense avid lesions in the liver, mesentery, peritoneal space.  No clear increase in size of the lesions.  Many lesions have increased in radiotracer activity. -PET scan on 09/08/2020 showed no change in multifocal neuroendocrine tumor hepatic metastasis with no changes in multifocal mesenteric and peritoneal nodule or metastatic tumors.  Resolution of the right lung base pulmonary nodule.  No evidence of new metastatic disease.   2.  Normocytic anemia: -This is from combination of end-stage renal disease and relative iron deficiency and myelosuppression from everolimus.   3.  ESRD on HD: -Started on HD on 05/20/2019   PLAN:  1.  Malignant carcinoid tumor to  the liver and peritoneum: - PET scan on 09/08/2020 showed stable disease with no new sites of metastatic disease. - Chromogranin level is around 500. - He does not have any clinical signs or symptoms of carcinoid syndrome. - Continue monthly Sandostatin injections. - Continue everolimus 5 mg daily. - RTC 2 months for follow-up.  We will consider repeating PET scan if there is any significant change in tumor marker.   2.  Normocytic anemia: - Hemoglobin is 10.5.  Continue Retacrit with dialysis.   3.  ESRD on HD: - Continue HD on Tuesday, Thursday and Saturday.   4.  High risk drug monitoring: - He does not have any mucositis or hand-foot skin reaction. - No severe cytopenias from everolimus.   Orders placed this encounter:  No orders of the defined types were placed in this encounter.    Derek Jack, MD [place] [phone]   I, Thana Ates, am acting as a scribe for Dr. Derek Jack.  I, Derek Jack MD, have reviewed the above documentation for accuracy and completeness, and I agree with the above.

## 2021-03-28 ENCOUNTER — Inpatient Hospital Stay (HOSPITAL_COMMUNITY): Payer: Medicare Other

## 2021-03-28 ENCOUNTER — Inpatient Hospital Stay (HOSPITAL_BASED_OUTPATIENT_CLINIC_OR_DEPARTMENT_OTHER): Payer: Medicare Other | Admitting: Hematology

## 2021-03-28 ENCOUNTER — Inpatient Hospital Stay (HOSPITAL_COMMUNITY): Payer: Medicare Other | Attending: Hematology

## 2021-03-28 ENCOUNTER — Other Ambulatory Visit: Payer: Self-pay

## 2021-03-28 VITALS — BP 99/68 | HR 68 | Temp 96.8°F | Resp 17 | Wt 182.2 lb

## 2021-03-28 DIAGNOSIS — C7A019 Malignant carcinoid tumor of the small intestine, unspecified portion: Secondary | ICD-10-CM

## 2021-03-28 DIAGNOSIS — C7A00 Malignant carcinoid tumor of unspecified site: Secondary | ICD-10-CM | POA: Diagnosis not present

## 2021-03-28 DIAGNOSIS — C179 Malignant neoplasm of small intestine, unspecified: Secondary | ICD-10-CM

## 2021-03-28 DIAGNOSIS — C7B02 Secondary carcinoid tumors of liver: Secondary | ICD-10-CM | POA: Insufficient documentation

## 2021-03-28 LAB — CBC WITH DIFFERENTIAL/PLATELET
Abs Immature Granulocytes: 0.01 10*3/uL (ref 0.00–0.07)
Basophils Absolute: 0 10*3/uL (ref 0.0–0.1)
Basophils Relative: 1 %
Eosinophils Absolute: 0 10*3/uL (ref 0.0–0.5)
Eosinophils Relative: 0 %
HCT: 33 % — ABNORMAL LOW (ref 39.0–52.0)
Hemoglobin: 10.5 g/dL — ABNORMAL LOW (ref 13.0–17.0)
Immature Granulocytes: 0 %
Lymphocytes Relative: 13 %
Lymphs Abs: 0.6 10*3/uL — ABNORMAL LOW (ref 0.7–4.0)
MCH: 27.7 pg (ref 26.0–34.0)
MCHC: 31.8 g/dL (ref 30.0–36.0)
MCV: 87.1 fL (ref 80.0–100.0)
Monocytes Absolute: 0.6 10*3/uL (ref 0.1–1.0)
Monocytes Relative: 13 %
Neutro Abs: 3.2 10*3/uL (ref 1.7–7.7)
Neutrophils Relative %: 73 %
Platelets: 137 10*3/uL — ABNORMAL LOW (ref 150–400)
RBC: 3.79 MIL/uL — ABNORMAL LOW (ref 4.22–5.81)
RDW: 15.4 % (ref 11.5–15.5)
WBC: 4.4 10*3/uL (ref 4.0–10.5)
nRBC: 0 % (ref 0.0–0.2)

## 2021-03-28 LAB — COMPREHENSIVE METABOLIC PANEL
ALT: 24 U/L (ref 0–44)
AST: 39 U/L (ref 15–41)
Albumin: 3.7 g/dL (ref 3.5–5.0)
Alkaline Phosphatase: 82 U/L (ref 38–126)
Anion gap: 11 (ref 5–15)
BUN: 49 mg/dL — ABNORMAL HIGH (ref 8–23)
CO2: 27 mmol/L (ref 22–32)
Calcium: 8.7 mg/dL — ABNORMAL LOW (ref 8.9–10.3)
Chloride: 99 mmol/L (ref 98–111)
Creatinine, Ser: 7.82 mg/dL — ABNORMAL HIGH (ref 0.61–1.24)
GFR, Estimated: 6 mL/min — ABNORMAL LOW (ref >60)
Glucose, Bld: 100 mg/dL — ABNORMAL HIGH (ref 70–99)
Potassium: 4.3 mmol/L (ref 3.5–5.1)
Sodium: 137 mmol/L (ref 135–145)
Total Bilirubin: 0.8 mg/dL (ref 0.3–1.2)
Total Protein: 6.2 g/dL — ABNORMAL LOW (ref 6.5–8.1)

## 2021-03-28 LAB — LACTATE DEHYDROGENASE: LDH: 185 U/L (ref 98–192)

## 2021-03-28 MED ORDER — OCTREOTIDE ACETATE 30 MG IM KIT
30.0000 mg | PACK | Freq: Once | INTRAMUSCULAR | Status: AC
  Administered 2021-03-28: 30 mg via INTRAMUSCULAR

## 2021-03-28 NOTE — Patient Instructions (Signed)
[facility]  Discharge Instructions: Thank you for choosing [facility] to provide your oncology and hematology care.  If you have a lab appointment with the Cancer Center, please come in thru the Main Entrance and check in at the main information desk.  Wear comfortable clothing and clothing appropriate for easy access to any Portacath or PICC line.   We strive to give you quality time with your provider. You may need to reschedule your appointment if you arrive late (15 or more minutes).  Arriving late affects you and other patients whose appointments are after yours.  Also, if you miss three or more appointments without notifying the office, you may be dismissed from the clinic at the provider's discretion.      For prescription refill requests, have your pharmacy contact our office and allow 72 hours for refills to be completed.        To help prevent nausea and vomiting after your treatment, we encourage you to take your nausea medication as directed.  BELOW ARE SYMPTOMS THAT SHOULD BE REPORTED IMMEDIATELY: *FEVER GREATER THAN 100.4 F (38 C) OR HIGHER *CHILLS OR SWEATING *NAUSEA AND VOMITING THAT IS NOT CONTROLLED WITH YOUR NAUSEA MEDICATION *UNUSUAL SHORTNESS OF BREATH *UNUSUAL BRUISING OR BLEEDING *URINARY PROBLEMS (pain or burning when urinating, or frequent urination) *BOWEL PROBLEMS (unusual diarrhea, constipation, pain near the anus) TENDERNESS IN MOUTH AND THROAT WITH OR WITHOUT PRESENCE OF ULCERS (sore throat, sores in mouth, or a toothache) UNUSUAL RASH, SWELLING OR PAIN  UNUSUAL VAGINAL DISCHARGE OR ITCHING   Items with * indicate a potential emergency and should be followed up as soon as possible or go to the Emergency Department if any problems should occur.  Please show the CHEMOTHERAPY ALERT CARD or IMMUNOTHERAPY ALERT CARD at check-in to the Emergency Department and triage nurse.  Should you have questions after your visit or need to cancel  or reschedule your appointment, please contact [facility] [phone]  and follow the prompts.  Office hours are 8:00 a.m. to 4:30 p.m. Monday - Friday. Please note that voicemails left after 4:00 p.m. may not be returned until the following business day.  We are closed weekends and major holidays. You have access to a nurse at all times for urgent questions. Please call the main number to the clinic [phone] and follow the prompts.  For any non-urgent questions, you may also contact your provider using MyChart. We now offer e-Visits for anyone 18 and older to request care online for non-urgent symptoms. For details visit [email or web address].   Also download the MyChart app! Go to the app store, search "MyChart", open the app, select [facility], and log in with your MyChart username and password.  Due to Covid, a mask is required upon entering the hospital/clinic. If you do not have a mask, one will be given to you upon arrival. For doctor visits, patients may have 1 support person aged 18 or older with them. For treatment visits, patients cannot have anyone with them due to current Covid guidelines and our immunocompromised population.  

## 2021-03-28 NOTE — Progress Notes (Signed)
Patient presents today for Sandostatin 30 mg injection.  Patient is in stable condition with no complaints voiced.  Labs were reviewed by Dr. Delton Coombes during his office visit.  Vital signs are all stable.   Patient tolerated Sandostatin injection with no complaints voiced.  Site clean and dry with no bruising or swelling noted.  No complaints of pain.  Discharged with vital signs stable and no signs or symptoms of distress noted.

## 2021-03-28 NOTE — Patient Instructions (Addendum)
[facility] Discharge Instructions  You were seen today by Dr. Katragadda. He went over your recent results, and you received your injection. Dr. Katragadda will see you back in 2 months for labs and follow up.   Thank you for choosing [facility] at [facility] to provide your oncology and hematology care.  To afford each patient quality time with our provider, please arrive at least 15 minutes before your scheduled appointment time.   If you have a lab appointment with the Cancer Center please come in thru the Main Entrance and check in at the main information desk  You need to re-schedule your appointment should you arrive 10 or more minutes late.  We strive to give you quality time with our providers, and arriving late affects you and other patients whose appointments are after yours.  Also, if you no show three or more times for appointments you may be dismissed from the clinic at the providers discretion.     Again, thank you for choosing [facility].  Our hope is that these requests will decrease the amount of time that you wait before being seen by our physicians.       _____________________________________________________________  Should you have questions after your visit to [facility], please contact our office at [phone] between the hours of 8:00 a.m. and 4:30 p.m.  Voicemails left after 4:00 p.m. will not be returned until the following business day.  For prescription refill requests, have your pharmacy contact our office and allow 72 hours.    Cancer Center Support Programs:   > Cancer Support Group  2nd Tuesday of the month 1pm-2pm, Journey Room   

## 2021-03-28 NOTE — Progress Notes (Signed)
Patient has been assessed, vital signs and labs have been reviewed by Dr. Delton Coombes. ANC, Creatinine, LFTs, and Platelets are within treatment parameters per Dr. Delton Coombes. The patient is good to proceed with injections at this time.  Primary RN and pharmacy aware.

## 2021-03-29 DIAGNOSIS — D631 Anemia in chronic kidney disease: Secondary | ICD-10-CM | POA: Diagnosis not present

## 2021-03-29 DIAGNOSIS — Z992 Dependence on renal dialysis: Secondary | ICD-10-CM | POA: Diagnosis not present

## 2021-03-29 DIAGNOSIS — D509 Iron deficiency anemia, unspecified: Secondary | ICD-10-CM | POA: Diagnosis not present

## 2021-03-29 DIAGNOSIS — N186 End stage renal disease: Secondary | ICD-10-CM | POA: Diagnosis not present

## 2021-03-29 LAB — CHROMOGRANIN A: Chromogranin A (ng/mL): 729.7 ng/mL — ABNORMAL HIGH (ref 0.0–101.8)

## 2021-03-31 DIAGNOSIS — D631 Anemia in chronic kidney disease: Secondary | ICD-10-CM | POA: Diagnosis not present

## 2021-03-31 DIAGNOSIS — Z992 Dependence on renal dialysis: Secondary | ICD-10-CM | POA: Diagnosis not present

## 2021-03-31 DIAGNOSIS — D509 Iron deficiency anemia, unspecified: Secondary | ICD-10-CM | POA: Diagnosis not present

## 2021-03-31 DIAGNOSIS — N186 End stage renal disease: Secondary | ICD-10-CM | POA: Diagnosis not present

## 2021-04-02 DIAGNOSIS — Z992 Dependence on renal dialysis: Secondary | ICD-10-CM | POA: Diagnosis not present

## 2021-04-02 DIAGNOSIS — N186 End stage renal disease: Secondary | ICD-10-CM | POA: Diagnosis not present

## 2021-04-02 DIAGNOSIS — D509 Iron deficiency anemia, unspecified: Secondary | ICD-10-CM | POA: Diagnosis not present

## 2021-04-02 DIAGNOSIS — D631 Anemia in chronic kidney disease: Secondary | ICD-10-CM | POA: Diagnosis not present

## 2021-04-05 DIAGNOSIS — N186 End stage renal disease: Secondary | ICD-10-CM | POA: Diagnosis not present

## 2021-04-05 DIAGNOSIS — D509 Iron deficiency anemia, unspecified: Secondary | ICD-10-CM | POA: Diagnosis not present

## 2021-04-05 DIAGNOSIS — Z992 Dependence on renal dialysis: Secondary | ICD-10-CM | POA: Diagnosis not present

## 2021-04-05 DIAGNOSIS — D631 Anemia in chronic kidney disease: Secondary | ICD-10-CM | POA: Diagnosis not present

## 2021-04-07 DIAGNOSIS — Z992 Dependence on renal dialysis: Secondary | ICD-10-CM | POA: Diagnosis not present

## 2021-04-07 DIAGNOSIS — N186 End stage renal disease: Secondary | ICD-10-CM | POA: Diagnosis not present

## 2021-04-07 DIAGNOSIS — D509 Iron deficiency anemia, unspecified: Secondary | ICD-10-CM | POA: Diagnosis not present

## 2021-04-07 DIAGNOSIS — D631 Anemia in chronic kidney disease: Secondary | ICD-10-CM | POA: Diagnosis not present

## 2021-04-09 DIAGNOSIS — Z992 Dependence on renal dialysis: Secondary | ICD-10-CM | POA: Diagnosis not present

## 2021-04-09 DIAGNOSIS — D631 Anemia in chronic kidney disease: Secondary | ICD-10-CM | POA: Diagnosis not present

## 2021-04-09 DIAGNOSIS — N186 End stage renal disease: Secondary | ICD-10-CM | POA: Diagnosis not present

## 2021-04-09 DIAGNOSIS — D509 Iron deficiency anemia, unspecified: Secondary | ICD-10-CM | POA: Diagnosis not present

## 2021-04-12 DIAGNOSIS — D631 Anemia in chronic kidney disease: Secondary | ICD-10-CM | POA: Diagnosis not present

## 2021-04-12 DIAGNOSIS — N186 End stage renal disease: Secondary | ICD-10-CM | POA: Diagnosis not present

## 2021-04-12 DIAGNOSIS — D509 Iron deficiency anemia, unspecified: Secondary | ICD-10-CM | POA: Diagnosis not present

## 2021-04-12 DIAGNOSIS — Z992 Dependence on renal dialysis: Secondary | ICD-10-CM | POA: Diagnosis not present

## 2021-04-14 DIAGNOSIS — N186 End stage renal disease: Secondary | ICD-10-CM | POA: Diagnosis not present

## 2021-04-14 DIAGNOSIS — D509 Iron deficiency anemia, unspecified: Secondary | ICD-10-CM | POA: Diagnosis not present

## 2021-04-14 DIAGNOSIS — Z992 Dependence on renal dialysis: Secondary | ICD-10-CM | POA: Diagnosis not present

## 2021-04-14 DIAGNOSIS — D631 Anemia in chronic kidney disease: Secondary | ICD-10-CM | POA: Diagnosis not present

## 2021-04-15 DIAGNOSIS — N186 End stage renal disease: Secondary | ICD-10-CM | POA: Diagnosis not present

## 2021-04-15 DIAGNOSIS — Z992 Dependence on renal dialysis: Secondary | ICD-10-CM | POA: Diagnosis not present

## 2021-04-16 DIAGNOSIS — N186 End stage renal disease: Secondary | ICD-10-CM | POA: Diagnosis not present

## 2021-04-16 DIAGNOSIS — D631 Anemia in chronic kidney disease: Secondary | ICD-10-CM | POA: Diagnosis not present

## 2021-04-16 DIAGNOSIS — Z992 Dependence on renal dialysis: Secondary | ICD-10-CM | POA: Diagnosis not present

## 2021-04-16 DIAGNOSIS — D509 Iron deficiency anemia, unspecified: Secondary | ICD-10-CM | POA: Diagnosis not present

## 2021-04-16 DIAGNOSIS — N2581 Secondary hyperparathyroidism of renal origin: Secondary | ICD-10-CM | POA: Diagnosis not present

## 2021-04-16 DIAGNOSIS — Z23 Encounter for immunization: Secondary | ICD-10-CM | POA: Diagnosis not present

## 2021-04-19 DIAGNOSIS — D509 Iron deficiency anemia, unspecified: Secondary | ICD-10-CM | POA: Diagnosis not present

## 2021-04-19 DIAGNOSIS — Z992 Dependence on renal dialysis: Secondary | ICD-10-CM | POA: Diagnosis not present

## 2021-04-19 DIAGNOSIS — D631 Anemia in chronic kidney disease: Secondary | ICD-10-CM | POA: Diagnosis not present

## 2021-04-19 DIAGNOSIS — N2581 Secondary hyperparathyroidism of renal origin: Secondary | ICD-10-CM | POA: Diagnosis not present

## 2021-04-19 DIAGNOSIS — N186 End stage renal disease: Secondary | ICD-10-CM | POA: Diagnosis not present

## 2021-04-19 DIAGNOSIS — Z23 Encounter for immunization: Secondary | ICD-10-CM | POA: Diagnosis not present

## 2021-04-21 DIAGNOSIS — N2581 Secondary hyperparathyroidism of renal origin: Secondary | ICD-10-CM | POA: Diagnosis not present

## 2021-04-21 DIAGNOSIS — N186 End stage renal disease: Secondary | ICD-10-CM | POA: Diagnosis not present

## 2021-04-21 DIAGNOSIS — D631 Anemia in chronic kidney disease: Secondary | ICD-10-CM | POA: Diagnosis not present

## 2021-04-21 DIAGNOSIS — D509 Iron deficiency anemia, unspecified: Secondary | ICD-10-CM | POA: Diagnosis not present

## 2021-04-21 DIAGNOSIS — Z992 Dependence on renal dialysis: Secondary | ICD-10-CM | POA: Diagnosis not present

## 2021-04-21 DIAGNOSIS — Z23 Encounter for immunization: Secondary | ICD-10-CM | POA: Diagnosis not present

## 2021-04-23 DIAGNOSIS — N2581 Secondary hyperparathyroidism of renal origin: Secondary | ICD-10-CM | POA: Diagnosis not present

## 2021-04-23 DIAGNOSIS — Z23 Encounter for immunization: Secondary | ICD-10-CM | POA: Diagnosis not present

## 2021-04-23 DIAGNOSIS — D631 Anemia in chronic kidney disease: Secondary | ICD-10-CM | POA: Diagnosis not present

## 2021-04-23 DIAGNOSIS — N186 End stage renal disease: Secondary | ICD-10-CM | POA: Diagnosis not present

## 2021-04-23 DIAGNOSIS — Z992 Dependence on renal dialysis: Secondary | ICD-10-CM | POA: Diagnosis not present

## 2021-04-23 DIAGNOSIS — D509 Iron deficiency anemia, unspecified: Secondary | ICD-10-CM | POA: Diagnosis not present

## 2021-04-25 ENCOUNTER — Inpatient Hospital Stay (HOSPITAL_COMMUNITY): Payer: Medicare Other | Attending: Hematology

## 2021-04-25 ENCOUNTER — Other Ambulatory Visit: Payer: Self-pay

## 2021-04-25 ENCOUNTER — Encounter (HOSPITAL_COMMUNITY): Payer: Self-pay

## 2021-04-25 ENCOUNTER — Inpatient Hospital Stay (HOSPITAL_COMMUNITY): Payer: Medicare Other

## 2021-04-25 VITALS — BP 96/62 | HR 72 | Temp 96.7°F | Resp 18

## 2021-04-25 DIAGNOSIS — C7A00 Malignant carcinoid tumor of unspecified site: Secondary | ICD-10-CM | POA: Insufficient documentation

## 2021-04-25 DIAGNOSIS — C7B02 Secondary carcinoid tumors of liver: Secondary | ICD-10-CM | POA: Insufficient documentation

## 2021-04-25 LAB — CBC WITH DIFFERENTIAL/PLATELET
Abs Immature Granulocytes: 0.01 10*3/uL (ref 0.00–0.07)
Basophils Absolute: 0 10*3/uL (ref 0.0–0.1)
Basophils Relative: 1 %
Eosinophils Absolute: 0.1 10*3/uL (ref 0.0–0.5)
Eosinophils Relative: 2 %
HCT: 29.1 % — ABNORMAL LOW (ref 39.0–52.0)
Hemoglobin: 9.2 g/dL — ABNORMAL LOW (ref 13.0–17.0)
Immature Granulocytes: 0 %
Lymphocytes Relative: 9 %
Lymphs Abs: 0.3 10*3/uL — ABNORMAL LOW (ref 0.7–4.0)
MCH: 28 pg (ref 26.0–34.0)
MCHC: 31.6 g/dL (ref 30.0–36.0)
MCV: 88.7 fL (ref 80.0–100.0)
Monocytes Absolute: 0.4 10*3/uL (ref 0.1–1.0)
Monocytes Relative: 10 %
Neutro Abs: 2.7 10*3/uL (ref 1.7–7.7)
Neutrophils Relative %: 78 %
Platelets: 128 10*3/uL — ABNORMAL LOW (ref 150–400)
RBC: 3.28 MIL/uL — ABNORMAL LOW (ref 4.22–5.81)
RDW: 14.4 % (ref 11.5–15.5)
WBC: 3.5 10*3/uL — ABNORMAL LOW (ref 4.0–10.5)
nRBC: 0 % (ref 0.0–0.2)

## 2021-04-25 LAB — COMPREHENSIVE METABOLIC PANEL
ALT: 18 U/L (ref 0–44)
AST: 30 U/L (ref 15–41)
Albumin: 3.4 g/dL — ABNORMAL LOW (ref 3.5–5.0)
Alkaline Phosphatase: 80 U/L (ref 38–126)
Anion gap: 8 (ref 5–15)
BUN: 52 mg/dL — ABNORMAL HIGH (ref 8–23)
CO2: 29 mmol/L (ref 22–32)
Calcium: 8.2 mg/dL — ABNORMAL LOW (ref 8.9–10.3)
Chloride: 99 mmol/L (ref 98–111)
Creatinine, Ser: 8.04 mg/dL — ABNORMAL HIGH (ref 0.61–1.24)
GFR, Estimated: 6 mL/min — ABNORMAL LOW (ref >60)
Glucose, Bld: 107 mg/dL — ABNORMAL HIGH (ref 70–99)
Potassium: 3.3 mmol/L — ABNORMAL LOW (ref 3.5–5.1)
Sodium: 136 mmol/L (ref 135–145)
Total Bilirubin: 0.8 mg/dL (ref 0.3–1.2)
Total Protein: 6 g/dL — ABNORMAL LOW (ref 6.5–8.1)

## 2021-04-25 LAB — LACTATE DEHYDROGENASE: LDH: 154 U/L (ref 98–192)

## 2021-04-25 MED ORDER — OCTREOTIDE ACETATE 30 MG IM KIT
30.0000 mg | PACK | Freq: Once | INTRAMUSCULAR | Status: AC
  Administered 2021-04-25: 30 mg via INTRAMUSCULAR
  Filled 2021-04-25: qty 1

## 2021-04-25 NOTE — Patient Instructions (Signed)
[facility]  Discharge Instructions: Thank you for choosing [facility] to provide your oncology and hematology care.  If you have a lab appointment with the Cancer Center, please come in thru the Main Entrance and check in at the main information desk.  Wear comfortable clothing and clothing appropriate for easy access to any Portacath or PICC line.   We strive to give you quality time with your provider. You may need to reschedule your appointment if you arrive late (15 or more minutes).  Arriving late affects you and other patients whose appointments are after yours.  Also, if you miss three or more appointments without notifying the office, you may be dismissed from the clinic at the provider's discretion.      For prescription refill requests, have your pharmacy contact our office and allow 72 hours for refills to be completed.        To help prevent nausea and vomiting after your treatment, we encourage you to take your nausea medication as directed.  BELOW ARE SYMPTOMS THAT SHOULD BE REPORTED IMMEDIATELY: *FEVER GREATER THAN 100.4 F (38 C) OR HIGHER *CHILLS OR SWEATING *NAUSEA AND VOMITING THAT IS NOT CONTROLLED WITH YOUR NAUSEA MEDICATION *UNUSUAL SHORTNESS OF BREATH *UNUSUAL BRUISING OR BLEEDING *URINARY PROBLEMS (pain or burning when urinating, or frequent urination) *BOWEL PROBLEMS (unusual diarrhea, constipation, pain near the anus) TENDERNESS IN MOUTH AND THROAT WITH OR WITHOUT PRESENCE OF ULCERS (sore throat, sores in mouth, or a toothache) UNUSUAL RASH, SWELLING OR PAIN  UNUSUAL VAGINAL DISCHARGE OR ITCHING   Items with * indicate a potential emergency and should be followed up as soon as possible or go to the Emergency Department if any problems should occur.  Please show the CHEMOTHERAPY ALERT CARD or IMMUNOTHERAPY ALERT CARD at check-in to the Emergency Department and triage nurse.  Should you have questions after your visit or need to cancel  or reschedule your appointment, please contact [facility] [phone]  and follow the prompts.  Office hours are 8:00 a.m. to 4:30 p.m. Monday - Friday. Please note that voicemails left after 4:00 p.m. may not be returned until the following business day.  We are closed weekends and major holidays. You have access to a nurse at all times for urgent questions. Please call the main number to the clinic [phone] and follow the prompts.  For any non-urgent questions, you may also contact your provider using MyChart. We now offer e-Visits for anyone 18 and older to request care online for non-urgent symptoms. For details visit [email or web address].   Also download the MyChart app! Go to the app store, search "MyChart", open the app, select [facility], and log in with your MyChart username and password.  Due to Covid, a mask is required upon entering the hospital/clinic. If you do not have a mask, one will be given to you upon arrival. For doctor visits, patients may have 1 support person aged 18 or older with them. For treatment visits, patients cannot have anyone with them due to current Covid guidelines and our immunocompromised population.  

## 2021-04-25 NOTE — Progress Notes (Signed)
Patient tolerated Sandostatin injection with no complaints voiced.  Patient stable during and after injection.  See MAR for details.  Site clean and dry with no bruising or swelling noted at site.  Band aid applied.  Vss with discharge and left in satisfactory condition with no s/s of distress noted.  

## 2021-04-26 DIAGNOSIS — Z23 Encounter for immunization: Secondary | ICD-10-CM | POA: Diagnosis not present

## 2021-04-26 DIAGNOSIS — Z992 Dependence on renal dialysis: Secondary | ICD-10-CM | POA: Diagnosis not present

## 2021-04-26 DIAGNOSIS — D509 Iron deficiency anemia, unspecified: Secondary | ICD-10-CM | POA: Diagnosis not present

## 2021-04-26 DIAGNOSIS — N186 End stage renal disease: Secondary | ICD-10-CM | POA: Diagnosis not present

## 2021-04-26 DIAGNOSIS — D631 Anemia in chronic kidney disease: Secondary | ICD-10-CM | POA: Diagnosis not present

## 2021-04-26 DIAGNOSIS — N2581 Secondary hyperparathyroidism of renal origin: Secondary | ICD-10-CM | POA: Diagnosis not present

## 2021-04-27 LAB — CHROMOGRANIN A: Chromogranin A (ng/mL): 686.9 ng/mL — ABNORMAL HIGH (ref 0.0–101.8)

## 2021-04-28 DIAGNOSIS — D509 Iron deficiency anemia, unspecified: Secondary | ICD-10-CM | POA: Diagnosis not present

## 2021-04-28 DIAGNOSIS — N186 End stage renal disease: Secondary | ICD-10-CM | POA: Diagnosis not present

## 2021-04-28 DIAGNOSIS — D631 Anemia in chronic kidney disease: Secondary | ICD-10-CM | POA: Diagnosis not present

## 2021-04-28 DIAGNOSIS — N2581 Secondary hyperparathyroidism of renal origin: Secondary | ICD-10-CM | POA: Diagnosis not present

## 2021-04-28 DIAGNOSIS — Z23 Encounter for immunization: Secondary | ICD-10-CM | POA: Diagnosis not present

## 2021-04-28 DIAGNOSIS — Z992 Dependence on renal dialysis: Secondary | ICD-10-CM | POA: Diagnosis not present

## 2021-04-30 DIAGNOSIS — D631 Anemia in chronic kidney disease: Secondary | ICD-10-CM | POA: Diagnosis not present

## 2021-04-30 DIAGNOSIS — Z992 Dependence on renal dialysis: Secondary | ICD-10-CM | POA: Diagnosis not present

## 2021-04-30 DIAGNOSIS — D509 Iron deficiency anemia, unspecified: Secondary | ICD-10-CM | POA: Diagnosis not present

## 2021-04-30 DIAGNOSIS — N2581 Secondary hyperparathyroidism of renal origin: Secondary | ICD-10-CM | POA: Diagnosis not present

## 2021-04-30 DIAGNOSIS — Z23 Encounter for immunization: Secondary | ICD-10-CM | POA: Diagnosis not present

## 2021-04-30 DIAGNOSIS — N186 End stage renal disease: Secondary | ICD-10-CM | POA: Diagnosis not present

## 2021-05-03 DIAGNOSIS — Z992 Dependence on renal dialysis: Secondary | ICD-10-CM | POA: Diagnosis not present

## 2021-05-03 DIAGNOSIS — D509 Iron deficiency anemia, unspecified: Secondary | ICD-10-CM | POA: Diagnosis not present

## 2021-05-03 DIAGNOSIS — N186 End stage renal disease: Secondary | ICD-10-CM | POA: Diagnosis not present

## 2021-05-03 DIAGNOSIS — D631 Anemia in chronic kidney disease: Secondary | ICD-10-CM | POA: Diagnosis not present

## 2021-05-03 DIAGNOSIS — N2581 Secondary hyperparathyroidism of renal origin: Secondary | ICD-10-CM | POA: Diagnosis not present

## 2021-05-03 DIAGNOSIS — Z23 Encounter for immunization: Secondary | ICD-10-CM | POA: Diagnosis not present

## 2021-05-05 DIAGNOSIS — Z992 Dependence on renal dialysis: Secondary | ICD-10-CM | POA: Diagnosis not present

## 2021-05-05 DIAGNOSIS — D631 Anemia in chronic kidney disease: Secondary | ICD-10-CM | POA: Diagnosis not present

## 2021-05-05 DIAGNOSIS — N186 End stage renal disease: Secondary | ICD-10-CM | POA: Diagnosis not present

## 2021-05-05 DIAGNOSIS — Z23 Encounter for immunization: Secondary | ICD-10-CM | POA: Diagnosis not present

## 2021-05-05 DIAGNOSIS — D509 Iron deficiency anemia, unspecified: Secondary | ICD-10-CM | POA: Diagnosis not present

## 2021-05-05 DIAGNOSIS — N2581 Secondary hyperparathyroidism of renal origin: Secondary | ICD-10-CM | POA: Diagnosis not present

## 2021-05-07 DIAGNOSIS — D509 Iron deficiency anemia, unspecified: Secondary | ICD-10-CM | POA: Diagnosis not present

## 2021-05-07 DIAGNOSIS — D631 Anemia in chronic kidney disease: Secondary | ICD-10-CM | POA: Diagnosis not present

## 2021-05-07 DIAGNOSIS — N2581 Secondary hyperparathyroidism of renal origin: Secondary | ICD-10-CM | POA: Diagnosis not present

## 2021-05-07 DIAGNOSIS — Z992 Dependence on renal dialysis: Secondary | ICD-10-CM | POA: Diagnosis not present

## 2021-05-07 DIAGNOSIS — N186 End stage renal disease: Secondary | ICD-10-CM | POA: Diagnosis not present

## 2021-05-07 DIAGNOSIS — Z23 Encounter for immunization: Secondary | ICD-10-CM | POA: Diagnosis not present

## 2021-05-10 DIAGNOSIS — N2581 Secondary hyperparathyroidism of renal origin: Secondary | ICD-10-CM | POA: Diagnosis not present

## 2021-05-10 DIAGNOSIS — D509 Iron deficiency anemia, unspecified: Secondary | ICD-10-CM | POA: Diagnosis not present

## 2021-05-10 DIAGNOSIS — Z992 Dependence on renal dialysis: Secondary | ICD-10-CM | POA: Diagnosis not present

## 2021-05-10 DIAGNOSIS — D631 Anemia in chronic kidney disease: Secondary | ICD-10-CM | POA: Diagnosis not present

## 2021-05-10 DIAGNOSIS — N186 End stage renal disease: Secondary | ICD-10-CM | POA: Diagnosis not present

## 2021-05-10 DIAGNOSIS — Z23 Encounter for immunization: Secondary | ICD-10-CM | POA: Diagnosis not present

## 2021-05-12 DIAGNOSIS — D631 Anemia in chronic kidney disease: Secondary | ICD-10-CM | POA: Diagnosis not present

## 2021-05-12 DIAGNOSIS — N2581 Secondary hyperparathyroidism of renal origin: Secondary | ICD-10-CM | POA: Diagnosis not present

## 2021-05-12 DIAGNOSIS — N186 End stage renal disease: Secondary | ICD-10-CM | POA: Diagnosis not present

## 2021-05-12 DIAGNOSIS — Z23 Encounter for immunization: Secondary | ICD-10-CM | POA: Diagnosis not present

## 2021-05-12 DIAGNOSIS — Z992 Dependence on renal dialysis: Secondary | ICD-10-CM | POA: Diagnosis not present

## 2021-05-12 DIAGNOSIS — D509 Iron deficiency anemia, unspecified: Secondary | ICD-10-CM | POA: Diagnosis not present

## 2021-05-14 DIAGNOSIS — Z992 Dependence on renal dialysis: Secondary | ICD-10-CM | POA: Diagnosis not present

## 2021-05-14 DIAGNOSIS — N2581 Secondary hyperparathyroidism of renal origin: Secondary | ICD-10-CM | POA: Diagnosis not present

## 2021-05-14 DIAGNOSIS — N186 End stage renal disease: Secondary | ICD-10-CM | POA: Diagnosis not present

## 2021-05-14 DIAGNOSIS — Z23 Encounter for immunization: Secondary | ICD-10-CM | POA: Diagnosis not present

## 2021-05-14 DIAGNOSIS — D631 Anemia in chronic kidney disease: Secondary | ICD-10-CM | POA: Diagnosis not present

## 2021-05-14 DIAGNOSIS — D509 Iron deficiency anemia, unspecified: Secondary | ICD-10-CM | POA: Diagnosis not present

## 2021-05-16 DIAGNOSIS — N186 End stage renal disease: Secondary | ICD-10-CM | POA: Diagnosis not present

## 2021-05-16 DIAGNOSIS — Z992 Dependence on renal dialysis: Secondary | ICD-10-CM | POA: Diagnosis not present

## 2021-05-17 DIAGNOSIS — D631 Anemia in chronic kidney disease: Secondary | ICD-10-CM | POA: Diagnosis not present

## 2021-05-17 DIAGNOSIS — N2581 Secondary hyperparathyroidism of renal origin: Secondary | ICD-10-CM | POA: Diagnosis not present

## 2021-05-17 DIAGNOSIS — Z992 Dependence on renal dialysis: Secondary | ICD-10-CM | POA: Diagnosis not present

## 2021-05-17 DIAGNOSIS — N186 End stage renal disease: Secondary | ICD-10-CM | POA: Diagnosis not present

## 2021-05-17 DIAGNOSIS — D509 Iron deficiency anemia, unspecified: Secondary | ICD-10-CM | POA: Diagnosis not present

## 2021-05-19 DIAGNOSIS — D631 Anemia in chronic kidney disease: Secondary | ICD-10-CM | POA: Diagnosis not present

## 2021-05-19 DIAGNOSIS — Z992 Dependence on renal dialysis: Secondary | ICD-10-CM | POA: Diagnosis not present

## 2021-05-19 DIAGNOSIS — D509 Iron deficiency anemia, unspecified: Secondary | ICD-10-CM | POA: Diagnosis not present

## 2021-05-19 DIAGNOSIS — N2581 Secondary hyperparathyroidism of renal origin: Secondary | ICD-10-CM | POA: Diagnosis not present

## 2021-05-19 DIAGNOSIS — N186 End stage renal disease: Secondary | ICD-10-CM | POA: Diagnosis not present

## 2021-05-20 ENCOUNTER — Other Ambulatory Visit (HOSPITAL_COMMUNITY): Payer: Self-pay

## 2021-05-20 ENCOUNTER — Encounter (HOSPITAL_COMMUNITY): Payer: Self-pay | Admitting: Hematology

## 2021-05-21 DIAGNOSIS — D509 Iron deficiency anemia, unspecified: Secondary | ICD-10-CM | POA: Diagnosis not present

## 2021-05-21 DIAGNOSIS — N2581 Secondary hyperparathyroidism of renal origin: Secondary | ICD-10-CM | POA: Diagnosis not present

## 2021-05-21 DIAGNOSIS — N186 End stage renal disease: Secondary | ICD-10-CM | POA: Diagnosis not present

## 2021-05-21 DIAGNOSIS — D631 Anemia in chronic kidney disease: Secondary | ICD-10-CM | POA: Diagnosis not present

## 2021-05-21 DIAGNOSIS — Z992 Dependence on renal dialysis: Secondary | ICD-10-CM | POA: Diagnosis not present

## 2021-05-21 NOTE — Progress Notes (Signed)
[place] [place], [place] [address]   CLINIC:  Medical Oncology/Hematology  PCP:  [name], MD [place] / [place] [id number] [phone]   REASON FOR VISIT:  Follow-up for metastatic carcinoid tumor of small intestine  PRIOR THERAPY: none  NGS Results: not done  CURRENT THERAPY: Everolimus daily & Sandostatin monthly  BRIEF ONCOLOGIC HISTORY:  Oncology History   No history exists.    CANCER STAGING: Cancer Staging No matching staging information was found for the patient.  INTERVAL HISTORY:  Allen Davenport, a 80 y.o. male, returns for routine follow-up of his metastatic carcinoid tumor of small intestine. Allen Davenport was last seen on 03/28/2021.   Today he reports feeling good. He denies diarrhea, mouth sores, flushing, wheezing, and new pains. He reports urinating 2-3 times daily. He reports swelling in his left ankle and denies swelling in his right leg.   REVIEW OF SYSTEMS:  Review of Systems  Constitutional:  Negative for appetite change (75%) and fatigue (75%).  HENT:   Negative for mouth sores.   Respiratory:  Negative for wheezing.   Cardiovascular:  Positive for leg swelling (L ankle).  Gastrointestinal:  Negative for diarrhea.  Musculoskeletal:  Negative for arthralgias and myalgias.  All other systems reviewed and are negative.  PAST MEDICAL/SURGICAL HISTORY:  Past Medical History:  Diagnosis Date   Anemia    Cancer ([facility])    right renal . Prostate- Radiation treatment   CHF (congestive heart failure) (HCC)    Chronic kidney disease    Dialysis T/TH/Sa   Edema    Heart murmur    "nothing to worry about"   Hyperlipidemia    Hypertension    Malignant carcinoid tumor ([facility]) 01/03/2016   Pneumonia    as a child   Past Surgical History:  Procedure Laterality Date   AV FISTULA PLACEMENT Left 10/20/2014   Procedure: Left Arm ARTERIOVENOUS (AV) FISTULA CREATION;  Surgeon: Elam Dutch, MD;  Location:  [facility];  Service: Vascular;  Laterality: Left;   NEPHRECTOMY Right 2010   PERIPHERAL VASCULAR BALLOON ANGIOPLASTY  06/20/2019   Procedure: PERIPHERAL VASCULAR BALLOON ANGIOPLASTY;  Surgeon: Elam Dutch, MD;  Location: [facility];  Service: Cardiovascular;;   REVISON OF ARTERIOVENOUS FISTULA Left 08/04/2019   Procedure: REVISON OF ARTERIOVENOUS FISTULA WITH SIDE BRANCH LIGATION;  Surgeon: Elam Dutch, MD;  Location: [facility];  Service: Vascular;  Laterality: Left;    SOCIAL HISTORY:  Social History   Socioeconomic History   Marital status: Married    Spouse name: Not on file   Number of children: Not on file   Years of education: Not on file   Highest education level: Not on file  Occupational History   Not on file  Tobacco Use   Smoking status: Never   Smokeless tobacco: Never  Vaping Use   Vaping Use: Never used  Substance and Sexual Activity   Alcohol use: No    Alcohol/week: 0.0 standard drinks   Drug use: No   Sexual activity: Not on file  Other Topics Concern   Not on file  Social History Narrative   Not on file   Social Determinants of Health   Financial Resource Strain: Low Risk    Difficulty of Paying Living Expenses: Not hard at all  Food Insecurity: No Food Insecurity   Worried About [place] in the Last Year: Never true   Ran Out  of Food in the Last Year: Never true  Transportation Needs: No Transportation Needs   Lack of Transportation (Medical): No   Lack of Transportation (Non-Medical): No  Physical Activity: Insufficiently Active   Days of Exercise per Week: 7 days   Minutes of Exercise per Session: 10 min  Stress: No Stress Concern Present   Feeling of Stress : Not at all  Social Connections: Moderately Integrated   Frequency of Communication with Friends and Family: More than three times a week   Frequency of Social Gatherings with Friends and Family: Once a week   Attends Religious Services: More than 4 times per year    Active Member of [employer] or Organizations: No   Attends Music therapist: Never   Marital Status: Married  Human resources officer Violence: Not At Risk   Fear of Current or Ex-Partner: No   Emotionally Abused: No   Physically Abused: No   Sexually Abused: No    FAMILY HISTORY:  Family History  Problem Relation Age of Onset   Cancer Mother    Hypertension Father    Cancer Sister     CURRENT MEDICATIONS:  Current Outpatient Medications  Medication Sig Dispense Refill   atorvastatin (LIPITOR) 80 MG tablet Take 80 mg by mouth daily at 6 PM.      calcitRIOL (ROCALTROL) 0.25 MCG capsule Take 0.25 mcg by mouth daily.      carvedilol (COREG) 3.125 MG tablet Take 3.125 mg by mouth 2 (two) times daily with a meal.      everolimus (AFINITOR) 5 MG tablet Take 1 tablet (5 mg total) by mouth daily. 28 tablet 6   furosemide (LASIX) 40 MG tablet Take 40 mg by mouth every morning.     octreotide (SANDOSTATIN LAR) 30 MG injection Inject 30 mg into the muscle every 28 (twenty-eight) days.      Omega-3 Fatty Acids (FISH OIL) 1000 MG CAPS Take 1,000 mg by mouth daily.      pyridOXINE (B-6) 50 MG tablet Take 1 tablet (50 mg total) by mouth daily. 30 tablet 8   sodium bicarbonate 650 MG tablet Take 650 mg by mouth daily.     No current facility-administered medications for this visit.    ALLERGIES:  No Known Allergies  PHYSICAL EXAM:  Performance status (ECOG): 1 - Symptomatic but completely ambulatory  There were no vitals filed for this visit. Wt Readings from Last 3 Encounters:  03/28/21 182 lb 3.2 oz (82.6 kg)  01/31/21 184 lb 11.2 oz (83.8 kg)  01/03/21 186 lb 12.8 oz (84.7 kg)   Physical Exam Vitals reviewed.  Constitutional:      Appearance: Normal appearance.  Cardiovascular:     Rate and Rhythm: Normal rate and regular rhythm.     Pulses: Normal pulses.     Heart sounds: Normal heart sounds.  Pulmonary:     Effort: Pulmonary effort is normal.     Breath sounds: Normal  breath sounds.  Neurological:     General: No focal deficit present.     Mental Status: He is alert and oriented to person, place, and time.  Psychiatric:        Mood and Affect: Mood normal.        Behavior: Behavior normal.     LABORATORY DATA:  I have reviewed the labs as listed.  CBC Latest Ref Rng & Units 04/25/2021 03/28/2021 01/03/2021  WBC 4.0 - 10.5 K/uL 3.5(L) 4.4 3.8(L)  Hemoglobin 13.0 - 17.0 g/dL 9.2(L) 10.5(L)  13.4  Hematocrit 39.0 - 52.0 % 29.1(L) 33.0(L) 43.4  Platelets 150 - 400 K/uL 128(L) 137(L) 124(L)   CMP Latest Ref Rng & Units 04/25/2021 03/28/2021 01/03/2021  Glucose 70 - 99 mg/dL 107(H) 100(H) 159(H)  BUN 8 - 23 mg/dL 52(H) 49(H) 36(H)  Creatinine 0.61 - 1.24 mg/dL 8.04(H) 7.82(H) 7.21(H)  Sodium 135 - 145 mmol/L 136 137 136  Potassium 3.5 - 5.1 mmol/L 3.3(L) 4.3 3.8  Chloride 98 - 111 mmol/L 99 99 97(L)  CO2 22 - 32 mmol/L 29 27 28   Calcium 8.9 - 10.3 mg/dL 8.2(L) 8.7(L) 8.9  Total Protein 6.5 - 8.1 g/dL 6.0(L) 6.2(L) 6.0(L)  Total Bilirubin 0.3 - 1.2 mg/dL 0.8 0.8 0.6  Alkaline Phos 38 - 126 U/L 80 82 78  AST 15 - 41 U/L 30 39 35  ALT 0 - 44 U/L 18 24 21     DIAGNOSTIC IMAGING:  I have independently reviewed the scans and discussed with the patient. No results found.   ASSESSMENT:  1.  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  Everolimus 5 mg daily started on 05/17/2018. -PET scan on 10/29/2019 showed multifocal well-differentiated neuroendocrine tumor with intense avid lesions in the liver, mesentery, peritoneal space.  No clear increase in size of the lesions.  Many lesions have increased in radiotracer activity. -PET scan on 09/08/2020 showed no change in multifocal neuroendocrine tumor hepatic metastasis with no changes in multifocal mesenteric and peritoneal nodule or metastatic tumors.  Resolution of the right lung base pulmonary nodule.  No evidence of new metastatic disease.   2.   Normocytic anemia: -This is from combination of end-stage renal disease and relative iron deficiency and myelosuppression from everolimus.   3.  ESRD on HD: -Started on HD on 05/20/2019   PLAN:  1.  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 - Chromogranin level is ranging between 5-700. - He is tolerating everolimus very well.  No flushing or wheezing or diarrhea. - Reviewed labs today which shows white count 3.8 with normal ANC.  Platelet count is mildly low at 130.  LFTs are normal. - We will continue everolimus at the current dose.  We will continue monthly Sandostatin. - Recommend follow-up in 2 months with repeat labs and a PET scan.   2.  Normocytic anemia: - Hemoglobin is 9.3.  Continue Retacrit with hemodialysis.   3.  ESRD on HD: - Continue HD on Tuesday, Thursday and Saturday.   4.  High risk drug monitoring: - He does not report mucositis or hand-foot skin reaction. - Mild cytopenias stable.   Orders placed this encounter:  No orders of the defined types were placed in this encounter.    Derek Jack, MD [place] [phone]   I, Thana Ates, am acting as a scribe for Dr. Derek Jack.  I, Derek Jack MD, have reviewed the above documentation for accuracy and completeness, and I agree with the above.

## 2021-05-23 ENCOUNTER — Inpatient Hospital Stay (HOSPITAL_BASED_OUTPATIENT_CLINIC_OR_DEPARTMENT_OTHER): Payer: Medicare Other | Admitting: Hematology

## 2021-05-23 ENCOUNTER — Inpatient Hospital Stay (HOSPITAL_COMMUNITY): Payer: Medicare Other

## 2021-05-23 ENCOUNTER — Inpatient Hospital Stay (HOSPITAL_COMMUNITY): Payer: Medicare Other | Attending: Hematology

## 2021-05-23 ENCOUNTER — Other Ambulatory Visit: Payer: Self-pay

## 2021-05-23 VITALS — BP 102/64 | HR 84 | Temp 98.2°F | Resp 16 | Wt 184.3 lb

## 2021-05-23 DIAGNOSIS — C179 Malignant neoplasm of small intestine, unspecified: Secondary | ICD-10-CM

## 2021-05-23 DIAGNOSIS — C7B02 Secondary carcinoid tumors of liver: Secondary | ICD-10-CM | POA: Insufficient documentation

## 2021-05-23 DIAGNOSIS — C7A00 Malignant carcinoid tumor of unspecified site: Secondary | ICD-10-CM | POA: Diagnosis not present

## 2021-05-23 DIAGNOSIS — C7A019 Malignant carcinoid tumor of the small intestine, unspecified portion: Secondary | ICD-10-CM

## 2021-05-23 LAB — CBC WITH DIFFERENTIAL/PLATELET
Abs Immature Granulocytes: 0.02 10*3/uL (ref 0.00–0.07)
Basophils Absolute: 0 10*3/uL (ref 0.0–0.1)
Basophils Relative: 1 %
Eosinophils Absolute: 0.1 10*3/uL (ref 0.0–0.5)
Eosinophils Relative: 2 %
HCT: 29.8 % — ABNORMAL LOW (ref 39.0–52.0)
Hemoglobin: 9.3 g/dL — ABNORMAL LOW (ref 13.0–17.0)
Immature Granulocytes: 1 %
Lymphocytes Relative: 11 %
Lymphs Abs: 0.4 10*3/uL — ABNORMAL LOW (ref 0.7–4.0)
MCH: 28.4 pg (ref 26.0–34.0)
MCHC: 31.2 g/dL (ref 30.0–36.0)
MCV: 90.9 fL (ref 80.0–100.0)
Monocytes Absolute: 0.4 10*3/uL (ref 0.1–1.0)
Monocytes Relative: 11 %
Neutro Abs: 2.8 10*3/uL (ref 1.7–7.7)
Neutrophils Relative %: 74 %
Platelets: 130 10*3/uL — ABNORMAL LOW (ref 150–400)
RBC: 3.28 MIL/uL — ABNORMAL LOW (ref 4.22–5.81)
RDW: 13.6 % (ref 11.5–15.5)
WBC: 3.8 10*3/uL — ABNORMAL LOW (ref 4.0–10.5)
nRBC: 0 % (ref 0.0–0.2)

## 2021-05-23 LAB — COMPREHENSIVE METABOLIC PANEL
ALT: 17 U/L (ref 0–44)
AST: 32 U/L (ref 15–41)
Albumin: 3.4 g/dL — ABNORMAL LOW (ref 3.5–5.0)
Alkaline Phosphatase: 83 U/L (ref 38–126)
Anion gap: 8 (ref 5–15)
BUN: 42 mg/dL — ABNORMAL HIGH (ref 8–23)
CO2: 30 mmol/L (ref 22–32)
Calcium: 8.7 mg/dL — ABNORMAL LOW (ref 8.9–10.3)
Chloride: 99 mmol/L (ref 98–111)
Creatinine, Ser: 7.1 mg/dL — ABNORMAL HIGH (ref 0.61–1.24)
GFR, Estimated: 7 mL/min — ABNORMAL LOW (ref >60)
Glucose, Bld: 89 mg/dL (ref 70–99)
Potassium: 4.1 mmol/L (ref 3.5–5.1)
Sodium: 137 mmol/L (ref 135–145)
Total Bilirubin: 0.7 mg/dL (ref 0.3–1.2)
Total Protein: 6.1 g/dL — ABNORMAL LOW (ref 6.5–8.1)

## 2021-05-23 LAB — LACTATE DEHYDROGENASE: LDH: 155 U/L (ref 98–192)

## 2021-05-23 MED ORDER — OCTREOTIDE ACETATE 30 MG IM KIT
30.0000 mg | PACK | Freq: Once | INTRAMUSCULAR | Status: AC
  Administered 2021-05-23: 30 mg via INTRAMUSCULAR
  Filled 2021-05-23: qty 1

## 2021-05-23 NOTE — Progress Notes (Signed)
Sandostatin injection given per orders today. Patient tolerated it well without problems. Vitals stable and discharged home from clinic ambulatory. Follow up as scheduled.

## 2021-05-24 DIAGNOSIS — D631 Anemia in chronic kidney disease: Secondary | ICD-10-CM | POA: Diagnosis not present

## 2021-05-24 DIAGNOSIS — D509 Iron deficiency anemia, unspecified: Secondary | ICD-10-CM | POA: Diagnosis not present

## 2021-05-24 DIAGNOSIS — N186 End stage renal disease: Secondary | ICD-10-CM | POA: Diagnosis not present

## 2021-05-24 DIAGNOSIS — Z992 Dependence on renal dialysis: Secondary | ICD-10-CM | POA: Diagnosis not present

## 2021-05-24 DIAGNOSIS — N2581 Secondary hyperparathyroidism of renal origin: Secondary | ICD-10-CM | POA: Diagnosis not present

## 2021-05-24 LAB — CHROMOGRANIN A: Chromogranin A (ng/mL): 632.3 ng/mL — ABNORMAL HIGH (ref 0.0–101.8)

## 2021-05-26 DIAGNOSIS — N2581 Secondary hyperparathyroidism of renal origin: Secondary | ICD-10-CM | POA: Diagnosis not present

## 2021-05-26 DIAGNOSIS — D509 Iron deficiency anemia, unspecified: Secondary | ICD-10-CM | POA: Diagnosis not present

## 2021-05-26 DIAGNOSIS — D631 Anemia in chronic kidney disease: Secondary | ICD-10-CM | POA: Diagnosis not present

## 2021-05-26 DIAGNOSIS — Z992 Dependence on renal dialysis: Secondary | ICD-10-CM | POA: Diagnosis not present

## 2021-05-26 DIAGNOSIS — N186 End stage renal disease: Secondary | ICD-10-CM | POA: Diagnosis not present

## 2021-05-28 DIAGNOSIS — Z992 Dependence on renal dialysis: Secondary | ICD-10-CM | POA: Diagnosis not present

## 2021-05-28 DIAGNOSIS — N186 End stage renal disease: Secondary | ICD-10-CM | POA: Diagnosis not present

## 2021-05-28 DIAGNOSIS — D631 Anemia in chronic kidney disease: Secondary | ICD-10-CM | POA: Diagnosis not present

## 2021-05-28 DIAGNOSIS — D509 Iron deficiency anemia, unspecified: Secondary | ICD-10-CM | POA: Diagnosis not present

## 2021-05-28 DIAGNOSIS — N2581 Secondary hyperparathyroidism of renal origin: Secondary | ICD-10-CM | POA: Diagnosis not present

## 2021-05-31 ENCOUNTER — Other Ambulatory Visit (HOSPITAL_COMMUNITY): Payer: Self-pay

## 2021-05-31 DIAGNOSIS — C7A019 Malignant carcinoid tumor of the small intestine, unspecified portion: Secondary | ICD-10-CM

## 2021-05-31 DIAGNOSIS — D509 Iron deficiency anemia, unspecified: Secondary | ICD-10-CM | POA: Diagnosis not present

## 2021-05-31 DIAGNOSIS — N2581 Secondary hyperparathyroidism of renal origin: Secondary | ICD-10-CM | POA: Diagnosis not present

## 2021-05-31 DIAGNOSIS — Z992 Dependence on renal dialysis: Secondary | ICD-10-CM | POA: Diagnosis not present

## 2021-05-31 DIAGNOSIS — D631 Anemia in chronic kidney disease: Secondary | ICD-10-CM | POA: Diagnosis not present

## 2021-05-31 DIAGNOSIS — N186 End stage renal disease: Secondary | ICD-10-CM | POA: Diagnosis not present

## 2021-05-31 MED ORDER — EVEROLIMUS 5 MG PO TABS: 5.0000 mg | ORAL_TABLET | Freq: Every day | ORAL | 11 refills | Status: DC

## 2021-05-31 NOTE — Telephone Encounter (Signed)
Chart reviewed. Everolimus refilled per last office note with Dr. Delton Coombes

## 2021-06-01 DIAGNOSIS — E7849 Other hyperlipidemia: Secondary | ICD-10-CM | POA: Diagnosis not present

## 2021-06-01 DIAGNOSIS — Z0000 Encounter for general adult medical examination without abnormal findings: Secondary | ICD-10-CM | POA: Diagnosis not present

## 2021-06-01 DIAGNOSIS — N185 Chronic kidney disease, stage 5: Secondary | ICD-10-CM | POA: Diagnosis not present

## 2021-06-01 DIAGNOSIS — Z1331 Encounter for screening for depression: Secondary | ICD-10-CM | POA: Diagnosis not present

## 2021-06-01 DIAGNOSIS — I10 Essential (primary) hypertension: Secondary | ICD-10-CM | POA: Diagnosis not present

## 2021-06-01 DIAGNOSIS — M818 Other osteoporosis without current pathological fracture: Secondary | ICD-10-CM | POA: Diagnosis not present

## 2021-06-02 DIAGNOSIS — D509 Iron deficiency anemia, unspecified: Secondary | ICD-10-CM | POA: Diagnosis not present

## 2021-06-02 DIAGNOSIS — D631 Anemia in chronic kidney disease: Secondary | ICD-10-CM | POA: Diagnosis not present

## 2021-06-02 DIAGNOSIS — N2581 Secondary hyperparathyroidism of renal origin: Secondary | ICD-10-CM | POA: Diagnosis not present

## 2021-06-02 DIAGNOSIS — N186 End stage renal disease: Secondary | ICD-10-CM | POA: Diagnosis not present

## 2021-06-02 DIAGNOSIS — Z992 Dependence on renal dialysis: Secondary | ICD-10-CM | POA: Diagnosis not present

## 2021-06-03 ENCOUNTER — Telehealth (HOSPITAL_COMMUNITY): Payer: Self-pay | Admitting: Pharmacy Technician

## 2021-06-04 DIAGNOSIS — D509 Iron deficiency anemia, unspecified: Secondary | ICD-10-CM | POA: Diagnosis not present

## 2021-06-04 DIAGNOSIS — N186 End stage renal disease: Secondary | ICD-10-CM | POA: Diagnosis not present

## 2021-06-04 DIAGNOSIS — D631 Anemia in chronic kidney disease: Secondary | ICD-10-CM | POA: Diagnosis not present

## 2021-06-04 DIAGNOSIS — Z992 Dependence on renal dialysis: Secondary | ICD-10-CM | POA: Diagnosis not present

## 2021-06-04 DIAGNOSIS — N2581 Secondary hyperparathyroidism of renal origin: Secondary | ICD-10-CM | POA: Diagnosis not present

## 2021-06-06 DIAGNOSIS — E1151 Type 2 diabetes mellitus with diabetic peripheral angiopathy without gangrene: Secondary | ICD-10-CM | POA: Diagnosis not present

## 2021-06-06 DIAGNOSIS — E1140 Type 2 diabetes mellitus with diabetic neuropathy, unspecified: Secondary | ICD-10-CM | POA: Diagnosis not present

## 2021-06-07 DIAGNOSIS — N186 End stage renal disease: Secondary | ICD-10-CM | POA: Diagnosis not present

## 2021-06-07 DIAGNOSIS — D509 Iron deficiency anemia, unspecified: Secondary | ICD-10-CM | POA: Diagnosis not present

## 2021-06-07 DIAGNOSIS — Z992 Dependence on renal dialysis: Secondary | ICD-10-CM | POA: Diagnosis not present

## 2021-06-07 DIAGNOSIS — N2581 Secondary hyperparathyroidism of renal origin: Secondary | ICD-10-CM | POA: Diagnosis not present

## 2021-06-07 DIAGNOSIS — D631 Anemia in chronic kidney disease: Secondary | ICD-10-CM | POA: Diagnosis not present

## 2021-06-09 DIAGNOSIS — D631 Anemia in chronic kidney disease: Secondary | ICD-10-CM | POA: Diagnosis not present

## 2021-06-09 DIAGNOSIS — N2581 Secondary hyperparathyroidism of renal origin: Secondary | ICD-10-CM | POA: Diagnosis not present

## 2021-06-09 DIAGNOSIS — N186 End stage renal disease: Secondary | ICD-10-CM | POA: Diagnosis not present

## 2021-06-09 DIAGNOSIS — Z992 Dependence on renal dialysis: Secondary | ICD-10-CM | POA: Diagnosis not present

## 2021-06-09 DIAGNOSIS — D509 Iron deficiency anemia, unspecified: Secondary | ICD-10-CM | POA: Diagnosis not present

## 2021-06-11 DIAGNOSIS — D631 Anemia in chronic kidney disease: Secondary | ICD-10-CM | POA: Diagnosis not present

## 2021-06-11 DIAGNOSIS — N2581 Secondary hyperparathyroidism of renal origin: Secondary | ICD-10-CM | POA: Diagnosis not present

## 2021-06-11 DIAGNOSIS — D509 Iron deficiency anemia, unspecified: Secondary | ICD-10-CM | POA: Diagnosis not present

## 2021-06-11 DIAGNOSIS — N186 End stage renal disease: Secondary | ICD-10-CM | POA: Diagnosis not present

## 2021-06-11 DIAGNOSIS — Z992 Dependence on renal dialysis: Secondary | ICD-10-CM | POA: Diagnosis not present

## 2021-06-14 DIAGNOSIS — N186 End stage renal disease: Secondary | ICD-10-CM | POA: Diagnosis not present

## 2021-06-14 DIAGNOSIS — N2581 Secondary hyperparathyroidism of renal origin: Secondary | ICD-10-CM | POA: Diagnosis not present

## 2021-06-14 DIAGNOSIS — D631 Anemia in chronic kidney disease: Secondary | ICD-10-CM | POA: Diagnosis not present

## 2021-06-14 DIAGNOSIS — Z992 Dependence on renal dialysis: Secondary | ICD-10-CM | POA: Diagnosis not present

## 2021-06-14 DIAGNOSIS — D509 Iron deficiency anemia, unspecified: Secondary | ICD-10-CM | POA: Diagnosis not present

## 2021-06-15 DIAGNOSIS — Z992 Dependence on renal dialysis: Secondary | ICD-10-CM | POA: Diagnosis not present

## 2021-06-15 DIAGNOSIS — N186 End stage renal disease: Secondary | ICD-10-CM | POA: Diagnosis not present

## 2021-06-16 ENCOUNTER — Other Ambulatory Visit (HOSPITAL_COMMUNITY): Payer: Self-pay

## 2021-06-16 DIAGNOSIS — N2581 Secondary hyperparathyroidism of renal origin: Secondary | ICD-10-CM | POA: Diagnosis not present

## 2021-06-16 DIAGNOSIS — C7A019 Malignant carcinoid tumor of the small intestine, unspecified portion: Secondary | ICD-10-CM

## 2021-06-16 DIAGNOSIS — D509 Iron deficiency anemia, unspecified: Secondary | ICD-10-CM | POA: Diagnosis not present

## 2021-06-16 DIAGNOSIS — Z992 Dependence on renal dialysis: Secondary | ICD-10-CM | POA: Diagnosis not present

## 2021-06-16 DIAGNOSIS — D631 Anemia in chronic kidney disease: Secondary | ICD-10-CM | POA: Diagnosis not present

## 2021-06-16 DIAGNOSIS — N186 End stage renal disease: Secondary | ICD-10-CM | POA: Diagnosis not present

## 2021-06-16 MED ORDER — EVEROLIMUS 5 MG PO TABS: 5.0000 mg | ORAL_TABLET | Freq: Every day | ORAL | 11 refills | Status: DC

## 2021-06-16 NOTE — Telephone Encounter (Signed)
Chart reviewed. Afinitor refilled per last office visit with Dr. Delton Coombes

## 2021-06-18 DIAGNOSIS — D509 Iron deficiency anemia, unspecified: Secondary | ICD-10-CM | POA: Diagnosis not present

## 2021-06-18 DIAGNOSIS — N2581 Secondary hyperparathyroidism of renal origin: Secondary | ICD-10-CM | POA: Diagnosis not present

## 2021-06-18 DIAGNOSIS — N186 End stage renal disease: Secondary | ICD-10-CM | POA: Diagnosis not present

## 2021-06-18 DIAGNOSIS — D631 Anemia in chronic kidney disease: Secondary | ICD-10-CM | POA: Diagnosis not present

## 2021-06-18 DIAGNOSIS — Z992 Dependence on renal dialysis: Secondary | ICD-10-CM | POA: Diagnosis not present

## 2021-06-20 ENCOUNTER — Ambulatory Visit (HOSPITAL_COMMUNITY): Payer: Medicare Other

## 2021-06-20 ENCOUNTER — Ambulatory Visit (HOSPITAL_COMMUNITY)
Admission: RE | Admit: 2021-06-20 | Discharge: 2021-06-20 | Disposition: A | Discharge status: Home / Self Care | Payer: Medicare Other | Source: Ambulatory Visit | Attending: Hematology | Admitting: Hematology

## 2021-06-20 ENCOUNTER — Other Ambulatory Visit: Payer: Self-pay

## 2021-06-20 ENCOUNTER — Other Ambulatory Visit (HOSPITAL_COMMUNITY): Payer: Medicare Other

## 2021-06-20 DIAGNOSIS — C786 Secondary malignant neoplasm of retroperitoneum and peritoneum: Secondary | ICD-10-CM | POA: Diagnosis not present

## 2021-06-20 DIAGNOSIS — K429 Umbilical hernia without obstruction or gangrene: Secondary | ICD-10-CM | POA: Diagnosis not present

## 2021-06-20 DIAGNOSIS — C787 Secondary malignant neoplasm of liver and intrahepatic bile duct: Secondary | ICD-10-CM | POA: Insufficient documentation

## 2021-06-20 DIAGNOSIS — C7A00 Malignant carcinoid tumor of unspecified site: Secondary | ICD-10-CM | POA: Insufficient documentation

## 2021-06-20 DIAGNOSIS — C7A8 Other malignant neuroendocrine tumors: Secondary | ICD-10-CM | POA: Diagnosis not present

## 2021-06-20 DIAGNOSIS — Z86012 Personal history of benign carcinoid tumor: Secondary | ICD-10-CM | POA: Diagnosis not present

## 2021-06-20 DIAGNOSIS — K7689 Other specified diseases of liver: Secondary | ICD-10-CM | POA: Diagnosis not present

## 2021-06-20 MED ORDER — COPPER CU 64 DOTATATE 1 MCI/ML IV SOLN
4.0000 | Freq: Once | INTRAVENOUS | Status: AC
  Administered 2021-06-20: 4.2 via INTRAVENOUS

## 2021-06-21 DIAGNOSIS — D509 Iron deficiency anemia, unspecified: Secondary | ICD-10-CM | POA: Diagnosis not present

## 2021-06-21 DIAGNOSIS — Z992 Dependence on renal dialysis: Secondary | ICD-10-CM | POA: Diagnosis not present

## 2021-06-21 DIAGNOSIS — D631 Anemia in chronic kidney disease: Secondary | ICD-10-CM | POA: Diagnosis not present

## 2021-06-21 DIAGNOSIS — N186 End stage renal disease: Secondary | ICD-10-CM | POA: Diagnosis not present

## 2021-06-21 DIAGNOSIS — N2581 Secondary hyperparathyroidism of renal origin: Secondary | ICD-10-CM | POA: Diagnosis not present

## 2021-06-22 ENCOUNTER — Encounter (HOSPITAL_COMMUNITY): Payer: Self-pay

## 2021-06-22 ENCOUNTER — Other Ambulatory Visit: Payer: Self-pay

## 2021-06-22 ENCOUNTER — Inpatient Hospital Stay (HOSPITAL_COMMUNITY): Payer: Medicare Other

## 2021-06-22 ENCOUNTER — Inpatient Hospital Stay (HOSPITAL_COMMUNITY): Payer: Medicare Other | Attending: Hematology

## 2021-06-22 VITALS — BP 103/71 | HR 76 | Temp 98.2°F | Resp 20 | Ht 77.0 in | Wt 183.2 lb

## 2021-06-22 DIAGNOSIS — C7A00 Malignant carcinoid tumor of unspecified site: Secondary | ICD-10-CM | POA: Insufficient documentation

## 2021-06-22 DIAGNOSIS — C7A019 Malignant carcinoid tumor of the small intestine, unspecified portion: Secondary | ICD-10-CM

## 2021-06-22 DIAGNOSIS — C7B02 Secondary carcinoid tumors of liver: Secondary | ICD-10-CM | POA: Diagnosis not present

## 2021-06-22 DIAGNOSIS — C179 Malignant neoplasm of small intestine, unspecified: Secondary | ICD-10-CM

## 2021-06-22 LAB — CBC WITH DIFFERENTIAL/PLATELET
Abs Immature Granulocytes: 0.01 10*3/uL (ref 0.00–0.07)
Basophils Absolute: 0 10*3/uL (ref 0.0–0.1)
Basophils Relative: 1 %
Eosinophils Absolute: 0.1 10*3/uL (ref 0.0–0.5)
Eosinophils Relative: 2 %
HCT: 32 % — ABNORMAL LOW (ref 39.0–52.0)
Hemoglobin: 10.3 g/dL — ABNORMAL LOW (ref 13.0–17.0)
Immature Granulocytes: 0 %
Lymphocytes Relative: 10 %
Lymphs Abs: 0.4 10*3/uL — ABNORMAL LOW (ref 0.7–4.0)
MCH: 28.6 pg (ref 26.0–34.0)
MCHC: 32.2 g/dL (ref 30.0–36.0)
MCV: 88.9 fL (ref 80.0–100.0)
Monocytes Absolute: 0.5 10*3/uL (ref 0.1–1.0)
Monocytes Relative: 13 %
Neutro Abs: 3 10*3/uL (ref 1.7–7.7)
Neutrophils Relative %: 74 %
Platelets: 129 10*3/uL — ABNORMAL LOW (ref 150–400)
RBC: 3.6 MIL/uL — ABNORMAL LOW (ref 4.22–5.81)
RDW: 13.1 % (ref 11.5–15.5)
WBC: 4.1 10*3/uL (ref 4.0–10.5)
nRBC: 0 % (ref 0.0–0.2)

## 2021-06-22 LAB — COMPREHENSIVE METABOLIC PANEL
ALT: 21 U/L (ref 0–44)
AST: 32 U/L (ref 15–41)
Albumin: 3.5 g/dL (ref 3.5–5.0)
Alkaline Phosphatase: 75 U/L (ref 38–126)
Anion gap: 10 (ref 5–15)
BUN: 44 mg/dL — ABNORMAL HIGH (ref 8–23)
CO2: 29 mmol/L (ref 22–32)
Calcium: 8.7 mg/dL — ABNORMAL LOW (ref 8.9–10.3)
Chloride: 98 mmol/L (ref 98–111)
Creatinine, Ser: 7.21 mg/dL — ABNORMAL HIGH (ref 0.61–1.24)
GFR, Estimated: 7 mL/min — ABNORMAL LOW (ref >60)
Glucose, Bld: 74 mg/dL (ref 70–99)
Potassium: 3.7 mmol/L (ref 3.5–5.1)
Sodium: 137 mmol/L (ref 135–145)
Total Bilirubin: 0.5 mg/dL (ref 0.3–1.2)
Total Protein: 6.2 g/dL — ABNORMAL LOW (ref 6.5–8.1)

## 2021-06-22 LAB — LACTATE DEHYDROGENASE: LDH: 158 U/L (ref 98–192)

## 2021-06-22 MED ORDER — OCTREOTIDE ACETATE 30 MG IM KIT
30.0000 mg | PACK | Freq: Once | INTRAMUSCULAR | Status: AC
  Administered 2021-06-22: 30 mg via INTRAMUSCULAR
  Filled 2021-06-22: qty 1

## 2021-06-22 NOTE — Patient Instructions (Signed)
[facility]  Discharge Instructions: Thank you for choosing [facility] to provide your oncology and hematology care.  If you have a lab appointment with the Cancer Center, please come in thru the Main Entrance and check in at the main information desk.  Wear comfortable clothing and clothing appropriate for easy access to any Portacath or PICC line.   We strive to give you quality time with your provider. You may need to reschedule your appointment if you arrive late (15 or more minutes).  Arriving late affects you and other patients whose appointments are after yours.  Also, if you miss three or more appointments without notifying the office, you may be dismissed from the clinic at the provider's discretion.      For prescription refill requests, have your pharmacy contact our office and allow 72 hours for refills to be completed.    Today you received the following chemotherapy and/or immunotherapy agents sandostatin.       To help prevent nausea and vomiting after your treatment, we encourage you to take your nausea medication as directed.  BELOW ARE SYMPTOMS THAT SHOULD BE REPORTED IMMEDIATELY: *FEVER GREATER THAN 100.4 F (38 C) OR HIGHER *CHILLS OR SWEATING *NAUSEA AND VOMITING THAT IS NOT CONTROLLED WITH YOUR NAUSEA MEDICATION *UNUSUAL SHORTNESS OF BREATH *UNUSUAL BRUISING OR BLEEDING *URINARY PROBLEMS (pain or burning when urinating, or frequent urination) *BOWEL PROBLEMS (unusual diarrhea, constipation, pain near the anus) TENDERNESS IN MOUTH AND THROAT WITH OR WITHOUT PRESENCE OF ULCERS (sore throat, sores in mouth, or a toothache) UNUSUAL RASH, SWELLING OR PAIN  UNUSUAL VAGINAL DISCHARGE OR ITCHING   Items with * indicate a potential emergency and should be followed up as soon as possible or go to the Emergency Department if any problems should occur.  Please show the CHEMOTHERAPY ALERT CARD or IMMUNOTHERAPY ALERT CARD at check-in to the Emergency  Department and triage nurse.  Should you have questions after your visit or need to cancel or reschedule your appointment, please contact [facility] [phone]  and follow the prompts.  Office hours are 8:00 a.m. to 4:30 p.m. Monday - Friday. Please note that voicemails left after 4:00 p.m. may not be returned until the following business day.  We are closed weekends and major holidays. You have access to a nurse at all times for urgent questions. Please call the main number to the clinic [phone] and follow the prompts.  For any non-urgent questions, you may also contact your provider using MyChart. We now offer e-Visits for anyone 18 and older to request care online for non-urgent symptoms. For details visit [email or web address].   Also download the MyChart app! Go to the app store, search "MyChart", open the app, select [facility], and log in with your MyChart username and password.  Due to Covid, a mask is required upon entering the hospital/clinic. If you do not have a mask, one will be given to you upon arrival. For doctor visits, patients may have 1 support person aged 18 or older with them. For treatment visits, patients cannot have anyone with them due to current Covid guidelines and our immunocompromised population.  

## 2021-06-22 NOTE — Progress Notes (Addendum)
Allen Davenport presents today for injection per the provider's orders.  Sandostatin 30 mg in right upper outer quadrant administration without incident; injection site WNL; see MAR for injection details.  Patient tolerated procedure well and without incident.  No questions or complaints noted at this time. Stable during and after injection.  AVS reviewed.  Vital signs stable.  Discharged in stable condition ambulatory.   Monthly sandostatin.  Last injection given 05/23/2021.

## 2021-06-23 DIAGNOSIS — D631 Anemia in chronic kidney disease: Secondary | ICD-10-CM | POA: Diagnosis not present

## 2021-06-23 DIAGNOSIS — D509 Iron deficiency anemia, unspecified: Secondary | ICD-10-CM | POA: Diagnosis not present

## 2021-06-23 DIAGNOSIS — N186 End stage renal disease: Secondary | ICD-10-CM | POA: Diagnosis not present

## 2021-06-23 DIAGNOSIS — Z992 Dependence on renal dialysis: Secondary | ICD-10-CM | POA: Diagnosis not present

## 2021-06-23 DIAGNOSIS — N2581 Secondary hyperparathyroidism of renal origin: Secondary | ICD-10-CM | POA: Diagnosis not present

## 2021-06-23 LAB — CHROMOGRANIN A: Chromogranin A (ng/mL): 702.6 ng/mL — ABNORMAL HIGH (ref 0.0–101.8)

## 2021-06-25 DIAGNOSIS — N186 End stage renal disease: Secondary | ICD-10-CM | POA: Diagnosis not present

## 2021-06-25 DIAGNOSIS — Z992 Dependence on renal dialysis: Secondary | ICD-10-CM | POA: Diagnosis not present

## 2021-06-25 DIAGNOSIS — D509 Iron deficiency anemia, unspecified: Secondary | ICD-10-CM | POA: Diagnosis not present

## 2021-06-25 DIAGNOSIS — N2581 Secondary hyperparathyroidism of renal origin: Secondary | ICD-10-CM | POA: Diagnosis not present

## 2021-06-25 DIAGNOSIS — D631 Anemia in chronic kidney disease: Secondary | ICD-10-CM | POA: Diagnosis not present

## 2021-06-28 DIAGNOSIS — N2581 Secondary hyperparathyroidism of renal origin: Secondary | ICD-10-CM | POA: Diagnosis not present

## 2021-06-28 DIAGNOSIS — D631 Anemia in chronic kidney disease: Secondary | ICD-10-CM | POA: Diagnosis not present

## 2021-06-28 DIAGNOSIS — Z992 Dependence on renal dialysis: Secondary | ICD-10-CM | POA: Diagnosis not present

## 2021-06-28 DIAGNOSIS — N186 End stage renal disease: Secondary | ICD-10-CM | POA: Diagnosis not present

## 2021-06-28 DIAGNOSIS — D509 Iron deficiency anemia, unspecified: Secondary | ICD-10-CM | POA: Diagnosis not present

## 2021-06-30 DIAGNOSIS — N2581 Secondary hyperparathyroidism of renal origin: Secondary | ICD-10-CM | POA: Diagnosis not present

## 2021-06-30 DIAGNOSIS — D509 Iron deficiency anemia, unspecified: Secondary | ICD-10-CM | POA: Diagnosis not present

## 2021-06-30 DIAGNOSIS — D631 Anemia in chronic kidney disease: Secondary | ICD-10-CM | POA: Diagnosis not present

## 2021-06-30 DIAGNOSIS — N186 End stage renal disease: Secondary | ICD-10-CM | POA: Diagnosis not present

## 2021-06-30 DIAGNOSIS — Z992 Dependence on renal dialysis: Secondary | ICD-10-CM | POA: Diagnosis not present

## 2021-07-02 DIAGNOSIS — D509 Iron deficiency anemia, unspecified: Secondary | ICD-10-CM | POA: Diagnosis not present

## 2021-07-02 DIAGNOSIS — Z992 Dependence on renal dialysis: Secondary | ICD-10-CM | POA: Diagnosis not present

## 2021-07-02 DIAGNOSIS — N186 End stage renal disease: Secondary | ICD-10-CM | POA: Diagnosis not present

## 2021-07-02 DIAGNOSIS — D631 Anemia in chronic kidney disease: Secondary | ICD-10-CM | POA: Diagnosis not present

## 2021-07-02 DIAGNOSIS — N2581 Secondary hyperparathyroidism of renal origin: Secondary | ICD-10-CM | POA: Diagnosis not present

## 2021-07-05 DIAGNOSIS — N2581 Secondary hyperparathyroidism of renal origin: Secondary | ICD-10-CM | POA: Diagnosis not present

## 2021-07-05 DIAGNOSIS — D509 Iron deficiency anemia, unspecified: Secondary | ICD-10-CM | POA: Diagnosis not present

## 2021-07-05 DIAGNOSIS — N186 End stage renal disease: Secondary | ICD-10-CM | POA: Diagnosis not present

## 2021-07-05 DIAGNOSIS — Z992 Dependence on renal dialysis: Secondary | ICD-10-CM | POA: Diagnosis not present

## 2021-07-05 DIAGNOSIS — D631 Anemia in chronic kidney disease: Secondary | ICD-10-CM | POA: Diagnosis not present

## 2021-07-07 DIAGNOSIS — Z992 Dependence on renal dialysis: Secondary | ICD-10-CM | POA: Diagnosis not present

## 2021-07-07 DIAGNOSIS — N186 End stage renal disease: Secondary | ICD-10-CM | POA: Diagnosis not present

## 2021-07-07 DIAGNOSIS — D509 Iron deficiency anemia, unspecified: Secondary | ICD-10-CM | POA: Diagnosis not present

## 2021-07-07 DIAGNOSIS — D631 Anemia in chronic kidney disease: Secondary | ICD-10-CM | POA: Diagnosis not present

## 2021-07-07 DIAGNOSIS — N2581 Secondary hyperparathyroidism of renal origin: Secondary | ICD-10-CM | POA: Diagnosis not present

## 2021-07-09 DIAGNOSIS — D631 Anemia in chronic kidney disease: Secondary | ICD-10-CM | POA: Diagnosis not present

## 2021-07-09 DIAGNOSIS — N2581 Secondary hyperparathyroidism of renal origin: Secondary | ICD-10-CM | POA: Diagnosis not present

## 2021-07-09 DIAGNOSIS — D509 Iron deficiency anemia, unspecified: Secondary | ICD-10-CM | POA: Diagnosis not present

## 2021-07-09 DIAGNOSIS — N186 End stage renal disease: Secondary | ICD-10-CM | POA: Diagnosis not present

## 2021-07-09 DIAGNOSIS — Z992 Dependence on renal dialysis: Secondary | ICD-10-CM | POA: Diagnosis not present

## 2021-07-12 DIAGNOSIS — D631 Anemia in chronic kidney disease: Secondary | ICD-10-CM | POA: Diagnosis not present

## 2021-07-12 DIAGNOSIS — I259 Chronic ischemic heart disease, unspecified: Secondary | ICD-10-CM | POA: Diagnosis not present

## 2021-07-12 DIAGNOSIS — Z992 Dependence on renal dialysis: Secondary | ICD-10-CM | POA: Diagnosis not present

## 2021-07-12 DIAGNOSIS — D509 Iron deficiency anemia, unspecified: Secondary | ICD-10-CM | POA: Diagnosis not present

## 2021-07-12 DIAGNOSIS — N186 End stage renal disease: Secondary | ICD-10-CM | POA: Diagnosis not present

## 2021-07-12 DIAGNOSIS — N2581 Secondary hyperparathyroidism of renal origin: Secondary | ICD-10-CM | POA: Diagnosis not present

## 2021-07-14 DIAGNOSIS — N186 End stage renal disease: Secondary | ICD-10-CM | POA: Diagnosis not present

## 2021-07-14 DIAGNOSIS — N2581 Secondary hyperparathyroidism of renal origin: Secondary | ICD-10-CM | POA: Diagnosis not present

## 2021-07-14 DIAGNOSIS — D509 Iron deficiency anemia, unspecified: Secondary | ICD-10-CM | POA: Diagnosis not present

## 2021-07-14 DIAGNOSIS — D631 Anemia in chronic kidney disease: Secondary | ICD-10-CM | POA: Diagnosis not present

## 2021-07-14 DIAGNOSIS — Z992 Dependence on renal dialysis: Secondary | ICD-10-CM | POA: Diagnosis not present

## 2021-07-16 DIAGNOSIS — Z992 Dependence on renal dialysis: Secondary | ICD-10-CM | POA: Diagnosis not present

## 2021-07-16 DIAGNOSIS — N2581 Secondary hyperparathyroidism of renal origin: Secondary | ICD-10-CM | POA: Diagnosis not present

## 2021-07-16 DIAGNOSIS — D509 Iron deficiency anemia, unspecified: Secondary | ICD-10-CM | POA: Diagnosis not present

## 2021-07-16 DIAGNOSIS — N186 End stage renal disease: Secondary | ICD-10-CM | POA: Diagnosis not present

## 2021-07-16 DIAGNOSIS — D631 Anemia in chronic kidney disease: Secondary | ICD-10-CM | POA: Diagnosis not present

## 2021-07-19 DIAGNOSIS — N186 End stage renal disease: Secondary | ICD-10-CM | POA: Diagnosis not present

## 2021-07-19 DIAGNOSIS — D509 Iron deficiency anemia, unspecified: Secondary | ICD-10-CM | POA: Diagnosis not present

## 2021-07-19 DIAGNOSIS — N2581 Secondary hyperparathyroidism of renal origin: Secondary | ICD-10-CM | POA: Diagnosis not present

## 2021-07-19 DIAGNOSIS — D631 Anemia in chronic kidney disease: Secondary | ICD-10-CM | POA: Diagnosis not present

## 2021-07-19 DIAGNOSIS — Z992 Dependence on renal dialysis: Secondary | ICD-10-CM | POA: Diagnosis not present

## 2021-07-19 NOTE — Progress Notes (Signed)
[place], [place] [address]   CLINIC:  Medical Oncology/Hematology  PCP:  [name], MD [place] / [place] [address] [phone]   REASON FOR VISIT:  Follow-up for metastatic carcinoid tumor of small intestine  PRIOR THERAPY: none  NGS Results: not done  CURRENT THERAPY: Everolimus daily & Sandostatin monthly  BRIEF ONCOLOGIC HISTORY:  Oncology History   No history exists.    CANCER STAGING:  Cancer Staging  No matching staging information was found for the patient.  INTERVAL HISTORY:  [name], a 81 y.o. male, returns for routine follow-up of his metastatic carcinoid tumor of small intestine. Allen Davenport was last seen on 05/23/2021.   Today he reports feeling good. He denies diarrhea, flushing, wheezing, abdominal pain, mouth sores, recent infections, and cramping. He urinates 3-4 times daily.   REVIEW OF SYSTEMS:  Review of Systems  Constitutional:  Negative for appetite change and fatigue.  HENT:   Negative for mouth sores.   Respiratory:  Negative for wheezing.   Gastrointestinal:  Negative for abdominal pain and diarrhea.  All other systems reviewed and are negative.  PAST MEDICAL/SURGICAL HISTORY:  Past Medical History:  Diagnosis Date   Anemia    Cancer ([facility])    right renal . Prostate- Radiation treatment   CHF (congestive heart failure) (HCC)    Chronic kidney disease    Dialysis T/TH/Sa   Edema    Heart murmur    "nothing to worry about"   Hyperlipidemia    Hypertension    Malignant carcinoid tumor ([facility]) 01/03/2016   Pneumonia    as a child   Past Surgical History:  Procedure Laterality Date   AV FISTULA PLACEMENT Left 10/20/2014   Procedure: Left Arm ARTERIOVENOUS (AV) FISTULA CREATION;  Surgeon: Elam Dutch, MD;  Location: [facility];  Service: Vascular;  Laterality: Left;   NEPHRECTOMY Right 2010   PERIPHERAL VASCULAR BALLOON ANGIOPLASTY  06/20/2019   Procedure: PERIPHERAL  VASCULAR BALLOON ANGIOPLASTY;  Surgeon: Elam Dutch, MD;  Location: [facility];  Service: Cardiovascular;;   REVISON OF ARTERIOVENOUS FISTULA Left 08/04/2019   Procedure: REVISON OF ARTERIOVENOUS FISTULA WITH SIDE BRANCH LIGATION;  Surgeon: Elam Dutch, MD;  Location: [facility];  Service: Vascular;  Laterality: Left;    SOCIAL HISTORY:  Social History   Socioeconomic History   Marital status: Married    Spouse name: Not on file   Number of children: Not on file   Years of education: Not on file   Highest education level: Not on file  Occupational History   Not on file  Tobacco Use   Smoking status: Never   Smokeless tobacco: Never  Vaping Use   Vaping Use: Never used  Substance and Sexual Activity   Alcohol use: No    Alcohol/week: 0.0 standard drinks   Drug use: No   Sexual activity: Not on file  Other Topics Concern   Not on file  Social History Narrative   Not on file   Social Determinants of Health   Financial Resource Strain: Not on file  Food Insecurity: Not on file  Transportation Needs: Not on file  Physical Activity: Not on file  Stress: Not on file  Social Connections: Not on file  Intimate Partner Violence: Not on file    FAMILY HISTORY:  Family History  Problem Relation Age of Onset   Cancer Mother    Hypertension Father  Cancer Sister     CURRENT MEDICATIONS:  Current Outpatient Medications  Medication Sig Dispense Refill   atorvastatin (LIPITOR) 80 MG tablet Take 80 mg by mouth daily at 6 PM.      calcitRIOL (ROCALTROL) 0.25 MCG capsule Take 0.25 mcg by mouth daily.      calcium acetate (PHOSLO) 667 MG capsule Take by mouth.     carvedilol (COREG) 3.125 MG tablet Take 3.125 mg by mouth 2 (two) times daily with a meal.      carvedilol (COREG) 6.25 MG tablet Take 6.25 mg by mouth 2 (two) times daily.     everolimus (AFINITOR) 5 MG tablet Take 1 tablet (5 mg total) by mouth daily. 28 tablet 11   furosemide (LASIX) 40 MG tablet  Take 40 mg by mouth every morning.     octreotide (SANDOSTATIN LAR) 30 MG injection Inject 30 mg into the muscle every 28 (twenty-eight) days.      Omega-3 Fatty Acids (FISH OIL) 1000 MG CAPS Take 1,000 mg by mouth daily.      pyridOXINE (B-6) 50 MG tablet Take 1 tablet (50 mg total) by mouth daily. 30 tablet 8   sodium bicarbonate 650 MG tablet Take 650 mg by mouth daily.     No current facility-administered medications for this visit.    ALLERGIES:  No Known Allergies  PHYSICAL EXAM:  Performance status (ECOG): 1 - Symptomatic but completely ambulatory  Vitals:   07/20/21 1104  BP: 108/65  Pulse: 84  Resp: 17  Temp: (!) 96.9 F (36.1 C)  SpO2: 100%   Wt Readings from Last 3 Encounters:  07/20/21 183 lb 3.2 oz (83.1 kg)  06/22/21 183 lb 3.2 oz (83.1 kg)  05/23/21 184 lb 4.8 oz (83.6 kg)   Physical Exam Vitals reviewed.  Constitutional:      Appearance: Normal appearance.  Cardiovascular:     Rate and Rhythm: Normal rate and regular rhythm.     Pulses: Normal pulses.     Heart sounds: Normal heart sounds.  Pulmonary:     Effort: Pulmonary effort is normal.     Breath sounds: Normal breath sounds.  Musculoskeletal:     Right lower leg: 1+ Edema present.     Left lower leg: 1+ Edema present.  Neurological:     General: No focal deficit present.     Mental Status: He is alert and oriented to person, place, and time.  Psychiatric:        Mood and Affect: Mood normal.        Behavior: Behavior normal.     LABORATORY DATA:  I have reviewed the labs as listed.  CBC Latest Ref Rng & Units 07/20/2021 06/22/2021 05/23/2021  WBC 4.0 - 10.5 K/uL 3.5(L) 4.1 3.8(L)  Hemoglobin 13.0 - 17.0 g/dL 10.7(L) 10.3(L) 9.3(L)  Hematocrit 39.0 - 52.0 % 32.6(L) 32.0(L) 29.8(L)  Platelets 150 - 400 K/uL 112(L) 129(L) 130(L)   CMP Latest Ref Rng & Units 07/20/2021 06/22/2021 05/23/2021  Glucose 70 - 99 mg/dL 146(H) 74 89  BUN 8 - 23 mg/dL 42(H) 44(H) 42(H)  Creatinine 0.61 - 1.24 mg/dL  6.65(H) 7.21(H) 7.10(H)  Sodium 135 - 145 mmol/L 136 137 137  Potassium 3.5 - 5.1 mmol/L 3.5 3.7 4.1  Chloride 98 - 111 mmol/L 99 98 99  CO2 22 - 32 mmol/L 28 29 30   Calcium 8.9 - 10.3 mg/dL 8.5(L) 8.7(L) 8.7(L)  Total Protein 6.5 - 8.1 g/dL 6.1(L) 6.2(L) 6.1(L)  Total Bilirubin 0.3 -  1.2 mg/dL 0.4 0.5 0.7  Alkaline Phos 38 - 126 U/L 73 75 83  AST 15 - 41 U/L 26 32 32  ALT 0 - 44 U/L 16 21 17     DIAGNOSTIC IMAGING:  I have independently reviewed the scans and discussed with the patient. NM PET (CU-64 DETECTNET)SKULL TO MID THIGH  Result Date: 06/20/2021 CLINICAL DATA:  Metastatic neuroendocrine tumor. EXAM: NUCLEAR MEDICINE PET SKULL BASE TO THIGH TECHNIQUE: 4.2 mCi copper 26 DOTATATE was injected intravenously. Full-ring PET imaging was performed from the skull base to thigh after the radiotracer. CT data was obtained and used for attenuation correction and anatomic localization. COMPARISON:  DOTATATE PET scan 09/08/2020 FINDINGS: NECK No radiotracer activity in neck lymph nodes. Incidental CT findings: None CHEST No radiotracer accumulation within mediastinal or hilar lymph nodes. No suspicious pulmonary nodules on the CT scan. Incidental CT finding:Radiotracer avid nodules at the RIGHT lung base are favored to be peritoneal nodules beneath the hemidiaphragm. ABDOMEN/PELVIS Radiotracer avid lesion in the central LEFT hepatic lobe with SUV max equal 26.2 increased from SUV max equal 15.7 (image 106 of the fused data set). Subtle hypointense lesion on noncontrast CT measuring 16 mm (104/CT series 4). This appears slightly increased in size from comparison exam. Several discrete nodules in the inferior RIGHT hepatic lobe are similar with SUV max equal 11.2 (image 123) compared SUV max equal 20 comparison exam. Similar posterior RIGHT hepatic lobe lesion with SUV max equal 9.3 compared SUV max equal 13.9. small lesion not evident on the CT portion exam. Again demonstrated multiple lesions over the  dome the RIGHT hepatic lobe. Favor lesions to the present nodules in the sub diaphragmatic peritoneal space. Cannot exclude liver parenchymal lesions. Again noted there is misregistration due to breathing artifact. No significant change Again demonstrated, several foci of peritoneal nodular metastasis. Multilobular cluster in the peritoneal space of the RIGHT upper quadrant measures 3.2 x 2.4 cm (image 140/4 )with SUV max equal 16.4 compared to 4.3 x 1.8 cm with SUV max equal 25.1. Intense activity associated with a peritoneal implant within umbilical hernia measuring 2.5 cm with SUV max equal 17.1 unchanged from 2.5 cm with SUV max equal 13.4. Collection of nodular metastasis in the posterior cul-de-sac unchanged in size and with SUV max equal 46 compared with SUV max equal 43. Additional peritoneal radiotracer avid nodules are noted. No interval change. No new peritoneal lesions identified Physiologic activity noted in the liver, spleen, adrenal glands and kidneys. Incidental CT findings:None SKELETON No focal activity to suggest skeletal metastasis. Incidental CT findings:None IMPRESSION: 1. Overall minimal interval change from DOTATATE PET scan 09/08/2020. Persistent well differentiated neuroendocrine tumor metastasis liver and peritoneal space. No new lesions identified. 2. One lesion in the central LEFT hepatic lobe is increased in size or radiotracer activity. 3. Extensive nodular peritoneal well different neuroendocrine tumor metastasis are unchanged. Electronically Signed   By: Suzy Bouchard M.D.   On: 06/20/2021 12:20     ASSESSMENT:  1.  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  Everolimus 5 mg daily started on 05/17/2018. -PET scan on 10/29/2019 showed multifocal well-differentiated neuroendocrine tumor with intense avid lesions in the liver, mesentery, peritoneal space.  No clear increase in size of the lesions.  Many lesions have  increased in radiotracer activity. -PET scan on 09/08/2020 showed no change in multifocal neuroendocrine tumor hepatic metastasis with no changes in multifocal mesenteric and peritoneal nodule or metastatic tumors.  Resolution of  the right lung base pulmonary nodule.  No evidence of new metastatic disease.   2.  Normocytic anemia: -This is from combination of end-stage renal disease and relative iron deficiency and myelosuppression from everolimus.   3.  ESRD on HD: -Started on HD on 05/20/2019   PLAN:  1.  Malignant carcinoid tumor to the liver and peritoneum: - PET scan on 06/20/2021 was reviewed by me which showed stable findings since 09/08/2020 with persistent metastasis in the liver and peritoneal space with no new lesions identified.  1 lesion in the central left hepatic lobe has increased just slightly in size and activity.  Extensive nodular peritoneal mets are unchanged. - Continue everolimus 5 mg daily which is being tolerated very well. - Reviewed labs from today which showed white count 3.5 with ANC normal.  Platelet count is 112.  LFTs are normal. - Chromogranin is stable between 500-700, last value 702. - He does not have any symptoms of carcinoid syndrome.  RTC 2 months with repeat chromogranin and routine labs. - Continue monthly Sandostatin.   2.  Normocytic anemia: - Hemoglobin is 10.7 with MCV 90.  Continue Retacrit with hemodialysis.   3.  ESRD on HD: - Continue HD on Tuesday, Thursday and Saturday.   4.  High risk drug monitoring: - No mucositis or hand-foot skin reaction.  Mild leukopenia and thrombocytopenia are stable.   Orders placed this encounter:  No orders of the defined types were placed in this encounter.    Derek Jack, MD [place] [phone]   I, Thana Ates, am acting as a scribe for Dr. Derek Jack.  I, Derek Jack MD, have reviewed the above documentation for accuracy and completeness, and I agree  with the above.

## 2021-07-20 ENCOUNTER — Other Ambulatory Visit: Payer: Self-pay

## 2021-07-20 ENCOUNTER — Inpatient Hospital Stay (HOSPITAL_COMMUNITY): Payer: Medicare Other

## 2021-07-20 ENCOUNTER — Ambulatory Visit (HOSPITAL_COMMUNITY): Payer: Medicare Other | Admitting: Hematology

## 2021-07-20 ENCOUNTER — Other Ambulatory Visit (HOSPITAL_COMMUNITY): Payer: Medicare Other

## 2021-07-20 ENCOUNTER — Inpatient Hospital Stay (HOSPITAL_COMMUNITY): Payer: Medicare Other | Attending: Hematology

## 2021-07-20 ENCOUNTER — Inpatient Hospital Stay (HOSPITAL_BASED_OUTPATIENT_CLINIC_OR_DEPARTMENT_OTHER): Payer: Medicare Other | Admitting: Hematology

## 2021-07-20 ENCOUNTER — Ambulatory Visit (HOSPITAL_COMMUNITY): Payer: Medicare Other

## 2021-07-20 VITALS — BP 108/65 | HR 84 | Temp 96.9°F | Resp 17 | Ht 77.0 in | Wt 183.2 lb

## 2021-07-20 DIAGNOSIS — C7A019 Malignant carcinoid tumor of the small intestine, unspecified portion: Secondary | ICD-10-CM

## 2021-07-20 DIAGNOSIS — C179 Malignant neoplasm of small intestine, unspecified: Secondary | ICD-10-CM

## 2021-07-20 DIAGNOSIS — C7B02 Secondary carcinoid tumors of liver: Secondary | ICD-10-CM | POA: Diagnosis not present

## 2021-07-20 DIAGNOSIS — C7A00 Malignant carcinoid tumor of unspecified site: Secondary | ICD-10-CM

## 2021-07-20 LAB — CBC WITH DIFFERENTIAL/PLATELET
Abs Immature Granulocytes: 0 10*3/uL (ref 0.00–0.07)
Basophils Absolute: 0 10*3/uL (ref 0.0–0.1)
Basophils Relative: 1 %
Eosinophils Absolute: 0 10*3/uL (ref 0.0–0.5)
Eosinophils Relative: 0 %
HCT: 32.6 % — ABNORMAL LOW (ref 39.0–52.0)
Hemoglobin: 10.7 g/dL — ABNORMAL LOW (ref 13.0–17.0)
Immature Granulocytes: 0 %
Lymphocytes Relative: 10 %
Lymphs Abs: 0.4 10*3/uL — ABNORMAL LOW (ref 0.7–4.0)
MCH: 29.6 pg (ref 26.0–34.0)
MCHC: 32.8 g/dL (ref 30.0–36.0)
MCV: 90.1 fL (ref 80.0–100.0)
Monocytes Absolute: 0.3 10*3/uL (ref 0.1–1.0)
Monocytes Relative: 9 %
Neutro Abs: 2.8 10*3/uL (ref 1.7–7.7)
Neutrophils Relative %: 80 %
Platelets: 112 10*3/uL — ABNORMAL LOW (ref 150–400)
RBC: 3.62 MIL/uL — ABNORMAL LOW (ref 4.22–5.81)
RDW: 13.5 % (ref 11.5–15.5)
WBC: 3.5 10*3/uL — ABNORMAL LOW (ref 4.0–10.5)
nRBC: 0 % (ref 0.0–0.2)

## 2021-07-20 LAB — COMPREHENSIVE METABOLIC PANEL
ALT: 16 U/L (ref 0–44)
AST: 26 U/L (ref 15–41)
Albumin: 3.3 g/dL — ABNORMAL LOW (ref 3.5–5.0)
Alkaline Phosphatase: 73 U/L (ref 38–126)
Anion gap: 9 (ref 5–15)
BUN: 42 mg/dL — ABNORMAL HIGH (ref 8–23)
CO2: 28 mmol/L (ref 22–32)
Calcium: 8.5 mg/dL — ABNORMAL LOW (ref 8.9–10.3)
Chloride: 99 mmol/L (ref 98–111)
Creatinine, Ser: 6.65 mg/dL — ABNORMAL HIGH (ref 0.61–1.24)
GFR, Estimated: 8 mL/min — ABNORMAL LOW (ref >60)
Glucose, Bld: 146 mg/dL — ABNORMAL HIGH (ref 70–99)
Potassium: 3.5 mmol/L (ref 3.5–5.1)
Sodium: 136 mmol/L (ref 135–145)
Total Bilirubin: 0.4 mg/dL (ref 0.3–1.2)
Total Protein: 6.1 g/dL — ABNORMAL LOW (ref 6.5–8.1)

## 2021-07-20 LAB — LACTATE DEHYDROGENASE: LDH: 153 U/L (ref 98–192)

## 2021-07-20 MED ORDER — OCTREOTIDE ACETATE 30 MG IM KIT
30.0000 mg | PACK | Freq: Once | INTRAMUSCULAR | Status: AC
  Administered 2021-07-20: 30 mg via INTRAMUSCULAR
  Filled 2021-07-20: qty 1

## 2021-07-20 NOTE — Patient Instructions (Signed)
[facility]  Discharge Instructions: Thank you for choosing [facility] to provide your oncology and hematology care.  If you have a lab appointment with the Cancer Center, please come in thru the Main Entrance and check in at the main information desk.  Wear comfortable clothing and clothing appropriate for easy access to any Portacath or PICC line.   We strive to give you quality time with your provider. You may need to reschedule your appointment if you arrive late (15 or more minutes).  Arriving late affects you and other patients whose appointments are after yours.  Also, if you miss three or more appointments without notifying the office, you may be dismissed from the clinic at the provider's discretion.      For prescription refill requests, have your pharmacy contact our office and allow 72 hours for refills to be completed.    Today you received the following chemotherapy and/or immunotherapy agents Sandostatin      To help prevent nausea and vomiting after your treatment, we encourage you to take your nausea medication as directed.  BELOW ARE SYMPTOMS THAT SHOULD BE REPORTED IMMEDIATELY: *FEVER GREATER THAN 100.4 F (38 C) OR HIGHER *CHILLS OR SWEATING *NAUSEA AND VOMITING THAT IS NOT CONTROLLED WITH YOUR NAUSEA MEDICATION *UNUSUAL SHORTNESS OF BREATH *UNUSUAL BRUISING OR BLEEDING *URINARY PROBLEMS (pain or burning when urinating, or frequent urination) *BOWEL PROBLEMS (unusual diarrhea, constipation, pain near the anus) TENDERNESS IN MOUTH AND THROAT WITH OR WITHOUT PRESENCE OF ULCERS (sore throat, sores in mouth, or a toothache) UNUSUAL RASH, SWELLING OR PAIN  UNUSUAL VAGINAL DISCHARGE OR ITCHING   Items with * indicate a potential emergency and should be followed up as soon as possible or go to the Emergency Department if any problems should occur.  Please show the CHEMOTHERAPY ALERT CARD or IMMUNOTHERAPY ALERT CARD at check-in to the Emergency  Department and triage nurse.  Should you have questions after your visit or need to cancel or reschedule your appointment, please contact [facility] [phone]  and follow the prompts.  Office hours are 8:00 a.m. to 4:30 p.m. Monday - Friday. Please note that voicemails left after 4:00 p.m. may not be returned until the following business day.  We are closed weekends and major holidays. You have access to a nurse at all times for urgent questions. Please call the main number to the clinic [phone] and follow the prompts.  For any non-urgent questions, you may also contact your provider using MyChart. We now offer e-Visits for anyone 18 and older to request care online for non-urgent symptoms. For details visit [email or web address].   Also download the MyChart app! Go to the app store, search "MyChart", open the app, select [facility], and log in with your MyChart username and password.  Due to Covid, a mask is required upon entering the hospital/clinic. If you do not have a mask, one will be given to you upon arrival. For doctor visits, patients may have 1 support person aged 18 or older with them. For treatment visits, patients cannot have anyone with them due to current Covid guidelines and our immunocompromised population.  

## 2021-07-20 NOTE — Progress Notes (Signed)
Allen Davenport presents today for Sandostatin injection per the provider's orders.  Stable during administration without incident; injection site WNL; see MAR for injection details.  Patient tolerated procedure well and without incident.  No questions or complaints noted at this time.  Discharge from clinic ambulatory in stable condition.  Alert and oriented X 3.  Follow up with [facility] as scheduled.

## 2021-07-20 NOTE — Patient Instructions (Addendum)
[place] at [facility] Discharge Instructions   You were seen and examined today by Dr. Delton Coombes. He reviewed your lab work results which were stable. He reviewed the results of your PET scan.  There is one spot on there that was brighter.  We will just watch it for now and continue with the same treatment. Continue Afinitor as prescribed.  Return every 4 weeks for Sandostatin injections. Return as scheduled in 2 months for office visit.    Thank you for choosing [place] at [facility] to provide your oncology and hematology care.  To afford each patient quality time with our provider, please arrive at least 15 minutes before your scheduled appointment time.   If you have a lab appointment with the [facility] please come in thru the Main Entrance and check in at the main information desk.  You need to re-schedule your appointment should you arrive 10 or more minutes late.  We strive to give you quality time with our providers, and arriving late affects you and other patients whose appointments are after yours.  Also, if you no show three or more times for appointments you may be dismissed from the clinic at the providers discretion.     Again, thank you for choosing [facility].  Our hope is that these requests will decrease the amount of time that you wait before being seen by our physicians.       _____________________________________________________________  Should you have questions after your visit to [facility], please contact our office at [phone] and follow the prompts.  Our office hours are 8:00 a.m. and 4:30 p.m. Monday - Friday.  Please note that voicemails left after 4:00 p.m. may not be returned until the following business day.  We are closed weekends and major holidays.  You do have access to a nurse 24-7, just call the main number to the clinic [phone] and do not press any options, hold  on the line and a nurse will answer the phone.    For prescription refill requests, have your pharmacy contact our office and allow 72 hours.    Due to Covid, you will need to wear a mask upon entering the hospital. If you do not have a mask, a mask will be given to you at the Main Entrance upon arrival. For doctor visits, patients may have 1 support person age 57 or older with them. For treatment visits, patients can not have anyone with them due to social distancing guidelines and our immunocompromised population.

## 2021-07-20 NOTE — Progress Notes (Signed)
Patient is taking Afinitor as prescribed.  He has not missed any doses and reports no side effects at this time.

## 2021-07-21 DIAGNOSIS — N186 End stage renal disease: Secondary | ICD-10-CM | POA: Diagnosis not present

## 2021-07-21 DIAGNOSIS — D509 Iron deficiency anemia, unspecified: Secondary | ICD-10-CM | POA: Diagnosis not present

## 2021-07-21 DIAGNOSIS — D631 Anemia in chronic kidney disease: Secondary | ICD-10-CM | POA: Diagnosis not present

## 2021-07-21 DIAGNOSIS — N2581 Secondary hyperparathyroidism of renal origin: Secondary | ICD-10-CM | POA: Diagnosis not present

## 2021-07-21 DIAGNOSIS — Z992 Dependence on renal dialysis: Secondary | ICD-10-CM | POA: Diagnosis not present

## 2021-07-21 LAB — CHROMOGRANIN A: Chromogranin A (ng/mL): 313 ng/mL — ABNORMAL HIGH (ref 0.0–101.8)

## 2021-07-23 DIAGNOSIS — D631 Anemia in chronic kidney disease: Secondary | ICD-10-CM | POA: Diagnosis not present

## 2021-07-23 DIAGNOSIS — D509 Iron deficiency anemia, unspecified: Secondary | ICD-10-CM | POA: Diagnosis not present

## 2021-07-23 DIAGNOSIS — N2581 Secondary hyperparathyroidism of renal origin: Secondary | ICD-10-CM | POA: Diagnosis not present

## 2021-07-23 DIAGNOSIS — N186 End stage renal disease: Secondary | ICD-10-CM | POA: Diagnosis not present

## 2021-07-23 DIAGNOSIS — Z992 Dependence on renal dialysis: Secondary | ICD-10-CM | POA: Diagnosis not present

## 2021-07-26 DIAGNOSIS — D509 Iron deficiency anemia, unspecified: Secondary | ICD-10-CM | POA: Diagnosis not present

## 2021-07-26 DIAGNOSIS — D631 Anemia in chronic kidney disease: Secondary | ICD-10-CM | POA: Diagnosis not present

## 2021-07-26 DIAGNOSIS — Z992 Dependence on renal dialysis: Secondary | ICD-10-CM | POA: Diagnosis not present

## 2021-07-26 DIAGNOSIS — N186 End stage renal disease: Secondary | ICD-10-CM | POA: Diagnosis not present

## 2021-07-26 DIAGNOSIS — N2581 Secondary hyperparathyroidism of renal origin: Secondary | ICD-10-CM | POA: Diagnosis not present

## 2021-07-28 DIAGNOSIS — Z992 Dependence on renal dialysis: Secondary | ICD-10-CM | POA: Diagnosis not present

## 2021-07-28 DIAGNOSIS — D509 Iron deficiency anemia, unspecified: Secondary | ICD-10-CM | POA: Diagnosis not present

## 2021-07-28 DIAGNOSIS — D631 Anemia in chronic kidney disease: Secondary | ICD-10-CM | POA: Diagnosis not present

## 2021-07-28 DIAGNOSIS — N186 End stage renal disease: Secondary | ICD-10-CM | POA: Diagnosis not present

## 2021-07-28 DIAGNOSIS — N2581 Secondary hyperparathyroidism of renal origin: Secondary | ICD-10-CM | POA: Diagnosis not present

## 2021-07-28 NOTE — Telephone Encounter (Signed)
Oral Oncology Patient Advocate Encounter  Received notification from [employer] that patient has been successfully enrolled into their program to receive Afinitor from the manufacturer at $0 out of pocket until 07/16/22.    I called and spoke with patient.  He knows we will have to re-apply.   Specialty Pharmacy that will dispense medication is [facility].  Patient knows to call the office with questions or concerns.   Oral Oncology Clinic will continue to follow.  [place] Patient [place] Phone [phone] Fax [phone] 07/28/2021 3:07 PM

## 2021-07-28 NOTE — Telephone Encounter (Signed)
Oral Oncology Patient Advocate Encounter  Received communication from [employer] that the patient's eligibility in the patient assistance program for Afinitor was due for re-enrollment.    The renewal application has been completed and faxed in an effort to keep the patient's out of pocket expense for Afinitor at $0.     Application completed and faxed to [phone] on 06/03/21.    [employer] Patient Allen Davenport phone number for follow up is [phone].    This encounter will be updated until final determination.  [place] Patient [name] Phone [phone] Fax [phone]

## 2021-07-30 DIAGNOSIS — Z992 Dependence on renal dialysis: Secondary | ICD-10-CM | POA: Diagnosis not present

## 2021-07-30 DIAGNOSIS — N186 End stage renal disease: Secondary | ICD-10-CM | POA: Diagnosis not present

## 2021-07-30 DIAGNOSIS — N2581 Secondary hyperparathyroidism of renal origin: Secondary | ICD-10-CM | POA: Diagnosis not present

## 2021-07-30 DIAGNOSIS — D509 Iron deficiency anemia, unspecified: Secondary | ICD-10-CM | POA: Diagnosis not present

## 2021-07-30 DIAGNOSIS — D631 Anemia in chronic kidney disease: Secondary | ICD-10-CM | POA: Diagnosis not present

## 2021-08-02 DIAGNOSIS — N186 End stage renal disease: Secondary | ICD-10-CM | POA: Diagnosis not present

## 2021-08-02 DIAGNOSIS — D631 Anemia in chronic kidney disease: Secondary | ICD-10-CM | POA: Diagnosis not present

## 2021-08-02 DIAGNOSIS — Z992 Dependence on renal dialysis: Secondary | ICD-10-CM | POA: Diagnosis not present

## 2021-08-02 DIAGNOSIS — N2581 Secondary hyperparathyroidism of renal origin: Secondary | ICD-10-CM | POA: Diagnosis not present

## 2021-08-02 DIAGNOSIS — D509 Iron deficiency anemia, unspecified: Secondary | ICD-10-CM | POA: Diagnosis not present

## 2021-08-04 DIAGNOSIS — N2581 Secondary hyperparathyroidism of renal origin: Secondary | ICD-10-CM | POA: Diagnosis not present

## 2021-08-04 DIAGNOSIS — D509 Iron deficiency anemia, unspecified: Secondary | ICD-10-CM | POA: Diagnosis not present

## 2021-08-04 DIAGNOSIS — Z992 Dependence on renal dialysis: Secondary | ICD-10-CM | POA: Diagnosis not present

## 2021-08-04 DIAGNOSIS — D631 Anemia in chronic kidney disease: Secondary | ICD-10-CM | POA: Diagnosis not present

## 2021-08-04 DIAGNOSIS — N186 End stage renal disease: Secondary | ICD-10-CM | POA: Diagnosis not present

## 2021-08-06 DIAGNOSIS — N186 End stage renal disease: Secondary | ICD-10-CM | POA: Diagnosis not present

## 2021-08-06 DIAGNOSIS — D509 Iron deficiency anemia, unspecified: Secondary | ICD-10-CM | POA: Diagnosis not present

## 2021-08-06 DIAGNOSIS — Z992 Dependence on renal dialysis: Secondary | ICD-10-CM | POA: Diagnosis not present

## 2021-08-06 DIAGNOSIS — D631 Anemia in chronic kidney disease: Secondary | ICD-10-CM | POA: Diagnosis not present

## 2021-08-06 DIAGNOSIS — N2581 Secondary hyperparathyroidism of renal origin: Secondary | ICD-10-CM | POA: Diagnosis not present

## 2021-08-09 DIAGNOSIS — N2581 Secondary hyperparathyroidism of renal origin: Secondary | ICD-10-CM | POA: Diagnosis not present

## 2021-08-09 DIAGNOSIS — D509 Iron deficiency anemia, unspecified: Secondary | ICD-10-CM | POA: Diagnosis not present

## 2021-08-09 DIAGNOSIS — N186 End stage renal disease: Secondary | ICD-10-CM | POA: Diagnosis not present

## 2021-08-09 DIAGNOSIS — D631 Anemia in chronic kidney disease: Secondary | ICD-10-CM | POA: Diagnosis not present

## 2021-08-09 DIAGNOSIS — Z992 Dependence on renal dialysis: Secondary | ICD-10-CM | POA: Diagnosis not present

## 2021-08-11 DIAGNOSIS — N2581 Secondary hyperparathyroidism of renal origin: Secondary | ICD-10-CM | POA: Diagnosis not present

## 2021-08-11 DIAGNOSIS — N186 End stage renal disease: Secondary | ICD-10-CM | POA: Diagnosis not present

## 2021-08-11 DIAGNOSIS — D631 Anemia in chronic kidney disease: Secondary | ICD-10-CM | POA: Diagnosis not present

## 2021-08-11 DIAGNOSIS — D509 Iron deficiency anemia, unspecified: Secondary | ICD-10-CM | POA: Diagnosis not present

## 2021-08-11 DIAGNOSIS — Z992 Dependence on renal dialysis: Secondary | ICD-10-CM | POA: Diagnosis not present

## 2021-08-13 DIAGNOSIS — N2581 Secondary hyperparathyroidism of renal origin: Secondary | ICD-10-CM | POA: Diagnosis not present

## 2021-08-13 DIAGNOSIS — D509 Iron deficiency anemia, unspecified: Secondary | ICD-10-CM | POA: Diagnosis not present

## 2021-08-13 DIAGNOSIS — D631 Anemia in chronic kidney disease: Secondary | ICD-10-CM | POA: Diagnosis not present

## 2021-08-13 DIAGNOSIS — Z992 Dependence on renal dialysis: Secondary | ICD-10-CM | POA: Diagnosis not present

## 2021-08-13 DIAGNOSIS — N186 End stage renal disease: Secondary | ICD-10-CM | POA: Diagnosis not present

## 2021-08-15 DIAGNOSIS — E1140 Type 2 diabetes mellitus with diabetic neuropathy, unspecified: Secondary | ICD-10-CM | POA: Diagnosis not present

## 2021-08-15 DIAGNOSIS — E1151 Type 2 diabetes mellitus with diabetic peripheral angiopathy without gangrene: Secondary | ICD-10-CM | POA: Diagnosis not present

## 2021-08-16 DIAGNOSIS — Z992 Dependence on renal dialysis: Secondary | ICD-10-CM | POA: Diagnosis not present

## 2021-08-16 DIAGNOSIS — D631 Anemia in chronic kidney disease: Secondary | ICD-10-CM | POA: Diagnosis not present

## 2021-08-16 DIAGNOSIS — N2581 Secondary hyperparathyroidism of renal origin: Secondary | ICD-10-CM | POA: Diagnosis not present

## 2021-08-16 DIAGNOSIS — N186 End stage renal disease: Secondary | ICD-10-CM | POA: Diagnosis not present

## 2021-08-16 DIAGNOSIS — D509 Iron deficiency anemia, unspecified: Secondary | ICD-10-CM | POA: Diagnosis not present

## 2021-08-17 ENCOUNTER — Inpatient Hospital Stay (HOSPITAL_COMMUNITY): Payer: Medicare Other | Attending: Hematology

## 2021-08-17 VITALS — BP 115/74 | HR 62 | Temp 97.3°F | Resp 17 | Ht 77.0 in | Wt 183.1 lb

## 2021-08-17 DIAGNOSIS — C7B02 Secondary carcinoid tumors of liver: Secondary | ICD-10-CM | POA: Insufficient documentation

## 2021-08-17 DIAGNOSIS — C7A00 Malignant carcinoid tumor of unspecified site: Secondary | ICD-10-CM

## 2021-08-17 MED ORDER — OCTREOTIDE ACETATE 30 MG IM KIT
30.0000 mg | PACK | Freq: Once | INTRAMUSCULAR | Status: AC
  Administered 2021-08-17: 30 mg via INTRAMUSCULAR

## 2021-08-17 NOTE — Patient Instructions (Signed)
[place]  Discharge Instructions: Thank you for choosing [facility] to provide your oncology and hematology care.  If you have a lab appointment with the [facility], please come in thru the Main Entrance and check in at the main information desk.  Wear comfortable clothing and clothing appropriate for easy access to any Portacath or PICC line.   We strive to give you quality time with your provider. You may need to reschedule your appointment if you arrive late (15 or more minutes).  Arriving late affects you and other patients whose appointments are after yours.  Also, if you miss three or more appointments without notifying the office, you may be dismissed from the clinic at the providers discretion.      For prescription refill requests, have your pharmacy contact our office and allow 72 hours for refills to be completed.    Today you received Sandostatin injection.    BELOW ARE SYMPTOMS THAT SHOULD BE REPORTED IMMEDIATELY: *FEVER GREATER THAN 100.4 F (38 C) OR HIGHER *CHILLS OR SWEATING *NAUSEA AND VOMITING THAT IS NOT CONTROLLED WITH YOUR NAUSEA MEDICATION *UNUSUAL SHORTNESS OF BREATH *UNUSUAL BRUISING OR BLEEDING *URINARY PROBLEMS (pain or burning when urinating, or frequent urination) *BOWEL PROBLEMS (unusual diarrhea, constipation, pain near the anus) TENDERNESS IN MOUTH AND THROAT WITH OR WITHOUT PRESENCE OF ULCERS (sore throat, sores in mouth, or a toothache) UNUSUAL RASH, SWELLING OR PAIN  UNUSUAL VAGINAL DISCHARGE OR ITCHING   Items with * indicate a potential emergency and should be followed up as soon as possible or go to the Emergency Department if any problems should occur.  Please show the CHEMOTHERAPY ALERT CARD or IMMUNOTHERAPY ALERT CARD at check-in to the Emergency Department and triage nurse.  Should you have questions after your visit or need to cancel or reschedule your appointment, please contact [facility]  [phone]  and follow the prompts.  Office hours are 8:00 a.m. to 4:30 p.m. Monday - Friday. Please note that voicemails left after 4:00 p.m. may not be returned until the following business day.  We are closed weekends and major holidays. You have access to a nurse at all times for urgent questions. Please call the main number to the clinic [phone] and follow the prompts.  For any non-urgent questions, you may also contact your provider using MyChart. We now offer e-Visits for anyone 16 and older to request care online for non-urgent symptoms. For details visit [email or web address].   Also download the MyChart app! Go to the app store, search "MyChart", open the app, select [place], and log in with your MyChart username and password.  Due to Covid, a mask is required upon entering the hospital/clinic. If you do not have a mask, one will be given to you upon arrival. For doctor visits, patients may have 1 support person aged 30 or older with them. For treatment visits, patients cannot have anyone with them due to current Covid guidelines and our immunocompromised population.

## 2021-08-17 NOTE — Progress Notes (Signed)
Allen Davenport presents today for injection per the provider's orders.  Sandostatin 30 mg administration without incident; injection site WNL; see MAR for injection details.  Patient tolerated procedure well and without incident.  No questions or complaints noted at this time.   Discharged from clinic ambulatory in stable condition. Alert and oriented x 3. F/U with [facility] as scheduled.

## 2021-08-18 DIAGNOSIS — Z992 Dependence on renal dialysis: Secondary | ICD-10-CM | POA: Diagnosis not present

## 2021-08-18 DIAGNOSIS — D631 Anemia in chronic kidney disease: Secondary | ICD-10-CM | POA: Diagnosis not present

## 2021-08-18 DIAGNOSIS — N2581 Secondary hyperparathyroidism of renal origin: Secondary | ICD-10-CM | POA: Diagnosis not present

## 2021-08-18 DIAGNOSIS — D509 Iron deficiency anemia, unspecified: Secondary | ICD-10-CM | POA: Diagnosis not present

## 2021-08-18 DIAGNOSIS — N186 End stage renal disease: Secondary | ICD-10-CM | POA: Diagnosis not present

## 2021-08-20 DIAGNOSIS — Z992 Dependence on renal dialysis: Secondary | ICD-10-CM | POA: Diagnosis not present

## 2021-08-20 DIAGNOSIS — D631 Anemia in chronic kidney disease: Secondary | ICD-10-CM | POA: Diagnosis not present

## 2021-08-20 DIAGNOSIS — N2581 Secondary hyperparathyroidism of renal origin: Secondary | ICD-10-CM | POA: Diagnosis not present

## 2021-08-20 DIAGNOSIS — N186 End stage renal disease: Secondary | ICD-10-CM | POA: Diagnosis not present

## 2021-08-20 DIAGNOSIS — D509 Iron deficiency anemia, unspecified: Secondary | ICD-10-CM | POA: Diagnosis not present

## 2021-08-23 DIAGNOSIS — N186 End stage renal disease: Secondary | ICD-10-CM | POA: Diagnosis not present

## 2021-08-23 DIAGNOSIS — N2581 Secondary hyperparathyroidism of renal origin: Secondary | ICD-10-CM | POA: Diagnosis not present

## 2021-08-23 DIAGNOSIS — Z992 Dependence on renal dialysis: Secondary | ICD-10-CM | POA: Diagnosis not present

## 2021-08-23 DIAGNOSIS — D509 Iron deficiency anemia, unspecified: Secondary | ICD-10-CM | POA: Diagnosis not present

## 2021-08-23 DIAGNOSIS — D631 Anemia in chronic kidney disease: Secondary | ICD-10-CM | POA: Diagnosis not present

## 2021-08-25 DIAGNOSIS — D631 Anemia in chronic kidney disease: Secondary | ICD-10-CM | POA: Diagnosis not present

## 2021-08-25 DIAGNOSIS — N2581 Secondary hyperparathyroidism of renal origin: Secondary | ICD-10-CM | POA: Diagnosis not present

## 2021-08-25 DIAGNOSIS — N186 End stage renal disease: Secondary | ICD-10-CM | POA: Diagnosis not present

## 2021-08-25 DIAGNOSIS — Z992 Dependence on renal dialysis: Secondary | ICD-10-CM | POA: Diagnosis not present

## 2021-08-25 DIAGNOSIS — D509 Iron deficiency anemia, unspecified: Secondary | ICD-10-CM | POA: Diagnosis not present

## 2021-08-27 DIAGNOSIS — Z992 Dependence on renal dialysis: Secondary | ICD-10-CM | POA: Diagnosis not present

## 2021-08-27 DIAGNOSIS — D631 Anemia in chronic kidney disease: Secondary | ICD-10-CM | POA: Diagnosis not present

## 2021-08-27 DIAGNOSIS — D509 Iron deficiency anemia, unspecified: Secondary | ICD-10-CM | POA: Diagnosis not present

## 2021-08-27 DIAGNOSIS — N2581 Secondary hyperparathyroidism of renal origin: Secondary | ICD-10-CM | POA: Diagnosis not present

## 2021-08-27 DIAGNOSIS — N186 End stage renal disease: Secondary | ICD-10-CM | POA: Diagnosis not present

## 2021-08-30 DIAGNOSIS — D509 Iron deficiency anemia, unspecified: Secondary | ICD-10-CM | POA: Diagnosis not present

## 2021-08-30 DIAGNOSIS — D631 Anemia in chronic kidney disease: Secondary | ICD-10-CM | POA: Diagnosis not present

## 2021-08-30 DIAGNOSIS — Z992 Dependence on renal dialysis: Secondary | ICD-10-CM | POA: Diagnosis not present

## 2021-08-30 DIAGNOSIS — N186 End stage renal disease: Secondary | ICD-10-CM | POA: Diagnosis not present

## 2021-08-30 DIAGNOSIS — N2581 Secondary hyperparathyroidism of renal origin: Secondary | ICD-10-CM | POA: Diagnosis not present

## 2021-09-01 DIAGNOSIS — Z992 Dependence on renal dialysis: Secondary | ICD-10-CM | POA: Diagnosis not present

## 2021-09-01 DIAGNOSIS — N186 End stage renal disease: Secondary | ICD-10-CM | POA: Diagnosis not present

## 2021-09-01 DIAGNOSIS — D509 Iron deficiency anemia, unspecified: Secondary | ICD-10-CM | POA: Diagnosis not present

## 2021-09-01 DIAGNOSIS — N2581 Secondary hyperparathyroidism of renal origin: Secondary | ICD-10-CM | POA: Diagnosis not present

## 2021-09-01 DIAGNOSIS — D631 Anemia in chronic kidney disease: Secondary | ICD-10-CM | POA: Diagnosis not present

## 2021-09-03 DIAGNOSIS — D631 Anemia in chronic kidney disease: Secondary | ICD-10-CM | POA: Diagnosis not present

## 2021-09-03 DIAGNOSIS — Z992 Dependence on renal dialysis: Secondary | ICD-10-CM | POA: Diagnosis not present

## 2021-09-03 DIAGNOSIS — N2581 Secondary hyperparathyroidism of renal origin: Secondary | ICD-10-CM | POA: Diagnosis not present

## 2021-09-03 DIAGNOSIS — N186 End stage renal disease: Secondary | ICD-10-CM | POA: Diagnosis not present

## 2021-09-03 DIAGNOSIS — D509 Iron deficiency anemia, unspecified: Secondary | ICD-10-CM | POA: Diagnosis not present

## 2021-09-06 DIAGNOSIS — N186 End stage renal disease: Secondary | ICD-10-CM | POA: Diagnosis not present

## 2021-09-06 DIAGNOSIS — N2581 Secondary hyperparathyroidism of renal origin: Secondary | ICD-10-CM | POA: Diagnosis not present

## 2021-09-06 DIAGNOSIS — Z992 Dependence on renal dialysis: Secondary | ICD-10-CM | POA: Diagnosis not present

## 2021-09-06 DIAGNOSIS — D509 Iron deficiency anemia, unspecified: Secondary | ICD-10-CM | POA: Diagnosis not present

## 2021-09-06 DIAGNOSIS — D631 Anemia in chronic kidney disease: Secondary | ICD-10-CM | POA: Diagnosis not present

## 2021-09-07 DIAGNOSIS — E7849 Other hyperlipidemia: Secondary | ICD-10-CM | POA: Diagnosis not present

## 2021-09-07 DIAGNOSIS — M818 Other osteoporosis without current pathological fracture: Secondary | ICD-10-CM | POA: Diagnosis not present

## 2021-09-07 DIAGNOSIS — Z1331 Encounter for screening for depression: Secondary | ICD-10-CM | POA: Diagnosis not present

## 2021-09-07 DIAGNOSIS — Z0000 Encounter for general adult medical examination without abnormal findings: Secondary | ICD-10-CM | POA: Diagnosis not present

## 2021-09-07 DIAGNOSIS — I10 Essential (primary) hypertension: Secondary | ICD-10-CM | POA: Diagnosis not present

## 2021-09-07 DIAGNOSIS — N185 Chronic kidney disease, stage 5: Secondary | ICD-10-CM | POA: Diagnosis not present

## 2021-09-08 DIAGNOSIS — Z992 Dependence on renal dialysis: Secondary | ICD-10-CM | POA: Diagnosis not present

## 2021-09-08 DIAGNOSIS — N2581 Secondary hyperparathyroidism of renal origin: Secondary | ICD-10-CM | POA: Diagnosis not present

## 2021-09-08 DIAGNOSIS — D631 Anemia in chronic kidney disease: Secondary | ICD-10-CM | POA: Diagnosis not present

## 2021-09-08 DIAGNOSIS — D509 Iron deficiency anemia, unspecified: Secondary | ICD-10-CM | POA: Diagnosis not present

## 2021-09-08 DIAGNOSIS — N186 End stage renal disease: Secondary | ICD-10-CM | POA: Diagnosis not present

## 2021-09-10 DIAGNOSIS — N2581 Secondary hyperparathyroidism of renal origin: Secondary | ICD-10-CM | POA: Diagnosis not present

## 2021-09-10 DIAGNOSIS — N186 End stage renal disease: Secondary | ICD-10-CM | POA: Diagnosis not present

## 2021-09-10 DIAGNOSIS — Z992 Dependence on renal dialysis: Secondary | ICD-10-CM | POA: Diagnosis not present

## 2021-09-10 DIAGNOSIS — D631 Anemia in chronic kidney disease: Secondary | ICD-10-CM | POA: Diagnosis not present

## 2021-09-10 DIAGNOSIS — D509 Iron deficiency anemia, unspecified: Secondary | ICD-10-CM | POA: Diagnosis not present

## 2021-09-12 ENCOUNTER — Inpatient Hospital Stay (HOSPITAL_COMMUNITY): Payer: Medicare Other

## 2021-09-12 ENCOUNTER — Other Ambulatory Visit: Payer: Self-pay

## 2021-09-12 DIAGNOSIS — C7A00 Malignant carcinoid tumor of unspecified site: Secondary | ICD-10-CM

## 2021-09-12 DIAGNOSIS — C179 Malignant neoplasm of small intestine, unspecified: Secondary | ICD-10-CM

## 2021-09-12 DIAGNOSIS — C7A019 Malignant carcinoid tumor of the small intestine, unspecified portion: Secondary | ICD-10-CM

## 2021-09-12 DIAGNOSIS — C7B02 Secondary carcinoid tumors of liver: Secondary | ICD-10-CM | POA: Diagnosis not present

## 2021-09-12 LAB — CBC WITH DIFFERENTIAL/PLATELET
Abs Immature Granulocytes: 0.01 10*3/uL (ref 0.00–0.07)
Basophils Absolute: 0 10*3/uL (ref 0.0–0.1)
Basophils Relative: 0 %
Eosinophils Absolute: 0.1 10*3/uL (ref 0.0–0.5)
Eosinophils Relative: 1 %
HCT: 34.1 % — ABNORMAL LOW (ref 39.0–52.0)
Hemoglobin: 10.9 g/dL — ABNORMAL LOW (ref 13.0–17.0)
Immature Granulocytes: 0 %
Lymphocytes Relative: 10 %
Lymphs Abs: 0.4 10*3/uL — ABNORMAL LOW (ref 0.7–4.0)
MCH: 28.5 pg (ref 26.0–34.0)
MCHC: 32 g/dL (ref 30.0–36.0)
MCV: 89.3 fL (ref 80.0–100.0)
Monocytes Absolute: 0.4 10*3/uL (ref 0.1–1.0)
Monocytes Relative: 11 %
Neutro Abs: 2.8 10*3/uL (ref 1.7–7.7)
Neutrophils Relative %: 78 %
Platelets: 103 10*3/uL — ABNORMAL LOW (ref 150–400)
RBC: 3.82 MIL/uL — ABNORMAL LOW (ref 4.22–5.81)
RDW: 14.3 % (ref 11.5–15.5)
WBC: 3.6 10*3/uL — ABNORMAL LOW (ref 4.0–10.5)
nRBC: 0 % (ref 0.0–0.2)

## 2021-09-12 LAB — COMPREHENSIVE METABOLIC PANEL
ALT: 17 U/L (ref 0–44)
AST: 29 U/L (ref 15–41)
Albumin: 3.4 g/dL — ABNORMAL LOW (ref 3.5–5.0)
Alkaline Phosphatase: 80 U/L (ref 38–126)
Anion gap: 6 (ref 5–15)
BUN: 54 mg/dL — ABNORMAL HIGH (ref 8–23)
CO2: 29 mmol/L (ref 22–32)
Calcium: 8.4 mg/dL — ABNORMAL LOW (ref 8.9–10.3)
Chloride: 98 mmol/L (ref 98–111)
Creatinine, Ser: 8.11 mg/dL — ABNORMAL HIGH (ref 0.61–1.24)
GFR, Estimated: 6 mL/min — ABNORMAL LOW (ref >60)
Glucose, Bld: 86 mg/dL (ref 70–99)
Potassium: 3.9 mmol/L (ref 3.5–5.1)
Sodium: 133 mmol/L — ABNORMAL LOW (ref 135–145)
Total Bilirubin: 0.6 mg/dL (ref 0.3–1.2)
Total Protein: 6 g/dL — ABNORMAL LOW (ref 6.5–8.1)

## 2021-09-12 LAB — LACTATE DEHYDROGENASE: LDH: 163 U/L (ref 98–192)

## 2021-09-13 DIAGNOSIS — M818 Other osteoporosis without current pathological fracture: Secondary | ICD-10-CM | POA: Diagnosis not present

## 2021-09-13 DIAGNOSIS — D631 Anemia in chronic kidney disease: Secondary | ICD-10-CM | POA: Diagnosis not present

## 2021-09-13 DIAGNOSIS — E7849 Other hyperlipidemia: Secondary | ICD-10-CM | POA: Diagnosis not present

## 2021-09-13 DIAGNOSIS — Z992 Dependence on renal dialysis: Secondary | ICD-10-CM | POA: Diagnosis not present

## 2021-09-13 DIAGNOSIS — N185 Chronic kidney disease, stage 5: Secondary | ICD-10-CM | POA: Diagnosis not present

## 2021-09-13 DIAGNOSIS — N2581 Secondary hyperparathyroidism of renal origin: Secondary | ICD-10-CM | POA: Diagnosis not present

## 2021-09-13 DIAGNOSIS — N186 End stage renal disease: Secondary | ICD-10-CM | POA: Diagnosis not present

## 2021-09-13 DIAGNOSIS — D509 Iron deficiency anemia, unspecified: Secondary | ICD-10-CM | POA: Diagnosis not present

## 2021-09-13 DIAGNOSIS — I10 Essential (primary) hypertension: Secondary | ICD-10-CM | POA: Diagnosis not present

## 2021-09-13 LAB — CHROMOGRANIN A: Chromogranin A (ng/mL): 887.9 ng/mL — ABNORMAL HIGH (ref 0.0–101.8)

## 2021-09-14 ENCOUNTER — Other Ambulatory Visit: Payer: Self-pay

## 2021-09-14 ENCOUNTER — Inpatient Hospital Stay (HOSPITAL_COMMUNITY): Payer: Medicare Other

## 2021-09-14 ENCOUNTER — Inpatient Hospital Stay (HOSPITAL_COMMUNITY): Payer: Medicare Other | Attending: Hematology | Admitting: Hematology

## 2021-09-14 VITALS — BP 104/69 | HR 64 | Temp 98.1°F | Resp 18 | Ht 77.0 in | Wt 186.0 lb

## 2021-09-14 DIAGNOSIS — C7A00 Malignant carcinoid tumor of unspecified site: Secondary | ICD-10-CM | POA: Insufficient documentation

## 2021-09-14 DIAGNOSIS — C7B02 Secondary carcinoid tumors of liver: Secondary | ICD-10-CM | POA: Insufficient documentation

## 2021-09-14 MED ORDER — OCTREOTIDE ACETATE 30 MG IM KIT
30.0000 mg | PACK | Freq: Once | INTRAMUSCULAR | Status: AC
  Administered 2021-09-14: 30 mg via INTRAMUSCULAR
  Filled 2021-09-14: qty 1

## 2021-09-14 NOTE — Progress Notes (Signed)
Patient has been assessed, vital signs and labs have been reviewed by Dr. Katragadda. ANC, Creatinine, LFTs, and Platelets are within treatment parameters per Dr. Katragadda. The patient is good to proceed with treatment at this time. Primary RN and pharmacy aware.  

## 2021-09-14 NOTE — Progress Notes (Signed)
Allen Davenport, [name]   CLINIC:  Medical Oncology/Hematology  PCP:  [name], MD [place] / [place] [address] [phone]   REASON FOR VISIT:  Follow-up for metastatic carcinoid tumor of small intestine  PRIOR THERAPY: none  NGS Results: not done  CURRENT THERAPY: Everolimus daily & Sandostatin monthly  BRIEF ONCOLOGIC HISTORY:  Oncology History   No history exists.    CANCER STAGING:  Cancer Staging  No matching staging information was found for the patient.  INTERVAL HISTORY:  Allen Davenport, a 81 y.o. male, returns for routine follow-up of his metastatic carcinoid tumor of small intestine. Jaxx was last seen on 07/20/2021.   Today he reports feeling good. He denies diarrhea, skin breakdown, abdominal pain, and mouth sores.   REVIEW OF SYSTEMS:  Review of Systems  Constitutional:  Negative for appetite change and fatigue.  HENT:   Negative for mouth sores.   Gastrointestinal:  Negative for abdominal pain and diarrhea.  All other systems reviewed and are negative.  PAST MEDICAL/SURGICAL HISTORY:  Past Medical History:  Diagnosis Date   Anemia    Cancer ([facility])    right renal . Prostate- Radiation treatment   CHF (congestive heart failure) (HCC)    Chronic kidney disease    Dialysis T/TH/Sa   Edema    Heart murmur    "nothing to worry about"   Hyperlipidemia    Hypertension    Malignant carcinoid tumor ([facility]) 01/03/2016   Pneumonia    as a child   Past Surgical History:  Procedure Laterality Date   AV FISTULA PLACEMENT Left 10/20/2014   Procedure: Left Arm ARTERIOVENOUS (AV) FISTULA CREATION;  Surgeon: Elam Dutch, MD;  Location: [facility];  Service: Vascular;  Laterality: Left;   NEPHRECTOMY Right 2010   PERIPHERAL VASCULAR BALLOON ANGIOPLASTY  06/20/2019   Procedure: PERIPHERAL VASCULAR BALLOON ANGIOPLASTY;  Surgeon: Elam Dutch, MD;  Location: [facility];  Service:  Cardiovascular;;   REVISON OF ARTERIOVENOUS FISTULA Left 08/04/2019   Procedure: REVISON OF ARTERIOVENOUS FISTULA WITH SIDE BRANCH LIGATION;  Surgeon: Elam Dutch, MD;  Location: [facility];  Service: Vascular;  Laterality: Left;    SOCIAL HISTORY:  Social History   Socioeconomic History   Marital status: Married    Spouse name: Not on file   Number of children: Not on file   Years of education: Not on file   Highest education level: Not on file  Occupational History   Not on file  Tobacco Use   Smoking status: Never   Smokeless tobacco: Never  Vaping Use   Vaping Use: Never used  Substance and Sexual Activity   Alcohol use: No    Alcohol/week: 0.0 standard drinks   Drug use: No   Sexual activity: Not on file  Other Topics Concern   Not on file  Social History Narrative   Not on file   Social Determinants of Health   Financial Resource Strain: Not on file  Food Insecurity: Not on file  Transportation Needs: Not on file  Physical Activity: Not on file  Stress: Not on file  Social Connections: Not on file  Intimate Partner Violence: Not on file    FAMILY HISTORY:  Family History  Problem Relation Age of Onset   Cancer Mother    Hypertension Father    Cancer Sister     CURRENT MEDICATIONS:  Current Outpatient Medications  Medication  Sig Dispense Refill   atorvastatin (LIPITOR) 80 MG tablet Take 80 mg by mouth daily at 6 PM.      calcium acetate (PHOSLO) 667 MG capsule Take by mouth.     carvedilol (COREG) 6.25 MG tablet Take 6.25 mg by mouth 2 (two) times daily.     everolimus (AFINITOR) 5 MG tablet Take 1 tablet (5 mg total) by mouth daily. 28 tablet 11   furosemide (LASIX) 40 MG tablet Take 40 mg by mouth every morning.     octreotide (SANDOSTATIN LAR) 30 MG injection Inject 30 mg into the muscle every 28 (twenty-eight) days.      Omega-3 Fatty Acids (FISH OIL) 1000 MG CAPS Take 1,000 mg by mouth daily.      pyridOXINE (B-6) 50 MG tablet Take 1 tablet (50 mg  total) by mouth daily. 30 tablet 8   sodium bicarbonate 650 MG tablet Take 650 mg by mouth daily.     No current facility-administered medications for this visit.    ALLERGIES:  No Known Allergies  PHYSICAL EXAM:  Performance status (ECOG): 1 - Symptomatic but completely ambulatory  Vitals:   09/14/21 1416  BP: 104/69  Pulse: 64  Resp: 18  Temp: 98.1 F (36.7 C)  SpO2: 97%   Wt Readings from Last 3 Encounters:  09/14/21 186 lb (84.4 kg)  08/17/21 183 lb 1.6 oz (83.1 kg)  07/20/21 183 lb 3.2 oz (83.1 kg)   Physical Exam Vitals reviewed.  Constitutional:      Appearance: Normal appearance.  Cardiovascular:     Rate and Rhythm: Normal rate and regular rhythm.     Pulses: Normal pulses.     Heart sounds: Normal heart sounds.  Pulmonary:     Effort: Pulmonary effort is normal.     Breath sounds: Normal breath sounds.  Abdominal:     Palpations: Abdomen is soft. There is no hepatomegaly, splenomegaly or mass.     Tenderness: There is no abdominal tenderness.  Neurological:     General: No focal deficit present.     Mental Status: He is alert and oriented to person, place, and time.  Psychiatric:        Mood and Affect: Mood normal.        Behavior: Behavior normal.     LABORATORY DATA:  I have reviewed the labs as listed.  CBC Latest Ref Rng & Units 09/12/2021 07/20/2021 06/22/2021  WBC 4.0 - 10.5 K/uL 3.6(L) 3.5(L) 4.1  Hemoglobin 13.0 - 17.0 g/dL 10.9(L) 10.7(L) 10.3(L)  Hematocrit 39.0 - 52.0 % 34.1(L) 32.6(L) 32.0(L)  Platelets 150 - 400 K/uL 103(L) 112(L) 129(L)   CMP Latest Ref Rng & Units 09/12/2021 07/20/2021 06/22/2021  Glucose 70 - 99 mg/dL 86 146(H) 74  BUN 8 - 23 mg/dL 54(H) 42(H) 44(H)  Creatinine 0.61 - 1.24 mg/dL 8.11(H) 6.65(H) 7.21(H)  Sodium 135 - 145 mmol/L 133(L) 136 137  Potassium 3.5 - 5.1 mmol/L 3.9 3.5 3.7  Chloride 98 - 111 mmol/L 98 99 98  CO2 22 - 32 mmol/L 29 28 29   Calcium 8.9 - 10.3 mg/dL 8.4(L) 8.5(L) 8.7(L)  Total Protein 6.5 - 8.1  g/dL 6.0(L) 6.1(L) 6.2(L)  Total Bilirubin 0.3 - 1.2 mg/dL 0.6 0.4 0.5  Alkaline Phos 38 - 126 U/L 80 73 75  AST 15 - 41 U/L 29 26 32  ALT 0 - 44 U/L 17 16 21     DIAGNOSTIC IMAGING:  I have independently reviewed the scans and discussed with the patient.  No results found.   ASSESSMENT:  1.  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  Everolimus 5 mg daily started on 05/17/2018. -PET scan on 10/29/2019 showed multifocal well-differentiated neuroendocrine tumor with intense avid lesions in the liver, mesentery, peritoneal space.  No clear increase in size of the lesions.  Many lesions have increased in radiotracer activity. -PET scan on 09/08/2020 showed no change in multifocal neuroendocrine tumor hepatic metastasis with no changes in multifocal mesenteric and peritoneal nodule or metastatic tumors.  Resolution of the right lung base pulmonary nodule.  No evidence of new metastatic disease.   2.  Normocytic anemia: -This is from combination of end-stage renal disease and relative iron deficiency and myelosuppression from everolimus.   3.  ESRD on HD: -Started on HD on 05/20/2019   PLAN:  1.  Malignant carcinoid tumor to the liver and peritoneum: - PET scan on 06/20/2021 shows stable findings since 08/19/2020, with persistent metastasis in the liver and peritoneal space with no new lesions.  1 lesion in the central left hepatic lobe has increased slightly in size and activity. - He is tolerating everolimus 5 mg daily without any major problems. - Reviewed labs today which shows normal LFTs.  CBC shows mild leukopenia and thrombocytopenia from everolimus. - Chromogranin level is elevated at 887.  Previously it was stable between 300-700. - It is hard to interpret chromogranin and dialysis patients. - I have recommended follow-up in 3 months with a repeat [facility] PET CT scan and chromogranin level. - We will continue monthly octreotide,  which he is tolerating well.   2.  Normocytic anemia: - Hemoglobin is 10.9 with MCV 89.  Continue Retacrit with hemodialysis.   3.  ESRD on HD: - Continue HD on Tuesday, Thursday and Saturday.   4.  High risk drug monitoring: - No mucositis or HFSR.   Orders placed this encounter:  No orders of the defined types were placed in this encounter.    Derek Jack, MD [place] [phone]   I, Thana Ates, am acting as a scribe for Dr. Derek Jack.  I, Derek Jack MD, have reviewed the above documentation for accuracy and completeness, and I agree with the above.

## 2021-09-14 NOTE — Patient Instructions (Addendum)
[place] at [facility] ?Discharge Instructions ? ?You were seen and examined today by Dr. Delton Coombes. He reviewed your most recent labs and your blood counts are stable, your Chromogranin (cancer marker) has gone up. With dialysis things like your Chromogranin can vary so we will keep a check on your labs.  Please keep follow up appointment as scheduled. ? ? ?Thank you for choosing [place] at [facility] to provide your oncology and hematology care.  To afford each patient quality time with our provider, please arrive at least 15 minutes before your scheduled appointment time.  ? ?If you have a lab appointment with the [facility] please come in thru the Main Entrance and check in at the main information desk. ? ?You need to re-schedule your appointment should you arrive 10 or more minutes late.  We strive to give you quality time with our providers, and arriving late affects you and other patients whose appointments are after yours.  Also, if you no show three or more times for appointments you may be dismissed from the clinic at the providers discretion.     ?Again, thank you for choosing [facility].  Our hope is that these requests will decrease the amount of time that you wait before being seen by our physicians.       ?_____________________________________________________________ ? ?Should you have questions after your visit to [facility], please contact our office at [phone] and follow the prompts.  Our office hours are 8:00 a.m. and 4:30 p.m. Monday - Friday.  Please note that voicemails left after 4:00 p.m. may not be returned until the following business day.  We are closed weekends and major holidays.  You do have access to a nurse 24-7, just call the main number to the clinic [phone] and do not press any options, hold on the line and a nurse will answer the phone.   ? ?For prescription refill requests, have your  pharmacy contact our office and allow 72 hours.   ? ?Due to Covid, you will need to wear a mask upon entering the hospital. If you do not have a mask, a mask will be given to you at the Main Entrance upon arrival. For doctor visits, patients may have 1 support person age 11 or older with them. For treatment visits, patients can not have anyone with them due to social distancing guidelines and our immunocompromised population.  ? ?  ?

## 2021-09-14 NOTE — Patient Instructions (Signed)
[facility]  Discharge Instructions: Thank you for choosing [facility] to provide your oncology and hematology care.  If you have a lab appointment with the Cancer Center, please come in thru the Main Entrance and check in at the main information desk.  Wear comfortable clothing and clothing appropriate for easy access to any Portacath or PICC line.   We strive to give you quality time with your provider. You may need to reschedule your appointment if you arrive late (15 or more minutes).  Arriving late affects you and other patients whose appointments are after yours.  Also, if you miss three or more appointments without notifying the office, you may be dismissed from the clinic at the provider's discretion.      For prescription refill requests, have your pharmacy contact our office and allow 72 hours for refills to be completed.        To help prevent nausea and vomiting after your treatment, we encourage you to take your nausea medication as directed.  BELOW ARE SYMPTOMS THAT SHOULD BE REPORTED IMMEDIATELY: *FEVER GREATER THAN 100.4 F (38 C) OR HIGHER *CHILLS OR SWEATING *NAUSEA AND VOMITING THAT IS NOT CONTROLLED WITH YOUR NAUSEA MEDICATION *UNUSUAL SHORTNESS OF BREATH *UNUSUAL BRUISING OR BLEEDING *URINARY PROBLEMS (pain or burning when urinating, or frequent urination) *BOWEL PROBLEMS (unusual diarrhea, constipation, pain near the anus) TENDERNESS IN MOUTH AND THROAT WITH OR WITHOUT PRESENCE OF ULCERS (sore throat, sores in mouth, or a toothache) UNUSUAL RASH, SWELLING OR PAIN  UNUSUAL VAGINAL DISCHARGE OR ITCHING   Items with * indicate a potential emergency and should be followed up as soon as possible or go to the Emergency Department if any problems should occur.  Please show the CHEMOTHERAPY ALERT CARD or IMMUNOTHERAPY ALERT CARD at check-in to the Emergency Department and triage nurse.  Should you have questions after your visit or need to cancel  or reschedule your appointment, please contact [facility] [phone]  and follow the prompts.  Office hours are 8:00 a.m. to 4:30 p.m. Monday - Friday. Please note that voicemails left after 4:00 p.m. may not be returned until the following business day.  We are closed weekends and major holidays. You have access to a nurse at all times for urgent questions. Please call the main number to the clinic [phone] and follow the prompts.  For any non-urgent questions, you may also contact your provider using MyChart. We now offer e-Visits for anyone 18 and older to request care online for non-urgent symptoms. For details visit [email or web address].   Also download the MyChart app! Go to the app store, search "MyChart", open the app, select [facility], and log in with your MyChart username and password.  Due to Covid, a mask is required upon entering the hospital/clinic. If you do not have a mask, one will be given to you upon arrival. For doctor visits, patients may have 1 support person aged 18 or older with them. For treatment visits, patients cannot have anyone with them due to current Covid guidelines and our immunocompromised population.  

## 2021-09-14 NOTE — Progress Notes (Signed)
Patient tolerated Sandostatin injection with no complaints voiced.  Site clean and dry with no bruising or swelling noted.  No complaints of pain.  Discharged with vital signs stable and no signs or symptoms of distress noted.  

## 2021-09-15 DIAGNOSIS — N2581 Secondary hyperparathyroidism of renal origin: Secondary | ICD-10-CM | POA: Diagnosis not present

## 2021-09-15 DIAGNOSIS — Z23 Encounter for immunization: Secondary | ICD-10-CM | POA: Diagnosis not present

## 2021-09-15 DIAGNOSIS — N250 Renal osteodystrophy: Secondary | ICD-10-CM | POA: Diagnosis not present

## 2021-09-15 DIAGNOSIS — Z992 Dependence on renal dialysis: Secondary | ICD-10-CM | POA: Diagnosis not present

## 2021-09-15 DIAGNOSIS — N186 End stage renal disease: Secondary | ICD-10-CM | POA: Diagnosis not present

## 2021-09-15 DIAGNOSIS — D631 Anemia in chronic kidney disease: Secondary | ICD-10-CM | POA: Diagnosis not present

## 2021-09-15 DIAGNOSIS — D509 Iron deficiency anemia, unspecified: Secondary | ICD-10-CM | POA: Diagnosis not present

## 2021-09-17 DIAGNOSIS — Z23 Encounter for immunization: Secondary | ICD-10-CM | POA: Diagnosis not present

## 2021-09-17 DIAGNOSIS — D509 Iron deficiency anemia, unspecified: Secondary | ICD-10-CM | POA: Diagnosis not present

## 2021-09-17 DIAGNOSIS — D631 Anemia in chronic kidney disease: Secondary | ICD-10-CM | POA: Diagnosis not present

## 2021-09-17 DIAGNOSIS — Z992 Dependence on renal dialysis: Secondary | ICD-10-CM | POA: Diagnosis not present

## 2021-09-17 DIAGNOSIS — N2581 Secondary hyperparathyroidism of renal origin: Secondary | ICD-10-CM | POA: Diagnosis not present

## 2021-09-17 DIAGNOSIS — N186 End stage renal disease: Secondary | ICD-10-CM | POA: Diagnosis not present

## 2021-09-20 DIAGNOSIS — D509 Iron deficiency anemia, unspecified: Secondary | ICD-10-CM | POA: Diagnosis not present

## 2021-09-20 DIAGNOSIS — N186 End stage renal disease: Secondary | ICD-10-CM | POA: Diagnosis not present

## 2021-09-20 DIAGNOSIS — Z23 Encounter for immunization: Secondary | ICD-10-CM | POA: Diagnosis not present

## 2021-09-20 DIAGNOSIS — N2581 Secondary hyperparathyroidism of renal origin: Secondary | ICD-10-CM | POA: Diagnosis not present

## 2021-09-20 DIAGNOSIS — D631 Anemia in chronic kidney disease: Secondary | ICD-10-CM | POA: Diagnosis not present

## 2021-09-20 DIAGNOSIS — Z992 Dependence on renal dialysis: Secondary | ICD-10-CM | POA: Diagnosis not present

## 2021-09-22 DIAGNOSIS — D631 Anemia in chronic kidney disease: Secondary | ICD-10-CM | POA: Diagnosis not present

## 2021-09-22 DIAGNOSIS — N186 End stage renal disease: Secondary | ICD-10-CM | POA: Diagnosis not present

## 2021-09-22 DIAGNOSIS — Z23 Encounter for immunization: Secondary | ICD-10-CM | POA: Diagnosis not present

## 2021-09-22 DIAGNOSIS — N2581 Secondary hyperparathyroidism of renal origin: Secondary | ICD-10-CM | POA: Diagnosis not present

## 2021-09-22 DIAGNOSIS — D509 Iron deficiency anemia, unspecified: Secondary | ICD-10-CM | POA: Diagnosis not present

## 2021-09-22 DIAGNOSIS — Z992 Dependence on renal dialysis: Secondary | ICD-10-CM | POA: Diagnosis not present

## 2021-09-24 DIAGNOSIS — N2581 Secondary hyperparathyroidism of renal origin: Secondary | ICD-10-CM | POA: Diagnosis not present

## 2021-09-24 DIAGNOSIS — Z992 Dependence on renal dialysis: Secondary | ICD-10-CM | POA: Diagnosis not present

## 2021-09-24 DIAGNOSIS — D509 Iron deficiency anemia, unspecified: Secondary | ICD-10-CM | POA: Diagnosis not present

## 2021-09-24 DIAGNOSIS — N186 End stage renal disease: Secondary | ICD-10-CM | POA: Diagnosis not present

## 2021-09-24 DIAGNOSIS — D631 Anemia in chronic kidney disease: Secondary | ICD-10-CM | POA: Diagnosis not present

## 2021-09-24 DIAGNOSIS — Z23 Encounter for immunization: Secondary | ICD-10-CM | POA: Diagnosis not present

## 2021-09-27 DIAGNOSIS — N186 End stage renal disease: Secondary | ICD-10-CM | POA: Diagnosis not present

## 2021-09-27 DIAGNOSIS — Z23 Encounter for immunization: Secondary | ICD-10-CM | POA: Diagnosis not present

## 2021-09-27 DIAGNOSIS — D631 Anemia in chronic kidney disease: Secondary | ICD-10-CM | POA: Diagnosis not present

## 2021-09-27 DIAGNOSIS — D509 Iron deficiency anemia, unspecified: Secondary | ICD-10-CM | POA: Diagnosis not present

## 2021-09-27 DIAGNOSIS — Z992 Dependence on renal dialysis: Secondary | ICD-10-CM | POA: Diagnosis not present

## 2021-09-27 DIAGNOSIS — N2581 Secondary hyperparathyroidism of renal origin: Secondary | ICD-10-CM | POA: Diagnosis not present

## 2021-09-29 DIAGNOSIS — N186 End stage renal disease: Secondary | ICD-10-CM | POA: Diagnosis not present

## 2021-09-29 DIAGNOSIS — Z992 Dependence on renal dialysis: Secondary | ICD-10-CM | POA: Diagnosis not present

## 2021-09-29 DIAGNOSIS — N2581 Secondary hyperparathyroidism of renal origin: Secondary | ICD-10-CM | POA: Diagnosis not present

## 2021-09-29 DIAGNOSIS — Z23 Encounter for immunization: Secondary | ICD-10-CM | POA: Diagnosis not present

## 2021-09-29 DIAGNOSIS — D509 Iron deficiency anemia, unspecified: Secondary | ICD-10-CM | POA: Diagnosis not present

## 2021-09-29 DIAGNOSIS — D631 Anemia in chronic kidney disease: Secondary | ICD-10-CM | POA: Diagnosis not present

## 2021-10-01 DIAGNOSIS — N186 End stage renal disease: Secondary | ICD-10-CM | POA: Diagnosis not present

## 2021-10-01 DIAGNOSIS — D509 Iron deficiency anemia, unspecified: Secondary | ICD-10-CM | POA: Diagnosis not present

## 2021-10-01 DIAGNOSIS — D631 Anemia in chronic kidney disease: Secondary | ICD-10-CM | POA: Diagnosis not present

## 2021-10-01 DIAGNOSIS — N2581 Secondary hyperparathyroidism of renal origin: Secondary | ICD-10-CM | POA: Diagnosis not present

## 2021-10-01 DIAGNOSIS — Z23 Encounter for immunization: Secondary | ICD-10-CM | POA: Diagnosis not present

## 2021-10-01 DIAGNOSIS — Z992 Dependence on renal dialysis: Secondary | ICD-10-CM | POA: Diagnosis not present

## 2021-10-04 DIAGNOSIS — Z992 Dependence on renal dialysis: Secondary | ICD-10-CM | POA: Diagnosis not present

## 2021-10-04 DIAGNOSIS — D631 Anemia in chronic kidney disease: Secondary | ICD-10-CM | POA: Diagnosis not present

## 2021-10-04 DIAGNOSIS — D509 Iron deficiency anemia, unspecified: Secondary | ICD-10-CM | POA: Diagnosis not present

## 2021-10-04 DIAGNOSIS — N186 End stage renal disease: Secondary | ICD-10-CM | POA: Diagnosis not present

## 2021-10-04 DIAGNOSIS — Z23 Encounter for immunization: Secondary | ICD-10-CM | POA: Diagnosis not present

## 2021-10-04 DIAGNOSIS — N2581 Secondary hyperparathyroidism of renal origin: Secondary | ICD-10-CM | POA: Diagnosis not present

## 2021-10-06 DIAGNOSIS — Z992 Dependence on renal dialysis: Secondary | ICD-10-CM | POA: Diagnosis not present

## 2021-10-06 DIAGNOSIS — Z23 Encounter for immunization: Secondary | ICD-10-CM | POA: Diagnosis not present

## 2021-10-06 DIAGNOSIS — N2581 Secondary hyperparathyroidism of renal origin: Secondary | ICD-10-CM | POA: Diagnosis not present

## 2021-10-06 DIAGNOSIS — N186 End stage renal disease: Secondary | ICD-10-CM | POA: Diagnosis not present

## 2021-10-06 DIAGNOSIS — D509 Iron deficiency anemia, unspecified: Secondary | ICD-10-CM | POA: Diagnosis not present

## 2021-10-06 DIAGNOSIS — D631 Anemia in chronic kidney disease: Secondary | ICD-10-CM | POA: Diagnosis not present

## 2021-10-08 DIAGNOSIS — N186 End stage renal disease: Secondary | ICD-10-CM | POA: Diagnosis not present

## 2021-10-08 DIAGNOSIS — Z992 Dependence on renal dialysis: Secondary | ICD-10-CM | POA: Diagnosis not present

## 2021-10-08 DIAGNOSIS — D509 Iron deficiency anemia, unspecified: Secondary | ICD-10-CM | POA: Diagnosis not present

## 2021-10-08 DIAGNOSIS — Z23 Encounter for immunization: Secondary | ICD-10-CM | POA: Diagnosis not present

## 2021-10-08 DIAGNOSIS — D631 Anemia in chronic kidney disease: Secondary | ICD-10-CM | POA: Diagnosis not present

## 2021-10-08 DIAGNOSIS — N2581 Secondary hyperparathyroidism of renal origin: Secondary | ICD-10-CM | POA: Diagnosis not present

## 2021-10-11 DIAGNOSIS — N2581 Secondary hyperparathyroidism of renal origin: Secondary | ICD-10-CM | POA: Diagnosis not present

## 2021-10-11 DIAGNOSIS — N186 End stage renal disease: Secondary | ICD-10-CM | POA: Diagnosis not present

## 2021-10-11 DIAGNOSIS — Z23 Encounter for immunization: Secondary | ICD-10-CM | POA: Diagnosis not present

## 2021-10-11 DIAGNOSIS — Z992 Dependence on renal dialysis: Secondary | ICD-10-CM | POA: Diagnosis not present

## 2021-10-11 DIAGNOSIS — D631 Anemia in chronic kidney disease: Secondary | ICD-10-CM | POA: Diagnosis not present

## 2021-10-11 DIAGNOSIS — D509 Iron deficiency anemia, unspecified: Secondary | ICD-10-CM | POA: Diagnosis not present

## 2021-10-12 ENCOUNTER — Encounter (HOSPITAL_COMMUNITY): Payer: Self-pay

## 2021-10-12 ENCOUNTER — Inpatient Hospital Stay (HOSPITAL_COMMUNITY): Payer: Medicare Other

## 2021-10-12 ENCOUNTER — Other Ambulatory Visit: Payer: Self-pay

## 2021-10-12 VITALS — BP 95/56 | HR 73 | Temp 98.1°F | Resp 18 | Ht 77.0 in | Wt 186.6 lb

## 2021-10-12 DIAGNOSIS — C7A00 Malignant carcinoid tumor of unspecified site: Secondary | ICD-10-CM | POA: Diagnosis not present

## 2021-10-12 DIAGNOSIS — C7B02 Secondary carcinoid tumors of liver: Secondary | ICD-10-CM | POA: Diagnosis not present

## 2021-10-12 MED ORDER — OCTREOTIDE ACETATE 30 MG IM KIT
30.0000 mg | PACK | Freq: Once | INTRAMUSCULAR | Status: AC
  Administered 2021-10-12: 30 mg via INTRAMUSCULAR

## 2021-10-12 NOTE — Progress Notes (Signed)
Patient tolerated Sandostatin injection with no complaints voiced.  Patient stable during and after injection.  See MAR for details.  Site clean and dry with no bruising or swelling noted at site.  Band aid applied.  Vss with discharge and left in satisfactory condition with no s/s of distress noted.  

## 2021-10-12 NOTE — Patient Instructions (Signed)
[facility]  Discharge Instructions: Thank you for choosing [facility] to provide your oncology and hematology care.  If you have a lab appointment with the Cancer Center, please come in thru the Main Entrance and check in at the main information desk.  Wear comfortable clothing and clothing appropriate for easy access to any Portacath or PICC line.   We strive to give you quality time with your provider. You may need to reschedule your appointment if you arrive late (15 or more minutes).  Arriving late affects you and other patients whose appointments are after yours.  Also, if you miss three or more appointments without notifying the office, you may be dismissed from the clinic at the provider's discretion.      For prescription refill requests, have your pharmacy contact our office and allow 72 hours for refills to be completed.        To help prevent nausea and vomiting after your treatment, we encourage you to take your nausea medication as directed.  BELOW ARE SYMPTOMS THAT SHOULD BE REPORTED IMMEDIATELY: *FEVER GREATER THAN 100.4 F (38 C) OR HIGHER *CHILLS OR SWEATING *NAUSEA AND VOMITING THAT IS NOT CONTROLLED WITH YOUR NAUSEA MEDICATION *UNUSUAL SHORTNESS OF BREATH *UNUSUAL BRUISING OR BLEEDING *URINARY PROBLEMS (pain or burning when urinating, or frequent urination) *BOWEL PROBLEMS (unusual diarrhea, constipation, pain near the anus) TENDERNESS IN MOUTH AND THROAT WITH OR WITHOUT PRESENCE OF ULCERS (sore throat, sores in mouth, or a toothache) UNUSUAL RASH, SWELLING OR PAIN  UNUSUAL VAGINAL DISCHARGE OR ITCHING   Items with * indicate a potential emergency and should be followed up as soon as possible or go to the Emergency Department if any problems should occur.  Please show the CHEMOTHERAPY ALERT CARD or IMMUNOTHERAPY ALERT CARD at check-in to the Emergency Department and triage nurse.  Should you have questions after your visit or need to cancel  or reschedule your appointment, please contact [facility] [phone]  and follow the prompts.  Office hours are 8:00 a.m. to 4:30 p.m. Monday - Friday. Please note that voicemails left after 4:00 p.m. may not be returned until the following business day.  We are closed weekends and major holidays. You have access to a nurse at all times for urgent questions. Please call the main number to the clinic [phone] and follow the prompts.  For any non-urgent questions, you may also contact your provider using MyChart. We now offer e-Visits for anyone 18 and older to request care online for non-urgent symptoms. For details visit [email or web address].   Also download the MyChart app! Go to the app store, search "MyChart", open the app, select [facility], and log in with your MyChart username and password.  Due to Covid, a mask is required upon entering the hospital/clinic. If you do not have a mask, one will be given to you upon arrival. For doctor visits, patients may have 1 support person aged 18 or older with them. For treatment visits, patients cannot have anyone with them due to current Covid guidelines and our immunocompromised population.  

## 2021-10-13 DIAGNOSIS — Z992 Dependence on renal dialysis: Secondary | ICD-10-CM | POA: Diagnosis not present

## 2021-10-13 DIAGNOSIS — D509 Iron deficiency anemia, unspecified: Secondary | ICD-10-CM | POA: Diagnosis not present

## 2021-10-13 DIAGNOSIS — Z23 Encounter for immunization: Secondary | ICD-10-CM | POA: Diagnosis not present

## 2021-10-13 DIAGNOSIS — N186 End stage renal disease: Secondary | ICD-10-CM | POA: Diagnosis not present

## 2021-10-13 DIAGNOSIS — D631 Anemia in chronic kidney disease: Secondary | ICD-10-CM | POA: Diagnosis not present

## 2021-10-13 DIAGNOSIS — N2581 Secondary hyperparathyroidism of renal origin: Secondary | ICD-10-CM | POA: Diagnosis not present

## 2021-10-14 DIAGNOSIS — Z992 Dependence on renal dialysis: Secondary | ICD-10-CM | POA: Diagnosis not present

## 2021-10-14 DIAGNOSIS — N186 End stage renal disease: Secondary | ICD-10-CM | POA: Diagnosis not present

## 2021-10-15 DIAGNOSIS — D631 Anemia in chronic kidney disease: Secondary | ICD-10-CM | POA: Diagnosis not present

## 2021-10-15 DIAGNOSIS — N250 Renal osteodystrophy: Secondary | ICD-10-CM | POA: Diagnosis not present

## 2021-10-15 DIAGNOSIS — Z992 Dependence on renal dialysis: Secondary | ICD-10-CM | POA: Diagnosis not present

## 2021-10-15 DIAGNOSIS — N186 End stage renal disease: Secondary | ICD-10-CM | POA: Diagnosis not present

## 2021-10-15 DIAGNOSIS — N2581 Secondary hyperparathyroidism of renal origin: Secondary | ICD-10-CM | POA: Diagnosis not present

## 2021-10-15 DIAGNOSIS — E559 Vitamin D deficiency, unspecified: Secondary | ICD-10-CM | POA: Diagnosis not present

## 2021-10-15 DIAGNOSIS — D509 Iron deficiency anemia, unspecified: Secondary | ICD-10-CM | POA: Diagnosis not present

## 2021-10-18 DIAGNOSIS — N2581 Secondary hyperparathyroidism of renal origin: Secondary | ICD-10-CM | POA: Diagnosis not present

## 2021-10-18 DIAGNOSIS — D509 Iron deficiency anemia, unspecified: Secondary | ICD-10-CM | POA: Diagnosis not present

## 2021-10-18 DIAGNOSIS — Z992 Dependence on renal dialysis: Secondary | ICD-10-CM | POA: Diagnosis not present

## 2021-10-18 DIAGNOSIS — N250 Renal osteodystrophy: Secondary | ICD-10-CM | POA: Diagnosis not present

## 2021-10-18 DIAGNOSIS — N186 End stage renal disease: Secondary | ICD-10-CM | POA: Diagnosis not present

## 2021-10-18 DIAGNOSIS — D631 Anemia in chronic kidney disease: Secondary | ICD-10-CM | POA: Diagnosis not present

## 2021-10-20 DIAGNOSIS — D631 Anemia in chronic kidney disease: Secondary | ICD-10-CM | POA: Diagnosis not present

## 2021-10-20 DIAGNOSIS — N2581 Secondary hyperparathyroidism of renal origin: Secondary | ICD-10-CM | POA: Diagnosis not present

## 2021-10-20 DIAGNOSIS — N186 End stage renal disease: Secondary | ICD-10-CM | POA: Diagnosis not present

## 2021-10-20 DIAGNOSIS — N250 Renal osteodystrophy: Secondary | ICD-10-CM | POA: Diagnosis not present

## 2021-10-20 DIAGNOSIS — Z992 Dependence on renal dialysis: Secondary | ICD-10-CM | POA: Diagnosis not present

## 2021-10-20 DIAGNOSIS — D509 Iron deficiency anemia, unspecified: Secondary | ICD-10-CM | POA: Diagnosis not present

## 2021-10-22 DIAGNOSIS — D509 Iron deficiency anemia, unspecified: Secondary | ICD-10-CM | POA: Diagnosis not present

## 2021-10-22 DIAGNOSIS — N186 End stage renal disease: Secondary | ICD-10-CM | POA: Diagnosis not present

## 2021-10-22 DIAGNOSIS — N2581 Secondary hyperparathyroidism of renal origin: Secondary | ICD-10-CM | POA: Diagnosis not present

## 2021-10-22 DIAGNOSIS — N250 Renal osteodystrophy: Secondary | ICD-10-CM | POA: Diagnosis not present

## 2021-10-22 DIAGNOSIS — Z992 Dependence on renal dialysis: Secondary | ICD-10-CM | POA: Diagnosis not present

## 2021-10-22 DIAGNOSIS — D631 Anemia in chronic kidney disease: Secondary | ICD-10-CM | POA: Diagnosis not present

## 2021-10-25 DIAGNOSIS — Z992 Dependence on renal dialysis: Secondary | ICD-10-CM | POA: Diagnosis not present

## 2021-10-25 DIAGNOSIS — N186 End stage renal disease: Secondary | ICD-10-CM | POA: Diagnosis not present

## 2021-10-25 DIAGNOSIS — D631 Anemia in chronic kidney disease: Secondary | ICD-10-CM | POA: Diagnosis not present

## 2021-10-25 DIAGNOSIS — N2581 Secondary hyperparathyroidism of renal origin: Secondary | ICD-10-CM | POA: Diagnosis not present

## 2021-10-25 DIAGNOSIS — D509 Iron deficiency anemia, unspecified: Secondary | ICD-10-CM | POA: Diagnosis not present

## 2021-10-25 DIAGNOSIS — N250 Renal osteodystrophy: Secondary | ICD-10-CM | POA: Diagnosis not present

## 2021-10-27 DIAGNOSIS — N2581 Secondary hyperparathyroidism of renal origin: Secondary | ICD-10-CM | POA: Diagnosis not present

## 2021-10-27 DIAGNOSIS — N186 End stage renal disease: Secondary | ICD-10-CM | POA: Diagnosis not present

## 2021-10-27 DIAGNOSIS — Z992 Dependence on renal dialysis: Secondary | ICD-10-CM | POA: Diagnosis not present

## 2021-10-27 DIAGNOSIS — N250 Renal osteodystrophy: Secondary | ICD-10-CM | POA: Diagnosis not present

## 2021-10-27 DIAGNOSIS — D509 Iron deficiency anemia, unspecified: Secondary | ICD-10-CM | POA: Diagnosis not present

## 2021-10-27 DIAGNOSIS — D631 Anemia in chronic kidney disease: Secondary | ICD-10-CM | POA: Diagnosis not present

## 2021-10-29 DIAGNOSIS — D509 Iron deficiency anemia, unspecified: Secondary | ICD-10-CM | POA: Diagnosis not present

## 2021-10-29 DIAGNOSIS — Z992 Dependence on renal dialysis: Secondary | ICD-10-CM | POA: Diagnosis not present

## 2021-10-29 DIAGNOSIS — D631 Anemia in chronic kidney disease: Secondary | ICD-10-CM | POA: Diagnosis not present

## 2021-10-29 DIAGNOSIS — N250 Renal osteodystrophy: Secondary | ICD-10-CM | POA: Diagnosis not present

## 2021-10-29 DIAGNOSIS — N186 End stage renal disease: Secondary | ICD-10-CM | POA: Diagnosis not present

## 2021-10-29 DIAGNOSIS — N2581 Secondary hyperparathyroidism of renal origin: Secondary | ICD-10-CM | POA: Diagnosis not present

## 2021-10-31 DIAGNOSIS — E1140 Type 2 diabetes mellitus with diabetic neuropathy, unspecified: Secondary | ICD-10-CM | POA: Diagnosis not present

## 2021-10-31 DIAGNOSIS — E1151 Type 2 diabetes mellitus with diabetic peripheral angiopathy without gangrene: Secondary | ICD-10-CM | POA: Diagnosis not present

## 2021-11-01 DIAGNOSIS — N186 End stage renal disease: Secondary | ICD-10-CM | POA: Diagnosis not present

## 2021-11-01 DIAGNOSIS — Z992 Dependence on renal dialysis: Secondary | ICD-10-CM | POA: Diagnosis not present

## 2021-11-01 DIAGNOSIS — N2581 Secondary hyperparathyroidism of renal origin: Secondary | ICD-10-CM | POA: Diagnosis not present

## 2021-11-01 DIAGNOSIS — D631 Anemia in chronic kidney disease: Secondary | ICD-10-CM | POA: Diagnosis not present

## 2021-11-01 DIAGNOSIS — D509 Iron deficiency anemia, unspecified: Secondary | ICD-10-CM | POA: Diagnosis not present

## 2021-11-01 DIAGNOSIS — N250 Renal osteodystrophy: Secondary | ICD-10-CM | POA: Diagnosis not present

## 2021-11-03 DIAGNOSIS — Z992 Dependence on renal dialysis: Secondary | ICD-10-CM | POA: Diagnosis not present

## 2021-11-03 DIAGNOSIS — N186 End stage renal disease: Secondary | ICD-10-CM | POA: Diagnosis not present

## 2021-11-03 DIAGNOSIS — N250 Renal osteodystrophy: Secondary | ICD-10-CM | POA: Diagnosis not present

## 2021-11-03 DIAGNOSIS — D631 Anemia in chronic kidney disease: Secondary | ICD-10-CM | POA: Diagnosis not present

## 2021-11-03 DIAGNOSIS — N2581 Secondary hyperparathyroidism of renal origin: Secondary | ICD-10-CM | POA: Diagnosis not present

## 2021-11-03 DIAGNOSIS — D509 Iron deficiency anemia, unspecified: Secondary | ICD-10-CM | POA: Diagnosis not present

## 2021-11-05 DIAGNOSIS — D631 Anemia in chronic kidney disease: Secondary | ICD-10-CM | POA: Diagnosis not present

## 2021-11-05 DIAGNOSIS — D509 Iron deficiency anemia, unspecified: Secondary | ICD-10-CM | POA: Diagnosis not present

## 2021-11-05 DIAGNOSIS — N250 Renal osteodystrophy: Secondary | ICD-10-CM | POA: Diagnosis not present

## 2021-11-05 DIAGNOSIS — N186 End stage renal disease: Secondary | ICD-10-CM | POA: Diagnosis not present

## 2021-11-05 DIAGNOSIS — Z992 Dependence on renal dialysis: Secondary | ICD-10-CM | POA: Diagnosis not present

## 2021-11-05 DIAGNOSIS — N2581 Secondary hyperparathyroidism of renal origin: Secondary | ICD-10-CM | POA: Diagnosis not present

## 2021-11-08 DIAGNOSIS — N2581 Secondary hyperparathyroidism of renal origin: Secondary | ICD-10-CM | POA: Diagnosis not present

## 2021-11-08 DIAGNOSIS — N186 End stage renal disease: Secondary | ICD-10-CM | POA: Diagnosis not present

## 2021-11-08 DIAGNOSIS — D631 Anemia in chronic kidney disease: Secondary | ICD-10-CM | POA: Diagnosis not present

## 2021-11-08 DIAGNOSIS — N250 Renal osteodystrophy: Secondary | ICD-10-CM | POA: Diagnosis not present

## 2021-11-08 DIAGNOSIS — Z992 Dependence on renal dialysis: Secondary | ICD-10-CM | POA: Diagnosis not present

## 2021-11-08 DIAGNOSIS — D509 Iron deficiency anemia, unspecified: Secondary | ICD-10-CM | POA: Diagnosis not present

## 2021-11-09 ENCOUNTER — Inpatient Hospital Stay (HOSPITAL_COMMUNITY): Payer: Medicare Other | Attending: Hematology

## 2021-11-09 VITALS — BP 102/63 | HR 75 | Temp 98.1°F | Resp 18 | Ht 77.0 in | Wt 187.2 lb

## 2021-11-09 DIAGNOSIS — C7B02 Secondary carcinoid tumors of liver: Secondary | ICD-10-CM | POA: Insufficient documentation

## 2021-11-09 DIAGNOSIS — C7A00 Malignant carcinoid tumor of unspecified site: Secondary | ICD-10-CM | POA: Diagnosis not present

## 2021-11-09 MED ORDER — OCTREOTIDE ACETATE 30 MG IM KIT
30.0000 mg | PACK | Freq: Once | INTRAMUSCULAR | Status: AC
  Administered 2021-11-09: 30 mg via INTRAMUSCULAR

## 2021-11-09 NOTE — Patient Instructions (Signed)
[facility]  Discharge Instructions: ?Thank you for choosing [facility] to provide your oncology and hematology care.  ?If you have a lab appointment with the [facility], please come in thru the Main Entrance and check in at the main information desk. ? ?Wear comfortable clothing and clothing appropriate for easy access to any Portacath or PICC line.  ? ?We strive to give you quality time with your provider. You may need to reschedule your appointment if you arrive late (15 or more minutes).  Arriving late affects you and other patients whose appointments are after yours.  Also, if you miss three or more appointments without notifying the office, you may be dismissed from the clinic at the provider?s discretion.    ?  ?For prescription refill requests, have your pharmacy contact our office and allow 72 hours for refills to be completed.   ? ?Today you received Sandostatin 30 mg injection. ?  ? ? ?BELOW ARE SYMPTOMS THAT SHOULD BE REPORTED IMMEDIATELY: ?*FEVER GREATER THAN 100.4 F (38 ?C) OR HIGHER ?*CHILLS OR SWEATING ?*NAUSEA AND VOMITING THAT IS NOT CONTROLLED WITH YOUR NAUSEA MEDICATION ?*UNUSUAL SHORTNESS OF BREATH ?*UNUSUAL BRUISING OR BLEEDING ?*URINARY PROBLEMS (pain or burning when urinating, or frequent urination) ?*BOWEL PROBLEMS (unusual diarrhea, constipation, pain near the anus) ?TENDERNESS IN MOUTH AND THROAT WITH OR WITHOUT PRESENCE OF ULCERS (sore throat, sores in mouth, or a toothache) ?UNUSUAL RASH, SWELLING OR PAIN  ?UNUSUAL VAGINAL DISCHARGE OR ITCHING  ? ?Items with * indicate a potential emergency and should be followed up as soon as possible or go to the Emergency Department if any problems should occur. ? ?Please show the CHEMOTHERAPY ALERT CARD or IMMUNOTHERAPY ALERT CARD at check-in to the Emergency Department and triage nurse. ? ?Should you have questions after your visit or need to cancel or reschedule your appointment, please contact [facility] [phone]  and follow the prompts.  Office hours are 8:00 a.m. to 4:30 p.m. Monday - Friday. Please note that voicemails left after 4:00 p.m. may not be returned until the following business day.  We are closed weekends and major holidays. You have access to a nurse at all times for urgent questions. Please call the main number to the clinic [phone] and follow the prompts. ? ?For any non-urgent questions, you may also contact your provider using MyChart. We now offer e-Visits for anyone 34 and older to request care online for non-urgent symptoms. For details visit [email or web address]. ?  ?Also download the MyChart app! Go to the app store, search "MyChart", open the app, select [facility], and log in with your MyChart username and password. ? ?Due to Covid, a mask is required upon entering the hospital/clinic. If you do not have a mask, one will be given to you upon arrival. For doctor visits, patients may have 1 support person aged 3 or older with them. For treatment visits, patients cannot have anyone with them due to current Covid guidelines and our immunocompromised population.  ?

## 2021-11-09 NOTE — Progress Notes (Signed)
Allen Davenport presents today for injection per the provider's orders.  Sandostatin 30 mg  administration without incident; injection site WNL; see MAR for injection details.  Patient tolerated procedure well and without incident.  No questions or complaints noted at this time.  ? ?Discharged from clinic ambulatory in stable condition. Alert and oriented x 3. F/U with [facility] as scheduled.   ?

## 2021-11-10 DIAGNOSIS — N186 End stage renal disease: Secondary | ICD-10-CM | POA: Diagnosis not present

## 2021-11-10 DIAGNOSIS — N250 Renal osteodystrophy: Secondary | ICD-10-CM | POA: Diagnosis not present

## 2021-11-10 DIAGNOSIS — D631 Anemia in chronic kidney disease: Secondary | ICD-10-CM | POA: Diagnosis not present

## 2021-11-10 DIAGNOSIS — D509 Iron deficiency anemia, unspecified: Secondary | ICD-10-CM | POA: Diagnosis not present

## 2021-11-10 DIAGNOSIS — N2581 Secondary hyperparathyroidism of renal origin: Secondary | ICD-10-CM | POA: Diagnosis not present

## 2021-11-10 DIAGNOSIS — Z992 Dependence on renal dialysis: Secondary | ICD-10-CM | POA: Diagnosis not present

## 2021-11-12 DIAGNOSIS — D509 Iron deficiency anemia, unspecified: Secondary | ICD-10-CM | POA: Diagnosis not present

## 2021-11-12 DIAGNOSIS — N250 Renal osteodystrophy: Secondary | ICD-10-CM | POA: Diagnosis not present

## 2021-11-12 DIAGNOSIS — N2581 Secondary hyperparathyroidism of renal origin: Secondary | ICD-10-CM | POA: Diagnosis not present

## 2021-11-12 DIAGNOSIS — N186 End stage renal disease: Secondary | ICD-10-CM | POA: Diagnosis not present

## 2021-11-12 DIAGNOSIS — D631 Anemia in chronic kidney disease: Secondary | ICD-10-CM | POA: Diagnosis not present

## 2021-11-12 DIAGNOSIS — Z992 Dependence on renal dialysis: Secondary | ICD-10-CM | POA: Diagnosis not present

## 2021-11-13 DIAGNOSIS — Z992 Dependence on renal dialysis: Secondary | ICD-10-CM | POA: Diagnosis not present

## 2021-11-13 DIAGNOSIS — N186 End stage renal disease: Secondary | ICD-10-CM | POA: Diagnosis not present

## 2021-11-15 DIAGNOSIS — E559 Vitamin D deficiency, unspecified: Secondary | ICD-10-CM | POA: Diagnosis not present

## 2021-11-15 DIAGNOSIS — Z23 Encounter for immunization: Secondary | ICD-10-CM | POA: Diagnosis not present

## 2021-11-15 DIAGNOSIS — D509 Iron deficiency anemia, unspecified: Secondary | ICD-10-CM | POA: Diagnosis not present

## 2021-11-15 DIAGNOSIS — N2581 Secondary hyperparathyroidism of renal origin: Secondary | ICD-10-CM | POA: Diagnosis not present

## 2021-11-15 DIAGNOSIS — N250 Renal osteodystrophy: Secondary | ICD-10-CM | POA: Diagnosis not present

## 2021-11-15 DIAGNOSIS — D631 Anemia in chronic kidney disease: Secondary | ICD-10-CM | POA: Diagnosis not present

## 2021-11-15 DIAGNOSIS — Z992 Dependence on renal dialysis: Secondary | ICD-10-CM | POA: Diagnosis not present

## 2021-11-15 DIAGNOSIS — N186 End stage renal disease: Secondary | ICD-10-CM | POA: Diagnosis not present

## 2021-11-17 DIAGNOSIS — N2581 Secondary hyperparathyroidism of renal origin: Secondary | ICD-10-CM | POA: Diagnosis not present

## 2021-11-17 DIAGNOSIS — D509 Iron deficiency anemia, unspecified: Secondary | ICD-10-CM | POA: Diagnosis not present

## 2021-11-17 DIAGNOSIS — Z23 Encounter for immunization: Secondary | ICD-10-CM | POA: Diagnosis not present

## 2021-11-17 DIAGNOSIS — N250 Renal osteodystrophy: Secondary | ICD-10-CM | POA: Diagnosis not present

## 2021-11-17 DIAGNOSIS — Z992 Dependence on renal dialysis: Secondary | ICD-10-CM | POA: Diagnosis not present

## 2021-11-17 DIAGNOSIS — N186 End stage renal disease: Secondary | ICD-10-CM | POA: Diagnosis not present

## 2021-11-19 DIAGNOSIS — Z992 Dependence on renal dialysis: Secondary | ICD-10-CM | POA: Diagnosis not present

## 2021-11-19 DIAGNOSIS — N186 End stage renal disease: Secondary | ICD-10-CM | POA: Diagnosis not present

## 2021-11-19 DIAGNOSIS — N2581 Secondary hyperparathyroidism of renal origin: Secondary | ICD-10-CM | POA: Diagnosis not present

## 2021-11-19 DIAGNOSIS — D509 Iron deficiency anemia, unspecified: Secondary | ICD-10-CM | POA: Diagnosis not present

## 2021-11-19 DIAGNOSIS — N250 Renal osteodystrophy: Secondary | ICD-10-CM | POA: Diagnosis not present

## 2021-11-19 DIAGNOSIS — Z23 Encounter for immunization: Secondary | ICD-10-CM | POA: Diagnosis not present

## 2021-11-22 DIAGNOSIS — N2581 Secondary hyperparathyroidism of renal origin: Secondary | ICD-10-CM | POA: Diagnosis not present

## 2021-11-22 DIAGNOSIS — N250 Renal osteodystrophy: Secondary | ICD-10-CM | POA: Diagnosis not present

## 2021-11-22 DIAGNOSIS — N186 End stage renal disease: Secondary | ICD-10-CM | POA: Diagnosis not present

## 2021-11-22 DIAGNOSIS — D509 Iron deficiency anemia, unspecified: Secondary | ICD-10-CM | POA: Diagnosis not present

## 2021-11-22 DIAGNOSIS — Z23 Encounter for immunization: Secondary | ICD-10-CM | POA: Diagnosis not present

## 2021-11-22 DIAGNOSIS — Z992 Dependence on renal dialysis: Secondary | ICD-10-CM | POA: Diagnosis not present

## 2021-11-24 DIAGNOSIS — N2581 Secondary hyperparathyroidism of renal origin: Secondary | ICD-10-CM | POA: Diagnosis not present

## 2021-11-24 DIAGNOSIS — Z992 Dependence on renal dialysis: Secondary | ICD-10-CM | POA: Diagnosis not present

## 2021-11-24 DIAGNOSIS — N250 Renal osteodystrophy: Secondary | ICD-10-CM | POA: Diagnosis not present

## 2021-11-24 DIAGNOSIS — D509 Iron deficiency anemia, unspecified: Secondary | ICD-10-CM | POA: Diagnosis not present

## 2021-11-24 DIAGNOSIS — Z23 Encounter for immunization: Secondary | ICD-10-CM | POA: Diagnosis not present

## 2021-11-24 DIAGNOSIS — N186 End stage renal disease: Secondary | ICD-10-CM | POA: Diagnosis not present

## 2021-11-26 DIAGNOSIS — D509 Iron deficiency anemia, unspecified: Secondary | ICD-10-CM | POA: Diagnosis not present

## 2021-11-26 DIAGNOSIS — N2581 Secondary hyperparathyroidism of renal origin: Secondary | ICD-10-CM | POA: Diagnosis not present

## 2021-11-26 DIAGNOSIS — Z992 Dependence on renal dialysis: Secondary | ICD-10-CM | POA: Diagnosis not present

## 2021-11-26 DIAGNOSIS — N250 Renal osteodystrophy: Secondary | ICD-10-CM | POA: Diagnosis not present

## 2021-11-26 DIAGNOSIS — N186 End stage renal disease: Secondary | ICD-10-CM | POA: Diagnosis not present

## 2021-11-26 DIAGNOSIS — Z23 Encounter for immunization: Secondary | ICD-10-CM | POA: Diagnosis not present

## 2021-11-29 DIAGNOSIS — N186 End stage renal disease: Secondary | ICD-10-CM | POA: Diagnosis not present

## 2021-11-29 DIAGNOSIS — N250 Renal osteodystrophy: Secondary | ICD-10-CM | POA: Diagnosis not present

## 2021-11-29 DIAGNOSIS — N2581 Secondary hyperparathyroidism of renal origin: Secondary | ICD-10-CM | POA: Diagnosis not present

## 2021-11-29 DIAGNOSIS — D509 Iron deficiency anemia, unspecified: Secondary | ICD-10-CM | POA: Diagnosis not present

## 2021-11-29 DIAGNOSIS — Z23 Encounter for immunization: Secondary | ICD-10-CM | POA: Diagnosis not present

## 2021-11-29 DIAGNOSIS — Z992 Dependence on renal dialysis: Secondary | ICD-10-CM | POA: Diagnosis not present

## 2021-12-01 DIAGNOSIS — D509 Iron deficiency anemia, unspecified: Secondary | ICD-10-CM | POA: Diagnosis not present

## 2021-12-01 DIAGNOSIS — Z23 Encounter for immunization: Secondary | ICD-10-CM | POA: Diagnosis not present

## 2021-12-01 DIAGNOSIS — N250 Renal osteodystrophy: Secondary | ICD-10-CM | POA: Diagnosis not present

## 2021-12-01 DIAGNOSIS — Z992 Dependence on renal dialysis: Secondary | ICD-10-CM | POA: Diagnosis not present

## 2021-12-01 DIAGNOSIS — N2581 Secondary hyperparathyroidism of renal origin: Secondary | ICD-10-CM | POA: Diagnosis not present

## 2021-12-01 DIAGNOSIS — N186 End stage renal disease: Secondary | ICD-10-CM | POA: Diagnosis not present

## 2021-12-03 DIAGNOSIS — N186 End stage renal disease: Secondary | ICD-10-CM | POA: Diagnosis not present

## 2021-12-03 DIAGNOSIS — D509 Iron deficiency anemia, unspecified: Secondary | ICD-10-CM | POA: Diagnosis not present

## 2021-12-03 DIAGNOSIS — N2581 Secondary hyperparathyroidism of renal origin: Secondary | ICD-10-CM | POA: Diagnosis not present

## 2021-12-03 DIAGNOSIS — Z992 Dependence on renal dialysis: Secondary | ICD-10-CM | POA: Diagnosis not present

## 2021-12-03 DIAGNOSIS — N250 Renal osteodystrophy: Secondary | ICD-10-CM | POA: Diagnosis not present

## 2021-12-03 DIAGNOSIS — Z23 Encounter for immunization: Secondary | ICD-10-CM | POA: Diagnosis not present

## 2021-12-06 DIAGNOSIS — N186 End stage renal disease: Secondary | ICD-10-CM | POA: Diagnosis not present

## 2021-12-06 DIAGNOSIS — D509 Iron deficiency anemia, unspecified: Secondary | ICD-10-CM | POA: Diagnosis not present

## 2021-12-06 DIAGNOSIS — Z23 Encounter for immunization: Secondary | ICD-10-CM | POA: Diagnosis not present

## 2021-12-06 DIAGNOSIS — N250 Renal osteodystrophy: Secondary | ICD-10-CM | POA: Diagnosis not present

## 2021-12-06 DIAGNOSIS — N2581 Secondary hyperparathyroidism of renal origin: Secondary | ICD-10-CM | POA: Diagnosis not present

## 2021-12-06 DIAGNOSIS — Z992 Dependence on renal dialysis: Secondary | ICD-10-CM | POA: Diagnosis not present

## 2021-12-07 ENCOUNTER — Inpatient Hospital Stay (HOSPITAL_COMMUNITY): Payer: Medicare Other | Attending: Hematology

## 2021-12-07 VITALS — BP 99/66 | HR 87 | Temp 98.1°F | Resp 18

## 2021-12-07 DIAGNOSIS — N185 Chronic kidney disease, stage 5: Secondary | ICD-10-CM | POA: Diagnosis not present

## 2021-12-07 DIAGNOSIS — C7B02 Secondary carcinoid tumors of liver: Secondary | ICD-10-CM | POA: Diagnosis not present

## 2021-12-07 DIAGNOSIS — C7A00 Malignant carcinoid tumor of unspecified site: Secondary | ICD-10-CM

## 2021-12-07 DIAGNOSIS — I10 Essential (primary) hypertension: Secondary | ICD-10-CM | POA: Diagnosis not present

## 2021-12-07 DIAGNOSIS — E7849 Other hyperlipidemia: Secondary | ICD-10-CM | POA: Diagnosis not present

## 2021-12-07 DIAGNOSIS — M818 Other osteoporosis without current pathological fracture: Secondary | ICD-10-CM | POA: Diagnosis not present

## 2021-12-07 MED ORDER — OCTREOTIDE ACETATE 30 MG IM KIT
30.0000 mg | PACK | Freq: Once | INTRAMUSCULAR | Status: AC
  Administered 2021-12-07: 30 mg via INTRAMUSCULAR

## 2021-12-07 NOTE — Progress Notes (Signed)
Allen Davenport presents today for injection per the provider's orders.  Sandostatin 30 mg administration without incident; injection site WNL; see MAR for injection details.  Patient tolerated procedure well and without incident.  No questions or complaints noted at this time.   Discharged from clinic ambulatory in stable condition. Alert and oriented x 3. F/U with [facility] as scheduled.

## 2021-12-07 NOTE — Patient Instructions (Signed)
[facility]  Discharge Instructions: Thank you for choosing [facility] to provide your oncology and hematology care.  If you have a lab appointment with the [facility], please come in thru the Main Entrance and check in at the main information desk.  Wear comfortable clothing and clothing appropriate for easy access to any Portacath or PICC line.   We strive to give you quality time with your provider. You may need to reschedule your appointment if you arrive late (15 or more minutes).  Arriving late affects you and other patients whose appointments are after yours.  Also, if you miss three or more appointments without notifying the office, you may be dismissed from the clinic at the provider's discretion.      For prescription refill requests, have your pharmacy contact our office and allow 72 hours for refills to be completed.    Today you received Sandostatin injection.     BELOW ARE SYMPTOMS THAT SHOULD BE REPORTED IMMEDIATELY: *FEVER GREATER THAN 100.4 F (38 C) OR HIGHER *CHILLS OR SWEATING *NAUSEA AND VOMITING THAT IS NOT CONTROLLED WITH YOUR NAUSEA MEDICATION *UNUSUAL SHORTNESS OF BREATH *UNUSUAL BRUISING OR BLEEDING *URINARY PROBLEMS (pain or burning when urinating, or frequent urination) *BOWEL PROBLEMS (unusual diarrhea, constipation, pain near the anus) TENDERNESS IN MOUTH AND THROAT WITH OR WITHOUT PRESENCE OF ULCERS (sore throat, sores in mouth, or a toothache) UNUSUAL RASH, SWELLING OR PAIN  UNUSUAL VAGINAL DISCHARGE OR ITCHING   Items with * indicate a potential emergency and should be followed up as soon as possible or go to the Emergency Department if any problems should occur.  Please show the CHEMOTHERAPY ALERT CARD or IMMUNOTHERAPY ALERT CARD at check-in to the Emergency Department and triage nurse.  Should you have questions after your visit or need to cancel or reschedule your appointment, please contact [facility]  [phone]  and follow the prompts.  Office hours are 8:00 a.m. to 4:30 p.m. Monday - Friday. Please note that voicemails left after 4:00 p.m. may not be returned until the following business day.  We are closed weekends and major holidays. You have access to a nurse at all times for urgent questions. Please call the main number to the clinic [phone] and follow the prompts.  For any non-urgent questions, you may also contact your provider using MyChart. We now offer e-Visits for anyone 31 and older to request care online for non-urgent symptoms. For details visit [email or web address].   Also download the MyChart app! Go to the app store, search "MyChart", open the app, select [facility], and log in with your MyChart username and password.  Due to Covid, a mask is required upon entering the hospital/clinic. If you do not have a mask, one will be given to you upon arrival. For doctor visits, patients may have 1 support person aged 60 or older with them. For treatment visits, patients cannot have anyone with them due to current Covid guidelines and our immunocompromised population.

## 2021-12-08 DIAGNOSIS — D509 Iron deficiency anemia, unspecified: Secondary | ICD-10-CM | POA: Diagnosis not present

## 2021-12-08 DIAGNOSIS — N186 End stage renal disease: Secondary | ICD-10-CM | POA: Diagnosis not present

## 2021-12-08 DIAGNOSIS — Z992 Dependence on renal dialysis: Secondary | ICD-10-CM | POA: Diagnosis not present

## 2021-12-08 DIAGNOSIS — Z23 Encounter for immunization: Secondary | ICD-10-CM | POA: Diagnosis not present

## 2021-12-08 DIAGNOSIS — N2581 Secondary hyperparathyroidism of renal origin: Secondary | ICD-10-CM | POA: Diagnosis not present

## 2021-12-08 DIAGNOSIS — N250 Renal osteodystrophy: Secondary | ICD-10-CM | POA: Diagnosis not present

## 2021-12-10 DIAGNOSIS — N2581 Secondary hyperparathyroidism of renal origin: Secondary | ICD-10-CM | POA: Diagnosis not present

## 2021-12-10 DIAGNOSIS — D509 Iron deficiency anemia, unspecified: Secondary | ICD-10-CM | POA: Diagnosis not present

## 2021-12-10 DIAGNOSIS — Z23 Encounter for immunization: Secondary | ICD-10-CM | POA: Diagnosis not present

## 2021-12-10 DIAGNOSIS — Z992 Dependence on renal dialysis: Secondary | ICD-10-CM | POA: Diagnosis not present

## 2021-12-10 DIAGNOSIS — N186 End stage renal disease: Secondary | ICD-10-CM | POA: Diagnosis not present

## 2021-12-10 DIAGNOSIS — N250 Renal osteodystrophy: Secondary | ICD-10-CM | POA: Diagnosis not present

## 2021-12-13 DIAGNOSIS — N186 End stage renal disease: Secondary | ICD-10-CM | POA: Diagnosis not present

## 2021-12-13 DIAGNOSIS — N2581 Secondary hyperparathyroidism of renal origin: Secondary | ICD-10-CM | POA: Diagnosis not present

## 2021-12-13 DIAGNOSIS — N250 Renal osteodystrophy: Secondary | ICD-10-CM | POA: Diagnosis not present

## 2021-12-13 DIAGNOSIS — D509 Iron deficiency anemia, unspecified: Secondary | ICD-10-CM | POA: Diagnosis not present

## 2021-12-13 DIAGNOSIS — Z992 Dependence on renal dialysis: Secondary | ICD-10-CM | POA: Diagnosis not present

## 2021-12-13 DIAGNOSIS — Z23 Encounter for immunization: Secondary | ICD-10-CM | POA: Diagnosis not present

## 2021-12-14 DIAGNOSIS — Z992 Dependence on renal dialysis: Secondary | ICD-10-CM | POA: Diagnosis not present

## 2021-12-14 DIAGNOSIS — N186 End stage renal disease: Secondary | ICD-10-CM | POA: Diagnosis not present

## 2021-12-15 DIAGNOSIS — D631 Anemia in chronic kidney disease: Secondary | ICD-10-CM | POA: Diagnosis not present

## 2021-12-15 DIAGNOSIS — Z992 Dependence on renal dialysis: Secondary | ICD-10-CM | POA: Diagnosis not present

## 2021-12-15 DIAGNOSIS — N186 End stage renal disease: Secondary | ICD-10-CM | POA: Diagnosis not present

## 2021-12-15 DIAGNOSIS — E559 Vitamin D deficiency, unspecified: Secondary | ICD-10-CM | POA: Diagnosis not present

## 2021-12-15 DIAGNOSIS — N2581 Secondary hyperparathyroidism of renal origin: Secondary | ICD-10-CM | POA: Diagnosis not present

## 2021-12-15 DIAGNOSIS — N250 Renal osteodystrophy: Secondary | ICD-10-CM | POA: Diagnosis not present

## 2021-12-15 DIAGNOSIS — D509 Iron deficiency anemia, unspecified: Secondary | ICD-10-CM | POA: Diagnosis not present

## 2021-12-17 DIAGNOSIS — D631 Anemia in chronic kidney disease: Secondary | ICD-10-CM | POA: Diagnosis not present

## 2021-12-17 DIAGNOSIS — N2581 Secondary hyperparathyroidism of renal origin: Secondary | ICD-10-CM | POA: Diagnosis not present

## 2021-12-17 DIAGNOSIS — N186 End stage renal disease: Secondary | ICD-10-CM | POA: Diagnosis not present

## 2021-12-17 DIAGNOSIS — D509 Iron deficiency anemia, unspecified: Secondary | ICD-10-CM | POA: Diagnosis not present

## 2021-12-17 DIAGNOSIS — N250 Renal osteodystrophy: Secondary | ICD-10-CM | POA: Diagnosis not present

## 2021-12-19 DIAGNOSIS — M8589 Other specified disorders of bone density and structure, multiple sites: Secondary | ICD-10-CM | POA: Diagnosis not present

## 2021-12-19 DIAGNOSIS — M810 Age-related osteoporosis without current pathological fracture: Secondary | ICD-10-CM | POA: Diagnosis not present

## 2021-12-20 DIAGNOSIS — D631 Anemia in chronic kidney disease: Secondary | ICD-10-CM | POA: Diagnosis not present

## 2021-12-20 DIAGNOSIS — N2581 Secondary hyperparathyroidism of renal origin: Secondary | ICD-10-CM | POA: Diagnosis not present

## 2021-12-20 DIAGNOSIS — N250 Renal osteodystrophy: Secondary | ICD-10-CM | POA: Diagnosis not present

## 2021-12-20 DIAGNOSIS — N186 End stage renal disease: Secondary | ICD-10-CM | POA: Diagnosis not present

## 2021-12-20 DIAGNOSIS — D509 Iron deficiency anemia, unspecified: Secondary | ICD-10-CM | POA: Diagnosis not present

## 2021-12-21 DIAGNOSIS — Z8521 Personal history of malignant neoplasm of larynx: Secondary | ICD-10-CM | POA: Diagnosis not present

## 2021-12-21 DIAGNOSIS — H6121 Impacted cerumen, right ear: Secondary | ICD-10-CM | POA: Diagnosis not present

## 2021-12-21 DIAGNOSIS — R499 Unspecified voice and resonance disorder: Secondary | ICD-10-CM | POA: Diagnosis not present

## 2021-12-22 DIAGNOSIS — N186 End stage renal disease: Secondary | ICD-10-CM | POA: Diagnosis not present

## 2021-12-22 DIAGNOSIS — N2581 Secondary hyperparathyroidism of renal origin: Secondary | ICD-10-CM | POA: Diagnosis not present

## 2021-12-22 DIAGNOSIS — D631 Anemia in chronic kidney disease: Secondary | ICD-10-CM | POA: Diagnosis not present

## 2021-12-22 DIAGNOSIS — N250 Renal osteodystrophy: Secondary | ICD-10-CM | POA: Diagnosis not present

## 2021-12-22 DIAGNOSIS — D509 Iron deficiency anemia, unspecified: Secondary | ICD-10-CM | POA: Diagnosis not present

## 2021-12-24 DIAGNOSIS — D509 Iron deficiency anemia, unspecified: Secondary | ICD-10-CM | POA: Diagnosis not present

## 2021-12-24 DIAGNOSIS — N2581 Secondary hyperparathyroidism of renal origin: Secondary | ICD-10-CM | POA: Diagnosis not present

## 2021-12-24 DIAGNOSIS — N186 End stage renal disease: Secondary | ICD-10-CM | POA: Diagnosis not present

## 2021-12-24 DIAGNOSIS — N250 Renal osteodystrophy: Secondary | ICD-10-CM | POA: Diagnosis not present

## 2021-12-24 DIAGNOSIS — D631 Anemia in chronic kidney disease: Secondary | ICD-10-CM | POA: Diagnosis not present

## 2021-12-27 DIAGNOSIS — N250 Renal osteodystrophy: Secondary | ICD-10-CM | POA: Diagnosis not present

## 2021-12-27 DIAGNOSIS — N186 End stage renal disease: Secondary | ICD-10-CM | POA: Diagnosis not present

## 2021-12-27 DIAGNOSIS — D631 Anemia in chronic kidney disease: Secondary | ICD-10-CM | POA: Diagnosis not present

## 2021-12-27 DIAGNOSIS — D509 Iron deficiency anemia, unspecified: Secondary | ICD-10-CM | POA: Diagnosis not present

## 2021-12-27 DIAGNOSIS — N2581 Secondary hyperparathyroidism of renal origin: Secondary | ICD-10-CM | POA: Diagnosis not present

## 2021-12-29 DIAGNOSIS — D631 Anemia in chronic kidney disease: Secondary | ICD-10-CM | POA: Diagnosis not present

## 2021-12-29 DIAGNOSIS — N2581 Secondary hyperparathyroidism of renal origin: Secondary | ICD-10-CM | POA: Diagnosis not present

## 2021-12-29 DIAGNOSIS — D509 Iron deficiency anemia, unspecified: Secondary | ICD-10-CM | POA: Diagnosis not present

## 2021-12-29 DIAGNOSIS — N250 Renal osteodystrophy: Secondary | ICD-10-CM | POA: Diagnosis not present

## 2021-12-29 DIAGNOSIS — N186 End stage renal disease: Secondary | ICD-10-CM | POA: Diagnosis not present

## 2021-12-31 DIAGNOSIS — D631 Anemia in chronic kidney disease: Secondary | ICD-10-CM | POA: Diagnosis not present

## 2021-12-31 DIAGNOSIS — N250 Renal osteodystrophy: Secondary | ICD-10-CM | POA: Diagnosis not present

## 2021-12-31 DIAGNOSIS — D509 Iron deficiency anemia, unspecified: Secondary | ICD-10-CM | POA: Diagnosis not present

## 2021-12-31 DIAGNOSIS — N2581 Secondary hyperparathyroidism of renal origin: Secondary | ICD-10-CM | POA: Diagnosis not present

## 2021-12-31 DIAGNOSIS — N186 End stage renal disease: Secondary | ICD-10-CM | POA: Diagnosis not present

## 2022-01-03 DIAGNOSIS — D509 Iron deficiency anemia, unspecified: Secondary | ICD-10-CM | POA: Diagnosis not present

## 2022-01-03 DIAGNOSIS — N2581 Secondary hyperparathyroidism of renal origin: Secondary | ICD-10-CM | POA: Diagnosis not present

## 2022-01-03 DIAGNOSIS — N186 End stage renal disease: Secondary | ICD-10-CM | POA: Diagnosis not present

## 2022-01-03 DIAGNOSIS — N250 Renal osteodystrophy: Secondary | ICD-10-CM | POA: Diagnosis not present

## 2022-01-03 DIAGNOSIS — D631 Anemia in chronic kidney disease: Secondary | ICD-10-CM | POA: Diagnosis not present

## 2022-01-05 DIAGNOSIS — N2581 Secondary hyperparathyroidism of renal origin: Secondary | ICD-10-CM | POA: Diagnosis not present

## 2022-01-05 DIAGNOSIS — N186 End stage renal disease: Secondary | ICD-10-CM | POA: Diagnosis not present

## 2022-01-05 DIAGNOSIS — D509 Iron deficiency anemia, unspecified: Secondary | ICD-10-CM | POA: Diagnosis not present

## 2022-01-05 DIAGNOSIS — N250 Renal osteodystrophy: Secondary | ICD-10-CM | POA: Diagnosis not present

## 2022-01-05 DIAGNOSIS — D631 Anemia in chronic kidney disease: Secondary | ICD-10-CM | POA: Diagnosis not present

## 2022-01-06 ENCOUNTER — Encounter (HOSPITAL_COMMUNITY): Payer: Medicare Other

## 2022-01-07 DIAGNOSIS — N250 Renal osteodystrophy: Secondary | ICD-10-CM | POA: Diagnosis not present

## 2022-01-07 DIAGNOSIS — D509 Iron deficiency anemia, unspecified: Secondary | ICD-10-CM | POA: Diagnosis not present

## 2022-01-07 DIAGNOSIS — N186 End stage renal disease: Secondary | ICD-10-CM | POA: Diagnosis not present

## 2022-01-07 DIAGNOSIS — N2581 Secondary hyperparathyroidism of renal origin: Secondary | ICD-10-CM | POA: Diagnosis not present

## 2022-01-07 DIAGNOSIS — D631 Anemia in chronic kidney disease: Secondary | ICD-10-CM | POA: Diagnosis not present

## 2022-01-09 ENCOUNTER — Ambulatory Visit (HOSPITAL_COMMUNITY): Payer: Medicare Other | Admitting: Hematology

## 2022-01-09 ENCOUNTER — Ambulatory Visit (HOSPITAL_COMMUNITY): Payer: Medicare Other

## 2022-01-09 DIAGNOSIS — E1151 Type 2 diabetes mellitus with diabetic peripheral angiopathy without gangrene: Secondary | ICD-10-CM | POA: Diagnosis not present

## 2022-01-09 DIAGNOSIS — E1140 Type 2 diabetes mellitus with diabetic neuropathy, unspecified: Secondary | ICD-10-CM | POA: Diagnosis not present

## 2022-01-10 DIAGNOSIS — D509 Iron deficiency anemia, unspecified: Secondary | ICD-10-CM | POA: Diagnosis not present

## 2022-01-10 DIAGNOSIS — N2581 Secondary hyperparathyroidism of renal origin: Secondary | ICD-10-CM | POA: Diagnosis not present

## 2022-01-10 DIAGNOSIS — D631 Anemia in chronic kidney disease: Secondary | ICD-10-CM | POA: Diagnosis not present

## 2022-01-10 DIAGNOSIS — N186 End stage renal disease: Secondary | ICD-10-CM | POA: Diagnosis not present

## 2022-01-10 DIAGNOSIS — N250 Renal osteodystrophy: Secondary | ICD-10-CM | POA: Diagnosis not present

## 2022-01-11 ENCOUNTER — Ambulatory Visit (HOSPITAL_COMMUNITY): Payer: Medicare Other

## 2022-01-11 ENCOUNTER — Other Ambulatory Visit (HOSPITAL_COMMUNITY): Payer: Medicare Other

## 2022-01-11 ENCOUNTER — Ambulatory Visit (HOSPITAL_COMMUNITY): Payer: Medicare Other | Admitting: Hematology

## 2022-01-12 DIAGNOSIS — D631 Anemia in chronic kidney disease: Secondary | ICD-10-CM | POA: Diagnosis not present

## 2022-01-12 DIAGNOSIS — N250 Renal osteodystrophy: Secondary | ICD-10-CM | POA: Diagnosis not present

## 2022-01-12 DIAGNOSIS — D509 Iron deficiency anemia, unspecified: Secondary | ICD-10-CM | POA: Diagnosis not present

## 2022-01-12 DIAGNOSIS — N186 End stage renal disease: Secondary | ICD-10-CM | POA: Diagnosis not present

## 2022-01-12 DIAGNOSIS — N2581 Secondary hyperparathyroidism of renal origin: Secondary | ICD-10-CM | POA: Diagnosis not present

## 2022-01-13 ENCOUNTER — Encounter (HOSPITAL_COMMUNITY): Admission: RE | Admit: 2022-01-13 | Payer: Medicare Other | Source: Ambulatory Visit

## 2022-01-13 DIAGNOSIS — N186 End stage renal disease: Secondary | ICD-10-CM | POA: Diagnosis not present

## 2022-01-13 DIAGNOSIS — Z992 Dependence on renal dialysis: Secondary | ICD-10-CM | POA: Diagnosis not present

## 2022-01-14 DIAGNOSIS — D631 Anemia in chronic kidney disease: Secondary | ICD-10-CM | POA: Diagnosis not present

## 2022-01-14 DIAGNOSIS — N186 End stage renal disease: Secondary | ICD-10-CM | POA: Diagnosis not present

## 2022-01-14 DIAGNOSIS — N2581 Secondary hyperparathyroidism of renal origin: Secondary | ICD-10-CM | POA: Diagnosis not present

## 2022-01-14 DIAGNOSIS — E559 Vitamin D deficiency, unspecified: Secondary | ICD-10-CM | POA: Diagnosis not present

## 2022-01-14 DIAGNOSIS — D509 Iron deficiency anemia, unspecified: Secondary | ICD-10-CM | POA: Diagnosis not present

## 2022-01-14 DIAGNOSIS — Z992 Dependence on renal dialysis: Secondary | ICD-10-CM | POA: Diagnosis not present

## 2022-01-14 DIAGNOSIS — N250 Renal osteodystrophy: Secondary | ICD-10-CM | POA: Diagnosis not present

## 2022-01-17 DIAGNOSIS — D509 Iron deficiency anemia, unspecified: Secondary | ICD-10-CM | POA: Diagnosis not present

## 2022-01-17 DIAGNOSIS — N2581 Secondary hyperparathyroidism of renal origin: Secondary | ICD-10-CM | POA: Diagnosis not present

## 2022-01-17 DIAGNOSIS — N186 End stage renal disease: Secondary | ICD-10-CM | POA: Diagnosis not present

## 2022-01-17 DIAGNOSIS — N250 Renal osteodystrophy: Secondary | ICD-10-CM | POA: Diagnosis not present

## 2022-01-17 DIAGNOSIS — D631 Anemia in chronic kidney disease: Secondary | ICD-10-CM | POA: Diagnosis not present

## 2022-01-17 DIAGNOSIS — Z992 Dependence on renal dialysis: Secondary | ICD-10-CM | POA: Diagnosis not present

## 2022-01-19 DIAGNOSIS — N250 Renal osteodystrophy: Secondary | ICD-10-CM | POA: Diagnosis not present

## 2022-01-19 DIAGNOSIS — N2581 Secondary hyperparathyroidism of renal origin: Secondary | ICD-10-CM | POA: Diagnosis not present

## 2022-01-19 DIAGNOSIS — Z992 Dependence on renal dialysis: Secondary | ICD-10-CM | POA: Diagnosis not present

## 2022-01-19 DIAGNOSIS — D509 Iron deficiency anemia, unspecified: Secondary | ICD-10-CM | POA: Diagnosis not present

## 2022-01-19 DIAGNOSIS — N186 End stage renal disease: Secondary | ICD-10-CM | POA: Diagnosis not present

## 2022-01-19 DIAGNOSIS — D631 Anemia in chronic kidney disease: Secondary | ICD-10-CM | POA: Diagnosis not present

## 2022-01-21 DIAGNOSIS — D631 Anemia in chronic kidney disease: Secondary | ICD-10-CM | POA: Diagnosis not present

## 2022-01-21 DIAGNOSIS — N250 Renal osteodystrophy: Secondary | ICD-10-CM | POA: Diagnosis not present

## 2022-01-21 DIAGNOSIS — N2581 Secondary hyperparathyroidism of renal origin: Secondary | ICD-10-CM | POA: Diagnosis not present

## 2022-01-21 DIAGNOSIS — D509 Iron deficiency anemia, unspecified: Secondary | ICD-10-CM | POA: Diagnosis not present

## 2022-01-21 DIAGNOSIS — Z992 Dependence on renal dialysis: Secondary | ICD-10-CM | POA: Diagnosis not present

## 2022-01-21 DIAGNOSIS — N186 End stage renal disease: Secondary | ICD-10-CM | POA: Diagnosis not present

## 2022-01-23 ENCOUNTER — Ambulatory Visit (HOSPITAL_COMMUNITY): Payer: Medicare Other

## 2022-01-23 ENCOUNTER — Ambulatory Visit (HOSPITAL_COMMUNITY): Payer: Medicare Other | Admitting: Hematology

## 2022-01-23 ENCOUNTER — Other Ambulatory Visit (HOSPITAL_COMMUNITY): Payer: Medicare Other

## 2022-01-24 DIAGNOSIS — D631 Anemia in chronic kidney disease: Secondary | ICD-10-CM | POA: Diagnosis not present

## 2022-01-24 DIAGNOSIS — Z992 Dependence on renal dialysis: Secondary | ICD-10-CM | POA: Diagnosis not present

## 2022-01-24 DIAGNOSIS — N186 End stage renal disease: Secondary | ICD-10-CM | POA: Diagnosis not present

## 2022-01-24 DIAGNOSIS — N250 Renal osteodystrophy: Secondary | ICD-10-CM | POA: Diagnosis not present

## 2022-01-24 DIAGNOSIS — D509 Iron deficiency anemia, unspecified: Secondary | ICD-10-CM | POA: Diagnosis not present

## 2022-01-24 DIAGNOSIS — N2581 Secondary hyperparathyroidism of renal origin: Secondary | ICD-10-CM | POA: Diagnosis not present

## 2022-01-26 DIAGNOSIS — D509 Iron deficiency anemia, unspecified: Secondary | ICD-10-CM | POA: Diagnosis not present

## 2022-01-26 DIAGNOSIS — Z992 Dependence on renal dialysis: Secondary | ICD-10-CM | POA: Diagnosis not present

## 2022-01-26 DIAGNOSIS — N250 Renal osteodystrophy: Secondary | ICD-10-CM | POA: Diagnosis not present

## 2022-01-26 DIAGNOSIS — N2581 Secondary hyperparathyroidism of renal origin: Secondary | ICD-10-CM | POA: Diagnosis not present

## 2022-01-26 DIAGNOSIS — N186 End stage renal disease: Secondary | ICD-10-CM | POA: Diagnosis not present

## 2022-01-26 DIAGNOSIS — D631 Anemia in chronic kidney disease: Secondary | ICD-10-CM | POA: Diagnosis not present

## 2022-01-27 ENCOUNTER — Ambulatory Visit (HOSPITAL_COMMUNITY)
Admission: RE | Admit: 2022-01-27 | Discharge: 2022-01-27 | Disposition: A | Discharge status: Home / Self Care | Payer: Medicare Other | Source: Ambulatory Visit | Attending: Hematology | Admitting: Hematology

## 2022-01-27 DIAGNOSIS — C787 Secondary malignant neoplasm of liver and intrahepatic bile duct: Secondary | ICD-10-CM | POA: Diagnosis not present

## 2022-01-27 DIAGNOSIS — C786 Secondary malignant neoplasm of retroperitoneum and peritoneum: Secondary | ICD-10-CM | POA: Diagnosis not present

## 2022-01-27 DIAGNOSIS — C7A8 Other malignant neuroendocrine tumors: Secondary | ICD-10-CM | POA: Insufficient documentation

## 2022-01-27 DIAGNOSIS — C7A00 Malignant carcinoid tumor of unspecified site: Secondary | ICD-10-CM | POA: Insufficient documentation

## 2022-01-27 MED ORDER — COPPER CU 64 DOTATATE 1 MCI/ML IV SOLN
4.0000 | Freq: Once | INTRAVENOUS | Status: AC
  Administered 2022-01-27: 3.9 via INTRAVENOUS

## 2022-01-28 DIAGNOSIS — N2581 Secondary hyperparathyroidism of renal origin: Secondary | ICD-10-CM | POA: Diagnosis not present

## 2022-01-28 DIAGNOSIS — N186 End stage renal disease: Secondary | ICD-10-CM | POA: Diagnosis not present

## 2022-01-28 DIAGNOSIS — D509 Iron deficiency anemia, unspecified: Secondary | ICD-10-CM | POA: Diagnosis not present

## 2022-01-28 DIAGNOSIS — Z992 Dependence on renal dialysis: Secondary | ICD-10-CM | POA: Diagnosis not present

## 2022-01-28 DIAGNOSIS — N250 Renal osteodystrophy: Secondary | ICD-10-CM | POA: Diagnosis not present

## 2022-01-28 DIAGNOSIS — D631 Anemia in chronic kidney disease: Secondary | ICD-10-CM | POA: Diagnosis not present

## 2022-01-31 DIAGNOSIS — N250 Renal osteodystrophy: Secondary | ICD-10-CM | POA: Diagnosis not present

## 2022-01-31 DIAGNOSIS — Z992 Dependence on renal dialysis: Secondary | ICD-10-CM | POA: Diagnosis not present

## 2022-01-31 DIAGNOSIS — D509 Iron deficiency anemia, unspecified: Secondary | ICD-10-CM | POA: Diagnosis not present

## 2022-01-31 DIAGNOSIS — N2581 Secondary hyperparathyroidism of renal origin: Secondary | ICD-10-CM | POA: Diagnosis not present

## 2022-01-31 DIAGNOSIS — D631 Anemia in chronic kidney disease: Secondary | ICD-10-CM | POA: Diagnosis not present

## 2022-01-31 DIAGNOSIS — N186 End stage renal disease: Secondary | ICD-10-CM | POA: Diagnosis not present

## 2022-02-01 ENCOUNTER — Inpatient Hospital Stay (HOSPITAL_COMMUNITY): Payer: Medicare Other

## 2022-02-01 ENCOUNTER — Inpatient Hospital Stay (HOSPITAL_COMMUNITY): Payer: Medicare Other | Attending: Hematology

## 2022-02-01 ENCOUNTER — Inpatient Hospital Stay (HOSPITAL_BASED_OUTPATIENT_CLINIC_OR_DEPARTMENT_OTHER): Payer: Medicare Other | Admitting: Hematology

## 2022-02-01 VITALS — BP 106/62 | HR 66 | Temp 98.5°F | Resp 17 | Ht 77.0 in | Wt 184.0 lb

## 2022-02-01 DIAGNOSIS — Z992 Dependence on renal dialysis: Secondary | ICD-10-CM | POA: Insufficient documentation

## 2022-02-01 DIAGNOSIS — C7A00 Malignant carcinoid tumor of unspecified site: Secondary | ICD-10-CM

## 2022-02-01 DIAGNOSIS — N186 End stage renal disease: Secondary | ICD-10-CM | POA: Diagnosis not present

## 2022-02-01 DIAGNOSIS — Z79899 Other long term (current) drug therapy: Secondary | ICD-10-CM | POA: Insufficient documentation

## 2022-02-01 DIAGNOSIS — D649 Anemia, unspecified: Secondary | ICD-10-CM | POA: Diagnosis not present

## 2022-02-01 DIAGNOSIS — C7B02 Secondary carcinoid tumors of liver: Secondary | ICD-10-CM | POA: Insufficient documentation

## 2022-02-01 LAB — COMPREHENSIVE METABOLIC PANEL
ALT: 16 U/L (ref 0–44)
AST: 29 U/L (ref 15–41)
Albumin: 3.2 g/dL — ABNORMAL LOW (ref 3.5–5.0)
Alkaline Phosphatase: 60 U/L (ref 38–126)
Anion gap: 9 (ref 5–15)
BUN: 33 mg/dL — ABNORMAL HIGH (ref 8–23)
CO2: 29 mmol/L (ref 22–32)
Calcium: 8.2 mg/dL — ABNORMAL LOW (ref 8.9–10.3)
Chloride: 99 mmol/L (ref 98–111)
Creatinine, Ser: 6.02 mg/dL — ABNORMAL HIGH (ref 0.61–1.24)
GFR, Estimated: 9 mL/min — ABNORMAL LOW (ref >60)
Glucose, Bld: 166 mg/dL — ABNORMAL HIGH (ref 70–99)
Potassium: 3.3 mmol/L — ABNORMAL LOW (ref 3.5–5.1)
Sodium: 137 mmol/L (ref 135–145)
Total Bilirubin: 0.9 mg/dL (ref 0.3–1.2)
Total Protein: 5.7 g/dL — ABNORMAL LOW (ref 6.5–8.1)

## 2022-02-01 LAB — CBC WITH DIFFERENTIAL/PLATELET
Abs Immature Granulocytes: 0.01 10*3/uL (ref 0.00–0.07)
Basophils Absolute: 0 10*3/uL (ref 0.0–0.1)
Basophils Relative: 0 %
Eosinophils Absolute: 0.1 10*3/uL (ref 0.0–0.5)
Eosinophils Relative: 2 %
HCT: 30.9 % — ABNORMAL LOW (ref 39.0–52.0)
Hemoglobin: 9.9 g/dL — ABNORMAL LOW (ref 13.0–17.0)
Immature Granulocytes: 0 %
Lymphocytes Relative: 10 %
Lymphs Abs: 0.3 10*3/uL — ABNORMAL LOW (ref 0.7–4.0)
MCH: 27 pg (ref 26.0–34.0)
MCHC: 32 g/dL (ref 30.0–36.0)
MCV: 84.4 fL (ref 80.0–100.0)
Monocytes Absolute: 0.4 10*3/uL (ref 0.1–1.0)
Monocytes Relative: 12 %
Neutro Abs: 2.3 10*3/uL (ref 1.7–7.7)
Neutrophils Relative %: 76 %
Platelets: 90 10*3/uL — ABNORMAL LOW (ref 150–400)
RBC: 3.66 MIL/uL — ABNORMAL LOW (ref 4.22–5.81)
RDW: 14.7 % (ref 11.5–15.5)
WBC: 3 10*3/uL — ABNORMAL LOW (ref 4.0–10.5)
nRBC: 0 % (ref 0.0–0.2)

## 2022-02-01 NOTE — Patient Instructions (Addendum)
[place] at [facility] Discharge Instructions   You were seen and examined today by Dr. Delton Coombes.  He reviewed the results of your PET scan which shows that the cancer in your liver has gotten bigger. The Afinitor you are taking has stopped working. You may finish the bottle you have and stop taking it. We will need to change treatment.   We will obtain a biopsy of the enlarged liver mass to see what treatment options may be available to you. We will send special testing on the biopsy to determine this.   Return as scheduled.    Thank you for choosing [place] at [facility] to provide your oncology and hematology care.  To afford each patient quality time with our provider, please arrive at least 15 minutes before your scheduled appointment time.   If you have a lab appointment with the [facility] please come in thru the Main Entrance and check in at the main information desk.  You need to re-schedule your appointment should you arrive 10 or more minutes late.  We strive to give you quality time with our providers, and arriving late affects you and other patients whose appointments are after yours.  Also, if you no show three or more times for appointments you may be dismissed from the clinic at the providers discretion.     Again, thank you for choosing [facility].  Our hope is that these requests will decrease the amount of time that you wait before being seen by our physicians.       _____________________________________________________________  Should you have questions after your visit to [facility], please contact our office at [phone] and follow the prompts.  Our office hours are 8:00 a.m. and 4:30 p.m. Monday - Friday.  Please note that voicemails left after 4:00 p.m. may not be returned until the following business day.  We are closed weekends and major holidays.  You do have access to a nurse  24-7, just call the main number to the clinic [phone] and do not press any options, hold on the line and a nurse will answer the phone.    For prescription refill requests, have your pharmacy contact our office and allow 72 hours.    Due to Covid, you will need to wear a mask upon entering the hospital. If you do not have a mask, a mask will be given to you at the Main Entrance upon arrival. For doctor visits, patients may have 1 support person age 55 or older with them. For treatment visits, patients can not have anyone with them due to social distancing guidelines and our immunocompromised population.

## 2022-02-01 NOTE — Progress Notes (Signed)
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    CANCER STAGING: Cancer Staging  No matching staging information was found for the patient.  INTERVAL HISTORY:  [name], a 81 y.o. male, returns for routine follow-up of his metastatic carcinoid tumor of small intestine. Allen Davenport was last seen on 09/14/2021.   Today he reports feeling good. He denies abdominal pain, flushing, wheezing, and diarrhea. He has continued to take everolimus. His appetite is good.  REVIEW OF SYSTEMS:  Review of Systems  Constitutional:  Negative for appetite change and fatigue.  Respiratory:  Negative for wheezing.   Gastrointestinal:  Negative for abdominal pain and diarrhea.  All other systems reviewed and are negative.   PAST MEDICAL/SURGICAL HISTORY:  Past Medical History:  Diagnosis Date   Anemia    Cancer ([place])    right renal . Prostate- Radiation treatment   CHF (congestive heart failure) (HCC)    Chronic kidney disease    Dialysis T/TH/Sa   Edema    Heart murmur    "nothing to worry about"   Hyperlipidemia    Hypertension    Malignant carcinoid tumor ([place]) 01/03/2016   Pneumonia    as a child   Past Surgical History:  Procedure Laterality Date   AV FISTULA PLACEMENT Left 10/20/2014   Procedure: Left Arm ARTERIOVENOUS (AV) FISTULA CREATION;  Surgeon: Elam Dutch, MD;  Location: [facility];  Service: Vascular;  Laterality: Left;   NEPHRECTOMY Right 2010   PERIPHERAL VASCULAR BALLOON ANGIOPLASTY  06/20/2019   Procedure: PERIPHERAL VASCULAR BALLOON ANGIOPLASTY;  Surgeon: Elam Dutch, MD;  Location: [facility];  Service: Cardiovascular;;   REVISON OF ARTERIOVENOUS FISTULA Left 08/04/2019   Procedure: REVISON OF ARTERIOVENOUS FISTULA WITH SIDE BRANCH LIGATION;  Surgeon: Elam Dutch, MD;  Location: [facility] OR;  Service: Vascular;  Laterality: Left;    SOCIAL HISTORY:  Social History   Socioeconomic History   Marital status: Married    Spouse name: Not on file   Number of children: Not on file   Years of education: Not on file   Highest education level: Not on file  Occupational History   Not on file  Tobacco Use   Smoking status: Never   Smokeless tobacco: Never  Vaping Use   Vaping Use: Never used  Substance and Sexual Activity   Alcohol use: No    Alcohol/week: 0.0 standard drinks of alcohol   Drug use: No   Sexual activity: Not on file  Other Topics Concern   Not on file  Social History Narrative   Not on file   Social Determinants of Health   Financial Resource Strain: Low Risk  (05/24/2020)   Overall Financial Resource Strain (CARDIA)    Difficulty of Paying Living Expenses: Not hard at all  Food Insecurity: No Food Insecurity (05/24/2020)   Hunger Vital Sign    Worried About Running Out of Food in the Last Year: Never true    Ran Out of Food in the Last Year: Never true  Transportation Needs: No Transportation Needs (05/24/2020)  PRAPARE - Hydrologist (Medical): No    Lack of Transportation (Non-Medical): No  Physical Activity: Insufficiently Active (05/24/2020)   Exercise Vital Sign    Days of Exercise per Week: 7 days    Minutes of Exercise per Session: 10 min  Stress: No Stress Concern Present (05/24/2020)   [place]    Feeling of Stress : Not at all  Social Connections: Moderately Integrated (05/24/2020)   Social Connection and Isolation Panel [NHANES]    Frequency of Communication with Friends and Family: More than three times a week     Frequency of Social Gatherings with Friends and Family: Once a week    Attends Religious Services: More than 4 times per year    Active Member of [employer] or Organizations: No    Attends Archivist Meetings: Never    Marital Status: Married  Human resources officer Violence: Not At Risk (05/24/2020)   Humiliation, Afraid, Rape, and Kick questionnaire    Fear of Current or Ex-Partner: No    Emotionally Abused: No    Physically Abused: No    Sexually Abused: No    FAMILY HISTORY:  Family History  Problem Relation Age of Onset   Cancer Mother    Hypertension Father    Cancer Sister     CURRENT MEDICATIONS:  Current Outpatient Medications  Medication Sig Dispense Refill   atorvastatin (LIPITOR) 80 MG tablet Take 80 mg by mouth daily at 6 PM.      calcium acetate (PHOSLO) 667 MG capsule Take by mouth.     carvedilol (COREG) 6.25 MG tablet Take 6.25 mg by mouth 2 (two) times daily.     everolimus (AFINITOR) 5 MG tablet Take 1 tablet (5 mg total) by mouth daily. 28 tablet 11   furosemide (LASIX) 40 MG tablet Take 40 mg by mouth every morning.     octreotide (SANDOSTATIN LAR) 30 MG injection Inject 30 mg into the muscle every 28 (twenty-eight) days.      Omega-3 Fatty Acids (FISH OIL) 1000 MG CAPS Take 1,000 mg by mouth daily.      pyridOXINE (B-6) 50 MG tablet Take 1 tablet (50 mg total) by mouth daily. 30 tablet 8   sodium bicarbonate 650 MG tablet Take 650 mg by mouth daily.     No current facility-administered medications for this visit.    ALLERGIES:  No Known Allergies  PHYSICAL EXAM:  Performance status (ECOG): 1 - Symptomatic but completely ambulatory  There were no vitals filed for this visit. Wt Readings from Last 3 Encounters:  11/09/21 187 lb 3.2 oz (84.9 kg)  10/12/21 186 lb 9.6 oz (84.6 kg)  09/14/21 186 lb (84.4 kg)   Physical Exam Vitals reviewed.  Constitutional:      Appearance: Normal appearance.  Cardiovascular:     Rate and Rhythm: Normal rate and  regular rhythm.     Pulses: Normal pulses.     Heart sounds: Normal heart sounds.  Pulmonary:     Effort: Pulmonary effort is normal.     Breath sounds: Normal breath sounds.  Abdominal:     Palpations: Abdomen is soft. There is no hepatomegaly, splenomegaly or mass.     Tenderness: There is no abdominal tenderness.  Neurological:     General: No focal deficit present.     Mental Status: He is alert and oriented to person, place, and time.  Psychiatric:  Mood and Affect: Mood normal.        Behavior: Behavior normal.      LABORATORY DATA:  I have reviewed the labs as listed.     Latest Ref Rng & Units 02/01/2022    8:09 AM 09/12/2021   10:12 AM 07/20/2021   10:24 AM  CBC  WBC 4.0 - 10.5 K/uL 3.0  3.6  3.5   Hemoglobin 13.0 - 17.0 g/dL 9.9  10.9  10.7   Hematocrit 39.0 - 52.0 % 30.9  34.1  32.6   Platelets 150 - 400 K/uL 90  103  112       Latest Ref Rng & Units 09/12/2021   10:12 AM 07/20/2021   10:24 AM 06/22/2021   11:03 AM  CMP  Glucose 70 - 99 mg/dL 86  146  74   BUN 8 - 23 mg/dL 54  42  44   Creatinine 0.61 - 1.24 mg/dL 8.11  6.65  7.21   Sodium 135 - 145 mmol/L 133  136  137   Potassium 3.5 - 5.1 mmol/L 3.9  3.5  3.7   Chloride 98 - 111 mmol/L 98  99  98   CO2 22 - 32 mmol/L '29  28  29   '$ Calcium 8.9 - 10.3 mg/dL 8.4  8.5  8.7   Total Protein 6.5 - 8.1 g/dL 6.0  6.1  6.2   Total Bilirubin 0.3 - 1.2 mg/dL 0.6  0.4  0.5   Alkaline Phos 38 - 126 U/L 80  73  75   AST 15 - 41 U/L 29  26  32   ALT 0 - 44 U/L '17  16  21     '$ DIAGNOSTIC IMAGING:  I have independently reviewed the scans and discussed with the patient. NM PET (CU-64 DETECTNET)SKULL TO MID THIGH  Result Date: 01/27/2022 CLINICAL DATA:  Metastatic neuroendocrine tumor. Liver and peritoneal metastasis. EXAM: NUCLEAR MEDICINE PET SKULL BASE TO THIGH TECHNIQUE: 3.9 mCi copper 63 DOTATATE was injected intravenously. Full-ring PET imaging was performed from the skull base to thigh after the radiotracer. CT  data was obtained and used for attenuation correction and anatomic localization. COMPARISON:  None Available. FINDINGS: NECK No radiotracer activity in neck lymph nodes. Incidental CT findings: None CHEST No radiotracer accumulation within mediastinal or hilar lymph nodes. No suspicious pulmonary nodules on the CT scan. Incidental CT finding:None ABDOMEN/PELVIS Multifocal intensely radiotracer avid metastatic lesions in the liver. Lesion of concern in central LEFT hepatic lobe on most recent comparison DOTATATE scan is clearly increased in the interval. Lesion measures 31 mm (image 101/4) compared to 15 mm with SUV max equal 37 compared SUV max equal 26. (Fused image 100). Cluster of lesions in the dome liver/RIGHT hepatic capsule with SUV max equal 23.8 compared SUV max equal 20.7 (image 90). Lesion the posterior RIGHT hepatic lobe SUV max equal 18 (image 108) increased from SUV max equal 9. Lesion appears slightly increased in size although difficult to measure on noncontrast CT measuring 11 mm compared 8 mm. Again demonstrated radiotracer avid mesenteric mass in the RIGHT upper quadrant measuring 26 mm with SUV max equal 27 compared to 25 mm with SUV max equal 16. Intense metabolic activity associated peritoneal metastasis the level the umbilicus measures 31 mm with SUV max equal 27 compared to 25 mm with SUV max equal 17. peritoneal implant along the RIGHT common iliac vessel with SUV max equal 25 compared SUV max equal 19 measuring 17 mm on image  161) Large peritoneal implant posterior cul-de-sac measuring 35 mm compared to 31 mm with SUV max equal 42 radius max equal 46 Physiologic activity noted in the liver, spleen, adrenal glands and kidneys. Incidental CT findings:None SKELETON No focal activity to suggest skeletal metastasis. Incidental CT findings:None IMPRESSION: 1. Clear progression of hepatic metastatic well differentiated tumor within a large central LEFT hepatic lobe lesion. Multiple additional  smaller metastatic lesions are increased in activity. 2. Relatively stable multifocal peritoneal and mesenteric metastasis with some increase in radiotracer activity. 3. No new metastatic disease. Electronically Signed   By: Suzy Bouchard M.D.   On: 01/27/2022 14:45     ASSESSMENT:  1.  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  Everolimus 5 mg daily started on 05/17/2018. -PET scan on 10/29/2019 showed multifocal well-differentiated neuroendocrine tumor with intense avid lesions in the liver, mesentery, peritoneal space.  No clear increase in size of the lesions.  Many lesions have increased in radiotracer activity. -PET scan on 09/08/2020 showed no change in multifocal neuroendocrine tumor hepatic metastasis with no changes in multifocal mesenteric and peritoneal nodule or metastatic tumors.  Resolution of the right lung base pulmonary nodule.  No evidence of new metastatic disease.   2.  Normocytic anemia: -This is from combination of end-stage renal disease and relative iron deficiency and myelosuppression from everolimus.   3.  ESRD on HD: -Started on HD on 05/20/2019   PLAN:  1.  Malignant carcinoid tumor to the liver and peritoneum: - We discussed Detectnet PET scan findings which showed clear evidence of progression.  He does not have any carcinoid syndrome symptoms. - I have recommended discontinue everolimus. - I will hold off on lanreotide today. - We will need to check into to see PRRT can be done in dialysis patients. - I have recommended biopsy of the liver lesion for NGS testing. - CAPTEM regimen has not been utilized in dialysis patients. - We will automatically send liver biopsy specimen for NGS testing and see him after it results.   2.  Normocytic anemia: - Continue Retacrit with hemodialysis.  Hemoglobin is 9.9.   3.  ESRD on HD: - Continue HD on Tuesday, Thursday and Saturday.   4.  High risk drug  monitoring: - No mucositis or HFSR.   Orders placed this encounter:  No orders of the defined types were placed in this encounter.    Derek Jack, MD [place] [phone]   I, Thana Ates, am acting as a scribe for Dr. Derek Jack.  I, Derek Jack MD, have reviewed the above documentation for accuracy and completeness, and I agree with the above.

## 2022-02-01 NOTE — Progress Notes (Signed)
No treatment today.

## 2022-02-02 DIAGNOSIS — Z992 Dependence on renal dialysis: Secondary | ICD-10-CM | POA: Diagnosis not present

## 2022-02-02 DIAGNOSIS — N250 Renal osteodystrophy: Secondary | ICD-10-CM | POA: Diagnosis not present

## 2022-02-02 DIAGNOSIS — N2581 Secondary hyperparathyroidism of renal origin: Secondary | ICD-10-CM | POA: Diagnosis not present

## 2022-02-02 DIAGNOSIS — N186 End stage renal disease: Secondary | ICD-10-CM | POA: Diagnosis not present

## 2022-02-02 DIAGNOSIS — D509 Iron deficiency anemia, unspecified: Secondary | ICD-10-CM | POA: Diagnosis not present

## 2022-02-02 DIAGNOSIS — D631 Anemia in chronic kidney disease: Secondary | ICD-10-CM | POA: Diagnosis not present

## 2022-02-02 LAB — CHROMOGRANIN A: Chromogranin A (ng/mL): 844.6 ng/mL — ABNORMAL HIGH (ref 0.0–101.8)

## 2022-02-03 NOTE — Progress Notes (Unsigned)
Sandi Mariscal, MD  Donita Brooks D OK for either US guided liver lesion or US guided periumbilical lesion biopsy.

## 2022-02-04 DIAGNOSIS — D631 Anemia in chronic kidney disease: Secondary | ICD-10-CM | POA: Diagnosis not present

## 2022-02-04 DIAGNOSIS — N186 End stage renal disease: Secondary | ICD-10-CM | POA: Diagnosis not present

## 2022-02-04 DIAGNOSIS — Z992 Dependence on renal dialysis: Secondary | ICD-10-CM | POA: Diagnosis not present

## 2022-02-04 DIAGNOSIS — N2581 Secondary hyperparathyroidism of renal origin: Secondary | ICD-10-CM | POA: Diagnosis not present

## 2022-02-04 DIAGNOSIS — D509 Iron deficiency anemia, unspecified: Secondary | ICD-10-CM | POA: Diagnosis not present

## 2022-02-04 DIAGNOSIS — N250 Renal osteodystrophy: Secondary | ICD-10-CM | POA: Diagnosis not present

## 2022-02-07 DIAGNOSIS — D631 Anemia in chronic kidney disease: Secondary | ICD-10-CM | POA: Diagnosis not present

## 2022-02-07 DIAGNOSIS — Z992 Dependence on renal dialysis: Secondary | ICD-10-CM | POA: Diagnosis not present

## 2022-02-07 DIAGNOSIS — N2581 Secondary hyperparathyroidism of renal origin: Secondary | ICD-10-CM | POA: Diagnosis not present

## 2022-02-07 DIAGNOSIS — N186 End stage renal disease: Secondary | ICD-10-CM | POA: Diagnosis not present

## 2022-02-07 DIAGNOSIS — N250 Renal osteodystrophy: Secondary | ICD-10-CM | POA: Diagnosis not present

## 2022-02-07 DIAGNOSIS — D509 Iron deficiency anemia, unspecified: Secondary | ICD-10-CM | POA: Diagnosis not present

## 2022-02-08 ENCOUNTER — Other Ambulatory Visit (HOSPITAL_COMMUNITY): Payer: Self-pay | Admitting: Student

## 2022-02-08 DIAGNOSIS — C179 Malignant neoplasm of small intestine, unspecified: Secondary | ICD-10-CM

## 2022-02-09 ENCOUNTER — Other Ambulatory Visit: Payer: Self-pay | Admitting: Student

## 2022-02-09 ENCOUNTER — Other Ambulatory Visit: Payer: Self-pay | Admitting: Internal Medicine

## 2022-02-09 DIAGNOSIS — Z992 Dependence on renal dialysis: Secondary | ICD-10-CM | POA: Diagnosis not present

## 2022-02-09 DIAGNOSIS — D509 Iron deficiency anemia, unspecified: Secondary | ICD-10-CM | POA: Diagnosis not present

## 2022-02-09 DIAGNOSIS — D631 Anemia in chronic kidney disease: Secondary | ICD-10-CM | POA: Diagnosis not present

## 2022-02-09 DIAGNOSIS — N2581 Secondary hyperparathyroidism of renal origin: Secondary | ICD-10-CM | POA: Diagnosis not present

## 2022-02-09 DIAGNOSIS — N250 Renal osteodystrophy: Secondary | ICD-10-CM | POA: Diagnosis not present

## 2022-02-09 DIAGNOSIS — N186 End stage renal disease: Secondary | ICD-10-CM | POA: Diagnosis not present

## 2022-02-10 ENCOUNTER — Ambulatory Visit (HOSPITAL_COMMUNITY)
Admission: RE | Admit: 2022-02-10 | Discharge: 2022-02-10 | Disposition: A | Discharge status: Home / Self Care | Payer: Medicare Other | Source: Ambulatory Visit | Attending: Hematology | Admitting: Hematology

## 2022-02-10 ENCOUNTER — Other Ambulatory Visit: Payer: Self-pay

## 2022-02-10 ENCOUNTER — Other Ambulatory Visit (HOSPITAL_COMMUNITY): Payer: Self-pay | Admitting: Hematology

## 2022-02-10 DIAGNOSIS — I509 Heart failure, unspecified: Secondary | ICD-10-CM | POA: Diagnosis not present

## 2022-02-10 DIAGNOSIS — C7A00 Malignant carcinoid tumor of unspecified site: Secondary | ICD-10-CM

## 2022-02-10 DIAGNOSIS — C7B8 Other secondary neuroendocrine tumors: Secondary | ICD-10-CM | POA: Diagnosis not present

## 2022-02-10 DIAGNOSIS — E785 Hyperlipidemia, unspecified: Secondary | ICD-10-CM | POA: Diagnosis not present

## 2022-02-10 DIAGNOSIS — C179 Malignant neoplasm of small intestine, unspecified: Secondary | ICD-10-CM

## 2022-02-10 DIAGNOSIS — Z01812 Encounter for preprocedural laboratory examination: Secondary | ICD-10-CM | POA: Insufficient documentation

## 2022-02-10 DIAGNOSIS — I130 Hypertensive heart and chronic kidney disease with heart failure and stage 1 through stage 4 chronic kidney disease, or unspecified chronic kidney disease: Secondary | ICD-10-CM | POA: Insufficient documentation

## 2022-02-10 DIAGNOSIS — N189 Chronic kidney disease, unspecified: Secondary | ICD-10-CM | POA: Insufficient documentation

## 2022-02-10 DIAGNOSIS — C7A8 Other malignant neuroendocrine tumors: Secondary | ICD-10-CM | POA: Diagnosis not present

## 2022-02-10 DIAGNOSIS — R1905 Periumbilic swelling, mass or lump: Secondary | ICD-10-CM | POA: Diagnosis not present

## 2022-02-10 DIAGNOSIS — Z8589 Personal history of malignant neoplasm of other organs and systems: Secondary | ICD-10-CM | POA: Diagnosis not present

## 2022-02-10 LAB — CBC
HCT: 33.1 % — ABNORMAL LOW (ref 39.0–52.0)
Hemoglobin: 10.4 g/dL — ABNORMAL LOW (ref 13.0–17.0)
MCH: 27.2 pg (ref 26.0–34.0)
MCHC: 31.4 g/dL (ref 30.0–36.0)
MCV: 86.6 fL (ref 80.0–100.0)
Platelets: 135 10*3/uL — ABNORMAL LOW (ref 150–400)
RBC: 3.82 MIL/uL — ABNORMAL LOW (ref 4.22–5.81)
RDW: 14.7 % (ref 11.5–15.5)
WBC: 5 10*3/uL (ref 4.0–10.5)
nRBC: 0 % (ref 0.0–0.2)

## 2022-02-10 LAB — PROTIME-INR
INR: 1.1 (ref 0.8–1.2)
Prothrombin Time: 13.7 seconds (ref 11.4–15.2)

## 2022-02-10 MED ORDER — FENTANYL CITRATE (PF) 100 MCG/2ML IJ SOLN
INTRAMUSCULAR | Status: AC | PRN
  Administered 2022-02-10 (×2): 25 ug via INTRAVENOUS

## 2022-02-10 MED ORDER — LIDOCAINE-EPINEPHRINE 1 %-1:100000 IJ SOLN
INTRAMUSCULAR | Status: AC
  Filled 2022-02-10: qty 1

## 2022-02-10 MED ORDER — GELATIN ABSORBABLE 12-7 MM EX MISC
CUTANEOUS | Status: AC
  Filled 2022-02-10: qty 1

## 2022-02-10 MED ORDER — MIDAZOLAM HCL 2 MG/2ML IJ SOLN
INTRAMUSCULAR | Status: AC | PRN
  Administered 2022-02-10 (×2): .5 mg via INTRAVENOUS

## 2022-02-10 MED ORDER — FENTANYL CITRATE (PF) 100 MCG/2ML IJ SOLN
INTRAMUSCULAR | Status: AC
  Filled 2022-02-10: qty 4

## 2022-02-10 MED ORDER — SODIUM CHLORIDE 0.9 % IV SOLN: INTRAVENOUS | Status: DC

## 2022-02-10 MED ORDER — MIDAZOLAM HCL 2 MG/2ML IJ SOLN
INTRAMUSCULAR | Status: AC
  Filled 2022-02-10: qty 4

## 2022-02-10 NOTE — H&P (Signed)
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 PET scan performed on 7/14 revealed new hepatic lesion within the left hepatic lobe, as well as peritoneal and mesenteric metastasis. Referral was made to IR for biopsy.   Past Medical History:  Diagnosis Date   Anemia    Cancer ([place])    right renal . Prostate- Radiation treatment   CHF (congestive heart failure) (HCC)    Chronic kidney disease    Dialysis T/TH/Sa   Edema    Heart murmur    "nothing to worry about"   Hyperlipidemia    Hypertension    Malignant carcinoid tumor ([place]) 01/03/2016   Pneumonia    as a child    Past Surgical History:  Procedure Laterality Date   AV FISTULA PLACEMENT Left 10/20/2014   Procedure: Left Arm ARTERIOVENOUS (AV) FISTULA CREATION;  Surgeon: Elam Dutch, MD;  Location: [facility];  Service: Vascular;  Laterality: Left;   NEPHRECTOMY Right 2010   PERIPHERAL VASCULAR BALLOON ANGIOPLASTY  06/20/2019   Procedure: PERIPHERAL VASCULAR BALLOON ANGIOPLASTY;  Surgeon: Elam Dutch, MD;  Location: [facility];  Service: Cardiovascular;;   REVISON OF ARTERIOVENOUS FISTULA Left 08/04/2019   Procedure: REVISON OF ARTERIOVENOUS FISTULA WITH SIDE BRANCH LIGATION;  Surgeon: Elam Dutch, MD;  Location: [facility];  Service: Vascular;  Laterality: Left;    Allergies: Patient has no known allergies.  Medications: Prior to Admission medications   Medication Sig  Start Date End Date Taking? Authorizing Provider  atorvastatin (LIPITOR) 80 MG tablet Take 80 mg by mouth daily at 6 PM.  11/04/13  Yes [provider]  Calcium Acetate 667 MG TABS Take 667-2,001 mg by mouth See admin instructions. Take 2001 mg with each meal and 667 mg with each snack   Yes [provider]  Calcium Carb-Cholecalciferol (CALCIUM 500 + D PO) Take 1 tablet by mouth in the morning, at noon, and at bedtime.   Yes [provider]  carvedilol (COREG) 6.25 MG tablet Take 6.25 mg by mouth 2 (two) times daily. 06/01/21  Yes [provider]  furosemide (LASIX) 40 MG tablet Take 40 mg by mouth every morning. 06/25/19  Yes [provider]  Omega-3 Fatty Acids (FISH OIL) 1000 MG CAPS Take 1,000 mg by mouth daily.    Yes [provider]  Riboflavin (B2) 100 MG TABS Take 100 mg by mouth daily.   Yes [provider]  sodium bicarbonate 650 MG tablet Take 650 mg by mouth 3 (three) times daily.   Yes [provider]  everolimus (AFINITOR) 5 MG tablet Take 1 tablet (5 mg total) by mouth daily. Patient not taking: Reported on 02/07/2022 06/16/21   Derek Jack, MD     Family History  Problem Relation Age of Onset   Cancer Mother    Hypertension Father    Cancer Sister     Social History   Socioeconomic History   Marital status: Married    Spouse name: Not on file   Number  of children: Not on file   Years of education: Not on file   Highest education level: Not on file  Occupational History   Not on file  Tobacco Use   Smoking status: Never   Smokeless tobacco: Never  Vaping Use   Vaping Use: Never used  Substance and Sexual Activity   Alcohol use: No    Alcohol/week: 0.0 standard drinks of alcohol   Drug use: No   Sexual activity: Not on file  Other Topics Concern   Not on file  Social History Narrative   Not on file   Social Determinants of Health   Financial Resource Strain: Low Risk  (05/24/2020)    Overall Financial Resource Strain (CARDIA)    Difficulty of Paying Living Expenses: Not hard at all  Food Insecurity: No Food Insecurity (05/24/2020)   Hunger Vital Sign    Worried About Running Out of Food in the Last Year: Never true    Ran Out of Food in the Last Year: Never true  Transportation Needs: No Transportation Needs (05/24/2020)   PRAPARE - Hydrologist (Medical): No    Lack of Transportation (Non-Medical): No  Physical Activity: Insufficiently Active (05/24/2020)   Exercise Vital Sign    Days of Exercise per Week: 7 days    Minutes of Exercise per Session: 10 min  Stress: No Stress Concern Present (05/24/2020)   [place]    Feeling of Stress : Not at all  Social Connections: Moderately Integrated (05/24/2020)   Social Connection and Isolation Panel [NHANES]    Frequency of Communication with Friends and Family: More than three times a week    Frequency of Social Gatherings with Friends and Family: Once a week    Attends Religious Services: More than 4 times per year    Active Member of [employer] or Organizations: No    Attends Archivist Meetings: Never    Marital Status: Married      Review of Systems: A 12 point ROS discussed and pertinent positives are indicated in the HPI above.  All other systems are negative.  Review of Systems  Constitutional:  Negative for chills and fatigue.  Respiratory:  Negative for chest tightness and shortness of breath.   Cardiovascular:  Positive for leg swelling. Negative for chest pain.  Gastrointestinal:  Negative for diarrhea, nausea and vomiting.  Neurological:  Negative for dizziness and headaches.  Psychiatric/Behavioral:  Negative for confusion.     Vital Signs: BP 127/60   Pulse 68   Temp 97.7 F (36.5 C) (Temporal)   Resp 18   Ht '6\' 5"'$  (1.956 m)   Wt 185 lb (83.9 kg)   SpO2 99%   BMI 21.94 kg/m     Physical  Exam Vitals reviewed.  Constitutional:      General: He is not in acute distress.    Appearance: He is normal weight.  HENT:     Head: Normocephalic.     Mouth/Throat:     Mouth: Mucous membranes are moist.  Cardiovascular:     Rate and Rhythm: Normal rate and regular rhythm.     Pulses: Normal pulses.     Heart sounds: Normal heart sounds.  Pulmonary:     Effort: Pulmonary effort is normal.     Breath sounds: Normal breath sounds.  Abdominal:     General: Bowel sounds are normal.     Palpations: Abdomen is soft.  Musculoskeletal:  Right lower leg: Edema present.     Left lower leg: Edema present.     Comments: 1+ pitting edema of RLE, 2+ pitting edema of LLE  Neurological:     Mental Status: He is alert and oriented to person, place, and time.  Psychiatric:        Mood and Affect: Mood normal.        Behavior: Behavior normal.     Imaging: NM PET (CU-64 DETECTNET)SKULL TO MID THIGH  Result Date: 01/27/2022 CLINICAL DATA:  Metastatic neuroendocrine tumor. Liver and peritoneal metastasis. EXAM: NUCLEAR MEDICINE PET SKULL BASE TO THIGH TECHNIQUE: 3.9 mCi copper 21 DOTATATE was injected intravenously. Full-ring PET imaging was performed from the skull base to thigh after the radiotracer. CT data was obtained and used for attenuation correction and anatomic localization. COMPARISON:  None Available. FINDINGS: NECK No radiotracer activity in neck lymph nodes. Incidental CT findings: None CHEST No radiotracer accumulation within mediastinal or hilar lymph nodes. No suspicious pulmonary nodules on the CT scan. Incidental CT finding:None ABDOMEN/PELVIS Multifocal intensely radiotracer avid metastatic lesions in the liver. Lesion of concern in central LEFT hepatic lobe on most recent comparison DOTATATE scan is clearly increased in the interval. Lesion measures 31 mm (image 101/4) compared to 15 mm with SUV max equal 37 compared SUV max equal 26. (Fused image 100). Cluster of lesions in  the dome liver/RIGHT hepatic capsule with SUV max equal 23.8 compared SUV max equal 20.7 (image 90). Lesion the posterior RIGHT hepatic lobe SUV max equal 18 (image 108) increased from SUV max equal 9. Lesion appears slightly increased in size although difficult to measure on noncontrast CT measuring 11 mm compared 8 mm. Again demonstrated radiotracer avid mesenteric mass in the RIGHT upper quadrant measuring 26 mm with SUV max equal 27 compared to 25 mm with SUV max equal 16. Intense metabolic activity associated peritoneal metastasis the level the umbilicus measures 31 mm with SUV max equal 27 compared to 25 mm with SUV max equal 17. peritoneal implant along the RIGHT common iliac vessel with SUV max equal 25 compared SUV max equal 19 measuring 17 mm on image 161) Large peritoneal implant posterior cul-de-sac measuring 35 mm compared to 31 mm with SUV max equal 42 radius max equal 46 Physiologic activity noted in the liver, spleen, adrenal glands and kidneys. Incidental CT findings:None SKELETON No focal activity to suggest skeletal metastasis. Incidental CT findings:None IMPRESSION: 1. Clear progression of hepatic metastatic well differentiated tumor within a large central LEFT hepatic lobe lesion. Multiple additional smaller metastatic lesions are increased in activity. 2. Relatively stable multifocal peritoneal and mesenteric metastasis with some increase in radiotracer activity. 3. No new metastatic disease. Electronically Signed   By: Suzy Bouchard M.D.   On: 01/27/2022 14:45    Labs:  CBC: Recent Labs    07/20/21 1024 09/12/21 1012 02/01/22 0809 02/10/22 0803  WBC 3.5* 3.6* 3.0* 5.0  HGB 10.7* 10.9* 9.9* 10.4*  HCT 32.6* 34.1* 30.9* 33.1*  PLT 112* 103* 90* 135*    COAGS: No results for input(s): "INR", "APTT" in the last 8760 hours.  BMP: Recent Labs    06/22/21 1103 07/20/21 1024 09/12/21 1012 02/01/22 0809  NA 137 136 133* 137  K 3.7 3.5 3.9 3.3*  CL 98 99 98 99  CO2 '29 28  29 29  '$ GLUCOSE 74 146* 86 166*  BUN 44* 42* 54* 33*  CALCIUM 8.7* 8.5* 8.4* 8.2*  CREATININE 7.21* 6.65* 8.11* 6.02*  GFRNONAA 7*  8* 6* 9*    LIVER FUNCTION TESTS: Recent Labs    06/22/21 1103 07/20/21 1024 09/12/21 1012 02/01/22 0809  BILITOT 0.5 0.4 0.6 0.9  AST 32 '26 29 29  '$ ALT '21 16 17 16  '$ ALKPHOS 75 73 80 60  PROT 6.2* 6.1* 6.0* 5.7*  ALBUMIN 3.5 3.3* 3.4* 3.2*    TUMOR MARKERS: No results for input(s): "AFPTM", "CEA", "CA199", "CHROMGRNA" in the last 8760 hours.  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 Case reviewed by Dr Pascal Lux and approved for ultrasound-guided biopsy of liver lesion or periumbilical lesion on 6/83/72.  Risks and benefits of ultrasound guided liver biopsy/ultrasound guided periumbilical lesion biopsy was discussed with the patient  including, but not limited to bleeding, infection, damage to adjacent structures or low yield requiring additional tests.  All of the questions were answered and there is agreement to proceed.  Consent signed and in chart.   Thank you for this interesting consult.  I greatly enjoyed meeting Allen Davenport and look forward to participating in their care.  A copy of this report was sent to the requesting provider on this date.  Electronically Signed: Lura Em, PA-C 02/10/2022, 8:38 AM   I spent a total of  15 Minutes   in face to face in clinical consultation, greater than 50% of which was counseling/coordinating care for new liver lesion and periumbilical lesion in the setting of existing malignant carcinoid tumor of the small intestine

## 2022-02-10 NOTE — Procedures (Signed)
Pre Procedure Dx: History of Neuroendocrine tumor, now with hypermetabolic periumbilical nodule Post Procedural Dx: Same  Technically successful US guided biopsy of periumbilical nodule.   EBL: None  No immediate complications.   Ronny Bacon, MD Pager #: [phone]

## 2022-02-11 DIAGNOSIS — N2581 Secondary hyperparathyroidism of renal origin: Secondary | ICD-10-CM | POA: Diagnosis not present

## 2022-02-11 DIAGNOSIS — Z992 Dependence on renal dialysis: Secondary | ICD-10-CM | POA: Diagnosis not present

## 2022-02-11 DIAGNOSIS — D509 Iron deficiency anemia, unspecified: Secondary | ICD-10-CM | POA: Diagnosis not present

## 2022-02-11 DIAGNOSIS — N250 Renal osteodystrophy: Secondary | ICD-10-CM | POA: Diagnosis not present

## 2022-02-11 DIAGNOSIS — D631 Anemia in chronic kidney disease: Secondary | ICD-10-CM | POA: Diagnosis not present

## 2022-02-11 DIAGNOSIS — N186 End stage renal disease: Secondary | ICD-10-CM | POA: Diagnosis not present

## 2022-02-13 DIAGNOSIS — N186 End stage renal disease: Secondary | ICD-10-CM | POA: Diagnosis not present

## 2022-02-13 DIAGNOSIS — Z992 Dependence on renal dialysis: Secondary | ICD-10-CM | POA: Diagnosis not present

## 2022-02-13 LAB — SURGICAL PATHOLOGY

## 2022-02-14 DIAGNOSIS — Z992 Dependence on renal dialysis: Secondary | ICD-10-CM | POA: Diagnosis not present

## 2022-02-14 DIAGNOSIS — E559 Vitamin D deficiency, unspecified: Secondary | ICD-10-CM | POA: Diagnosis not present

## 2022-02-14 DIAGNOSIS — N2581 Secondary hyperparathyroidism of renal origin: Secondary | ICD-10-CM | POA: Diagnosis not present

## 2022-02-14 DIAGNOSIS — N186 End stage renal disease: Secondary | ICD-10-CM | POA: Diagnosis not present

## 2022-02-14 DIAGNOSIS — D631 Anemia in chronic kidney disease: Secondary | ICD-10-CM | POA: Diagnosis not present

## 2022-02-14 DIAGNOSIS — D509 Iron deficiency anemia, unspecified: Secondary | ICD-10-CM | POA: Diagnosis not present

## 2022-02-14 DIAGNOSIS — N250 Renal osteodystrophy: Secondary | ICD-10-CM | POA: Diagnosis not present

## 2022-02-16 DIAGNOSIS — D631 Anemia in chronic kidney disease: Secondary | ICD-10-CM | POA: Diagnosis not present

## 2022-02-16 DIAGNOSIS — N250 Renal osteodystrophy: Secondary | ICD-10-CM | POA: Diagnosis not present

## 2022-02-16 DIAGNOSIS — Z992 Dependence on renal dialysis: Secondary | ICD-10-CM | POA: Diagnosis not present

## 2022-02-16 DIAGNOSIS — N2581 Secondary hyperparathyroidism of renal origin: Secondary | ICD-10-CM | POA: Diagnosis not present

## 2022-02-16 DIAGNOSIS — D509 Iron deficiency anemia, unspecified: Secondary | ICD-10-CM | POA: Diagnosis not present

## 2022-02-16 DIAGNOSIS — N186 End stage renal disease: Secondary | ICD-10-CM | POA: Diagnosis not present

## 2022-02-18 DIAGNOSIS — N2581 Secondary hyperparathyroidism of renal origin: Secondary | ICD-10-CM | POA: Diagnosis not present

## 2022-02-18 DIAGNOSIS — D509 Iron deficiency anemia, unspecified: Secondary | ICD-10-CM | POA: Diagnosis not present

## 2022-02-18 DIAGNOSIS — Z992 Dependence on renal dialysis: Secondary | ICD-10-CM | POA: Diagnosis not present

## 2022-02-18 DIAGNOSIS — N186 End stage renal disease: Secondary | ICD-10-CM | POA: Diagnosis not present

## 2022-02-18 DIAGNOSIS — D631 Anemia in chronic kidney disease: Secondary | ICD-10-CM | POA: Diagnosis not present

## 2022-02-18 DIAGNOSIS — N250 Renal osteodystrophy: Secondary | ICD-10-CM | POA: Diagnosis not present

## 2022-02-21 DIAGNOSIS — N2581 Secondary hyperparathyroidism of renal origin: Secondary | ICD-10-CM | POA: Diagnosis not present

## 2022-02-21 DIAGNOSIS — D631 Anemia in chronic kidney disease: Secondary | ICD-10-CM | POA: Diagnosis not present

## 2022-02-21 DIAGNOSIS — D509 Iron deficiency anemia, unspecified: Secondary | ICD-10-CM | POA: Diagnosis not present

## 2022-02-21 DIAGNOSIS — N250 Renal osteodystrophy: Secondary | ICD-10-CM | POA: Diagnosis not present

## 2022-02-21 DIAGNOSIS — Z992 Dependence on renal dialysis: Secondary | ICD-10-CM | POA: Diagnosis not present

## 2022-02-21 DIAGNOSIS — N186 End stage renal disease: Secondary | ICD-10-CM | POA: Diagnosis not present

## 2022-02-23 DIAGNOSIS — Z992 Dependence on renal dialysis: Secondary | ICD-10-CM | POA: Diagnosis not present

## 2022-02-23 DIAGNOSIS — D509 Iron deficiency anemia, unspecified: Secondary | ICD-10-CM | POA: Diagnosis not present

## 2022-02-23 DIAGNOSIS — N250 Renal osteodystrophy: Secondary | ICD-10-CM | POA: Diagnosis not present

## 2022-02-23 DIAGNOSIS — D631 Anemia in chronic kidney disease: Secondary | ICD-10-CM | POA: Diagnosis not present

## 2022-02-23 DIAGNOSIS — N186 End stage renal disease: Secondary | ICD-10-CM | POA: Diagnosis not present

## 2022-02-23 DIAGNOSIS — N2581 Secondary hyperparathyroidism of renal origin: Secondary | ICD-10-CM | POA: Diagnosis not present

## 2022-02-25 DIAGNOSIS — D509 Iron deficiency anemia, unspecified: Secondary | ICD-10-CM | POA: Diagnosis not present

## 2022-02-25 DIAGNOSIS — N250 Renal osteodystrophy: Secondary | ICD-10-CM | POA: Diagnosis not present

## 2022-02-25 DIAGNOSIS — N2581 Secondary hyperparathyroidism of renal origin: Secondary | ICD-10-CM | POA: Diagnosis not present

## 2022-02-25 DIAGNOSIS — Z992 Dependence on renal dialysis: Secondary | ICD-10-CM | POA: Diagnosis not present

## 2022-02-25 DIAGNOSIS — N186 End stage renal disease: Secondary | ICD-10-CM | POA: Diagnosis not present

## 2022-02-25 DIAGNOSIS — D631 Anemia in chronic kidney disease: Secondary | ICD-10-CM | POA: Diagnosis not present

## 2022-02-28 DIAGNOSIS — N186 End stage renal disease: Secondary | ICD-10-CM | POA: Diagnosis not present

## 2022-02-28 DIAGNOSIS — N2581 Secondary hyperparathyroidism of renal origin: Secondary | ICD-10-CM | POA: Diagnosis not present

## 2022-02-28 DIAGNOSIS — D631 Anemia in chronic kidney disease: Secondary | ICD-10-CM | POA: Diagnosis not present

## 2022-02-28 DIAGNOSIS — D509 Iron deficiency anemia, unspecified: Secondary | ICD-10-CM | POA: Diagnosis not present

## 2022-02-28 DIAGNOSIS — N250 Renal osteodystrophy: Secondary | ICD-10-CM | POA: Diagnosis not present

## 2022-02-28 DIAGNOSIS — Z992 Dependence on renal dialysis: Secondary | ICD-10-CM | POA: Diagnosis not present

## 2022-03-01 ENCOUNTER — Ambulatory Visit (HOSPITAL_COMMUNITY): Payer: Medicare Other | Admitting: Hematology

## 2022-03-02 DIAGNOSIS — M818 Other osteoporosis without current pathological fracture: Secondary | ICD-10-CM | POA: Diagnosis not present

## 2022-03-02 DIAGNOSIS — D631 Anemia in chronic kidney disease: Secondary | ICD-10-CM | POA: Diagnosis not present

## 2022-03-02 DIAGNOSIS — Z992 Dependence on renal dialysis: Secondary | ICD-10-CM | POA: Diagnosis not present

## 2022-03-02 DIAGNOSIS — N2581 Secondary hyperparathyroidism of renal origin: Secondary | ICD-10-CM | POA: Diagnosis not present

## 2022-03-02 DIAGNOSIS — I10 Essential (primary) hypertension: Secondary | ICD-10-CM | POA: Diagnosis not present

## 2022-03-02 DIAGNOSIS — D509 Iron deficiency anemia, unspecified: Secondary | ICD-10-CM | POA: Diagnosis not present

## 2022-03-02 DIAGNOSIS — N250 Renal osteodystrophy: Secondary | ICD-10-CM | POA: Diagnosis not present

## 2022-03-02 DIAGNOSIS — N185 Chronic kidney disease, stage 5: Secondary | ICD-10-CM | POA: Diagnosis not present

## 2022-03-02 DIAGNOSIS — N186 End stage renal disease: Secondary | ICD-10-CM | POA: Diagnosis not present

## 2022-03-02 DIAGNOSIS — J0191 Acute recurrent sinusitis, unspecified: Secondary | ICD-10-CM | POA: Diagnosis not present

## 2022-03-02 DIAGNOSIS — E7849 Other hyperlipidemia: Secondary | ICD-10-CM | POA: Diagnosis not present

## 2022-03-06 ENCOUNTER — Encounter (HOSPITAL_COMMUNITY): Payer: Self-pay

## 2022-03-06 DIAGNOSIS — C7B8 Other secondary neuroendocrine tumors: Secondary | ICD-10-CM | POA: Diagnosis not present

## 2022-03-06 DIAGNOSIS — C7A8 Other malignant neuroendocrine tumors: Secondary | ICD-10-CM | POA: Diagnosis not present

## 2022-03-07 DIAGNOSIS — D509 Iron deficiency anemia, unspecified: Secondary | ICD-10-CM | POA: Diagnosis not present

## 2022-03-07 DIAGNOSIS — Z992 Dependence on renal dialysis: Secondary | ICD-10-CM | POA: Diagnosis not present

## 2022-03-07 DIAGNOSIS — N2581 Secondary hyperparathyroidism of renal origin: Secondary | ICD-10-CM | POA: Diagnosis not present

## 2022-03-07 DIAGNOSIS — D631 Anemia in chronic kidney disease: Secondary | ICD-10-CM | POA: Diagnosis not present

## 2022-03-07 DIAGNOSIS — N186 End stage renal disease: Secondary | ICD-10-CM | POA: Diagnosis not present

## 2022-03-07 DIAGNOSIS — N250 Renal osteodystrophy: Secondary | ICD-10-CM | POA: Diagnosis not present

## 2022-03-09 ENCOUNTER — Ambulatory Visit (HOSPITAL_COMMUNITY): Payer: Medicare Other | Admitting: Hematology

## 2022-03-09 DIAGNOSIS — N186 End stage renal disease: Secondary | ICD-10-CM | POA: Diagnosis not present

## 2022-03-09 DIAGNOSIS — D509 Iron deficiency anemia, unspecified: Secondary | ICD-10-CM | POA: Diagnosis not present

## 2022-03-09 DIAGNOSIS — Z992 Dependence on renal dialysis: Secondary | ICD-10-CM | POA: Diagnosis not present

## 2022-03-09 DIAGNOSIS — N250 Renal osteodystrophy: Secondary | ICD-10-CM | POA: Diagnosis not present

## 2022-03-09 DIAGNOSIS — D631 Anemia in chronic kidney disease: Secondary | ICD-10-CM | POA: Diagnosis not present

## 2022-03-09 DIAGNOSIS — N2581 Secondary hyperparathyroidism of renal origin: Secondary | ICD-10-CM | POA: Diagnosis not present

## 2022-03-11 DIAGNOSIS — N250 Renal osteodystrophy: Secondary | ICD-10-CM | POA: Diagnosis not present

## 2022-03-11 DIAGNOSIS — Z992 Dependence on renal dialysis: Secondary | ICD-10-CM | POA: Diagnosis not present

## 2022-03-11 DIAGNOSIS — D509 Iron deficiency anemia, unspecified: Secondary | ICD-10-CM | POA: Diagnosis not present

## 2022-03-11 DIAGNOSIS — N2581 Secondary hyperparathyroidism of renal origin: Secondary | ICD-10-CM | POA: Diagnosis not present

## 2022-03-11 DIAGNOSIS — N186 End stage renal disease: Secondary | ICD-10-CM | POA: Diagnosis not present

## 2022-03-11 DIAGNOSIS — D631 Anemia in chronic kidney disease: Secondary | ICD-10-CM | POA: Diagnosis not present

## 2022-03-13 ENCOUNTER — Inpatient Hospital Stay: Payer: Medicare Other

## 2022-03-13 ENCOUNTER — Inpatient Hospital Stay: Payer: Medicare Other | Attending: Hematology | Admitting: Hematology

## 2022-03-13 VITALS — BP 98/77 | HR 88 | Temp 98.2°F | Resp 16 | Ht 77.0 in | Wt 168.3 lb

## 2022-03-13 DIAGNOSIS — C7B02 Secondary carcinoid tumors of liver: Secondary | ICD-10-CM | POA: Insufficient documentation

## 2022-03-13 DIAGNOSIS — C7A019 Malignant carcinoid tumor of the small intestine, unspecified portion: Secondary | ICD-10-CM | POA: Diagnosis not present

## 2022-03-13 DIAGNOSIS — C7A00 Malignant carcinoid tumor of unspecified site: Secondary | ICD-10-CM | POA: Diagnosis not present

## 2022-03-13 MED ORDER — OCTREOTIDE ACETATE 30 MG IM KIT
30.0000 mg | PACK | Freq: Once | INTRAMUSCULAR | Status: AC
  Administered 2022-03-13: 30 mg via INTRAMUSCULAR

## 2022-03-13 NOTE — Progress Notes (Signed)
Allen Davenport presents today for injection per the provider's orders.  Sandostatin '30mg'$  administration without incident; injection site WNL; see MAR for injection details.  Patient tolerated procedure well and without incident. Vital signs stable. No complaints at this time. Discharged from clinic by wheel chair in stable condition. Alert and oriented x 3. F/U with [facility] as scheduled.

## 2022-03-13 NOTE — Patient Instructions (Signed)
Allen Davenport  Discharge Instructions: Thank you for choosing [facility] to provide your oncology and hematology care.  If you have a lab appointment with the [facility], please come in thru the Main Entrance and check in at the main information desk.  Wear comfortable clothing and clothing appropriate for easy access to any Portacath or PICC line.   We strive to give you quality time with your provider. You may need to reschedule your appointment if you arrive late (15 or more minutes).  Arriving late affects you and other patients whose appointments are after yours.  Also, if you miss three or more appointments without notifying the office, you may be dismissed from the clinic at the provider's discretion.      For prescription refill requests, have your pharmacy contact our office and allow 72 hours for refills to be completed.    Today you received the following chemotherapy and/or immunotherapy agents Sandostatin.  Octreotide Injection Solution What is this medication? OCTREOTIDE (ok TREE oh tide) treats high levels of growth hormone (acromegaly). It works by reducing the amount of growth hormone your body makes. This reduces symptoms and the risk of health problems caused by too much growth hormone, such as diabetes and heart disease. It may also be used to treat diarrhea caused by neuroendocrine tumors. It works by slowing down the release of serotonin from the tumor cells. This reduces the number of bowel movements you have. This medicine may be used for other purposes; ask your health care provider or pharmacist if you have questions. COMMON BRAND NAME(S): Leatha Gilding, Sandostatin What should I tell my care team before I take this medication? They need to know if you have any of these conditions: Diabetes Gallbladder disease Kidney disease Liver disease Thyroid disease An unusual or allergic reaction to octreotide, other medications, foods, dyes, or  preservatives Pregnant or trying to get pregnant Breast-feeding How should I use this medication? This medication is injected under the skin or into a vein. It is usually given by your care team in a hospital or clinic setting. If you get this medication at home, you will be taught how to prepare and give it. Use exactly as directed. Take it as directed on the prescription label at the same time every day. Keep taking it unless your care team tells you to stop. Allow the injection solution to come to room temperature before use. Do not warm it artificially. It is important that you put your used needles and syringes in a special sharps container. Do not put them in a trash can. If you do not have a sharps container, call your pharmacist or care team to get one. Talk to your care team about the use of this medication in children. Special care may be needed. Overdosage: If you think you have taken too much of this medicine contact a poison control center or emergency room at once. NOTE: This medicine is only for you. Do not share this medicine with others. What if I miss a dose? If you miss a dose, take it as soon as you can. If it is almost time for your next dose, take only that dose. Do not take double or extra doses. What may interact with this medication? Bromocriptine Certain medications for blood pressure, heart disease, irregular heartbeat Cyclosporine Diuretics Medications for diabetes, including insulin Quinidine This list may not describe all possible interactions. Give your health care provider a list of all the medicines, herbs,  non-prescription drugs, or dietary supplements you use. Also tell them if you smoke, drink alcohol, or use illegal drugs. Some items may interact with your medicine. What should I watch for while using this medication? Visit your care team for regular checks on your progress. Tell your care team if your symptoms do not start to get better or if they get  worse. To help reduce irritation at the injection site, use a different site for each injection and make sure the solution is at room temperature before use. This medication may cause decreases in blood sugar. Signs of low blood sugar include chills, cool, pale skin or cold sweats, drowsiness, extreme hunger, fast heartbeat, headache, nausea, nervousness or anxiety, shakiness, trembling, unsteadiness, tiredness, or weakness. Contact your care team right away if you experience any of these symptoms. This medication may increase blood sugar. The risk may be higher in patients who already have diabetes. Ask your care team what you can do to lower your risk of diabetes while taking this medication. You should make sure you get enough vitamin B12 while you are taking this medication. Discuss the foods you eat and the vitamins you take with your care team. What side effects may I notice from receiving this medication? Side effects that you should report to your care team as soon as possible: Allergic reactions--skin rash, itching, hives, swelling of the face, lips, tongue, or throat Gallbladder problems--severe stomach pain, nausea, vomiting, fever Heart rhythm changes--fast or irregular heartbeat, dizziness, feeling faint or lightheaded, chest pain, trouble breathing High blood sugar (hyperglycemia)--increased thirst or amount of urine, unusual weakness or fatigue, blurry vision Low blood sugar (hypoglycemia)--tremors or shaking, anxiety, sweating, cold or clammy skin, confusion, dizziness, rapid heartbeat Low thyroid levels (hypothyroidism)--unusual weakness or fatigue, increased sensitivity to cold, constipation, hair loss, dry skin, weight gain, feelings of depression Low vitamin B12 level--pain, tingling, or numbness in the hands or feet, muscle weakness, dizziness, confusion, trouble concentrating Pancreatitis--severe stomach pain that spreads to your back or gets worse after eating or when touched,  fever, nausea, vomiting Side effects that usually do not require medical attention (report to your care team if they continue or are bothersome): Diarrhea Dizziness Gas Headache Pain, redness, or irritation at injection site Stomach pain This list may not describe all possible side effects. Call your doctor for medical advice about side effects. You may report side effects to FDA at 1-800-FDA-1088. Where should I keep my medication? Keep out of the reach of children and pets. Store in the refrigerator. Protect from light. Allow to come to room temperature naturally. Do not use artificial heat. If protected from light, the injection may be stored between 20 and 30 degrees C (70 and 86 degrees F) for 14 days. After the initial use, throw away any unused portion of a multiple dose vial after 14 days. Get rid of any unused portions of the ampules after use. To get rid of medications that are no longer needed or have expired: Take the medication to a medication take-back program. Ask your pharmacy or law enforcement to find a location. If you cannot return the medication, ask your pharmacist or care team how to get rid of the medication safely. NOTE: This sheet is a summary. It may not cover all possible information. If you have questions about this medicine, talk to your doctor, pharmacist, or health care provider.  2023 Elsevier/Gold Standard (2007-08-24 00:00:00)       To help prevent nausea and vomiting after your treatment, we  encourage you to take your nausea medication as directed.  BELOW ARE SYMPTOMS THAT SHOULD BE REPORTED IMMEDIATELY: *FEVER GREATER THAN 100.4 F (38 C) OR HIGHER *CHILLS OR SWEATING *NAUSEA AND VOMITING THAT IS NOT CONTROLLED WITH YOUR NAUSEA MEDICATION *UNUSUAL SHORTNESS OF BREATH *UNUSUAL BRUISING OR BLEEDING *URINARY PROBLEMS (pain or burning when urinating, or frequent urination) *BOWEL PROBLEMS (unusual diarrhea, constipation, pain near the anus) TENDERNESS IN  MOUTH AND THROAT WITH OR WITHOUT PRESENCE OF ULCERS (sore throat, sores in mouth, or a toothache) UNUSUAL RASH, SWELLING OR PAIN  UNUSUAL VAGINAL DISCHARGE OR ITCHING   Items with * indicate a potential emergency and should be followed up as soon as possible or go to the Emergency Department if any problems should occur.  Please show the CHEMOTHERAPY ALERT CARD or IMMUNOTHERAPY ALERT CARD at check-in to the Emergency Department and triage nurse.  Should you have questions after your visit or need to cancel or reschedule your appointment, please contact [facility] [phone]  and follow the prompts.  Office hours are 8:00 a.m. to 4:30 p.m. Monday - Friday. Please note that voicemails left after 4:00 p.m. may not be returned until the following business day.  We are closed weekends and major holidays. You have access to a nurse at all times for urgent questions. Please call the main number to the clinic [phone] and follow the prompts.  For any non-urgent questions, you may also contact your provider using MyChart. We now offer e-Visits for anyone 42 and older to request care online for non-urgent symptoms. For details visit [email or web address].   Also download the MyChart app! Go to the app store, search "MyChart", open the app, select [facility], and log in with your MyChart username and password.  Masks are optional in the cancer centers. If you would like for your care team to wear a mask while they are taking care of you, please let them know. You may have one support person who is at least 81 years old accompany you for your appointments.

## 2022-03-13 NOTE — Progress Notes (Signed)
[place], [place] [address]   CLINIC:  Medical Oncology/Hematology  PCP:  [name], MD [place] / [place] [address] [phone]   REASON FOR VISIT:  Follow-up for metastatic carcinoid tumor of small intestine  PRIOR THERAPY: Sandostatin monthly and everolimus discontinued on 02/01/2022  NGS Results: TMB-low, MSI-stable, no other targetable mutations  CURRENT THERAPY: Under work-up  BRIEF ONCOLOGIC HISTORY:  Oncology History   No history exists.    CANCER STAGING:  Cancer Staging  No matching staging information was found for the patient.  INTERVAL HISTORY:  [name], a 81 y.o. male, follow-up for metastatic carcinoid tumor of the small bowel.  He had some soreness at the biopsy site at the umbilical nodule which resolved.  He still has some tenderness.  He is continuing Marinol for appetite stimulation.  He is continuing dialysis 3 times weekly without any major issues.  REVIEW OF SYSTEMS:  Review of Systems  Constitutional:  Negative for appetite change and fatigue.  Respiratory:  Negative for wheezing.   Gastrointestinal:  Negative for abdominal pain and diarrhea.  All other systems reviewed and are negative.   PAST MEDICAL/SURGICAL HISTORY:  Past Medical History:  Diagnosis Date   Anemia    Cancer ([place])    right renal . Prostate- Radiation treatment   CHF (congestive heart failure) (HCC)    Chronic kidney disease    Dialysis T/TH/Sa   Edema    Heart murmur    "nothing to worry about"   Hyperlipidemia    Hypertension    Malignant carcinoid tumor ([place]) 01/03/2016   Pneumonia    as a child   Past Surgical History:  Procedure Laterality Date   AV FISTULA PLACEMENT Left 10/20/2014   Procedure: Left Arm ARTERIOVENOUS (AV) FISTULA CREATION;  Surgeon: Elam Dutch, MD;  Location: [facility];  Service: Vascular;  Laterality: Left;   NEPHRECTOMY Right 2010   PERIPHERAL VASCULAR BALLOON  ANGIOPLASTY  06/20/2019   Procedure: PERIPHERAL VASCULAR BALLOON ANGIOPLASTY;  Surgeon: Elam Dutch, MD;  Location: [facility];  Service: Cardiovascular;;   REVISON OF ARTERIOVENOUS FISTULA Left 08/04/2019   Procedure: REVISON OF ARTERIOVENOUS FISTULA WITH SIDE BRANCH LIGATION;  Surgeon: Elam Dutch, MD;  Location: [facility] OR;  Service: Vascular;  Laterality: Left;    SOCIAL HISTORY:  Social History   Socioeconomic History   Marital status: Married    Spouse name: Not on file   Number of children: Not on file   Years of education: Not on file   Highest education level: Not on file  Occupational History   Not on file  Tobacco Use   Smoking status: Never   Smokeless tobacco: Never  Vaping Use   Vaping Use: Never used  Substance and Sexual Activity   Alcohol use: No    Alcohol/week: 0.0 standard drinks of alcohol   Drug use: No   Sexual activity: Not on file  Other Topics Concern   Not on file  Social History Narrative   Not on file   Social Determinants of Health   Financial Resource Strain: Low Risk  (05/24/2020)   Overall Financial Resource Strain (CARDIA)    Difficulty of Paying Living Expenses: Not hard at all  Food Insecurity: No Food Insecurity (05/24/2020)   Hunger Vital Sign    Worried About Running Out of Food in the Last Year: Never true    Ran Out of Food  in the Last Year: Never true  Transportation Needs: No Transportation Needs (05/24/2020)   PRAPARE - Hydrologist (Medical): No    Lack of Transportation (Non-Medical): No  Physical Activity: Insufficiently Active (05/24/2020)   Exercise Vital Sign    Days of Exercise per Week: 7 days    Minutes of Exercise per Session: 10 min  Stress: No Stress Concern Present (05/24/2020)   [place]    Feeling of Stress : Not at all  Social Connections: Moderately Integrated (05/24/2020)   Social Connection and  Isolation Panel [NHANES]    Frequency of Communication with Friends and Family: More than three times a week    Frequency of Social Gatherings with Friends and Family: Once a week    Attends Religious Services: More than 4 times per year    Active Member of [employer] or Organizations: No    Attends Archivist Meetings: Never    Marital Status: Married  Human resources officer Violence: Not At Risk (05/24/2020)   Humiliation, Afraid, Rape, and Kick questionnaire    Fear of Current or Ex-Partner: No    Emotionally Abused: No    Physically Abused: No    Sexually Abused: No    FAMILY HISTORY:  Family History  Problem Relation Age of Onset   Cancer Mother    Hypertension Father    Cancer Sister     CURRENT MEDICATIONS:  Current Outpatient Medications  Medication Sig Dispense Refill   atorvastatin (LIPITOR) 80 MG tablet Take 80 mg by mouth daily at 6 PM.      Calcium Acetate 667 MG TABS Take 667-2,001 mg by mouth See admin instructions. Take 2001 mg with each meal and 667 mg with each snack     Calcium Carb-Cholecalciferol (CALCIUM 500 + D PO) Take 1 tablet by mouth in the morning, at noon, and at bedtime.     carvedilol (COREG) 6.25 MG tablet Take 6.25 mg by mouth 2 (two) times daily.     fexofenadine (ALLEGRA) 180 MG tablet Take 180 mg by mouth daily.     furosemide (LASIX) 40 MG tablet Take 40 mg by mouth every morning.     mirtazapine (REMERON) 7.5 MG tablet Take 7.5 mg by mouth at bedtime.     Omega-3 Fatty Acids (FISH OIL) 1000 MG CAPS Take 1,000 mg by mouth daily.      Riboflavin (B2) 100 MG TABS Take 100 mg by mouth daily.     sodium bicarbonate 650 MG tablet Take 650 mg by mouth 3 (three) times daily.     No current facility-administered medications for this visit.    ALLERGIES:  No Known Allergies  PHYSICAL EXAM:  Performance status (ECOG): 1 - Symptomatic but completely ambulatory  Vitals:   03/13/22 0815  BP: 98/77  Pulse: 88  Resp: 16  Temp: 98.2 F (36.8 C)   SpO2: 99%   Wt Readings from Last 3 Encounters:  03/13/22 168 lb 4.8 oz (76.3 kg)  02/10/22 185 lb (83.9 kg)  02/01/22 184 lb (83.5 kg)   Physical Exam Vitals reviewed.  Constitutional:      Appearance: Normal appearance.  Cardiovascular:     Rate and Rhythm: Normal rate and regular rhythm.     Pulses: Normal pulses.     Heart sounds: Normal heart sounds.  Pulmonary:     Effort: Pulmonary effort is normal.     Breath sounds: Normal breath sounds.  Abdominal:     Palpations: Abdomen is soft. There is no hepatomegaly, splenomegaly or mass.     Tenderness: There is no abdominal tenderness.  Neurological:     General: No focal deficit present.     Mental Status: He is alert and oriented to person, place, and time.  Psychiatric:        Mood and Affect: Mood normal.        Behavior: Behavior normal.      LABORATORY DATA:  I have reviewed the labs as listed.     Latest Ref Rng & Units 02/10/2022    8:03 AM 02/01/2022    8:09 AM 09/12/2021   10:12 AM  CBC  WBC 4.0 - 10.5 K/uL 5.0  3.0  3.6   Hemoglobin 13.0 - 17.0 g/dL 10.4  9.9  10.9   Hematocrit 39.0 - 52.0 % 33.1  30.9  34.1   Platelets 150 - 400 K/uL 135  90  103       Latest Ref Rng & Units 02/01/2022    8:09 AM 09/12/2021   10:12 AM 07/20/2021   10:24 AM  CMP  Glucose 70 - 99 mg/dL 166  86  146   BUN 8 - 23 mg/dL 33  54  42   Creatinine 0.61 - 1.24 mg/dL 6.02  8.11  6.65   Sodium 135 - 145 mmol/L 137  133  136   Potassium 3.5 - 5.1 mmol/L 3.3  3.9  3.5   Chloride 98 - 111 mmol/L 99  98  99   CO2 22 - 32 mmol/L _0 Calcium 8.9 - 10.3 mg/dL 8.2  8.4  8.5   Total Protein 6.5 - 8.1 g/dL 5.7  6.0  6.1   Total Bilirubin 0.3 - 1.2 mg/dL 0.9  0.6  0.4   Alkaline Phos 38 - 126 U/L 60  80  73   AST 15 - 41 U/L _1 ALT 0 - 44 U/L _2 DIAGNOSTIC IMAGING:  I have independently reviewed the scans and discussed with the patient. No results found.   ASSESSMENT:  1.  Malignant carcinoid tumor  to the liver and peritoneum: -Sandostatin started back about 5 years ago.  Everolimus 5 mg daily started on 05/17/2018.  Everolimus discontinued on 02/01/2022. - PET scan (01/27/2022): Clear progression of hepatic metastatic tumor with a large central left hepatic lesion.  Multiple additional smaller metastatic lesions are increased in activity.  Stable multifocal peritoneal and mesenteric metastasis with some increase in radiotracer activity.  No new metastatic disease. - Umbilical nodule biopsy (02/10/2022): Metastatic neuroendocrine tumor - NGS test: TMB-low, MSI-stable, no other targetable mutations.   2.  Normocytic anemia: -This is from combination of end-stage renal disease and relative iron deficiency and myelosuppression from everolimus.   3.  ESRD on HD: -Started on HD on 05/20/2019   PLAN:  1.  Malignant carcinoid tumor to the liver and peritoneum: - We have discussed the biopsy results which showed neuroendocrine carcinoma of the periumbilical nodule. - Discussed NGS test results which did not show any targetable mutations. - We talked about CAPTEM regimen which has not been tested widely in dialysis patients.  Capecitabine can be given with dose reduction.  I will look into temozolomide whether it can be given safely or not. - Other option is PRRT therapy which has also not been validated in dialysis patients.  I will reach out to experts in the field.   2.  Normocytic anemia: - Continue Retacrit with hemodialysis.  Latest hemoglobin is 10.4.   3.  ESRD on HD: - Continue HD on Tuesday, Thursday and Saturday.     Orders placed this encounter:  No orders of the defined types were placed in this encounter.    Derek Jack, MD [place] [phone]

## 2022-03-13 NOTE — Patient Instructions (Addendum)
Allen Davenport at [facility] Discharge Instructions   You were seen and examined today by Dr. Delton Coombes.  He reviewed the results of the special testing we sent on your biopsy that did not show any mutations to target.   There are two different treatment options for you since this cancer has gotten bigger. One is a combination of two chemo pills. Another is nuclear medicine (radiation therapy) that you would get in [place] every 8 weeks. Dr. Raliegh Ip will need to reach out to some experts as neither of these treatments have been studied in dialysis patients.   Return as scheduled.    Thank you for choosing [place] at [facility] to provide your oncology and hematology care.  To afford each patient quality time with our provider, please arrive at least 15 minutes before your scheduled appointment time.   If you have a lab appointment with the [facility] please come in thru the Main Entrance and check in at the main information desk.  You need to re-schedule your appointment should you arrive 10 or more minutes late.  We strive to give you quality time with our providers, and arriving late affects you and other patients whose appointments are after yours.  Also, if you no show three or more times for appointments you may be dismissed from the clinic at the providers discretion.     Again, thank you for choosing [facility].  Our hope is that these requests will decrease the amount of time that you wait before being seen by our physicians.       _____________________________________________________________  Should you have questions after your visit to [facility], please contact our office at [phone] and follow the prompts.  Our office hours are 8:00 a.m. and 4:30 p.m. Monday - Friday.  Please note that voicemails left after 4:00 p.m. may not be returned until the following business day.  We are closed weekends and  major holidays.  You do have access to a nurse 24-7, just call the main number to the clinic [phone] and do not press any options, hold on the line and a nurse will answer the phone.    For prescription refill requests, have your pharmacy contact our office and allow 72 hours.    Due to Covid, you will need to wear a mask upon entering the hospital. If you do not have a mask, a mask will be given to you at the Main Entrance upon arrival. For doctor visits, patients may have 1 support person age 25 or older with them. For treatment visits, patients can not have anyone with them due to social distancing guidelines and our immunocompromised population.

## 2022-03-14 DIAGNOSIS — N2581 Secondary hyperparathyroidism of renal origin: Secondary | ICD-10-CM | POA: Diagnosis not present

## 2022-03-14 DIAGNOSIS — D631 Anemia in chronic kidney disease: Secondary | ICD-10-CM | POA: Diagnosis not present

## 2022-03-14 DIAGNOSIS — N186 End stage renal disease: Secondary | ICD-10-CM | POA: Diagnosis not present

## 2022-03-14 DIAGNOSIS — N250 Renal osteodystrophy: Secondary | ICD-10-CM | POA: Diagnosis not present

## 2022-03-14 DIAGNOSIS — Z992 Dependence on renal dialysis: Secondary | ICD-10-CM | POA: Diagnosis not present

## 2022-03-14 DIAGNOSIS — D509 Iron deficiency anemia, unspecified: Secondary | ICD-10-CM | POA: Diagnosis not present

## 2022-03-16 ENCOUNTER — Other Ambulatory Visit (HOSPITAL_COMMUNITY): Payer: Self-pay

## 2022-03-16 DIAGNOSIS — D631 Anemia in chronic kidney disease: Secondary | ICD-10-CM | POA: Diagnosis not present

## 2022-03-16 DIAGNOSIS — N186 End stage renal disease: Secondary | ICD-10-CM | POA: Diagnosis not present

## 2022-03-16 DIAGNOSIS — N250 Renal osteodystrophy: Secondary | ICD-10-CM | POA: Diagnosis not present

## 2022-03-16 DIAGNOSIS — N2581 Secondary hyperparathyroidism of renal origin: Secondary | ICD-10-CM | POA: Diagnosis not present

## 2022-03-16 DIAGNOSIS — Z992 Dependence on renal dialysis: Secondary | ICD-10-CM | POA: Diagnosis not present

## 2022-03-16 DIAGNOSIS — D509 Iron deficiency anemia, unspecified: Secondary | ICD-10-CM | POA: Diagnosis not present

## 2022-03-18 DIAGNOSIS — D509 Iron deficiency anemia, unspecified: Secondary | ICD-10-CM | POA: Diagnosis not present

## 2022-03-18 DIAGNOSIS — N186 End stage renal disease: Secondary | ICD-10-CM | POA: Diagnosis not present

## 2022-03-18 DIAGNOSIS — D631 Anemia in chronic kidney disease: Secondary | ICD-10-CM | POA: Diagnosis not present

## 2022-03-18 DIAGNOSIS — Z992 Dependence on renal dialysis: Secondary | ICD-10-CM | POA: Diagnosis not present

## 2022-03-18 DIAGNOSIS — N250 Renal osteodystrophy: Secondary | ICD-10-CM | POA: Diagnosis not present

## 2022-03-18 DIAGNOSIS — N2581 Secondary hyperparathyroidism of renal origin: Secondary | ICD-10-CM | POA: Diagnosis not present

## 2022-03-21 DIAGNOSIS — N2581 Secondary hyperparathyroidism of renal origin: Secondary | ICD-10-CM | POA: Diagnosis not present

## 2022-03-21 DIAGNOSIS — N186 End stage renal disease: Secondary | ICD-10-CM | POA: Diagnosis not present

## 2022-03-21 DIAGNOSIS — D509 Iron deficiency anemia, unspecified: Secondary | ICD-10-CM | POA: Diagnosis not present

## 2022-03-21 DIAGNOSIS — D631 Anemia in chronic kidney disease: Secondary | ICD-10-CM | POA: Diagnosis not present

## 2022-03-21 DIAGNOSIS — Z992 Dependence on renal dialysis: Secondary | ICD-10-CM | POA: Diagnosis not present

## 2022-03-23 DIAGNOSIS — D509 Iron deficiency anemia, unspecified: Secondary | ICD-10-CM | POA: Diagnosis not present

## 2022-03-23 DIAGNOSIS — N2581 Secondary hyperparathyroidism of renal origin: Secondary | ICD-10-CM | POA: Diagnosis not present

## 2022-03-23 DIAGNOSIS — D631 Anemia in chronic kidney disease: Secondary | ICD-10-CM | POA: Diagnosis not present

## 2022-03-23 DIAGNOSIS — N186 End stage renal disease: Secondary | ICD-10-CM | POA: Diagnosis not present

## 2022-03-23 DIAGNOSIS — Z992 Dependence on renal dialysis: Secondary | ICD-10-CM | POA: Diagnosis not present

## 2022-03-25 DIAGNOSIS — D509 Iron deficiency anemia, unspecified: Secondary | ICD-10-CM | POA: Diagnosis not present

## 2022-03-25 DIAGNOSIS — D631 Anemia in chronic kidney disease: Secondary | ICD-10-CM | POA: Diagnosis not present

## 2022-03-25 DIAGNOSIS — Z992 Dependence on renal dialysis: Secondary | ICD-10-CM | POA: Diagnosis not present

## 2022-03-25 DIAGNOSIS — N186 End stage renal disease: Secondary | ICD-10-CM | POA: Diagnosis not present

## 2022-03-25 DIAGNOSIS — N2581 Secondary hyperparathyroidism of renal origin: Secondary | ICD-10-CM | POA: Diagnosis not present

## 2022-03-27 DIAGNOSIS — E1151 Type 2 diabetes mellitus with diabetic peripheral angiopathy without gangrene: Secondary | ICD-10-CM | POA: Diagnosis not present

## 2022-03-27 DIAGNOSIS — E1140 Type 2 diabetes mellitus with diabetic neuropathy, unspecified: Secondary | ICD-10-CM | POA: Diagnosis not present

## 2022-03-28 DIAGNOSIS — N2581 Secondary hyperparathyroidism of renal origin: Secondary | ICD-10-CM | POA: Diagnosis not present

## 2022-03-28 DIAGNOSIS — D509 Iron deficiency anemia, unspecified: Secondary | ICD-10-CM | POA: Diagnosis not present

## 2022-03-28 DIAGNOSIS — D631 Anemia in chronic kidney disease: Secondary | ICD-10-CM | POA: Diagnosis not present

## 2022-03-28 DIAGNOSIS — Z992 Dependence on renal dialysis: Secondary | ICD-10-CM | POA: Diagnosis not present

## 2022-03-28 DIAGNOSIS — N186 End stage renal disease: Secondary | ICD-10-CM | POA: Diagnosis not present

## 2022-03-30 DIAGNOSIS — D509 Iron deficiency anemia, unspecified: Secondary | ICD-10-CM | POA: Diagnosis not present

## 2022-03-30 DIAGNOSIS — Z992 Dependence on renal dialysis: Secondary | ICD-10-CM | POA: Diagnosis not present

## 2022-03-30 DIAGNOSIS — D631 Anemia in chronic kidney disease: Secondary | ICD-10-CM | POA: Diagnosis not present

## 2022-03-30 DIAGNOSIS — N2581 Secondary hyperparathyroidism of renal origin: Secondary | ICD-10-CM | POA: Diagnosis not present

## 2022-03-30 DIAGNOSIS — N186 End stage renal disease: Secondary | ICD-10-CM | POA: Diagnosis not present

## 2022-04-01 DIAGNOSIS — D631 Anemia in chronic kidney disease: Secondary | ICD-10-CM | POA: Diagnosis not present

## 2022-04-01 DIAGNOSIS — N186 End stage renal disease: Secondary | ICD-10-CM | POA: Diagnosis not present

## 2022-04-01 DIAGNOSIS — D509 Iron deficiency anemia, unspecified: Secondary | ICD-10-CM | POA: Diagnosis not present

## 2022-04-01 DIAGNOSIS — Z992 Dependence on renal dialysis: Secondary | ICD-10-CM | POA: Diagnosis not present

## 2022-04-01 DIAGNOSIS — N2581 Secondary hyperparathyroidism of renal origin: Secondary | ICD-10-CM | POA: Diagnosis not present

## 2022-04-04 DIAGNOSIS — N186 End stage renal disease: Secondary | ICD-10-CM | POA: Diagnosis not present

## 2022-04-04 DIAGNOSIS — D509 Iron deficiency anemia, unspecified: Secondary | ICD-10-CM | POA: Diagnosis not present

## 2022-04-04 DIAGNOSIS — Z992 Dependence on renal dialysis: Secondary | ICD-10-CM | POA: Diagnosis not present

## 2022-04-04 DIAGNOSIS — D631 Anemia in chronic kidney disease: Secondary | ICD-10-CM | POA: Diagnosis not present

## 2022-04-04 DIAGNOSIS — N2581 Secondary hyperparathyroidism of renal origin: Secondary | ICD-10-CM | POA: Diagnosis not present

## 2022-04-06 DIAGNOSIS — N186 End stage renal disease: Secondary | ICD-10-CM | POA: Diagnosis not present

## 2022-04-06 DIAGNOSIS — N2581 Secondary hyperparathyroidism of renal origin: Secondary | ICD-10-CM | POA: Diagnosis not present

## 2022-04-06 DIAGNOSIS — D631 Anemia in chronic kidney disease: Secondary | ICD-10-CM | POA: Diagnosis not present

## 2022-04-06 DIAGNOSIS — Z992 Dependence on renal dialysis: Secondary | ICD-10-CM | POA: Diagnosis not present

## 2022-04-06 DIAGNOSIS — D509 Iron deficiency anemia, unspecified: Secondary | ICD-10-CM | POA: Diagnosis not present

## 2022-04-08 DIAGNOSIS — N186 End stage renal disease: Secondary | ICD-10-CM | POA: Diagnosis not present

## 2022-04-08 DIAGNOSIS — Z992 Dependence on renal dialysis: Secondary | ICD-10-CM | POA: Diagnosis not present

## 2022-04-08 DIAGNOSIS — N2581 Secondary hyperparathyroidism of renal origin: Secondary | ICD-10-CM | POA: Diagnosis not present

## 2022-04-08 DIAGNOSIS — D509 Iron deficiency anemia, unspecified: Secondary | ICD-10-CM | POA: Diagnosis not present

## 2022-04-08 DIAGNOSIS — D631 Anemia in chronic kidney disease: Secondary | ICD-10-CM | POA: Diagnosis not present

## 2022-04-11 DIAGNOSIS — N186 End stage renal disease: Secondary | ICD-10-CM | POA: Diagnosis not present

## 2022-04-11 DIAGNOSIS — D631 Anemia in chronic kidney disease: Secondary | ICD-10-CM | POA: Diagnosis not present

## 2022-04-11 DIAGNOSIS — N2581 Secondary hyperparathyroidism of renal origin: Secondary | ICD-10-CM | POA: Diagnosis not present

## 2022-04-11 DIAGNOSIS — Z992 Dependence on renal dialysis: Secondary | ICD-10-CM | POA: Diagnosis not present

## 2022-04-11 DIAGNOSIS — D509 Iron deficiency anemia, unspecified: Secondary | ICD-10-CM | POA: Diagnosis not present

## 2022-04-13 DIAGNOSIS — D509 Iron deficiency anemia, unspecified: Secondary | ICD-10-CM | POA: Diagnosis not present

## 2022-04-13 DIAGNOSIS — Z992 Dependence on renal dialysis: Secondary | ICD-10-CM | POA: Diagnosis not present

## 2022-04-13 DIAGNOSIS — D631 Anemia in chronic kidney disease: Secondary | ICD-10-CM | POA: Diagnosis not present

## 2022-04-13 DIAGNOSIS — N2581 Secondary hyperparathyroidism of renal origin: Secondary | ICD-10-CM | POA: Diagnosis not present

## 2022-04-13 DIAGNOSIS — N186 End stage renal disease: Secondary | ICD-10-CM | POA: Diagnosis not present

## 2022-04-15 DIAGNOSIS — D509 Iron deficiency anemia, unspecified: Secondary | ICD-10-CM | POA: Diagnosis not present

## 2022-04-15 DIAGNOSIS — N186 End stage renal disease: Secondary | ICD-10-CM | POA: Diagnosis not present

## 2022-04-15 DIAGNOSIS — D631 Anemia in chronic kidney disease: Secondary | ICD-10-CM | POA: Diagnosis not present

## 2022-04-15 DIAGNOSIS — N2581 Secondary hyperparathyroidism of renal origin: Secondary | ICD-10-CM | POA: Diagnosis not present

## 2022-04-15 DIAGNOSIS — Z992 Dependence on renal dialysis: Secondary | ICD-10-CM | POA: Diagnosis not present

## 2022-04-18 DIAGNOSIS — Z23 Encounter for immunization: Secondary | ICD-10-CM | POA: Diagnosis not present

## 2022-04-18 DIAGNOSIS — N250 Renal osteodystrophy: Secondary | ICD-10-CM | POA: Diagnosis not present

## 2022-04-18 DIAGNOSIS — N2581 Secondary hyperparathyroidism of renal origin: Secondary | ICD-10-CM | POA: Diagnosis not present

## 2022-04-18 DIAGNOSIS — N186 End stage renal disease: Secondary | ICD-10-CM | POA: Diagnosis not present

## 2022-04-18 DIAGNOSIS — D509 Iron deficiency anemia, unspecified: Secondary | ICD-10-CM | POA: Diagnosis not present

## 2022-04-18 DIAGNOSIS — D631 Anemia in chronic kidney disease: Secondary | ICD-10-CM | POA: Diagnosis not present

## 2022-04-18 DIAGNOSIS — Z992 Dependence on renal dialysis: Secondary | ICD-10-CM | POA: Diagnosis not present

## 2022-04-19 DIAGNOSIS — N186 End stage renal disease: Secondary | ICD-10-CM | POA: Diagnosis not present

## 2022-04-19 DIAGNOSIS — E8770 Fluid overload, unspecified: Secondary | ICD-10-CM | POA: Diagnosis not present

## 2022-04-19 DIAGNOSIS — Z992 Dependence on renal dialysis: Secondary | ICD-10-CM | POA: Diagnosis not present

## 2022-04-20 DIAGNOSIS — Z23 Encounter for immunization: Secondary | ICD-10-CM | POA: Diagnosis not present

## 2022-04-20 DIAGNOSIS — D631 Anemia in chronic kidney disease: Secondary | ICD-10-CM | POA: Diagnosis not present

## 2022-04-20 DIAGNOSIS — N2581 Secondary hyperparathyroidism of renal origin: Secondary | ICD-10-CM | POA: Diagnosis not present

## 2022-04-20 DIAGNOSIS — Z992 Dependence on renal dialysis: Secondary | ICD-10-CM | POA: Diagnosis not present

## 2022-04-20 DIAGNOSIS — N186 End stage renal disease: Secondary | ICD-10-CM | POA: Diagnosis not present

## 2022-04-20 DIAGNOSIS — D509 Iron deficiency anemia, unspecified: Secondary | ICD-10-CM | POA: Diagnosis not present

## 2022-04-22 DIAGNOSIS — D509 Iron deficiency anemia, unspecified: Secondary | ICD-10-CM | POA: Diagnosis not present

## 2022-04-22 DIAGNOSIS — N186 End stage renal disease: Secondary | ICD-10-CM | POA: Diagnosis not present

## 2022-04-22 DIAGNOSIS — Z23 Encounter for immunization: Secondary | ICD-10-CM | POA: Diagnosis not present

## 2022-04-22 DIAGNOSIS — Z992 Dependence on renal dialysis: Secondary | ICD-10-CM | POA: Diagnosis not present

## 2022-04-22 DIAGNOSIS — N2581 Secondary hyperparathyroidism of renal origin: Secondary | ICD-10-CM | POA: Diagnosis not present

## 2022-04-22 DIAGNOSIS — D631 Anemia in chronic kidney disease: Secondary | ICD-10-CM | POA: Diagnosis not present

## 2022-04-25 DIAGNOSIS — N186 End stage renal disease: Secondary | ICD-10-CM | POA: Diagnosis not present

## 2022-04-25 DIAGNOSIS — N2581 Secondary hyperparathyroidism of renal origin: Secondary | ICD-10-CM | POA: Diagnosis not present

## 2022-04-25 DIAGNOSIS — Z992 Dependence on renal dialysis: Secondary | ICD-10-CM | POA: Diagnosis not present

## 2022-04-25 DIAGNOSIS — D631 Anemia in chronic kidney disease: Secondary | ICD-10-CM | POA: Diagnosis not present

## 2022-04-25 DIAGNOSIS — D509 Iron deficiency anemia, unspecified: Secondary | ICD-10-CM | POA: Diagnosis not present

## 2022-04-25 DIAGNOSIS — Z23 Encounter for immunization: Secondary | ICD-10-CM | POA: Diagnosis not present

## 2022-04-27 DIAGNOSIS — D631 Anemia in chronic kidney disease: Secondary | ICD-10-CM | POA: Diagnosis not present

## 2022-04-27 DIAGNOSIS — D509 Iron deficiency anemia, unspecified: Secondary | ICD-10-CM | POA: Diagnosis not present

## 2022-04-27 DIAGNOSIS — N2581 Secondary hyperparathyroidism of renal origin: Secondary | ICD-10-CM | POA: Diagnosis not present

## 2022-04-27 DIAGNOSIS — N186 End stage renal disease: Secondary | ICD-10-CM | POA: Diagnosis not present

## 2022-04-27 DIAGNOSIS — Z992 Dependence on renal dialysis: Secondary | ICD-10-CM | POA: Diagnosis not present

## 2022-04-27 DIAGNOSIS — Z23 Encounter for immunization: Secondary | ICD-10-CM | POA: Diagnosis not present

## 2022-04-29 DIAGNOSIS — D509 Iron deficiency anemia, unspecified: Secondary | ICD-10-CM | POA: Diagnosis not present

## 2022-04-29 DIAGNOSIS — N2581 Secondary hyperparathyroidism of renal origin: Secondary | ICD-10-CM | POA: Diagnosis not present

## 2022-04-29 DIAGNOSIS — Z23 Encounter for immunization: Secondary | ICD-10-CM | POA: Diagnosis not present

## 2022-04-29 DIAGNOSIS — N186 End stage renal disease: Secondary | ICD-10-CM | POA: Diagnosis not present

## 2022-04-29 DIAGNOSIS — D631 Anemia in chronic kidney disease: Secondary | ICD-10-CM | POA: Diagnosis not present

## 2022-04-29 DIAGNOSIS — Z992 Dependence on renal dialysis: Secondary | ICD-10-CM | POA: Diagnosis not present

## 2022-05-02 DIAGNOSIS — Z992 Dependence on renal dialysis: Secondary | ICD-10-CM | POA: Diagnosis not present

## 2022-05-02 DIAGNOSIS — Z23 Encounter for immunization: Secondary | ICD-10-CM | POA: Diagnosis not present

## 2022-05-02 DIAGNOSIS — D509 Iron deficiency anemia, unspecified: Secondary | ICD-10-CM | POA: Diagnosis not present

## 2022-05-02 DIAGNOSIS — N186 End stage renal disease: Secondary | ICD-10-CM | POA: Diagnosis not present

## 2022-05-02 DIAGNOSIS — D631 Anemia in chronic kidney disease: Secondary | ICD-10-CM | POA: Diagnosis not present

## 2022-05-02 DIAGNOSIS — N2581 Secondary hyperparathyroidism of renal origin: Secondary | ICD-10-CM | POA: Diagnosis not present

## 2022-05-04 DIAGNOSIS — N186 End stage renal disease: Secondary | ICD-10-CM | POA: Diagnosis not present

## 2022-05-04 DIAGNOSIS — D631 Anemia in chronic kidney disease: Secondary | ICD-10-CM | POA: Diagnosis not present

## 2022-05-04 DIAGNOSIS — D509 Iron deficiency anemia, unspecified: Secondary | ICD-10-CM | POA: Diagnosis not present

## 2022-05-04 DIAGNOSIS — N2581 Secondary hyperparathyroidism of renal origin: Secondary | ICD-10-CM | POA: Diagnosis not present

## 2022-05-04 DIAGNOSIS — Z23 Encounter for immunization: Secondary | ICD-10-CM | POA: Diagnosis not present

## 2022-05-04 DIAGNOSIS — Z992 Dependence on renal dialysis: Secondary | ICD-10-CM | POA: Diagnosis not present

## 2022-05-06 DIAGNOSIS — D509 Iron deficiency anemia, unspecified: Secondary | ICD-10-CM | POA: Diagnosis not present

## 2022-05-06 DIAGNOSIS — D631 Anemia in chronic kidney disease: Secondary | ICD-10-CM | POA: Diagnosis not present

## 2022-05-06 DIAGNOSIS — N2581 Secondary hyperparathyroidism of renal origin: Secondary | ICD-10-CM | POA: Diagnosis not present

## 2022-05-06 DIAGNOSIS — Z23 Encounter for immunization: Secondary | ICD-10-CM | POA: Diagnosis not present

## 2022-05-06 DIAGNOSIS — N186 End stage renal disease: Secondary | ICD-10-CM | POA: Diagnosis not present

## 2022-05-06 DIAGNOSIS — Z992 Dependence on renal dialysis: Secondary | ICD-10-CM | POA: Diagnosis not present

## 2022-05-09 DIAGNOSIS — D509 Iron deficiency anemia, unspecified: Secondary | ICD-10-CM | POA: Diagnosis not present

## 2022-05-09 DIAGNOSIS — Z23 Encounter for immunization: Secondary | ICD-10-CM | POA: Diagnosis not present

## 2022-05-09 DIAGNOSIS — N2581 Secondary hyperparathyroidism of renal origin: Secondary | ICD-10-CM | POA: Diagnosis not present

## 2022-05-09 DIAGNOSIS — N186 End stage renal disease: Secondary | ICD-10-CM | POA: Diagnosis not present

## 2022-05-09 DIAGNOSIS — D631 Anemia in chronic kidney disease: Secondary | ICD-10-CM | POA: Diagnosis not present

## 2022-05-09 DIAGNOSIS — Z992 Dependence on renal dialysis: Secondary | ICD-10-CM | POA: Diagnosis not present

## 2022-05-11 ENCOUNTER — Other Ambulatory Visit: Payer: Self-pay | Admitting: *Deleted

## 2022-05-11 DIAGNOSIS — Z992 Dependence on renal dialysis: Secondary | ICD-10-CM | POA: Diagnosis not present

## 2022-05-11 DIAGNOSIS — D631 Anemia in chronic kidney disease: Secondary | ICD-10-CM | POA: Diagnosis not present

## 2022-05-11 DIAGNOSIS — N2581 Secondary hyperparathyroidism of renal origin: Secondary | ICD-10-CM | POA: Diagnosis not present

## 2022-05-11 DIAGNOSIS — C7A00 Malignant carcinoid tumor of unspecified site: Secondary | ICD-10-CM

## 2022-05-11 DIAGNOSIS — N186 End stage renal disease: Secondary | ICD-10-CM | POA: Diagnosis not present

## 2022-05-11 DIAGNOSIS — D509 Iron deficiency anemia, unspecified: Secondary | ICD-10-CM | POA: Diagnosis not present

## 2022-05-11 DIAGNOSIS — Z23 Encounter for immunization: Secondary | ICD-10-CM | POA: Diagnosis not present

## 2022-05-13 DIAGNOSIS — D509 Iron deficiency anemia, unspecified: Secondary | ICD-10-CM | POA: Diagnosis not present

## 2022-05-13 DIAGNOSIS — N186 End stage renal disease: Secondary | ICD-10-CM | POA: Diagnosis not present

## 2022-05-13 DIAGNOSIS — D631 Anemia in chronic kidney disease: Secondary | ICD-10-CM | POA: Diagnosis not present

## 2022-05-13 DIAGNOSIS — Z992 Dependence on renal dialysis: Secondary | ICD-10-CM | POA: Diagnosis not present

## 2022-05-13 DIAGNOSIS — N2581 Secondary hyperparathyroidism of renal origin: Secondary | ICD-10-CM | POA: Diagnosis not present

## 2022-05-13 DIAGNOSIS — Z23 Encounter for immunization: Secondary | ICD-10-CM | POA: Diagnosis not present

## 2022-05-16 DIAGNOSIS — D509 Iron deficiency anemia, unspecified: Secondary | ICD-10-CM | POA: Diagnosis not present

## 2022-05-16 DIAGNOSIS — N186 End stage renal disease: Secondary | ICD-10-CM | POA: Diagnosis not present

## 2022-05-16 DIAGNOSIS — Z23 Encounter for immunization: Secondary | ICD-10-CM | POA: Diagnosis not present

## 2022-05-16 DIAGNOSIS — D631 Anemia in chronic kidney disease: Secondary | ICD-10-CM | POA: Diagnosis not present

## 2022-05-16 DIAGNOSIS — N2581 Secondary hyperparathyroidism of renal origin: Secondary | ICD-10-CM | POA: Diagnosis not present

## 2022-05-16 DIAGNOSIS — Z992 Dependence on renal dialysis: Secondary | ICD-10-CM | POA: Diagnosis not present

## 2022-05-18 DIAGNOSIS — D631 Anemia in chronic kidney disease: Secondary | ICD-10-CM | POA: Diagnosis not present

## 2022-05-18 DIAGNOSIS — N2581 Secondary hyperparathyroidism of renal origin: Secondary | ICD-10-CM | POA: Diagnosis not present

## 2022-05-18 DIAGNOSIS — D509 Iron deficiency anemia, unspecified: Secondary | ICD-10-CM | POA: Diagnosis not present

## 2022-05-18 DIAGNOSIS — N186 End stage renal disease: Secondary | ICD-10-CM | POA: Diagnosis not present

## 2022-05-18 DIAGNOSIS — Z992 Dependence on renal dialysis: Secondary | ICD-10-CM | POA: Diagnosis not present

## 2022-05-18 DIAGNOSIS — N250 Renal osteodystrophy: Secondary | ICD-10-CM | POA: Diagnosis not present

## 2022-05-20 DIAGNOSIS — D509 Iron deficiency anemia, unspecified: Secondary | ICD-10-CM | POA: Diagnosis not present

## 2022-05-20 DIAGNOSIS — N186 End stage renal disease: Secondary | ICD-10-CM | POA: Diagnosis not present

## 2022-05-20 DIAGNOSIS — D631 Anemia in chronic kidney disease: Secondary | ICD-10-CM | POA: Diagnosis not present

## 2022-05-20 DIAGNOSIS — Z992 Dependence on renal dialysis: Secondary | ICD-10-CM | POA: Diagnosis not present

## 2022-05-20 DIAGNOSIS — N2581 Secondary hyperparathyroidism of renal origin: Secondary | ICD-10-CM | POA: Diagnosis not present

## 2022-05-20 DIAGNOSIS — N250 Renal osteodystrophy: Secondary | ICD-10-CM | POA: Diagnosis not present

## 2022-05-23 DIAGNOSIS — N250 Renal osteodystrophy: Secondary | ICD-10-CM | POA: Diagnosis not present

## 2022-05-23 DIAGNOSIS — Z992 Dependence on renal dialysis: Secondary | ICD-10-CM | POA: Diagnosis not present

## 2022-05-23 DIAGNOSIS — D509 Iron deficiency anemia, unspecified: Secondary | ICD-10-CM | POA: Diagnosis not present

## 2022-05-23 DIAGNOSIS — D631 Anemia in chronic kidney disease: Secondary | ICD-10-CM | POA: Diagnosis not present

## 2022-05-23 DIAGNOSIS — N186 End stage renal disease: Secondary | ICD-10-CM | POA: Diagnosis not present

## 2022-05-23 DIAGNOSIS — N2581 Secondary hyperparathyroidism of renal origin: Secondary | ICD-10-CM | POA: Diagnosis not present

## 2022-05-25 DIAGNOSIS — Z992 Dependence on renal dialysis: Secondary | ICD-10-CM | POA: Diagnosis not present

## 2022-05-25 DIAGNOSIS — D631 Anemia in chronic kidney disease: Secondary | ICD-10-CM | POA: Diagnosis not present

## 2022-05-25 DIAGNOSIS — N250 Renal osteodystrophy: Secondary | ICD-10-CM | POA: Diagnosis not present

## 2022-05-25 DIAGNOSIS — N186 End stage renal disease: Secondary | ICD-10-CM | POA: Diagnosis not present

## 2022-05-25 DIAGNOSIS — D509 Iron deficiency anemia, unspecified: Secondary | ICD-10-CM | POA: Diagnosis not present

## 2022-05-25 DIAGNOSIS — N2581 Secondary hyperparathyroidism of renal origin: Secondary | ICD-10-CM | POA: Diagnosis not present

## 2022-05-27 DIAGNOSIS — Z992 Dependence on renal dialysis: Secondary | ICD-10-CM | POA: Diagnosis not present

## 2022-05-27 DIAGNOSIS — D631 Anemia in chronic kidney disease: Secondary | ICD-10-CM | POA: Diagnosis not present

## 2022-05-27 DIAGNOSIS — D509 Iron deficiency anemia, unspecified: Secondary | ICD-10-CM | POA: Diagnosis not present

## 2022-05-27 DIAGNOSIS — N186 End stage renal disease: Secondary | ICD-10-CM | POA: Diagnosis not present

## 2022-05-27 DIAGNOSIS — N2581 Secondary hyperparathyroidism of renal origin: Secondary | ICD-10-CM | POA: Diagnosis not present

## 2022-05-27 DIAGNOSIS — N250 Renal osteodystrophy: Secondary | ICD-10-CM | POA: Diagnosis not present

## 2022-05-30 DIAGNOSIS — J302 Other seasonal allergic rhinitis: Secondary | ICD-10-CM | POA: Diagnosis not present

## 2022-05-30 DIAGNOSIS — N186 End stage renal disease: Secondary | ICD-10-CM | POA: Diagnosis not present

## 2022-05-30 DIAGNOSIS — E7849 Other hyperlipidemia: Secondary | ICD-10-CM | POA: Diagnosis not present

## 2022-05-30 DIAGNOSIS — N185 Chronic kidney disease, stage 5: Secondary | ICD-10-CM | POA: Diagnosis not present

## 2022-05-30 DIAGNOSIS — M818 Other osteoporosis without current pathological fracture: Secondary | ICD-10-CM | POA: Diagnosis not present

## 2022-05-30 DIAGNOSIS — I10 Essential (primary) hypertension: Secondary | ICD-10-CM | POA: Diagnosis not present

## 2022-05-31 DIAGNOSIS — N186 End stage renal disease: Secondary | ICD-10-CM | POA: Diagnosis not present

## 2022-05-31 DIAGNOSIS — N2581 Secondary hyperparathyroidism of renal origin: Secondary | ICD-10-CM | POA: Diagnosis not present

## 2022-05-31 DIAGNOSIS — D631 Anemia in chronic kidney disease: Secondary | ICD-10-CM | POA: Diagnosis not present

## 2022-05-31 DIAGNOSIS — N250 Renal osteodystrophy: Secondary | ICD-10-CM | POA: Diagnosis not present

## 2022-05-31 DIAGNOSIS — Z992 Dependence on renal dialysis: Secondary | ICD-10-CM | POA: Diagnosis not present

## 2022-05-31 DIAGNOSIS — D509 Iron deficiency anemia, unspecified: Secondary | ICD-10-CM | POA: Diagnosis not present

## 2022-06-01 DIAGNOSIS — N2581 Secondary hyperparathyroidism of renal origin: Secondary | ICD-10-CM | POA: Diagnosis not present

## 2022-06-01 DIAGNOSIS — Z992 Dependence on renal dialysis: Secondary | ICD-10-CM | POA: Diagnosis not present

## 2022-06-01 DIAGNOSIS — N186 End stage renal disease: Secondary | ICD-10-CM | POA: Diagnosis not present

## 2022-06-01 DIAGNOSIS — D509 Iron deficiency anemia, unspecified: Secondary | ICD-10-CM | POA: Diagnosis not present

## 2022-06-01 DIAGNOSIS — D631 Anemia in chronic kidney disease: Secondary | ICD-10-CM | POA: Diagnosis not present

## 2022-06-01 DIAGNOSIS — N250 Renal osteodystrophy: Secondary | ICD-10-CM | POA: Diagnosis not present

## 2022-06-03 DIAGNOSIS — D509 Iron deficiency anemia, unspecified: Secondary | ICD-10-CM | POA: Diagnosis not present

## 2022-06-03 DIAGNOSIS — Z992 Dependence on renal dialysis: Secondary | ICD-10-CM | POA: Diagnosis not present

## 2022-06-03 DIAGNOSIS — N186 End stage renal disease: Secondary | ICD-10-CM | POA: Diagnosis not present

## 2022-06-03 DIAGNOSIS — N2581 Secondary hyperparathyroidism of renal origin: Secondary | ICD-10-CM | POA: Diagnosis not present

## 2022-06-03 DIAGNOSIS — D631 Anemia in chronic kidney disease: Secondary | ICD-10-CM | POA: Diagnosis not present

## 2022-06-03 DIAGNOSIS — N250 Renal osteodystrophy: Secondary | ICD-10-CM | POA: Diagnosis not present

## 2022-06-06 DIAGNOSIS — N250 Renal osteodystrophy: Secondary | ICD-10-CM | POA: Diagnosis not present

## 2022-06-06 DIAGNOSIS — Z992 Dependence on renal dialysis: Secondary | ICD-10-CM | POA: Diagnosis not present

## 2022-06-06 DIAGNOSIS — D509 Iron deficiency anemia, unspecified: Secondary | ICD-10-CM | POA: Diagnosis not present

## 2022-06-06 DIAGNOSIS — N186 End stage renal disease: Secondary | ICD-10-CM | POA: Diagnosis not present

## 2022-06-06 DIAGNOSIS — N2581 Secondary hyperparathyroidism of renal origin: Secondary | ICD-10-CM | POA: Diagnosis not present

## 2022-06-06 DIAGNOSIS — D631 Anemia in chronic kidney disease: Secondary | ICD-10-CM | POA: Diagnosis not present

## 2022-06-07 ENCOUNTER — Other Ambulatory Visit: Payer: Self-pay

## 2022-06-07 DIAGNOSIS — C7A00 Malignant carcinoid tumor of unspecified site: Secondary | ICD-10-CM

## 2022-06-09 DIAGNOSIS — N250 Renal osteodystrophy: Secondary | ICD-10-CM | POA: Diagnosis not present

## 2022-06-09 DIAGNOSIS — Z992 Dependence on renal dialysis: Secondary | ICD-10-CM | POA: Diagnosis not present

## 2022-06-09 DIAGNOSIS — N186 End stage renal disease: Secondary | ICD-10-CM | POA: Diagnosis not present

## 2022-06-09 DIAGNOSIS — D509 Iron deficiency anemia, unspecified: Secondary | ICD-10-CM | POA: Diagnosis not present

## 2022-06-09 DIAGNOSIS — D631 Anemia in chronic kidney disease: Secondary | ICD-10-CM | POA: Diagnosis not present

## 2022-06-09 DIAGNOSIS — N2581 Secondary hyperparathyroidism of renal origin: Secondary | ICD-10-CM | POA: Diagnosis not present

## 2022-06-10 DIAGNOSIS — D631 Anemia in chronic kidney disease: Secondary | ICD-10-CM | POA: Diagnosis not present

## 2022-06-10 DIAGNOSIS — D509 Iron deficiency anemia, unspecified: Secondary | ICD-10-CM | POA: Diagnosis not present

## 2022-06-10 DIAGNOSIS — Z992 Dependence on renal dialysis: Secondary | ICD-10-CM | POA: Diagnosis not present

## 2022-06-10 DIAGNOSIS — N2581 Secondary hyperparathyroidism of renal origin: Secondary | ICD-10-CM | POA: Diagnosis not present

## 2022-06-10 DIAGNOSIS — N250 Renal osteodystrophy: Secondary | ICD-10-CM | POA: Diagnosis not present

## 2022-06-10 DIAGNOSIS — N186 End stage renal disease: Secondary | ICD-10-CM | POA: Diagnosis not present

## 2022-06-12 ENCOUNTER — Inpatient Hospital Stay: Payer: Medicare Other | Attending: Hematology

## 2022-06-12 DIAGNOSIS — C7B02 Secondary carcinoid tumors of liver: Secondary | ICD-10-CM | POA: Diagnosis not present

## 2022-06-12 DIAGNOSIS — E1151 Type 2 diabetes mellitus with diabetic peripheral angiopathy without gangrene: Secondary | ICD-10-CM | POA: Diagnosis not present

## 2022-06-12 DIAGNOSIS — C7A00 Malignant carcinoid tumor of unspecified site: Secondary | ICD-10-CM | POA: Diagnosis not present

## 2022-06-12 DIAGNOSIS — E1140 Type 2 diabetes mellitus with diabetic neuropathy, unspecified: Secondary | ICD-10-CM | POA: Diagnosis not present

## 2022-06-12 LAB — CBC WITH DIFFERENTIAL/PLATELET
Abs Immature Granulocytes: 0.01 10*3/uL (ref 0.00–0.07)
Basophils Absolute: 0 10*3/uL (ref 0.0–0.1)
Basophils Relative: 1 %
Eosinophils Absolute: 0.4 10*3/uL (ref 0.0–0.5)
Eosinophils Relative: 7 %
HCT: 36.6 % — ABNORMAL LOW (ref 39.0–52.0)
Hemoglobin: 11.8 g/dL — ABNORMAL LOW (ref 13.0–17.0)
Immature Granulocytes: 0 %
Lymphocytes Relative: 9 %
Lymphs Abs: 0.6 10*3/uL — ABNORMAL LOW (ref 0.7–4.0)
MCH: 31.3 pg (ref 26.0–34.0)
MCHC: 32.2 g/dL (ref 30.0–36.0)
MCV: 97.1 fL (ref 80.0–100.0)
Monocytes Absolute: 0.7 10*3/uL (ref 0.1–1.0)
Monocytes Relative: 12 %
Neutro Abs: 4.6 10*3/uL (ref 1.7–7.7)
Neutrophils Relative %: 71 %
Platelets: 109 10*3/uL — ABNORMAL LOW (ref 150–400)
RBC: 3.77 MIL/uL — ABNORMAL LOW (ref 4.22–5.81)
RDW: 14.6 % (ref 11.5–15.5)
WBC: 6.4 10*3/uL (ref 4.0–10.5)
nRBC: 0 % (ref 0.0–0.2)

## 2022-06-12 LAB — COMPREHENSIVE METABOLIC PANEL
ALT: 18 U/L (ref 0–44)
AST: 29 U/L (ref 15–41)
Albumin: 3.3 g/dL — ABNORMAL LOW (ref 3.5–5.0)
Alkaline Phosphatase: 89 U/L (ref 38–126)
Anion gap: 8 (ref 5–15)
BUN: 36 mg/dL — ABNORMAL HIGH (ref 8–23)
CO2: 31 mmol/L (ref 22–32)
Calcium: 9.4 mg/dL (ref 8.9–10.3)
Chloride: 99 mmol/L (ref 98–111)
Creatinine, Ser: 6.07 mg/dL — ABNORMAL HIGH (ref 0.61–1.24)
GFR, Estimated: 9 mL/min — ABNORMAL LOW (ref >60)
Glucose, Bld: 85 mg/dL (ref 70–99)
Potassium: 3.5 mmol/L (ref 3.5–5.1)
Sodium: 138 mmol/L (ref 135–145)
Total Bilirubin: 0.9 mg/dL (ref 0.3–1.2)
Total Protein: 6 g/dL — ABNORMAL LOW (ref 6.5–8.1)

## 2022-06-12 LAB — LACTATE DEHYDROGENASE: LDH: 131 U/L (ref 98–192)

## 2022-06-13 DIAGNOSIS — N2581 Secondary hyperparathyroidism of renal origin: Secondary | ICD-10-CM | POA: Diagnosis not present

## 2022-06-13 DIAGNOSIS — D631 Anemia in chronic kidney disease: Secondary | ICD-10-CM | POA: Diagnosis not present

## 2022-06-13 DIAGNOSIS — Z992 Dependence on renal dialysis: Secondary | ICD-10-CM | POA: Diagnosis not present

## 2022-06-13 DIAGNOSIS — D509 Iron deficiency anemia, unspecified: Secondary | ICD-10-CM | POA: Diagnosis not present

## 2022-06-13 DIAGNOSIS — N186 End stage renal disease: Secondary | ICD-10-CM | POA: Diagnosis not present

## 2022-06-13 DIAGNOSIS — N250 Renal osteodystrophy: Secondary | ICD-10-CM | POA: Diagnosis not present

## 2022-06-14 ENCOUNTER — Encounter (HOSPITAL_COMMUNITY)
Admission: RE | Admit: 2022-06-14 | Discharge: 2022-06-14 | Disposition: A | Discharge status: Home / Self Care | Payer: Medicare Other | Source: Ambulatory Visit | Attending: Hematology | Admitting: Hematology

## 2022-06-14 ENCOUNTER — Other Ambulatory Visit: Payer: Self-pay | Admitting: Hematology

## 2022-06-14 DIAGNOSIS — K668 Other specified disorders of peritoneum: Secondary | ICD-10-CM | POA: Diagnosis not present

## 2022-06-14 DIAGNOSIS — C7A00 Malignant carcinoid tumor of unspecified site: Secondary | ICD-10-CM

## 2022-06-14 DIAGNOSIS — C787 Secondary malignant neoplasm of liver and intrahepatic bile duct: Secondary | ICD-10-CM | POA: Diagnosis not present

## 2022-06-14 DIAGNOSIS — C786 Secondary malignant neoplasm of retroperitoneum and peritoneum: Secondary | ICD-10-CM | POA: Insufficient documentation

## 2022-06-14 DIAGNOSIS — K769 Liver disease, unspecified: Secondary | ICD-10-CM | POA: Insufficient documentation

## 2022-06-14 DIAGNOSIS — M899 Disorder of bone, unspecified: Secondary | ICD-10-CM | POA: Diagnosis not present

## 2022-06-14 DIAGNOSIS — C801 Malignant (primary) neoplasm, unspecified: Secondary | ICD-10-CM | POA: Diagnosis not present

## 2022-06-14 LAB — CHROMOGRANIN A: Chromogranin A (ng/mL): 1197 ng/mL — ABNORMAL HIGH (ref 0.0–101.8)

## 2022-06-14 MED ORDER — COPPER CU 64 DOTATATE 1 MCI/ML IV SOLN
4.0000 | Freq: Once | INTRAVENOUS | Status: AC
  Administered 2022-06-14: 4.33 via INTRAVENOUS

## 2022-06-15 DIAGNOSIS — D631 Anemia in chronic kidney disease: Secondary | ICD-10-CM | POA: Diagnosis not present

## 2022-06-15 DIAGNOSIS — Z992 Dependence on renal dialysis: Secondary | ICD-10-CM | POA: Diagnosis not present

## 2022-06-15 DIAGNOSIS — N250 Renal osteodystrophy: Secondary | ICD-10-CM | POA: Diagnosis not present

## 2022-06-15 DIAGNOSIS — N186 End stage renal disease: Secondary | ICD-10-CM | POA: Diagnosis not present

## 2022-06-15 DIAGNOSIS — D509 Iron deficiency anemia, unspecified: Secondary | ICD-10-CM | POA: Diagnosis not present

## 2022-06-15 DIAGNOSIS — N2581 Secondary hyperparathyroidism of renal origin: Secondary | ICD-10-CM | POA: Diagnosis not present

## 2022-06-16 ENCOUNTER — Other Ambulatory Visit: Payer: Medicare Other

## 2022-06-16 DIAGNOSIS — H35033 Hypertensive retinopathy, bilateral: Secondary | ICD-10-CM | POA: Diagnosis not present

## 2022-06-17 DIAGNOSIS — Z992 Dependence on renal dialysis: Secondary | ICD-10-CM | POA: Diagnosis not present

## 2022-06-17 DIAGNOSIS — D509 Iron deficiency anemia, unspecified: Secondary | ICD-10-CM | POA: Diagnosis not present

## 2022-06-17 DIAGNOSIS — N186 End stage renal disease: Secondary | ICD-10-CM | POA: Diagnosis not present

## 2022-06-17 DIAGNOSIS — N250 Renal osteodystrophy: Secondary | ICD-10-CM | POA: Diagnosis not present

## 2022-06-17 DIAGNOSIS — D631 Anemia in chronic kidney disease: Secondary | ICD-10-CM | POA: Diagnosis not present

## 2022-06-20 DIAGNOSIS — D509 Iron deficiency anemia, unspecified: Secondary | ICD-10-CM | POA: Diagnosis not present

## 2022-06-20 DIAGNOSIS — Z992 Dependence on renal dialysis: Secondary | ICD-10-CM | POA: Diagnosis not present

## 2022-06-20 DIAGNOSIS — D631 Anemia in chronic kidney disease: Secondary | ICD-10-CM | POA: Diagnosis not present

## 2022-06-20 DIAGNOSIS — N250 Renal osteodystrophy: Secondary | ICD-10-CM | POA: Diagnosis not present

## 2022-06-20 DIAGNOSIS — N186 End stage renal disease: Secondary | ICD-10-CM | POA: Diagnosis not present

## 2022-06-21 ENCOUNTER — Inpatient Hospital Stay: Payer: Medicare Other

## 2022-06-21 ENCOUNTER — Ambulatory Visit: Payer: Medicare Other | Admitting: Hematology

## 2022-06-21 ENCOUNTER — Inpatient Hospital Stay: Payer: Medicare Other | Attending: Hematology | Admitting: Hematology

## 2022-06-21 VITALS — BP 123/77 | HR 88 | Temp 97.8°F | Resp 17 | Ht 77.0 in | Wt 167.2 lb

## 2022-06-21 DIAGNOSIS — C7A00 Malignant carcinoid tumor of unspecified site: Secondary | ICD-10-CM | POA: Diagnosis not present

## 2022-06-21 DIAGNOSIS — C7B02 Secondary carcinoid tumors of liver: Secondary | ICD-10-CM | POA: Insufficient documentation

## 2022-06-21 DIAGNOSIS — Z79899 Other long term (current) drug therapy: Secondary | ICD-10-CM | POA: Insufficient documentation

## 2022-06-21 DIAGNOSIS — D631 Anemia in chronic kidney disease: Secondary | ICD-10-CM | POA: Diagnosis not present

## 2022-06-21 DIAGNOSIS — C7A019 Malignant carcinoid tumor of the small intestine, unspecified portion: Secondary | ICD-10-CM | POA: Diagnosis not present

## 2022-06-21 DIAGNOSIS — Z992 Dependence on renal dialysis: Secondary | ICD-10-CM | POA: Insufficient documentation

## 2022-06-21 DIAGNOSIS — N186 End stage renal disease: Secondary | ICD-10-CM | POA: Diagnosis not present

## 2022-06-21 NOTE — Patient Instructions (Signed)
[facility]  Discharge Instructions  You were seen and examined today by Dr. Delton Coombes.  Your cancer has progressed. Dr. Delton Coombes has discussed new treatment in the form of injections. You will receive Lanreotide once every 4 weeks and Pegasys once every 2 weeks. We will give you those here in the [facility].  Follow-up as scheduled.   Thank you for choosing [facility] to provide your oncology and hematology care.   To afford each patient quality time with our provider, please arrive at least 15 minutes before your scheduled appointment time. You may need to reschedule your appointment if you arrive late (10 or more minutes). Arriving late affects you and other patients whose appointments are after yours.  Also, if you miss three or more appointments without notifying the office, you may be dismissed from the clinic at the provider's discretion.    Again, thank you for choosing [facility].  Our hope is that these requests will decrease the amount of time that you wait before being seen by our physicians.   If you have a lab appointment with the [facility] please come in thru the Main Entrance and check in at the main information desk.           _____________________________________________________________  Should you have questions after your visit to [facility], please contact our office at [phone] and follow the prompts.  Our office hours are 8:00 a.m. to 4:30 p.m. Monday - Thursday and 8:00 a.m. to 2:30 p.m. Friday.  Please note that voicemails left after 4:00 p.m. may not be returned until the following business day.  We are closed weekends and all major holidays.  You do have access to a nurse 24-7, just call the main number to the clinic [phone] and do not press any options, hold on the line and a nurse will answer the phone.    For prescription refill requests, have your pharmacy  contact our office and allow 72 hours.    Masks are optional in the cancer centers. If you would like for your care team to wear a mask while they are taking care of you, please let them know. You may have one support person who is at least 81 years old accompany you for your appointments.

## 2022-06-21 NOTE — Progress Notes (Signed)
No injection needed today per MD.

## 2022-06-21 NOTE — Progress Notes (Signed)
[place], [place] [address]   CLINIC:  Medical Oncology/Hematology  PCP:  [name], MD [place] / [place] [id number] [phone]   REASON FOR VISIT:  Follow-up for metastatic carcinoid tumor of small intestine  PRIOR THERAPY: Sandostatin monthly and everolimus discontinued on 02/01/2022  NGS Results: TMB-low, MSI-stable, no other targetable mutations  CURRENT THERAPY: Under work-up  BRIEF ONCOLOGIC HISTORY:  Oncology History   No history exists.    CANCER STAGING:  Cancer Staging  No matching staging information was found for the patient.  INTERVAL HISTORY:  Mr. Costas Sena Meeker, a 81 y.o. male, seen for follow-up of metastatic carcinoid tumor of the small bowel.  He does not report any abdominal pains.  No diarrhea, flushing or wheezing reported.  Energy levels are 75%.  He is having dialysis on Tuesday, Thursday and Saturday.  REVIEW OF SYSTEMS:  Review of Systems  Constitutional:  Negative for appetite change and fatigue.  Respiratory:  Negative for wheezing.   Gastrointestinal:  Negative for abdominal pain and diarrhea.  All other systems reviewed and are negative.   PAST MEDICAL/SURGICAL HISTORY:  Past Medical History:  Diagnosis Date   Anemia    Cancer ([facility])    right renal . Prostate- Radiation treatment   CHF (congestive heart failure) (HCC)    Chronic kidney disease    Dialysis T/TH/Sa   Edema    Heart murmur    "nothing to worry about"   Hyperlipidemia    Hypertension    Malignant carcinoid tumor ([facility]) 01/03/2016   Pneumonia    as a child   Past Surgical History:  Procedure Laterality Date   AV FISTULA PLACEMENT Left 10/20/2014   Procedure: Left Arm ARTERIOVENOUS (AV) FISTULA CREATION;  Surgeon: Elam Dutch, MD;  Location: [facility];  Service: Vascular;  Laterality: Left;   NEPHRECTOMY Right 2010   PERIPHERAL VASCULAR BALLOON ANGIOPLASTY  06/20/2019   Procedure: PERIPHERAL VASCULAR BALLOON  ANGIOPLASTY;  Surgeon: Elam Dutch, MD;  Location: [facility];  Service: Cardiovascular;;   REVISON OF ARTERIOVENOUS FISTULA Left 08/04/2019   Procedure: REVISON OF ARTERIOVENOUS FISTULA WITH SIDE BRANCH LIGATION;  Surgeon: Elam Dutch, MD;  Location: [facility];  Service: Vascular;  Laterality: Left;    SOCIAL HISTORY:  Social History   Socioeconomic History   Marital status: Married    Spouse name: Not on file   Number of children: Not on file   Years of education: Not on file   Highest education level: Not on file  Occupational History   Not on file  Tobacco Use   Smoking status: Never   Smokeless tobacco: Never  Vaping Use   Vaping Use: Never used  Substance and Sexual Activity   Alcohol use: No    Alcohol/week: 0.0 standard drinks of alcohol   Drug use: No   Sexual activity: Not on file  Other Topics Concern   Not on file  Social History Narrative   Not on file   Social Determinants of Health   Financial Resource Strain: Low Risk  (05/24/2020)   Overall Financial Resource Strain (CARDIA)    Difficulty of Paying Living Expenses: Not hard at all  Food Insecurity: No Food Insecurity (05/24/2020)   Hunger Vital Sign    Worried About Running Out of Food in the Last Year: Never true    Ran Out of Food in the Last Year: Never true  Transportation Needs:  No Transportation Needs (05/24/2020)   PRAPARE - Hydrologist (Medical): No    Lack of Transportation (Non-Medical): No  Physical Activity: Insufficiently Active (05/24/2020)   Exercise Vital Sign    Days of Exercise per Week: 7 days    Minutes of Exercise per Session: 10 min  Stress: No Stress Concern Present (05/24/2020)   [place]    Feeling of Stress : Not at all  Social Connections: Moderately Integrated (05/24/2020)   Social Connection and Isolation Panel [NHANES]    Frequency of Communication with Friends  and Family: More than three times a week    Frequency of Social Gatherings with Friends and Family: Once a week    Attends Religious Services: More than 4 times per year    Active Member of [employer] or Organizations: No    Attends Archivist Meetings: Never    Marital Status: Married  Human resources officer Violence: Not At Risk (05/24/2020)   Humiliation, Afraid, Rape, and Kick questionnaire    Fear of Current or Ex-Partner: No    Emotionally Abused: No    Physically Abused: No    Sexually Abused: No    FAMILY HISTORY:  Family History  Problem Relation Age of Onset   Cancer Mother    Hypertension Father    Cancer Sister     CURRENT MEDICATIONS:  Current Outpatient Medications  Medication Sig Dispense Refill   atorvastatin (LIPITOR) 80 MG tablet Take 80 mg by mouth daily at 6 PM.      Calcium Acetate 667 MG TABS Take 667-2,001 mg by mouth See admin instructions. Take 2001 mg with each meal and 667 mg with each snack     Calcium Carb-Cholecalciferol (CALCIUM 500 + D PO) Take 1 tablet by mouth in the morning, at noon, and at bedtime.     carvedilol (COREG) 6.25 MG tablet Take 6.25 mg by mouth 2 (two) times daily.     fexofenadine (ALLEGRA) 180 MG tablet Take 180 mg by mouth daily.     furosemide (LASIX) 40 MG tablet Take 40 mg by mouth every morning.     mirtazapine (REMERON) 7.5 MG tablet Take 7.5 mg by mouth at bedtime.     Omega-3 Fatty Acids (FISH OIL) 1000 MG CAPS Take 1,000 mg by mouth daily.      Riboflavin (B2) 100 MG TABS Take 100 mg by mouth daily.     sodium bicarbonate 650 MG tablet Take 650 mg by mouth 3 (three) times daily.     No current facility-administered medications for this visit.    ALLERGIES:  No Known Allergies  PHYSICAL EXAM:  Performance status (ECOG): 1 - Symptomatic but completely ambulatory  Vitals:   06/21/22 1357  BP: 123/77  Pulse: 88  Resp: 17  Temp: 97.8 F (36.6 C)  SpO2: 99%   Wt Readings from Last 3 Encounters:  06/21/22 167 lb  3.2 oz (75.8 kg)  03/13/22 168 lb 4.8 oz (76.3 kg)  02/10/22 185 lb (83.9 kg)   Physical Exam Vitals reviewed.  Constitutional:      Appearance: Normal appearance.  Cardiovascular:     Rate and Rhythm: Normal rate and regular rhythm.     Pulses: Normal pulses.     Heart sounds: Normal heart sounds.  Pulmonary:     Effort: Pulmonary effort is normal.     Breath sounds: Normal breath sounds.  Abdominal:     Palpations: Abdomen  is soft. There is no hepatomegaly, splenomegaly or mass.     Tenderness: There is no abdominal tenderness.  Neurological:     General: No focal deficit present.     Mental Status: He is alert and oriented to person, place, and time.  Psychiatric:        Mood and Affect: Mood normal.        Behavior: Behavior normal.      LABORATORY DATA:  I have reviewed the labs as listed.     Latest Ref Rng & Units 06/12/2022   12:01 PM 02/10/2022    8:03 AM 02/01/2022    8:09 AM  CBC  WBC 4.0 - 10.5 K/uL 6.4  5.0  3.0   Hemoglobin 13.0 - 17.0 g/dL 11.8  10.4  9.9   Hematocrit 39.0 - 52.0 % 36.6  33.1  30.9   Platelets 150 - 400 K/uL 109  135  90       Latest Ref Rng & Units 06/12/2022   12:01 PM 02/01/2022    8:09 AM 09/12/2021   10:12 AM  CMP  Glucose 70 - 99 mg/dL 85  166  86   BUN 8 - 23 mg/dL 36  33  54   Creatinine 0.61 - 1.24 mg/dL 6.07  6.02  8.11   Sodium 135 - 145 mmol/L 138  137  133   Potassium 3.5 - 5.1 mmol/L 3.5  3.3  3.9   Chloride 98 - 111 mmol/L 99  99  98   CO2 22 - 32 mmol/L _0 Calcium 8.9 - 10.3 mg/dL 9.4  8.2  8.4   Total Protein 6.5 - 8.1 g/dL 6.0  5.7  6.0   Total Bilirubin 0.3 - 1.2 mg/dL 0.9  0.9  0.6   Alkaline Phos 38 - 126 U/L 89  60  80   AST 15 - 41 U/L _1 ALT 0 - 44 U/L _2 DIAGNOSTIC IMAGING:  I have independently reviewed the scans and discussed with the patient. NM PET DOTATATE SKULL BASE TO MID THIGH  Result Date: 06/15/2022 CLINICAL DATA:  Subsequent treatment strategy for  neuroendocrine tumor. Metastatic well differentiated neuroendocrine tumor to liver peritoneum. EXAM: NUCLEAR MEDICINE PET SKULL BASE TO THIGH TECHNIQUE: 4.3 mCi copper 69 DOTATATE was injected intravenously. Full-ring PET imaging was performed from the skull base to thigh after the radiotracer. CT data was obtained and used for attenuation correction and anatomic localization. COMPARISON:  DOTATATE PET scan 01/27/2022 FINDINGS: NECK No radiotracer activity in neck lymph nodes. Incidental CT findings: None CHEST No radiotracer accumulation within mediastinal or hilar lymph nodes. No suspicious pulmonary nodules on the CT scan. Incidental CT finding:None ABDOMEN/PELVIS Largest lesion in the LEFT hepatic lobe is also increased in size. This lesion measures 4.0 (image 100/CT series 4) compared to 3.0 cm. SUV max equal 36.7 compared SUV max equal 36.6 comparison DOTATATE scan. Example lesions in the dome the RIGHT hepatic lobe with SUV max equal 38.9 compared SUV max equal 23.8. Subcapsular lesion in the anterior RIGHT hepatic lobe with SUV max equal 30.9 compared SUV max equal 14.1. The smaller hepatic lesions not identifiable on the noncontrast CT. No clear new lesions in the liver. A central mesenteric partially calcified mass measuring 3.7 cm (image 138) with SUV max equal 34 is unchanged in size from prior with SUV max equal 26. Superior LEFT of this  central mesenteric nodule is a new or increased radiotracer avid peritoneal nodule with small calcification measuring 18 mm image 132/4. SUV max equal 31 compared SUV max equal 17 same region on comparison exam. There is a new peritoneal nodule in the LEFT ventral peritoneal space along the ventral surface measuring 8 mm (image 130/4 with SUV max equal 26.4. Lesion the umbilicus with SUV max equal 20 compared SUV max equal 26. Implants in the deep peritoneal space measuring 3.1 cm compared to 3.3 cm with SUV max equal 43 compared SUV max equal 41. No bowel obstruction.  Physiologic activity noted in the liver, spleen, adrenal glands and kidneys. Incidental CT findings:None SKELETON Single focus of uptake in the medial LEFT clavicle with SUV max equal 5.7 is new from prior. Incidental CT findings:None IMPRESSION: 1. Mild progression of well differentiated neuroendocrine tumor metastasis within liver. Dominant lesion in the central LEFT hepatic lobe is increased in size but with similar radiotracer activity. Other lesions are similar to slightly increased in activity. 2. Mild progression of peritoneal metastasis with increased in size and radiotracer activity of the partially calcified lesion in the RIGHT upper abdomen. New peritoneal metastasis in the ventral LEFT peritoneal space. 3. Stable peritoneal metastasis in the central mesentery and deep pelvis. 4. Potential new radiotracer avid skeletal metastasis LEFT clavicle. 5. No bowel obstruction. Electronically Signed   By: Suzy Bouchard M.D.   On: 06/15/2022 10:16     ASSESSMENT:  1.  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  Everolimus 5 mg daily started on 05/17/2018.  Everolimus discontinued on 02/01/2022. - PET scan (01/27/2022): Clear progression of hepatic metastatic tumor with a large central left hepatic lesion.  Multiple additional smaller metastatic lesions are increased in activity.  Stable multifocal peritoneal and mesenteric metastasis with some increase in radiotracer activity.  No new metastatic disease. - Umbilical nodule biopsy (02/10/2022): Metastatic neuroendocrine tumor - NGS test: TMB-low, MSI-stable, no other targetable mutations.   2.  Normocytic anemia: -This is from combination of end-stage renal disease and relative iron deficiency and myelosuppression from everolimus.   3.  ESRD on HD: -Started on HD on 05/20/2019   PLAN:  1.  Malignant carcinoid tumor to the liver and peritoneum: - He does not have any symptoms. - Dotatate PET  scan (06/14/2022): Progression of well-differentiated neuroendocrine tumor within the liver with dominant lesion in the central left hepatic lobe increased by 1 cm.  Other lesions have increased SUV.  Central mesenteric calcified mass is stable.  New peritoneal nodule superior left of the central mesenteric nodule.  New peritoneal nodule in the left ventral peritoneal space.  Lesion of the umbilicus with SUV 20, previously 26.  Implants in the deep peritoneal space stable.  Single focus of uptake in the medial left clavicle with SUV 5.7. - Other options including Temodar and PRRT have not been studied in dialysis patients.  I have reached out to my colleagues at [facility] and they do not have any trials open. - Capecitabine can be given with dose reduction in dialysis patients. - I have also reviewed data from [place] which showed combining lanreotide with pegylated interferon alpha as shown higher responses compared to lanreotide/octreotide alone.  I think this is the safest option in this patient on dialysis.  Other options include the Keytruda but again with limited data and dialysis patients. - We talked about this regimen.  He is agreeable to start on treatment.  Likely  will start next week.  We will start with lanreotide once monthly and Pegasys 135 mcg every 2 weeks.  Will increase Pegasys dose as tolerated. - I will reevaluate him in 4 weeks.    2.  Normocytic anemia: - Continue Retacrit with hemodialysis.  Latest hemoglobin is 10.4.   3.  ESRD on HD: - Continue HD on Tuesday, Thursday and Saturday.     Orders placed this encounter:  Orders Placed This Encounter  Procedures   CBC with Differential   Comprehensive metabolic panel      Derek Jack, MD [place] [phone]

## 2022-06-22 ENCOUNTER — Telehealth: Payer: Self-pay | Admitting: Pharmacist

## 2022-06-22 ENCOUNTER — Telehealth: Payer: Self-pay

## 2022-06-22 ENCOUNTER — Other Ambulatory Visit (HOSPITAL_COMMUNITY): Payer: Self-pay

## 2022-06-22 DIAGNOSIS — D509 Iron deficiency anemia, unspecified: Secondary | ICD-10-CM | POA: Diagnosis not present

## 2022-06-22 DIAGNOSIS — N186 End stage renal disease: Secondary | ICD-10-CM | POA: Diagnosis not present

## 2022-06-22 DIAGNOSIS — Z992 Dependence on renal dialysis: Secondary | ICD-10-CM | POA: Diagnosis not present

## 2022-06-22 DIAGNOSIS — N250 Renal osteodystrophy: Secondary | ICD-10-CM | POA: Diagnosis not present

## 2022-06-22 DIAGNOSIS — C7A00 Malignant carcinoid tumor of unspecified site: Secondary | ICD-10-CM

## 2022-06-22 DIAGNOSIS — D631 Anemia in chronic kidney disease: Secondary | ICD-10-CM | POA: Diagnosis not present

## 2022-06-22 MED ORDER — "BD TB SYRINGE 27G X 1/2"" 1 ML MISC"
0 refills | Status: DC
  Filled 2022-06-22: qty 25, fill #0

## 2022-06-22 MED ORDER — PEGASYS 180 MCG/ML ~~LOC~~ SOLN
135.0000 ug | SUBCUTANEOUS | 1 refills | Status: DC
  Filled 2022-06-22: qty 2, 28d supply, fill #0

## 2022-06-22 MED ORDER — PEGASYS 180 MCG/ML ~~LOC~~ SOLN
135.0000 ug | SUBCUTANEOUS | 1 refills | Status: DC
  Filled 2022-06-22: qty 3, 56d supply, fill #0

## 2022-06-22 NOTE — Telephone Encounter (Signed)
Oral Oncology Pharmacist Encounter  Received new prescription for peginterferon alfa-2a (Pegasys) for the treatment of malignant carcinoid tumor to the liver and peritoneum, planned duration until disease progression or unacceptable drug toxicity.  CMP/CBC from 06/12/22 assessed, Patient is a dialysis patient so his SCr is elevated. Prescription dose and frequency assessed.   The recommendation for dosing in patient with ESRD requiring dialysis is as follows: 135 mcg subQ once weekly. If laboratory abnormalities or severe adverse reactions occur, reduce further to 90 mcg once weekly until signs and symptoms resolve; if the patient is still intolerant, discontinue  Current medication list in Epic reviewed, no DDIs with peginterferon alfa-2a identified:  Evaluated chart and no patient barriers to medication adherence identified.   Prescription has been e-scribed to the [facility] for benefits analysis and approval.  Oral Oncology Clinic will continue to follow for insurance authorization, copayment issues, initial counseling and start date.  Patient agreed to treatment on 06/21/22 per MD documentation.  Darl Pikes, PharmD, BCPS, BCOP, CPP Hematology/Oncology Clinical Pharmacist Practitioner [place]/AP [facility] [phone]  06/22/2022 11:46 AM

## 2022-06-22 NOTE — Telephone Encounter (Signed)
Oral Oncology Patient Advocate Encounter   Received notification that prior authorization for Pegasys is required.   PA submitted on 12.07.23  Key [id number]  Status is pending     Berdine Addison, [facility] Patient [place]  [phone] (phone) [phone] (fax) 06/22/2022 12:01 PM

## 2022-06-22 NOTE — Addendum Note (Signed)
Addended by: Renda Rolls A on: 06/22/2022 11:11 AM   Modules accepted: Orders

## 2022-06-23 ENCOUNTER — Telehealth: Payer: Self-pay

## 2022-06-23 NOTE — Telephone Encounter (Signed)
Oral Oncology Patient Advocate Encounter  Received notification that the request for prior authorization for Pegasys has been denied due to SEE ATTACHED.     Berdine Addison, [facility] Patient [place]  [phone] (phone) [phone] (fax) 06/23/2022 3:19 PM

## 2022-06-23 NOTE — Telephone Encounter (Signed)
Oral Oncology Pharmacist Encounter  Received notification that prior authorization for Pegasys was denied due to the indication of malignant carcinoid tumor of the small intestine, unspecified portioin is not an approved use under Medicare Part D benefit.  Appeal has been sent in on 06/23/2022. Will continue to follow-up on the appeal status.  Drema Halon, PharmD Hematology/Oncology Clinical Pharmacist [facility] [phone]

## 2022-06-23 NOTE — Telephone Encounter (Addendum)
Oral Oncology Patient Advocate Encounter   An urgent appeal for the prior authorization denial of Pegasys has been started by the pharmacist on 12.08.23.   This encounter will continue to be updated until final appeal determination.      Berdine Addison, [facility] Patient [place]  [phone] (phone) [phone] (fax) 06/23/2022 3:20 PM

## 2022-06-24 DIAGNOSIS — D631 Anemia in chronic kidney disease: Secondary | ICD-10-CM | POA: Diagnosis not present

## 2022-06-24 DIAGNOSIS — Z992 Dependence on renal dialysis: Secondary | ICD-10-CM | POA: Diagnosis not present

## 2022-06-24 DIAGNOSIS — D509 Iron deficiency anemia, unspecified: Secondary | ICD-10-CM | POA: Diagnosis not present

## 2022-06-24 DIAGNOSIS — N250 Renal osteodystrophy: Secondary | ICD-10-CM | POA: Diagnosis not present

## 2022-06-24 DIAGNOSIS — N186 End stage renal disease: Secondary | ICD-10-CM | POA: Diagnosis not present

## 2022-06-26 ENCOUNTER — Telehealth: Payer: Self-pay

## 2022-06-26 ENCOUNTER — Other Ambulatory Visit (HOSPITAL_COMMUNITY): Payer: Self-pay

## 2022-06-26 NOTE — Telephone Encounter (Signed)
Oral Oncology Patient Advocate Encounter  Prior Authorization for Pegasys has been approved.    PA# [id number]  Effective dates: 06/22/22 until further notice.  Patients co-pay is $627.42.    Berdine Addison, [facility] Patient [place]  [phone] (phone) [phone] (fax) 06/26/2022 1:10 PM

## 2022-06-26 NOTE — Telephone Encounter (Signed)
Oral Oncology Patient Advocate Encounter   Began application for assistance for Pegasys through pharma& Patient Assistance Program.   Application will be submitted upon completion of necessary supporting documentation.   Pharma&'s phone number [phone].   I will continue to check the status until final determination.   Berdine Addison, [facility] Patient [place]  [phone] (phone) [phone] (fax) 06/26/2022 3:46 PM

## 2022-06-27 DIAGNOSIS — Z992 Dependence on renal dialysis: Secondary | ICD-10-CM | POA: Diagnosis not present

## 2022-06-27 DIAGNOSIS — D509 Iron deficiency anemia, unspecified: Secondary | ICD-10-CM | POA: Diagnosis not present

## 2022-06-27 DIAGNOSIS — N250 Renal osteodystrophy: Secondary | ICD-10-CM | POA: Diagnosis not present

## 2022-06-27 DIAGNOSIS — D631 Anemia in chronic kidney disease: Secondary | ICD-10-CM | POA: Diagnosis not present

## 2022-06-27 DIAGNOSIS — N186 End stage renal disease: Secondary | ICD-10-CM | POA: Diagnosis not present

## 2022-06-27 NOTE — Telephone Encounter (Signed)
Oral Oncology Patient Advocate Encounter   Submitted application for assistance for Pegasys to pharma&.   Application submitted via e-fax to [phone]   Pharma&'s phone number [phone].   I will continue to check the status until final determination.   Berdine Addison, [facility] Patient [place]  [phone] (phone) [phone] (fax) 06/27/2022 3:20 PM

## 2022-06-27 NOTE — Telephone Encounter (Signed)
Called and spoke to patient's spouse. They would like to sign application at MD office today 06/27/22. Office has been notified and application sent. I will submit once returned.   Berdine Addison, [facility] Patient [place]  [phone] (phone) [phone] (fax) 06/27/2022 9:39 AM

## 2022-06-27 NOTE — Telephone Encounter (Signed)
Oral Oncology Pharmacist Encounter  Patients appeal for Pegasys was approved on 06/26/22.   Patient has a large copay so patient assistance will be started.   Drema Halon, PharmD Hematology/Oncology Clinical Pharmacist [facility] [phone]

## 2022-06-28 ENCOUNTER — Inpatient Hospital Stay: Payer: Medicare Other

## 2022-06-28 ENCOUNTER — Other Ambulatory Visit (HOSPITAL_COMMUNITY): Payer: Self-pay

## 2022-06-28 NOTE — Telephone Encounter (Signed)
Spoke with patient and informed them of denial for PAP. Patient wanted to go ahead and pay the $630 co-pay. I informed him I was not sure how long this therapy would last. I have reached out to MD office to advise on whether or not to change therapy.   Berdine Addison, [facility] Patient [place]  [phone] (phone) [phone] (fax) 06/28/2022 11:51 AM

## 2022-06-28 NOTE — Telephone Encounter (Signed)
Spoke to patient again and informed them that they would be on this medication for the foreseeable future. Patient indicated they would not be able to afford copay long term. I have notified MD office.   Berdine Addison, [facility] Patient [place]  [phone] (phone) [phone] (fax) 06/28/2022 1:14 PM

## 2022-06-28 NOTE — Telephone Encounter (Signed)
Called and spoke with representative and was informed that patient is not eligible through the Patient Assistance Program since insurance provided coverage. Pharma& does not offer any type of co-pay assistance.   Berdine Addison, [facility] Patient [place]  [phone] (phone) [phone] (fax) 06/28/2022 11:05 AM

## 2022-06-29 DIAGNOSIS — Z992 Dependence on renal dialysis: Secondary | ICD-10-CM | POA: Diagnosis not present

## 2022-06-29 DIAGNOSIS — D631 Anemia in chronic kidney disease: Secondary | ICD-10-CM | POA: Diagnosis not present

## 2022-06-29 DIAGNOSIS — N186 End stage renal disease: Secondary | ICD-10-CM | POA: Diagnosis not present

## 2022-06-29 DIAGNOSIS — N250 Renal osteodystrophy: Secondary | ICD-10-CM | POA: Diagnosis not present

## 2022-06-29 DIAGNOSIS — D509 Iron deficiency anemia, unspecified: Secondary | ICD-10-CM | POA: Diagnosis not present

## 2022-07-01 DIAGNOSIS — N250 Renal osteodystrophy: Secondary | ICD-10-CM | POA: Diagnosis not present

## 2022-07-01 DIAGNOSIS — D509 Iron deficiency anemia, unspecified: Secondary | ICD-10-CM | POA: Diagnosis not present

## 2022-07-01 DIAGNOSIS — Z992 Dependence on renal dialysis: Secondary | ICD-10-CM | POA: Diagnosis not present

## 2022-07-01 DIAGNOSIS — N186 End stage renal disease: Secondary | ICD-10-CM | POA: Diagnosis not present

## 2022-07-01 DIAGNOSIS — D631 Anemia in chronic kidney disease: Secondary | ICD-10-CM | POA: Diagnosis not present

## 2022-07-04 DIAGNOSIS — Z992 Dependence on renal dialysis: Secondary | ICD-10-CM | POA: Diagnosis not present

## 2022-07-04 DIAGNOSIS — N250 Renal osteodystrophy: Secondary | ICD-10-CM | POA: Diagnosis not present

## 2022-07-04 DIAGNOSIS — D631 Anemia in chronic kidney disease: Secondary | ICD-10-CM | POA: Diagnosis not present

## 2022-07-04 DIAGNOSIS — D509 Iron deficiency anemia, unspecified: Secondary | ICD-10-CM | POA: Diagnosis not present

## 2022-07-04 DIAGNOSIS — N186 End stage renal disease: Secondary | ICD-10-CM | POA: Diagnosis not present

## 2022-07-06 DIAGNOSIS — D631 Anemia in chronic kidney disease: Secondary | ICD-10-CM | POA: Diagnosis not present

## 2022-07-06 DIAGNOSIS — N186 End stage renal disease: Secondary | ICD-10-CM | POA: Diagnosis not present

## 2022-07-06 DIAGNOSIS — N250 Renal osteodystrophy: Secondary | ICD-10-CM | POA: Diagnosis not present

## 2022-07-06 DIAGNOSIS — Z992 Dependence on renal dialysis: Secondary | ICD-10-CM | POA: Diagnosis not present

## 2022-07-06 DIAGNOSIS — D509 Iron deficiency anemia, unspecified: Secondary | ICD-10-CM | POA: Diagnosis not present

## 2022-07-08 DIAGNOSIS — Z992 Dependence on renal dialysis: Secondary | ICD-10-CM | POA: Diagnosis not present

## 2022-07-08 DIAGNOSIS — N186 End stage renal disease: Secondary | ICD-10-CM | POA: Diagnosis not present

## 2022-07-08 DIAGNOSIS — D509 Iron deficiency anemia, unspecified: Secondary | ICD-10-CM | POA: Diagnosis not present

## 2022-07-08 DIAGNOSIS — D631 Anemia in chronic kidney disease: Secondary | ICD-10-CM | POA: Diagnosis not present

## 2022-07-08 DIAGNOSIS — N250 Renal osteodystrophy: Secondary | ICD-10-CM | POA: Diagnosis not present

## 2022-07-11 DIAGNOSIS — D509 Iron deficiency anemia, unspecified: Secondary | ICD-10-CM | POA: Diagnosis not present

## 2022-07-11 DIAGNOSIS — N186 End stage renal disease: Secondary | ICD-10-CM | POA: Diagnosis not present

## 2022-07-11 DIAGNOSIS — Z992 Dependence on renal dialysis: Secondary | ICD-10-CM | POA: Diagnosis not present

## 2022-07-11 DIAGNOSIS — D631 Anemia in chronic kidney disease: Secondary | ICD-10-CM | POA: Diagnosis not present

## 2022-07-11 DIAGNOSIS — N250 Renal osteodystrophy: Secondary | ICD-10-CM | POA: Diagnosis not present

## 2022-07-12 ENCOUNTER — Inpatient Hospital Stay: Payer: Medicare Other

## 2022-07-12 VITALS — BP 121/65 | HR 79 | Temp 98.2°F | Resp 18 | Wt 165.8 lb

## 2022-07-12 DIAGNOSIS — C7B02 Secondary carcinoid tumors of liver: Secondary | ICD-10-CM | POA: Diagnosis not present

## 2022-07-12 DIAGNOSIS — D631 Anemia in chronic kidney disease: Secondary | ICD-10-CM | POA: Diagnosis not present

## 2022-07-12 DIAGNOSIS — C7A00 Malignant carcinoid tumor of unspecified site: Secondary | ICD-10-CM | POA: Diagnosis not present

## 2022-07-12 DIAGNOSIS — Z79899 Other long term (current) drug therapy: Secondary | ICD-10-CM | POA: Diagnosis not present

## 2022-07-12 DIAGNOSIS — N186 End stage renal disease: Secondary | ICD-10-CM | POA: Diagnosis not present

## 2022-07-12 DIAGNOSIS — C7A019 Malignant carcinoid tumor of the small intestine, unspecified portion: Secondary | ICD-10-CM

## 2022-07-12 DIAGNOSIS — Z992 Dependence on renal dialysis: Secondary | ICD-10-CM | POA: Diagnosis not present

## 2022-07-12 LAB — CBC WITH DIFFERENTIAL/PLATELET
Abs Immature Granulocytes: 0.02 10*3/uL (ref 0.00–0.07)
Basophils Absolute: 0 10*3/uL (ref 0.0–0.1)
Basophils Relative: 0 %
Eosinophils Absolute: 0.3 10*3/uL (ref 0.0–0.5)
Eosinophils Relative: 4 %
HCT: 34.4 % — ABNORMAL LOW (ref 39.0–52.0)
Hemoglobin: 11.3 g/dL — ABNORMAL LOW (ref 13.0–17.0)
Immature Granulocytes: 0 %
Lymphocytes Relative: 8 %
Lymphs Abs: 0.5 10*3/uL — ABNORMAL LOW (ref 0.7–4.0)
MCH: 31 pg (ref 26.0–34.0)
MCHC: 32.8 g/dL (ref 30.0–36.0)
MCV: 94.2 fL (ref 80.0–100.0)
Monocytes Absolute: 0.6 10*3/uL (ref 0.1–1.0)
Monocytes Relative: 9 %
Neutro Abs: 5 10*3/uL (ref 1.7–7.7)
Neutrophils Relative %: 79 %
Platelets: 144 10*3/uL — ABNORMAL LOW (ref 150–400)
RBC: 3.65 MIL/uL — ABNORMAL LOW (ref 4.22–5.81)
RDW: 13.4 % (ref 11.5–15.5)
WBC: 6.3 10*3/uL (ref 4.0–10.5)
nRBC: 0 % (ref 0.0–0.2)

## 2022-07-12 LAB — COMPREHENSIVE METABOLIC PANEL
ALT: 10 U/L (ref 0–44)
AST: 20 U/L (ref 15–41)
Albumin: 3.2 g/dL — ABNORMAL LOW (ref 3.5–5.0)
Alkaline Phosphatase: 77 U/L (ref 38–126)
Anion gap: 9 (ref 5–15)
BUN: 47 mg/dL — ABNORMAL HIGH (ref 8–23)
CO2: 28 mmol/L (ref 22–32)
Calcium: 9.2 mg/dL (ref 8.9–10.3)
Chloride: 99 mmol/L (ref 98–111)
Creatinine, Ser: 5.57 mg/dL — ABNORMAL HIGH (ref 0.61–1.24)
GFR, Estimated: 10 mL/min — ABNORMAL LOW (ref >60)
Glucose, Bld: 88 mg/dL (ref 70–99)
Potassium: 3.6 mmol/L (ref 3.5–5.1)
Sodium: 136 mmol/L (ref 135–145)
Total Bilirubin: 0.5 mg/dL (ref 0.3–1.2)
Total Protein: 6.2 g/dL — ABNORMAL LOW (ref 6.5–8.1)

## 2022-07-12 MED ORDER — LANREOTIDE ACETATE 120 MG/0.5ML ~~LOC~~ SOLN
120.0000 mg | Freq: Once | SUBCUTANEOUS | Status: AC
  Administered 2022-07-12: 120 mg via SUBCUTANEOUS
  Filled 2022-07-12: qty 120

## 2022-07-12 MED ORDER — PEGINTERFERON ALFA-2A 180 MCG/ML ~~LOC~~ SOLN: 135.0000 ug | SUBCUTANEOUS | Status: DC

## 2022-07-12 NOTE — Progress Notes (Signed)
Allen Davenport presents today for Sandostatin injection per the provider's orders.  Stable during administration without incident; injection site WNL; see MAR for injection details.  Patient tolerated procedure well and without incident.  No questions or complaints noted at this time.

## 2022-07-12 NOTE — Patient Instructions (Signed)
[facility]  Discharge Instructions: Thank you for choosing [facility] to provide your oncology and hematology care.  If you have a lab appointment with the [facility], please come in thru the Main Entrance and check in at the main information desk.  Wear comfortable clothing and clothing appropriate for easy access to any Portacath or PICC line.   We strive to give you quality time with your provider. You may need to reschedule your appointment if you arrive late (15 or more minutes).  Arriving late affects you and other patients whose appointments are after yours.  Also, if you miss three or more appointments without notifying the office, you may be dismissed from the clinic at the provider's discretion.      For prescription refill requests, have your pharmacy contact our office and allow 72 hours for refills to be completed.    Today you received the following chemotherapy and/or immunotherapy agents sandostatin      To help prevent nausea and vomiting after your treatment, we encourage you to take your nausea medication as directed.  BELOW ARE SYMPTOMS THAT SHOULD BE REPORTED IMMEDIATELY: *FEVER GREATER THAN 100.4 F (38 C) OR HIGHER *CHILLS OR SWEATING *NAUSEA AND VOMITING THAT IS NOT CONTROLLED WITH YOUR NAUSEA MEDICATION *UNUSUAL SHORTNESS OF BREATH *UNUSUAL BRUISING OR BLEEDING *URINARY PROBLEMS (pain or burning when urinating, or frequent urination) *BOWEL PROBLEMS (unusual diarrhea, constipation, pain near the anus) TENDERNESS IN MOUTH AND THROAT WITH OR WITHOUT PRESENCE OF ULCERS (sore throat, sores in mouth, or a toothache) UNUSUAL RASH, SWELLING OR PAIN  UNUSUAL VAGINAL DISCHARGE OR ITCHING   Items with * indicate a potential emergency and should be followed up as soon as possible or go to the Emergency Department if any problems should occur.  Please show the CHEMOTHERAPY ALERT CARD or IMMUNOTHERAPY ALERT CARD at check-in to the  Emergency Department and triage nurse.  Should you have questions after your visit or need to cancel or reschedule your appointment, please contact [facility] [phone]  and follow the prompts.  Office hours are 8:00 a.m. to 4:30 p.m. Monday - Friday. Please note that voicemails left after 4:00 p.m. may not be returned until the following business day.  We are closed weekends and major holidays. You have access to a nurse at all times for urgent questions. Please call the main number to the clinic [phone] and follow the prompts.  For any non-urgent questions, you may also contact your provider using MyChart. We now offer e-Visits for anyone 80 and older to request care online for non-urgent symptoms. For details visit [email or web address].   Also download the MyChart app! Go to the app store, search "MyChart", open the app, select [facility], and log in with your MyChart username and password.

## 2022-07-13 DIAGNOSIS — D509 Iron deficiency anemia, unspecified: Secondary | ICD-10-CM | POA: Diagnosis not present

## 2022-07-13 DIAGNOSIS — N250 Renal osteodystrophy: Secondary | ICD-10-CM | POA: Diagnosis not present

## 2022-07-13 DIAGNOSIS — Z992 Dependence on renal dialysis: Secondary | ICD-10-CM | POA: Diagnosis not present

## 2022-07-13 DIAGNOSIS — D631 Anemia in chronic kidney disease: Secondary | ICD-10-CM | POA: Diagnosis not present

## 2022-07-13 DIAGNOSIS — N186 End stage renal disease: Secondary | ICD-10-CM | POA: Diagnosis not present

## 2022-07-15 DIAGNOSIS — N250 Renal osteodystrophy: Secondary | ICD-10-CM | POA: Diagnosis not present

## 2022-07-15 DIAGNOSIS — N186 End stage renal disease: Secondary | ICD-10-CM | POA: Diagnosis not present

## 2022-07-15 DIAGNOSIS — D631 Anemia in chronic kidney disease: Secondary | ICD-10-CM | POA: Diagnosis not present

## 2022-07-15 DIAGNOSIS — Z992 Dependence on renal dialysis: Secondary | ICD-10-CM | POA: Diagnosis not present

## 2022-07-15 DIAGNOSIS — D509 Iron deficiency anemia, unspecified: Secondary | ICD-10-CM | POA: Diagnosis not present

## 2022-07-16 DIAGNOSIS — Z992 Dependence on renal dialysis: Secondary | ICD-10-CM | POA: Diagnosis not present

## 2022-07-16 DIAGNOSIS — N186 End stage renal disease: Secondary | ICD-10-CM | POA: Diagnosis not present

## 2022-07-18 DIAGNOSIS — D509 Iron deficiency anemia, unspecified: Secondary | ICD-10-CM | POA: Diagnosis not present

## 2022-07-18 DIAGNOSIS — Z992 Dependence on renal dialysis: Secondary | ICD-10-CM | POA: Diagnosis not present

## 2022-07-18 DIAGNOSIS — D631 Anemia in chronic kidney disease: Secondary | ICD-10-CM | POA: Diagnosis not present

## 2022-07-18 DIAGNOSIS — N186 End stage renal disease: Secondary | ICD-10-CM | POA: Diagnosis not present

## 2022-07-18 DIAGNOSIS — N250 Renal osteodystrophy: Secondary | ICD-10-CM | POA: Diagnosis not present

## 2022-07-20 DIAGNOSIS — N186 End stage renal disease: Secondary | ICD-10-CM | POA: Diagnosis not present

## 2022-07-20 DIAGNOSIS — N250 Renal osteodystrophy: Secondary | ICD-10-CM | POA: Diagnosis not present

## 2022-07-20 DIAGNOSIS — D509 Iron deficiency anemia, unspecified: Secondary | ICD-10-CM | POA: Diagnosis not present

## 2022-07-20 DIAGNOSIS — Z992 Dependence on renal dialysis: Secondary | ICD-10-CM | POA: Diagnosis not present

## 2022-07-20 DIAGNOSIS — D631 Anemia in chronic kidney disease: Secondary | ICD-10-CM | POA: Diagnosis not present

## 2022-07-22 DIAGNOSIS — D509 Iron deficiency anemia, unspecified: Secondary | ICD-10-CM | POA: Diagnosis not present

## 2022-07-22 DIAGNOSIS — Z992 Dependence on renal dialysis: Secondary | ICD-10-CM | POA: Diagnosis not present

## 2022-07-22 DIAGNOSIS — D631 Anemia in chronic kidney disease: Secondary | ICD-10-CM | POA: Diagnosis not present

## 2022-07-22 DIAGNOSIS — N250 Renal osteodystrophy: Secondary | ICD-10-CM | POA: Diagnosis not present

## 2022-07-22 DIAGNOSIS — N186 End stage renal disease: Secondary | ICD-10-CM | POA: Diagnosis not present

## 2022-07-25 DIAGNOSIS — N186 End stage renal disease: Secondary | ICD-10-CM | POA: Diagnosis not present

## 2022-07-25 DIAGNOSIS — Z992 Dependence on renal dialysis: Secondary | ICD-10-CM | POA: Diagnosis not present

## 2022-07-25 DIAGNOSIS — D509 Iron deficiency anemia, unspecified: Secondary | ICD-10-CM | POA: Diagnosis not present

## 2022-07-25 DIAGNOSIS — N250 Renal osteodystrophy: Secondary | ICD-10-CM | POA: Diagnosis not present

## 2022-07-25 DIAGNOSIS — D631 Anemia in chronic kidney disease: Secondary | ICD-10-CM | POA: Diagnosis not present

## 2022-07-26 ENCOUNTER — Inpatient Hospital Stay: Payer: Medicare Other | Attending: Hematology | Admitting: Hematology

## 2022-07-26 ENCOUNTER — Inpatient Hospital Stay: Payer: Medicare Other

## 2022-07-26 VITALS — BP 120/85 | HR 80 | Temp 97.4°F | Resp 16 | Wt 167.9 lb

## 2022-07-26 DIAGNOSIS — C7A00 Malignant carcinoid tumor of unspecified site: Secondary | ICD-10-CM | POA: Insufficient documentation

## 2022-07-26 DIAGNOSIS — Z79899 Other long term (current) drug therapy: Secondary | ICD-10-CM | POA: Insufficient documentation

## 2022-07-26 DIAGNOSIS — C7B02 Secondary carcinoid tumors of liver: Secondary | ICD-10-CM | POA: Diagnosis not present

## 2022-07-26 DIAGNOSIS — C7A019 Malignant carcinoid tumor of the small intestine, unspecified portion: Secondary | ICD-10-CM

## 2022-07-26 LAB — COMPREHENSIVE METABOLIC PANEL
ALT: 10 U/L (ref 0–44)
AST: 20 U/L (ref 15–41)
Albumin: 3.3 g/dL — ABNORMAL LOW (ref 3.5–5.0)
Alkaline Phosphatase: 67 U/L (ref 38–126)
Anion gap: 12 (ref 5–15)
BUN: 46 mg/dL — ABNORMAL HIGH (ref 8–23)
CO2: 29 mmol/L (ref 22–32)
Calcium: 8.5 mg/dL — ABNORMAL LOW (ref 8.9–10.3)
Chloride: 93 mmol/L — ABNORMAL LOW (ref 98–111)
Creatinine, Ser: 5.18 mg/dL — ABNORMAL HIGH (ref 0.61–1.24)
GFR, Estimated: 11 mL/min — ABNORMAL LOW (ref >60)
Glucose, Bld: 184 mg/dL — ABNORMAL HIGH (ref 70–99)
Potassium: 3.3 mmol/L — ABNORMAL LOW (ref 3.5–5.1)
Sodium: 134 mmol/L — ABNORMAL LOW (ref 135–145)
Total Bilirubin: 0.6 mg/dL (ref 0.3–1.2)
Total Protein: 6.1 g/dL — ABNORMAL LOW (ref 6.5–8.1)

## 2022-07-26 LAB — CBC WITH DIFFERENTIAL/PLATELET
Abs Immature Granulocytes: 0.01 10*3/uL (ref 0.00–0.07)
Basophils Absolute: 0 10*3/uL (ref 0.0–0.1)
Basophils Relative: 1 %
Eosinophils Absolute: 0.4 10*3/uL (ref 0.0–0.5)
Eosinophils Relative: 8 %
HCT: 38 % — ABNORMAL LOW (ref 39.0–52.0)
Hemoglobin: 11.8 g/dL — ABNORMAL LOW (ref 13.0–17.0)
Immature Granulocytes: 0 %
Lymphocytes Relative: 7 %
Lymphs Abs: 0.3 10*3/uL — ABNORMAL LOW (ref 0.7–4.0)
MCH: 30 pg (ref 26.0–34.0)
MCHC: 31.1 g/dL (ref 30.0–36.0)
MCV: 96.7 fL (ref 80.0–100.0)
Monocytes Absolute: 0.3 10*3/uL (ref 0.1–1.0)
Monocytes Relative: 7 %
Neutro Abs: 3.5 10*3/uL (ref 1.7–7.7)
Neutrophils Relative %: 77 %
Platelets: 141 10*3/uL — ABNORMAL LOW (ref 150–400)
RBC: 3.93 MIL/uL — ABNORMAL LOW (ref 4.22–5.81)
RDW: 14.8 % (ref 11.5–15.5)
WBC: 4.6 10*3/uL (ref 4.0–10.5)
nRBC: 0 % (ref 0.0–0.2)

## 2022-07-26 MED ORDER — LANREOTIDE ACETATE 120 MG/0.5ML ~~LOC~~ SOLN
120.0000 mg | Freq: Once | SUBCUTANEOUS | Status: AC
  Administered 2022-07-26: 120 mg via SUBCUTANEOUS

## 2022-07-26 NOTE — Progress Notes (Signed)
[place], [place] [address]   CLINIC:  Medical Oncology/Hematology  PCP:  [name], MD [place] / [place] [address] [phone]   REASON FOR VISIT:  Follow-up for metastatic carcinoid tumor of small intestine  PRIOR THERAPY: Sandostatin monthly and everolimus discontinued on 02/01/2022  NGS Results: TMB-low, MSI-stable, no other targetable mutations  CURRENT THERAPY: Lanreotide every 2 weeks  BRIEF ONCOLOGIC HISTORY:  Oncology History   No history exists.    CANCER STAGING:  Cancer Staging  No matching staging information was found for the patient.  INTERVAL HISTORY:  [name], a 82 y.o. male, seen for follow-up of metastatic carcinoid tumor of the small bowel.  He received first dose of lanreotide on 07/12/2022.  He could not afford Pegasys because of high co-pay.  Denies any abdominal pains.  Denies any flushing, wheezing or diarrhea.  Energy levels are 75%.  Chronic constipation is stable.  REVIEW OF SYSTEMS:  Review of Systems  Constitutional:  Negative for appetite change and fatigue.  Respiratory:  Negative for wheezing.   Gastrointestinal:  Positive for constipation. Negative for abdominal pain and diarrhea.  All other systems reviewed and are negative.   PAST MEDICAL/SURGICAL HISTORY:  Past Medical History:  Diagnosis Date   Anemia    Cancer ([place])    right renal . Prostate- Radiation treatment   CHF (congestive heart failure) (HCC)    Chronic kidney disease    Dialysis T/TH/Sa   Edema    Heart murmur    "nothing to worry about"   Hyperlipidemia    Hypertension    Malignant carcinoid tumor ([facility]) 01/03/2016   Pneumonia    as a child   Past Surgical History:  Procedure Laterality Date   AV FISTULA PLACEMENT Left 10/20/2014   Procedure: Left Arm ARTERIOVENOUS (AV) FISTULA CREATION;  Surgeon: Elam Dutch, MD;  Location: [facility];  Service: Vascular;  Laterality: Left;   NEPHRECTOMY  Right 2010   PERIPHERAL VASCULAR BALLOON ANGIOPLASTY  06/20/2019   Procedure: PERIPHERAL VASCULAR BALLOON ANGIOPLASTY;  Surgeon: Elam Dutch, MD;  Location: [facility];  Service: Cardiovascular;;   REVISON OF ARTERIOVENOUS FISTULA Left 08/04/2019   Procedure: REVISON OF ARTERIOVENOUS FISTULA WITH SIDE BRANCH LIGATION;  Surgeon: Elam Dutch, MD;  Location[facility];  Service: Vascular;  Laterality: Left;    SOCIAL HISTORY:  Social History   Socioeconomic History   Marital status: Married    Spouse name: Not on file   Number of children: Not on file   Years of education: Not on file   Highest education level: Not on file  Occupational History   Not on file  Tobacco Use   Smoking status: Never   Smokeless tobacco: Never  Vaping Use   Vaping Use: Never used  Substance and Sexual Activity   Alcohol use: No    Alcohol/week: 0.0 standard drinks of alcohol   Drug use: No   Sexual activity: Not on file  Other Topics Concern   Not on file  Social History Narrative   Not on file   Social Determinants of Health   Financial Resource Strain: Low Risk  (05/24/2020)   Overall Financial Resource Strain (CARDIA)    Difficulty of Paying Living Expenses: Not hard at all  Food Insecurity: No Food Insecurity (05/24/2020)   Hunger Vital Sign    Worried About Running Out of Food in the Last Year: Never true  Ran Out of Food in the Last Year: Never true  Transportation Needs: No Transportation Needs (05/24/2020)   PRAPARE - Hydrologist (Medical): No    Lack of Transportation (Non-Medical): No  Physical Activity: Insufficiently Active (05/24/2020)   Exercise Vital Sign    Days of Exercise per Week: 7 days    Minutes of Exercise per Session: 10 min  Stress: No Stress Concern Present (05/24/2020)   [place]    Feeling of Stress : Not at all  Social Connections: Moderately Integrated  (05/24/2020)   Social Connection and Isolation Panel [NHANES]    Frequency of Communication with Friends and Family: More than three times a week    Frequency of Social Gatherings with Friends and Family: Once a week    Attends Religious Services: More than 4 times per year    Active Member of [employer] or Organizations: No    Attends Archivist Meetings: Never    Marital Status: Married  Human resources officer Violence: Not At Risk (05/24/2020)   Humiliation, Afraid, Rape, and Kick questionnaire    Fear of Current or Ex-Partner: No    Emotionally Abused: No    Physically Abused: No    Sexually Abused: No    FAMILY HISTORY:  Family History  Problem Relation Age of Onset   Cancer Mother    Hypertension Father    Cancer Sister     CURRENT MEDICATIONS:  Current Outpatient Medications  Medication Sig Dispense Refill   atorvastatin (LIPITOR) 80 MG tablet Take 80 mg by mouth daily at 6 PM.      Calcium Acetate 667 MG TABS Take 667-2,001 mg by mouth See admin instructions. Take 2001 mg with each meal and 667 mg with each snack     Calcium Carb-Cholecalciferol (CALCIUM 500 + D PO) Take 1 tablet by mouth in the morning, at noon, and at bedtime.     carvedilol (COREG) 6.25 MG tablet Take 6.25 mg by mouth 2 (two) times daily.     fexofenadine (ALLEGRA) 180 MG tablet Take 180 mg by mouth daily.     furosemide (LASIX) 40 MG tablet Take 40 mg by mouth every morning.     mirtazapine (REMERON) 7.5 MG tablet Take 7.5 mg by mouth at bedtime.     Omega-3 Fatty Acids (FISH OIL) 1000 MG CAPS Take 1,000 mg by mouth daily.      peginterferon alfa-2a (PEGASYS) 180 MCG/ML injection Inject 0.75 mLs (135 mcg total) into the skin every 14 (fourteen) days. 2 mL 1   Riboflavin (B2) 100 MG TABS Take 100 mg by mouth daily.     sodium bicarbonate 650 MG tablet Take 650 mg by mouth 3 (three) times daily.     TUBERCULIN SYR 1CC/27GX1/2" (B-D TB SYRINGE 1CC/27GX1/2") 27G X 1/2" 1 ML MISC Use to administer pegasys  every 14 days 25 each 0   No current facility-administered medications for this visit.    ALLERGIES:  No Known Allergies  PHYSICAL EXAM:  Performance status (ECOG): 1 - Symptomatic but completely ambulatory  Vitals:   07/26/22 1039  BP: 120/85  Pulse: 80  Resp: 16  Temp: (!) 97.4 F (36.3 C)  SpO2: 100%   Wt Readings from Last 3 Encounters:  07/26/22 167 lb 14.4 oz (76.2 kg)  07/12/22 165 lb 12.6 oz (75.2 kg)  06/21/22 167 lb 3.2 oz (75.8 kg)   Physical Exam Vitals reviewed.  Constitutional:  Appearance: Normal appearance.  Cardiovascular:     Rate and Rhythm: Normal rate and regular rhythm.     Pulses: Normal pulses.     Heart sounds: Normal heart sounds.  Pulmonary:     Effort: Pulmonary effort is normal.     Breath sounds: Normal breath sounds.  Abdominal:     Palpations: Abdomen is soft. There is no hepatomegaly, splenomegaly or mass.     Tenderness: There is no abdominal tenderness.  Neurological:     General: No focal deficit present.     Mental Status: He is alert and oriented to person, place, and time.  Psychiatric:        Mood and Affect: Mood normal.        Behavior: Behavior normal.      LABORATORY DATA:  I have reviewed the labs as listed.     Latest Ref Rng & Units 07/12/2022    1:29 PM 06/12/2022   12:01 PM 02/10/2022    8:03 AM  CBC  WBC 4.0 - 10.5 K/uL 6.3  6.4  5.0   Hemoglobin 13.0 - 17.0 g/dL 11.3  11.8  10.4   Hematocrit 39.0 - 52.0 % 34.4  36.6  33.1   Platelets 150 - 400 K/uL 144  109  135       Latest Ref Rng & Units 07/12/2022    1:29 PM 06/12/2022   12:01 PM 02/01/2022    8:09 AM  CMP  Glucose 70 - 99 mg/dL 88  85  166   BUN 8 - 23 mg/dL 47  36  33   Creatinine 0.61 - 1.24 mg/dL 5.57  6.07  6.02   Sodium 135 - 145 mmol/L 136  138  137   Potassium 3.5 - 5.1 mmol/L 3.6  3.5  3.3   Chloride 98 - 111 mmol/L 99  99  99   CO2 22 - 32 mmol/L '28  31  29   '$ Calcium 8.9 - 10.3 mg/dL 9.2  9.4  8.2   Total Protein 6.5 - 8.1  g/dL 6.2  6.0  5.7   Total Bilirubin 0.3 - 1.2 mg/dL 0.5  0.9  0.9   Alkaline Phos 38 - 126 U/L 77  89  60   AST 15 - 41 U/L '20  29  29   '$ ALT 0 - 44 U/L '10  18  16     '$ DIAGNOSTIC IMAGING:  I have independently reviewed the scans and discussed with the patient. No results found.   ASSESSMENT:  1.  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  Everolimus 5 mg daily started on 05/17/2018.  Everolimus discontinued on 02/01/2022. - PET scan (01/27/2022): Clear progression of hepatic metastatic tumor with a large central left hepatic lesion.  Multiple additional smaller metastatic lesions are increased in activity.  Stable multifocal peritoneal and mesenteric metastasis with some increase in radiotracer activity.  No new metastatic disease. - Umbilical nodule biopsy (02/10/2022): Metastatic neuroendocrine tumor - NGS test: TMB-low, MSI-stable, no other targetable mutations.  - Dotatate PET scan (06/14/2022): Progression of well-differentiated neuroendocrine tumor within the liver with dominant lesion in the central left hepatic lobe increased by 1 cm.  Other lesions have increased SUV.  Central mesenteric calcified mass is stable.  New peritoneal nodule superior left of the central mesenteric nodule.  New peritoneal nodule in the left ventral peritoneal space.  Lesion of the umbilicus with SUV 20, previously 26.  Implants  in the deep peritoneal space stable.  Single focus of uptake in the medial left clavicle with SUV 5.7. - Other options including Temodar and PRRT have not been studied in dialysis patients.  I have reached out to [facility] and they do not have any trials open.  Capecitabine can be given with dose reduction in dialysis patients. - We tried to get him on lanreotide with pegylated interferon.  He could not afford the high co-pay. - Lanreotide every 2 weeks started on 07/12/2022  2.  Normocytic anemia: -This is from  combination of end-stage renal disease and relative iron deficiency and myelosuppression from everolimus.   3.  ESRD on HD: -Started on HD on 05/20/2019   PLAN:  1.  Malignant carcinoid tumor to the liver and peritoneum: - He could not afford high co-pay associated with Pegasys. - I have reviewed literature.  Lanreotide given more frequently once every 2 weeks has shown improvement in PFS. (Eur J Cancer. 7290;211:155. Epub 2021 Sep 28) - We discussed the plan.  He is agreeable. - We will give day 15 dose of lanreotide today.  Continue lanreotide every 2 weeks.  RTC 8 weeks for follow-up with repeat chromogranin.  Will plan to repeat dotatate PET scan in 6 months.    2.  Normocytic anemia: - Continue Retacrit with hemodialysis.  Hemoglobin today is 11.8.   3.  ESRD on HD: - Continue HD on Tuesday, Thursday and Saturday.     Orders placed this encounter:  No orders of the defined types were placed in this encounter.     Derek Jack, MD [place] [phone]

## 2022-07-26 NOTE — Patient Instructions (Addendum)
[place] at [facility] Discharge Instructions   You were seen and examined today by Dr. Delton Coombes.  We will plan on increasing your injections to every 2 weeks.   Return as scheduled.    Thank you for choosing [place] at [facility] to provide your oncology and hematology care.  To afford each patient quality time with our provider, please arrive at least 15 minutes before your scheduled appointment time.   If you have a lab appointment with the [facility] please come in thru the Main Entrance and check in at the main information desk.  You need to re-schedule your appointment should you arrive 10 or more minutes late.  We strive to give you quality time with our providers, and arriving late affects you and other patients whose appointments are after yours.  Also, if you no show three or more times for appointments you may be dismissed from the clinic at the providers discretion.     Again, thank you for choosing [facility].  Our hope is that these requests will decrease the amount of time that you wait before being seen by our physicians.       _____________________________________________________________  Should you have questions after your visit to [facility], please contact our office at [phone] and follow the prompts.  Our office hours are 8:00 a.m. and 4:30 p.m. Monday - Friday.  Please note that voicemails left after 4:00 p.m. may not be returned until the following business day.  We are closed weekends and major holidays.  You do have access to a nurse 24-7, just call the main number to the clinic [phone] and do not press any options, hold on the line and a nurse will answer the phone.    For prescription refill requests, have your pharmacy contact our office and allow 72 hours.    Due to Covid, you will need to wear a mask upon entering the hospital. If you do not have a mask, a mask will  be given to you at the Main Entrance upon arrival. For doctor visits, patients may have 1 support person age 11 or older with them. For treatment visits, patients can not have anyone with them due to social distancing guidelines and our immunocompromised population.

## 2022-07-27 DIAGNOSIS — N250 Renal osteodystrophy: Secondary | ICD-10-CM | POA: Diagnosis not present

## 2022-07-27 DIAGNOSIS — D509 Iron deficiency anemia, unspecified: Secondary | ICD-10-CM | POA: Diagnosis not present

## 2022-07-27 DIAGNOSIS — Z992 Dependence on renal dialysis: Secondary | ICD-10-CM | POA: Diagnosis not present

## 2022-07-27 DIAGNOSIS — D631 Anemia in chronic kidney disease: Secondary | ICD-10-CM | POA: Diagnosis not present

## 2022-07-27 DIAGNOSIS — N186 End stage renal disease: Secondary | ICD-10-CM | POA: Diagnosis not present

## 2022-07-29 DIAGNOSIS — Z992 Dependence on renal dialysis: Secondary | ICD-10-CM | POA: Diagnosis not present

## 2022-07-29 DIAGNOSIS — N186 End stage renal disease: Secondary | ICD-10-CM | POA: Diagnosis not present

## 2022-07-29 DIAGNOSIS — N250 Renal osteodystrophy: Secondary | ICD-10-CM | POA: Diagnosis not present

## 2022-07-29 DIAGNOSIS — D509 Iron deficiency anemia, unspecified: Secondary | ICD-10-CM | POA: Diagnosis not present

## 2022-07-29 DIAGNOSIS — D631 Anemia in chronic kidney disease: Secondary | ICD-10-CM | POA: Diagnosis not present

## 2022-08-01 DIAGNOSIS — N250 Renal osteodystrophy: Secondary | ICD-10-CM | POA: Diagnosis not present

## 2022-08-01 DIAGNOSIS — N186 End stage renal disease: Secondary | ICD-10-CM | POA: Diagnosis not present

## 2022-08-01 DIAGNOSIS — D631 Anemia in chronic kidney disease: Secondary | ICD-10-CM | POA: Diagnosis not present

## 2022-08-01 DIAGNOSIS — Z992 Dependence on renal dialysis: Secondary | ICD-10-CM | POA: Diagnosis not present

## 2022-08-01 DIAGNOSIS — D509 Iron deficiency anemia, unspecified: Secondary | ICD-10-CM | POA: Diagnosis not present

## 2022-08-03 DIAGNOSIS — Z992 Dependence on renal dialysis: Secondary | ICD-10-CM | POA: Diagnosis not present

## 2022-08-03 DIAGNOSIS — N250 Renal osteodystrophy: Secondary | ICD-10-CM | POA: Diagnosis not present

## 2022-08-03 DIAGNOSIS — D509 Iron deficiency anemia, unspecified: Secondary | ICD-10-CM | POA: Diagnosis not present

## 2022-08-03 DIAGNOSIS — D631 Anemia in chronic kidney disease: Secondary | ICD-10-CM | POA: Diagnosis not present

## 2022-08-03 DIAGNOSIS — N186 End stage renal disease: Secondary | ICD-10-CM | POA: Diagnosis not present

## 2022-08-05 DIAGNOSIS — D631 Anemia in chronic kidney disease: Secondary | ICD-10-CM | POA: Diagnosis not present

## 2022-08-05 DIAGNOSIS — Z992 Dependence on renal dialysis: Secondary | ICD-10-CM | POA: Diagnosis not present

## 2022-08-05 DIAGNOSIS — N186 End stage renal disease: Secondary | ICD-10-CM | POA: Diagnosis not present

## 2022-08-05 DIAGNOSIS — D509 Iron deficiency anemia, unspecified: Secondary | ICD-10-CM | POA: Diagnosis not present

## 2022-08-05 DIAGNOSIS — N250 Renal osteodystrophy: Secondary | ICD-10-CM | POA: Diagnosis not present

## 2022-08-08 DIAGNOSIS — D509 Iron deficiency anemia, unspecified: Secondary | ICD-10-CM | POA: Diagnosis not present

## 2022-08-08 DIAGNOSIS — N250 Renal osteodystrophy: Secondary | ICD-10-CM | POA: Diagnosis not present

## 2022-08-08 DIAGNOSIS — Z992 Dependence on renal dialysis: Secondary | ICD-10-CM | POA: Diagnosis not present

## 2022-08-08 DIAGNOSIS — N186 End stage renal disease: Secondary | ICD-10-CM | POA: Diagnosis not present

## 2022-08-08 DIAGNOSIS — D631 Anemia in chronic kidney disease: Secondary | ICD-10-CM | POA: Diagnosis not present

## 2022-08-09 ENCOUNTER — Inpatient Hospital Stay: Payer: Medicare Other

## 2022-08-09 VITALS — BP 100/73 | HR 74 | Temp 98.0°F | Resp 16

## 2022-08-09 DIAGNOSIS — C7A00 Malignant carcinoid tumor of unspecified site: Secondary | ICD-10-CM | POA: Diagnosis not present

## 2022-08-09 DIAGNOSIS — C7B02 Secondary carcinoid tumors of liver: Secondary | ICD-10-CM | POA: Diagnosis not present

## 2022-08-09 DIAGNOSIS — Z79899 Other long term (current) drug therapy: Secondary | ICD-10-CM | POA: Diagnosis not present

## 2022-08-09 MED ORDER — LANREOTIDE ACETATE 120 MG/0.5ML ~~LOC~~ SOLN
120.0000 mg | Freq: Once | SUBCUTANEOUS | Status: AC
  Administered 2022-08-09: 120 mg via SUBCUTANEOUS

## 2022-08-10 DIAGNOSIS — D631 Anemia in chronic kidney disease: Secondary | ICD-10-CM | POA: Diagnosis not present

## 2022-08-10 DIAGNOSIS — Z992 Dependence on renal dialysis: Secondary | ICD-10-CM | POA: Diagnosis not present

## 2022-08-10 DIAGNOSIS — N186 End stage renal disease: Secondary | ICD-10-CM | POA: Diagnosis not present

## 2022-08-10 DIAGNOSIS — D509 Iron deficiency anemia, unspecified: Secondary | ICD-10-CM | POA: Diagnosis not present

## 2022-08-10 DIAGNOSIS — N250 Renal osteodystrophy: Secondary | ICD-10-CM | POA: Diagnosis not present

## 2022-08-14 DIAGNOSIS — N186 End stage renal disease: Secondary | ICD-10-CM | POA: Diagnosis not present

## 2022-08-14 DIAGNOSIS — N250 Renal osteodystrophy: Secondary | ICD-10-CM | POA: Diagnosis not present

## 2022-08-14 DIAGNOSIS — Z992 Dependence on renal dialysis: Secondary | ICD-10-CM | POA: Diagnosis not present

## 2022-08-14 DIAGNOSIS — D631 Anemia in chronic kidney disease: Secondary | ICD-10-CM | POA: Diagnosis not present

## 2022-08-14 DIAGNOSIS — D509 Iron deficiency anemia, unspecified: Secondary | ICD-10-CM | POA: Diagnosis not present

## 2022-08-15 DIAGNOSIS — N250 Renal osteodystrophy: Secondary | ICD-10-CM | POA: Diagnosis not present

## 2022-08-15 DIAGNOSIS — Z992 Dependence on renal dialysis: Secondary | ICD-10-CM | POA: Diagnosis not present

## 2022-08-15 DIAGNOSIS — D509 Iron deficiency anemia, unspecified: Secondary | ICD-10-CM | POA: Diagnosis not present

## 2022-08-15 DIAGNOSIS — D631 Anemia in chronic kidney disease: Secondary | ICD-10-CM | POA: Diagnosis not present

## 2022-08-15 DIAGNOSIS — N186 End stage renal disease: Secondary | ICD-10-CM | POA: Diagnosis not present

## 2022-08-16 DIAGNOSIS — N186 End stage renal disease: Secondary | ICD-10-CM | POA: Diagnosis not present

## 2022-08-16 DIAGNOSIS — Z992 Dependence on renal dialysis: Secondary | ICD-10-CM | POA: Diagnosis not present

## 2022-08-17 DIAGNOSIS — N186 End stage renal disease: Secondary | ICD-10-CM | POA: Diagnosis not present

## 2022-08-17 DIAGNOSIS — Z992 Dependence on renal dialysis: Secondary | ICD-10-CM | POA: Diagnosis not present

## 2022-08-17 DIAGNOSIS — N250 Renal osteodystrophy: Secondary | ICD-10-CM | POA: Diagnosis not present

## 2022-08-17 DIAGNOSIS — D509 Iron deficiency anemia, unspecified: Secondary | ICD-10-CM | POA: Diagnosis not present

## 2022-08-17 DIAGNOSIS — D631 Anemia in chronic kidney disease: Secondary | ICD-10-CM | POA: Diagnosis not present

## 2022-08-19 DIAGNOSIS — D509 Iron deficiency anemia, unspecified: Secondary | ICD-10-CM | POA: Diagnosis not present

## 2022-08-19 DIAGNOSIS — Z992 Dependence on renal dialysis: Secondary | ICD-10-CM | POA: Diagnosis not present

## 2022-08-19 DIAGNOSIS — N250 Renal osteodystrophy: Secondary | ICD-10-CM | POA: Diagnosis not present

## 2022-08-19 DIAGNOSIS — D631 Anemia in chronic kidney disease: Secondary | ICD-10-CM | POA: Diagnosis not present

## 2022-08-19 DIAGNOSIS — N186 End stage renal disease: Secondary | ICD-10-CM | POA: Diagnosis not present

## 2022-08-22 DIAGNOSIS — N250 Renal osteodystrophy: Secondary | ICD-10-CM | POA: Diagnosis not present

## 2022-08-22 DIAGNOSIS — N186 End stage renal disease: Secondary | ICD-10-CM | POA: Diagnosis not present

## 2022-08-22 DIAGNOSIS — D631 Anemia in chronic kidney disease: Secondary | ICD-10-CM | POA: Diagnosis not present

## 2022-08-22 DIAGNOSIS — D509 Iron deficiency anemia, unspecified: Secondary | ICD-10-CM | POA: Diagnosis not present

## 2022-08-22 DIAGNOSIS — Z992 Dependence on renal dialysis: Secondary | ICD-10-CM | POA: Diagnosis not present

## 2022-08-23 ENCOUNTER — Inpatient Hospital Stay: Payer: Medicare Other | Attending: Hematology

## 2022-08-23 VITALS — BP 117/72 | HR 77 | Temp 98.0°F | Resp 18

## 2022-08-23 DIAGNOSIS — C7B02 Secondary carcinoid tumors of liver: Secondary | ICD-10-CM | POA: Diagnosis not present

## 2022-08-23 DIAGNOSIS — C7A00 Malignant carcinoid tumor of unspecified site: Secondary | ICD-10-CM | POA: Insufficient documentation

## 2022-08-23 MED ORDER — LANREOTIDE ACETATE 120 MG/0.5ML ~~LOC~~ SOLN
120.0000 mg | Freq: Once | SUBCUTANEOUS | Status: AC
  Administered 2022-08-23: 120 mg via SUBCUTANEOUS
  Filled 2022-08-23: qty 120

## 2022-08-23 NOTE — Progress Notes (Signed)
Patient tolerated Lanreotide injection with no complaints voiced. Site clean and dry with no bruising or swelling noted at site. See MAR for details. Band aid applied.  Patient stable during and after injection. VSS with discharge and left in satisfactory condition with no s/s of distress noted.

## 2022-08-23 NOTE — Patient Instructions (Signed)
[facility]  Discharge Instructions: Thank you for choosing [facility] to provide your oncology and hematology care.  If you have a lab appointment with the Cancer Center, please come in thru the Main Entrance and check in at the main information desk.  Wear comfortable clothing and clothing appropriate for easy access to any Portacath or PICC line.   We strive to give you quality time with your provider. You may need to reschedule your appointment if you arrive late (15 or more minutes).  Arriving late affects you and other patients whose appointments are after yours.  Also, if you miss three or more appointments without notifying the office, you may be dismissed from the clinic at the provider's discretion.      For prescription refill requests, have your pharmacy contact our office and allow 72 hours for refills to be completed.    Today you received the following Lanreotide, return as scheduled.   To help prevent nausea and vomiting after your treatment, we encourage you to take your nausea medication as directed.  BELOW ARE SYMPTOMS THAT SHOULD BE REPORTED IMMEDIATELY: *FEVER GREATER THAN 100.4 F (38 C) OR HIGHER *CHILLS OR SWEATING *NAUSEA AND VOMITING THAT IS NOT CONTROLLED WITH YOUR NAUSEA MEDICATION *UNUSUAL SHORTNESS OF BREATH *UNUSUAL BRUISING OR BLEEDING *URINARY PROBLEMS (pain or burning when urinating, or frequent urination) *BOWEL PROBLEMS (unusual diarrhea, constipation, pain near the anus) TENDERNESS IN MOUTH AND THROAT WITH OR WITHOUT PRESENCE OF ULCERS (sore throat, sores in mouth, or a toothache) UNUSUAL RASH, SWELLING OR PAIN  UNUSUAL VAGINAL DISCHARGE OR ITCHING   Items with * indicate a potential emergency and should be followed up as soon as possible or go to the Emergency Department if any problems should occur.  Please show the CHEMOTHERAPY ALERT CARD or IMMUNOTHERAPY ALERT CARD at check-in to the Emergency Department and  triage nurse.  Should you have questions after your visit or need to cancel or reschedule your appointment, please contact [facility] [phone]  and follow the prompts.  Office hours are 8:00 a.m. to 4:30 p.m. Monday - Friday. Please note that voicemails left after 4:00 p.m. may not be returned until the following business day.  We are closed weekends and major holidays. You have access to a nurse at all times for urgent questions. Please call the main number to the clinic [phone] and follow the prompts.  For any non-urgent questions, you may also contact your provider using MyChart. We now offer e-Visits for anyone 18 and older to request care online for non-urgent symptoms. For details visit [email or web address].   Also download the MyChart app! Go to the app store, search "MyChart", open the app, select [facility], and log in with your MyChart username and password.   

## 2022-08-24 DIAGNOSIS — Z992 Dependence on renal dialysis: Secondary | ICD-10-CM | POA: Diagnosis not present

## 2022-08-24 DIAGNOSIS — N250 Renal osteodystrophy: Secondary | ICD-10-CM | POA: Diagnosis not present

## 2022-08-24 DIAGNOSIS — D631 Anemia in chronic kidney disease: Secondary | ICD-10-CM | POA: Diagnosis not present

## 2022-08-24 DIAGNOSIS — D509 Iron deficiency anemia, unspecified: Secondary | ICD-10-CM | POA: Diagnosis not present

## 2022-08-24 DIAGNOSIS — N186 End stage renal disease: Secondary | ICD-10-CM | POA: Diagnosis not present

## 2022-08-26 DIAGNOSIS — N250 Renal osteodystrophy: Secondary | ICD-10-CM | POA: Diagnosis not present

## 2022-08-26 DIAGNOSIS — Z992 Dependence on renal dialysis: Secondary | ICD-10-CM | POA: Diagnosis not present

## 2022-08-26 DIAGNOSIS — N186 End stage renal disease: Secondary | ICD-10-CM | POA: Diagnosis not present

## 2022-08-26 DIAGNOSIS — D631 Anemia in chronic kidney disease: Secondary | ICD-10-CM | POA: Diagnosis not present

## 2022-08-26 DIAGNOSIS — D509 Iron deficiency anemia, unspecified: Secondary | ICD-10-CM | POA: Diagnosis not present

## 2022-08-28 DIAGNOSIS — E1151 Type 2 diabetes mellitus with diabetic peripheral angiopathy without gangrene: Secondary | ICD-10-CM | POA: Diagnosis not present

## 2022-08-28 DIAGNOSIS — E1140 Type 2 diabetes mellitus with diabetic neuropathy, unspecified: Secondary | ICD-10-CM | POA: Diagnosis not present

## 2022-08-29 DIAGNOSIS — Z992 Dependence on renal dialysis: Secondary | ICD-10-CM | POA: Diagnosis not present

## 2022-08-29 DIAGNOSIS — D631 Anemia in chronic kidney disease: Secondary | ICD-10-CM | POA: Diagnosis not present

## 2022-08-29 DIAGNOSIS — N186 End stage renal disease: Secondary | ICD-10-CM | POA: Diagnosis not present

## 2022-08-29 DIAGNOSIS — N250 Renal osteodystrophy: Secondary | ICD-10-CM | POA: Diagnosis not present

## 2022-08-29 DIAGNOSIS — D509 Iron deficiency anemia, unspecified: Secondary | ICD-10-CM | POA: Diagnosis not present

## 2022-08-31 DIAGNOSIS — Z992 Dependence on renal dialysis: Secondary | ICD-10-CM | POA: Diagnosis not present

## 2022-08-31 DIAGNOSIS — J302 Other seasonal allergic rhinitis: Secondary | ICD-10-CM | POA: Diagnosis not present

## 2022-08-31 DIAGNOSIS — N250 Renal osteodystrophy: Secondary | ICD-10-CM | POA: Diagnosis not present

## 2022-08-31 DIAGNOSIS — I10 Essential (primary) hypertension: Secondary | ICD-10-CM | POA: Diagnosis not present

## 2022-08-31 DIAGNOSIS — D509 Iron deficiency anemia, unspecified: Secondary | ICD-10-CM | POA: Diagnosis not present

## 2022-08-31 DIAGNOSIS — D631 Anemia in chronic kidney disease: Secondary | ICD-10-CM | POA: Diagnosis not present

## 2022-08-31 DIAGNOSIS — E7849 Other hyperlipidemia: Secondary | ICD-10-CM | POA: Diagnosis not present

## 2022-08-31 DIAGNOSIS — N186 End stage renal disease: Secondary | ICD-10-CM | POA: Diagnosis not present

## 2022-08-31 DIAGNOSIS — N185 Chronic kidney disease, stage 5: Secondary | ICD-10-CM | POA: Diagnosis not present

## 2022-08-31 DIAGNOSIS — M818 Other osteoporosis without current pathological fracture: Secondary | ICD-10-CM | POA: Diagnosis not present

## 2022-09-02 DIAGNOSIS — N186 End stage renal disease: Secondary | ICD-10-CM | POA: Diagnosis not present

## 2022-09-02 DIAGNOSIS — D631 Anemia in chronic kidney disease: Secondary | ICD-10-CM | POA: Diagnosis not present

## 2022-09-02 DIAGNOSIS — D509 Iron deficiency anemia, unspecified: Secondary | ICD-10-CM | POA: Diagnosis not present

## 2022-09-02 DIAGNOSIS — N250 Renal osteodystrophy: Secondary | ICD-10-CM | POA: Diagnosis not present

## 2022-09-02 DIAGNOSIS — Z992 Dependence on renal dialysis: Secondary | ICD-10-CM | POA: Diagnosis not present

## 2022-09-05 DIAGNOSIS — Z992 Dependence on renal dialysis: Secondary | ICD-10-CM | POA: Diagnosis not present

## 2022-09-05 DIAGNOSIS — D631 Anemia in chronic kidney disease: Secondary | ICD-10-CM | POA: Diagnosis not present

## 2022-09-05 DIAGNOSIS — D509 Iron deficiency anemia, unspecified: Secondary | ICD-10-CM | POA: Diagnosis not present

## 2022-09-05 DIAGNOSIS — N250 Renal osteodystrophy: Secondary | ICD-10-CM | POA: Diagnosis not present

## 2022-09-05 DIAGNOSIS — N186 End stage renal disease: Secondary | ICD-10-CM | POA: Diagnosis not present

## 2022-09-06 ENCOUNTER — Inpatient Hospital Stay: Payer: Medicare Other

## 2022-09-06 VITALS — BP 114/81 | HR 79 | Temp 97.8°F | Resp 18

## 2022-09-06 DIAGNOSIS — C7B02 Secondary carcinoid tumors of liver: Secondary | ICD-10-CM | POA: Diagnosis not present

## 2022-09-06 DIAGNOSIS — C7A019 Malignant carcinoid tumor of the small intestine, unspecified portion: Secondary | ICD-10-CM

## 2022-09-06 DIAGNOSIS — C7A00 Malignant carcinoid tumor of unspecified site: Secondary | ICD-10-CM | POA: Diagnosis not present

## 2022-09-06 LAB — COMPREHENSIVE METABOLIC PANEL
ALT: 14 U/L (ref 0–44)
AST: 27 U/L (ref 15–41)
Albumin: 3.4 g/dL — ABNORMAL LOW (ref 3.5–5.0)
Alkaline Phosphatase: 79 U/L (ref 38–126)
Anion gap: 10 (ref 5–15)
BUN: 48 mg/dL — ABNORMAL HIGH (ref 8–23)
CO2: 28 mmol/L (ref 22–32)
Calcium: 7.9 mg/dL — ABNORMAL LOW (ref 8.9–10.3)
Chloride: 96 mmol/L — ABNORMAL LOW (ref 98–111)
Creatinine, Ser: 4.55 mg/dL — ABNORMAL HIGH (ref 0.61–1.24)
GFR, Estimated: 12 mL/min — ABNORMAL LOW (ref >60)
Glucose, Bld: 181 mg/dL — ABNORMAL HIGH (ref 70–99)
Potassium: 3.7 mmol/L (ref 3.5–5.1)
Sodium: 134 mmol/L — ABNORMAL LOW (ref 135–145)
Total Bilirubin: 0.9 mg/dL (ref 0.3–1.2)
Total Protein: 5.8 g/dL — ABNORMAL LOW (ref 6.5–8.1)

## 2022-09-06 LAB — CBC WITH DIFFERENTIAL/PLATELET
Abs Immature Granulocytes: 0.01 10*3/uL (ref 0.00–0.07)
Basophils Absolute: 0 10*3/uL (ref 0.0–0.1)
Basophils Relative: 1 %
Eosinophils Absolute: 0.2 10*3/uL (ref 0.0–0.5)
Eosinophils Relative: 6 %
HCT: 35.1 % — ABNORMAL LOW (ref 39.0–52.0)
Hemoglobin: 11.5 g/dL — ABNORMAL LOW (ref 13.0–17.0)
Immature Granulocytes: 0 %
Lymphocytes Relative: 9 %
Lymphs Abs: 0.4 10*3/uL — ABNORMAL LOW (ref 0.7–4.0)
MCH: 31.3 pg (ref 26.0–34.0)
MCHC: 32.8 g/dL (ref 30.0–36.0)
MCV: 95.6 fL (ref 80.0–100.0)
Monocytes Absolute: 0.4 10*3/uL (ref 0.1–1.0)
Monocytes Relative: 9 %
Neutro Abs: 3 10*3/uL (ref 1.7–7.7)
Neutrophils Relative %: 75 %
Platelets: 105 10*3/uL — ABNORMAL LOW (ref 150–400)
RBC: 3.67 MIL/uL — ABNORMAL LOW (ref 4.22–5.81)
RDW: 14.6 % (ref 11.5–15.5)
WBC: 3.9 10*3/uL — ABNORMAL LOW (ref 4.0–10.5)
nRBC: 0 % (ref 0.0–0.2)

## 2022-09-06 MED ORDER — LANREOTIDE ACETATE 120 MG/0.5ML ~~LOC~~ SOLN
120.0000 mg | Freq: Once | SUBCUTANEOUS | Status: AC
  Administered 2022-09-06: 120 mg via SUBCUTANEOUS
  Filled 2022-09-06: qty 120

## 2022-09-06 NOTE — Progress Notes (Signed)
Patient tolerated Lanreotide injection with no complaints voiced.  Site clean and dry with no bruising or swelling noted.  No complaints of pain.  Discharged with vital signs stable and no signs or symptoms of distress noted.

## 2022-09-06 NOTE — Patient Instructions (Signed)
[facility]  Discharge Instructions: Thank you for choosing [facility] to provide your oncology and hematology care.  If you have a lab appointment with the [facility], please come in thru the Main Entrance and check in at the main information desk.  Wear comfortable clothing and clothing appropriate for easy access to any Portacath or PICC line.   We strive to give you quality time with your provider. You may need to reschedule your appointment if you arrive late (15 or more minutes).  Arriving late affects you and other patients whose appointments are after yours.  Also, if you miss three or more appointments without notifying the office, you may be dismissed from the clinic at the provider's discretion.      For prescription refill requests, have your pharmacy contact our office and allow 72 hours for refills to be completed.    Today you received the following chemotherapy and/or immunotherapy agents Lanreotide.  Lanreotide Injection What is this medication? LANREOTIDE (lan REE oh tide) treats high levels of growth hormone (acromegaly). It is used when other therapies have not worked well enough or cannot be tolerated. It works by reducing the amount of growth hormone your body makes. This reduces symptoms and the risk of health problems caused by too much growth hormone, such as diabetes and heart disease. It may also be used to treat neuroendocrine tumors, a cancer of the cells that release hormones and other substances in your body. It works by slowing down the release of these substances from the cells. This slows tumor growth. It also decreases the symptoms of carcinoid syndrome, such as flushing or diarrhea. This medicine may be used for other purposes; ask your health care provider or pharmacist if you have questions. COMMON BRAND NAME(S): Somatuline [place] What should I tell my care team before I take this medication? They need to know if you have  any of these conditions: Diabetes Gallbladder disease Heart disease Kidney disease Liver disease Thyroid disease An unusual or allergic reaction to lanreotide, other medications, foods, dyes, or preservatives Pregnant or trying to get pregnant Breast-feeding How should I use this medication? This medication is injected under the skin. It is given by your care team in a hospital or clinic setting. Talk to your care team about the use of this medication in children. Special care may be needed. Overdosage: If you think you have taken too much of this medicine contact a poison control center or emergency room at once. NOTE: This medicine is only for you. Do not share this medicine with others. What if I miss a dose? Keep appointments for follow-up doses. It is important not to miss your dose. Call your care team if you are unable to keep an appointment. What may interact with this medication? Bromocriptine Cyclosporine Certain medications for blood pressure, heart disease, irregular heartbeat Certain medications for diabetes Quinidine Terfenadine This list may not describe all possible interactions. Give your health care provider a list of all the medicines, herbs, non-prescription drugs, or dietary supplements you use. Also tell them if you smoke, drink alcohol, or use illegal drugs. Some items may interact with your medicine. What should I watch for while using this medication? Visit your care team for regular checks on your progress. Tell your care team if your symptoms do not start to get better or if they get worse. Your condition will be monitored carefully while you are receiving this medication. You may need blood work while  you are taking this medication. This medication may increase blood sugar. The risk may be higher in patients who already have diabetes. Ask your care team what you can do to lower your risk of diabetes while taking this medication. Talk to your care team if you  wish to become pregnant or think you may be pregnant. This medication can cause serious birth defects. Do not breast-feed while taking this medication and for 6 months after stopping therapy. This medication may cause infertility. Talk to your care team if you are concerned about your fertility. What side effects may I notice from receiving this medication? Side effects that you should report to your care team as soon as possible: Allergic reactions--skin rash, itching, hives, swelling of the face, lips, tongue, or throat Gallbladder problems--severe stomach pain, nausea, vomiting, fever High blood sugar (hyperglycemia)--increased thirst or amount of urine, unusual weakness or fatigue, blurry vision Increase in blood pressure Low blood sugar (hypoglycemia)--tremors or shaking, anxiety, sweating, cold or clammy skin, confusion, dizziness, rapid heartbeat Low thyroid levels (hypothyroidism)--unusual weakness or fatigue, increased sensitivity to cold, constipation, hair loss, dry skin, weight gain, feelings of depression Slow heartbeat--dizziness, feeling faint or lightheaded, confusion, trouble breathing, unusual weakness or fatigue Side effects that usually do not require medical attention (report to your care team if they continue or are bothersome): Diarrhea Dizziness Headache Muscle spasms Nausea Pain, redness, irritation, or bruising at the injection site Stomach pain This list may not describe all possible side effects. Call your doctor for medical advice about side effects. You may report side effects to FDA at 1-800-FDA-1088. Where should I keep my medication? This medication is given in a hospital or clinic. It will not be stored at home. NOTE: This sheet is a summary. It may not cover all possible information. If you have questions about this medicine, talk to your doctor, pharmacist, or health care provider.  2023 Elsevier/Gold Standard (2021-09-02 00:00:00)        To help  prevent nausea and vomiting after your treatment, we encourage you to take your nausea medication as directed.  BELOW ARE SYMPTOMS THAT SHOULD BE REPORTED IMMEDIATELY: *FEVER GREATER THAN 100.4 F (38 C) OR HIGHER *CHILLS OR SWEATING *NAUSEA AND VOMITING THAT IS NOT CONTROLLED WITH YOUR NAUSEA MEDICATION *UNUSUAL SHORTNESS OF BREATH *UNUSUAL BRUISING OR BLEEDING *URINARY PROBLEMS (pain or burning when urinating, or frequent urination) *BOWEL PROBLEMS (unusual diarrhea, constipation, pain near the anus) TENDERNESS IN MOUTH AND THROAT WITH OR WITHOUT PRESENCE OF ULCERS (sore throat, sores in mouth, or a toothache) UNUSUAL RASH, SWELLING OR PAIN  UNUSUAL VAGINAL DISCHARGE OR ITCHING   Items with * indicate a potential emergency and should be followed up as soon as possible or go to the Emergency Department if any problems should occur.  Please show the CHEMOTHERAPY ALERT CARD or IMMUNOTHERAPY ALERT CARD at check-in to the Emergency Department and triage nurse.  Should you have questions after your visit or need to cancel or reschedule your appointment, please contact [facility] [phone]  and follow the prompts.  Office hours are 8:00 a.m. to 4:30 p.m. Monday - Friday. Please note that voicemails left after 4:00 p.m. may not be returned until the following business day.  We are closed weekends and major holidays. You have access to a nurse at all times for urgent questions. Please call the main number to the clinic [phone] and follow the prompts.  For any non-urgent questions, you may also contact your provider using MyChart. We  now offer e-Visits for anyone 91 and older to request care online for non-urgent symptoms. For details visit [email or web address].   Also download the MyChart app! Go to the app store, search "MyChart", open the app, select [facility], and log in with your MyChart username and password.

## 2022-09-07 DIAGNOSIS — N250 Renal osteodystrophy: Secondary | ICD-10-CM | POA: Diagnosis not present

## 2022-09-07 DIAGNOSIS — Z992 Dependence on renal dialysis: Secondary | ICD-10-CM | POA: Diagnosis not present

## 2022-09-07 DIAGNOSIS — D509 Iron deficiency anemia, unspecified: Secondary | ICD-10-CM | POA: Diagnosis not present

## 2022-09-07 DIAGNOSIS — N186 End stage renal disease: Secondary | ICD-10-CM | POA: Diagnosis not present

## 2022-09-07 DIAGNOSIS — D631 Anemia in chronic kidney disease: Secondary | ICD-10-CM | POA: Diagnosis not present

## 2022-09-08 LAB — CHROMOGRANIN A: Chromogranin A (ng/mL): 1074 ng/mL — ABNORMAL HIGH (ref 0.0–101.8)

## 2022-09-09 DIAGNOSIS — Z992 Dependence on renal dialysis: Secondary | ICD-10-CM | POA: Diagnosis not present

## 2022-09-09 DIAGNOSIS — N186 End stage renal disease: Secondary | ICD-10-CM | POA: Diagnosis not present

## 2022-09-09 DIAGNOSIS — D509 Iron deficiency anemia, unspecified: Secondary | ICD-10-CM | POA: Diagnosis not present

## 2022-09-09 DIAGNOSIS — N250 Renal osteodystrophy: Secondary | ICD-10-CM | POA: Diagnosis not present

## 2022-09-09 DIAGNOSIS — D631 Anemia in chronic kidney disease: Secondary | ICD-10-CM | POA: Diagnosis not present

## 2022-09-12 DIAGNOSIS — Z992 Dependence on renal dialysis: Secondary | ICD-10-CM | POA: Diagnosis not present

## 2022-09-12 DIAGNOSIS — N250 Renal osteodystrophy: Secondary | ICD-10-CM | POA: Diagnosis not present

## 2022-09-12 DIAGNOSIS — N186 End stage renal disease: Secondary | ICD-10-CM | POA: Diagnosis not present

## 2022-09-12 DIAGNOSIS — D509 Iron deficiency anemia, unspecified: Secondary | ICD-10-CM | POA: Diagnosis not present

## 2022-09-12 DIAGNOSIS — D631 Anemia in chronic kidney disease: Secondary | ICD-10-CM | POA: Diagnosis not present

## 2022-09-14 DIAGNOSIS — D509 Iron deficiency anemia, unspecified: Secondary | ICD-10-CM | POA: Diagnosis not present

## 2022-09-14 DIAGNOSIS — Z992 Dependence on renal dialysis: Secondary | ICD-10-CM | POA: Diagnosis not present

## 2022-09-14 DIAGNOSIS — D631 Anemia in chronic kidney disease: Secondary | ICD-10-CM | POA: Diagnosis not present

## 2022-09-14 DIAGNOSIS — N186 End stage renal disease: Secondary | ICD-10-CM | POA: Diagnosis not present

## 2022-09-14 DIAGNOSIS — N250 Renal osteodystrophy: Secondary | ICD-10-CM | POA: Diagnosis not present

## 2022-09-16 DIAGNOSIS — N186 End stage renal disease: Secondary | ICD-10-CM | POA: Diagnosis not present

## 2022-09-16 DIAGNOSIS — D509 Iron deficiency anemia, unspecified: Secondary | ICD-10-CM | POA: Diagnosis not present

## 2022-09-16 DIAGNOSIS — D631 Anemia in chronic kidney disease: Secondary | ICD-10-CM | POA: Diagnosis not present

## 2022-09-16 DIAGNOSIS — Z992 Dependence on renal dialysis: Secondary | ICD-10-CM | POA: Diagnosis not present

## 2022-09-16 DIAGNOSIS — N250 Renal osteodystrophy: Secondary | ICD-10-CM | POA: Diagnosis not present

## 2022-09-19 DIAGNOSIS — N186 End stage renal disease: Secondary | ICD-10-CM | POA: Diagnosis not present

## 2022-09-19 DIAGNOSIS — D509 Iron deficiency anemia, unspecified: Secondary | ICD-10-CM | POA: Diagnosis not present

## 2022-09-19 DIAGNOSIS — N250 Renal osteodystrophy: Secondary | ICD-10-CM | POA: Diagnosis not present

## 2022-09-19 DIAGNOSIS — Z992 Dependence on renal dialysis: Secondary | ICD-10-CM | POA: Diagnosis not present

## 2022-09-19 DIAGNOSIS — D631 Anemia in chronic kidney disease: Secondary | ICD-10-CM | POA: Diagnosis not present

## 2022-09-19 NOTE — Progress Notes (Signed)
[facility] [address], [place] [address]    Clinic Day:  09/20/2022  Referring physician: [name], MD  Patient Care Team: [name], MD as PCP - General (Internal Medicine) Derek Jack, MD as Medical Oncologist (Medical Oncology)   ASSESSMENT & PLAN:   Assessment: 1.  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  Everolimus 5 mg daily started on 05/17/2018.  Everolimus discontinued on 02/01/2022. - PET scan (01/27/2022): Clear progression of hepatic metastatic tumor with a large central left hepatic lesion.  Multiple additional smaller metastatic lesions are increased in activity.  Stable multifocal peritoneal and mesenteric metastasis with some increase in radiotracer activity.  No new metastatic disease. - Umbilical nodule biopsy (02/10/2022): Metastatic neuroendocrine tumor - NGS test: TMB-low, MSI-stable, no other targetable mutations.  - Dotatate PET scan (06/14/2022): Progression of well-differentiated neuroendocrine tumor within the liver with dominant lesion in the central left hepatic lobe increased by 1 cm.  Other lesions have increased SUV.  Central mesenteric calcified mass is stable.  New peritoneal nodule superior left of the central mesenteric nodule.  New peritoneal nodule in the left ventral peritoneal space.  Lesion of the umbilicus with SUV 20, previously 26.  Implants in the deep peritoneal space stable.  Single focus of uptake in the medial left clavicle with SUV 5.7. - Other options including Temodar and PRRT have not been studied in dialysis patients.  I have reached out to [facility] and they do not have any trials open.  Capecitabine can be given with dose reduction in dialysis patients. - We tried to get him on lanreotide with pegylated interferon.  He could not afford the high co-pay. - Lanreotide every 2 weeks started on 07/12/2022   2.  Normocytic  anemia: -This is from combination of end-stage renal disease and relative iron deficiency and myelosuppression from everolimus.   3.  ESRD on HD: -Started on HD on 05/20/2019  Plan: 1.  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 - He complained of constipation which is new.  I have recommended him to start taking stool softener. - Reviewed labs today which showed normal LFTs.  CBC shows mild leukopenia and thrombocytopenia stable.  Hemoglobin 11.5. - Serum chromogranin is 1074, down from 1197. - Denies any signs or symptoms of carcinoid syndrome. - Recommend continuing lanreotide every 2 weeks.  RTC 8 weeks for follow-up.  Will repeat chromogranin level prior to next visit.  Will plan on repeating PET scan in June.     2.  Normocytic anemia: - Continue Retacrit with hemodialysis.  Hemoglobin is 11.5 today.   3.  ESRD on HD: - Continue HD on Tuesday, Thursday and Saturday.  No orders of the defined types were placed in this encounter.     I,Alexis Herring,acting as a Education administrator for [employer], MD.,have documented all relevant documentation on the behalf of Derek Jack, MD,as directed by  Derek Jack, MD while in the presence of Derek Jack, MD.   I, Derek Jack MD, have reviewed the above documentation for accuracy and completeness, and I agree with the above.   Derek Jack, MD   3/6/20246:16 PM  CHIEF COMPLAINT:   Diagnosis: metastatic carcinoid tumor of small intestine    Cancer Staging  No matching staging information was found for the patient.   Prior Therapy: Sandostatin monthly and everolimus discontinued  on 02/01/2022   Current Therapy:  Lanreotide every 2 weeks    HISTORY OF PRESENT ILLNESS:   Oncology History   No history exists.     INTERVAL HISTORY:   Allen Davenport is a 82 y.o. male presenting to clinic today for follow up of metastatic  carcinoid tumor of small intestine. He was last seen by me on 07/26/22.  Today, he states that he is doing well overall. His appetite level is at 80%. His energy level is at 80%. He denies any diarrhea, flushing or wheezing.  He does report constipation which is new.  No abdominal pains reported.   PAST MEDICAL HISTORY:   Past Medical History: Past Medical History:  Diagnosis Date   Anemia    Cancer ([facility] [facility])    right renal . Prostate- Radiation treatment   CHF (congestive heart failure) (HCC)    Chronic kidney disease    Dialysis T/TH/Sa   Edema    Heart murmur    "nothing to worry about"   Hyperlipidemia    Hypertension    Malignant carcinoid tumor ([facility]) 01/03/2016   Pneumonia    as a child    Surgical History: Past Surgical History:  Procedure Laterality Date   AV FISTULA PLACEMENT Left 10/20/2014   Procedure: Left Arm ARTERIOVENOUS (AV) FISTULA CREATION;  Surgeon: Elam Dutch, MD;  Location: [facility];  Service: Vascular;  Laterality: Left;   NEPHRECTOMY Right 2010   PERIPHERAL VASCULAR BALLOON ANGIOPLASTY  06/20/2019   Procedure: PERIPHERAL VASCULAR BALLOON ANGIOPLASTY;  Surgeon: Elam Dutch, MD;  Location: [facility];  Service: Cardiovascular;;   REVISON OF ARTERIOVENOUS FISTULA Left 08/04/2019   Procedure: REVISON OF ARTERIOVENOUS FISTULA WITH SIDE BRANCH LIGATION;  Surgeon: Elam Dutch, MD;  Location: [facility];  Service: Vascular;  Laterality: Left;    Social History: Social History   Socioeconomic History   Marital status: Married    Spouse name: Not on file   Number of children: Not on file   Years of education: Not on file   Highest education level: Not on file  Occupational History   Not on file  Tobacco Use   Smoking status: Never   Smokeless tobacco: Never  Vaping Use   Vaping Use: Never used  Substance and Sexual Activity   Alcohol use: No    Alcohol/week: 0.0 standard drinks of alcohol   Drug use: No   Sexual activity: Not on file   Other Topics Concern   Not on file  Social History Narrative   Not on file   Social Determinants of Health   Financial Resource Strain: Low Risk  (05/24/2020)   Overall Financial Resource Strain (CARDIA)    Difficulty of Paying Living Expenses: Not hard at all  Food Insecurity: No Food Insecurity (05/24/2020)   Hunger Vital Sign    Worried About Running Out of Food in the Last Year: Never true    Ran Out of Food in the Last Year: Never true  Transportation Needs: No Transportation Needs (05/24/2020)   PRAPARE - Hydrologist (Medical): No    Lack of Transportation (Non-Medical): No  Physical Activity: Insufficiently Active (05/24/2020)   Exercise Vital Sign    Days of Exercise per Week: 7 days    Minutes of Exercise per Session: 10 min  Stress: No Stress Concern Present (05/24/2020)   [place]    Feeling of Stress : Not at all  Social Connections: Moderately Integrated (05/24/2020)   Social Connection and Isolation Panel [NHANES]    Frequency of Communication with Friends and Family: More than three times a week    Frequency of Social Gatherings with Friends and Family: Once a week    Attends Religious Services: More than 4 times per year    Active Member of [employer] or Organizations: No    Attends Archivist Meetings: Never    Marital Status: Married  Human resources officer Violence: Not At Risk (05/24/2020)   Humiliation, Afraid, Rape, and Kick questionnaire    Fear of Current or Ex-Partner: No    Emotionally Abused: No    Physically Abused: No    Sexually Abused: No    Family History: Family History  Problem Relation Age of Onset   Cancer Mother    Hypertension Father    Cancer Sister     Current Medications:  Current Outpatient Medications:    amLODipine (NORVASC) 10 MG tablet, Take 1 tablet every day by oral route., Disp: , Rfl:    atorvastatin (LIPITOR) 80 MG tablet,  Take 80 mg by mouth daily at 6 PM. , Disp: , Rfl:    Calcium Acetate 667 MG TABS, Take 667-2,001 mg by mouth See admin instructions. Take 2001 mg with each meal and 667 mg with each snack, Disp: , Rfl:    Calcium Carb-Cholecalciferol (CALCIUM 500 + D PO), Take 1 tablet by mouth in the morning, at noon, and at bedtime., Disp: , Rfl:    carvedilol (COREG) 6.25 MG tablet, Take 6.25 mg by mouth 2 (two) times daily., Disp: , Rfl:    fexofenadine (ALLEGRA) 180 MG tablet, Take 180 mg by mouth daily., Disp: , Rfl:    furosemide (LASIX) 40 MG tablet, Take 40 mg by mouth every morning., Disp: , Rfl:    mirtazapine (REMERON) 7.5 MG tablet, Take 7.5 mg by mouth at bedtime., Disp: , Rfl:    Omega-3 Fatty Acids (FISH OIL) 1000 MG CAPS, Take 1,000 mg by mouth daily. , Disp: , Rfl:    peginterferon alfa-2a (PEGASYS) 180 MCG/ML injection, Inject 0.75 mLs (135 mcg total) into the skin every 14 (fourteen) days., Disp: 2 mL, Rfl: 1   Riboflavin (B2) 100 MG TABS, Take 100 mg by mouth daily., Disp: , Rfl:    sodium bicarbonate 650 MG tablet, Take 650 mg by mouth 3 (three) times daily., Disp: , Rfl:    sodium bicarbonate 650 MG tablet, Take 1 tablet by mouth 3 (three) times daily., Disp: , Rfl:    TUBERCULIN SYR 1CC/27GX1/2" (B-D TB SYRINGE 1CC/27GX1/2") 27G X 1/2" 1 ML MISC, Use to administer pegasys every 14 days, Disp: 25 each, Rfl: 0   Allergies: No Known Allergies  REVIEW OF SYSTEMS:   Review of Systems  Constitutional:  Negative for chills, fatigue and fever.  HENT:   Negative for lump/mass, mouth sores, nosebleeds, sore throat and trouble swallowing.   Eyes:  Negative for eye problems.  Respiratory:  Negative for cough and shortness of breath.   Cardiovascular:  Negative for chest pain, leg swelling and palpitations.  Gastrointestinal:  Positive for constipation. Negative for abdominal pain, diarrhea, nausea and vomiting.  Genitourinary:  Negative for bladder incontinence, difficulty urinating, dysuria,  frequency, hematuria and nocturia.   Musculoskeletal:  Negative for arthralgias, back pain, flank pain, myalgias and neck pain.  Skin:  Negative for itching and rash.  Neurological:  Negative for dizziness, headaches and numbness.  Hematological:  Does not bruise/bleed easily.  Psychiatric/Behavioral:  Negative for depression, sleep disturbance and suicidal ideas. The patient is not nervous/anxious.   All other systems reviewed and are negative.    VITALS:   Blood pressure 124/63, pulse 81, temperature 97.7 F (36.5 C), temperature source Oral, resp. rate 16, weight 165 lb 9.6 oz (75.1 kg), SpO2 100 %.  Wt Readings from Last 3 Encounters:  09/20/22 165 lb 9.6 oz (75.1 kg)  07/26/22 167 lb 14.4 oz (76.2 kg)  07/12/22 165 lb 12.6 oz (75.2 kg)    Body mass index is 19.64 kg/m.  Performance status (ECOG): 1 - Symptomatic but completely ambulatory  PHYSICAL EXAM:   Physical Exam Vitals and nursing note reviewed. Exam conducted with a chaperone present.  Constitutional:      Appearance: Normal appearance.  Cardiovascular:     Rate and Rhythm: Normal rate and regular rhythm.     Pulses: Normal pulses.     Heart sounds: Normal heart sounds.  Pulmonary:     Effort: Pulmonary effort is normal.     Breath sounds: Normal breath sounds.  Abdominal:     Palpations: Abdomen is soft. There is no hepatomegaly, splenomegaly or mass.     Tenderness: There is no abdominal tenderness.  Musculoskeletal:     Right lower leg: No edema.     Left lower leg: No edema.  Lymphadenopathy:     Cervical: No cervical adenopathy.     Right cervical: No superficial, deep or posterior cervical adenopathy.    Left cervical: No superficial, deep or posterior cervical adenopathy.     Upper Body:     Right upper body: No supraclavicular or axillary adenopathy.     Left upper body: No supraclavicular or axillary adenopathy.  Neurological:     General: No focal deficit present.     Mental Status: He is  alert and oriented to person, place, and time.  Psychiatric:        Mood and Affect: Mood normal.        Behavior: Behavior normal.     LABS:      Latest Ref Rng & Units 09/06/2022   10:15 AM 07/26/2022   10:25 AM 07/12/2022    1:29 PM  CBC  WBC 4.0 - 10.5 K/uL 3.9  4.6  6.3   Hemoglobin 13.0 - 17.0 g/dL 11.5  11.8  11.3   Hematocrit 39.0 - 52.0 % 35.1  38.0  34.4   Platelets 150 - 400 K/uL 105  141  144       Latest Ref Rng & Units 09/06/2022   10:15 AM 07/26/2022   10:25 AM 07/12/2022    1:29 PM  CMP  Glucose 70 - 99 mg/dL 181  184  88   BUN 8 - 23 mg/dL 48  46  47   Creatinine 0.61 - 1.24 mg/dL 4.55  5.18  5.57   Sodium 135 - 145 mmol/L 134  134  136   Potassium 3.5 - 5.1 mmol/L 3.7  3.3  3.6   Chloride 98 - 111 mmol/L 96  93  99   CO2 22 - 32 mmol/L '28  29  28   '$ Calcium 8.9 - 10.3 mg/dL 7.9  8.5  9.2   Total Protein 6.5 - 8.1 g/dL 5.8  6.1  6.2   Total Bilirubin 0.3 - 1.2 mg/dL 0.9  0.6  0.5   Alkaline Phos 38 - 126 U/L 79  67  77   AST 15 - 41 U/L 27  20  20   ALT 0 - 44 U/L '14  10  10      '$ No results found for: "CEA1", "CEA" / No results found for: "CEA1", "CEA" No results found for: "PSA1" No results found for: "[id number]" No results found for: "CAN125"  No results found for: "TOTALPROTELP", "ALBUMINELP", "A1GS", "A2GS", "BETS", "BETA2SER", "GAMS", "MSPIKE", "SPEI" Lab Results  Component Value Date   TIBC 226 (L) 08/06/2019   TIBC 229 (L) 06/30/2019   TIBC 175 (L) 06/06/2019   FERRITIN 454 (H) 08/06/2019   FERRITIN 466 (H) 06/30/2019   FERRITIN 767 (H) 06/06/2019   IRONPCTSAT 50 (H) 08/06/2019   IRONPCTSAT 34 06/30/2019   IRONPCTSAT 9 (L) 06/06/2019   Lab Results  Component Value Date   LDH 131 06/12/2022   LDH 163 09/12/2021   LDH 153 07/20/2021     STUDIES:   No results found.

## 2022-09-20 ENCOUNTER — Inpatient Hospital Stay: Payer: Medicare Other | Admitting: Hematology

## 2022-09-20 ENCOUNTER — Inpatient Hospital Stay: Payer: Medicare Other | Attending: Hematology

## 2022-09-20 ENCOUNTER — Inpatient Hospital Stay (HOSPITAL_BASED_OUTPATIENT_CLINIC_OR_DEPARTMENT_OTHER): Payer: Medicare Other | Admitting: Hematology

## 2022-09-20 VITALS — BP 124/63 | HR 81 | Temp 97.7°F | Resp 16 | Wt 165.6 lb

## 2022-09-20 DIAGNOSIS — D3A00 Benign carcinoid tumor of unspecified site: Secondary | ICD-10-CM

## 2022-09-20 DIAGNOSIS — C7A00 Malignant carcinoid tumor of unspecified site: Secondary | ICD-10-CM | POA: Insufficient documentation

## 2022-09-20 DIAGNOSIS — C7B02 Secondary carcinoid tumors of liver: Secondary | ICD-10-CM | POA: Diagnosis not present

## 2022-09-20 MED ORDER — LANREOTIDE ACETATE 120 MG/0.5ML ~~LOC~~ SOLN
120.0000 mg | Freq: Once | SUBCUTANEOUS | Status: AC
  Administered 2022-09-20: 120 mg via SUBCUTANEOUS
  Filled 2022-09-20: qty 120

## 2022-09-20 NOTE — Patient Instructions (Addendum)
[facility] at [facility] Discharge Instructions   You were seen and examined today by Dr. Delton Coombes.  He reviewed the results of your lab work which are normal/stable. Your tumor marker is stable.   We will proceed with your injection today and every 2 weeks.  Return as scheduled.    Thank you for choosing [facility] at [facility] to provide your oncology and hematology care.  To afford each patient quality time with our provider, please arrive at least 15 minutes before your scheduled appointment time.   If you have a lab appointment with the [facility] please come in thru the Main Entrance and check in at the main information desk.  You need to re-schedule your appointment should you arrive 10 or more minutes late.  We strive to give you quality time with our providers, and arriving late affects you and other patients whose appointments are after yours.  Also, if you no show three or more times for appointments you may be dismissed from the clinic at the providers discretion.     Again, thank you for choosing [facility]  Our hope is that these requests will decrease the amount of time that you wait before being seen by our physicians.       _____________________________________________________________  Should you have questions after your visit to [facility], please contact our office at [phone] and follow the prompts.  Our office hours are 8:00 a.m. and 4:30 p.m. Monday - Friday.  Please note that voicemails left after 4:00 p.m. may not be returned until the following business day.  We are closed weekends and major holidays.  You do have access to a nurse 24-7, just call the main number to the clinic [phone] and do not press any options, hold on the line and a nurse will answer the phone.    For prescription refill requests, have your pharmacy contact our office and allow 72 hours.    Due to  Covid, you will need to wear a mask upon entering the hospital. If you do not have a mask, a mask will be given to you at the Main Entrance upon arrival. For doctor visits, patients may have 1 support person age 74 or older with them. For treatment visits, patients can not have anyone with them due to social distancing guidelines and our immunocompromised population.

## 2022-09-20 NOTE — Patient Instructions (Signed)
[facility]  Discharge Instructions: Thank you for choosing [facility] to provide your oncology and hematology care.  If you have a lab appointment with the [facility], please come in thru the Main Entrance and check in at the main information desk.  Wear comfortable clothing and clothing appropriate for easy access to any Portacath or PICC line.   We strive to give you quality time with your provider. You may need to reschedule your appointment if you arrive late (15 or more minutes).  Arriving late affects you and other patients whose appointments are after yours.  Also, if you miss three or more appointments without notifying the office, you may be dismissed from the clinic at the provider's discretion.      For prescription refill requests, have your pharmacy contact our office and allow 72 hours for refills to be completed.    Today you received the following chemotherapy and/or immunotherapy agents Lanreotide      To help prevent nausea and vomiting after your treatment, we encourage you to take your nausea medication as directed.  BELOW ARE SYMPTOMS THAT SHOULD BE REPORTED IMMEDIATELY: *FEVER GREATER THAN 100.4 F (38 C) OR HIGHER *CHILLS OR SWEATING *NAUSEA AND VOMITING THAT IS NOT CONTROLLED WITH YOUR NAUSEA MEDICATION *UNUSUAL SHORTNESS OF BREATH *UNUSUAL BRUISING OR BLEEDING *URINARY PROBLEMS (pain or burning when urinating, or frequent urination) *BOWEL PROBLEMS (unusual diarrhea, constipation, pain near the anus) TENDERNESS IN MOUTH AND THROAT WITH OR WITHOUT PRESENCE OF ULCERS (sore throat, sores in mouth, or a toothache) UNUSUAL RASH, SWELLING OR PAIN  UNUSUAL VAGINAL DISCHARGE OR ITCHING   Items with * indicate a potential emergency and should be followed up as soon as possible or go to the Emergency Department if any problems should occur.  Please show the CHEMOTHERAPY ALERT CARD or IMMUNOTHERAPY ALERT CARD at check-in to the  Emergency Department and triage nurse.  Should you have questions after your visit or need to cancel or reschedule your appointment, please contact [facility] [phone]  and follow the prompts.  Office hours are 8:00 a.m. to 4:30 p.m. Monday - Friday. Please note that voicemails left after 4:00 p.m. may not be returned until the following business day.  We are closed weekends and major holidays. You have access to a nurse at all times for urgent questions. Please call the main number to the clinic [phone] and follow the prompts.  For any non-urgent questions, you may also contact your provider using MyChart. We now offer e-Visits for anyone 34 and older to request care online for non-urgent symptoms. For details visit [email or web address].   Also download the MyChart app! Go to the app store, search "MyChart", open the app, select [facility], and log in with your MyChart username and password.

## 2022-09-20 NOTE — Progress Notes (Signed)
Allen Davenport presents today for Lanreotide injection per the provider's orders.  Stable during administration without incident; injection site WNL; see MAR for injection details.  Patient tolerated procedure well and without incident.  No questions or complaints noted at this time.

## 2022-09-21 DIAGNOSIS — N250 Renal osteodystrophy: Secondary | ICD-10-CM | POA: Diagnosis not present

## 2022-09-21 DIAGNOSIS — Z992 Dependence on renal dialysis: Secondary | ICD-10-CM | POA: Diagnosis not present

## 2022-09-21 DIAGNOSIS — D509 Iron deficiency anemia, unspecified: Secondary | ICD-10-CM | POA: Diagnosis not present

## 2022-09-21 DIAGNOSIS — D631 Anemia in chronic kidney disease: Secondary | ICD-10-CM | POA: Diagnosis not present

## 2022-09-21 DIAGNOSIS — N186 End stage renal disease: Secondary | ICD-10-CM | POA: Diagnosis not present

## 2022-09-23 DIAGNOSIS — N250 Renal osteodystrophy: Secondary | ICD-10-CM | POA: Diagnosis not present

## 2022-09-23 DIAGNOSIS — Z992 Dependence on renal dialysis: Secondary | ICD-10-CM | POA: Diagnosis not present

## 2022-09-23 DIAGNOSIS — D631 Anemia in chronic kidney disease: Secondary | ICD-10-CM | POA: Diagnosis not present

## 2022-09-23 DIAGNOSIS — D509 Iron deficiency anemia, unspecified: Secondary | ICD-10-CM | POA: Diagnosis not present

## 2022-09-23 DIAGNOSIS — N186 End stage renal disease: Secondary | ICD-10-CM | POA: Diagnosis not present

## 2022-09-26 DIAGNOSIS — N250 Renal osteodystrophy: Secondary | ICD-10-CM | POA: Diagnosis not present

## 2022-09-26 DIAGNOSIS — Z992 Dependence on renal dialysis: Secondary | ICD-10-CM | POA: Diagnosis not present

## 2022-09-26 DIAGNOSIS — D631 Anemia in chronic kidney disease: Secondary | ICD-10-CM | POA: Diagnosis not present

## 2022-09-26 DIAGNOSIS — N186 End stage renal disease: Secondary | ICD-10-CM | POA: Diagnosis not present

## 2022-09-26 DIAGNOSIS — D509 Iron deficiency anemia, unspecified: Secondary | ICD-10-CM | POA: Diagnosis not present

## 2022-09-28 DIAGNOSIS — D631 Anemia in chronic kidney disease: Secondary | ICD-10-CM | POA: Diagnosis not present

## 2022-09-28 DIAGNOSIS — D509 Iron deficiency anemia, unspecified: Secondary | ICD-10-CM | POA: Diagnosis not present

## 2022-09-28 DIAGNOSIS — N186 End stage renal disease: Secondary | ICD-10-CM | POA: Diagnosis not present

## 2022-09-28 DIAGNOSIS — N250 Renal osteodystrophy: Secondary | ICD-10-CM | POA: Diagnosis not present

## 2022-09-28 DIAGNOSIS — Z992 Dependence on renal dialysis: Secondary | ICD-10-CM | POA: Diagnosis not present

## 2022-09-30 DIAGNOSIS — N250 Renal osteodystrophy: Secondary | ICD-10-CM | POA: Diagnosis not present

## 2022-09-30 DIAGNOSIS — D509 Iron deficiency anemia, unspecified: Secondary | ICD-10-CM | POA: Diagnosis not present

## 2022-09-30 DIAGNOSIS — Z992 Dependence on renal dialysis: Secondary | ICD-10-CM | POA: Diagnosis not present

## 2022-09-30 DIAGNOSIS — N186 End stage renal disease: Secondary | ICD-10-CM | POA: Diagnosis not present

## 2022-09-30 DIAGNOSIS — D631 Anemia in chronic kidney disease: Secondary | ICD-10-CM | POA: Diagnosis not present

## 2022-10-03 DIAGNOSIS — D509 Iron deficiency anemia, unspecified: Secondary | ICD-10-CM | POA: Diagnosis not present

## 2022-10-03 DIAGNOSIS — D631 Anemia in chronic kidney disease: Secondary | ICD-10-CM | POA: Diagnosis not present

## 2022-10-03 DIAGNOSIS — Z992 Dependence on renal dialysis: Secondary | ICD-10-CM | POA: Diagnosis not present

## 2022-10-03 DIAGNOSIS — N250 Renal osteodystrophy: Secondary | ICD-10-CM | POA: Diagnosis not present

## 2022-10-03 DIAGNOSIS — N186 End stage renal disease: Secondary | ICD-10-CM | POA: Diagnosis not present

## 2022-10-04 ENCOUNTER — Inpatient Hospital Stay: Payer: Medicare Other

## 2022-10-04 VITALS — BP 124/67 | HR 73 | Temp 98.0°F | Resp 16

## 2022-10-04 DIAGNOSIS — C7B02 Secondary carcinoid tumors of liver: Secondary | ICD-10-CM | POA: Diagnosis not present

## 2022-10-04 DIAGNOSIS — C7A00 Malignant carcinoid tumor of unspecified site: Secondary | ICD-10-CM | POA: Diagnosis not present

## 2022-10-04 MED ORDER — LANREOTIDE ACETATE 120 MG/0.5ML ~~LOC~~ SOLN
120.0000 mg | Freq: Once | SUBCUTANEOUS | Status: AC
  Administered 2022-10-04: 120 mg via SUBCUTANEOUS
  Filled 2022-10-04: qty 120

## 2022-10-04 NOTE — Patient Instructions (Signed)
[facility]  Discharge Instructions: Thank you for choosing [facility] to provide your oncology and hematology care.  If you have a lab appointment with the Cancer Center, please come in thru the Main Entrance and check in at the main information desk.  Wear comfortable clothing and clothing appropriate for easy access to any Portacath or PICC line.   We strive to give you quality time with your provider. You may need to reschedule your appointment if you arrive late (15 or more minutes).  Arriving late affects you and other patients whose appointments are after yours.  Also, if you miss three or more appointments without notifying the office, you may be dismissed from the clinic at the provider's discretion.      For prescription refill requests, have your pharmacy contact our office and allow 72 hours for refills to be completed.    Today you received the following chemotherapy and/or immunotherapy agents Lanreotide      To help prevent nausea and vomiting after your treatment, we encourage you to take your nausea medication as directed.  BELOW ARE SYMPTOMS THAT SHOULD BE REPORTED IMMEDIATELY: *FEVER GREATER THAN 100.4 F (38 C) OR HIGHER *CHILLS OR SWEATING *NAUSEA AND VOMITING THAT IS NOT CONTROLLED WITH YOUR NAUSEA MEDICATION *UNUSUAL SHORTNESS OF BREATH *UNUSUAL BRUISING OR BLEEDING *URINARY PROBLEMS (pain or burning when urinating, or frequent urination) *BOWEL PROBLEMS (unusual diarrhea, constipation, pain near the anus) TENDERNESS IN MOUTH AND THROAT WITH OR WITHOUT PRESENCE OF ULCERS (sore throat, sores in mouth, or a toothache) UNUSUAL RASH, SWELLING OR PAIN  UNUSUAL VAGINAL DISCHARGE OR ITCHING   Items with * indicate a potential emergency and should be followed up as soon as possible or go to the Emergency Department if any problems should occur.  Please show the CHEMOTHERAPY ALERT CARD or IMMUNOTHERAPY ALERT CARD at check-in to the  Emergency Department and triage nurse.  Should you have questions after your visit or need to cancel or reschedule your appointment, please contact [facility] [phone]  and follow the prompts.  Office hours are 8:00 a.m. to 4:30 p.m. Monday - Friday. Please note that voicemails left after 4:00 p.m. may not be returned until the following business day.  We are closed weekends and major holidays. You have access to a nurse at all times for urgent questions. Please call the main number to the clinic [phone] and follow the prompts.  For any non-urgent questions, you may also contact your provider using MyChart. We now offer e-Visits for anyone 18 and older to request care online for non-urgent symptoms. For details visit [email or web address].   Also download the MyChart app! Go to the app store, search "MyChart", open the app, select [facility], and log in with your MyChart username and password.   

## 2022-10-04 NOTE — Progress Notes (Signed)
Allen Davenport presents today for Lanreotide injection per the provider's orders.  Stable during administration without incident; injection site WNL; see MAR for injection details.  Patient tolerated procedure well and without incident.  No questions or complaints noted at this time.  

## 2022-10-05 DIAGNOSIS — N250 Renal osteodystrophy: Secondary | ICD-10-CM | POA: Diagnosis not present

## 2022-10-05 DIAGNOSIS — Z992 Dependence on renal dialysis: Secondary | ICD-10-CM | POA: Diagnosis not present

## 2022-10-05 DIAGNOSIS — D509 Iron deficiency anemia, unspecified: Secondary | ICD-10-CM | POA: Diagnosis not present

## 2022-10-05 DIAGNOSIS — N186 End stage renal disease: Secondary | ICD-10-CM | POA: Diagnosis not present

## 2022-10-05 DIAGNOSIS — D631 Anemia in chronic kidney disease: Secondary | ICD-10-CM | POA: Diagnosis not present

## 2022-10-07 DIAGNOSIS — Z992 Dependence on renal dialysis: Secondary | ICD-10-CM | POA: Diagnosis not present

## 2022-10-07 DIAGNOSIS — D509 Iron deficiency anemia, unspecified: Secondary | ICD-10-CM | POA: Diagnosis not present

## 2022-10-07 DIAGNOSIS — N186 End stage renal disease: Secondary | ICD-10-CM | POA: Diagnosis not present

## 2022-10-07 DIAGNOSIS — D631 Anemia in chronic kidney disease: Secondary | ICD-10-CM | POA: Diagnosis not present

## 2022-10-07 DIAGNOSIS — N250 Renal osteodystrophy: Secondary | ICD-10-CM | POA: Diagnosis not present

## 2022-10-10 DIAGNOSIS — D509 Iron deficiency anemia, unspecified: Secondary | ICD-10-CM | POA: Diagnosis not present

## 2022-10-10 DIAGNOSIS — D631 Anemia in chronic kidney disease: Secondary | ICD-10-CM | POA: Diagnosis not present

## 2022-10-10 DIAGNOSIS — N250 Renal osteodystrophy: Secondary | ICD-10-CM | POA: Diagnosis not present

## 2022-10-10 DIAGNOSIS — N186 End stage renal disease: Secondary | ICD-10-CM | POA: Diagnosis not present

## 2022-10-10 DIAGNOSIS — Z992 Dependence on renal dialysis: Secondary | ICD-10-CM | POA: Diagnosis not present

## 2022-10-12 DIAGNOSIS — N250 Renal osteodystrophy: Secondary | ICD-10-CM | POA: Diagnosis not present

## 2022-10-12 DIAGNOSIS — N186 End stage renal disease: Secondary | ICD-10-CM | POA: Diagnosis not present

## 2022-10-12 DIAGNOSIS — Z992 Dependence on renal dialysis: Secondary | ICD-10-CM | POA: Diagnosis not present

## 2022-10-12 DIAGNOSIS — D631 Anemia in chronic kidney disease: Secondary | ICD-10-CM | POA: Diagnosis not present

## 2022-10-12 DIAGNOSIS — D509 Iron deficiency anemia, unspecified: Secondary | ICD-10-CM | POA: Diagnosis not present

## 2022-10-14 DIAGNOSIS — Z992 Dependence on renal dialysis: Secondary | ICD-10-CM | POA: Diagnosis not present

## 2022-10-14 DIAGNOSIS — D509 Iron deficiency anemia, unspecified: Secondary | ICD-10-CM | POA: Diagnosis not present

## 2022-10-14 DIAGNOSIS — N186 End stage renal disease: Secondary | ICD-10-CM | POA: Diagnosis not present

## 2022-10-14 DIAGNOSIS — N250 Renal osteodystrophy: Secondary | ICD-10-CM | POA: Diagnosis not present

## 2022-10-14 DIAGNOSIS — D631 Anemia in chronic kidney disease: Secondary | ICD-10-CM | POA: Diagnosis not present

## 2022-10-15 DIAGNOSIS — N186 End stage renal disease: Secondary | ICD-10-CM | POA: Diagnosis not present

## 2022-10-15 DIAGNOSIS — Z992 Dependence on renal dialysis: Secondary | ICD-10-CM | POA: Diagnosis not present

## 2022-10-17 DIAGNOSIS — D631 Anemia in chronic kidney disease: Secondary | ICD-10-CM | POA: Diagnosis not present

## 2022-10-17 DIAGNOSIS — N186 End stage renal disease: Secondary | ICD-10-CM | POA: Diagnosis not present

## 2022-10-17 DIAGNOSIS — D509 Iron deficiency anemia, unspecified: Secondary | ICD-10-CM | POA: Diagnosis not present

## 2022-10-17 DIAGNOSIS — N250 Renal osteodystrophy: Secondary | ICD-10-CM | POA: Diagnosis not present

## 2022-10-17 DIAGNOSIS — Z992 Dependence on renal dialysis: Secondary | ICD-10-CM | POA: Diagnosis not present

## 2022-10-18 ENCOUNTER — Inpatient Hospital Stay: Payer: Medicare Other | Attending: Hematology

## 2022-10-18 VITALS — BP 121/65 | HR 57 | Temp 97.9°F | Resp 18

## 2022-10-18 DIAGNOSIS — C7A00 Malignant carcinoid tumor of unspecified site: Secondary | ICD-10-CM | POA: Diagnosis not present

## 2022-10-18 DIAGNOSIS — C7B02 Secondary carcinoid tumors of liver: Secondary | ICD-10-CM | POA: Insufficient documentation

## 2022-10-18 MED ORDER — LANREOTIDE ACETATE 120 MG/0.5ML ~~LOC~~ SOLN
120.0000 mg | Freq: Once | SUBCUTANEOUS | Status: AC
  Administered 2022-10-18: 120 mg via SUBCUTANEOUS
  Filled 2022-10-18: qty 120

## 2022-10-18 NOTE — Progress Notes (Signed)
Patient presents today for Lanreotide injection per providers order.  Vital signs WNL.  Patient has no new complaints at this time.  Lanreotide administration without incident; injection site WNL; see MAR for injection details.  Patient tolerated procedure well and without incident.  No questions or complaints noted at this time.

## 2022-10-18 NOTE — Patient Instructions (Signed)
[facility]  Discharge Instructions: Thank you for choosing [facility] to provide your oncology and hematology care.  If you have a lab appointment with the [facility] - please note that after April 8th, 2024, all labs will be drawn in the cancer center.  You do not have to check in or register with the main entrance as you have in the past but will complete your check-in in the cancer center.  Wear comfortable clothing and clothing appropriate for easy access to any Portacath or PICC line.   We strive to give you quality time with your provider. You may need to reschedule your appointment if you arrive late (15 or more minutes).  Arriving late affects you and other patients whose appointments are after yours.  Also, if you miss three or more appointments without notifying the office, you may be dismissed from the clinic at the provider's discretion.      For prescription refill requests, have your pharmacy contact our office and allow 72 hours for refills to be completed.    Today you received the following chemotherapy and/or immunotherapy agents Lanreotide      To help prevent nausea and vomiting after your treatment, we encourage you to take your nausea medication as directed.  BELOW ARE SYMPTOMS THAT SHOULD BE REPORTED IMMEDIATELY: *FEVER GREATER THAN 100.4 F (38 C) OR HIGHER *CHILLS OR SWEATING *NAUSEA AND VOMITING THAT IS NOT CONTROLLED WITH YOUR NAUSEA MEDICATION *UNUSUAL SHORTNESS OF BREATH *UNUSUAL BRUISING OR BLEEDING *URINARY PROBLEMS (pain or burning when urinating, or frequent urination) *BOWEL PROBLEMS (unusual diarrhea, constipation, pain near the anus) TENDERNESS IN MOUTH AND THROAT WITH OR WITHOUT PRESENCE OF ULCERS (sore throat, sores in mouth, or a toothache) UNUSUAL RASH, SWELLING OR PAIN  UNUSUAL VAGINAL DISCHARGE OR ITCHING   Items with * indicate a potential emergency and should be followed up as soon as possible or go to the  Emergency Department if any problems should occur.  Please show the CHEMOTHERAPY ALERT CARD or IMMUNOTHERAPY ALERT CARD at check-in to the Emergency Department and triage nurse.  Should you have questions after your visit or need to cancel or reschedule your appointment, please contact [facility] [phone]  and follow the prompts.  Office hours are 8:00 a.m. to 4:30 p.m. Monday - Friday. Please note that voicemails left after 4:00 p.m. may not be returned until the following business day.  We are closed weekends and major holidays. You have access to a nurse at all times for urgent questions. Please call the main number to the clinic [phone] and follow the prompts.  For any non-urgent questions, you may also contact your provider using MyChart. We now offer e-Visits for anyone 54 and older to request care online for non-urgent symptoms. For details visit [email or web address].   Also download the MyChart app! Go to the app store, search "MyChart", open the app, select [facility], and log in with your MyChart username and password.

## 2022-10-19 DIAGNOSIS — N250 Renal osteodystrophy: Secondary | ICD-10-CM | POA: Diagnosis not present

## 2022-10-19 DIAGNOSIS — D509 Iron deficiency anemia, unspecified: Secondary | ICD-10-CM | POA: Diagnosis not present

## 2022-10-19 DIAGNOSIS — N186 End stage renal disease: Secondary | ICD-10-CM | POA: Diagnosis not present

## 2022-10-19 DIAGNOSIS — D631 Anemia in chronic kidney disease: Secondary | ICD-10-CM | POA: Diagnosis not present

## 2022-10-19 DIAGNOSIS — Z992 Dependence on renal dialysis: Secondary | ICD-10-CM | POA: Diagnosis not present

## 2022-10-21 DIAGNOSIS — N250 Renal osteodystrophy: Secondary | ICD-10-CM | POA: Diagnosis not present

## 2022-10-21 DIAGNOSIS — Z992 Dependence on renal dialysis: Secondary | ICD-10-CM | POA: Diagnosis not present

## 2022-10-21 DIAGNOSIS — N186 End stage renal disease: Secondary | ICD-10-CM | POA: Diagnosis not present

## 2022-10-21 DIAGNOSIS — D509 Iron deficiency anemia, unspecified: Secondary | ICD-10-CM | POA: Diagnosis not present

## 2022-10-21 DIAGNOSIS — D631 Anemia in chronic kidney disease: Secondary | ICD-10-CM | POA: Diagnosis not present

## 2022-10-24 DIAGNOSIS — Z992 Dependence on renal dialysis: Secondary | ICD-10-CM | POA: Diagnosis not present

## 2022-10-24 DIAGNOSIS — D509 Iron deficiency anemia, unspecified: Secondary | ICD-10-CM | POA: Diagnosis not present

## 2022-10-24 DIAGNOSIS — N250 Renal osteodystrophy: Secondary | ICD-10-CM | POA: Diagnosis not present

## 2022-10-24 DIAGNOSIS — D631 Anemia in chronic kidney disease: Secondary | ICD-10-CM | POA: Diagnosis not present

## 2022-10-24 DIAGNOSIS — N186 End stage renal disease: Secondary | ICD-10-CM | POA: Diagnosis not present

## 2022-10-26 DIAGNOSIS — N186 End stage renal disease: Secondary | ICD-10-CM | POA: Diagnosis not present

## 2022-10-26 DIAGNOSIS — N250 Renal osteodystrophy: Secondary | ICD-10-CM | POA: Diagnosis not present

## 2022-10-26 DIAGNOSIS — D631 Anemia in chronic kidney disease: Secondary | ICD-10-CM | POA: Diagnosis not present

## 2022-10-26 DIAGNOSIS — Z992 Dependence on renal dialysis: Secondary | ICD-10-CM | POA: Diagnosis not present

## 2022-10-26 DIAGNOSIS — D509 Iron deficiency anemia, unspecified: Secondary | ICD-10-CM | POA: Diagnosis not present

## 2022-10-28 DIAGNOSIS — D631 Anemia in chronic kidney disease: Secondary | ICD-10-CM | POA: Diagnosis not present

## 2022-10-28 DIAGNOSIS — N186 End stage renal disease: Secondary | ICD-10-CM | POA: Diagnosis not present

## 2022-10-28 DIAGNOSIS — N250 Renal osteodystrophy: Secondary | ICD-10-CM | POA: Diagnosis not present

## 2022-10-28 DIAGNOSIS — D509 Iron deficiency anemia, unspecified: Secondary | ICD-10-CM | POA: Diagnosis not present

## 2022-10-28 DIAGNOSIS — Z992 Dependence on renal dialysis: Secondary | ICD-10-CM | POA: Diagnosis not present

## 2022-10-31 ENCOUNTER — Other Ambulatory Visit: Payer: Self-pay

## 2022-10-31 DIAGNOSIS — N186 End stage renal disease: Secondary | ICD-10-CM | POA: Diagnosis not present

## 2022-10-31 DIAGNOSIS — D509 Iron deficiency anemia, unspecified: Secondary | ICD-10-CM | POA: Diagnosis not present

## 2022-10-31 DIAGNOSIS — Z992 Dependence on renal dialysis: Secondary | ICD-10-CM | POA: Diagnosis not present

## 2022-10-31 DIAGNOSIS — N250 Renal osteodystrophy: Secondary | ICD-10-CM | POA: Diagnosis not present

## 2022-10-31 DIAGNOSIS — D631 Anemia in chronic kidney disease: Secondary | ICD-10-CM | POA: Diagnosis not present

## 2022-10-31 DIAGNOSIS — C7A00 Malignant carcinoid tumor of unspecified site: Secondary | ICD-10-CM

## 2022-11-01 ENCOUNTER — Encounter (HOSPITAL_COMMUNITY): Payer: Self-pay | Admitting: Hematology

## 2022-11-01 ENCOUNTER — Inpatient Hospital Stay: Payer: Medicare Other

## 2022-11-01 VITALS — BP 126/84 | HR 78 | Temp 97.6°F | Resp 18

## 2022-11-01 DIAGNOSIS — C7B02 Secondary carcinoid tumors of liver: Secondary | ICD-10-CM | POA: Diagnosis not present

## 2022-11-01 DIAGNOSIS — C7A00 Malignant carcinoid tumor of unspecified site: Secondary | ICD-10-CM | POA: Diagnosis not present

## 2022-11-01 LAB — CBC WITH DIFFERENTIAL/PLATELET
Abs Immature Granulocytes: 0.01 10*3/uL (ref 0.00–0.07)
Basophils Absolute: 0 10*3/uL (ref 0.0–0.1)
Basophils Relative: 1 %
Eosinophils Absolute: 0.2 10*3/uL (ref 0.0–0.5)
Eosinophils Relative: 6 %
HCT: 34.8 % — ABNORMAL LOW (ref 39.0–52.0)
Hemoglobin: 11.2 g/dL — ABNORMAL LOW (ref 13.0–17.0)
Immature Granulocytes: 0 %
Lymphocytes Relative: 10 %
Lymphs Abs: 0.4 10*3/uL — ABNORMAL LOW (ref 0.7–4.0)
MCH: 32.8 pg (ref 26.0–34.0)
MCHC: 32.2 g/dL (ref 30.0–36.0)
MCV: 102.1 fL — ABNORMAL HIGH (ref 80.0–100.0)
Monocytes Absolute: 0.3 10*3/uL (ref 0.1–1.0)
Monocytes Relative: 8 %
Neutro Abs: 3.1 10*3/uL (ref 1.7–7.7)
Neutrophils Relative %: 75 %
Platelets: 106 10*3/uL — ABNORMAL LOW (ref 150–400)
RBC: 3.41 MIL/uL — ABNORMAL LOW (ref 4.22–5.81)
RDW: 13.6 % (ref 11.5–15.5)
WBC: 4.1 10*3/uL (ref 4.0–10.5)
nRBC: 0 % (ref 0.0–0.2)

## 2022-11-01 LAB — COMPREHENSIVE METABOLIC PANEL
ALT: 13 U/L (ref 0–44)
AST: 21 U/L (ref 15–41)
Albumin: 3.3 g/dL — ABNORMAL LOW (ref 3.5–5.0)
Alkaline Phosphatase: 83 U/L (ref 38–126)
Anion gap: 9 (ref 5–15)
BUN: 24 mg/dL — ABNORMAL HIGH (ref 8–23)
CO2: 29 mmol/L (ref 22–32)
Calcium: 8.4 mg/dL — ABNORMAL LOW (ref 8.9–10.3)
Chloride: 97 mmol/L — ABNORMAL LOW (ref 98–111)
Creatinine, Ser: 4.57 mg/dL — ABNORMAL HIGH (ref 0.61–1.24)
GFR, Estimated: 12 mL/min — ABNORMAL LOW (ref >60)
Glucose, Bld: 151 mg/dL — ABNORMAL HIGH (ref 70–99)
Potassium: 4.2 mmol/L (ref 3.5–5.1)
Sodium: 135 mmol/L (ref 135–145)
Total Bilirubin: 0.9 mg/dL (ref 0.3–1.2)
Total Protein: 5.8 g/dL — ABNORMAL LOW (ref 6.5–8.1)

## 2022-11-01 MED ORDER — LANREOTIDE ACETATE 120 MG/0.5ML ~~LOC~~ SOLN
120.0000 mg | Freq: Once | SUBCUTANEOUS | Status: AC
  Administered 2022-11-01: 120 mg via SUBCUTANEOUS
  Filled 2022-11-01: qty 120

## 2022-11-01 NOTE — Patient Instructions (Signed)
[facility]  Discharge Instructions: Thank you for choosing [facility] to provide your oncology and hematology care.  If you have a lab appointment with the Cancer Center - please note that after April 8th, 2024, all labs will be drawn in the cancer center.  You do not have to check in or register with the main entrance as you have in the past but will complete your check-in in the cancer center.  Wear comfortable clothing and clothing appropriate for easy access to any Portacath or PICC line.   We strive to give you quality time with your provider. You may need to reschedule your appointment if you arrive late (15 or more minutes).  Arriving late affects you and other patients whose appointments are after yours.  Also, if you miss three or more appointments without notifying the office, you may be dismissed from the clinic at the provider's discretion.      For prescription refill requests, have your pharmacy contact our office and allow 72 hours for refills to be completed.    Today you received the following Lanreotide, return as scheduled.   To help prevent nausea and vomiting after your treatment, we encourage you to take your nausea medication as directed.  BELOW ARE SYMPTOMS THAT SHOULD BE REPORTED IMMEDIATELY: *FEVER GREATER THAN 100.4 F (38 C) OR HIGHER *CHILLS OR SWEATING *NAUSEA AND VOMITING THAT IS NOT CONTROLLED WITH YOUR NAUSEA MEDICATION *UNUSUAL SHORTNESS OF BREATH *UNUSUAL BRUISING OR BLEEDING *URINARY PROBLEMS (pain or burning when urinating, or frequent urination) *BOWEL PROBLEMS (unusual diarrhea, constipation, pain near the anus) TENDERNESS IN MOUTH AND THROAT WITH OR WITHOUT PRESENCE OF ULCERS (sore throat, sores in mouth, or a toothache) UNUSUAL RASH, SWELLING OR PAIN  UNUSUAL VAGINAL DISCHARGE OR ITCHING   Items with * indicate a potential emergency and should be followed up as soon as possible or go to the Emergency Department  if any problems should occur.  Please show the CHEMOTHERAPY ALERT CARD or IMMUNOTHERAPY ALERT CARD at check-in to the Emergency Department and triage nurse.  Should you have questions after your visit or need to cancel or reschedule your appointment, please contact [facility] [phone]  and follow the prompts.  Office hours are 8:00 a.m. to 4:30 p.m. Monday - Friday. Please note that voicemails left after 4:00 p.m. may not be returned until the following business day.  We are closed weekends and major holidays. You have access to a nurse at all times for urgent questions. Please call the main number to the clinic [phone] and follow the prompts.  For any non-urgent questions, you may also contact your provider using MyChart. We now offer e-Visits for anyone 18 and older to request care online for non-urgent symptoms. For details visit [email or web address].   Also download the MyChart app! Go to the app store, search "MyChart", open the app, select [facility], and log in with your MyChart username and password.   

## 2022-11-01 NOTE — Progress Notes (Signed)
Patient tolerated Lanreotide injection with no complaints voiced. Site clean and dry with no bruising or swelling noted at site. See MAR for details. Band aid applied.  Patient stable during and after injection. VSS with discharge and left in satisfactory condition with no s/s of distress noted. 

## 2022-11-02 DIAGNOSIS — D509 Iron deficiency anemia, unspecified: Secondary | ICD-10-CM | POA: Diagnosis not present

## 2022-11-02 DIAGNOSIS — N186 End stage renal disease: Secondary | ICD-10-CM | POA: Diagnosis not present

## 2022-11-02 DIAGNOSIS — Z992 Dependence on renal dialysis: Secondary | ICD-10-CM | POA: Diagnosis not present

## 2022-11-02 DIAGNOSIS — D631 Anemia in chronic kidney disease: Secondary | ICD-10-CM | POA: Diagnosis not present

## 2022-11-02 DIAGNOSIS — N250 Renal osteodystrophy: Secondary | ICD-10-CM | POA: Diagnosis not present

## 2022-11-03 LAB — CHROMOGRANIN A: Chromogranin A (ng/mL): 1647 ng/mL — ABNORMAL HIGH (ref 0.0–101.8)

## 2022-11-04 DIAGNOSIS — N186 End stage renal disease: Secondary | ICD-10-CM | POA: Diagnosis not present

## 2022-11-04 DIAGNOSIS — D509 Iron deficiency anemia, unspecified: Secondary | ICD-10-CM | POA: Diagnosis not present

## 2022-11-04 DIAGNOSIS — N250 Renal osteodystrophy: Secondary | ICD-10-CM | POA: Diagnosis not present

## 2022-11-04 DIAGNOSIS — D631 Anemia in chronic kidney disease: Secondary | ICD-10-CM | POA: Diagnosis not present

## 2022-11-04 DIAGNOSIS — Z992 Dependence on renal dialysis: Secondary | ICD-10-CM | POA: Diagnosis not present

## 2022-11-07 DIAGNOSIS — Z992 Dependence on renal dialysis: Secondary | ICD-10-CM | POA: Diagnosis not present

## 2022-11-07 DIAGNOSIS — N250 Renal osteodystrophy: Secondary | ICD-10-CM | POA: Diagnosis not present

## 2022-11-07 DIAGNOSIS — N186 End stage renal disease: Secondary | ICD-10-CM | POA: Diagnosis not present

## 2022-11-07 DIAGNOSIS — D631 Anemia in chronic kidney disease: Secondary | ICD-10-CM | POA: Diagnosis not present

## 2022-11-07 DIAGNOSIS — D509 Iron deficiency anemia, unspecified: Secondary | ICD-10-CM | POA: Diagnosis not present

## 2022-11-09 DIAGNOSIS — N250 Renal osteodystrophy: Secondary | ICD-10-CM | POA: Diagnosis not present

## 2022-11-09 DIAGNOSIS — D509 Iron deficiency anemia, unspecified: Secondary | ICD-10-CM | POA: Diagnosis not present

## 2022-11-09 DIAGNOSIS — D631 Anemia in chronic kidney disease: Secondary | ICD-10-CM | POA: Diagnosis not present

## 2022-11-09 DIAGNOSIS — N186 End stage renal disease: Secondary | ICD-10-CM | POA: Diagnosis not present

## 2022-11-09 DIAGNOSIS — Z992 Dependence on renal dialysis: Secondary | ICD-10-CM | POA: Diagnosis not present

## 2022-11-11 DIAGNOSIS — N250 Renal osteodystrophy: Secondary | ICD-10-CM | POA: Diagnosis not present

## 2022-11-11 DIAGNOSIS — Z992 Dependence on renal dialysis: Secondary | ICD-10-CM | POA: Diagnosis not present

## 2022-11-11 DIAGNOSIS — D631 Anemia in chronic kidney disease: Secondary | ICD-10-CM | POA: Diagnosis not present

## 2022-11-11 DIAGNOSIS — D509 Iron deficiency anemia, unspecified: Secondary | ICD-10-CM | POA: Diagnosis not present

## 2022-11-11 DIAGNOSIS — N186 End stage renal disease: Secondary | ICD-10-CM | POA: Diagnosis not present

## 2022-11-12 ENCOUNTER — Encounter (HOSPITAL_COMMUNITY): Payer: Self-pay | Admitting: Hematology

## 2022-11-13 DIAGNOSIS — E1140 Type 2 diabetes mellitus with diabetic neuropathy, unspecified: Secondary | ICD-10-CM | POA: Diagnosis not present

## 2022-11-13 DIAGNOSIS — E1151 Type 2 diabetes mellitus with diabetic peripheral angiopathy without gangrene: Secondary | ICD-10-CM | POA: Diagnosis not present

## 2022-11-14 DIAGNOSIS — D509 Iron deficiency anemia, unspecified: Secondary | ICD-10-CM | POA: Diagnosis not present

## 2022-11-14 DIAGNOSIS — N250 Renal osteodystrophy: Secondary | ICD-10-CM | POA: Diagnosis not present

## 2022-11-14 DIAGNOSIS — N186 End stage renal disease: Secondary | ICD-10-CM | POA: Diagnosis not present

## 2022-11-14 DIAGNOSIS — Z992 Dependence on renal dialysis: Secondary | ICD-10-CM | POA: Diagnosis not present

## 2022-11-14 DIAGNOSIS — D631 Anemia in chronic kidney disease: Secondary | ICD-10-CM | POA: Diagnosis not present

## 2022-11-14 NOTE — Progress Notes (Signed)
[facility] [address], [place] [address]    Clinic Day:  11/15/2022  Referring physician: Toma Deiters, MD  Patient Care Team: Toma Deiters, MD as PCP - General (Internal Medicine) Doreatha Massed, MD as Medical Oncologist (Medical Oncology)   ASSESSMENT & PLAN:   Assessment: 1.  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  Everolimus 5 mg daily started on 05/17/2018.  Everolimus discontinued on 02/01/2022. - PET scan (01/27/2022): Clear progression of hepatic metastatic tumor with a large central left hepatic lesion.  Multiple additional smaller metastatic lesions are increased in activity.  Stable multifocal peritoneal and mesenteric metastasis with some increase in radiotracer activity.  No new metastatic disease. - Umbilical nodule biopsy (02/10/2022): Metastatic neuroendocrine tumor - NGS test: TMB-low, MSI-stable, no other targetable mutations.  - Dotatate PET scan (06/14/2022): Progression of well-differentiated neuroendocrine tumor within the liver with dominant lesion in the central left hepatic lobe increased by 1 cm.  Other lesions have increased SUV.  Central mesenteric calcified mass is stable.  New peritoneal nodule superior left of the central mesenteric nodule.  New peritoneal nodule in the left ventral peritoneal space.  Lesion of the umbilicus with SUV 20, previously 26.  Implants in the deep peritoneal space stable.  Single focus of uptake in the medial left clavicle with SUV 5.7. - Other options including Temodar and PRRT have not been studied in dialysis patients.  I have reached out to [facility] and they do not have any trials open.  Capecitabine can be given with dose reduction in dialysis patients. - We tried to get him on lanreotide with pegylated interferon.  He could not afford the high co-pay. - Lanreotide every 2 weeks started on 07/12/2022   2.  Normocytic  anemia: -This is from combination of end-stage renal disease and relative iron deficiency and myelosuppression from everolimus.   3.  ESRD on HD: -Started on HD on 05/20/2019    Plan: 1.  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 - Denies any carcinoid syndrome symptoms including diarrhea, flushing or wheezing.  No abdominal pain or cramping reported. - Physical exam: Umbilical nodule stable.  No other palpable masses. - I have reviewed his previous dotatate PET scan which showed the umbilical nodule. - Labs today: LFTs are grossly normal.  Chromogranin a level has increased to 1647 from 1074 and 1197 previously. - Recommend that he continue lanreotide on the current schedule every 2 weeks. - Recommend RTC 6 weeks with repeat dotatate PET scan to evaluate for progression. - If there is progression, we will look into other options which are limited given they are not tested in hemodialysis patients.   2.  Normocytic anemia: - Continue Retacrit with hemodialysis.  Hemoglobin today is 11.2.   3.  ESRD on HD: - Continue HD on Tuesday, Thursday and Saturday.    Orders Placed This Encounter  Procedures  . NM PET DOTATATE SKULL BASE TO MID THIGH    Standing Status:   Future    Standing Expiration Date:   11/15/2023    Order Specific Question:   If indicated for the ordered procedure, I authorize the administration of a radiopharmaceutical per Radiology protocol    Answer:   Yes    Order Specific Question:   Preferred imaging location?    Answer:   [facility]  I,Katie Daubenspeck,acting as a Neurosurgeon for Doreatha Massed, MD.,have documented all relevant documentation on the behalf of Doreatha Massed, MD,as directed by  Doreatha Massed, MD while in the presence of Doreatha Massed, MD.   I, Doreatha Massed MD, have reviewed the above documentation for accuracy and completeness, and I agree with the  above.   Doreatha Massed, MD   5/1/202411:58 AM  CHIEF COMPLAINT:   Diagnosis:  metastatic carcinoid tumor of small intestine    Cancer Staging  No matching staging information was found for the patient.   Prior Therapy: Sandostatin monthly and everolimus discontinued on 02/01/2022   Current Therapy:  Lanreotide every 2 weeks    HISTORY OF PRESENT ILLNESS:   Oncology History   No history exists.     INTERVAL HISTORY:   Allen Davenport is a 82 y.o. male presenting to clinic today for follow up of metastatic carcinoid tumor of small intestine. He was last seen by me on 09/20/22.  Today, he states that he is doing well overall. His appetite level is at 100%. His energy level is at 75%.  PAST MEDICAL HISTORY:   Past Medical History: Past Medical History:  Diagnosis Date  . Anemia   . Cancer [facility])    right renal . Prostate- Radiation treatment  . CHF (congestive heart failure) (HCC)   . Chronic kidney disease    Dialysis T/TH/Sa  . Edema   . Heart murmur    "nothing to worry about"  . Hyperlipidemia   . Hypertension   . Malignant carcinoid tumor (HCC) 01/03/2016  . Pneumonia    as a child    Surgical History: Past Surgical History:  Procedure Laterality Date  . AV FISTULA PLACEMENT Left 10/20/2014   Procedure: Left Arm ARTERIOVENOUS (AV) FISTULA CREATION;  Surgeon: Sherren Kerns, MD;  Location: [facility] OR;  Service: Vascular;  Laterality: Left;  . NEPHRECTOMY Right 2010  . PERIPHERAL VASCULAR BALLOON ANGIOPLASTY  06/20/2019   Procedure: PERIPHERAL VASCULAR BALLOON ANGIOPLASTY;  Surgeon: Sherren Kerns, MD;  Location: [facility] INVASIVE CV LAB;  Service: Cardiovascular;;  . REVISON OF ARTERIOVENOUS FISTULA Left 08/04/2019   Procedure: REVISON OF ARTERIOVENOUS FISTULA WITH SIDE BRANCH LIGATION;  Surgeon: Sherren Kerns, MD;  Location: [facility] OR;  Service: Vascular;  Laterality: Left;    Social History: Social History   Socioeconomic History  . Marital status: Married     Spouse name: Not on file  . Number of children: Not on file  . Years of education: Not on file  . Highest education level: Not on file  Occupational History  . Not on file  Tobacco Use  . Smoking status: Never  . Smokeless tobacco: Never  Vaping Use  . Vaping Use: Never used  Substance and Sexual Activity  . Alcohol use: No    Alcohol/week: 0.0 standard drinks of alcohol  . Drug use: No  . Sexual activity: Not on file  Other Topics Concern  . Not on file  Social History Narrative  . Not on file   Social Determinants of Health   Financial Resource Strain: Low Risk  (05/24/2020)   Overall Financial Resource Strain (CARDIA)   . Difficulty of Paying Living Expenses: Not hard at all  Food Insecurity: No Food Insecurity (05/24/2020)   Hunger Vital Sign   . Worried About Programme researcher, broadcasting/film/video in the Last Year: Never true   . Ran Out of Food in the Last Year: Never true  Transportation Needs: No Transportation Needs (05/24/2020)  PRAPARE - Transportation   . Lack of Transportation (Medical): No   . Lack of Transportation (Non-Medical): No  Physical Activity: Insufficiently Active (05/24/2020)   Exercise Vital Sign   . Days of Exercise per Week: 7 days   . Minutes of Exercise per Session: 10 min  Stress: No Stress Concern Present (05/24/2020)   [employer] of Occupational Health - Occupational Stress Questionnaire   . Feeling of Stress : Not at all  Social Connections: Moderately Integrated (05/24/2020)   Social Connection and Isolation Panel [NHANES]   . Frequency of Communication with Friends and Family: More than three times a week   . Frequency of Social Gatherings with Friends and Family: Once a week   . Attends Religious Services: More than 4 times per year   . Active Member of Clubs or Organizations: No   . Attends Banker Meetings: Never   . Marital Status: Married  Catering manager Violence: Not At Risk (05/24/2020)   Humiliation, Afraid, Rape, and Kick  questionnaire   . Fear of Current or Ex-Partner: No   . Emotionally Abused: No   . Physically Abused: No   . Sexually Abused: No    Family History: Family History  Problem Relation Age of Onset  . Cancer Mother   . Hypertension Father   . Cancer Sister     Current Medications:  Current Outpatient Medications:  .  amLODipine (NORVASC) 10 MG tablet, Take 1 tablet every day by oral route., Disp: , Rfl:  .  atorvastatin (LIPITOR) 80 MG tablet, Take 80 mg by mouth daily at 6 PM. , Disp: , Rfl:  .  Calcium Acetate 667 MG TABS, Take 667-2,001 mg by mouth See admin instructions. Take 2001 mg with each meal and 667 mg with each snack, Disp: , Rfl:  .  Calcium Carb-Cholecalciferol (CALCIUM 500 + D PO), Take 1 tablet by mouth in the morning, at noon, and at bedtime., Disp: , Rfl:  .  carvedilol (COREG) 6.25 MG tablet, Take 6.25 mg by mouth 2 (two) times daily., Disp: , Rfl:  .  fexofenadine (ALLEGRA) 180 MG tablet, Take 180 mg by mouth daily., Disp: , Rfl:  .  furosemide (LASIX) 40 MG tablet, Take 40 mg by mouth every morning., Disp: , Rfl:  .  mirtazapine (REMERON) 7.5 MG tablet, Take 7.5 mg by mouth at bedtime., Disp: , Rfl:  .  Omega-3 Fatty Acids (FISH OIL) 1000 MG CAPS, Take 1,000 mg by mouth daily. , Disp: , Rfl:  .  peginterferon alfa-2a (PEGASYS) 180 MCG/ML injection, Inject 0.75 mLs (135 mcg total) into the skin every 14 (fourteen) days., Disp: 2 mL, Rfl: 1 .  Riboflavin (B2) 100 MG TABS, Take 100 mg by mouth daily., Disp: , Rfl:  .  sodium bicarbonate 650 MG tablet, Take 650 mg by mouth 3 (three) times daily., Disp: , Rfl:  .  sodium bicarbonate 650 MG tablet, Take 1 tablet by mouth 3 (three) times daily., Disp: , Rfl:  .  TUBERCULIN SYR 1CC/27GX1/2" (B-D TB SYRINGE 1CC/27GX1/2") 27G X 1/2" 1 ML MISC, Use to administer pegasys every 14 days, Disp: 25 each, Rfl: 0 No current facility-administered medications for this visit.  Facility-Administered Medications Ordered in Other Visits:   .  lanreotide acetate (SOMATULINE DEPOT) injection 120 mg, 120 mg, Subcutaneous, Once, Doreatha Massed, MD   Allergies: No Known Allergies  REVIEW OF SYSTEMS:   Review of Systems  Constitutional:  Negative for chills, fatigue and fever.  HENT:  Negative for lump/mass, mouth sores, nosebleeds, sore throat and trouble swallowing.   Eyes:  Negative for eye problems.  Respiratory:  Negative for cough and shortness of breath.   Cardiovascular:  Negative for chest pain, leg swelling and palpitations.  Gastrointestinal:  Negative for abdominal pain, constipation, diarrhea, nausea and vomiting.  Genitourinary:  Negative for bladder incontinence, difficulty urinating, dysuria, frequency, hematuria and nocturia.   Musculoskeletal:  Negative for arthralgias, back pain, flank pain, myalgias and neck pain.  Skin:  Negative for itching and rash.  Neurological:  Negative for dizziness, headaches and numbness.  Hematological:  Does not bruise/bleed easily.  Psychiatric/Behavioral:  Negative for depression, sleep disturbance and suicidal ideas. The patient is not nervous/anxious.   All other systems reviewed and are negative.    VITALS:   Blood pressure 108/67, pulse 81, temperature 98.1 F (36.7 C), temperature source Oral, resp. rate 16, weight 160 lb 6.4 oz (72.8 kg), SpO2 100 %.  Wt Readings from Last 3 Encounters:  11/15/22 160 lb 6.4 oz (72.8 kg)  09/20/22 165 lb 9.6 oz (75.1 kg)  07/26/22 167 lb 14.4 oz (76.2 kg)    Body mass index is 19.02 kg/m.  Performance status (ECOG): 1 - Symptomatic but completely ambulatory  PHYSICAL EXAM:   Physical Exam Vitals and nursing note reviewed. Exam conducted with a chaperone present.  Constitutional:      Appearance: Normal appearance.  Cardiovascular:     Rate and Rhythm: Normal rate and regular rhythm.     Pulses: Normal pulses.     Heart sounds: Normal heart sounds.  Pulmonary:     Effort: Pulmonary effort is normal.     Breath  sounds: Normal breath sounds.  Abdominal:     Palpations: Abdomen is soft. There is no hepatomegaly, splenomegaly or mass.     Tenderness: There is no abdominal tenderness.     Comments: Umbilical nodule palpable  Musculoskeletal:     Right lower leg: No edema.     Left lower leg: No edema.  Lymphadenopathy:     Cervical: No cervical adenopathy.     Right cervical: No superficial, deep or posterior cervical adenopathy.    Left cervical: No superficial, deep or posterior cervical adenopathy.     Upper Body:     Right upper body: No supraclavicular or axillary adenopathy.     Left upper body: No supraclavicular or axillary adenopathy.  Neurological:     General: No focal deficit present.     Mental Status: He is alert and oriented to person, place, and time.  Psychiatric:        Mood and Affect: Mood normal.        Behavior: Behavior normal.    LABS:      Latest Ref Rng & Units 11/01/2022   10:16 AM 09/06/2022   10:15 AM 07/26/2022   10:25 AM  CBC  WBC 4.0 - 10.5 K/uL 4.1  3.9  4.6   Hemoglobin 13.0 - 17.0 g/dL 29.5  62.1  30.8   Hematocrit 39.0 - 52.0 % 34.8  35.1  38.0   Platelets 150 - 400 K/uL 106  105  141       Latest Ref Rng & Units 11/01/2022   10:16 AM 09/06/2022   10:15 AM 07/26/2022   10:25 AM  CMP  Glucose 70 - 99 mg/dL 657  846  962   BUN 8 - 23 mg/dL 24  48  46   Creatinine 0.61 - 1.24 mg/dL 9.52  4.55  5.18   Sodium 135 - 145 mmol/L 135  134  134   Potassium 3.5 - 5.1 mmol/L 4.2  3.7  3.3   Chloride 98 - 111 mmol/L 97  96  93   CO2 22 - 32 mmol/L 29  28  29    Calcium 8.9 - 10.3 mg/dL 8.4  7.9  8.5   Total Protein 6.5 - 8.1 g/dL 5.8  5.8  6.1   Total Bilirubin 0.3 - 1.2 mg/dL 0.9  0.9  0.6   Alkaline Phos 38 - 126 U/L 83  79  67   AST 15 - 41 U/L 21  27  20    ALT 0 - 44 U/L 13  14  10       No results found for: "CEA1", "CEA" / No results found for: "CEA1", "CEA" No results found for: "PSA1" No results found for: "VWU981" No results found for:  "CAN125"  No results found for: "TOTALPROTELP", "ALBUMINELP", "A1GS", "A2GS", "BETS", "BETA2SER", "GAMS", "MSPIKE", "SPEI" Lab Results  Component Value Date   TIBC 226 (L) 08/06/2019   TIBC 229 (L) 06/30/2019   TIBC 175 (L) 06/06/2019   FERRITIN 454 (H) 08/06/2019   FERRITIN 466 (H) 06/30/2019   FERRITIN 767 (H) 06/06/2019   IRONPCTSAT 50 (H) 08/06/2019   IRONPCTSAT 34 06/30/2019   IRONPCTSAT 9 (L) 06/06/2019   Lab Results  Component Value Date   LDH 131 06/12/2022   LDH 163 09/12/2021   LDH 153 07/20/2021     STUDIES:   No results found.

## 2022-11-15 ENCOUNTER — Inpatient Hospital Stay: Payer: Medicare Other

## 2022-11-15 ENCOUNTER — Inpatient Hospital Stay: Payer: Medicare Other | Attending: Hematology | Admitting: Hematology

## 2022-11-15 VITALS — BP 108/67 | HR 81 | Temp 98.1°F | Resp 16 | Wt 160.4 lb

## 2022-11-15 DIAGNOSIS — C7A00 Malignant carcinoid tumor of unspecified site: Secondary | ICD-10-CM

## 2022-11-15 DIAGNOSIS — C7B02 Secondary carcinoid tumors of liver: Secondary | ICD-10-CM | POA: Diagnosis not present

## 2022-11-15 MED ORDER — LANREOTIDE ACETATE 120 MG/0.5ML ~~LOC~~ SOLN
120.0000 mg | Freq: Once | SUBCUTANEOUS | Status: AC
  Administered 2022-11-15: 120 mg via SUBCUTANEOUS
  Filled 2022-11-15: qty 120

## 2022-11-15 NOTE — Patient Instructions (Signed)
[facility]  Discharge Instructions: Thank you for choosing [facility] to provide your oncology and hematology care.  If you have a lab appointment with the Cancer Center - please note that after April 8th, 2024, all labs will be drawn in the cancer center.  You do not have to check in or register with the main entrance as you have in the past but will complete your check-in in the cancer center.  Wear comfortable clothing and clothing appropriate for easy access to any Portacath or PICC line.   We strive to give you quality time with your provider. You may need to reschedule your appointment if you arrive late (15 or more minutes).  Arriving late affects you and other patients whose appointments are after yours.  Also, if you miss three or more appointments without notifying the office, you may be dismissed from the clinic at the provider's discretion.      For prescription refill requests, have your pharmacy contact our office and allow 72 hours for refills to be completed.  To help prevent nausea and vomiting after your treatment, we encourage you to take your nausea medication as directed.  BELOW ARE SYMPTOMS THAT SHOULD BE REPORTED IMMEDIATELY: *FEVER GREATER THAN 100.4 F (38 C) OR HIGHER *CHILLS OR SWEATING *NAUSEA AND VOMITING THAT IS NOT CONTROLLED WITH YOUR NAUSEA MEDICATION *UNUSUAL SHORTNESS OF BREATH *UNUSUAL BRUISING OR BLEEDING *URINARY PROBLEMS (pain or burning when urinating, or frequent urination) *BOWEL PROBLEMS (unusual diarrhea, constipation, pain near the anus) TENDERNESS IN MOUTH AND THROAT WITH OR WITHOUT PRESENCE OF ULCERS (sore throat, sores in mouth, or a toothache) UNUSUAL RASH, SWELLING OR PAIN  UNUSUAL VAGINAL DISCHARGE OR ITCHING   Items with * indicate a potential emergency and should be followed up as soon as possible or go to the Emergency Department if any problems should occur.  Please show the CHEMOTHERAPY ALERT CARD or  IMMUNOTHERAPY ALERT CARD at check-in to the Emergency Department and triage nurse.  Should you have questions after your visit or need to cancel or reschedule your appointment, please contact [facility] [phone]  and follow the prompts.  Office hours are 8:00 a.m. to 4:30 p.m. Monday - Friday. Please note that voicemails left after 4:00 p.m. may not be returned until the following business day.  We are closed weekends and major holidays. You have access to a nurse at all times for urgent questions. Please call the main number to the clinic [phone] and follow the prompts.  For any non-urgent questions, you may also contact your provider using MyChart. We now offer e-Visits for anyone 18 and older to request care online for non-urgent symptoms. For details visit [email or web address].   Also download the MyChart app! Go to the app store, search "MyChart", open the app, select [facility], and log in with your MyChart username and password.   

## 2022-11-15 NOTE — Patient Instructions (Signed)
[facility] at [facility] Discharge Instructions   You were seen and examined today by Dr. Ellin Saba.  He reviewed the results of your lab work which are mostly normal/stable. Your chromogranin level has gone up some. It was 1650.   We will continue Lanreotide injections every 2 weeks. We will repeat a PET scan prior to your next visit to see Dr. Kirtland Bouchard.   Return as scheduled.    Thank you for choosing  Cancer Center at [facility] to provide your oncology and hematology care.  To afford each patient quality time with our provider, please arrive at least 15 minutes before your scheduled appointment time.   If you have a lab appointment with the Cancer Center please come in thru the Main Entrance and check in at the main information desk.  You need to re-schedule your appointment should you arrive 10 or more minutes late.  We strive to give you quality time with our providers, and arriving late affects you and other patients whose appointments are after yours.  Also, if you no show three or more times for appointments you may be dismissed from the clinic at the providers discretion.     Again, thank you for choosing [facility].  Our hope is that these requests will decrease the amount of time that you wait before being seen by our physicians.       _____________________________________________________________  Should you have questions after your visit to [facility], please contact our office at [phone] and follow the prompts.  Our office hours are 8:00 a.m. and 4:30 p.m. Monday - Friday.  Please note that voicemails left after 4:00 p.m. may not be returned until the following business day.  We are closed weekends and major holidays.  You do have access to a nurse 24-7, just call the main number to the clinic [phone] and do not press any options, hold on the line and a nurse will answer the phone.    For prescription  refill requests, have your pharmacy contact our office and allow 72 hours.    Due to Covid, you will need to wear a mask upon entering the hospital. If you do not have a mask, a mask will be given to you at the Main Entrance upon arrival. For doctor visits, patients may have 1 support person age 12 or older with them. For treatment visits, patients can not have anyone with them due to social distancing guidelines and our immunocompromised population.

## 2022-11-16 DIAGNOSIS — N186 End stage renal disease: Secondary | ICD-10-CM | POA: Diagnosis not present

## 2022-11-16 DIAGNOSIS — Z992 Dependence on renal dialysis: Secondary | ICD-10-CM | POA: Diagnosis not present

## 2022-11-16 DIAGNOSIS — D509 Iron deficiency anemia, unspecified: Secondary | ICD-10-CM | POA: Diagnosis not present

## 2022-11-16 DIAGNOSIS — D631 Anemia in chronic kidney disease: Secondary | ICD-10-CM | POA: Diagnosis not present

## 2022-11-16 DIAGNOSIS — N250 Renal osteodystrophy: Secondary | ICD-10-CM | POA: Diagnosis not present

## 2022-11-18 DIAGNOSIS — D631 Anemia in chronic kidney disease: Secondary | ICD-10-CM | POA: Diagnosis not present

## 2022-11-18 DIAGNOSIS — N186 End stage renal disease: Secondary | ICD-10-CM | POA: Diagnosis not present

## 2022-11-18 DIAGNOSIS — N250 Renal osteodystrophy: Secondary | ICD-10-CM | POA: Diagnosis not present

## 2022-11-18 DIAGNOSIS — Z992 Dependence on renal dialysis: Secondary | ICD-10-CM | POA: Diagnosis not present

## 2022-11-18 DIAGNOSIS — D509 Iron deficiency anemia, unspecified: Secondary | ICD-10-CM | POA: Diagnosis not present

## 2022-11-21 DIAGNOSIS — N186 End stage renal disease: Secondary | ICD-10-CM | POA: Diagnosis not present

## 2022-11-21 DIAGNOSIS — Z992 Dependence on renal dialysis: Secondary | ICD-10-CM | POA: Diagnosis not present

## 2022-11-21 DIAGNOSIS — N250 Renal osteodystrophy: Secondary | ICD-10-CM | POA: Diagnosis not present

## 2022-11-21 DIAGNOSIS — D631 Anemia in chronic kidney disease: Secondary | ICD-10-CM | POA: Diagnosis not present

## 2022-11-21 DIAGNOSIS — D509 Iron deficiency anemia, unspecified: Secondary | ICD-10-CM | POA: Diagnosis not present

## 2022-11-23 DIAGNOSIS — N186 End stage renal disease: Secondary | ICD-10-CM | POA: Diagnosis not present

## 2022-11-23 DIAGNOSIS — N250 Renal osteodystrophy: Secondary | ICD-10-CM | POA: Diagnosis not present

## 2022-11-23 DIAGNOSIS — D509 Iron deficiency anemia, unspecified: Secondary | ICD-10-CM | POA: Diagnosis not present

## 2022-11-23 DIAGNOSIS — D631 Anemia in chronic kidney disease: Secondary | ICD-10-CM | POA: Diagnosis not present

## 2022-11-23 DIAGNOSIS — Z992 Dependence on renal dialysis: Secondary | ICD-10-CM | POA: Diagnosis not present

## 2022-11-25 DIAGNOSIS — D631 Anemia in chronic kidney disease: Secondary | ICD-10-CM | POA: Diagnosis not present

## 2022-11-25 DIAGNOSIS — Z992 Dependence on renal dialysis: Secondary | ICD-10-CM | POA: Diagnosis not present

## 2022-11-25 DIAGNOSIS — N250 Renal osteodystrophy: Secondary | ICD-10-CM | POA: Diagnosis not present

## 2022-11-25 DIAGNOSIS — D509 Iron deficiency anemia, unspecified: Secondary | ICD-10-CM | POA: Diagnosis not present

## 2022-11-25 DIAGNOSIS — N186 End stage renal disease: Secondary | ICD-10-CM | POA: Diagnosis not present

## 2022-11-28 DIAGNOSIS — N250 Renal osteodystrophy: Secondary | ICD-10-CM | POA: Diagnosis not present

## 2022-11-28 DIAGNOSIS — Z992 Dependence on renal dialysis: Secondary | ICD-10-CM | POA: Diagnosis not present

## 2022-11-28 DIAGNOSIS — N186 End stage renal disease: Secondary | ICD-10-CM | POA: Diagnosis not present

## 2022-11-28 DIAGNOSIS — D631 Anemia in chronic kidney disease: Secondary | ICD-10-CM | POA: Diagnosis not present

## 2022-11-28 DIAGNOSIS — D509 Iron deficiency anemia, unspecified: Secondary | ICD-10-CM | POA: Diagnosis not present

## 2022-11-29 ENCOUNTER — Inpatient Hospital Stay: Payer: Medicare Other

## 2022-11-29 VITALS — BP 106/59 | HR 68 | Temp 97.3°F | Resp 18

## 2022-11-29 DIAGNOSIS — C7B02 Secondary carcinoid tumors of liver: Secondary | ICD-10-CM | POA: Diagnosis not present

## 2022-11-29 DIAGNOSIS — C7A00 Malignant carcinoid tumor of unspecified site: Secondary | ICD-10-CM | POA: Diagnosis not present

## 2022-11-29 MED ORDER — LANREOTIDE ACETATE 120 MG/0.5ML ~~LOC~~ SOLN
120.0000 mg | Freq: Once | SUBCUTANEOUS | Status: AC
  Administered 2022-11-29: 120 mg via SUBCUTANEOUS
  Filled 2022-11-29: qty 120

## 2022-11-29 NOTE — Progress Notes (Signed)
Sandostatin injection given per orders. Patient tolerated it well without problems. Vitals stable and discharged home from clinic ambulatory. Follow up as scheduled.  

## 2022-11-29 NOTE — Patient Instructions (Signed)
[facility]  Discharge Instructions: Thank you for choosing [facility] to provide your oncology and hematology care.  If you have a lab appointment with the Cancer Center - please note that after April 8th, 2024, all labs will be drawn in the cancer center.  You do not have to check in or register with the main entrance as you have in the past but will complete your check-in in the cancer center.  Wear comfortable clothing and clothing appropriate for easy access to any Portacath or PICC line.   We strive to give you quality time with your provider. You may need to reschedule your appointment if you arrive late (15 or more minutes).  Arriving late affects you and other patients whose appointments are after yours.  Also, if you miss three or more appointments without notifying the office, you may be dismissed from the clinic at the provider's discretion.      For prescription refill requests, have your pharmacy contact our office and allow 72 hours for refills to be completed.    Today you received the following , sandostatin injection   To help prevent nausea and vomiting after your treatment, we encourage you to take your nausea medication as directed.  BELOW ARE SYMPTOMS THAT SHOULD BE REPORTED IMMEDIATELY: *FEVER GREATER THAN 100.4 F (38 C) OR HIGHER *CHILLS OR SWEATING *NAUSEA AND VOMITING THAT IS NOT CONTROLLED WITH YOUR NAUSEA MEDICATION *UNUSUAL SHORTNESS OF BREATH *UNUSUAL BRUISING OR BLEEDING *URINARY PROBLEMS (pain or burning when urinating, or frequent urination) *BOWEL PROBLEMS (unusual diarrhea, constipation, pain near the anus) TENDERNESS IN MOUTH AND THROAT WITH OR WITHOUT PRESENCE OF ULCERS (sore throat, sores in mouth, or a toothache) UNUSUAL RASH, SWELLING OR PAIN  UNUSUAL VAGINAL DISCHARGE OR ITCHING   Items with * indicate a potential emergency and should be followed up as soon as possible or go to the Emergency Department if any  problems should occur.  Please show the CHEMOTHERAPY ALERT CARD or IMMUNOTHERAPY ALERT CARD at check-in to the Emergency Department and triage nurse.  Should you have questions after your visit or need to cancel or reschedule your appointment, please contact [facility] [phone]  and follow the prompts.  Office hours are 8:00 a.m. to 4:30 p.m. Monday - Friday. Please note that voicemails left after 4:00 p.m. may not be returned until the following business day.  We are closed weekends and major holidays. You have access to a nurse at all times for urgent questions. Please call the main number to the clinic [phone] and follow the prompts.  For any non-urgent questions, you may also contact your provider using MyChart. We now offer e-Visits for anyone 56 and older to request care online for non-urgent symptoms. For details visit [email or web address].   Also download the MyChart app! Go to the app store, search "MyChart", open the app, select [facility], and log in with your MyChart username and password.

## 2022-11-30 DIAGNOSIS — N250 Renal osteodystrophy: Secondary | ICD-10-CM | POA: Diagnosis not present

## 2022-11-30 DIAGNOSIS — I10 Essential (primary) hypertension: Secondary | ICD-10-CM | POA: Diagnosis not present

## 2022-11-30 DIAGNOSIS — E7849 Other hyperlipidemia: Secondary | ICD-10-CM | POA: Diagnosis not present

## 2022-11-30 DIAGNOSIS — M818 Other osteoporosis without current pathological fracture: Secondary | ICD-10-CM | POA: Diagnosis not present

## 2022-11-30 DIAGNOSIS — D509 Iron deficiency anemia, unspecified: Secondary | ICD-10-CM | POA: Diagnosis not present

## 2022-11-30 DIAGNOSIS — J302 Other seasonal allergic rhinitis: Secondary | ICD-10-CM | POA: Diagnosis not present

## 2022-11-30 DIAGNOSIS — Z992 Dependence on renal dialysis: Secondary | ICD-10-CM | POA: Diagnosis not present

## 2022-11-30 DIAGNOSIS — D631 Anemia in chronic kidney disease: Secondary | ICD-10-CM | POA: Diagnosis not present

## 2022-11-30 DIAGNOSIS — N186 End stage renal disease: Secondary | ICD-10-CM | POA: Diagnosis not present

## 2022-12-01 DIAGNOSIS — I10 Essential (primary) hypertension: Secondary | ICD-10-CM | POA: Diagnosis not present

## 2022-12-01 DIAGNOSIS — N185 Chronic kidney disease, stage 5: Secondary | ICD-10-CM | POA: Diagnosis not present

## 2022-12-01 DIAGNOSIS — M818 Other osteoporosis without current pathological fracture: Secondary | ICD-10-CM | POA: Diagnosis not present

## 2022-12-01 DIAGNOSIS — E7849 Other hyperlipidemia: Secondary | ICD-10-CM | POA: Diagnosis not present

## 2022-12-01 DIAGNOSIS — J302 Other seasonal allergic rhinitis: Secondary | ICD-10-CM | POA: Diagnosis not present

## 2022-12-01 DIAGNOSIS — N186 End stage renal disease: Secondary | ICD-10-CM | POA: Diagnosis not present

## 2022-12-02 DIAGNOSIS — N250 Renal osteodystrophy: Secondary | ICD-10-CM | POA: Diagnosis not present

## 2022-12-02 DIAGNOSIS — N186 End stage renal disease: Secondary | ICD-10-CM | POA: Diagnosis not present

## 2022-12-02 DIAGNOSIS — Z992 Dependence on renal dialysis: Secondary | ICD-10-CM | POA: Diagnosis not present

## 2022-12-02 DIAGNOSIS — D631 Anemia in chronic kidney disease: Secondary | ICD-10-CM | POA: Diagnosis not present

## 2022-12-02 DIAGNOSIS — D509 Iron deficiency anemia, unspecified: Secondary | ICD-10-CM | POA: Diagnosis not present

## 2022-12-05 DIAGNOSIS — N250 Renal osteodystrophy: Secondary | ICD-10-CM | POA: Diagnosis not present

## 2022-12-05 DIAGNOSIS — D509 Iron deficiency anemia, unspecified: Secondary | ICD-10-CM | POA: Diagnosis not present

## 2022-12-05 DIAGNOSIS — D631 Anemia in chronic kidney disease: Secondary | ICD-10-CM | POA: Diagnosis not present

## 2022-12-05 DIAGNOSIS — N186 End stage renal disease: Secondary | ICD-10-CM | POA: Diagnosis not present

## 2022-12-05 DIAGNOSIS — Z992 Dependence on renal dialysis: Secondary | ICD-10-CM | POA: Diagnosis not present

## 2022-12-07 DIAGNOSIS — D631 Anemia in chronic kidney disease: Secondary | ICD-10-CM | POA: Diagnosis not present

## 2022-12-07 DIAGNOSIS — N186 End stage renal disease: Secondary | ICD-10-CM | POA: Diagnosis not present

## 2022-12-07 DIAGNOSIS — D509 Iron deficiency anemia, unspecified: Secondary | ICD-10-CM | POA: Diagnosis not present

## 2022-12-07 DIAGNOSIS — N250 Renal osteodystrophy: Secondary | ICD-10-CM | POA: Diagnosis not present

## 2022-12-07 DIAGNOSIS — Z992 Dependence on renal dialysis: Secondary | ICD-10-CM | POA: Diagnosis not present

## 2022-12-09 DIAGNOSIS — D509 Iron deficiency anemia, unspecified: Secondary | ICD-10-CM | POA: Diagnosis not present

## 2022-12-09 DIAGNOSIS — D631 Anemia in chronic kidney disease: Secondary | ICD-10-CM | POA: Diagnosis not present

## 2022-12-09 DIAGNOSIS — Z992 Dependence on renal dialysis: Secondary | ICD-10-CM | POA: Diagnosis not present

## 2022-12-09 DIAGNOSIS — N250 Renal osteodystrophy: Secondary | ICD-10-CM | POA: Diagnosis not present

## 2022-12-09 DIAGNOSIS — N186 End stage renal disease: Secondary | ICD-10-CM | POA: Diagnosis not present

## 2022-12-12 DIAGNOSIS — D509 Iron deficiency anemia, unspecified: Secondary | ICD-10-CM | POA: Diagnosis not present

## 2022-12-12 DIAGNOSIS — N250 Renal osteodystrophy: Secondary | ICD-10-CM | POA: Diagnosis not present

## 2022-12-12 DIAGNOSIS — Z992 Dependence on renal dialysis: Secondary | ICD-10-CM | POA: Diagnosis not present

## 2022-12-12 DIAGNOSIS — D631 Anemia in chronic kidney disease: Secondary | ICD-10-CM | POA: Diagnosis not present

## 2022-12-12 DIAGNOSIS — N186 End stage renal disease: Secondary | ICD-10-CM | POA: Diagnosis not present

## 2022-12-14 DIAGNOSIS — Z992 Dependence on renal dialysis: Secondary | ICD-10-CM | POA: Diagnosis not present

## 2022-12-14 DIAGNOSIS — D631 Anemia in chronic kidney disease: Secondary | ICD-10-CM | POA: Diagnosis not present

## 2022-12-14 DIAGNOSIS — N186 End stage renal disease: Secondary | ICD-10-CM | POA: Diagnosis not present

## 2022-12-14 DIAGNOSIS — D509 Iron deficiency anemia, unspecified: Secondary | ICD-10-CM | POA: Diagnosis not present

## 2022-12-14 DIAGNOSIS — N250 Renal osteodystrophy: Secondary | ICD-10-CM | POA: Diagnosis not present

## 2022-12-15 DIAGNOSIS — Z992 Dependence on renal dialysis: Secondary | ICD-10-CM | POA: Diagnosis not present

## 2022-12-15 DIAGNOSIS — N186 End stage renal disease: Secondary | ICD-10-CM | POA: Diagnosis not present

## 2022-12-16 DIAGNOSIS — N250 Renal osteodystrophy: Secondary | ICD-10-CM | POA: Diagnosis not present

## 2022-12-16 DIAGNOSIS — D631 Anemia in chronic kidney disease: Secondary | ICD-10-CM | POA: Diagnosis not present

## 2022-12-16 DIAGNOSIS — Z992 Dependence on renal dialysis: Secondary | ICD-10-CM | POA: Diagnosis not present

## 2022-12-16 DIAGNOSIS — N186 End stage renal disease: Secondary | ICD-10-CM | POA: Diagnosis not present

## 2022-12-16 DIAGNOSIS — D509 Iron deficiency anemia, unspecified: Secondary | ICD-10-CM | POA: Diagnosis not present

## 2022-12-19 DIAGNOSIS — Z992 Dependence on renal dialysis: Secondary | ICD-10-CM | POA: Diagnosis not present

## 2022-12-19 DIAGNOSIS — D631 Anemia in chronic kidney disease: Secondary | ICD-10-CM | POA: Diagnosis not present

## 2022-12-19 DIAGNOSIS — D509 Iron deficiency anemia, unspecified: Secondary | ICD-10-CM | POA: Diagnosis not present

## 2022-12-19 DIAGNOSIS — N250 Renal osteodystrophy: Secondary | ICD-10-CM | POA: Diagnosis not present

## 2022-12-19 DIAGNOSIS — N186 End stage renal disease: Secondary | ICD-10-CM | POA: Diagnosis not present

## 2022-12-21 DIAGNOSIS — N186 End stage renal disease: Secondary | ICD-10-CM | POA: Diagnosis not present

## 2022-12-21 DIAGNOSIS — D509 Iron deficiency anemia, unspecified: Secondary | ICD-10-CM | POA: Diagnosis not present

## 2022-12-21 DIAGNOSIS — Z992 Dependence on renal dialysis: Secondary | ICD-10-CM | POA: Diagnosis not present

## 2022-12-21 DIAGNOSIS — N250 Renal osteodystrophy: Secondary | ICD-10-CM | POA: Diagnosis not present

## 2022-12-21 DIAGNOSIS — D631 Anemia in chronic kidney disease: Secondary | ICD-10-CM | POA: Diagnosis not present

## 2022-12-23 DIAGNOSIS — N250 Renal osteodystrophy: Secondary | ICD-10-CM | POA: Diagnosis not present

## 2022-12-23 DIAGNOSIS — Z992 Dependence on renal dialysis: Secondary | ICD-10-CM | POA: Diagnosis not present

## 2022-12-23 DIAGNOSIS — D631 Anemia in chronic kidney disease: Secondary | ICD-10-CM | POA: Diagnosis not present

## 2022-12-23 DIAGNOSIS — N186 End stage renal disease: Secondary | ICD-10-CM | POA: Diagnosis not present

## 2022-12-23 DIAGNOSIS — D509 Iron deficiency anemia, unspecified: Secondary | ICD-10-CM | POA: Diagnosis not present

## 2022-12-25 ENCOUNTER — Inpatient Hospital Stay: Payer: Medicare Other | Attending: Hematology

## 2022-12-25 DIAGNOSIS — C7A00 Malignant carcinoid tumor of unspecified site: Secondary | ICD-10-CM | POA: Diagnosis not present

## 2022-12-25 DIAGNOSIS — N186 End stage renal disease: Secondary | ICD-10-CM | POA: Diagnosis not present

## 2022-12-25 DIAGNOSIS — I132 Hypertensive heart and chronic kidney disease with heart failure and with stage 5 chronic kidney disease, or end stage renal disease: Secondary | ICD-10-CM | POA: Insufficient documentation

## 2022-12-25 DIAGNOSIS — Z905 Acquired absence of kidney: Secondary | ICD-10-CM | POA: Insufficient documentation

## 2022-12-25 DIAGNOSIS — D649 Anemia, unspecified: Secondary | ICD-10-CM | POA: Insufficient documentation

## 2022-12-25 DIAGNOSIS — Z992 Dependence on renal dialysis: Secondary | ICD-10-CM | POA: Diagnosis not present

## 2022-12-25 DIAGNOSIS — Z79899 Other long term (current) drug therapy: Secondary | ICD-10-CM | POA: Diagnosis not present

## 2022-12-25 DIAGNOSIS — C7B02 Secondary carcinoid tumors of liver: Secondary | ICD-10-CM | POA: Diagnosis not present

## 2022-12-25 LAB — CBC WITH DIFFERENTIAL/PLATELET
Abs Immature Granulocytes: 0.01 10*3/uL (ref 0.00–0.07)
Basophils Absolute: 0.1 10*3/uL (ref 0.0–0.1)
Basophils Relative: 1 %
Eosinophils Absolute: 0.6 10*3/uL — ABNORMAL HIGH (ref 0.0–0.5)
Eosinophils Relative: 10 %
HCT: 33.9 % — ABNORMAL LOW (ref 39.0–52.0)
Hemoglobin: 10.8 g/dL — ABNORMAL LOW (ref 13.0–17.0)
Immature Granulocytes: 0 %
Lymphocytes Relative: 8 %
Lymphs Abs: 0.4 10*3/uL — ABNORMAL LOW (ref 0.7–4.0)
MCH: 32.1 pg (ref 26.0–34.0)
MCHC: 31.9 g/dL (ref 30.0–36.0)
MCV: 100.9 fL — ABNORMAL HIGH (ref 80.0–100.0)
Monocytes Absolute: 0.6 10*3/uL (ref 0.1–1.0)
Monocytes Relative: 10 %
Neutro Abs: 4 10*3/uL (ref 1.7–7.7)
Neutrophils Relative %: 71 %
Platelets: 121 10*3/uL — ABNORMAL LOW (ref 150–400)
RBC: 3.36 MIL/uL — ABNORMAL LOW (ref 4.22–5.81)
RDW: 13 % (ref 11.5–15.5)
WBC: 5.6 10*3/uL (ref 4.0–10.5)
nRBC: 0 % (ref 0.0–0.2)

## 2022-12-25 LAB — COMPREHENSIVE METABOLIC PANEL
ALT: 11 U/L (ref 0–44)
AST: 20 U/L (ref 15–41)
Albumin: 3.4 g/dL — ABNORMAL LOW (ref 3.5–5.0)
Alkaline Phosphatase: 78 U/L (ref 38–126)
Anion gap: 8 (ref 5–15)
BUN: 32 mg/dL — ABNORMAL HIGH (ref 8–23)
CO2: 32 mmol/L (ref 22–32)
Calcium: 8.7 mg/dL — ABNORMAL LOW (ref 8.9–10.3)
Chloride: 96 mmol/L — ABNORMAL LOW (ref 98–111)
Creatinine, Ser: 5.95 mg/dL — ABNORMAL HIGH (ref 0.61–1.24)
GFR, Estimated: 9 mL/min — ABNORMAL LOW (ref >60)
Glucose, Bld: 82 mg/dL (ref 70–99)
Potassium: 3.9 mmol/L (ref 3.5–5.1)
Sodium: 136 mmol/L (ref 135–145)
Total Bilirubin: 0.9 mg/dL (ref 0.3–1.2)
Total Protein: 5.8 g/dL — ABNORMAL LOW (ref 6.5–8.1)

## 2022-12-26 DIAGNOSIS — D509 Iron deficiency anemia, unspecified: Secondary | ICD-10-CM | POA: Diagnosis not present

## 2022-12-26 DIAGNOSIS — Z992 Dependence on renal dialysis: Secondary | ICD-10-CM | POA: Diagnosis not present

## 2022-12-26 DIAGNOSIS — N186 End stage renal disease: Secondary | ICD-10-CM | POA: Diagnosis not present

## 2022-12-26 DIAGNOSIS — N250 Renal osteodystrophy: Secondary | ICD-10-CM | POA: Diagnosis not present

## 2022-12-26 DIAGNOSIS — D631 Anemia in chronic kidney disease: Secondary | ICD-10-CM | POA: Diagnosis not present

## 2022-12-27 ENCOUNTER — Ambulatory Visit (HOSPITAL_COMMUNITY)
Admission: RE | Admit: 2022-12-27 | Discharge: 2022-12-27 | Disposition: A | Discharge status: Home / Self Care | Payer: Medicare Other | Source: Ambulatory Visit | Attending: Hematology | Admitting: Hematology

## 2022-12-27 DIAGNOSIS — C787 Secondary malignant neoplasm of liver and intrahepatic bile duct: Secondary | ICD-10-CM | POA: Diagnosis not present

## 2022-12-27 DIAGNOSIS — K769 Liver disease, unspecified: Secondary | ICD-10-CM | POA: Diagnosis not present

## 2022-12-27 DIAGNOSIS — M898X8 Other specified disorders of bone, other site: Secondary | ICD-10-CM | POA: Diagnosis not present

## 2022-12-27 DIAGNOSIS — C7B8 Other secondary neuroendocrine tumors: Secondary | ICD-10-CM | POA: Diagnosis not present

## 2022-12-27 DIAGNOSIS — K668 Other specified disorders of peritoneum: Secondary | ICD-10-CM | POA: Insufficient documentation

## 2022-12-27 DIAGNOSIS — C786 Secondary malignant neoplasm of retroperitoneum and peritoneum: Secondary | ICD-10-CM | POA: Diagnosis not present

## 2022-12-27 DIAGNOSIS — C7A00 Malignant carcinoid tumor of unspecified site: Secondary | ICD-10-CM | POA: Insufficient documentation

## 2022-12-27 DIAGNOSIS — R9389 Abnormal findings on diagnostic imaging of other specified body structures: Secondary | ICD-10-CM | POA: Diagnosis not present

## 2022-12-27 DIAGNOSIS — C7A8 Other malignant neuroendocrine tumors: Secondary | ICD-10-CM | POA: Diagnosis not present

## 2022-12-27 DIAGNOSIS — K439 Ventral hernia without obstruction or gangrene: Secondary | ICD-10-CM | POA: Insufficient documentation

## 2022-12-27 MED ORDER — COPPER CU 64 DOTATATE 1 MCI/ML IV SOLN
4.0000 | Freq: Once | INTRAVENOUS | Status: AC
  Administered 2022-12-27: 3.9 via INTRAVENOUS

## 2022-12-28 DIAGNOSIS — N250 Renal osteodystrophy: Secondary | ICD-10-CM | POA: Diagnosis not present

## 2022-12-28 DIAGNOSIS — D631 Anemia in chronic kidney disease: Secondary | ICD-10-CM | POA: Diagnosis not present

## 2022-12-28 DIAGNOSIS — D509 Iron deficiency anemia, unspecified: Secondary | ICD-10-CM | POA: Diagnosis not present

## 2022-12-28 DIAGNOSIS — Z992 Dependence on renal dialysis: Secondary | ICD-10-CM | POA: Diagnosis not present

## 2022-12-28 DIAGNOSIS — N186 End stage renal disease: Secondary | ICD-10-CM | POA: Diagnosis not present

## 2022-12-28 LAB — CHROMOGRANIN A: Chromogranin A (ng/mL): 1766 ng/mL — ABNORMAL HIGH (ref 0.0–101.8)

## 2022-12-30 DIAGNOSIS — D631 Anemia in chronic kidney disease: Secondary | ICD-10-CM | POA: Diagnosis not present

## 2022-12-30 DIAGNOSIS — D509 Iron deficiency anemia, unspecified: Secondary | ICD-10-CM | POA: Diagnosis not present

## 2022-12-30 DIAGNOSIS — N186 End stage renal disease: Secondary | ICD-10-CM | POA: Diagnosis not present

## 2022-12-30 DIAGNOSIS — N250 Renal osteodystrophy: Secondary | ICD-10-CM | POA: Diagnosis not present

## 2022-12-30 DIAGNOSIS — Z992 Dependence on renal dialysis: Secondary | ICD-10-CM | POA: Diagnosis not present

## 2023-01-02 DIAGNOSIS — N186 End stage renal disease: Secondary | ICD-10-CM | POA: Diagnosis not present

## 2023-01-02 DIAGNOSIS — D509 Iron deficiency anemia, unspecified: Secondary | ICD-10-CM | POA: Diagnosis not present

## 2023-01-02 DIAGNOSIS — Z992 Dependence on renal dialysis: Secondary | ICD-10-CM | POA: Diagnosis not present

## 2023-01-02 DIAGNOSIS — N250 Renal osteodystrophy: Secondary | ICD-10-CM | POA: Diagnosis not present

## 2023-01-02 DIAGNOSIS — D631 Anemia in chronic kidney disease: Secondary | ICD-10-CM | POA: Diagnosis not present

## 2023-01-02 NOTE — Progress Notes (Signed)
[facility] [address], [place] [address]    Clinic Day:  01/03/2023  Referring physician: Toma Deiters, MD  Patient Care Team: Toma Deiters, MD as PCP - General (Internal Medicine) Doreatha Massed, MD as Medical Oncologist (Medical Oncology)   ASSESSMENT & PLAN:   Assessment: 1.  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  Liver biopsy-well-differentiated neuroendocrine tumor. -Sandostatin started back about 5 years ago.  Everolimus 5 mg daily started on 05/17/2018.  Everolimus discontinued on 02/01/2022. - PET scan (01/27/2022): Clear progression of hepatic metastatic tumor with a large central left hepatic lesion.  Multiple additional smaller metastatic lesions are increased in activity.  Stable multifocal peritoneal and mesenteric metastasis with some increase in radiotracer activity.  No new metastatic disease. - Umbilical nodule biopsy (02/10/2022): Metastatic neuroendocrine tumor - NGS test: TMB-low, MSI-stable, no other targetable mutations.  - Dotatate PET scan (06/14/2022): Progression of well-differentiated neuroendocrine tumor within the liver with dominant lesion in the central left hepatic lobe increased by 1 cm.  Other lesions have increased SUV.  Central mesenteric calcified mass is stable.  New peritoneal nodule superior left of the central mesenteric nodule.  New peritoneal nodule in the left ventral peritoneal space.  Lesion of the umbilicus with SUV 20, previously 26.  Implants in the deep peritoneal space stable.  Single focus of uptake in the medial left clavicle with SUV 5.7. - Other options including Temodar and PRRT have not been studied in dialysis patients.  I have reached out to [facility] and they do not have any trials open.  Capecitabine can be given with dose reduction in dialysis patients. - We tried to get him on lanreotide  with pegylated interferon.  He could not afford the high co-pay. - Lanreotide every 2 weeks started on 07/12/2022, discontinued on 01/03/2023 due to progression.   2.  Normocytic anemia: -This is from combination of end-stage renal disease and relative iron deficiency and myelosuppression from everolimus.   3.  ESRD on HD: -Started on HD on 05/20/2019    Plan: 1.  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 - PET scan on 12/27/2022 reviewed by me showed central left hepatic lobe lesion measuring 6.6 cm compared to 4 cm.  Multifocal peritoneal metastasis.  Mesenteric nodule measures 4.9 x 2.2 cm compared to 4.4 x 2.5 cm. - Chromogranin has increased to 1766. - We have discussed other options including chemotherapy with FOLFOX based regimen.  We discussed side effects in detail.  We also discussed best supportive care in the form of hospice. - He does not want any active treatment at this time.  We will see him back in 4 months for follow-up.   2.  Normocytic anemia: - Continue Retacrit with hemodialysis.  Hemoglobin is 10.8.   3.  ESRD on HD: - Continue HD on Tuesday, Thursday and Saturday.    Orders Placed This Encounter  Procedures   CBC with Differential    Standing Status:   Future    Standing Expiration Date:   01/03/2024   Comprehensive metabolic panel    Standing Status:   Future    Standing Expiration Date:   01/03/2024   Chromogranin A    Standing Status:   Future    Standing Expiration Date:   01/03/2024      I,Katie Daubenspeck,acting as a scribe for Doreatha Massed, MD.,have  documented all relevant documentation on the behalf of Doreatha Massed, MD,as directed by  Doreatha Massed, MD while in the presence of Doreatha Massed, MD.   I, Doreatha Massed MD, have reviewed the above documentation for accuracy and completeness, and I agree with the above.   Doreatha Massed, MD   6/19/20245:37  PM  CHIEF COMPLAINT:   Diagnosis: metastatic carcinoid tumor of small intestine    Cancer Staging  No matching staging information was found for the patient.   Prior Therapy: Sandostatin monthly and everolimus discontinued on 02/01/2022   Current Therapy:  Lanreotide every 2 weeks    HISTORY OF PRESENT ILLNESS:   Oncology History   No history exists.     INTERVAL HISTORY:   Rocklan is a 82 y.o. male presenting to clinic today for follow up of metastatic carcinoid tumor of small intestine. He was last seen by me on 11/15/22.  Since his last visit, he underwent restaging DOTATATE PET scan on 12/27/22 showing: overall minimal change in NET, multiple focal peritoneal metastasis, and mild skeletal metastasis; clear increase in size of dominant central liver lesion.  Today, he states that he is doing well overall. His appetite level is at 75%. His energy level is at 75%.  PAST MEDICAL HISTORY:   Past Medical History: Past Medical History:  Diagnosis Date   Anemia    Cancer (HCC)    right renal . Prostate- Radiation treatment   CHF (congestive heart failure) (HCC)    Chronic kidney disease    Dialysis T/TH/Sa   Edema    Heart murmur    "nothing to worry about"   Hyperlipidemia    Hypertension    Malignant carcinoid tumor (HCC) 01/03/2016   Pneumonia    as a child    Surgical History: Past Surgical History:  Procedure Laterality Date   AV FISTULA PLACEMENT Left 10/20/2014   Procedure: Left Arm ARTERIOVENOUS (AV) FISTULA CREATION;  Surgeon: Sherren Kerns, MD;  Location: [facility];  Service: Vascular;  Laterality: Left;   NEPHRECTOMY Right 2010   PERIPHERAL VASCULAR BALLOON ANGIOPLASTY  06/20/2019   Procedure: PERIPHERAL VASCULAR BALLOON ANGIOPLASTY;  Surgeon: Sherren Kerns, MD;  Location: [facility] INVASIVE CV LAB;  Service: Cardiovascular;;   REVISON OF ARTERIOVENOUS FISTULA Left 08/04/2019   Procedure: REVISON OF ARTERIOVENOUS FISTULA WITH SIDE BRANCH LIGATION;  Surgeon: Sherren Kerns, MD;  Location: [facility] OR;  Service: Vascular;  Laterality: Left;    Social History: Social History   Socioeconomic History   Marital status: Married    Spouse name: Not on file   Number of children: Not on file   Years of education: Not on file   Highest education level: Not on file  Occupational History   Not on file  Tobacco Use   Smoking status: Never   Smokeless tobacco: Never  Vaping Use   Vaping Use: Never used  Substance and Sexual Activity   Alcohol use: No    Alcohol/week: 0.0 standard drinks of alcohol   Drug use: No   Sexual activity: Not on file  Other Topics Concern   Not on file  Social History Narrative   Not on file   Social Determinants of Health   Financial Resource Strain: Low Risk  (05/24/2020)   Overall Financial Resource Strain (CARDIA)    Difficulty of Paying Living Expenses: Not hard at all  Food Insecurity: No Food Insecurity (05/24/2020)   Hunger Vital Sign    Worried About Programme researcher, broadcasting/film/video in  the Last Year: Never true    Ran Out of Food in the Last Year: Never true  Transportation Needs: No Transportation Needs (05/24/2020)   PRAPARE - Administrator, Civil Service (Medical): No    Lack of Transportation (Non-Medical): No  Physical Activity: Insufficiently Active (05/24/2020)   Exercise Vital Sign    Days of Exercise per Week: 7 days    Minutes of Exercise per Session: 10 min  Stress: No Stress Concern Present (05/24/2020)   [employer] of Occupational Health - Occupational Stress Questionnaire    Feeling of Stress : Not at all  Social Connections: Moderately Integrated (05/24/2020)   Social Connection and Isolation Panel [NHANES]    Frequency of Communication with Friends and Family: More than three times a week    Frequency of Social Gatherings with Friends and Family: Once a week    Attends Religious Services: More than 4 times per year    Active Member of [employer] or Organizations: No    Attends Tax inspector Meetings: Never    Marital Status: Married  Catering manager Violence: Not At Risk (05/24/2020)   Humiliation, Afraid, Rape, and Kick questionnaire    Fear of Current or Ex-Partner: No    Emotionally Abused: No    Physically Abused: No    Sexually Abused: No    Family History: Family History  Problem Relation Age of Onset   Cancer Mother    Hypertension Father    Cancer Sister     Current Medications:  Current Outpatient Medications:    amLODipine (NORVASC) 10 MG tablet, Take 1 tablet every day by oral route., Disp: , Rfl:    atorvastatin (LIPITOR) 80 MG tablet, Take 80 mg by mouth daily at 6 PM. , Disp: , Rfl:    calcium acetate (PHOSLO) 667 MG capsule, Take by mouth., Disp: , Rfl:    Calcium Acetate 667 MG TABS, Take 667-2,001 mg by mouth See admin instructions. Take 2001 mg with each meal and 667 mg with each snack, Disp: , Rfl:    Calcium Carb-Cholecalciferol (CALCIUM 500 + D PO), Take 1 tablet by mouth in the morning, at noon, and at bedtime., Disp: , Rfl:    carvedilol (COREG) 6.25 MG tablet, Take 6.25 mg by mouth 2 (two) times daily., Disp: , Rfl:    fexofenadine (ALLEGRA) 180 MG tablet, Take 180 mg by mouth daily., Disp: , Rfl:    furosemide (LASIX) 40 MG tablet, Take 40 mg by mouth every morning., Disp: , Rfl:    mirtazapine (REMERON) 7.5 MG tablet, Take 7.5 mg by mouth at bedtime., Disp: , Rfl:    Omega-3 Fatty Acids (FISH OIL) 1000 MG CAPS, Take 1,000 mg by mouth daily. , Disp: , Rfl:    peginterferon alfa-2a (PEGASYS) 180 MCG/ML injection, Inject 0.75 mLs (135 mcg total) into the skin every 14 (fourteen) days., Disp: 2 mL, Rfl: 1   Riboflavin (B2) 100 MG TABS, Take 100 mg by mouth daily., Disp: , Rfl:    sodium bicarbonate 650 MG tablet, Take 650 mg by mouth 3 (three) times daily., Disp: , Rfl:    sodium bicarbonate 650 MG tablet, Take 1 tablet by mouth 3 (three) times daily., Disp: , Rfl:    TUBERCULIN SYR 1CC/27GX1/2" (B-D TB SYRINGE 1CC/27GX1/2") 27G X  1/2" 1 ML MISC, Use to administer pegasys every 14 days, Disp: 25 each, Rfl: 0   Allergies: No Known Allergies  REVIEW OF SYSTEMS:   Review of Systems  Constitutional:  Negative for chills, fatigue and fever.  HENT:   Negative for lump/mass, mouth sores, nosebleeds, sore throat and trouble swallowing.   Eyes:  Negative for eye problems.  Respiratory:  Negative for cough and shortness of breath.   Cardiovascular:  Negative for chest pain, leg swelling and palpitations.  Gastrointestinal:  Negative for abdominal pain, constipation, diarrhea, nausea and vomiting.  Genitourinary:  Negative for bladder incontinence, difficulty urinating, dysuria, frequency, hematuria and nocturia.   Musculoskeletal:  Negative for arthralgias, back pain, flank pain, myalgias and neck pain.  Skin:  Negative for itching and rash.  Neurological:  Negative for dizziness, headaches and numbness.  Hematological:  Does not bruise/bleed easily.  Psychiatric/Behavioral:  Negative for depression, sleep disturbance and suicidal ideas. The patient is not nervous/anxious.   All other systems reviewed and are negative.    VITALS:   Blood pressure 98/78, pulse 80, temperature 98.8 F (37.1 C), temperature source Oral, resp. rate 16, weight 161 lb 11.2 oz (73.3 kg), SpO2 100 %.  Wt Readings from Last 3 Encounters:  01/03/23 161 lb 11.2 oz (73.3 kg)  11/15/22 160 lb 6.4 oz (72.8 kg)  09/20/22 165 lb 9.6 oz (75.1 kg)    Body mass index is 19.17 kg/m.  Performance status (ECOG): 1 - Symptomatic but completely ambulatory  PHYSICAL EXAM:   Physical Exam Vitals and nursing note reviewed. Exam conducted with a chaperone present.  Constitutional:      Appearance: Normal appearance.  Cardiovascular:     Rate and Rhythm: Normal rate and regular rhythm.     Pulses: Normal pulses.     Heart sounds: Normal heart sounds.  Pulmonary:     Effort: Pulmonary effort is normal.     Breath sounds: Normal breath sounds.   Abdominal:     Palpations: Abdomen is soft. There is no hepatomegaly, splenomegaly or mass.     Tenderness: There is no abdominal tenderness.  Musculoskeletal:     Right lower leg: No edema.     Left lower leg: No edema.  Lymphadenopathy:     Cervical: No cervical adenopathy.     Right cervical: No superficial, deep or posterior cervical adenopathy.    Left cervical: No superficial, deep or posterior cervical adenopathy.     Upper Body:     Right upper body: No supraclavicular or axillary adenopathy.     Left upper body: No supraclavicular or axillary adenopathy.  Neurological:     General: No focal deficit present.     Mental Status: He is alert and oriented to person, place, and time.  Psychiatric:        Mood and Affect: Mood normal.        Behavior: Behavior normal.     LABS:      Latest Ref Rng & Units 12/25/2022   10:11 AM 11/01/2022   10:16 AM 09/06/2022   10:15 AM  CBC  WBC 4.0 - 10.5 K/uL 5.6  4.1  3.9   Hemoglobin 13.0 - 17.0 g/dL 16.1  09.6  04.5   Hematocrit 39.0 - 52.0 % 33.9  34.8  35.1   Platelets 150 - 400 K/uL 121  106  105       Latest Ref Rng & Units 12/25/2022   10:11 AM 11/01/2022   10:16 AM 09/06/2022   10:15 AM  CMP  Glucose 70 - 99 mg/dL 82  409  811   BUN 8 - 23 mg/dL 32  24  48   Creatinine  0.61 - 1.24 mg/dL 1.61  0.96  0.45   Sodium 135 - 145 mmol/L 136  135  134   Potassium 3.5 - 5.1 mmol/L 3.9  4.2  3.7   Chloride 98 - 111 mmol/L 96  97  96   CO2 22 - 32 mmol/L 32  29  28   Calcium 8.9 - 10.3 mg/dL 8.7  8.4  7.9   Total Protein 6.5 - 8.1 g/dL 5.8  5.8  5.8   Total Bilirubin 0.3 - 1.2 mg/dL 0.9  0.9  0.9   Alkaline Phos 38 - 126 U/L 78  83  79   AST 15 - 41 U/L 20  21  27    ALT 0 - 44 U/L 11  13  14       No results found for: "CEA1", "CEA" / No results found for: "CEA1", "CEA" No results found for: "PSA1" No results found for: "WUJ811" No results found for: "CAN125"  No results found for: "TOTALPROTELP", "ALBUMINELP", "A1GS",  "A2GS", "BETS", "BETA2SER", "GAMS", "MSPIKE", "SPEI" Lab Results  Component Value Date   TIBC 226 (L) 08/06/2019   TIBC 229 (L) 06/30/2019   TIBC 175 (L) 06/06/2019   FERRITIN 454 (H) 08/06/2019   FERRITIN 466 (H) 06/30/2019   FERRITIN 767 (H) 06/06/2019   IRONPCTSAT 50 (H) 08/06/2019   IRONPCTSAT 34 06/30/2019   IRONPCTSAT 9 (L) 06/06/2019   Lab Results  Component Value Date   LDH 131 06/12/2022   LDH 163 09/12/2021   LDH 153 07/20/2021     STUDIES:   NM PET DOTATATE SKULL BASE TO MID THIGH  Result Date: 12/27/2022 CLINICAL DATA:  Well differentiated metastatic neuroendocrine tumor. EXAM: NUCLEAR MEDICINE PET SKULL BASE TO THIGH TECHNIQUE: 3.9 mCi copper 64 DOTATATE was injected intravenously. Full-ring PET imaging was performed from the skull base to thigh after the radiotracer. CT data was obtained and used for attenuation correction and anatomic localization. COMPARISON:  DOTATATE PET scan 06/14/2022 FINDINGS: NECK No radiotracer activity in neck lymph nodes. Incidental CT findings: None CHEST No radiotracer accumulation within mediastinal or hilar lymph nodes. No suspicious pulmonary nodules on the CT scan. Incidental CT finding:None ABDOMEN/PELVIS Clear interval increase in low-density lesion within the central LEFT hepatic lobe measuring 6.6 cm (image 112/series 4) compared to 4.0 cm. Lesion is intensely radiotracer avid SUV max 33 compared SUV max 36. Other lesions within LEFT RIGHT hepatic lobe appear very similar. Lesion not easily identified noncontrast exam. New lesions identified Again demonstrate multifocal peritoneal metastasis. Cluster metastatic mesenteric nodules with central calcification in the RIGHT central abdomen measuring 4.9 x 2.2 cm compares to 4.4 by 2.5 cm on prior remeasured and with intense radiotracer activity SUV max equal 23 compared SUV max equal 34. Nodular peritoneal implant within a ventral hernia. The nodular portion within the peritoneum measures 1.5 cm  compared to 1.4 cm with SUV max equal 24 compared SUV max equal 21. The deep peritoneal space/posterior cul-de-sac intense radiotracer nodular density measuring 27 mm (image 182) compared to 30 mm with SUV max equal 16 compared SUV max equal 43. Physiologic activity noted in the liver, spleen, adrenal glands and kidneys. Incidental CT findings:None SKELETON Again demonstrated several foci of radiotracer active within the skeleton. For example lesion the medial LEFT clavicle with SUV max equal 14 compared SUV max equal 5.7. Lesion in the midthoracic spine (image 69) is more prominent. No clear new lesions present. Incidental CT findings:None IMPRESSION: 1. Overall minimal interval change in well differentiated neuroendocrine  tumor metastasis with liver metastasis, multiple focal peritoneal metastasis and mild skeletal metastasis. 2. Dominant lesion central liver is clearly increased in size. Multifocal radiotracer avid additional metastatic lesion liver are not significantly changed. 3. No significant change in multifocal peritoneal nodular metastasis. 4. Mild skeletal metastasis similar to prior. Electronically Signed   By: Genevive Bi M.D.   On: 12/27/2022 16:33

## 2023-01-03 ENCOUNTER — Inpatient Hospital Stay (HOSPITAL_BASED_OUTPATIENT_CLINIC_OR_DEPARTMENT_OTHER): Payer: Medicare Other | Admitting: Hematology

## 2023-01-03 ENCOUNTER — Inpatient Hospital Stay: Payer: Medicare Other

## 2023-01-03 VITALS — BP 98/78 | HR 80 | Temp 98.8°F | Resp 16 | Wt 161.7 lb

## 2023-01-03 DIAGNOSIS — C7A00 Malignant carcinoid tumor of unspecified site: Secondary | ICD-10-CM | POA: Diagnosis not present

## 2023-01-03 DIAGNOSIS — N186 End stage renal disease: Secondary | ICD-10-CM | POA: Diagnosis not present

## 2023-01-03 DIAGNOSIS — Z992 Dependence on renal dialysis: Secondary | ICD-10-CM | POA: Diagnosis not present

## 2023-01-03 DIAGNOSIS — C7B02 Secondary carcinoid tumors of liver: Secondary | ICD-10-CM | POA: Diagnosis not present

## 2023-01-03 DIAGNOSIS — D649 Anemia, unspecified: Secondary | ICD-10-CM | POA: Diagnosis not present

## 2023-01-03 DIAGNOSIS — I132 Hypertensive heart and chronic kidney disease with heart failure and with stage 5 chronic kidney disease, or end stage renal disease: Secondary | ICD-10-CM | POA: Diagnosis not present

## 2023-01-03 MED ORDER — LANREOTIDE ACETATE 120 MG/0.5ML ~~LOC~~ SOLN
120.0000 mg | Freq: Once | SUBCUTANEOUS | Status: DC
  Filled 2023-01-03: qty 120

## 2023-01-03 NOTE — Patient Instructions (Addendum)
[facility] at [facility] Discharge Instructions   You were seen and examined today by Dr. Ellin Saba.  He reviewed the results of your PET scan. It is showing the cancer in the liver is growing.   We will stop the injections as they are no longer working to control the cancer.   He discussed with you treatment with chemotherapy, as there are no other options for treatment due to your being on dialysis. Chemotherapy has side effects such as nausea, vomiting, diarrhea, decreased blood counts among other things. He also discussed with you no treatment, to just take it easy, as this is a slow growing cancer. This is the option you've chosen.  We will see you back in 4 months.    Thank you for choosing [facility] at [facility] to provide your oncology and hematology care.  To afford each patient quality time with our provider, please arrive at least 15 minutes before your scheduled appointment time.   If you have a lab appointment with the Cancer Center please come in thru the Main Entrance and check in at the main information desk.  You need to re-schedule your appointment should you arrive 10 or more minutes late.  We strive to give you quality time with our providers, and arriving late affects you and other patients whose appointments are after yours.  Also, if you no show three or more times for appointments you may be dismissed from the clinic at the providers discretion.     Again, thank you for choosing [facility].  Our hope is that these requests will decrease the amount of time that you wait before being seen by our physicians.       _____________________________________________________________  Should you have questions after your visit to [facility], please contact our office at [phone] and follow the prompts.  Our office hours are 8:00 a.m. and 4:30 p.m. Monday - Friday.  Please note that voicemails left  after 4:00 p.m. may not be returned until the following business day.  We are closed weekends and major holidays.  You do have access to a nurse 24-7, just call the main number to the clinic [phone] and do not press any options, hold on the line and a nurse will answer the phone.    For prescription refill requests, have your pharmacy contact our office and allow 72 hours.    Due to Covid, you will need to wear a mask upon entering the hospital. If you do not have a mask, a mask will be given to you at the Main Entrance upon arrival. For doctor visits, patients may have 1 support person age 26 or older with them. For treatment visits, patients can not have anyone with them due to social distancing guidelines and our immunocompromised population.

## 2023-01-03 NOTE — Addendum Note (Signed)
Addended by: [name] Ochoa E on: 01/03/2023 11:54 AM   Modules accepted: Orders

## 2023-01-03 NOTE — Progress Notes (Signed)
All injections discontinued per MD

## 2023-01-04 DIAGNOSIS — D509 Iron deficiency anemia, unspecified: Secondary | ICD-10-CM | POA: Diagnosis not present

## 2023-01-04 DIAGNOSIS — D631 Anemia in chronic kidney disease: Secondary | ICD-10-CM | POA: Diagnosis not present

## 2023-01-04 DIAGNOSIS — Z992 Dependence on renal dialysis: Secondary | ICD-10-CM | POA: Diagnosis not present

## 2023-01-04 DIAGNOSIS — N250 Renal osteodystrophy: Secondary | ICD-10-CM | POA: Diagnosis not present

## 2023-01-04 DIAGNOSIS — N186 End stage renal disease: Secondary | ICD-10-CM | POA: Diagnosis not present

## 2023-01-06 DIAGNOSIS — D631 Anemia in chronic kidney disease: Secondary | ICD-10-CM | POA: Diagnosis not present

## 2023-01-06 DIAGNOSIS — N186 End stage renal disease: Secondary | ICD-10-CM | POA: Diagnosis not present

## 2023-01-06 DIAGNOSIS — D509 Iron deficiency anemia, unspecified: Secondary | ICD-10-CM | POA: Diagnosis not present

## 2023-01-06 DIAGNOSIS — N250 Renal osteodystrophy: Secondary | ICD-10-CM | POA: Diagnosis not present

## 2023-01-06 DIAGNOSIS — Z992 Dependence on renal dialysis: Secondary | ICD-10-CM | POA: Diagnosis not present

## 2023-01-09 DIAGNOSIS — D509 Iron deficiency anemia, unspecified: Secondary | ICD-10-CM | POA: Diagnosis not present

## 2023-01-09 DIAGNOSIS — N250 Renal osteodystrophy: Secondary | ICD-10-CM | POA: Diagnosis not present

## 2023-01-09 DIAGNOSIS — D631 Anemia in chronic kidney disease: Secondary | ICD-10-CM | POA: Diagnosis not present

## 2023-01-09 DIAGNOSIS — Z992 Dependence on renal dialysis: Secondary | ICD-10-CM | POA: Diagnosis not present

## 2023-01-09 DIAGNOSIS — N186 End stage renal disease: Secondary | ICD-10-CM | POA: Diagnosis not present

## 2023-01-11 DIAGNOSIS — D509 Iron deficiency anemia, unspecified: Secondary | ICD-10-CM | POA: Diagnosis not present

## 2023-01-11 DIAGNOSIS — N250 Renal osteodystrophy: Secondary | ICD-10-CM | POA: Diagnosis not present

## 2023-01-11 DIAGNOSIS — N186 End stage renal disease: Secondary | ICD-10-CM | POA: Diagnosis not present

## 2023-01-11 DIAGNOSIS — Z992 Dependence on renal dialysis: Secondary | ICD-10-CM | POA: Diagnosis not present

## 2023-01-11 DIAGNOSIS — D631 Anemia in chronic kidney disease: Secondary | ICD-10-CM | POA: Diagnosis not present

## 2023-01-13 DIAGNOSIS — D631 Anemia in chronic kidney disease: Secondary | ICD-10-CM | POA: Diagnosis not present

## 2023-01-13 DIAGNOSIS — Z992 Dependence on renal dialysis: Secondary | ICD-10-CM | POA: Diagnosis not present

## 2023-01-13 DIAGNOSIS — D509 Iron deficiency anemia, unspecified: Secondary | ICD-10-CM | POA: Diagnosis not present

## 2023-01-13 DIAGNOSIS — N250 Renal osteodystrophy: Secondary | ICD-10-CM | POA: Diagnosis not present

## 2023-01-13 DIAGNOSIS — N186 End stage renal disease: Secondary | ICD-10-CM | POA: Diagnosis not present

## 2023-01-14 DIAGNOSIS — N186 End stage renal disease: Secondary | ICD-10-CM | POA: Diagnosis not present

## 2023-01-14 DIAGNOSIS — Z992 Dependence on renal dialysis: Secondary | ICD-10-CM | POA: Diagnosis not present

## 2023-01-16 DIAGNOSIS — D631 Anemia in chronic kidney disease: Secondary | ICD-10-CM | POA: Diagnosis not present

## 2023-01-16 DIAGNOSIS — N186 End stage renal disease: Secondary | ICD-10-CM | POA: Diagnosis not present

## 2023-01-16 DIAGNOSIS — D509 Iron deficiency anemia, unspecified: Secondary | ICD-10-CM | POA: Diagnosis not present

## 2023-01-16 DIAGNOSIS — Z992 Dependence on renal dialysis: Secondary | ICD-10-CM | POA: Diagnosis not present

## 2023-01-16 DIAGNOSIS — N250 Renal osteodystrophy: Secondary | ICD-10-CM | POA: Diagnosis not present

## 2023-01-18 DIAGNOSIS — N186 End stage renal disease: Secondary | ICD-10-CM | POA: Diagnosis not present

## 2023-01-18 DIAGNOSIS — Z992 Dependence on renal dialysis: Secondary | ICD-10-CM | POA: Diagnosis not present

## 2023-01-18 DIAGNOSIS — D631 Anemia in chronic kidney disease: Secondary | ICD-10-CM | POA: Diagnosis not present

## 2023-01-18 DIAGNOSIS — D509 Iron deficiency anemia, unspecified: Secondary | ICD-10-CM | POA: Diagnosis not present

## 2023-01-18 DIAGNOSIS — N250 Renal osteodystrophy: Secondary | ICD-10-CM | POA: Diagnosis not present

## 2023-01-20 DIAGNOSIS — N250 Renal osteodystrophy: Secondary | ICD-10-CM | POA: Diagnosis not present

## 2023-01-20 DIAGNOSIS — D509 Iron deficiency anemia, unspecified: Secondary | ICD-10-CM | POA: Diagnosis not present

## 2023-01-20 DIAGNOSIS — N186 End stage renal disease: Secondary | ICD-10-CM | POA: Diagnosis not present

## 2023-01-20 DIAGNOSIS — D631 Anemia in chronic kidney disease: Secondary | ICD-10-CM | POA: Diagnosis not present

## 2023-01-20 DIAGNOSIS — Z992 Dependence on renal dialysis: Secondary | ICD-10-CM | POA: Diagnosis not present

## 2023-01-22 DIAGNOSIS — E1151 Type 2 diabetes mellitus with diabetic peripheral angiopathy without gangrene: Secondary | ICD-10-CM | POA: Diagnosis not present

## 2023-01-22 DIAGNOSIS — E1140 Type 2 diabetes mellitus with diabetic neuropathy, unspecified: Secondary | ICD-10-CM | POA: Diagnosis not present

## 2023-01-23 DIAGNOSIS — D509 Iron deficiency anemia, unspecified: Secondary | ICD-10-CM | POA: Diagnosis not present

## 2023-01-23 DIAGNOSIS — D631 Anemia in chronic kidney disease: Secondary | ICD-10-CM | POA: Diagnosis not present

## 2023-01-23 DIAGNOSIS — Z992 Dependence on renal dialysis: Secondary | ICD-10-CM | POA: Diagnosis not present

## 2023-01-23 DIAGNOSIS — N250 Renal osteodystrophy: Secondary | ICD-10-CM | POA: Diagnosis not present

## 2023-01-23 DIAGNOSIS — N186 End stage renal disease: Secondary | ICD-10-CM | POA: Diagnosis not present

## 2023-01-25 DIAGNOSIS — N186 End stage renal disease: Secondary | ICD-10-CM | POA: Diagnosis not present

## 2023-01-25 DIAGNOSIS — D509 Iron deficiency anemia, unspecified: Secondary | ICD-10-CM | POA: Diagnosis not present

## 2023-01-25 DIAGNOSIS — Z992 Dependence on renal dialysis: Secondary | ICD-10-CM | POA: Diagnosis not present

## 2023-01-25 DIAGNOSIS — D631 Anemia in chronic kidney disease: Secondary | ICD-10-CM | POA: Diagnosis not present

## 2023-01-25 DIAGNOSIS — N250 Renal osteodystrophy: Secondary | ICD-10-CM | POA: Diagnosis not present

## 2023-01-27 DIAGNOSIS — D509 Iron deficiency anemia, unspecified: Secondary | ICD-10-CM | POA: Diagnosis not present

## 2023-01-27 DIAGNOSIS — Z992 Dependence on renal dialysis: Secondary | ICD-10-CM | POA: Diagnosis not present

## 2023-01-27 DIAGNOSIS — N186 End stage renal disease: Secondary | ICD-10-CM | POA: Diagnosis not present

## 2023-01-27 DIAGNOSIS — N250 Renal osteodystrophy: Secondary | ICD-10-CM | POA: Diagnosis not present

## 2023-01-27 DIAGNOSIS — D631 Anemia in chronic kidney disease: Secondary | ICD-10-CM | POA: Diagnosis not present

## 2023-01-30 DIAGNOSIS — D509 Iron deficiency anemia, unspecified: Secondary | ICD-10-CM | POA: Diagnosis not present

## 2023-01-30 DIAGNOSIS — D631 Anemia in chronic kidney disease: Secondary | ICD-10-CM | POA: Diagnosis not present

## 2023-01-30 DIAGNOSIS — N250 Renal osteodystrophy: Secondary | ICD-10-CM | POA: Diagnosis not present

## 2023-01-30 DIAGNOSIS — N186 End stage renal disease: Secondary | ICD-10-CM | POA: Diagnosis not present

## 2023-01-30 DIAGNOSIS — Z992 Dependence on renal dialysis: Secondary | ICD-10-CM | POA: Diagnosis not present

## 2023-02-01 DIAGNOSIS — N250 Renal osteodystrophy: Secondary | ICD-10-CM | POA: Diagnosis not present

## 2023-02-01 DIAGNOSIS — N186 End stage renal disease: Secondary | ICD-10-CM | POA: Diagnosis not present

## 2023-02-01 DIAGNOSIS — D631 Anemia in chronic kidney disease: Secondary | ICD-10-CM | POA: Diagnosis not present

## 2023-02-01 DIAGNOSIS — D509 Iron deficiency anemia, unspecified: Secondary | ICD-10-CM | POA: Diagnosis not present

## 2023-02-01 DIAGNOSIS — Z992 Dependence on renal dialysis: Secondary | ICD-10-CM | POA: Diagnosis not present

## 2023-02-02 DIAGNOSIS — R499 Unspecified voice and resonance disorder: Secondary | ICD-10-CM | POA: Diagnosis not present

## 2023-02-02 DIAGNOSIS — H6121 Impacted cerumen, right ear: Secondary | ICD-10-CM | POA: Diagnosis not present

## 2023-02-02 DIAGNOSIS — J387 Other diseases of larynx: Secondary | ICD-10-CM | POA: Diagnosis not present

## 2023-02-02 DIAGNOSIS — Z8521 Personal history of malignant neoplasm of larynx: Secondary | ICD-10-CM | POA: Diagnosis not present

## 2023-02-03 DIAGNOSIS — Z992 Dependence on renal dialysis: Secondary | ICD-10-CM | POA: Diagnosis not present

## 2023-02-03 DIAGNOSIS — D631 Anemia in chronic kidney disease: Secondary | ICD-10-CM | POA: Diagnosis not present

## 2023-02-03 DIAGNOSIS — N186 End stage renal disease: Secondary | ICD-10-CM | POA: Diagnosis not present

## 2023-02-03 DIAGNOSIS — N250 Renal osteodystrophy: Secondary | ICD-10-CM | POA: Diagnosis not present

## 2023-02-03 DIAGNOSIS — D509 Iron deficiency anemia, unspecified: Secondary | ICD-10-CM | POA: Diagnosis not present

## 2023-02-06 DIAGNOSIS — Z992 Dependence on renal dialysis: Secondary | ICD-10-CM | POA: Diagnosis not present

## 2023-02-06 DIAGNOSIS — N186 End stage renal disease: Secondary | ICD-10-CM | POA: Diagnosis not present

## 2023-02-06 DIAGNOSIS — D631 Anemia in chronic kidney disease: Secondary | ICD-10-CM | POA: Diagnosis not present

## 2023-02-06 DIAGNOSIS — D509 Iron deficiency anemia, unspecified: Secondary | ICD-10-CM | POA: Diagnosis not present

## 2023-02-06 DIAGNOSIS — N250 Renal osteodystrophy: Secondary | ICD-10-CM | POA: Diagnosis not present

## 2023-02-08 DIAGNOSIS — N250 Renal osteodystrophy: Secondary | ICD-10-CM | POA: Diagnosis not present

## 2023-02-08 DIAGNOSIS — D631 Anemia in chronic kidney disease: Secondary | ICD-10-CM | POA: Diagnosis not present

## 2023-02-08 DIAGNOSIS — D509 Iron deficiency anemia, unspecified: Secondary | ICD-10-CM | POA: Diagnosis not present

## 2023-02-08 DIAGNOSIS — N186 End stage renal disease: Secondary | ICD-10-CM | POA: Diagnosis not present

## 2023-02-08 DIAGNOSIS — Z992 Dependence on renal dialysis: Secondary | ICD-10-CM | POA: Diagnosis not present

## 2023-02-10 DIAGNOSIS — N250 Renal osteodystrophy: Secondary | ICD-10-CM | POA: Diagnosis not present

## 2023-02-10 DIAGNOSIS — N186 End stage renal disease: Secondary | ICD-10-CM | POA: Diagnosis not present

## 2023-02-10 DIAGNOSIS — D509 Iron deficiency anemia, unspecified: Secondary | ICD-10-CM | POA: Diagnosis not present

## 2023-02-10 DIAGNOSIS — Z992 Dependence on renal dialysis: Secondary | ICD-10-CM | POA: Diagnosis not present

## 2023-02-10 DIAGNOSIS — D631 Anemia in chronic kidney disease: Secondary | ICD-10-CM | POA: Diagnosis not present

## 2023-02-13 DIAGNOSIS — Z992 Dependence on renal dialysis: Secondary | ICD-10-CM | POA: Diagnosis not present

## 2023-02-13 DIAGNOSIS — N250 Renal osteodystrophy: Secondary | ICD-10-CM | POA: Diagnosis not present

## 2023-02-13 DIAGNOSIS — N186 End stage renal disease: Secondary | ICD-10-CM | POA: Diagnosis not present

## 2023-02-13 DIAGNOSIS — D631 Anemia in chronic kidney disease: Secondary | ICD-10-CM | POA: Diagnosis not present

## 2023-02-13 DIAGNOSIS — D509 Iron deficiency anemia, unspecified: Secondary | ICD-10-CM | POA: Diagnosis not present

## 2023-02-14 DIAGNOSIS — N186 End stage renal disease: Secondary | ICD-10-CM | POA: Diagnosis not present

## 2023-02-14 DIAGNOSIS — Z992 Dependence on renal dialysis: Secondary | ICD-10-CM | POA: Diagnosis not present

## 2023-02-15 DIAGNOSIS — D509 Iron deficiency anemia, unspecified: Secondary | ICD-10-CM | POA: Diagnosis not present

## 2023-02-15 DIAGNOSIS — N250 Renal osteodystrophy: Secondary | ICD-10-CM | POA: Diagnosis not present

## 2023-02-15 DIAGNOSIS — Z992 Dependence on renal dialysis: Secondary | ICD-10-CM | POA: Diagnosis not present

## 2023-02-15 DIAGNOSIS — D631 Anemia in chronic kidney disease: Secondary | ICD-10-CM | POA: Diagnosis not present

## 2023-02-15 DIAGNOSIS — N186 End stage renal disease: Secondary | ICD-10-CM | POA: Diagnosis not present

## 2023-02-17 DIAGNOSIS — D631 Anemia in chronic kidney disease: Secondary | ICD-10-CM | POA: Diagnosis not present

## 2023-02-17 DIAGNOSIS — D509 Iron deficiency anemia, unspecified: Secondary | ICD-10-CM | POA: Diagnosis not present

## 2023-02-17 DIAGNOSIS — Z992 Dependence on renal dialysis: Secondary | ICD-10-CM | POA: Diagnosis not present

## 2023-02-17 DIAGNOSIS — N186 End stage renal disease: Secondary | ICD-10-CM | POA: Diagnosis not present

## 2023-02-17 DIAGNOSIS — N250 Renal osteodystrophy: Secondary | ICD-10-CM | POA: Diagnosis not present

## 2023-02-20 DIAGNOSIS — N186 End stage renal disease: Secondary | ICD-10-CM | POA: Diagnosis not present

## 2023-02-20 DIAGNOSIS — Z992 Dependence on renal dialysis: Secondary | ICD-10-CM | POA: Diagnosis not present

## 2023-02-20 DIAGNOSIS — D509 Iron deficiency anemia, unspecified: Secondary | ICD-10-CM | POA: Diagnosis not present

## 2023-02-20 DIAGNOSIS — D631 Anemia in chronic kidney disease: Secondary | ICD-10-CM | POA: Diagnosis not present

## 2023-02-20 DIAGNOSIS — N250 Renal osteodystrophy: Secondary | ICD-10-CM | POA: Diagnosis not present

## 2023-02-22 DIAGNOSIS — N250 Renal osteodystrophy: Secondary | ICD-10-CM | POA: Diagnosis not present

## 2023-02-22 DIAGNOSIS — N186 End stage renal disease: Secondary | ICD-10-CM | POA: Diagnosis not present

## 2023-02-22 DIAGNOSIS — Z992 Dependence on renal dialysis: Secondary | ICD-10-CM | POA: Diagnosis not present

## 2023-02-22 DIAGNOSIS — D509 Iron deficiency anemia, unspecified: Secondary | ICD-10-CM | POA: Diagnosis not present

## 2023-02-22 DIAGNOSIS — D631 Anemia in chronic kidney disease: Secondary | ICD-10-CM | POA: Diagnosis not present

## 2023-02-24 DIAGNOSIS — D631 Anemia in chronic kidney disease: Secondary | ICD-10-CM | POA: Diagnosis not present

## 2023-02-24 DIAGNOSIS — D509 Iron deficiency anemia, unspecified: Secondary | ICD-10-CM | POA: Diagnosis not present

## 2023-02-24 DIAGNOSIS — Z992 Dependence on renal dialysis: Secondary | ICD-10-CM | POA: Diagnosis not present

## 2023-02-24 DIAGNOSIS — N250 Renal osteodystrophy: Secondary | ICD-10-CM | POA: Diagnosis not present

## 2023-02-24 DIAGNOSIS — N186 End stage renal disease: Secondary | ICD-10-CM | POA: Diagnosis not present

## 2023-02-27 DIAGNOSIS — J302 Other seasonal allergic rhinitis: Secondary | ICD-10-CM | POA: Diagnosis not present

## 2023-02-27 DIAGNOSIS — N186 End stage renal disease: Secondary | ICD-10-CM | POA: Diagnosis not present

## 2023-02-27 DIAGNOSIS — Z992 Dependence on renal dialysis: Secondary | ICD-10-CM | POA: Diagnosis not present

## 2023-02-27 DIAGNOSIS — Z1331 Encounter for screening for depression: Secondary | ICD-10-CM | POA: Diagnosis not present

## 2023-02-27 DIAGNOSIS — D509 Iron deficiency anemia, unspecified: Secondary | ICD-10-CM | POA: Diagnosis not present

## 2023-02-27 DIAGNOSIS — M818 Other osteoporosis without current pathological fracture: Secondary | ICD-10-CM | POA: Diagnosis not present

## 2023-02-27 DIAGNOSIS — Z0000 Encounter for general adult medical examination without abnormal findings: Secondary | ICD-10-CM | POA: Diagnosis not present

## 2023-02-27 DIAGNOSIS — E7849 Other hyperlipidemia: Secondary | ICD-10-CM | POA: Diagnosis not present

## 2023-02-27 DIAGNOSIS — D631 Anemia in chronic kidney disease: Secondary | ICD-10-CM | POA: Diagnosis not present

## 2023-02-27 DIAGNOSIS — N250 Renal osteodystrophy: Secondary | ICD-10-CM | POA: Diagnosis not present

## 2023-02-27 DIAGNOSIS — I10 Essential (primary) hypertension: Secondary | ICD-10-CM | POA: Diagnosis not present

## 2023-03-01 DIAGNOSIS — D631 Anemia in chronic kidney disease: Secondary | ICD-10-CM | POA: Diagnosis not present

## 2023-03-01 DIAGNOSIS — Z992 Dependence on renal dialysis: Secondary | ICD-10-CM | POA: Diagnosis not present

## 2023-03-01 DIAGNOSIS — N186 End stage renal disease: Secondary | ICD-10-CM | POA: Diagnosis not present

## 2023-03-01 DIAGNOSIS — N250 Renal osteodystrophy: Secondary | ICD-10-CM | POA: Diagnosis not present

## 2023-03-01 DIAGNOSIS — D509 Iron deficiency anemia, unspecified: Secondary | ICD-10-CM | POA: Diagnosis not present

## 2023-03-03 DIAGNOSIS — N186 End stage renal disease: Secondary | ICD-10-CM | POA: Diagnosis not present

## 2023-03-03 DIAGNOSIS — D631 Anemia in chronic kidney disease: Secondary | ICD-10-CM | POA: Diagnosis not present

## 2023-03-03 DIAGNOSIS — D509 Iron deficiency anemia, unspecified: Secondary | ICD-10-CM | POA: Diagnosis not present

## 2023-03-03 DIAGNOSIS — N250 Renal osteodystrophy: Secondary | ICD-10-CM | POA: Diagnosis not present

## 2023-03-03 DIAGNOSIS — Z992 Dependence on renal dialysis: Secondary | ICD-10-CM | POA: Diagnosis not present

## 2023-03-06 DIAGNOSIS — N186 End stage renal disease: Secondary | ICD-10-CM | POA: Diagnosis not present

## 2023-03-06 DIAGNOSIS — D509 Iron deficiency anemia, unspecified: Secondary | ICD-10-CM | POA: Diagnosis not present

## 2023-03-06 DIAGNOSIS — N250 Renal osteodystrophy: Secondary | ICD-10-CM | POA: Diagnosis not present

## 2023-03-06 DIAGNOSIS — D631 Anemia in chronic kidney disease: Secondary | ICD-10-CM | POA: Diagnosis not present

## 2023-03-06 DIAGNOSIS — Z992 Dependence on renal dialysis: Secondary | ICD-10-CM | POA: Diagnosis not present

## 2023-03-08 DIAGNOSIS — N250 Renal osteodystrophy: Secondary | ICD-10-CM | POA: Diagnosis not present

## 2023-03-08 DIAGNOSIS — D631 Anemia in chronic kidney disease: Secondary | ICD-10-CM | POA: Diagnosis not present

## 2023-03-08 DIAGNOSIS — N186 End stage renal disease: Secondary | ICD-10-CM | POA: Diagnosis not present

## 2023-03-08 DIAGNOSIS — Z992 Dependence on renal dialysis: Secondary | ICD-10-CM | POA: Diagnosis not present

## 2023-03-08 DIAGNOSIS — D509 Iron deficiency anemia, unspecified: Secondary | ICD-10-CM | POA: Diagnosis not present

## 2023-03-10 DIAGNOSIS — D631 Anemia in chronic kidney disease: Secondary | ICD-10-CM | POA: Diagnosis not present

## 2023-03-10 DIAGNOSIS — Z992 Dependence on renal dialysis: Secondary | ICD-10-CM | POA: Diagnosis not present

## 2023-03-10 DIAGNOSIS — N186 End stage renal disease: Secondary | ICD-10-CM | POA: Diagnosis not present

## 2023-03-10 DIAGNOSIS — N250 Renal osteodystrophy: Secondary | ICD-10-CM | POA: Diagnosis not present

## 2023-03-10 DIAGNOSIS — D509 Iron deficiency anemia, unspecified: Secondary | ICD-10-CM | POA: Diagnosis not present

## 2023-03-13 DIAGNOSIS — N250 Renal osteodystrophy: Secondary | ICD-10-CM | POA: Diagnosis not present

## 2023-03-13 DIAGNOSIS — N186 End stage renal disease: Secondary | ICD-10-CM | POA: Diagnosis not present

## 2023-03-13 DIAGNOSIS — Z992 Dependence on renal dialysis: Secondary | ICD-10-CM | POA: Diagnosis not present

## 2023-03-13 DIAGNOSIS — D631 Anemia in chronic kidney disease: Secondary | ICD-10-CM | POA: Diagnosis not present

## 2023-03-13 DIAGNOSIS — D509 Iron deficiency anemia, unspecified: Secondary | ICD-10-CM | POA: Diagnosis not present

## 2023-03-15 DIAGNOSIS — D509 Iron deficiency anemia, unspecified: Secondary | ICD-10-CM | POA: Diagnosis not present

## 2023-03-15 DIAGNOSIS — N186 End stage renal disease: Secondary | ICD-10-CM | POA: Diagnosis not present

## 2023-03-15 DIAGNOSIS — D631 Anemia in chronic kidney disease: Secondary | ICD-10-CM | POA: Diagnosis not present

## 2023-03-15 DIAGNOSIS — Z992 Dependence on renal dialysis: Secondary | ICD-10-CM | POA: Diagnosis not present

## 2023-03-15 DIAGNOSIS — N250 Renal osteodystrophy: Secondary | ICD-10-CM | POA: Diagnosis not present

## 2023-03-17 DIAGNOSIS — N186 End stage renal disease: Secondary | ICD-10-CM | POA: Diagnosis not present

## 2023-03-17 DIAGNOSIS — D631 Anemia in chronic kidney disease: Secondary | ICD-10-CM | POA: Diagnosis not present

## 2023-03-17 DIAGNOSIS — N250 Renal osteodystrophy: Secondary | ICD-10-CM | POA: Diagnosis not present

## 2023-03-17 DIAGNOSIS — Z992 Dependence on renal dialysis: Secondary | ICD-10-CM | POA: Diagnosis not present

## 2023-03-17 DIAGNOSIS — D509 Iron deficiency anemia, unspecified: Secondary | ICD-10-CM | POA: Diagnosis not present

## 2023-03-20 DIAGNOSIS — N250 Renal osteodystrophy: Secondary | ICD-10-CM | POA: Diagnosis not present

## 2023-03-20 DIAGNOSIS — D631 Anemia in chronic kidney disease: Secondary | ICD-10-CM | POA: Diagnosis not present

## 2023-03-20 DIAGNOSIS — Z992 Dependence on renal dialysis: Secondary | ICD-10-CM | POA: Diagnosis not present

## 2023-03-20 DIAGNOSIS — D509 Iron deficiency anemia, unspecified: Secondary | ICD-10-CM | POA: Diagnosis not present

## 2023-03-20 DIAGNOSIS — N186 End stage renal disease: Secondary | ICD-10-CM | POA: Diagnosis not present

## 2023-03-22 DIAGNOSIS — D509 Iron deficiency anemia, unspecified: Secondary | ICD-10-CM | POA: Diagnosis not present

## 2023-03-22 DIAGNOSIS — N186 End stage renal disease: Secondary | ICD-10-CM | POA: Diagnosis not present

## 2023-03-22 DIAGNOSIS — Z992 Dependence on renal dialysis: Secondary | ICD-10-CM | POA: Diagnosis not present

## 2023-03-22 DIAGNOSIS — D631 Anemia in chronic kidney disease: Secondary | ICD-10-CM | POA: Diagnosis not present

## 2023-03-22 DIAGNOSIS — N250 Renal osteodystrophy: Secondary | ICD-10-CM | POA: Diagnosis not present

## 2023-03-24 DIAGNOSIS — D631 Anemia in chronic kidney disease: Secondary | ICD-10-CM | POA: Diagnosis not present

## 2023-03-24 DIAGNOSIS — D509 Iron deficiency anemia, unspecified: Secondary | ICD-10-CM | POA: Diagnosis not present

## 2023-03-24 DIAGNOSIS — N186 End stage renal disease: Secondary | ICD-10-CM | POA: Diagnosis not present

## 2023-03-24 DIAGNOSIS — Z992 Dependence on renal dialysis: Secondary | ICD-10-CM | POA: Diagnosis not present

## 2023-03-24 DIAGNOSIS — N250 Renal osteodystrophy: Secondary | ICD-10-CM | POA: Diagnosis not present

## 2023-03-27 DIAGNOSIS — D631 Anemia in chronic kidney disease: Secondary | ICD-10-CM | POA: Diagnosis not present

## 2023-03-27 DIAGNOSIS — D509 Iron deficiency anemia, unspecified: Secondary | ICD-10-CM | POA: Diagnosis not present

## 2023-03-27 DIAGNOSIS — N186 End stage renal disease: Secondary | ICD-10-CM | POA: Diagnosis not present

## 2023-03-27 DIAGNOSIS — Z992 Dependence on renal dialysis: Secondary | ICD-10-CM | POA: Diagnosis not present

## 2023-03-27 DIAGNOSIS — N250 Renal osteodystrophy: Secondary | ICD-10-CM | POA: Diagnosis not present

## 2023-03-29 DIAGNOSIS — N186 End stage renal disease: Secondary | ICD-10-CM | POA: Diagnosis not present

## 2023-03-29 DIAGNOSIS — D509 Iron deficiency anemia, unspecified: Secondary | ICD-10-CM | POA: Diagnosis not present

## 2023-03-29 DIAGNOSIS — N250 Renal osteodystrophy: Secondary | ICD-10-CM | POA: Diagnosis not present

## 2023-03-29 DIAGNOSIS — Z992 Dependence on renal dialysis: Secondary | ICD-10-CM | POA: Diagnosis not present

## 2023-03-29 DIAGNOSIS — D631 Anemia in chronic kidney disease: Secondary | ICD-10-CM | POA: Diagnosis not present

## 2023-03-31 DIAGNOSIS — N250 Renal osteodystrophy: Secondary | ICD-10-CM | POA: Diagnosis not present

## 2023-03-31 DIAGNOSIS — D631 Anemia in chronic kidney disease: Secondary | ICD-10-CM | POA: Diagnosis not present

## 2023-03-31 DIAGNOSIS — N186 End stage renal disease: Secondary | ICD-10-CM | POA: Diagnosis not present

## 2023-03-31 DIAGNOSIS — D509 Iron deficiency anemia, unspecified: Secondary | ICD-10-CM | POA: Diagnosis not present

## 2023-03-31 DIAGNOSIS — Z992 Dependence on renal dialysis: Secondary | ICD-10-CM | POA: Diagnosis not present

## 2023-04-03 DIAGNOSIS — D509 Iron deficiency anemia, unspecified: Secondary | ICD-10-CM | POA: Diagnosis not present

## 2023-04-03 DIAGNOSIS — Z992 Dependence on renal dialysis: Secondary | ICD-10-CM | POA: Diagnosis not present

## 2023-04-03 DIAGNOSIS — D631 Anemia in chronic kidney disease: Secondary | ICD-10-CM | POA: Diagnosis not present

## 2023-04-03 DIAGNOSIS — N186 End stage renal disease: Secondary | ICD-10-CM | POA: Diagnosis not present

## 2023-04-03 DIAGNOSIS — N250 Renal osteodystrophy: Secondary | ICD-10-CM | POA: Diagnosis not present

## 2023-04-05 DIAGNOSIS — D509 Iron deficiency anemia, unspecified: Secondary | ICD-10-CM | POA: Diagnosis not present

## 2023-04-05 DIAGNOSIS — N250 Renal osteodystrophy: Secondary | ICD-10-CM | POA: Diagnosis not present

## 2023-04-05 DIAGNOSIS — Z992 Dependence on renal dialysis: Secondary | ICD-10-CM | POA: Diagnosis not present

## 2023-04-05 DIAGNOSIS — N186 End stage renal disease: Secondary | ICD-10-CM | POA: Diagnosis not present

## 2023-04-05 DIAGNOSIS — D631 Anemia in chronic kidney disease: Secondary | ICD-10-CM | POA: Diagnosis not present

## 2023-04-07 DIAGNOSIS — D631 Anemia in chronic kidney disease: Secondary | ICD-10-CM | POA: Diagnosis not present

## 2023-04-07 DIAGNOSIS — N186 End stage renal disease: Secondary | ICD-10-CM | POA: Diagnosis not present

## 2023-04-07 DIAGNOSIS — Z992 Dependence on renal dialysis: Secondary | ICD-10-CM | POA: Diagnosis not present

## 2023-04-07 DIAGNOSIS — N250 Renal osteodystrophy: Secondary | ICD-10-CM | POA: Diagnosis not present

## 2023-04-07 DIAGNOSIS — D509 Iron deficiency anemia, unspecified: Secondary | ICD-10-CM | POA: Diagnosis not present

## 2023-04-09 DIAGNOSIS — E1151 Type 2 diabetes mellitus with diabetic peripheral angiopathy without gangrene: Secondary | ICD-10-CM | POA: Diagnosis not present

## 2023-04-09 DIAGNOSIS — E1140 Type 2 diabetes mellitus with diabetic neuropathy, unspecified: Secondary | ICD-10-CM | POA: Diagnosis not present

## 2023-04-10 DIAGNOSIS — Z992 Dependence on renal dialysis: Secondary | ICD-10-CM | POA: Diagnosis not present

## 2023-04-10 DIAGNOSIS — N250 Renal osteodystrophy: Secondary | ICD-10-CM | POA: Diagnosis not present

## 2023-04-10 DIAGNOSIS — D631 Anemia in chronic kidney disease: Secondary | ICD-10-CM | POA: Diagnosis not present

## 2023-04-10 DIAGNOSIS — D509 Iron deficiency anemia, unspecified: Secondary | ICD-10-CM | POA: Diagnosis not present

## 2023-04-10 DIAGNOSIS — N186 End stage renal disease: Secondary | ICD-10-CM | POA: Diagnosis not present

## 2023-04-12 DIAGNOSIS — Z992 Dependence on renal dialysis: Secondary | ICD-10-CM | POA: Diagnosis not present

## 2023-04-12 DIAGNOSIS — N186 End stage renal disease: Secondary | ICD-10-CM | POA: Diagnosis not present

## 2023-04-12 DIAGNOSIS — N250 Renal osteodystrophy: Secondary | ICD-10-CM | POA: Diagnosis not present

## 2023-04-12 DIAGNOSIS — D631 Anemia in chronic kidney disease: Secondary | ICD-10-CM | POA: Diagnosis not present

## 2023-04-12 DIAGNOSIS — D509 Iron deficiency anemia, unspecified: Secondary | ICD-10-CM | POA: Diagnosis not present

## 2023-04-14 DIAGNOSIS — N186 End stage renal disease: Secondary | ICD-10-CM | POA: Diagnosis not present

## 2023-04-14 DIAGNOSIS — N250 Renal osteodystrophy: Secondary | ICD-10-CM | POA: Diagnosis not present

## 2023-04-14 DIAGNOSIS — D509 Iron deficiency anemia, unspecified: Secondary | ICD-10-CM | POA: Diagnosis not present

## 2023-04-14 DIAGNOSIS — Z992 Dependence on renal dialysis: Secondary | ICD-10-CM | POA: Diagnosis not present

## 2023-04-14 DIAGNOSIS — D631 Anemia in chronic kidney disease: Secondary | ICD-10-CM | POA: Diagnosis not present

## 2023-04-16 DIAGNOSIS — Z992 Dependence on renal dialysis: Secondary | ICD-10-CM | POA: Diagnosis not present

## 2023-04-16 DIAGNOSIS — N186 End stage renal disease: Secondary | ICD-10-CM | POA: Diagnosis not present

## 2023-04-17 DIAGNOSIS — D631 Anemia in chronic kidney disease: Secondary | ICD-10-CM | POA: Diagnosis not present

## 2023-04-17 DIAGNOSIS — N250 Renal osteodystrophy: Secondary | ICD-10-CM | POA: Diagnosis not present

## 2023-04-17 DIAGNOSIS — Z992 Dependence on renal dialysis: Secondary | ICD-10-CM | POA: Diagnosis not present

## 2023-04-17 DIAGNOSIS — Z23 Encounter for immunization: Secondary | ICD-10-CM | POA: Diagnosis not present

## 2023-04-17 DIAGNOSIS — N186 End stage renal disease: Secondary | ICD-10-CM | POA: Diagnosis not present

## 2023-04-17 DIAGNOSIS — D509 Iron deficiency anemia, unspecified: Secondary | ICD-10-CM | POA: Diagnosis not present

## 2023-04-19 DIAGNOSIS — D631 Anemia in chronic kidney disease: Secondary | ICD-10-CM | POA: Diagnosis not present

## 2023-04-19 DIAGNOSIS — D509 Iron deficiency anemia, unspecified: Secondary | ICD-10-CM | POA: Diagnosis not present

## 2023-04-19 DIAGNOSIS — N250 Renal osteodystrophy: Secondary | ICD-10-CM | POA: Diagnosis not present

## 2023-04-19 DIAGNOSIS — Z23 Encounter for immunization: Secondary | ICD-10-CM | POA: Diagnosis not present

## 2023-04-19 DIAGNOSIS — N186 End stage renal disease: Secondary | ICD-10-CM | POA: Diagnosis not present

## 2023-04-19 DIAGNOSIS — Z992 Dependence on renal dialysis: Secondary | ICD-10-CM | POA: Diagnosis not present

## 2023-04-21 DIAGNOSIS — N250 Renal osteodystrophy: Secondary | ICD-10-CM | POA: Diagnosis not present

## 2023-04-21 DIAGNOSIS — Z23 Encounter for immunization: Secondary | ICD-10-CM | POA: Diagnosis not present

## 2023-04-21 DIAGNOSIS — D631 Anemia in chronic kidney disease: Secondary | ICD-10-CM | POA: Diagnosis not present

## 2023-04-21 DIAGNOSIS — D509 Iron deficiency anemia, unspecified: Secondary | ICD-10-CM | POA: Diagnosis not present

## 2023-04-21 DIAGNOSIS — N186 End stage renal disease: Secondary | ICD-10-CM | POA: Diagnosis not present

## 2023-04-21 DIAGNOSIS — Z992 Dependence on renal dialysis: Secondary | ICD-10-CM | POA: Diagnosis not present

## 2023-04-24 DIAGNOSIS — N250 Renal osteodystrophy: Secondary | ICD-10-CM | POA: Diagnosis not present

## 2023-04-24 DIAGNOSIS — D509 Iron deficiency anemia, unspecified: Secondary | ICD-10-CM | POA: Diagnosis not present

## 2023-04-24 DIAGNOSIS — Z992 Dependence on renal dialysis: Secondary | ICD-10-CM | POA: Diagnosis not present

## 2023-04-24 DIAGNOSIS — Z23 Encounter for immunization: Secondary | ICD-10-CM | POA: Diagnosis not present

## 2023-04-24 DIAGNOSIS — N186 End stage renal disease: Secondary | ICD-10-CM | POA: Diagnosis not present

## 2023-04-24 DIAGNOSIS — D631 Anemia in chronic kidney disease: Secondary | ICD-10-CM | POA: Diagnosis not present

## 2023-04-26 DIAGNOSIS — N186 End stage renal disease: Secondary | ICD-10-CM | POA: Diagnosis not present

## 2023-04-26 DIAGNOSIS — D509 Iron deficiency anemia, unspecified: Secondary | ICD-10-CM | POA: Diagnosis not present

## 2023-04-26 DIAGNOSIS — N250 Renal osteodystrophy: Secondary | ICD-10-CM | POA: Diagnosis not present

## 2023-04-26 DIAGNOSIS — D631 Anemia in chronic kidney disease: Secondary | ICD-10-CM | POA: Diagnosis not present

## 2023-04-26 DIAGNOSIS — Z992 Dependence on renal dialysis: Secondary | ICD-10-CM | POA: Diagnosis not present

## 2023-04-26 DIAGNOSIS — Z23 Encounter for immunization: Secondary | ICD-10-CM | POA: Diagnosis not present

## 2023-04-28 DIAGNOSIS — D631 Anemia in chronic kidney disease: Secondary | ICD-10-CM | POA: Diagnosis not present

## 2023-04-28 DIAGNOSIS — N186 End stage renal disease: Secondary | ICD-10-CM | POA: Diagnosis not present

## 2023-04-28 DIAGNOSIS — N250 Renal osteodystrophy: Secondary | ICD-10-CM | POA: Diagnosis not present

## 2023-04-28 DIAGNOSIS — Z992 Dependence on renal dialysis: Secondary | ICD-10-CM | POA: Diagnosis not present

## 2023-04-28 DIAGNOSIS — D509 Iron deficiency anemia, unspecified: Secondary | ICD-10-CM | POA: Diagnosis not present

## 2023-04-28 DIAGNOSIS — Z23 Encounter for immunization: Secondary | ICD-10-CM | POA: Diagnosis not present

## 2023-04-30 ENCOUNTER — Inpatient Hospital Stay: Payer: Medicare Other | Attending: Hematology

## 2023-04-30 DIAGNOSIS — C7A00 Malignant carcinoid tumor of unspecified site: Secondary | ICD-10-CM | POA: Insufficient documentation

## 2023-04-30 DIAGNOSIS — N186 End stage renal disease: Secondary | ICD-10-CM | POA: Insufficient documentation

## 2023-04-30 DIAGNOSIS — Z79899 Other long term (current) drug therapy: Secondary | ICD-10-CM | POA: Diagnosis not present

## 2023-04-30 DIAGNOSIS — Z992 Dependence on renal dialysis: Secondary | ICD-10-CM | POA: Insufficient documentation

## 2023-04-30 DIAGNOSIS — C7B02 Secondary carcinoid tumors of liver: Secondary | ICD-10-CM | POA: Insufficient documentation

## 2023-04-30 DIAGNOSIS — D631 Anemia in chronic kidney disease: Secondary | ICD-10-CM | POA: Diagnosis not present

## 2023-04-30 LAB — COMPREHENSIVE METABOLIC PANEL
ALT: 15 U/L (ref 0–44)
AST: 20 U/L (ref 15–41)
Albumin: 3.4 g/dL — ABNORMAL LOW (ref 3.5–5.0)
Alkaline Phosphatase: 112 U/L (ref 38–126)
Anion gap: 9 (ref 5–15)
BUN: 37 mg/dL — ABNORMAL HIGH (ref 8–23)
CO2: 29 mmol/L (ref 22–32)
Calcium: 8.7 mg/dL — ABNORMAL LOW (ref 8.9–10.3)
Chloride: 98 mmol/L (ref 98–111)
Creatinine, Ser: 6.25 mg/dL — ABNORMAL HIGH (ref 0.61–1.24)
GFR, Estimated: 8 mL/min — ABNORMAL LOW (ref >60)
Glucose, Bld: 87 mg/dL (ref 70–99)
Potassium: 3.7 mmol/L (ref 3.5–5.1)
Sodium: 136 mmol/L (ref 135–145)
Total Bilirubin: 0.7 mg/dL (ref 0.3–1.2)
Total Protein: 6.4 g/dL — ABNORMAL LOW (ref 6.5–8.1)

## 2023-04-30 LAB — CBC WITH DIFFERENTIAL/PLATELET
Abs Immature Granulocytes: 0.02 10*3/uL (ref 0.00–0.07)
Basophils Absolute: 0.1 10*3/uL (ref 0.0–0.1)
Basophils Relative: 1 %
Eosinophils Absolute: 0.9 10*3/uL — ABNORMAL HIGH (ref 0.0–0.5)
Eosinophils Relative: 16 %
HCT: 35.4 % — ABNORMAL LOW (ref 39.0–52.0)
Hemoglobin: 11.3 g/dL — ABNORMAL LOW (ref 13.0–17.0)
Immature Granulocytes: 0 %
Lymphocytes Relative: 10 %
Lymphs Abs: 0.6 10*3/uL — ABNORMAL LOW (ref 0.7–4.0)
MCH: 31.3 pg (ref 26.0–34.0)
MCHC: 31.9 g/dL (ref 30.0–36.0)
MCV: 98.1 fL (ref 80.0–100.0)
Monocytes Absolute: 0.5 10*3/uL (ref 0.1–1.0)
Monocytes Relative: 8 %
Neutro Abs: 3.7 10*3/uL (ref 1.7–7.7)
Neutrophils Relative %: 65 %
Platelets: 124 10*3/uL — ABNORMAL LOW (ref 150–400)
RBC: 3.61 MIL/uL — ABNORMAL LOW (ref 4.22–5.81)
RDW: 13.9 % (ref 11.5–15.5)
WBC: 5.7 10*3/uL (ref 4.0–10.5)
nRBC: 0 % (ref 0.0–0.2)

## 2023-05-01 DIAGNOSIS — N250 Renal osteodystrophy: Secondary | ICD-10-CM | POA: Diagnosis not present

## 2023-05-01 DIAGNOSIS — D509 Iron deficiency anemia, unspecified: Secondary | ICD-10-CM | POA: Diagnosis not present

## 2023-05-01 DIAGNOSIS — Z992 Dependence on renal dialysis: Secondary | ICD-10-CM | POA: Diagnosis not present

## 2023-05-01 DIAGNOSIS — N186 End stage renal disease: Secondary | ICD-10-CM | POA: Diagnosis not present

## 2023-05-01 DIAGNOSIS — D631 Anemia in chronic kidney disease: Secondary | ICD-10-CM | POA: Diagnosis not present

## 2023-05-01 DIAGNOSIS — Z23 Encounter for immunization: Secondary | ICD-10-CM | POA: Diagnosis not present

## 2023-05-01 LAB — CHROMOGRANIN A: Chromogranin A (ng/mL): 1626 ng/mL — ABNORMAL HIGH (ref 0.0–101.8)

## 2023-05-03 DIAGNOSIS — D509 Iron deficiency anemia, unspecified: Secondary | ICD-10-CM | POA: Diagnosis not present

## 2023-05-03 DIAGNOSIS — Z992 Dependence on renal dialysis: Secondary | ICD-10-CM | POA: Diagnosis not present

## 2023-05-03 DIAGNOSIS — N186 End stage renal disease: Secondary | ICD-10-CM | POA: Diagnosis not present

## 2023-05-03 DIAGNOSIS — D631 Anemia in chronic kidney disease: Secondary | ICD-10-CM | POA: Diagnosis not present

## 2023-05-03 DIAGNOSIS — Z23 Encounter for immunization: Secondary | ICD-10-CM | POA: Diagnosis not present

## 2023-05-03 DIAGNOSIS — N250 Renal osteodystrophy: Secondary | ICD-10-CM | POA: Diagnosis not present

## 2023-05-05 DIAGNOSIS — D631 Anemia in chronic kidney disease: Secondary | ICD-10-CM | POA: Diagnosis not present

## 2023-05-05 DIAGNOSIS — N186 End stage renal disease: Secondary | ICD-10-CM | POA: Diagnosis not present

## 2023-05-05 DIAGNOSIS — Z992 Dependence on renal dialysis: Secondary | ICD-10-CM | POA: Diagnosis not present

## 2023-05-05 DIAGNOSIS — N250 Renal osteodystrophy: Secondary | ICD-10-CM | POA: Diagnosis not present

## 2023-05-05 DIAGNOSIS — Z23 Encounter for immunization: Secondary | ICD-10-CM | POA: Diagnosis not present

## 2023-05-05 DIAGNOSIS — D509 Iron deficiency anemia, unspecified: Secondary | ICD-10-CM | POA: Diagnosis not present

## 2023-05-07 ENCOUNTER — Other Ambulatory Visit: Payer: Self-pay

## 2023-05-07 ENCOUNTER — Encounter: Payer: Self-pay | Admitting: Hematology

## 2023-05-07 ENCOUNTER — Inpatient Hospital Stay (HOSPITAL_BASED_OUTPATIENT_CLINIC_OR_DEPARTMENT_OTHER): Payer: Medicare Other | Admitting: Hematology

## 2023-05-07 ENCOUNTER — Other Ambulatory Visit: Payer: Medicare Other

## 2023-05-07 VITALS — BP 129/76 | HR 50 | Temp 97.6°F | Resp 18 | Wt 167.0 lb

## 2023-05-07 DIAGNOSIS — Z992 Dependence on renal dialysis: Secondary | ICD-10-CM | POA: Diagnosis not present

## 2023-05-07 DIAGNOSIS — C7A00 Malignant carcinoid tumor of unspecified site: Secondary | ICD-10-CM

## 2023-05-07 DIAGNOSIS — C7B02 Secondary carcinoid tumors of liver: Secondary | ICD-10-CM | POA: Diagnosis not present

## 2023-05-07 DIAGNOSIS — N186 End stage renal disease: Secondary | ICD-10-CM | POA: Diagnosis not present

## 2023-05-07 DIAGNOSIS — D631 Anemia in chronic kidney disease: Secondary | ICD-10-CM | POA: Diagnosis not present

## 2023-05-07 DIAGNOSIS — Z79899 Other long term (current) drug therapy: Secondary | ICD-10-CM | POA: Diagnosis not present

## 2023-05-07 NOTE — Patient Instructions (Addendum)
[facility] at [facility] Discharge Instructions   You were seen and examined today by Dr. Ellin Saba.  He reviewed the results of your lab work which are normal/stable.   We will see you back in 3 months. We will repeat lab work and a PET scan prior to this visit.   Return as scheduled.    Thank you for choosing [facility] at [facility] to provide your oncology and hematology care.  To afford each patient quality time with our provider, please arrive at least 15 minutes before your scheduled appointment time.   If you have a lab appointment with the Cancer Center please come in thru the Main Entrance and check in at the main information desk.  You need to re-schedule your appointment should you arrive 10 or more minutes late.  We strive to give you quality time with our providers, and arriving late affects you and other patients whose appointments are after yours.  Also, if you no show three or more times for appointments you may be dismissed from the clinic at the providers discretion.     Again, thank you for choosing [facility].  Our hope is that these requests will decrease the amount of time that you wait before being seen by our physicians.       _____________________________________________________________  Should you have questions after your visit to [facility], please contact our office at [phone] and follow the prompts.  Our office hours are 8:00 a.m. and 4:30 p.m. Monday - Friday.  Please note that voicemails left after 4:00 p.m. may not be returned until the following business day.  We are closed weekends and major holidays.  You do have access to a nurse 24-7, just call the main number to the clinic [phone] and do not press any options, hold on the line and a nurse will answer the phone.    For prescription refill requests, have your pharmacy contact our office and allow 72 hours.    Due to  Covid, you will need to wear a mask upon entering the hospital. If you do not have a mask, a mask will be given to you at the Main Entrance upon arrival. For doctor visits, patients may have 1 support person age 16 or older with them. For treatment visits, patients can not have anyone with them due to social distancing guidelines and our immunocompromised population.

## 2023-05-07 NOTE — Progress Notes (Signed)
[facility] [address] [place] [address]    Clinic Day:  05/07/2023  Referring physician: Toma Deiters, MD  Patient Care Team: Toma Deiters, MD as PCP - General (Internal Medicine) Doreatha Massed, MD as Medical Oncologist (Medical Oncology)   ASSESSMENT & PLAN:   Assessment: 1.  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  Liver biopsy-well-differentiated neuroendocrine tumor. -Sandostatin started back about 5 years ago.  Everolimus 5 mg daily started on 05/17/2018.  Everolimus discontinued on 02/01/2022. - PET scan (01/27/2022): Clear progression of hepatic metastatic tumor with a large central left hepatic lesion.  Multiple additional smaller metastatic lesions are increased in activity.  Stable multifocal peritoneal and mesenteric metastasis with some increase in radiotracer activity.  No new metastatic disease. - Umbilical nodule biopsy (02/10/2022): Metastatic neuroendocrine tumor - NGS test: TMB-low, MSI-stable, no other targetable mutations.  - Dotatate PET scan (06/14/2022): Progression of well-differentiated neuroendocrine tumor within the liver with dominant lesion in the central left hepatic lobe increased by 1 cm.  Other lesions have increased SUV.  Central mesenteric calcified mass is stable.  New peritoneal nodule superior left of the central mesenteric nodule.  New peritoneal nodule in the left ventral peritoneal space.  Lesion of the umbilicus with SUV 20, previously 26.  Implants in the deep peritoneal space stable.  Single focus of uptake in the medial left clavicle with SUV 5.7. - Other options including Temodar and PRRT have not been studied in dialysis patients.  I have reached out to [facility] and they do not have any trials open.  Capecitabine can be given with dose reduction in dialysis patients. - We tried to get him on lanreotide  with pegylated interferon.  He could not afford the high co-pay. - Lanreotide every 2 weeks started on 07/12/2022, discontinued on 01/03/2023 due to progression.   2.  Normocytic anemia: -This is from combination of end-stage renal disease and relative iron deficiency and myelosuppression from everolimus.   3.  ESRD on HD: -Started on HD on 05/20/2019    Plan: 1.  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  Multifocal peritoneal metastasis.  Mesenteric nodule measures 4.9 x 2.2 cm. - He does not report any abdominal pain.  No signs or symptoms of carcinoid syndrome. - Reviewed labs from 04/30/2023: Normal LFTs.  CBC grossly normal.  Chromogranin is stable around 1626. - He is not on any active therapy at this time.  RTC 3 months for follow-up with repeat PET scan and labs.   2.  Normocytic anemia: - Continue Retacrit with hemodialysis.  Hemoglobin today is 11.3.   3.  ESRD on HD: - Continue HD on Tuesday, Thursday and Saturday.    No orders of the defined types were placed in this encounter.     Doreatha Massed, MD   10/21/202411:44 AM  CHIEF COMPLAINT:   Diagnosis: metastatic carcinoid tumor of small intestine    Cancer Staging  No matching staging information was found for the patient.    Prior Therapy: Sandostatin monthly and everolimus discontinued on 02/01/2022   Current Therapy:  Lanreotide every 2 weeks    HISTORY OF PRESENT ILLNESS:   Oncology History   No history exists.     INTERVAL HISTORY:   Allen Davenport is a 82 y.o. male seen for follow-up of metastatic neuroendocrine tumor.  Appetite is 100% and energy levels of 75%.  Denies any abdominal pains/diarrhea.  PAST MEDICAL HISTORY:   Past Medical History: Past Medical History:  Diagnosis Date   Anemia    Cancer (HCC)    right renal . Prostate- Radiation treatment   CHF (congestive heart failure)  (HCC)    Chronic kidney disease    Dialysis T/TH/Sa   Edema    Heart murmur    "nothing to worry about"   Hyperlipidemia    Hypertension    Malignant carcinoid tumor (HCC) 01/03/2016   Pneumonia    as a child    Surgical History: Past Surgical History:  Procedure Laterality Date   AV FISTULA PLACEMENT Left 10/20/2014   Procedure: Left Arm ARTERIOVENOUS (AV) FISTULA CREATION;  Surgeon: Sherren Kerns, MD;  Location: [facility] OR;  Service: Vascular;  Laterality: Left;   NEPHRECTOMY Right 2010   PERIPHERAL VASCULAR BALLOON ANGIOPLASTY  06/20/2019   Procedure: PERIPHERAL VASCULAR BALLOON ANGIOPLASTY;  Surgeon: Sherren Kerns, MD;  Location: [facility] INVASIVE CV LAB;  Service: Cardiovascular;;   REVISON OF ARTERIOVENOUS FISTULA Left 08/04/2019   Procedure: REVISON OF ARTERIOVENOUS FISTULA WITH SIDE BRANCH LIGATION;  Surgeon: Sherren Kerns, MD;  Location: [facility] OR;  Service: Vascular;  Laterality: Left;    Social History: Social History   Socioeconomic History   Marital status: Married    Spouse name: Not on file   Number of children: Not on file   Years of education: Not on file   Highest education level: Not on file  Occupational History   Not on file  Tobacco Use   Smoking status: Never   Smokeless tobacco: Never  Vaping Use   Vaping status: Never Used  Substance and Sexual Activity   Alcohol use: No    Alcohol/week: 0.0 standard drinks of alcohol   Drug use: No   Sexual activity: Not on file  Other Topics Concern   Not on file  Social History Narrative   Not on file   Social Determinants of Health   Financial Resource Strain: Low Risk  (05/24/2020)   Overall Financial Resource Strain (CARDIA)    Difficulty of Paying Living Expenses: Not hard at all  Food Insecurity: No Food Insecurity (05/24/2020)   Hunger Vital Sign    Worried About Running Out of Food in the Last Year: Never true    Ran Out of Food in the Last Year: Never true  Transportation Needs: No Transportation  Needs (05/24/2020)   PRAPARE - Administrator, Civil Service (Medical): No    Lack of Transportation (Non-Medical): No  Physical Activity: Insufficiently Active (05/24/2020)   Exercise Vital Sign    Days of Exercise per Week: 7 days    Minutes of Exercise per Session: 10 min  Stress: No Stress Concern Present (05/24/2020)   [employer] of Occupational Health - Occupational Stress Questionnaire    Feeling of Stress : Not at all  Social Connections: Moderately Integrated (05/24/2020)   Social Connection and Isolation Panel [NHANES]    Frequency of Communication with Friends and Family: More than three times a week    Frequency of Social Gatherings with Friends and Family: Once a week    Attends Religious Services: More than 4 times per year    Active Member of [employer] or Organizations: No    Attends Banker Meetings: Never    Marital Status: Married  Catering manager Violence: Not At Risk (05/24/2020)   Humiliation, Afraid, Rape,  and Kick questionnaire    Fear of Current or Ex-Partner: No    Emotionally Abused: No    Physically Abused: No    Sexually Abused: No    Family History: Family History  Problem Relation Age of Onset   Cancer Mother    Hypertension Father    Cancer Sister     Current Medications:  Current Outpatient Medications:    amLODipine (NORVASC) 10 MG tablet, Take 1 tablet every day by oral route., Disp: , Rfl:    atorvastatin (LIPITOR) 80 MG tablet, Take 80 mg by mouth daily at 6 PM. , Disp: , Rfl:    calcium acetate (PHOSLO) 667 MG capsule, Take by mouth., Disp: , Rfl:    Calcium Acetate 667 MG TABS, Take 667-2,001 mg by mouth See admin instructions. Take 2001 mg with each meal and 667 mg with each snack, Disp: , Rfl:    Calcium Carb-Cholecalciferol (CALCIUM 500 + D PO), Take 1 tablet by mouth in the morning, at noon, and at bedtime., Disp: , Rfl:    carvedilol (COREG) 6.25 MG tablet, Take 6.25 mg by mouth 2 (two) times daily., Disp: ,  Rfl:    fexofenadine (ALLEGRA) 180 MG tablet, Take 180 mg by mouth daily., Disp: , Rfl:    furosemide (LASIX) 40 MG tablet, Take 40 mg by mouth every morning., Disp: , Rfl:    mirtazapine (REMERON) 7.5 MG tablet, Take 7.5 mg by mouth at bedtime., Disp: , Rfl:    Omega-3 Fatty Acids (FISH OIL) 1000 MG CAPS, Take 1,000 mg by mouth daily. , Disp: , Rfl:    Riboflavin (B2) 100 MG TABS, Take 100 mg by mouth daily., Disp: , Rfl:    sodium bicarbonate 650 MG tablet, Take 650 mg by mouth 3 (three) times daily., Disp: , Rfl:    sodium bicarbonate 650 MG tablet, Take 1 tablet by mouth 3 (three) times daily., Disp: , Rfl:    peginterferon alfa-2a (PEGASYS) 180 MCG/ML injection, Inject 0.75 mLs (135 mcg total) into the skin every 14 (fourteen) days. (Patient not taking: Reported on 05/07/2023), Disp: 2 mL, Rfl: 1   TUBERCULIN SYR 1CC/27GX1/2" (B-D TB SYRINGE 1CC/27GX1/2") 27G X 1/2" 1 ML MISC, Use to administer pegasys every 14 days (Patient not taking: Reported on 05/07/2023), Disp: 25 each, Rfl: 0   Allergies: No Known Allergies  REVIEW OF SYSTEMS:   Review of Systems  Constitutional:  Negative for chills, fatigue and fever.  HENT:   Negative for lump/mass, mouth sores, nosebleeds, sore throat and trouble swallowing.   Eyes:  Negative for eye problems.  Respiratory:  Negative for cough and shortness of breath.   Cardiovascular:  Negative for chest pain, leg swelling and palpitations.  Gastrointestinal:  Negative for abdominal pain, constipation, diarrhea, nausea and vomiting.  Genitourinary:  Negative for bladder incontinence, difficulty urinating, dysuria, frequency, hematuria and nocturia.   Musculoskeletal:  Negative for arthralgias, back pain, flank pain, myalgias and neck pain.  Skin:  Negative for itching and rash.  Neurological:  Negative for dizziness, headaches and numbness.  Hematological:  Does not bruise/bleed easily.  Psychiatric/Behavioral:  Negative for depression, sleep disturbance  and suicidal ideas. The patient is not nervous/anxious.   All other systems reviewed and are negative.    VITALS:   Blood pressure 129/76, pulse (!) 50, temperature 97.6 F (36.4 C), temperature source Oral, resp. rate 18, weight 167 lb (75.8 kg), SpO2 90%.  Wt Readings from Last 3 Encounters:  05/07/23 167 lb (75.8 kg)  01/03/23 161 lb 11.2 oz (73.3 kg)  11/15/22 160 lb 6.4 oz (72.8 kg)    Body mass index is 19.8 kg/m.  Performance status (ECOG): 1 - Symptomatic but completely ambulatory  PHYSICAL EXAM:   Physical Exam Vitals and nursing note reviewed. Exam conducted with a chaperone present.  Constitutional:      Appearance: Normal appearance.  Cardiovascular:     Rate and Rhythm: Normal rate and regular rhythm.     Pulses: Normal pulses.     Heart sounds: Normal heart sounds.  Pulmonary:     Effort: Pulmonary effort is normal.     Breath sounds: Normal breath sounds.  Abdominal:     Palpations: Abdomen is soft. There is no hepatomegaly, splenomegaly or mass.     Tenderness: There is no abdominal tenderness.  Musculoskeletal:     Right lower leg: No edema.     Left lower leg: No edema.  Lymphadenopathy:     Cervical: No cervical adenopathy.     Right cervical: No superficial, deep or posterior cervical adenopathy.    Left cervical: No superficial, deep or posterior cervical adenopathy.     Upper Body:     Right upper body: No supraclavicular or axillary adenopathy.     Left upper body: No supraclavicular or axillary adenopathy.  Neurological:     General: No focal deficit present.     Mental Status: He is alert and oriented to person, place, and time.  Psychiatric:        Mood and Affect: Mood normal.        Behavior: Behavior normal.     LABS:      Latest Ref Rng & Units 04/30/2023   10:44 AM 12/25/2022   10:11 AM 11/01/2022   10:16 AM  CBC  WBC 4.0 - 10.5 K/uL 5.7  5.6  4.1   Hemoglobin 13.0 - 17.0 g/dL 16.1  09.6  04.5   Hematocrit 39.0 - 52.0 % 35.4   33.9  34.8   Platelets 150 - 400 K/uL 124  121  106       Latest Ref Rng & Units 04/30/2023   10:44 AM 12/25/2022   10:11 AM 11/01/2022   10:16 AM  CMP  Glucose 70 - 99 mg/dL 87  82  409   BUN 8 - 23 mg/dL 37  32  24   Creatinine 0.61 - 1.24 mg/dL 8.11  9.14  7.82   Sodium 135 - 145 mmol/L 136  136  135   Potassium 3.5 - 5.1 mmol/L 3.7  3.9  4.2   Chloride 98 - 111 mmol/L 98  96  97   CO2 22 - 32 mmol/L 29  32  29   Calcium 8.9 - 10.3 mg/dL 8.7  8.7  8.4   Total Protein 6.5 - 8.1 g/dL 6.4  5.8  5.8   Total Bilirubin 0.3 - 1.2 mg/dL 0.7  0.9  0.9   Alkaline Phos 38 - 126 U/L 112  78  83   AST 15 - 41 U/L 20  20  21    ALT 0 - 44 U/L 15  11  13       No results found for: "CEA1", "CEA" / No results found for: "CEA1", "CEA" No results found for: "PSA1" No results found for: "NFA213" No results found for: "CAN125"  No results found for: "TOTALPROTELP", "ALBUMINELP", "A1GS", "A2GS", "BETS", "BETA2SER", "GAMS", "MSPIKE", "SPEI" Lab Results  Component Value Date   TIBC 226 (L) 08/06/2019  TIBC 229 (L) 06/30/2019   TIBC 175 (L) 06/06/2019   FERRITIN 454 (H) 08/06/2019   FERRITIN 466 (H) 06/30/2019   FERRITIN 767 (H) 06/06/2019   IRONPCTSAT 50 (H) 08/06/2019   IRONPCTSAT 34 06/30/2019   IRONPCTSAT 9 (L) 06/06/2019   Lab Results  Component Value Date   LDH 131 06/12/2022   LDH 163 09/12/2021   LDH 153 07/20/2021     STUDIES:   No results found.

## 2023-05-08 DIAGNOSIS — Z992 Dependence on renal dialysis: Secondary | ICD-10-CM | POA: Diagnosis not present

## 2023-05-08 DIAGNOSIS — D631 Anemia in chronic kidney disease: Secondary | ICD-10-CM | POA: Diagnosis not present

## 2023-05-08 DIAGNOSIS — D509 Iron deficiency anemia, unspecified: Secondary | ICD-10-CM | POA: Diagnosis not present

## 2023-05-08 DIAGNOSIS — Z23 Encounter for immunization: Secondary | ICD-10-CM | POA: Diagnosis not present

## 2023-05-08 DIAGNOSIS — N250 Renal osteodystrophy: Secondary | ICD-10-CM | POA: Diagnosis not present

## 2023-05-08 DIAGNOSIS — N186 End stage renal disease: Secondary | ICD-10-CM | POA: Diagnosis not present

## 2023-05-10 DIAGNOSIS — D631 Anemia in chronic kidney disease: Secondary | ICD-10-CM | POA: Diagnosis not present

## 2023-05-10 DIAGNOSIS — Z23 Encounter for immunization: Secondary | ICD-10-CM | POA: Diagnosis not present

## 2023-05-10 DIAGNOSIS — Z992 Dependence on renal dialysis: Secondary | ICD-10-CM | POA: Diagnosis not present

## 2023-05-10 DIAGNOSIS — D509 Iron deficiency anemia, unspecified: Secondary | ICD-10-CM | POA: Diagnosis not present

## 2023-05-10 DIAGNOSIS — N250 Renal osteodystrophy: Secondary | ICD-10-CM | POA: Diagnosis not present

## 2023-05-10 DIAGNOSIS — N186 End stage renal disease: Secondary | ICD-10-CM | POA: Diagnosis not present

## 2023-05-12 DIAGNOSIS — N250 Renal osteodystrophy: Secondary | ICD-10-CM | POA: Diagnosis not present

## 2023-05-12 DIAGNOSIS — Z992 Dependence on renal dialysis: Secondary | ICD-10-CM | POA: Diagnosis not present

## 2023-05-12 DIAGNOSIS — D509 Iron deficiency anemia, unspecified: Secondary | ICD-10-CM | POA: Diagnosis not present

## 2023-05-12 DIAGNOSIS — D631 Anemia in chronic kidney disease: Secondary | ICD-10-CM | POA: Diagnosis not present

## 2023-05-12 DIAGNOSIS — Z23 Encounter for immunization: Secondary | ICD-10-CM | POA: Diagnosis not present

## 2023-05-12 DIAGNOSIS — N186 End stage renal disease: Secondary | ICD-10-CM | POA: Diagnosis not present

## 2023-05-15 DIAGNOSIS — Z992 Dependence on renal dialysis: Secondary | ICD-10-CM | POA: Diagnosis not present

## 2023-05-15 DIAGNOSIS — Z23 Encounter for immunization: Secondary | ICD-10-CM | POA: Diagnosis not present

## 2023-05-15 DIAGNOSIS — N250 Renal osteodystrophy: Secondary | ICD-10-CM | POA: Diagnosis not present

## 2023-05-15 DIAGNOSIS — D631 Anemia in chronic kidney disease: Secondary | ICD-10-CM | POA: Diagnosis not present

## 2023-05-15 DIAGNOSIS — D509 Iron deficiency anemia, unspecified: Secondary | ICD-10-CM | POA: Diagnosis not present

## 2023-05-15 DIAGNOSIS — N186 End stage renal disease: Secondary | ICD-10-CM | POA: Diagnosis not present

## 2023-05-17 DIAGNOSIS — Z992 Dependence on renal dialysis: Secondary | ICD-10-CM | POA: Diagnosis not present

## 2023-05-17 DIAGNOSIS — Z23 Encounter for immunization: Secondary | ICD-10-CM | POA: Diagnosis not present

## 2023-05-17 DIAGNOSIS — D631 Anemia in chronic kidney disease: Secondary | ICD-10-CM | POA: Diagnosis not present

## 2023-05-17 DIAGNOSIS — N186 End stage renal disease: Secondary | ICD-10-CM | POA: Diagnosis not present

## 2023-05-17 DIAGNOSIS — N250 Renal osteodystrophy: Secondary | ICD-10-CM | POA: Diagnosis not present

## 2023-05-17 DIAGNOSIS — D509 Iron deficiency anemia, unspecified: Secondary | ICD-10-CM | POA: Diagnosis not present

## 2023-05-19 DIAGNOSIS — N250 Renal osteodystrophy: Secondary | ICD-10-CM | POA: Diagnosis not present

## 2023-05-19 DIAGNOSIS — D631 Anemia in chronic kidney disease: Secondary | ICD-10-CM | POA: Diagnosis not present

## 2023-05-19 DIAGNOSIS — D509 Iron deficiency anemia, unspecified: Secondary | ICD-10-CM | POA: Diagnosis not present

## 2023-05-19 DIAGNOSIS — Z992 Dependence on renal dialysis: Secondary | ICD-10-CM | POA: Diagnosis not present

## 2023-05-19 DIAGNOSIS — N186 End stage renal disease: Secondary | ICD-10-CM | POA: Diagnosis not present

## 2023-05-22 DIAGNOSIS — N250 Renal osteodystrophy: Secondary | ICD-10-CM | POA: Diagnosis not present

## 2023-05-22 DIAGNOSIS — D631 Anemia in chronic kidney disease: Secondary | ICD-10-CM | POA: Diagnosis not present

## 2023-05-22 DIAGNOSIS — Z992 Dependence on renal dialysis: Secondary | ICD-10-CM | POA: Diagnosis not present

## 2023-05-22 DIAGNOSIS — N186 End stage renal disease: Secondary | ICD-10-CM | POA: Diagnosis not present

## 2023-05-22 DIAGNOSIS — D509 Iron deficiency anemia, unspecified: Secondary | ICD-10-CM | POA: Diagnosis not present

## 2023-05-24 DIAGNOSIS — N250 Renal osteodystrophy: Secondary | ICD-10-CM | POA: Diagnosis not present

## 2023-05-24 DIAGNOSIS — D631 Anemia in chronic kidney disease: Secondary | ICD-10-CM | POA: Diagnosis not present

## 2023-05-24 DIAGNOSIS — N186 End stage renal disease: Secondary | ICD-10-CM | POA: Diagnosis not present

## 2023-05-24 DIAGNOSIS — D509 Iron deficiency anemia, unspecified: Secondary | ICD-10-CM | POA: Diagnosis not present

## 2023-05-24 DIAGNOSIS — Z992 Dependence on renal dialysis: Secondary | ICD-10-CM | POA: Diagnosis not present

## 2023-05-26 DIAGNOSIS — D631 Anemia in chronic kidney disease: Secondary | ICD-10-CM | POA: Diagnosis not present

## 2023-05-26 DIAGNOSIS — D509 Iron deficiency anemia, unspecified: Secondary | ICD-10-CM | POA: Diagnosis not present

## 2023-05-26 DIAGNOSIS — Z992 Dependence on renal dialysis: Secondary | ICD-10-CM | POA: Diagnosis not present

## 2023-05-26 DIAGNOSIS — N186 End stage renal disease: Secondary | ICD-10-CM | POA: Diagnosis not present

## 2023-05-26 DIAGNOSIS — N250 Renal osteodystrophy: Secondary | ICD-10-CM | POA: Diagnosis not present

## 2023-05-29 DIAGNOSIS — D631 Anemia in chronic kidney disease: Secondary | ICD-10-CM | POA: Diagnosis not present

## 2023-05-29 DIAGNOSIS — N186 End stage renal disease: Secondary | ICD-10-CM | POA: Diagnosis not present

## 2023-05-29 DIAGNOSIS — N250 Renal osteodystrophy: Secondary | ICD-10-CM | POA: Diagnosis not present

## 2023-05-29 DIAGNOSIS — D509 Iron deficiency anemia, unspecified: Secondary | ICD-10-CM | POA: Diagnosis not present

## 2023-05-29 DIAGNOSIS — Z992 Dependence on renal dialysis: Secondary | ICD-10-CM | POA: Diagnosis not present

## 2023-05-30 DIAGNOSIS — M818 Other osteoporosis without current pathological fracture: Secondary | ICD-10-CM | POA: Diagnosis not present

## 2023-05-30 DIAGNOSIS — R001 Bradycardia, unspecified: Secondary | ICD-10-CM | POA: Diagnosis not present

## 2023-05-30 DIAGNOSIS — J302 Other seasonal allergic rhinitis: Secondary | ICD-10-CM | POA: Diagnosis not present

## 2023-05-30 DIAGNOSIS — E7849 Other hyperlipidemia: Secondary | ICD-10-CM | POA: Diagnosis not present

## 2023-05-30 DIAGNOSIS — I10 Essential (primary) hypertension: Secondary | ICD-10-CM | POA: Diagnosis not present

## 2023-05-30 DIAGNOSIS — N186 End stage renal disease: Secondary | ICD-10-CM | POA: Diagnosis not present

## 2023-05-31 DIAGNOSIS — D631 Anemia in chronic kidney disease: Secondary | ICD-10-CM | POA: Diagnosis not present

## 2023-05-31 DIAGNOSIS — D509 Iron deficiency anemia, unspecified: Secondary | ICD-10-CM | POA: Diagnosis not present

## 2023-05-31 DIAGNOSIS — Z992 Dependence on renal dialysis: Secondary | ICD-10-CM | POA: Diagnosis not present

## 2023-05-31 DIAGNOSIS — N186 End stage renal disease: Secondary | ICD-10-CM | POA: Diagnosis not present

## 2023-05-31 DIAGNOSIS — N250 Renal osteodystrophy: Secondary | ICD-10-CM | POA: Diagnosis not present

## 2023-06-02 DIAGNOSIS — D631 Anemia in chronic kidney disease: Secondary | ICD-10-CM | POA: Diagnosis not present

## 2023-06-02 DIAGNOSIS — N186 End stage renal disease: Secondary | ICD-10-CM | POA: Diagnosis not present

## 2023-06-02 DIAGNOSIS — D509 Iron deficiency anemia, unspecified: Secondary | ICD-10-CM | POA: Diagnosis not present

## 2023-06-02 DIAGNOSIS — N250 Renal osteodystrophy: Secondary | ICD-10-CM | POA: Diagnosis not present

## 2023-06-02 DIAGNOSIS — Z992 Dependence on renal dialysis: Secondary | ICD-10-CM | POA: Diagnosis not present

## 2023-06-04 DIAGNOSIS — M818 Other osteoporosis without current pathological fracture: Secondary | ICD-10-CM | POA: Diagnosis not present

## 2023-06-04 DIAGNOSIS — R001 Bradycardia, unspecified: Secondary | ICD-10-CM | POA: Diagnosis not present

## 2023-06-04 DIAGNOSIS — J302 Other seasonal allergic rhinitis: Secondary | ICD-10-CM | POA: Diagnosis not present

## 2023-06-04 DIAGNOSIS — I10 Essential (primary) hypertension: Secondary | ICD-10-CM | POA: Diagnosis not present

## 2023-06-04 DIAGNOSIS — E7849 Other hyperlipidemia: Secondary | ICD-10-CM | POA: Diagnosis not present

## 2023-06-04 DIAGNOSIS — N186 End stage renal disease: Secondary | ICD-10-CM | POA: Diagnosis not present

## 2023-06-05 DIAGNOSIS — D631 Anemia in chronic kidney disease: Secondary | ICD-10-CM | POA: Diagnosis not present

## 2023-06-05 DIAGNOSIS — N250 Renal osteodystrophy: Secondary | ICD-10-CM | POA: Diagnosis not present

## 2023-06-05 DIAGNOSIS — N186 End stage renal disease: Secondary | ICD-10-CM | POA: Diagnosis not present

## 2023-06-05 DIAGNOSIS — D509 Iron deficiency anemia, unspecified: Secondary | ICD-10-CM | POA: Diagnosis not present

## 2023-06-05 DIAGNOSIS — Z992 Dependence on renal dialysis: Secondary | ICD-10-CM | POA: Diagnosis not present

## 2023-06-07 DIAGNOSIS — D509 Iron deficiency anemia, unspecified: Secondary | ICD-10-CM | POA: Diagnosis not present

## 2023-06-07 DIAGNOSIS — N186 End stage renal disease: Secondary | ICD-10-CM | POA: Diagnosis not present

## 2023-06-07 DIAGNOSIS — Z992 Dependence on renal dialysis: Secondary | ICD-10-CM | POA: Diagnosis not present

## 2023-06-07 DIAGNOSIS — N250 Renal osteodystrophy: Secondary | ICD-10-CM | POA: Diagnosis not present

## 2023-06-07 DIAGNOSIS — D631 Anemia in chronic kidney disease: Secondary | ICD-10-CM | POA: Diagnosis not present

## 2023-06-09 DIAGNOSIS — N250 Renal osteodystrophy: Secondary | ICD-10-CM | POA: Diagnosis not present

## 2023-06-09 DIAGNOSIS — Z992 Dependence on renal dialysis: Secondary | ICD-10-CM | POA: Diagnosis not present

## 2023-06-09 DIAGNOSIS — D631 Anemia in chronic kidney disease: Secondary | ICD-10-CM | POA: Diagnosis not present

## 2023-06-09 DIAGNOSIS — N186 End stage renal disease: Secondary | ICD-10-CM | POA: Diagnosis not present

## 2023-06-09 DIAGNOSIS — D509 Iron deficiency anemia, unspecified: Secondary | ICD-10-CM | POA: Diagnosis not present

## 2023-06-12 DIAGNOSIS — Z992 Dependence on renal dialysis: Secondary | ICD-10-CM | POA: Diagnosis not present

## 2023-06-12 DIAGNOSIS — N186 End stage renal disease: Secondary | ICD-10-CM | POA: Diagnosis not present

## 2023-06-12 DIAGNOSIS — N250 Renal osteodystrophy: Secondary | ICD-10-CM | POA: Diagnosis not present

## 2023-06-12 DIAGNOSIS — D509 Iron deficiency anemia, unspecified: Secondary | ICD-10-CM | POA: Diagnosis not present

## 2023-06-12 DIAGNOSIS — D631 Anemia in chronic kidney disease: Secondary | ICD-10-CM | POA: Diagnosis not present

## 2023-06-14 DIAGNOSIS — N186 End stage renal disease: Secondary | ICD-10-CM | POA: Diagnosis not present

## 2023-06-14 DIAGNOSIS — D631 Anemia in chronic kidney disease: Secondary | ICD-10-CM | POA: Diagnosis not present

## 2023-06-14 DIAGNOSIS — D509 Iron deficiency anemia, unspecified: Secondary | ICD-10-CM | POA: Diagnosis not present

## 2023-06-14 DIAGNOSIS — N250 Renal osteodystrophy: Secondary | ICD-10-CM | POA: Diagnosis not present

## 2023-06-14 DIAGNOSIS — Z992 Dependence on renal dialysis: Secondary | ICD-10-CM | POA: Diagnosis not present

## 2023-06-16 DIAGNOSIS — N186 End stage renal disease: Secondary | ICD-10-CM | POA: Diagnosis not present

## 2023-06-16 DIAGNOSIS — D509 Iron deficiency anemia, unspecified: Secondary | ICD-10-CM | POA: Diagnosis not present

## 2023-06-16 DIAGNOSIS — N250 Renal osteodystrophy: Secondary | ICD-10-CM | POA: Diagnosis not present

## 2023-06-16 DIAGNOSIS — D631 Anemia in chronic kidney disease: Secondary | ICD-10-CM | POA: Diagnosis not present

## 2023-06-16 DIAGNOSIS — Z992 Dependence on renal dialysis: Secondary | ICD-10-CM | POA: Diagnosis not present

## 2023-06-18 DIAGNOSIS — E1140 Type 2 diabetes mellitus with diabetic neuropathy, unspecified: Secondary | ICD-10-CM | POA: Diagnosis not present

## 2023-06-18 DIAGNOSIS — E1151 Type 2 diabetes mellitus with diabetic peripheral angiopathy without gangrene: Secondary | ICD-10-CM | POA: Diagnosis not present

## 2023-06-19 DIAGNOSIS — N186 End stage renal disease: Secondary | ICD-10-CM | POA: Diagnosis not present

## 2023-06-19 DIAGNOSIS — D631 Anemia in chronic kidney disease: Secondary | ICD-10-CM | POA: Diagnosis not present

## 2023-06-19 DIAGNOSIS — Z992 Dependence on renal dialysis: Secondary | ICD-10-CM | POA: Diagnosis not present

## 2023-06-19 DIAGNOSIS — D509 Iron deficiency anemia, unspecified: Secondary | ICD-10-CM | POA: Diagnosis not present

## 2023-06-19 DIAGNOSIS — N250 Renal osteodystrophy: Secondary | ICD-10-CM | POA: Diagnosis not present

## 2023-06-21 DIAGNOSIS — N186 End stage renal disease: Secondary | ICD-10-CM | POA: Diagnosis not present

## 2023-06-21 DIAGNOSIS — N250 Renal osteodystrophy: Secondary | ICD-10-CM | POA: Diagnosis not present

## 2023-06-21 DIAGNOSIS — Z992 Dependence on renal dialysis: Secondary | ICD-10-CM | POA: Diagnosis not present

## 2023-06-21 DIAGNOSIS — D631 Anemia in chronic kidney disease: Secondary | ICD-10-CM | POA: Diagnosis not present

## 2023-06-21 DIAGNOSIS — D509 Iron deficiency anemia, unspecified: Secondary | ICD-10-CM | POA: Diagnosis not present

## 2023-06-23 DIAGNOSIS — D509 Iron deficiency anemia, unspecified: Secondary | ICD-10-CM | POA: Diagnosis not present

## 2023-06-23 DIAGNOSIS — D631 Anemia in chronic kidney disease: Secondary | ICD-10-CM | POA: Diagnosis not present

## 2023-06-23 DIAGNOSIS — Z992 Dependence on renal dialysis: Secondary | ICD-10-CM | POA: Diagnosis not present

## 2023-06-23 DIAGNOSIS — N186 End stage renal disease: Secondary | ICD-10-CM | POA: Diagnosis not present

## 2023-06-23 DIAGNOSIS — N250 Renal osteodystrophy: Secondary | ICD-10-CM | POA: Diagnosis not present

## 2023-06-26 DIAGNOSIS — D631 Anemia in chronic kidney disease: Secondary | ICD-10-CM | POA: Diagnosis not present

## 2023-06-26 DIAGNOSIS — N250 Renal osteodystrophy: Secondary | ICD-10-CM | POA: Diagnosis not present

## 2023-06-26 DIAGNOSIS — Z992 Dependence on renal dialysis: Secondary | ICD-10-CM | POA: Diagnosis not present

## 2023-06-26 DIAGNOSIS — N186 End stage renal disease: Secondary | ICD-10-CM | POA: Diagnosis not present

## 2023-06-26 DIAGNOSIS — D509 Iron deficiency anemia, unspecified: Secondary | ICD-10-CM | POA: Diagnosis not present

## 2023-06-28 DIAGNOSIS — Z992 Dependence on renal dialysis: Secondary | ICD-10-CM | POA: Diagnosis not present

## 2023-06-28 DIAGNOSIS — D509 Iron deficiency anemia, unspecified: Secondary | ICD-10-CM | POA: Diagnosis not present

## 2023-06-28 DIAGNOSIS — D631 Anemia in chronic kidney disease: Secondary | ICD-10-CM | POA: Diagnosis not present

## 2023-06-28 DIAGNOSIS — N186 End stage renal disease: Secondary | ICD-10-CM | POA: Diagnosis not present

## 2023-06-28 DIAGNOSIS — N250 Renal osteodystrophy: Secondary | ICD-10-CM | POA: Diagnosis not present

## 2023-06-29 DIAGNOSIS — H35033 Hypertensive retinopathy, bilateral: Secondary | ICD-10-CM | POA: Diagnosis not present

## 2023-06-30 DIAGNOSIS — N250 Renal osteodystrophy: Secondary | ICD-10-CM | POA: Diagnosis not present

## 2023-06-30 DIAGNOSIS — N186 End stage renal disease: Secondary | ICD-10-CM | POA: Diagnosis not present

## 2023-06-30 DIAGNOSIS — D509 Iron deficiency anemia, unspecified: Secondary | ICD-10-CM | POA: Diagnosis not present

## 2023-06-30 DIAGNOSIS — Z992 Dependence on renal dialysis: Secondary | ICD-10-CM | POA: Diagnosis not present

## 2023-06-30 DIAGNOSIS — D631 Anemia in chronic kidney disease: Secondary | ICD-10-CM | POA: Diagnosis not present

## 2023-07-03 DIAGNOSIS — Z992 Dependence on renal dialysis: Secondary | ICD-10-CM | POA: Diagnosis not present

## 2023-07-03 DIAGNOSIS — N186 End stage renal disease: Secondary | ICD-10-CM | POA: Diagnosis not present

## 2023-07-03 DIAGNOSIS — D509 Iron deficiency anemia, unspecified: Secondary | ICD-10-CM | POA: Diagnosis not present

## 2023-07-03 DIAGNOSIS — D631 Anemia in chronic kidney disease: Secondary | ICD-10-CM | POA: Diagnosis not present

## 2023-07-03 DIAGNOSIS — N250 Renal osteodystrophy: Secondary | ICD-10-CM | POA: Diagnosis not present

## 2023-07-05 DIAGNOSIS — Z992 Dependence on renal dialysis: Secondary | ICD-10-CM | POA: Diagnosis not present

## 2023-07-05 DIAGNOSIS — D509 Iron deficiency anemia, unspecified: Secondary | ICD-10-CM | POA: Diagnosis not present

## 2023-07-05 DIAGNOSIS — N250 Renal osteodystrophy: Secondary | ICD-10-CM | POA: Diagnosis not present

## 2023-07-05 DIAGNOSIS — N186 End stage renal disease: Secondary | ICD-10-CM | POA: Diagnosis not present

## 2023-07-05 DIAGNOSIS — D631 Anemia in chronic kidney disease: Secondary | ICD-10-CM | POA: Diagnosis not present

## 2023-07-07 DIAGNOSIS — D509 Iron deficiency anemia, unspecified: Secondary | ICD-10-CM | POA: Diagnosis not present

## 2023-07-07 DIAGNOSIS — Z992 Dependence on renal dialysis: Secondary | ICD-10-CM | POA: Diagnosis not present

## 2023-07-07 DIAGNOSIS — N186 End stage renal disease: Secondary | ICD-10-CM | POA: Diagnosis not present

## 2023-07-07 DIAGNOSIS — D631 Anemia in chronic kidney disease: Secondary | ICD-10-CM | POA: Diagnosis not present

## 2023-07-07 DIAGNOSIS — N250 Renal osteodystrophy: Secondary | ICD-10-CM | POA: Diagnosis not present

## 2023-07-09 DIAGNOSIS — D509 Iron deficiency anemia, unspecified: Secondary | ICD-10-CM | POA: Diagnosis not present

## 2023-07-09 DIAGNOSIS — N250 Renal osteodystrophy: Secondary | ICD-10-CM | POA: Diagnosis not present

## 2023-07-09 DIAGNOSIS — D631 Anemia in chronic kidney disease: Secondary | ICD-10-CM | POA: Diagnosis not present

## 2023-07-09 DIAGNOSIS — Z992 Dependence on renal dialysis: Secondary | ICD-10-CM | POA: Diagnosis not present

## 2023-07-09 DIAGNOSIS — N186 End stage renal disease: Secondary | ICD-10-CM | POA: Diagnosis not present

## 2023-07-12 DIAGNOSIS — D509 Iron deficiency anemia, unspecified: Secondary | ICD-10-CM | POA: Diagnosis not present

## 2023-07-12 DIAGNOSIS — D631 Anemia in chronic kidney disease: Secondary | ICD-10-CM | POA: Diagnosis not present

## 2023-07-12 DIAGNOSIS — N250 Renal osteodystrophy: Secondary | ICD-10-CM | POA: Diagnosis not present

## 2023-07-12 DIAGNOSIS — Z992 Dependence on renal dialysis: Secondary | ICD-10-CM | POA: Diagnosis not present

## 2023-07-12 DIAGNOSIS — N186 End stage renal disease: Secondary | ICD-10-CM | POA: Diagnosis not present

## 2023-07-14 DIAGNOSIS — N186 End stage renal disease: Secondary | ICD-10-CM | POA: Diagnosis not present

## 2023-07-14 DIAGNOSIS — E785 Hyperlipidemia, unspecified: Secondary | ICD-10-CM | POA: Diagnosis not present

## 2023-07-14 DIAGNOSIS — D631 Anemia in chronic kidney disease: Secondary | ICD-10-CM | POA: Diagnosis not present

## 2023-07-14 DIAGNOSIS — Z992 Dependence on renal dialysis: Secondary | ICD-10-CM | POA: Diagnosis not present

## 2023-07-14 DIAGNOSIS — N250 Renal osteodystrophy: Secondary | ICD-10-CM | POA: Diagnosis not present

## 2023-07-14 DIAGNOSIS — D509 Iron deficiency anemia, unspecified: Secondary | ICD-10-CM | POA: Diagnosis not present

## 2023-07-14 DIAGNOSIS — Z5181 Encounter for therapeutic drug level monitoring: Secondary | ICD-10-CM | POA: Diagnosis not present

## 2023-07-14 DIAGNOSIS — Z79899 Other long term (current) drug therapy: Secondary | ICD-10-CM | POA: Diagnosis not present

## 2023-07-17 DIAGNOSIS — D631 Anemia in chronic kidney disease: Secondary | ICD-10-CM | POA: Diagnosis not present

## 2023-07-17 DIAGNOSIS — N186 End stage renal disease: Secondary | ICD-10-CM | POA: Diagnosis not present

## 2023-07-17 DIAGNOSIS — Z992 Dependence on renal dialysis: Secondary | ICD-10-CM | POA: Diagnosis not present

## 2023-07-17 DIAGNOSIS — N250 Renal osteodystrophy: Secondary | ICD-10-CM | POA: Diagnosis not present

## 2023-07-17 DIAGNOSIS — D509 Iron deficiency anemia, unspecified: Secondary | ICD-10-CM | POA: Diagnosis not present

## 2023-07-19 DIAGNOSIS — D631 Anemia in chronic kidney disease: Secondary | ICD-10-CM | POA: Diagnosis not present

## 2023-07-19 DIAGNOSIS — N186 End stage renal disease: Secondary | ICD-10-CM | POA: Diagnosis not present

## 2023-07-19 DIAGNOSIS — N250 Renal osteodystrophy: Secondary | ICD-10-CM | POA: Diagnosis not present

## 2023-07-19 DIAGNOSIS — D509 Iron deficiency anemia, unspecified: Secondary | ICD-10-CM | POA: Diagnosis not present

## 2023-07-19 DIAGNOSIS — Z992 Dependence on renal dialysis: Secondary | ICD-10-CM | POA: Diagnosis not present

## 2023-07-21 DIAGNOSIS — D631 Anemia in chronic kidney disease: Secondary | ICD-10-CM | POA: Diagnosis not present

## 2023-07-21 DIAGNOSIS — Z992 Dependence on renal dialysis: Secondary | ICD-10-CM | POA: Diagnosis not present

## 2023-07-21 DIAGNOSIS — D509 Iron deficiency anemia, unspecified: Secondary | ICD-10-CM | POA: Diagnosis not present

## 2023-07-21 DIAGNOSIS — N186 End stage renal disease: Secondary | ICD-10-CM | POA: Diagnosis not present

## 2023-07-21 DIAGNOSIS — N250 Renal osteodystrophy: Secondary | ICD-10-CM | POA: Diagnosis not present

## 2023-07-24 DIAGNOSIS — Z992 Dependence on renal dialysis: Secondary | ICD-10-CM | POA: Diagnosis not present

## 2023-07-24 DIAGNOSIS — N186 End stage renal disease: Secondary | ICD-10-CM | POA: Diagnosis not present

## 2023-07-24 DIAGNOSIS — D509 Iron deficiency anemia, unspecified: Secondary | ICD-10-CM | POA: Diagnosis not present

## 2023-07-24 DIAGNOSIS — D631 Anemia in chronic kidney disease: Secondary | ICD-10-CM | POA: Diagnosis not present

## 2023-07-24 DIAGNOSIS — N250 Renal osteodystrophy: Secondary | ICD-10-CM | POA: Diagnosis not present

## 2023-07-26 DIAGNOSIS — N250 Renal osteodystrophy: Secondary | ICD-10-CM | POA: Diagnosis not present

## 2023-07-26 DIAGNOSIS — D509 Iron deficiency anemia, unspecified: Secondary | ICD-10-CM | POA: Diagnosis not present

## 2023-07-26 DIAGNOSIS — N186 End stage renal disease: Secondary | ICD-10-CM | POA: Diagnosis not present

## 2023-07-26 DIAGNOSIS — D631 Anemia in chronic kidney disease: Secondary | ICD-10-CM | POA: Diagnosis not present

## 2023-07-26 DIAGNOSIS — Z992 Dependence on renal dialysis: Secondary | ICD-10-CM | POA: Diagnosis not present

## 2023-07-27 DIAGNOSIS — D631 Anemia in chronic kidney disease: Secondary | ICD-10-CM | POA: Diagnosis not present

## 2023-07-27 DIAGNOSIS — Z992 Dependence on renal dialysis: Secondary | ICD-10-CM | POA: Diagnosis not present

## 2023-07-27 DIAGNOSIS — D509 Iron deficiency anemia, unspecified: Secondary | ICD-10-CM | POA: Diagnosis not present

## 2023-07-27 DIAGNOSIS — N186 End stage renal disease: Secondary | ICD-10-CM | POA: Diagnosis not present

## 2023-07-27 DIAGNOSIS — N250 Renal osteodystrophy: Secondary | ICD-10-CM | POA: Diagnosis not present

## 2023-07-31 DIAGNOSIS — Z992 Dependence on renal dialysis: Secondary | ICD-10-CM | POA: Diagnosis not present

## 2023-07-31 DIAGNOSIS — D509 Iron deficiency anemia, unspecified: Secondary | ICD-10-CM | POA: Diagnosis not present

## 2023-07-31 DIAGNOSIS — D631 Anemia in chronic kidney disease: Secondary | ICD-10-CM | POA: Diagnosis not present

## 2023-07-31 DIAGNOSIS — N250 Renal osteodystrophy: Secondary | ICD-10-CM | POA: Diagnosis not present

## 2023-07-31 DIAGNOSIS — N186 End stage renal disease: Secondary | ICD-10-CM | POA: Diagnosis not present

## 2023-08-02 DIAGNOSIS — N250 Renal osteodystrophy: Secondary | ICD-10-CM | POA: Diagnosis not present

## 2023-08-02 DIAGNOSIS — D509 Iron deficiency anemia, unspecified: Secondary | ICD-10-CM | POA: Diagnosis not present

## 2023-08-02 DIAGNOSIS — N186 End stage renal disease: Secondary | ICD-10-CM | POA: Diagnosis not present

## 2023-08-02 DIAGNOSIS — Z992 Dependence on renal dialysis: Secondary | ICD-10-CM | POA: Diagnosis not present

## 2023-08-02 DIAGNOSIS — D631 Anemia in chronic kidney disease: Secondary | ICD-10-CM | POA: Diagnosis not present

## 2023-08-04 DIAGNOSIS — Z992 Dependence on renal dialysis: Secondary | ICD-10-CM | POA: Diagnosis not present

## 2023-08-04 DIAGNOSIS — D509 Iron deficiency anemia, unspecified: Secondary | ICD-10-CM | POA: Diagnosis not present

## 2023-08-04 DIAGNOSIS — D631 Anemia in chronic kidney disease: Secondary | ICD-10-CM | POA: Diagnosis not present

## 2023-08-04 DIAGNOSIS — N250 Renal osteodystrophy: Secondary | ICD-10-CM | POA: Diagnosis not present

## 2023-08-04 DIAGNOSIS — N186 End stage renal disease: Secondary | ICD-10-CM | POA: Diagnosis not present

## 2023-08-06 ENCOUNTER — Inpatient Hospital Stay: Payer: Medicare Other | Attending: Hematology

## 2023-08-06 DIAGNOSIS — D631 Anemia in chronic kidney disease: Secondary | ICD-10-CM | POA: Insufficient documentation

## 2023-08-06 DIAGNOSIS — N186 End stage renal disease: Secondary | ICD-10-CM | POA: Diagnosis not present

## 2023-08-06 DIAGNOSIS — C7A00 Malignant carcinoid tumor of unspecified site: Secondary | ICD-10-CM

## 2023-08-06 DIAGNOSIS — D509 Iron deficiency anemia, unspecified: Secondary | ICD-10-CM | POA: Diagnosis not present

## 2023-08-06 DIAGNOSIS — Z992 Dependence on renal dialysis: Secondary | ICD-10-CM | POA: Insufficient documentation

## 2023-08-06 DIAGNOSIS — C7B02 Secondary carcinoid tumors of liver: Secondary | ICD-10-CM | POA: Insufficient documentation

## 2023-08-06 DIAGNOSIS — Z79899 Other long term (current) drug therapy: Secondary | ICD-10-CM | POA: Diagnosis not present

## 2023-08-06 LAB — COMPREHENSIVE METABOLIC PANEL
ALT: 12 U/L (ref 0–44)
AST: 25 U/L (ref 15–41)
Albumin: 3.2 g/dL — ABNORMAL LOW (ref 3.5–5.0)
Alkaline Phosphatase: 102 U/L (ref 38–126)
Anion gap: 12 (ref 5–15)
BUN: 29 mg/dL — ABNORMAL HIGH (ref 8–23)
CO2: 29 mmol/L (ref 22–32)
Calcium: 9.2 mg/dL (ref 8.9–10.3)
Chloride: 96 mmol/L — ABNORMAL LOW (ref 98–111)
Creatinine, Ser: 5.76 mg/dL — ABNORMAL HIGH (ref 0.61–1.24)
GFR, Estimated: 9 mL/min — ABNORMAL LOW (ref >60)
Glucose, Bld: 83 mg/dL (ref 70–99)
Potassium: 3.4 mmol/L — ABNORMAL LOW (ref 3.5–5.1)
Sodium: 137 mmol/L (ref 135–145)
Total Bilirubin: 1 mg/dL (ref 0.0–1.2)
Total Protein: 7 g/dL (ref 6.5–8.1)

## 2023-08-06 LAB — CBC WITH DIFFERENTIAL/PLATELET
Abs Immature Granulocytes: 0.02 10*3/uL (ref 0.00–0.07)
Basophils Absolute: 0 10*3/uL (ref 0.0–0.1)
Basophils Relative: 1 %
Eosinophils Absolute: 0.1 10*3/uL (ref 0.0–0.5)
Eosinophils Relative: 2 %
HCT: 35.7 % — ABNORMAL LOW (ref 39.0–52.0)
Hemoglobin: 11.5 g/dL — ABNORMAL LOW (ref 13.0–17.0)
Immature Granulocytes: 0 %
Lymphocytes Relative: 6 %
Lymphs Abs: 0.4 10*3/uL — ABNORMAL LOW (ref 0.7–4.0)
MCH: 30.7 pg (ref 26.0–34.0)
MCHC: 32.2 g/dL (ref 30.0–36.0)
MCV: 95.5 fL (ref 80.0–100.0)
Monocytes Absolute: 0.6 10*3/uL (ref 0.1–1.0)
Monocytes Relative: 9 %
Neutro Abs: 5.8 10*3/uL (ref 1.7–7.7)
Neutrophils Relative %: 82 %
Platelets: 215 10*3/uL (ref 150–400)
RBC: 3.74 MIL/uL — ABNORMAL LOW (ref 4.22–5.81)
RDW: 13.5 % (ref 11.5–15.5)
WBC: 7 10*3/uL (ref 4.0–10.5)
nRBC: 0 % (ref 0.0–0.2)

## 2023-08-07 ENCOUNTER — Other Ambulatory Visit (HOSPITAL_COMMUNITY): Payer: Medicare Other

## 2023-08-07 DIAGNOSIS — D631 Anemia in chronic kidney disease: Secondary | ICD-10-CM | POA: Diagnosis not present

## 2023-08-07 DIAGNOSIS — Z992 Dependence on renal dialysis: Secondary | ICD-10-CM | POA: Diagnosis not present

## 2023-08-07 DIAGNOSIS — D509 Iron deficiency anemia, unspecified: Secondary | ICD-10-CM | POA: Diagnosis not present

## 2023-08-07 DIAGNOSIS — N250 Renal osteodystrophy: Secondary | ICD-10-CM | POA: Diagnosis not present

## 2023-08-07 DIAGNOSIS — N186 End stage renal disease: Secondary | ICD-10-CM | POA: Diagnosis not present

## 2023-08-08 ENCOUNTER — Encounter (HOSPITAL_COMMUNITY)
Admission: RE | Admit: 2023-08-08 | Discharge: 2023-08-08 | Disposition: A | Discharge status: Home / Self Care | Payer: Medicare Other | Source: Ambulatory Visit | Attending: Hematology | Admitting: Hematology

## 2023-08-08 DIAGNOSIS — C7A00 Malignant carcinoid tumor of unspecified site: Secondary | ICD-10-CM | POA: Insufficient documentation

## 2023-08-08 DIAGNOSIS — R590 Localized enlarged lymph nodes: Secondary | ICD-10-CM | POA: Insufficient documentation

## 2023-08-08 DIAGNOSIS — C7B02 Secondary carcinoid tumors of liver: Secondary | ICD-10-CM | POA: Diagnosis not present

## 2023-08-08 DIAGNOSIS — C772 Secondary and unspecified malignant neoplasm of intra-abdominal lymph nodes: Secondary | ICD-10-CM | POA: Diagnosis not present

## 2023-08-08 DIAGNOSIS — C7A8 Other malignant neuroendocrine tumors: Secondary | ICD-10-CM | POA: Diagnosis not present

## 2023-08-08 LAB — CHROMOGRANIN A: Chromogranin A (ng/mL): 1005 ng/mL — ABNORMAL HIGH (ref 0.0–101.8)

## 2023-08-08 MED ORDER — COPPER CU 64 DOTATATE 1 MCI/ML IV SOLN
4.0000 | Freq: Once | INTRAVENOUS | Status: AC
  Administered 2023-08-08: 4 via INTRAVENOUS

## 2023-08-09 DIAGNOSIS — D509 Iron deficiency anemia, unspecified: Secondary | ICD-10-CM | POA: Diagnosis not present

## 2023-08-09 DIAGNOSIS — D631 Anemia in chronic kidney disease: Secondary | ICD-10-CM | POA: Diagnosis not present

## 2023-08-09 DIAGNOSIS — Z992 Dependence on renal dialysis: Secondary | ICD-10-CM | POA: Diagnosis not present

## 2023-08-09 DIAGNOSIS — N186 End stage renal disease: Secondary | ICD-10-CM | POA: Diagnosis not present

## 2023-08-09 DIAGNOSIS — N250 Renal osteodystrophy: Secondary | ICD-10-CM | POA: Diagnosis not present

## 2023-08-11 DIAGNOSIS — D631 Anemia in chronic kidney disease: Secondary | ICD-10-CM | POA: Diagnosis not present

## 2023-08-11 DIAGNOSIS — N250 Renal osteodystrophy: Secondary | ICD-10-CM | POA: Diagnosis not present

## 2023-08-11 DIAGNOSIS — D509 Iron deficiency anemia, unspecified: Secondary | ICD-10-CM | POA: Diagnosis not present

## 2023-08-11 DIAGNOSIS — N186 End stage renal disease: Secondary | ICD-10-CM | POA: Diagnosis not present

## 2023-08-11 DIAGNOSIS — Z992 Dependence on renal dialysis: Secondary | ICD-10-CM | POA: Diagnosis not present

## 2023-08-14 ENCOUNTER — Inpatient Hospital Stay (HOSPITAL_BASED_OUTPATIENT_CLINIC_OR_DEPARTMENT_OTHER): Payer: Medicare Other | Admitting: Hematology

## 2023-08-14 VITALS — BP 106/67 | HR 41 | Temp 97.5°F | Resp 16 | Wt 142.6 lb

## 2023-08-14 DIAGNOSIS — D631 Anemia in chronic kidney disease: Secondary | ICD-10-CM | POA: Diagnosis not present

## 2023-08-14 DIAGNOSIS — D509 Iron deficiency anemia, unspecified: Secondary | ICD-10-CM | POA: Diagnosis not present

## 2023-08-14 DIAGNOSIS — Z992 Dependence on renal dialysis: Secondary | ICD-10-CM | POA: Diagnosis not present

## 2023-08-14 DIAGNOSIS — C7A00 Malignant carcinoid tumor of unspecified site: Secondary | ICD-10-CM | POA: Diagnosis not present

## 2023-08-14 DIAGNOSIS — C7B02 Secondary carcinoid tumors of liver: Secondary | ICD-10-CM | POA: Diagnosis not present

## 2023-08-14 DIAGNOSIS — R197 Diarrhea, unspecified: Secondary | ICD-10-CM | POA: Diagnosis not present

## 2023-08-14 DIAGNOSIS — N250 Renal osteodystrophy: Secondary | ICD-10-CM | POA: Diagnosis not present

## 2023-08-14 DIAGNOSIS — N186 End stage renal disease: Secondary | ICD-10-CM | POA: Diagnosis not present

## 2023-08-14 DIAGNOSIS — Z79899 Other long term (current) drug therapy: Secondary | ICD-10-CM | POA: Diagnosis not present

## 2023-08-14 MED ORDER — DIPHENOXYLATE-ATROPINE 2.5-0.025 MG PO TABS: 2.0000 | ORAL_TABLET | Freq: Four times a day (QID) | ORAL | 3 refills | Status: AC | PRN

## 2023-08-14 NOTE — Patient Instructions (Signed)
[facility] at [facility] Discharge Instructions   You were seen and examined today by Dr. Ellin Saba.  He reviewed the results of your lab work which are normal/stable.   He reviewed the results of your PET scan. It is showing the cancer in the liver is bigger.   Return as scheduled.    Thank you for choosing [facility] at [facility] to provide your oncology and hematology care.  To afford each patient quality time with our provider, please arrive at least 15 minutes before your scheduled appointment time.   If you have a lab appointment with the Cancer Center please come in thru the Main Entrance and check in at the main information desk.  You need to re-schedule your appointment should you arrive 10 or more minutes late.  We strive to give you quality time with our providers, and arriving late affects you and other patients whose appointments are after yours.  Also, if you no show three or more times for appointments you may be dismissed from the clinic at the providers discretion.     Again, thank you for choosing [facility].  Our hope is that these requests will decrease the amount of time that you wait before being seen by our physicians.       _____________________________________________________________  Should you have questions after your visit to [facility], please contact our office at [phone] and follow the prompts.  Our office hours are 8:00 a.m. and 4:30 p.m. Monday - Friday.  Please note that voicemails left after 4:00 p.m. may not be returned until the following business day.  We are closed weekends and major holidays.  You do have access to a nurse 24-7, just call the main number to the clinic [phone] and do not press any options, hold on the line and a nurse will answer the phone.    For prescription refill requests, have your pharmacy contact our office and allow 72 hours.    Due to  Covid, you will need to wear a mask upon entering the hospital. If you do not have a mask, a mask will be given to you at the Main Entrance upon arrival. For doctor visits, patients may have 1 support person age 38 or older with them. For treatment visits, patients can not have anyone with them due to social distancing guidelines and our immunocompromised population.

## 2023-08-14 NOTE — Progress Notes (Signed)
[facility] [address] [place] [address]    Clinic Day:  08/14/2023  Referring physician: Toma Deiters, MD  Patient Care Team: Toma Deiters, MD as PCP - General (Internal Medicine) Doreatha Massed, MD as Medical Oncologist (Medical Oncology)   ASSESSMENT & PLAN:   Assessment: 1.  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  Liver biopsy-well-differentiated neuroendocrine tumor. -Sandostatin started back about 5 years ago.  Everolimus 5 mg daily started on 05/17/2018.  Everolimus discontinued on 02/01/2022. - PET scan (01/27/2022): Clear progression of hepatic metastatic tumor with a large central left hepatic lesion.  Multiple additional smaller metastatic lesions are increased in activity.  Stable multifocal peritoneal and mesenteric metastasis with some increase in radiotracer activity.  No new metastatic disease. - Umbilical nodule biopsy (02/10/2022): Metastatic neuroendocrine tumor - NGS test: TMB-low, MSI-stable, no other targetable mutations.  - Dotatate PET scan (06/14/2022): Progression of well-differentiated neuroendocrine tumor within the liver with dominant lesion in the central left hepatic lobe increased by 1 cm.  Other lesions have increased SUV.  Central mesenteric calcified mass is stable.  New peritoneal nodule superior left of the central mesenteric nodule.  New peritoneal nodule in the left ventral peritoneal space.  Lesion of the umbilicus with SUV 20, previously 26.  Implants in the deep peritoneal space stable.  Single focus of uptake in the medial left clavicle with SUV 5.7. - Other options including Temodar and PRRT have not been studied in dialysis patients.  I have reached out to [facility] and they do not have any trials open.  Capecitabine can be given with dose reduction in dialysis patients. - We tried to get him on lanreotide  with pegylated interferon.  He could not afford the high co-pay. - Lanreotide every 2 weeks started on 07/12/2022, discontinued on 01/03/2023 due to progression.   2.  Normocytic anemia: -This is from combination of end-stage renal disease and relative iron deficiency and myelosuppression from everolimus.   3.  ESRD on HD: -Started on HD on 05/20/2019    Plan: 1.  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 - He has diarrhea 5-6 times per day, watery for the last 2 weeks.  He lost about 20 pounds since last visit. - Reviewed labs from 08/06/2023: Normal LFTs.  CBC was stable.  Chromogranin was 1005. - PET scan (08/08/2023): Dominant lesion in the central liver continues to increase in size with central necrosis.  Intense radiotracer avid mesenteric lymph nodes and upper abdominal lymph nodes are similar to prior.  Interval decreased activity in the medial aspect of the clavicle.  Interval resolution of splenic lesion. - Recommend checking stool for C. difficile and GI panel.  If they are ruled out, this is most likely from progression of his tumor and he may use Lomotil as needed.  None of the newer treatments are tested to be given with hemodialysis patients.   2.  Normocytic anemia: - Continue Retacrit with hemodialysis.  Hemoglobin is 11.5.   3.  ESRD on HD: - Continue HD on Tuesday, Thursday and Saturday.    Orders Placed This Encounter  Procedures   GI pathogen panel by PCR, stool    Standing Status:   Future    Expiration Date:   08/13/2024   C difficile quick screen w PCR reflex    Standing Status:  Future    Expiration Date:   08/13/2024     Alben Deeds Teague,acting as a scribe for Doreatha Massed, MD.,have documented all relevant documentation on the behalf of Doreatha Massed, MD,as directed by  Doreatha Massed, MD while in the presence of Doreatha Massed, MD.  .I, Doreatha Massed MD, have  reviewed the above documentation for accuracy and completeness, and I agree with the above.    Doreatha Massed, MD   1/28/20255:27 PM  CHIEF COMPLAINT:   Diagnosis: metastatic carcinoid tumor of small intestine    Cancer Staging  No matching staging information was found for the patient.    Prior Therapy: Sandostatin monthly and everolimus discontinued on 02/01/2022   Current Therapy:  Lanreotide every 2 weeks    HISTORY OF PRESENT ILLNESS:   Oncology History   No history exists.     INTERVAL HISTORY:   Joan is a 83 y.o. male seen for follow-up of metastatic neuroendocrine tumor. He was last seen by me on 05/07/23.  Since his last visit, he underwent PET dotatate on 08/08/23 that found: dominant lesion in the central liver continues to increase in size, lesion now is peripherally hypermetabolic with central photopenia suggesting central necrosis; other small liver lesions are similar to prior; intense radiotracer avid mesenteric lymph nodes and upper abdominal lymph nodes are similar to prior; interval decreased activity in the medial aspect of the clavicle; additional metastatic lesions in the thorax are unchanged; interval resolution of splenic lesion; and no new sites of metastatic disease.  Today, he states that he is doing well overall. His appetite level is at 50-75%. His energy level is at 0%. He is accompanied by a family member.  He reports not feeling well for the past 2 weeks. He reports diarrhea 5-6 times a day with watery stools for the past 2 weeks. He was given some sort of medication at dialysis that temporarily relieved diarrhea and has since stopped working. He does not take any other medication for symptoms. His family member notes he is very weak and barely able to ambulate. He denies any abdominal pain. He also has decreased energy levels and appetite. Almost immediately after eating, he has a BM. He has lost 15 pounds since his last visit.  PAST MEDICAL  HISTORY:   Past Medical History: Past Medical History:  Diagnosis Date   Anemia    Cancer (HCC)    right renal . Prostate- Radiation treatment   CHF (congestive heart failure) (HCC)    Chronic kidney disease    Dialysis T/TH/Sa   Edema    Heart murmur    "nothing to worry about"   Hyperlipidemia    Hypertension    Malignant carcinoid tumor (HCC) 01/03/2016   Pneumonia    as a child    Surgical History: Past Surgical History:  Procedure Laterality Date   AV FISTULA PLACEMENT Left 10/20/2014   Procedure: Left Arm ARTERIOVENOUS (AV) FISTULA CREATION;  Surgeon: Sherren Kerns, MD;  Location: [facility] OR;  Service: Vascular;  Laterality: Left;   NEPHRECTOMY Right 2010   PERIPHERAL VASCULAR BALLOON ANGIOPLASTY  06/20/2019   Procedure: PERIPHERAL VASCULAR BALLOON ANGIOPLASTY;  Surgeon: Sherren Kerns, MD;  Location: [facility] INVASIVE CV LAB;  Service: Cardiovascular;;   REVISON OF ARTERIOVENOUS FISTULA Left 08/04/2019   Procedure: REVISON OF ARTERIOVENOUS FISTULA WITH SIDE BRANCH LIGATION;  Surgeon: Sherren Kerns, MD;  Location: [facility] OR;  Service: Vascular;  Laterality: Left;    Social History: Social History   Socioeconomic History  Marital status: Married    Spouse name: Not on file   Number of children: Not on file   Years of education: Not on file   Highest education level: Not on file  Occupational History   Not on file  Tobacco Use   Smoking status: Never   Smokeless tobacco: Never  Vaping Use   Vaping status: Never Used  Substance and Sexual Activity   Alcohol use: No    Alcohol/week: 0.0 standard drinks of alcohol   Drug use: No   Sexual activity: Not on file  Other Topics Concern   Not on file  Social History Narrative   Not on file   Social Drivers of Health   Financial Resource Strain: Low Risk  (05/24/2020)   Overall Financial Resource Strain (CARDIA)    Difficulty of Paying Living Expenses: Not hard at all  Food Insecurity: No Food Insecurity (05/24/2020)    Hunger Vital Sign    Worried About Running Out of Food in the Last Year: Never true    Ran Out of Food in the Last Year: Never true  Transportation Needs: No Transportation Needs (05/24/2020)   PRAPARE - Administrator, Civil Service (Medical): No    Lack of Transportation (Non-Medical): No  Physical Activity: Insufficiently Active (05/24/2020)   Exercise Vital Sign    Days of Exercise per Week: 7 days    Minutes of Exercise per Session: 10 min  Stress: No Stress Concern Present (05/24/2020)   [employer] of Occupational Health - Occupational Stress Questionnaire    Feeling of Stress : Not at all  Social Connections: Moderately Integrated (05/24/2020)   Social Connection and Isolation Panel [NHANES]    Frequency of Communication with Friends and Family: More than three times a week    Frequency of Social Gatherings with Friends and Family: Once a week    Attends Religious Services: More than 4 times per year    Active Member of [employer] or Organizations: No    Attends Banker Meetings: Never    Marital Status: Married  Catering manager Violence: Not At Risk (05/24/2020)   Humiliation, Afraid, Rape, and Kick questionnaire    Fear of Current or Ex-Partner: No    Emotionally Abused: No    Physically Abused: No    Sexually Abused: No    Family History: Family History  Problem Relation Age of Onset   Cancer Mother    Hypertension Father    Cancer Sister     Current Medications:  Current Outpatient Medications:    amLODipine (NORVASC) 10 MG tablet, Take 1 tablet every day by oral route., Disp: , Rfl:    ammonium lactate (AMLACTIN) 12 % cream, Apply topically., Disp: , Rfl:    atorvastatin (LIPITOR) 80 MG tablet, Take 80 mg by mouth daily at 6 PM. , Disp: , Rfl:    calcium acetate (PHOSLO) 667 MG capsule, Take by mouth., Disp: , Rfl:    Calcium Acetate 667 MG TABS, Take 667-2,001 mg by mouth See admin instructions. Take 2001 mg with each meal and 667 mg  with each snack, Disp: , Rfl:    Calcium Carb-Cholecalciferol (CALCIUM 500 + D PO), Take 1 tablet by mouth in the morning, at noon, and at bedtime., Disp: , Rfl:    carvedilol (COREG) 6.25 MG tablet, Take 6.25 mg by mouth 2 (two) times daily., Disp: , Rfl:    diphenoxylate-atropine (LOMOTIL) 2.5-0.025 MG tablet, Take 2 tablets by mouth 4 (four) times  daily as needed for diarrhea or loose stools., Disp: 60 tablet, Rfl: 3   fexofenadine (ALLEGRA) 180 MG tablet, Take 180 mg by mouth daily., Disp: , Rfl:    furosemide (LASIX) 40 MG tablet, Take 40 mg by mouth every morning., Disp: , Rfl:    mirtazapine (REMERON) 7.5 MG tablet, Take 7.5 mg by mouth at bedtime., Disp: , Rfl:    Omega-3 Fatty Acids (FISH OIL) 1000 MG CAPS, Take 1,000 mg by mouth daily. , Disp: , Rfl:    peginterferon alfa-2a (PEGASYS) 180 MCG/ML injection, Inject 0.75 mLs (135 mcg total) into the skin every 14 (fourteen) days., Disp: 2 mL, Rfl: 1   Riboflavin (B2) 100 MG TABS, Take 100 mg by mouth daily., Disp: , Rfl:    sodium bicarbonate 650 MG tablet, Take 650 mg by mouth 3 (three) times daily., Disp: , Rfl:    sodium bicarbonate 650 MG tablet, Take 1 tablet by mouth 3 (three) times daily., Disp: , Rfl:    TUBERCULIN SYR 1CC/27GX1/2" (B-D TB SYRINGE 1CC/27GX1/2") 27G X 1/2" 1 ML MISC, Use to administer pegasys every 14 days, Disp: 25 each, Rfl: 0   Allergies: No Known Allergies  REVIEW OF SYSTEMS:   Review of Systems  Constitutional:  Positive for fatigue. Negative for chills and fever.  HENT:   Negative for lump/mass, mouth sores, nosebleeds, sore throat and trouble swallowing.   Eyes:  Negative for eye problems.  Respiratory:  Negative for cough and shortness of breath.   Cardiovascular:  Negative for chest pain, leg swelling and palpitations.  Gastrointestinal:  Positive for diarrhea. Negative for abdominal pain, constipation, nausea and vomiting.  Genitourinary:  Negative for bladder incontinence, difficulty urinating,  dysuria, frequency, hematuria and nocturia.   Musculoskeletal:  Negative for arthralgias, back pain, flank pain, myalgias and neck pain.  Skin:  Negative for itching and rash.  Neurological:  Negative for dizziness, headaches and numbness.  Hematological:  Does not bruise/bleed easily.  Psychiatric/Behavioral:  Negative for depression, sleep disturbance and suicidal ideas. The patient is not nervous/anxious.   All other systems reviewed and are negative.    VITALS:   Blood pressure 106/67, pulse (!) 41, temperature (!) 97.5 F (36.4 C), temperature source Oral, resp. rate 16, weight 142 lb 10.2 oz (64.7 kg), SpO2 99%.  Wt Readings from Last 3 Encounters:  08/14/23 142 lb 10.2 oz (64.7 kg)  05/07/23 167 lb (75.8 kg)  01/03/23 161 lb 11.2 oz (73.3 kg)    Body mass index is 16.91 kg/m.  Performance status (ECOG): 1 - Symptomatic but completely ambulatory  PHYSICAL EXAM:   Physical Exam Vitals and nursing note reviewed. Exam conducted with a chaperone present.  Constitutional:      Appearance: Normal appearance.  Cardiovascular:     Rate and Rhythm: Normal rate and regular rhythm.     Pulses: Normal pulses.     Heart sounds: Normal heart sounds.  Pulmonary:     Effort: Pulmonary effort is normal.     Breath sounds: Normal breath sounds.  Abdominal:     Palpations: Abdomen is soft. There is no hepatomegaly, splenomegaly or mass.     Tenderness: There is no abdominal tenderness.  Musculoskeletal:     Right lower leg: No edema.     Left lower leg: No edema.  Lymphadenopathy:     Cervical: No cervical adenopathy.     Right cervical: No superficial, deep or posterior cervical adenopathy.    Left cervical: No superficial, deep or posterior  cervical adenopathy.     Upper Body:     Right upper body: No supraclavicular or axillary adenopathy.     Left upper body: No supraclavicular or axillary adenopathy.  Neurological:     General: No focal deficit present.     Mental Status:  He is alert and oriented to person, place, and time.  Psychiatric:        Mood and Affect: Mood normal.        Behavior: Behavior normal.     LABS:      Latest Ref Rng & Units 08/06/2023   10:19 AM 04/30/2023   10:44 AM 12/25/2022   10:11 AM  CBC  WBC 4.0 - 10.5 K/uL 7.0  5.7  5.6   Hemoglobin 13.0 - 17.0 g/dL 25.3  66.4  40.3   Hematocrit 39.0 - 52.0 % 35.7  35.4  33.9   Platelets 150 - 400 K/uL 215  124  121       Latest Ref Rng & Units 08/06/2023   10:19 AM 04/30/2023   10:44 AM 12/25/2022   10:11 AM  CMP  Glucose 70 - 99 mg/dL 83  87  82   BUN 8 - 23 mg/dL 29  37  32   Creatinine 0.61 - 1.24 mg/dL 4.74  2.59  5.63   Sodium 135 - 145 mmol/L 137  136  136   Potassium 3.5 - 5.1 mmol/L 3.4  3.7  3.9   Chloride 98 - 111 mmol/L 96  98  96   CO2 22 - 32 mmol/L 29  29  32   Calcium 8.9 - 10.3 mg/dL 9.2  8.7  8.7   Total Protein 6.5 - 8.1 g/dL 7.0  6.4  5.8   Total Bilirubin 0.0 - 1.2 mg/dL 1.0  0.7  0.9   Alkaline Phos 38 - 126 U/L 102  112  78   AST 15 - 41 U/L 25  20  20    ALT 0 - 44 U/L 12  15  11       No results found for: "CEA1", "CEA" / No results found for: "CEA1", "CEA" No results found for: "PSA1" No results found for: "OVF643" No results found for: "CAN125"  No results found for: "TOTALPROTELP", "ALBUMINELP", "A1GS", "A2GS", "BETS", "BETA2SER", "GAMS", "MSPIKE", "SPEI" Lab Results  Component Value Date   TIBC 226 (L) 08/06/2019   TIBC 229 (L) 06/30/2019   TIBC 175 (L) 06/06/2019   FERRITIN 454 (H) 08/06/2019   FERRITIN 466 (H) 06/30/2019   FERRITIN 767 (H) 06/06/2019   IRONPCTSAT 50 (H) 08/06/2019   IRONPCTSAT 34 06/30/2019   IRONPCTSAT 9 (L) 06/06/2019   Lab Results  Component Value Date   LDH 131 06/12/2022   LDH 163 09/12/2021   LDH 153 07/20/2021     STUDIES:   NM PET DOTATATE SKULL BASE TO MID THIGH Result Date: 08/08/2023 CLINICAL DATA:  Well differentiated neuroendocrine tumor with liver in nodal metastasis. EXAM: NUCLEAR MEDICINE PET  SKULL BASE TO THIGH TECHNIQUE: 4.0 mCi copper 64 DOTATATE was injected intravenously. Full-ring PET imaging was performed from the skull base to thigh after the radiotracer. CT data was obtained and used for attenuation correction and anatomic localization. COMPARISON:  CT 12/27/2022 FINDINGS: NECK No radiotracer activity in neck lymph nodes. Incidental CT findings: None CHEST No radiotracer accumulation within mediastinal or hilar lymph nodes. No suspicious pulmonary nodules on the CT scan. Incidental CT finding:None ABDOMEN/PELVIS multifocal intense radiotracer avid lesions again noted within liver.  Dominant lesion in the central liver continues to increase in size measuring 8.6 x 6.1 cm (image 92/4) compared to 6.6 x 4.6 cm. Lesion now is peripherally hypermetabolic with central photopenia where as on previously it was solidly hypermetabolic. Findings suggest central necrosis. Lesion is intensely tracer avid SUV max equal 26 compared SUV max equal 29 for no interval change. Other small lesions are similar to prior. Approximately 8 lesions in the RIGHT hepatic lobe. Again demonstrated intense radiotracer avid lesions within the upper abdominal lymph nodes and RIGHT central mesenteric nodes. Example partially calcified mesenteric mass in the RIGHT lower quadrant measures 4.0 Cm (image 128) compared to 4.9 cm with SUV max equal 22 compared SUV max equal 30.9. Nodule at the umbilicus with intense radiotracer activity (SUV max equal 23) compares to SUV max equal 23. No new mesenteric lesions. Lesion in the medial aspect of the spleen has resolved. Physiologic activity noted in the liver, spleen, adrenal glands and kidneys. Incidental CT findings: SKELETON Multiple small intense radiotracer avid skeletal metastasis again noted. Interval decreased activity in region in the medial aspect of the clavicle with SUV max equal 4.8 compared SUV max 14.1 (image 42). Lesion in the T4 vertebral body with SUV max equal 11.5  compared SUV max 6.3. Lesion in the lateral border of the sternum with SUV max equal 7.0 compared SUV max equal 6.4 on image 73. Incidental CT findings:None IMPRESSION: 1. Dominant lesion in the central liver continues to increase in size. Lesion now is peripherally hypermetabolic with central photopenia suggesting central necrosis. 2. Other small liver lesions are similar to prior. 3. Intense radiotracer avid mesenteric lymph nodes and upper abdominal lymph nodes are similar to prior. 4. Interval decreased activity in the medial aspect of the clavicle. Additional metastatic lesions in the thorax are unchanged. 5. Interval resolution of splenic lesion. 6. No new sites of metastatic disease. Electronically Signed   By: Genevive Bi M.D.   On: 08/08/2023 14:05

## 2023-08-15 ENCOUNTER — Other Ambulatory Visit: Payer: Self-pay

## 2023-08-15 DIAGNOSIS — C7A00 Malignant carcinoid tumor of unspecified site: Secondary | ICD-10-CM | POA: Diagnosis not present

## 2023-08-15 DIAGNOSIS — D509 Iron deficiency anemia, unspecified: Secondary | ICD-10-CM | POA: Diagnosis not present

## 2023-08-15 DIAGNOSIS — Z79899 Other long term (current) drug therapy: Secondary | ICD-10-CM | POA: Diagnosis not present

## 2023-08-15 DIAGNOSIS — C7B02 Secondary carcinoid tumors of liver: Secondary | ICD-10-CM | POA: Diagnosis not present

## 2023-08-15 DIAGNOSIS — N186 End stage renal disease: Secondary | ICD-10-CM | POA: Diagnosis not present

## 2023-08-15 DIAGNOSIS — R197 Diarrhea, unspecified: Secondary | ICD-10-CM

## 2023-08-15 DIAGNOSIS — D631 Anemia in chronic kidney disease: Secondary | ICD-10-CM | POA: Diagnosis not present

## 2023-08-15 LAB — C DIFFICILE QUICK SCREEN W PCR REFLEX
C Diff antigen: NEGATIVE
C Diff interpretation: NOT DETECTED
C Diff toxin: NEGATIVE

## 2023-08-16 DIAGNOSIS — D509 Iron deficiency anemia, unspecified: Secondary | ICD-10-CM | POA: Diagnosis not present

## 2023-08-16 DIAGNOSIS — Z992 Dependence on renal dialysis: Secondary | ICD-10-CM | POA: Diagnosis not present

## 2023-08-16 DIAGNOSIS — D631 Anemia in chronic kidney disease: Secondary | ICD-10-CM | POA: Diagnosis not present

## 2023-08-16 DIAGNOSIS — N250 Renal osteodystrophy: Secondary | ICD-10-CM | POA: Diagnosis not present

## 2023-08-16 DIAGNOSIS — N186 End stage renal disease: Secondary | ICD-10-CM | POA: Diagnosis not present

## 2023-08-17 DIAGNOSIS — N186 End stage renal disease: Secondary | ICD-10-CM | POA: Diagnosis not present

## 2023-08-17 DIAGNOSIS — Z992 Dependence on renal dialysis: Secondary | ICD-10-CM | POA: Diagnosis not present

## 2023-08-18 LAB — GI PATHOGEN PANEL BY PCR, STOOL

## 2023-08-21 DIAGNOSIS — D509 Iron deficiency anemia, unspecified: Secondary | ICD-10-CM | POA: Diagnosis not present

## 2023-08-21 DIAGNOSIS — N250 Renal osteodystrophy: Secondary | ICD-10-CM | POA: Diagnosis not present

## 2023-08-21 DIAGNOSIS — Z992 Dependence on renal dialysis: Secondary | ICD-10-CM | POA: Diagnosis not present

## 2023-08-21 DIAGNOSIS — N186 End stage renal disease: Secondary | ICD-10-CM | POA: Diagnosis not present

## 2023-08-21 DIAGNOSIS — D631 Anemia in chronic kidney disease: Secondary | ICD-10-CM | POA: Diagnosis not present

## 2023-08-23 ENCOUNTER — Emergency Department (HOSPITAL_COMMUNITY): Payer: Medicare Other

## 2023-08-23 ENCOUNTER — Encounter (HOSPITAL_COMMUNITY): Payer: Self-pay

## 2023-08-23 ENCOUNTER — Other Ambulatory Visit: Payer: Self-pay

## 2023-08-23 ENCOUNTER — Emergency Department (HOSPITAL_COMMUNITY)
Admission: EM | Admit: 2023-08-23 | Discharge: 2023-08-23 | Disposition: A | Discharge status: Home / Self Care | Payer: Medicare Other | Attending: Emergency Medicine | Admitting: Emergency Medicine

## 2023-08-23 DIAGNOSIS — C7A8 Other malignant neuroendocrine tumors: Secondary | ICD-10-CM | POA: Diagnosis not present

## 2023-08-23 DIAGNOSIS — Z85030 Personal history of malignant carcinoid tumor of large intestine: Secondary | ICD-10-CM | POA: Diagnosis not present

## 2023-08-23 DIAGNOSIS — R531 Weakness: Secondary | ICD-10-CM | POA: Diagnosis not present

## 2023-08-23 DIAGNOSIS — N186 End stage renal disease: Secondary | ICD-10-CM | POA: Insufficient documentation

## 2023-08-23 DIAGNOSIS — K429 Umbilical hernia without obstruction or gangrene: Secondary | ICD-10-CM | POA: Diagnosis not present

## 2023-08-23 DIAGNOSIS — E876 Hypokalemia: Secondary | ICD-10-CM | POA: Insufficient documentation

## 2023-08-23 DIAGNOSIS — Z79899 Other long term (current) drug therapy: Secondary | ICD-10-CM | POA: Diagnosis not present

## 2023-08-23 DIAGNOSIS — K8020 Calculus of gallbladder without cholecystitis without obstruction: Secondary | ICD-10-CM | POA: Diagnosis not present

## 2023-08-23 DIAGNOSIS — K8070 Calculus of gallbladder and bile duct without cholecystitis without obstruction: Secondary | ICD-10-CM | POA: Diagnosis not present

## 2023-08-23 DIAGNOSIS — Z992 Dependence on renal dialysis: Secondary | ICD-10-CM | POA: Insufficient documentation

## 2023-08-23 DIAGNOSIS — R1110 Vomiting, unspecified: Secondary | ICD-10-CM

## 2023-08-23 DIAGNOSIS — K828 Other specified diseases of gallbladder: Secondary | ICD-10-CM | POA: Diagnosis not present

## 2023-08-23 DIAGNOSIS — R112 Nausea with vomiting, unspecified: Secondary | ICD-10-CM | POA: Diagnosis not present

## 2023-08-23 DIAGNOSIS — I120 Hypertensive chronic kidney disease with stage 5 chronic kidney disease or end stage renal disease: Secondary | ICD-10-CM | POA: Diagnosis not present

## 2023-08-23 DIAGNOSIS — R1030 Lower abdominal pain, unspecified: Secondary | ICD-10-CM | POA: Diagnosis present

## 2023-08-23 DIAGNOSIS — R1011 Right upper quadrant pain: Secondary | ICD-10-CM | POA: Diagnosis not present

## 2023-08-23 DIAGNOSIS — C787 Secondary malignant neoplasm of liver and intrahepatic bile duct: Secondary | ICD-10-CM | POA: Diagnosis not present

## 2023-08-23 LAB — COMPREHENSIVE METABOLIC PANEL
ALT: 11 U/L (ref 0–44)
AST: 23 U/L (ref 15–41)
Albumin: 2.8 g/dL — ABNORMAL LOW (ref 3.5–5.0)
Alkaline Phosphatase: 69 U/L (ref 38–126)
Anion gap: 13 (ref 5–15)
BUN: 24 mg/dL — ABNORMAL HIGH (ref 8–23)
CO2: 28 mmol/L (ref 22–32)
Calcium: 8.5 mg/dL — ABNORMAL LOW (ref 8.9–10.3)
Chloride: 97 mmol/L — ABNORMAL LOW (ref 98–111)
Creatinine, Ser: 6.62 mg/dL — ABNORMAL HIGH (ref 0.61–1.24)
GFR, Estimated: 8 mL/min — ABNORMAL LOW (ref >60)
Glucose, Bld: 85 mg/dL (ref 70–99)
Potassium: 3.3 mmol/L — ABNORMAL LOW (ref 3.5–5.1)
Sodium: 138 mmol/L (ref 135–145)
Total Bilirubin: 1.1 mg/dL (ref 0.0–1.2)
Total Protein: 5.6 g/dL — ABNORMAL LOW (ref 6.5–8.1)

## 2023-08-23 LAB — CBC WITH DIFFERENTIAL/PLATELET
Abs Immature Granulocytes: 0.01 10*3/uL (ref 0.00–0.07)
Basophils Absolute: 0 10*3/uL (ref 0.0–0.1)
Basophils Relative: 1 %
Eosinophils Absolute: 0.1 10*3/uL (ref 0.0–0.5)
Eosinophils Relative: 2 %
HCT: 34.1 % — ABNORMAL LOW (ref 39.0–52.0)
Hemoglobin: 10.7 g/dL — ABNORMAL LOW (ref 13.0–17.0)
Immature Granulocytes: 0 %
Lymphocytes Relative: 5 %
Lymphs Abs: 0.3 10*3/uL — ABNORMAL LOW (ref 0.7–4.0)
MCH: 30.1 pg (ref 26.0–34.0)
MCHC: 31.4 g/dL (ref 30.0–36.0)
MCV: 96.1 fL (ref 80.0–100.0)
Monocytes Absolute: 0.3 10*3/uL (ref 0.1–1.0)
Monocytes Relative: 7 %
Neutro Abs: 4.2 10*3/uL (ref 1.7–7.7)
Neutrophils Relative %: 85 %
Platelets: 169 10*3/uL (ref 150–400)
RBC: 3.55 MIL/uL — ABNORMAL LOW (ref 4.22–5.81)
RDW: 14.1 % (ref 11.5–15.5)
WBC: 4.9 10*3/uL (ref 4.0–10.5)
nRBC: 0 % (ref 0.0–0.2)

## 2023-08-23 LAB — LIPASE, BLOOD: Lipase: 26 U/L (ref 11–51)

## 2023-08-23 MED ORDER — ONDANSETRON HCL 4 MG/2ML IJ SOLN: 4.0000 mg | Freq: Once | INTRAMUSCULAR | Status: DC | PRN

## 2023-08-23 MED ORDER — SODIUM CHLORIDE 0.9 % IV BOLUS
500.0000 mL | Freq: Once | INTRAVENOUS | Status: AC
  Administered 2023-08-23: 500 mL via INTRAVENOUS

## 2023-08-23 MED ORDER — ONDANSETRON 4 MG PO TBDP: 4.0000 mg | ORAL_TABLET | Freq: Three times a day (TID) | ORAL | 0 refills | Status: DC | PRN

## 2023-08-23 NOTE — ED Notes (Signed)
 Pt discharged. IV discontinued. Education provided. Pt and wife verbalized understanding

## 2023-08-23 NOTE — ED Triage Notes (Addendum)
 Pt c/o dry heaves starting this week. Pt has been sick for a couple of weeks and originally started with diarrhea. Pt was prescribed Diphenoxlate-atropine  by his cancer doctor and states diarrhea has subsided but now he is unable to eat d/t dry heaves.

## 2023-08-23 NOTE — Discharge Instructions (Addendum)
 Call Dialysis today to reschedule for your missed session.  Follow-up with your primary care physician and the general surgeon.  Your CT scan and ultrasound show that you have a gallstone in your gallbladder as well as dilated to being around her gallbladder.  However it does not appear that this is an acute problem today and so you can be seen as an outpatient.  However, if your vomiting worsens or does not improve, you develop new or worsening abdominal pain, or any other new/concerning symptoms then return to the ER or call 911.

## 2023-08-23 NOTE — ED Provider Notes (Signed)
 [place] EMERGENCY DEPARTMENT AT [facility] Provider Note   CSN: [id number] Arrival date & time: 08/23/23  9257     History  Chief Complaint  Patient presents with   Emesis    Allen Davenport is a 83 y.o. male.  HPI 83 year old male presents with vomiting.  Patient has a history of malignant carcinoid tumor, ESRD on dialysis, hypertension, and other comorbidities.  He has been having diarrhea for about 2 weeks and was put on Lomotil .  This has slowed the diarrhea down significantly though it is still going on.  Patient has been having dry heaving on and off for about a week.  No fevers.  He is having some lower abdominal pain.  He last had his dialysis 2 days ago and was due to go again today.  He has been feeling generally weak, no focal weakness. Still urinates some, no pain.  Home Medications Prior to Admission medications   Medication Sig Start Date End Date Taking? Authorizing Provider  ondansetron  (ZOFRAN -ODT) 4 MG disintegrating tablet Take 1 tablet (4 mg total) by mouth every 8 (eight) hours as needed for nausea or vomiting. 08/23/23  Yes Freddi Hamilton, MD  amLODipine  (NORVASC ) 10 MG tablet Take 1 tablet every day by oral route.    [provider]  ammonium lactate (AMLACTIN) 12 % cream Apply topically. 06/19/23   [provider]  atorvastatin  (LIPITOR) 80 MG tablet Take 80 mg by mouth daily at 6 PM.  11/04/13   [provider]  calcium  acetate (PHOSLO ) 667 MG capsule Take by mouth. 11/21/22   [provider]  Calcium  Acetate 667 MG TABS Take 667-2,001 mg by mouth See admin instructions. Take 2001 mg with each meal and 667 mg with each snack    [provider]  Calcium  Carb-Cholecalciferol (CALCIUM  500 + D PO) Take 1 tablet by mouth in the morning, at noon, and at bedtime.    [provider]  carvedilol  (COREG ) 6.25 MG tablet Take 6.25 mg by mouth 2 (two) times daily. 06/01/21   [provider]   diphenoxylate -atropine  (LOMOTIL ) 2.5-0.025 MG tablet Take 2 tablets by mouth 4 (four) times daily as needed for diarrhea or loose stools. 08/14/23   Katragadda, Sreedhar, MD  fexofenadine (ALLEGRA) 180 MG tablet Take 180 mg by mouth daily. 03/02/22   [provider]  furosemide  (LASIX ) 40 MG tablet Take 40 mg by mouth every morning. 06/25/19   [provider]  mirtazapine  (REMERON ) 7.5 MG tablet Take 7.5 mg by mouth at bedtime. 03/07/22   [provider]  Omega-3 Fatty Acids (FISH OIL) 1000 MG CAPS Take 1,000 mg by mouth daily.     [provider]  peginterferon alfa-2a  (PEGASYS ) 180 MCG/ML injection Inject 0.75 mLs (135 mcg total) into the skin every 14 (fourteen) days. 06/22/22   Rogers Hai, MD  Riboflavin (B2) 100 MG TABS Take 100 mg by mouth daily.    [provider]  sodium bicarbonate  650 MG tablet Take 650 mg by mouth 3 (three) times daily.    [provider]  sodium bicarbonate  650 MG tablet Take 1 tablet by mouth 3 (three) times daily.    [provider]  TUBERCULIN SYR 1CC/27GX1/2 (B-D TB SYRINGE 1CC/27GX1/2) 27G X 1/2 1 ML MISC Use to administer pegasys  every 14 days 06/22/22   Rogers Hai, MD      Allergies    Patient has no known allergies.    Review of Systems   Review  of Systems  Constitutional:  Negative for fever.  Cardiovascular:  Negative for chest pain.  Gastrointestinal:  Positive for abdominal pain, diarrhea, nausea and vomiting. Negative for blood in stool.  Neurological:  Positive for weakness. Negative for headaches.    Physical Exam Updated Vital Signs BP 111/66   Pulse (!) 54   Temp 98.2 F (36.8 C) (Oral)   Resp 11   Ht 6' 5 (1.956 m)   Wt 64.7 kg   SpO2 100%   BMI 16.91 kg/m  Physical Exam Vitals and nursing note reviewed.  Constitutional:      General: He is not in acute distress.    Appearance: He is well-developed. He is not ill-appearing or diaphoretic.  HENT:      Head: Normocephalic and atraumatic.  Eyes:     Extraocular Movements: Extraocular movements intact.  Cardiovascular:     Rate and Rhythm: Normal rate and regular rhythm.     Heart sounds: Normal heart sounds.  Pulmonary:     Effort: Pulmonary effort is normal.     Breath sounds: Normal breath sounds.  Abdominal:     Palpations: Abdomen is soft.     Tenderness: There is abdominal tenderness (lower, diffuse).  Skin:    General: Skin is warm and dry.  Neurological:     Mental Status: He is alert.     Comments: CN 3-12 grossly intact. 5/5 strength in all 4 extremities. Grossly normal sensation. Normal finger to nose.      ED Results / Procedures / Treatments   Labs (all labs ordered are listed, but only abnormal results are displayed) Labs Reviewed  COMPREHENSIVE METABOLIC PANEL - Abnormal; Notable for the following components:      Result Value   Potassium 3.3 (*)    Chloride 97 (*)    BUN 24 (*)    Creatinine, Ser 6.62 (*)    Calcium  8.5 (*)    Total Protein 5.6 (*)    Albumin  2.8 (*)    GFR, Estimated 8 (*)    All other components within normal limits  CBC WITH DIFFERENTIAL/PLATELET - Abnormal; Notable for the following components:   RBC 3.55 (*)    Hemoglobin 10.7 (*)    HCT 34.1 (*)    Lymphs Abs 0.3 (*)    All other components within normal limits  LIPASE, BLOOD  URINALYSIS, ROUTINE W REFLEX MICROSCOPIC    EKG EKG Interpretation Date/Time:  Thursday August 23 2023 11:27:51 EST Ventricular Rate:  59 PR Interval:  180 QRS Duration:  109 QT Interval:  440 QTC Calculation: 436 R Axis:   72  Text Interpretation: Sinus rhythm Atrial premature complex no acute ST/T changes Confirmed by Freddi Hamilton [phone]) on 08/23/2023 12:06:32 PM  Radiology No results found.  Procedures Procedures    Medications Ordered in ED Medications  ondansetron  (ZOFRAN ) injection 4 mg (has no administration in time range)  sodium chloride  0.9 % bolus 500 mL (0 mLs Intravenous  Stopped 08/23/23 1141)    ED Course/ Medical Decision Making/ A&P                                 Medical Decision Making Amount and/or Complexity of Data Reviewed Labs: ordered.    Details: CKD.  Mild hypokalemia.  Chronic anemia. Radiology: ordered and independent interpretation performed.    Details: No bowel obstruction.  Cholelithiasis. ECG/medicine tests: ordered and independent interpretation performed.  Details: No ischemia  Risk Prescription drug management.   Patient presents with vomiting for a couple weeks.  He is not having any vomiting here and has been here 5+ hours.  He has some nonspecific abdominal discomfort though it is worse in the lower rather than upper.  CT did not show any obvious acute pathology though did show this nonspecific gallbladder finding and dilated CBD.  However his bilirubin is normal, has some chronic LFT changes that are unchanged from baseline (protein and albumin  rather than AST/ALT).  He does not have any specific right upper quadrant tenderness and there was no Murphy sign or gallbladder wall thickening on the ultrasound.  I suspect this is all chronic and not related to was going on today.  He states he does urinate but rarely and does not have any dysuria.  Ultimately he would like to go home and feels like his generalized weakness is better with a small bolus of fluids.  I discussed he will need to call dialysis today to set up a missed session before he goes back on Saturday.  He otherwise appears stable for discharge.  No anginal symptoms.        Final Clinical Impression(s) / ED Diagnoses Final diagnoses:  Vomiting in adult  Calculus of gallbladder without cholecystitis without obstruction    Rx / DC Orders ED Discharge Orders          Ordered    ondansetron  (ZOFRAN -ODT) 4 MG disintegrating tablet  Every 8 hours PRN        08/23/23 1242              Freddi Hamilton, MD 08/23/23 1253

## 2023-08-25 DIAGNOSIS — N250 Renal osteodystrophy: Secondary | ICD-10-CM | POA: Diagnosis not present

## 2023-08-25 DIAGNOSIS — D631 Anemia in chronic kidney disease: Secondary | ICD-10-CM | POA: Diagnosis not present

## 2023-08-25 DIAGNOSIS — N186 End stage renal disease: Secondary | ICD-10-CM | POA: Diagnosis not present

## 2023-08-25 DIAGNOSIS — Z992 Dependence on renal dialysis: Secondary | ICD-10-CM | POA: Diagnosis not present

## 2023-08-25 DIAGNOSIS — D509 Iron deficiency anemia, unspecified: Secondary | ICD-10-CM | POA: Diagnosis not present

## 2023-08-27 DIAGNOSIS — E1151 Type 2 diabetes mellitus with diabetic peripheral angiopathy without gangrene: Secondary | ICD-10-CM | POA: Diagnosis not present

## 2023-08-27 DIAGNOSIS — E1140 Type 2 diabetes mellitus with diabetic neuropathy, unspecified: Secondary | ICD-10-CM | POA: Diagnosis not present

## 2023-08-28 DIAGNOSIS — D509 Iron deficiency anemia, unspecified: Secondary | ICD-10-CM | POA: Diagnosis not present

## 2023-08-28 DIAGNOSIS — Z992 Dependence on renal dialysis: Secondary | ICD-10-CM | POA: Diagnosis not present

## 2023-08-28 DIAGNOSIS — N186 End stage renal disease: Secondary | ICD-10-CM | POA: Diagnosis not present

## 2023-08-28 DIAGNOSIS — D631 Anemia in chronic kidney disease: Secondary | ICD-10-CM | POA: Diagnosis not present

## 2023-08-28 DIAGNOSIS — N250 Renal osteodystrophy: Secondary | ICD-10-CM | POA: Diagnosis not present

## 2023-09-01 DIAGNOSIS — D509 Iron deficiency anemia, unspecified: Secondary | ICD-10-CM | POA: Diagnosis not present

## 2023-09-01 DIAGNOSIS — N250 Renal osteodystrophy: Secondary | ICD-10-CM | POA: Diagnosis not present

## 2023-09-01 DIAGNOSIS — Z992 Dependence on renal dialysis: Secondary | ICD-10-CM | POA: Diagnosis not present

## 2023-09-01 DIAGNOSIS — N186 End stage renal disease: Secondary | ICD-10-CM | POA: Diagnosis not present

## 2023-09-01 DIAGNOSIS — D631 Anemia in chronic kidney disease: Secondary | ICD-10-CM | POA: Diagnosis not present

## 2023-09-04 DIAGNOSIS — N186 End stage renal disease: Secondary | ICD-10-CM | POA: Diagnosis not present

## 2023-09-04 DIAGNOSIS — I10 Essential (primary) hypertension: Secondary | ICD-10-CM | POA: Diagnosis not present

## 2023-09-04 DIAGNOSIS — N250 Renal osteodystrophy: Secondary | ICD-10-CM | POA: Diagnosis not present

## 2023-09-04 DIAGNOSIS — D631 Anemia in chronic kidney disease: Secondary | ICD-10-CM | POA: Diagnosis not present

## 2023-09-04 DIAGNOSIS — Z992 Dependence on renal dialysis: Secondary | ICD-10-CM | POA: Diagnosis not present

## 2023-09-04 DIAGNOSIS — D509 Iron deficiency anemia, unspecified: Secondary | ICD-10-CM | POA: Diagnosis not present

## 2023-09-04 DIAGNOSIS — K529 Noninfective gastroenteritis and colitis, unspecified: Secondary | ICD-10-CM | POA: Diagnosis not present

## 2023-09-06 DIAGNOSIS — Z992 Dependence on renal dialysis: Secondary | ICD-10-CM | POA: Diagnosis not present

## 2023-09-06 DIAGNOSIS — D509 Iron deficiency anemia, unspecified: Secondary | ICD-10-CM | POA: Diagnosis not present

## 2023-09-06 DIAGNOSIS — D631 Anemia in chronic kidney disease: Secondary | ICD-10-CM | POA: Diagnosis not present

## 2023-09-06 DIAGNOSIS — N186 End stage renal disease: Secondary | ICD-10-CM | POA: Diagnosis not present

## 2023-09-06 DIAGNOSIS — N250 Renal osteodystrophy: Secondary | ICD-10-CM | POA: Diagnosis not present

## 2023-09-11 DIAGNOSIS — D509 Iron deficiency anemia, unspecified: Secondary | ICD-10-CM | POA: Diagnosis not present

## 2023-09-11 DIAGNOSIS — N250 Renal osteodystrophy: Secondary | ICD-10-CM | POA: Diagnosis not present

## 2023-09-11 DIAGNOSIS — N186 End stage renal disease: Secondary | ICD-10-CM | POA: Diagnosis not present

## 2023-09-11 DIAGNOSIS — Z992 Dependence on renal dialysis: Secondary | ICD-10-CM | POA: Diagnosis not present

## 2023-09-11 DIAGNOSIS — D631 Anemia in chronic kidney disease: Secondary | ICD-10-CM | POA: Diagnosis not present

## 2023-09-13 DIAGNOSIS — Z992 Dependence on renal dialysis: Secondary | ICD-10-CM | POA: Diagnosis not present

## 2023-09-13 DIAGNOSIS — D509 Iron deficiency anemia, unspecified: Secondary | ICD-10-CM | POA: Diagnosis not present

## 2023-09-13 DIAGNOSIS — N250 Renal osteodystrophy: Secondary | ICD-10-CM | POA: Diagnosis not present

## 2023-09-13 DIAGNOSIS — N186 End stage renal disease: Secondary | ICD-10-CM | POA: Diagnosis not present

## 2023-09-13 DIAGNOSIS — D631 Anemia in chronic kidney disease: Secondary | ICD-10-CM | POA: Diagnosis not present

## 2023-09-14 DIAGNOSIS — Z992 Dependence on renal dialysis: Secondary | ICD-10-CM | POA: Diagnosis not present

## 2023-09-14 DIAGNOSIS — N186 End stage renal disease: Secondary | ICD-10-CM | POA: Diagnosis not present

## 2023-09-15 DIAGNOSIS — D509 Iron deficiency anemia, unspecified: Secondary | ICD-10-CM | POA: Diagnosis not present

## 2023-09-15 DIAGNOSIS — Z992 Dependence on renal dialysis: Secondary | ICD-10-CM | POA: Diagnosis not present

## 2023-09-15 DIAGNOSIS — N250 Renal osteodystrophy: Secondary | ICD-10-CM | POA: Diagnosis not present

## 2023-09-15 DIAGNOSIS — N186 End stage renal disease: Secondary | ICD-10-CM | POA: Diagnosis not present

## 2023-09-16 DIAGNOSIS — Z992 Dependence on renal dialysis: Secondary | ICD-10-CM | POA: Diagnosis not present

## 2023-09-16 DIAGNOSIS — N186 End stage renal disease: Secondary | ICD-10-CM | POA: Diagnosis not present

## 2023-09-17 DIAGNOSIS — N186 End stage renal disease: Secondary | ICD-10-CM | POA: Diagnosis not present

## 2023-09-17 DIAGNOSIS — Z992 Dependence on renal dialysis: Secondary | ICD-10-CM | POA: Diagnosis not present

## 2023-09-18 DIAGNOSIS — Z992 Dependence on renal dialysis: Secondary | ICD-10-CM | POA: Diagnosis not present

## 2023-09-18 DIAGNOSIS — N186 End stage renal disease: Secondary | ICD-10-CM | POA: Diagnosis not present

## 2023-09-19 DIAGNOSIS — C228 Malignant neoplasm of liver, primary, unspecified as to type: Secondary | ICD-10-CM | POA: Diagnosis not present

## 2023-09-19 DIAGNOSIS — N186 End stage renal disease: Secondary | ICD-10-CM | POA: Diagnosis not present

## 2023-09-19 DIAGNOSIS — Z992 Dependence on renal dialysis: Secondary | ICD-10-CM | POA: Diagnosis not present

## 2023-09-19 DIAGNOSIS — Z515 Encounter for palliative care: Secondary | ICD-10-CM | POA: Diagnosis not present

## 2023-09-19 DIAGNOSIS — C786 Secondary malignant neoplasm of retroperitoneum and peritoneum: Secondary | ICD-10-CM | POA: Diagnosis not present

## 2023-09-20 ENCOUNTER — Encounter (HOSPITAL_COMMUNITY): Payer: Self-pay

## 2023-09-20 ENCOUNTER — Emergency Department (HOSPITAL_COMMUNITY)

## 2023-09-20 ENCOUNTER — Other Ambulatory Visit: Payer: Self-pay

## 2023-09-20 ENCOUNTER — Emergency Department (HOSPITAL_COMMUNITY)
Admission: EM | Admit: 2023-09-20 | Discharge: 2023-09-20 | Disposition: A | Discharge status: Home / Self Care | Attending: Emergency Medicine | Admitting: Emergency Medicine

## 2023-09-20 DIAGNOSIS — K8020 Calculus of gallbladder without cholecystitis without obstruction: Secondary | ICD-10-CM | POA: Diagnosis not present

## 2023-09-20 DIAGNOSIS — Z992 Dependence on renal dialysis: Secondary | ICD-10-CM | POA: Insufficient documentation

## 2023-09-20 DIAGNOSIS — I771 Stricture of artery: Secondary | ICD-10-CM | POA: Diagnosis not present

## 2023-09-20 DIAGNOSIS — R531 Weakness: Secondary | ICD-10-CM | POA: Insufficient documentation

## 2023-09-20 DIAGNOSIS — I120 Hypertensive chronic kidney disease with stage 5 chronic kidney disease or end stage renal disease: Secondary | ICD-10-CM | POA: Diagnosis not present

## 2023-09-20 DIAGNOSIS — K429 Umbilical hernia without obstruction or gangrene: Secondary | ICD-10-CM | POA: Diagnosis not present

## 2023-09-20 DIAGNOSIS — R109 Unspecified abdominal pain: Secondary | ICD-10-CM | POA: Diagnosis not present

## 2023-09-20 DIAGNOSIS — R0602 Shortness of breath: Secondary | ICD-10-CM | POA: Diagnosis not present

## 2023-09-20 DIAGNOSIS — C799 Secondary malignant neoplasm of unspecified site: Secondary | ICD-10-CM | POA: Insufficient documentation

## 2023-09-20 DIAGNOSIS — R112 Nausea with vomiting, unspecified: Secondary | ICD-10-CM | POA: Diagnosis not present

## 2023-09-20 DIAGNOSIS — N186 End stage renal disease: Secondary | ICD-10-CM

## 2023-09-20 DIAGNOSIS — K4090 Unilateral inguinal hernia, without obstruction or gangrene, not specified as recurrent: Secondary | ICD-10-CM | POA: Diagnosis not present

## 2023-09-20 LAB — I-STAT CHEM 8, ED
BUN: 32 mg/dL — ABNORMAL HIGH (ref 8–23)
Calcium, Ion: 1.04 mmol/L — ABNORMAL LOW (ref 1.15–1.40)
Chloride: 98 mmol/L (ref 98–111)
Creatinine, Ser: 8 mg/dL — ABNORMAL HIGH (ref 0.61–1.24)
Glucose, Bld: 85 mg/dL (ref 70–99)
HCT: 32 % — ABNORMAL LOW (ref 39.0–52.0)
Hemoglobin: 10.9 g/dL — ABNORMAL LOW (ref 13.0–17.0)
Potassium: 3.5 mmol/L (ref 3.5–5.1)
Sodium: 137 mmol/L (ref 135–145)
TCO2: 28 mmol/L (ref 22–32)

## 2023-09-20 LAB — COMPREHENSIVE METABOLIC PANEL
ALT: 9 U/L (ref 0–44)
AST: 22 U/L (ref 15–41)
Albumin: 2.8 g/dL — ABNORMAL LOW (ref 3.5–5.0)
Alkaline Phosphatase: 63 U/L (ref 38–126)
Anion gap: 14 (ref 5–15)
BUN: 32 mg/dL — ABNORMAL HIGH (ref 8–23)
CO2: 26 mmol/L (ref 22–32)
Calcium: 8.7 mg/dL — ABNORMAL LOW (ref 8.9–10.3)
Chloride: 97 mmol/L — ABNORMAL LOW (ref 98–111)
Creatinine, Ser: 7.54 mg/dL — ABNORMAL HIGH (ref 0.61–1.24)
GFR, Estimated: 7 mL/min — ABNORMAL LOW (ref >60)
Glucose, Bld: 90 mg/dL (ref 70–99)
Potassium: 3.4 mmol/L — ABNORMAL LOW (ref 3.5–5.1)
Sodium: 137 mmol/L (ref 135–145)
Total Bilirubin: 1.3 mg/dL — ABNORMAL HIGH (ref 0.0–1.2)
Total Protein: 5.7 g/dL — ABNORMAL LOW (ref 6.5–8.1)

## 2023-09-20 LAB — CBC WITH DIFFERENTIAL/PLATELET
Abs Immature Granulocytes: 0.01 10*3/uL (ref 0.00–0.07)
Basophils Absolute: 0 10*3/uL (ref 0.0–0.1)
Basophils Relative: 0 %
Eosinophils Absolute: 0.1 10*3/uL (ref 0.0–0.5)
Eosinophils Relative: 2 %
HCT: 33.2 % — ABNORMAL LOW (ref 39.0–52.0)
Hemoglobin: 10.4 g/dL — ABNORMAL LOW (ref 13.0–17.0)
Immature Granulocytes: 0 %
Lymphocytes Relative: 8 %
Lymphs Abs: 0.4 10*3/uL — ABNORMAL LOW (ref 0.7–4.0)
MCH: 29.7 pg (ref 26.0–34.0)
MCHC: 31.3 g/dL (ref 30.0–36.0)
MCV: 94.9 fL (ref 80.0–100.0)
Monocytes Absolute: 0.4 10*3/uL (ref 0.1–1.0)
Monocytes Relative: 8 %
Neutro Abs: 3.7 10*3/uL (ref 1.7–7.7)
Neutrophils Relative %: 82 %
Platelets: 156 10*3/uL (ref 150–400)
RBC: 3.5 MIL/uL — ABNORMAL LOW (ref 4.22–5.81)
RDW: 14.8 % (ref 11.5–15.5)
WBC: 4.5 10*3/uL (ref 4.0–10.5)
nRBC: 0 % (ref 0.0–0.2)

## 2023-09-20 LAB — LACTIC ACID, PLASMA
Lactic Acid, Venous: 1.4 mmol/L (ref 0.5–1.9)
Lactic Acid, Venous: 1.1 mmol/L (ref 0.5–1.9)

## 2023-09-20 LAB — LIPASE, BLOOD: Lipase: 29 U/L (ref 11–51)

## 2023-09-20 MED ORDER — PANTOPRAZOLE SODIUM 40 MG IV SOLR
40.0000 mg | Freq: Once | INTRAVENOUS | Status: AC
  Administered 2023-09-20: 40 mg via INTRAVENOUS
  Filled 2023-09-20: qty 10

## 2023-09-20 MED ORDER — IOHEXOL 300 MG/ML  SOLN
100.0000 mL | Freq: Once | INTRAMUSCULAR | Status: AC | PRN
  Administered 2023-09-20: 100 mL via INTRAVENOUS

## 2023-09-20 MED ORDER — SODIUM CHLORIDE 0.9 % IV BOLUS
500.0000 mL | Freq: Once | INTRAVENOUS | Status: AC
  Administered 2023-09-20: 500 mL via INTRAVENOUS

## 2023-09-20 NOTE — ED Notes (Signed)
 Patient transported to CT

## 2023-09-20 NOTE — ED Provider Notes (Signed)
 [place] EMERGENCY DEPARTMENT AT [facility] Provider Note   CSN: [id number] Arrival date & time: 09/20/23  1250     History  Chief Complaint  Patient presents with   Hypotension    Allen Davenport is a 83 y.o. male.  Patient is a dialysis patient for the last 4 years.  He has not had dialysis since Saturday because he has been very nauseated and having significant dry heaves.  Patient came in today for weakness and hypotension  The history is provided by the patient and medical records.  Emesis Severity:  Moderate Timing:  Constant Quality:  Unable to specify Able to tolerate:  Liquids Progression:  Unchanged Chronicity:  Recurrent Recent urination:  Absent Relieved by:  Nothing Worsened by:  Nothing Associated symptoms: no cough, no diarrhea and no headaches   Risk factors: no alcohol use        Home Medications Prior to Admission medications   Medication Sig Start Date End Date Taking? Authorizing Provider  amLODipine (NORVASC) 10 MG tablet Take 1 tablet every day by oral route.    [provider]  ammonium lactate (AMLACTIN) 12 % cream Apply topically. 06/19/23   [provider]  atorvastatin (LIPITOR) 80 MG tablet Take 80 mg by mouth daily at 6 PM.  11/04/13   [provider]  calcium acetate (PHOSLO) 667 MG capsule Take by mouth. 11/21/22   [provider]  Calcium Acetate 667 MG TABS Take 667-2,001 mg by mouth See admin instructions. Take 2001 mg with each meal and 667 mg with each snack    [provider]  Calcium Carb-Cholecalciferol (CALCIUM 500 + D PO) Take 1 tablet by mouth in the morning, at noon, and at bedtime.    [provider]  carvedilol (COREG) 6.25 MG tablet Take 6.25 mg by mouth 2 (two) times daily. 06/01/21   [provider]  diphenoxylate-atropine (LOMOTIL) 2.5-0.025 MG tablet Take 2 tablets by mouth 4 (four) times daily as needed for diarrhea or loose stools. 08/14/23    Doreatha Massed, MD  fexofenadine (ALLEGRA) 180 MG tablet Take 180 mg by mouth daily. 03/02/22   [provider]  furosemide (LASIX) 40 MG tablet Take 40 mg by mouth every morning. 06/25/19   [provider]  mirtazapine (REMERON) 7.5 MG tablet Take 7.5 mg by mouth at bedtime. 03/07/22   [provider]  Omega-3 Fatty Acids (FISH OIL) 1000 MG CAPS Take 1,000 mg by mouth daily.     [provider]  ondansetron (ZOFRAN-ODT) 4 MG disintegrating tablet Take 1 tablet (4 mg total) by mouth every 8 (eight) hours as needed for nausea or vomiting. 08/23/23   Pricilla Loveless, MD  peginterferon alfa-2a (PEGASYS) 180 MCG/ML injection Inject 0.75 mLs (135 mcg total) into the skin every 14 (fourteen) days. 06/22/22   Doreatha Massed, MD  Riboflavin (B2) 100 MG TABS Take 100 mg by mouth daily.    [provider]  sodium bicarbonate 650 MG tablet Take 650 mg by mouth 3 (three) times daily.    [provider]  sodium bicarbonate 650 MG tablet Take 1 tablet by mouth 3 (three) times daily.    [provider]  TUBERCULIN SYR 1CC/27GX1/2" (B-D TB SYRINGE 1CC/27GX1/2") 27G X 1/2" 1 ML MISC Use to administer pegasys every 14 days 06/22/22   Doreatha Massed, MD      Allergies    Patient has no known allergies.    Review of Systems   Review  of Systems  Constitutional:  Negative for appetite change and fatigue.  HENT:  Negative for congestion, ear discharge and sinus pressure.   Eyes:  Negative for discharge.  Respiratory:  Negative for cough.   Cardiovascular:  Negative for chest pain.  Gastrointestinal:  Positive for vomiting. Negative for diarrhea.  Genitourinary:  Negative for frequency and hematuria.  Musculoskeletal:  Negative for back pain.  Skin:  Negative for rash.  Neurological:  Negative for seizures and headaches.  Psychiatric/Behavioral:  Negative for hallucinations.     Physical Exam Updated Vital Signs BP (!) 113/57   Pulse  62   Temp 98.6 F (37 C)   Resp 19   Ht 6\' 5"  (1.956 m)   Wt 64.7 kg   SpO2 100%   BMI 16.91 kg/m  Physical Exam Vitals reviewed.  Constitutional:      Appearance: He is well-developed.  HENT:     Head: Normocephalic.     Nose: Nose normal.  Eyes:     General: No scleral icterus.    Conjunctiva/sclera: Conjunctivae normal.  Neck:     Thyroid: No thyromegaly.  Cardiovascular:     Rate and Rhythm: Normal rate and regular rhythm.     Heart sounds: No murmur heard.    No friction rub. No gallop.  Pulmonary:     Breath sounds: No stridor. No wheezing or rales.  Chest:     Chest wall: No tenderness.  Abdominal:     General: There is no distension.     Tenderness: There is abdominal tenderness. There is no rebound.  Musculoskeletal:        General: Normal range of motion.     Cervical back: Neck supple.  Lymphadenopathy:     Cervical: No cervical adenopathy.  Skin:    Findings: No erythema or rash.  Neurological:     Mental Status: He is alert and oriented to person, place, and time.     Motor: No abnormal muscle tone.     Coordination: Coordination normal.  Psychiatric:        Behavior: Behavior normal.     ED Results / Procedures / Treatments   Labs (all labs ordered are listed, but only abnormal results are displayed) Labs Reviewed  CBC WITH DIFFERENTIAL/PLATELET - Abnormal; Notable for the following components:      Result Value   RBC 3.50 (*)    Hemoglobin 10.4 (*)    HCT 33.2 (*)    Lymphs Abs 0.4 (*)    All other components within normal limits  COMPREHENSIVE METABOLIC PANEL - Abnormal; Notable for the following components:   Potassium 3.4 (*)    Chloride 97 (*)    BUN 32 (*)    Creatinine, Ser 7.54 (*)    Calcium 8.7 (*)    Total Protein 5.7 (*)    Albumin 2.8 (*)    Total Bilirubin 1.3 (*)    GFR, Estimated 7 (*)    All other components within normal limits  I-STAT CHEM 8, ED - Abnormal; Notable for the following components:   BUN 32 (*)     Creatinine, Ser 8.00 (*)    Calcium, Ion 1.04 (*)    Hemoglobin 10.9 (*)    HCT 32.0 (*)    All other components within normal limits  LIPASE, BLOOD  LACTIC ACID, PLASMA  LACTIC ACID, PLASMA    EKG None  Radiology No results found.  Procedures Procedures    Medications Ordered in ED Medications  pantoprazole (  PROTONIX) injection 40 mg (has no administration in time range)  sodium chloride 0.9 % bolus 500 mL (500 mLs Intravenous New Bag/Given 09/20/23 1432)    ED Course/ Medical Decision Making/ A&P                                 Medical Decision Making Amount and/or Complexity of Data Reviewed Labs: ordered. Radiology: ordered. ECG/medicine tests: ordered.  Risk Prescription drug management.   Patient with vomiting and past due on his dialysis.  CT scan pending.  Disposition will be determined by my colleague        Final Clinical Impression(s) / ED Diagnoses Final diagnoses:  None    Rx / DC Orders ED Discharge Orders     None         Bethann Berkshire, MD 09/23/23 1202

## 2023-09-20 NOTE — ED Triage Notes (Signed)
 Pt arrived via POV c/o hypotension, and feeling sick. Pt reports he missed his Tuesday and Thursday Dialysis appointments this weak due to not feeling well. Pt reports his Systolic BP has dropped form 120 to 77. Pts BP in Triage is 84/61.

## 2023-09-20 NOTE — ED Notes (Signed)
 See triage notes. Ble swelling noted. A/o. Color wnl. Left upper arm fistula noted and intact. Nad. Denies dizziness/pain. Pt c/o "a little bit" of weakness. No resp distress or shob noted.

## 2023-09-20 NOTE — ED Provider Notes (Signed)
  Physical Exam  BP 115/75   Pulse (!) 55   Temp 98.6 F (37 C)   Resp 16   Ht 6\' 5"  (1.956 m)   Wt 64.7 kg   SpO2 98%   BMI 16.91 kg/m   Physical Exam  Procedures  Procedures  ED Course / MDM    Medical Decision Making Amount and/or Complexity of Data Reviewed Labs: ordered. Radiology: ordered. ECG/medicine tests: ordered.  Risk Prescription drug management.   Received in signout.  Metastatic cancer with abdominal pain.  On chemotherapy.  Initial hypotension improved.  Feels somewhat better.  Eager to eat something.  Previous provider discussed with nephrology and plan for outpatient dialysis.  Appears stable for discharge.       Benjiman Core, MD 09/20/23 [phone]

## 2023-09-20 NOTE — Discharge Instructions (Signed)
 Follow-up with your dialysis.  Take your medicines to help with the vomiting.

## 2023-09-21 DIAGNOSIS — N186 End stage renal disease: Secondary | ICD-10-CM | POA: Diagnosis not present

## 2023-09-21 DIAGNOSIS — Z992 Dependence on renal dialysis: Secondary | ICD-10-CM | POA: Diagnosis not present

## 2023-09-22 DIAGNOSIS — N250 Renal osteodystrophy: Secondary | ICD-10-CM | POA: Diagnosis not present

## 2023-09-22 DIAGNOSIS — D509 Iron deficiency anemia, unspecified: Secondary | ICD-10-CM | POA: Diagnosis not present

## 2023-09-22 DIAGNOSIS — Z992 Dependence on renal dialysis: Secondary | ICD-10-CM | POA: Diagnosis not present

## 2023-09-22 DIAGNOSIS — N186 End stage renal disease: Secondary | ICD-10-CM | POA: Diagnosis not present

## 2023-09-23 DIAGNOSIS — Z992 Dependence on renal dialysis: Secondary | ICD-10-CM | POA: Diagnosis not present

## 2023-09-23 DIAGNOSIS — N186 End stage renal disease: Secondary | ICD-10-CM | POA: Diagnosis not present

## 2023-09-24 DIAGNOSIS — Z992 Dependence on renal dialysis: Secondary | ICD-10-CM | POA: Diagnosis not present

## 2023-09-24 DIAGNOSIS — N186 End stage renal disease: Secondary | ICD-10-CM | POA: Diagnosis not present

## 2023-09-25 DIAGNOSIS — N186 End stage renal disease: Secondary | ICD-10-CM | POA: Diagnosis not present

## 2023-09-25 DIAGNOSIS — N250 Renal osteodystrophy: Secondary | ICD-10-CM | POA: Diagnosis not present

## 2023-09-25 DIAGNOSIS — D509 Iron deficiency anemia, unspecified: Secondary | ICD-10-CM | POA: Diagnosis not present

## 2023-09-25 DIAGNOSIS — Z992 Dependence on renal dialysis: Secondary | ICD-10-CM | POA: Diagnosis not present

## 2023-09-26 DIAGNOSIS — Z992 Dependence on renal dialysis: Secondary | ICD-10-CM | POA: Diagnosis not present

## 2023-09-26 DIAGNOSIS — N186 End stage renal disease: Secondary | ICD-10-CM | POA: Diagnosis not present

## 2023-09-27 DIAGNOSIS — N186 End stage renal disease: Secondary | ICD-10-CM | POA: Diagnosis not present

## 2023-09-27 DIAGNOSIS — Z992 Dependence on renal dialysis: Secondary | ICD-10-CM | POA: Diagnosis not present

## 2023-09-28 DIAGNOSIS — N186 End stage renal disease: Secondary | ICD-10-CM | POA: Diagnosis not present

## 2023-09-28 DIAGNOSIS — Z992 Dependence on renal dialysis: Secondary | ICD-10-CM | POA: Diagnosis not present

## 2023-09-29 DIAGNOSIS — Z992 Dependence on renal dialysis: Secondary | ICD-10-CM | POA: Diagnosis not present

## 2023-09-29 DIAGNOSIS — N186 End stage renal disease: Secondary | ICD-10-CM | POA: Diagnosis not present

## 2023-09-29 DIAGNOSIS — N250 Renal osteodystrophy: Secondary | ICD-10-CM | POA: Diagnosis not present

## 2023-09-29 DIAGNOSIS — D509 Iron deficiency anemia, unspecified: Secondary | ICD-10-CM | POA: Diagnosis not present

## 2023-10-03 ENCOUNTER — Other Ambulatory Visit: Payer: Self-pay

## 2023-10-03 ENCOUNTER — Inpatient Hospital Stay (HOSPITAL_COMMUNITY)

## 2023-10-03 ENCOUNTER — Inpatient Hospital Stay (HOSPITAL_COMMUNITY)
Admission: EM | Admit: 2023-10-03 | Discharge: 2023-10-08 | DRG: 640 | Disposition: A | Discharge status: Hospice | Attending: Internal Medicine | Admitting: Internal Medicine

## 2023-10-03 ENCOUNTER — Emergency Department (HOSPITAL_COMMUNITY)

## 2023-10-03 ENCOUNTER — Encounter (HOSPITAL_COMMUNITY): Payer: Self-pay

## 2023-10-03 DIAGNOSIS — D631 Anemia in chronic kidney disease: Secondary | ICD-10-CM | POA: Diagnosis present

## 2023-10-03 DIAGNOSIS — Z7189 Other specified counseling: Secondary | ICD-10-CM | POA: Diagnosis not present

## 2023-10-03 DIAGNOSIS — C7A098 Malignant carcinoid tumors of other sites: Secondary | ICD-10-CM | POA: Diagnosis not present

## 2023-10-03 DIAGNOSIS — E869 Volume depletion, unspecified: Principal | ICD-10-CM | POA: Diagnosis present

## 2023-10-03 DIAGNOSIS — N186 End stage renal disease: Secondary | ICD-10-CM | POA: Diagnosis not present

## 2023-10-03 DIAGNOSIS — R54 Age-related physical debility: Secondary | ICD-10-CM | POA: Diagnosis present

## 2023-10-03 DIAGNOSIS — R64 Cachexia: Secondary | ICD-10-CM | POA: Diagnosis present

## 2023-10-03 DIAGNOSIS — N2581 Secondary hyperparathyroidism of renal origin: Secondary | ICD-10-CM | POA: Diagnosis present

## 2023-10-03 DIAGNOSIS — R197 Diarrhea, unspecified: Secondary | ICD-10-CM | POA: Diagnosis present

## 2023-10-03 DIAGNOSIS — C7A00 Malignant carcinoid tumor of unspecified site: Secondary | ICD-10-CM | POA: Diagnosis present

## 2023-10-03 DIAGNOSIS — R627 Adult failure to thrive: Secondary | ICD-10-CM | POA: Diagnosis present

## 2023-10-03 DIAGNOSIS — Z8546 Personal history of malignant neoplasm of prostate: Secondary | ICD-10-CM

## 2023-10-03 DIAGNOSIS — K8050 Calculus of bile duct without cholangitis or cholecystitis without obstruction: Secondary | ICD-10-CM | POA: Diagnosis present

## 2023-10-03 DIAGNOSIS — Z905 Acquired absence of kidney: Secondary | ICD-10-CM

## 2023-10-03 DIAGNOSIS — E785 Hyperlipidemia, unspecified: Secondary | ICD-10-CM | POA: Diagnosis present

## 2023-10-03 DIAGNOSIS — E872 Acidosis, unspecified: Secondary | ICD-10-CM | POA: Diagnosis not present

## 2023-10-03 DIAGNOSIS — Z681 Body mass index (BMI) 19 or less, adult: Secondary | ICD-10-CM

## 2023-10-03 DIAGNOSIS — C7B04 Secondary carcinoid tumors of peritoneum: Secondary | ICD-10-CM | POA: Diagnosis present

## 2023-10-03 DIAGNOSIS — I120 Hypertensive chronic kidney disease with stage 5 chronic kidney disease or end stage renal disease: Secondary | ICD-10-CM | POA: Diagnosis not present

## 2023-10-03 DIAGNOSIS — R680 Hypothermia, not associated with low environmental temperature: Secondary | ICD-10-CM | POA: Diagnosis present

## 2023-10-03 DIAGNOSIS — K8020 Calculus of gallbladder without cholecystitis without obstruction: Secondary | ICD-10-CM | POA: Diagnosis not present

## 2023-10-03 DIAGNOSIS — K921 Melena: Secondary | ICD-10-CM | POA: Diagnosis present

## 2023-10-03 DIAGNOSIS — I499 Cardiac arrhythmia, unspecified: Secondary | ICD-10-CM | POA: Diagnosis not present

## 2023-10-03 DIAGNOSIS — T68XXXA Hypothermia, initial encounter: Secondary | ICD-10-CM

## 2023-10-03 DIAGNOSIS — K838 Other specified diseases of biliary tract: Secondary | ICD-10-CM | POA: Diagnosis not present

## 2023-10-03 DIAGNOSIS — Z515 Encounter for palliative care: Secondary | ICD-10-CM | POA: Diagnosis not present

## 2023-10-03 DIAGNOSIS — Z992 Dependence on renal dialysis: Secondary | ICD-10-CM | POA: Diagnosis not present

## 2023-10-03 DIAGNOSIS — I9589 Other hypotension: Secondary | ICD-10-CM

## 2023-10-03 DIAGNOSIS — R000 Tachycardia, unspecified: Secondary | ICD-10-CM | POA: Diagnosis present

## 2023-10-03 DIAGNOSIS — C7B02 Secondary carcinoid tumors of liver: Secondary | ICD-10-CM | POA: Diagnosis present

## 2023-10-03 DIAGNOSIS — R531 Weakness: Secondary | ICD-10-CM | POA: Diagnosis not present

## 2023-10-03 DIAGNOSIS — Z66 Do not resuscitate: Secondary | ICD-10-CM | POA: Diagnosis not present

## 2023-10-03 DIAGNOSIS — C7B00 Secondary carcinoid tumors, unspecified site: Secondary | ICD-10-CM | POA: Diagnosis not present

## 2023-10-03 DIAGNOSIS — W19XXXA Unspecified fall, initial encounter: Secondary | ICD-10-CM

## 2023-10-03 DIAGNOSIS — C787 Secondary malignant neoplasm of liver and intrahepatic bile duct: Secondary | ICD-10-CM | POA: Diagnosis not present

## 2023-10-03 DIAGNOSIS — W010XXA Fall on same level from slipping, tripping and stumbling without subsequent striking against object, initial encounter: Secondary | ICD-10-CM | POA: Diagnosis present

## 2023-10-03 DIAGNOSIS — Y92009 Unspecified place in unspecified non-institutional (private) residence as the place of occurrence of the external cause: Secondary | ICD-10-CM

## 2023-10-03 DIAGNOSIS — I951 Orthostatic hypotension: Secondary | ICD-10-CM | POA: Diagnosis present

## 2023-10-03 DIAGNOSIS — K625 Hemorrhage of anus and rectum: Secondary | ICD-10-CM | POA: Diagnosis not present

## 2023-10-03 DIAGNOSIS — D649 Anemia, unspecified: Secondary | ICD-10-CM | POA: Diagnosis not present

## 2023-10-03 DIAGNOSIS — Z85528 Personal history of other malignant neoplasm of kidney: Secondary | ICD-10-CM

## 2023-10-03 DIAGNOSIS — C220 Liver cell carcinoma: Secondary | ICD-10-CM | POA: Diagnosis not present

## 2023-10-03 DIAGNOSIS — Z8249 Family history of ischemic heart disease and other diseases of the circulatory system: Secondary | ICD-10-CM | POA: Diagnosis not present

## 2023-10-03 DIAGNOSIS — I959 Hypotension, unspecified: Secondary | ICD-10-CM | POA: Diagnosis present

## 2023-10-03 DIAGNOSIS — R4182 Altered mental status, unspecified: Secondary | ICD-10-CM | POA: Diagnosis not present

## 2023-10-03 DIAGNOSIS — I10 Essential (primary) hypertension: Secondary | ICD-10-CM | POA: Diagnosis present

## 2023-10-03 DIAGNOSIS — Z79899 Other long term (current) drug therapy: Secondary | ICD-10-CM

## 2023-10-03 DIAGNOSIS — R932 Abnormal findings on diagnostic imaging of liver and biliary tract: Secondary | ICD-10-CM | POA: Diagnosis not present

## 2023-10-03 LAB — C DIFFICILE QUICK SCREEN W PCR REFLEX
C Diff antigen: NEGATIVE
C Diff interpretation: NOT DETECTED
C Diff toxin: NEGATIVE

## 2023-10-03 LAB — BLOOD GAS, VENOUS
Acid-base deficit: 7.9 mmol/L — ABNORMAL HIGH (ref 0.0–2.0)
Bicarbonate: 18.4 mmol/L — ABNORMAL LOW (ref 20.0–28.0)
Drawn by: 7012
O2 Saturation: 37.6 %
Patient temperature: 36.8
pCO2, Ven: 40 mmHg — ABNORMAL LOW (ref 44–60)
pH, Ven: 7.27 (ref 7.25–7.43)
pO2, Ven: <31 mmHg — CL (ref 32–45)

## 2023-10-03 LAB — CBC WITH DIFFERENTIAL/PLATELET
Abs Immature Granulocytes: 0.03 10*3/uL (ref 0.00–0.07)
Basophils Absolute: 0 10*3/uL (ref 0.0–0.1)
Basophils Relative: 0 %
Eosinophils Absolute: 0 10*3/uL (ref 0.0–0.5)
Eosinophils Relative: 0 %
HCT: 38.5 % — ABNORMAL LOW (ref 39.0–52.0)
Hemoglobin: 11.9 g/dL — ABNORMAL LOW (ref 13.0–17.0)
Immature Granulocytes: 0 %
Lymphocytes Relative: 2 %
Lymphs Abs: 0.2 10*3/uL — ABNORMAL LOW (ref 0.7–4.0)
MCH: 30 pg (ref 26.0–34.0)
MCHC: 30.9 g/dL (ref 30.0–36.0)
MCV: 97 fL (ref 80.0–100.0)
Monocytes Absolute: 0.3 10*3/uL (ref 0.1–1.0)
Monocytes Relative: 4 %
Neutro Abs: 7.3 10*3/uL (ref 1.7–7.7)
Neutrophils Relative %: 94 %
Platelets: 220 10*3/uL (ref 150–400)
RBC: 3.97 MIL/uL — ABNORMAL LOW (ref 4.22–5.81)
RDW: 15 % (ref 11.5–15.5)
WBC: 7.8 10*3/uL (ref 4.0–10.5)
nRBC: 0 % (ref 0.0–0.2)

## 2023-10-03 LAB — LIPASE, BLOOD: Lipase: 25 U/L (ref 11–51)

## 2023-10-03 LAB — T4, FREE: Free T4: 0.97 ng/dL (ref 0.61–1.12)

## 2023-10-03 LAB — COMPREHENSIVE METABOLIC PANEL
ALT: 12 U/L (ref 0–44)
AST: 43 U/L — ABNORMAL HIGH (ref 15–41)
Albumin: 3.2 g/dL — ABNORMAL LOW (ref 3.5–5.0)
Alkaline Phosphatase: 76 U/L (ref 38–126)
Anion gap: 25 — ABNORMAL HIGH (ref 5–15)
BUN: 36 mg/dL — ABNORMAL HIGH (ref 8–23)
CO2: 20 mmol/L — ABNORMAL LOW (ref 22–32)
Calcium: 9.1 mg/dL (ref 8.9–10.3)
Chloride: 94 mmol/L — ABNORMAL LOW (ref 98–111)
Creatinine, Ser: 7.05 mg/dL — ABNORMAL HIGH (ref 0.61–1.24)
GFR, Estimated: 7 mL/min — ABNORMAL LOW (ref >60)
Glucose, Bld: 108 mg/dL — ABNORMAL HIGH (ref 70–99)
Potassium: 3.9 mmol/L (ref 3.5–5.1)
Sodium: 139 mmol/L (ref 135–145)
Total Bilirubin: 2 mg/dL — ABNORMAL HIGH (ref 0.0–1.2)
Total Protein: 6.4 g/dL — ABNORMAL LOW (ref 6.5–8.1)

## 2023-10-03 LAB — TSH: TSH: 8.445 u[IU]/mL — ABNORMAL HIGH (ref 0.350–4.500)

## 2023-10-03 LAB — RESP PANEL BY RT-PCR (RSV, FLU A&B, COVID)  RVPGX2
Influenza A by PCR: NEGATIVE
Influenza B by PCR: NEGATIVE
Resp Syncytial Virus by PCR: NEGATIVE
SARS Coronavirus 2 by RT PCR: NEGATIVE

## 2023-10-03 LAB — VITAMIN B12: Vitamin B-12: 782 pg/mL (ref 180–914)

## 2023-10-03 LAB — LACTIC ACID, PLASMA
Lactic Acid, Venous: 7.5 mmol/L (ref 0.5–1.9)
Lactic Acid, Venous: 5 mmol/L (ref 0.5–1.9)

## 2023-10-03 LAB — PROCALCITONIN: Procalcitonin: 0.22 ng/mL

## 2023-10-03 LAB — FOLATE: Folate: 4.6 ng/mL — ABNORMAL LOW (ref >5.9)

## 2023-10-03 LAB — CK: Total CK: 74 U/L (ref 49–397)

## 2023-10-03 MED ORDER — SODIUM CHLORIDE 0.9 % IV SOLN
2.0000 g | INTRAVENOUS | Status: DC
  Administered 2023-10-04: 2 g via INTRAVENOUS
  Filled 2023-10-03: qty 12.5

## 2023-10-03 MED ORDER — PANTOPRAZOLE SODIUM 40 MG IV SOLR
40.0000 mg | Freq: Two times a day (BID) | INTRAVENOUS | Status: DC
  Administered 2023-10-03 – 2023-10-05 (×4): 40 mg via INTRAVENOUS
  Filled 2023-10-03 (×4): qty 10

## 2023-10-03 MED ORDER — SODIUM CHLORIDE 0.9 % IV SOLN: INTRAVENOUS | Status: AC

## 2023-10-03 MED ORDER — ACETAMINOPHEN 650 MG RE SUPP: 650.0000 mg | Freq: Four times a day (QID) | RECTAL | Status: DC | PRN

## 2023-10-03 MED ORDER — SODIUM CHLORIDE 0.9 % IV SOLN
2.0000 g | Freq: Once | INTRAVENOUS | Status: AC
  Administered 2023-10-03: 2 g via INTRAVENOUS
  Filled 2023-10-03: qty 12.5

## 2023-10-03 MED ORDER — SODIUM CHLORIDE 0.9 % IV BOLUS
500.0000 mL | Freq: Once | INTRAVENOUS | Status: AC
  Administered 2023-10-03: 500 mL via INTRAVENOUS

## 2023-10-03 MED ORDER — PROCHLORPERAZINE EDISYLATE 10 MG/2ML IJ SOLN
5.0000 mg | Freq: Four times a day (QID) | INTRAMUSCULAR | Status: DC
  Administered 2023-10-03 – 2023-10-06 (×11): 5 mg via INTRAVENOUS
  Filled 2023-10-03 (×12): qty 2

## 2023-10-03 MED ORDER — MIRTAZAPINE 15 MG PO TABS
7.5000 mg | ORAL_TABLET | Freq: Every day | ORAL | Status: DC
  Administered 2023-10-03 – 2023-10-04 (×2): 7.5 mg via ORAL
  Filled 2023-10-03 (×2): qty 1

## 2023-10-03 MED ORDER — IOHEXOL 300 MG/ML  SOLN
100.0000 mL | Freq: Once | INTRAMUSCULAR | Status: AC | PRN
  Administered 2023-10-03: 100 mL via INTRAVENOUS

## 2023-10-03 MED ORDER — ACETAMINOPHEN 325 MG PO TABS: 650.0000 mg | ORAL_TABLET | Freq: Four times a day (QID) | ORAL | Status: DC | PRN

## 2023-10-03 MED ORDER — B2 100 MG PO TABS: 100.0000 mg | ORAL_TABLET | Freq: Every day | ORAL | Status: DC

## 2023-10-03 MED ORDER — CHLORHEXIDINE GLUCONATE CLOTH 2 % EX PADS
6.0000 | MEDICATED_PAD | Freq: Every day | CUTANEOUS | Status: DC
  Administered 2023-10-03 – 2023-10-08 (×5): 6 via TOPICAL

## 2023-10-03 MED ORDER — HEPARIN SODIUM (PORCINE) 5000 UNIT/ML IJ SOLN
5000.0000 [IU] | Freq: Three times a day (TID) | INTRAMUSCULAR | Status: DC
  Administered 2023-10-03 – 2023-10-04 (×3): 5000 [IU] via SUBCUTANEOUS
  Filled 2023-10-03 (×4): qty 1

## 2023-10-03 MED ORDER — VANCOMYCIN HCL 750 MG/150ML IV SOLN
750.0000 mg | INTRAVENOUS | Status: DC
  Administered 2023-10-04: 750 mg via INTRAVENOUS
  Filled 2023-10-03: qty 150

## 2023-10-03 MED ORDER — VANCOMYCIN HCL 1250 MG/250ML IV SOLN
1250.0000 mg | Freq: Once | INTRAVENOUS | Status: AC
  Administered 2023-10-03: 1250 mg via INTRAVENOUS
  Filled 2023-10-03: qty 250

## 2023-10-03 MED ORDER — SODIUM BICARBONATE 650 MG PO TABS
650.0000 mg | ORAL_TABLET | Freq: Three times a day (TID) | ORAL | Status: DC
  Administered 2023-10-03 – 2023-10-05 (×6): 650 mg via ORAL
  Filled 2023-10-03 (×6): qty 1

## 2023-10-03 NOTE — ED Provider Notes (Signed)
 [place] EMERGENCY DEPARTMENT AT [facility] Provider Note   CSN: [id number] Arrival date & time: 10/03/23  1115     History  Chief Complaint  Patient presents with   Allen Davenport is a 83 y.o. male.  Pt is a 83 yo male with pmhx significant for ESRD on HD (Tu, Th, Sat), metastatic carcinoid (neuroendocrine) tumor, htn, hld, chf, .  Pt has not been feeling well for the past few days.  Pt has had diarrhea.  Pt last saw oncology in January and his disease is progressing.  Oncology said a referral for palliative care has been ordered.  Wife said someone came to their house, but no decisions have been made.  Pt did not go to dialysis yesterday because he was too weak.  He fell today, but denies hitting his head.  BP 70s upon arrival.         Home Medications Prior to Admission medications   Medication Sig Start Date End Date Taking? Authorizing Provider  amLODipine (NORVASC) 2.5 MG tablet Take 2.5 mg by mouth daily. 09/03/23  Yes [provider]  Calcium Carb-Cholecalciferol (CALCIUM 600 + D PO) Take 1 tablet by mouth daily.   Yes [provider]  diphenoxylate-atropine (LOMOTIL) 2.5-0.025 MG tablet Take 2 tablets by mouth 4 (four) times daily as needed for diarrhea or loose stools. 08/14/23  Yes Doreatha Massed, MD  fexofenadine (ALLEGRA) 180 MG tablet Take 180 mg by mouth daily. 03/02/22  Yes [provider]  furosemide (LASIX) 40 MG tablet Take 40 mg by mouth every morning. 06/25/19  Yes [provider]  mirtazapine (REMERON) 7.5 MG tablet Take 7.5 mg by mouth at bedtime. 03/07/22  Yes [provider]  ondansetron (ZOFRAN) 4 MG tablet Take 4 mg by mouth every 8 (eight) hours as needed. 09/19/23  Yes [provider]  Riboflavin (B2) 100 MG TABS Take 100 mg by mouth daily.   Yes [provider]  sodium bicarbonate 650 MG tablet Take 1 tablet by mouth 3 (three) times daily.   Yes [provider]   TUBERCULIN SYR 1CC/27GX1/2" (B-D TB SYRINGE 1CC/27GX1/2") 27G X 1/2" 1 ML MISC Use to administer pegasys every 14 days 06/22/22   Doreatha Massed, MD      Allergies    Patient has no known allergies.    Review of Systems   Review of Systems  Neurological:  Positive for weakness.  All other systems reviewed and are negative.   Physical Exam Updated Vital Signs BP 91/66   Pulse 97   Temp (!) 96.9 F (36.1 C) (Rectal)   Resp (!) 27   Ht 6\' 5"  (1.956 m)   Wt 64.7 kg   SpO2 91%   BMI 16.91 kg/m  Physical Exam Vitals and nursing note reviewed.  Constitutional:      Appearance: Normal appearance.  HENT:     Head: Normocephalic and atraumatic.     Right Ear: External ear normal.     Left Ear: External ear normal.     Nose: Nose normal.     Mouth/Throat:     Mouth: Mucous membranes are dry.  Eyes:     Extraocular Movements: Extraocular movements intact.     Conjunctiva/sclera: Conjunctivae normal.     Pupils: Pupils are equal, round, and reactive to light.  Cardiovascular:     Rate and Rhythm: Normal rate and regular rhythm.     Pulses: Normal pulses.  Heart sounds: Normal heart sounds.  Pulmonary:     Effort: Pulmonary effort is normal.     Breath sounds: Normal breath sounds.  Abdominal:     General: Abdomen is flat. Bowel sounds are normal.     Palpations: Abdomen is soft.     Comments: Small amount of blood in umbilicus (per chart, it's a met)  Musculoskeletal:        General: Normal range of motion.     Cervical back: Normal range of motion and neck supple.  Skin:    General: Skin is warm.     Capillary Refill: Capillary refill takes less than 2 seconds.  Neurological:     General: No focal deficit present.     Mental Status: He is alert and oriented to person, place, and time.  Psychiatric:        Mood and Affect: Mood normal.        Behavior: Behavior normal.     ED Results / Procedures / Treatments   Labs (all labs ordered are listed, but  only abnormal results are displayed) Labs Reviewed  CBC WITH DIFFERENTIAL/PLATELET - Abnormal; Notable for the following components:      Result Value   RBC 3.97 (*)    Hemoglobin 11.9 (*)    HCT 38.5 (*)    Lymphs Abs 0.2 (*)    All other components within normal limits  COMPREHENSIVE METABOLIC PANEL - Abnormal; Notable for the following components:   Chloride 94 (*)    CO2 20 (*)    Glucose, Bld 108 (*)    BUN 36 (*)    Creatinine, Ser 7.05 (*)    Total Protein 6.4 (*)    Albumin 3.2 (*)    AST 43 (*)    Total Bilirubin 2.0 (*)    GFR, Estimated 7 (*)    Anion gap 25 (*)    All other components within normal limits  LACTIC ACID, PLASMA - Abnormal; Notable for the following components:   Lactic Acid, Venous 7.5 (*)    All other components within normal limits  CULTURE, BLOOD (ROUTINE X 2)  CULTURE, BLOOD (ROUTINE X 2)  LACTIC ACID, PLASMA  CBG MONITORING, ED  I-STAT CHEM 8, ED    EKG EKG Interpretation Date/Time:  Wednesday October 03 2023 11:54:20 EDT Ventricular Rate:  95 PR Interval:  181 QRS Duration:  124 QT Interval:  361 QTC Calculation: 473 R Axis:   83  Text Interpretation: Sinus tachycardia Nonspecific intraventricular conduction delay No significant change since last tracing Confirmed by Jacalyn Lefevre [phone]) on 10/03/2023 2:43:46 PM  Radiology DG Chest Portable 1 View Result Date: 10/03/2023 CLINICAL DATA:  Altered mental status. EXAM: PORTABLE CHEST 1 VIEW COMPARISON:  09/20/2023. FINDINGS: Bilateral lung fields are clear. Bilateral costophrenic angles are clear. Normal cardio-mediastinal silhouette. No acute osseous abnormalities. The soft tissues are within normal limits. IMPRESSION: No active disease. Electronically Signed   By: Jules Schick M.D.   On: 10/03/2023 14:15    Procedures Procedures    Medications Ordered in ED Medications  sodium chloride 0.9 % bolus 500 mL (500 mLs Intravenous Bolus 10/03/23 1218)  sodium chloride 0.9 % bolus 500  mL (500 mLs Intravenous Bolus 10/03/23 1401)    ED Course/ Medical Decision Making/ A&P                                 Medical Decision Making Amount  and/or Complexity of Data Reviewed Labs: ordered. Radiology: ordered.  Risk Decision regarding hospitalization.   This patient presents to the ED for concern of weakness, this involves an extensive number of treatment options, and is a complaint that carries with it a high risk of complications and morbidity.  The differential diagnosis includes electrolyte abn, dehydration, infection   Co morbidities that complicate the patient evaluation  ESRD on HD (Tu, Th, Sat), metastatic carcinoid (neuroendocrine) tumor, htn, hld, chf   Additional history obtained:  Additional history obtained from epic chart review External records from outside source obtained and reviewed including EMS report   Lab Tests:  I Ordered, and personally interpreted labs.  The pertinent results include:  cbc is nl other than hgb sl low at 11.9 (stable); lactic elevated at 7.5; cmp with bun 36 and cr 7.05 (chronic)   Imaging Studies ordered:  I ordered imaging studies including cxr  I independently visualized and interpreted imaging which showed No active disease.  I agree with the radiologist interpretation   Cardiac Monitoring:  The patient was maintained on a cardiac monitor.  I personally viewed and interpreted the cardiac monitored which showed an underlying rhythm of: nsr   Medicines ordered and prescription drug management:  I ordered medication including ivfs  for sx  Reevaluation of the patient after these medicines showed that the patient improved I have reviewed the patients home medicines and have made adjustments as needed   Test Considered:  ct   Critical Interventions:  ivfs   Consultations Obtained:  I requested consultation with the hospitalists (Dr. Arbutus Leas),  and discussed lab and imaging findings as well as pertinent  plan -he will admit   Problem List / ED Course:  Hypotension and lactic acidosis:  I think this is secondary to his metastatic carcinoid and severe diarrhea.  Pt does meet sepsis criteria with lactic acid, temp and bp, but I think it is all from his diarrhea, so he did not get abx.  I did send blood cultures.  Bp is responding to ivfs.  I am giving gentle IVFs b/c pt is a dialysis patient who has missed dialysis tx.  Pt placed on a bair hugger and temp is improving.  Pt does not urinate, so urine was not sent.  CT scan done recently for the same did not show anything acute. ESRD on HD:  pt's k is normal and no evidence of chf on cxr or sob.   Reevaluation:  After the interventions noted above, I reevaluated the patient and found that they have :improved   Social Determinants of Health:  Lives at home   Dispostion:  After consideration of the diagnostic results and the patients response to treatment, I feel that the patent would benefit from admission.  CRITICAL CARE Performed by: Jacalyn Lefevre   Total critical care time: 30 minutes  Critical care time was exclusive of separately billable procedures and treating other patients.  Critical care was necessary to treat or prevent imminent or life-threatening deterioration.  Critical care was time spent personally by me on the following activities: development of treatment plan with patient and/or surrogate as well as nursing, discussions with consultants, evaluation of patient's response to treatment, examination of patient, obtaining history from patient or surrogate, ordering and performing treatments and interventions, ordering and review of laboratory studies, ordering and review of radiographic studies, pulse oximetry and re-evaluation of patient's condition.           Final Clinical Impression(s) / ED  Diagnoses Final diagnoses:  Metastatic carcinoid tumor (HCC)  Diarrhea, unspecified type  Fall, initial encounter   Orthostatic hypotension  Lactic acidosis  Hypothermia, initial encounter  ESRD on hemodialysis [facility])    Rx / DC Orders ED Discharge Orders     None         Jacalyn Lefevre, MD 10/03/23 1451

## 2023-10-03 NOTE — ED Triage Notes (Signed)
 Pt bib REMS, for a fall. Pt denies hitting head, losing LOC, pt states he tripped of rug. Pt is a dialysis pt (left arm restricted), last treatment was Saturday, pt states he missed yesterday because he was tired, pt states he also had kidney cancer that is other places now. EMS place a 22 G in right hand, and started a NS bolus.

## 2023-10-03 NOTE — ED Notes (Signed)
 Pt and wife states that he no longer makes urine, updated MD, she stated not to do the in and out cath.

## 2023-10-03 NOTE — TOC Initial Note (Signed)
 Transition of Care [facility]) - Initial/Assessment Note    Patient Details  Name: Allen Davenport MRN: [id number] Date of Birth: October 03, 1940  Transition of [facility]) CM/SW Contact:    Barron Alvine, RN Phone Number: 10/03/2023, 7:31 PM  Clinical Narrative:                 Pt c/ESRD, metastatic cancer. From home c/wife. Palliative consulted. TOC to follow.        Patient Goals and CMS Choice            Expected Discharge Plan and Services                                              Prior Living Arrangements/Services                       Activities of Daily Living   ADL Screening (condition at time of admission) Independently performs ADLs?: Yes (appropriate for developmental age) Is the patient deaf or have difficulty hearing?: No Does the patient have difficulty seeing, even when wearing glasses/contacts?: No Does the patient have difficulty concentrating, remembering, or making decisions?: No  Permission Sought/Granted                  Emotional Assessment              Admission diagnosis:  Hypotension [I95.9] Patient Active Problem List   Diagnosis Date Noted   Hypotension 10/03/2023   ESRD (end stage renal disease) (HCC) 10/03/2023   Lactic acidosis 10/03/2023   Hypothermia 10/03/2023   Diarrhea 08/14/2023   Medication monitoring encounter 07/02/2019   Acute anemia 06/06/2019   Latent tuberculosis by blood test 06/04/2019   Malignant carcinoid tumor (HCC) 01/03/2016   Postablative hypothyroidism 06/04/2015   Anemia, chronic renal failure, stage 4 (severe) (HCC) 08/19/2014   Iron deficiency anemia 08/19/2014   Kidney disease with fluid retention 08/19/2014   Malabsorption due to intolerance, not elsewhere classified 08/19/2014   Renal carcinoma, right (HCC) 08/19/2014   Prostate cancer (HCC) 05/13/2014   Anemia in chronic renal disease 01/21/2014   Renal cancer, right (HCC) 01/21/2014   Vitamin B12 deficiency anemia due  to malabsorption with proteinuria 01/21/2014   Vitamin B12 deficiency anemia due to selective vitamin B12 malabsorption with proteinuria 01/21/2014   Carcinoid (except of appendix) 01/21/2014   Head and neck cancer (HCC) 05/28/2013   Heme + stool 05/28/2013   Hiatal hernia 05/28/2013   Hypercholesterolemia 05/28/2013   Hypertension 05/28/2013   Iron (Fe) deficiency anemia 05/28/2013   Kidney carcinoma (HCC) 05/28/2013   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   PCP:  Toma Deiters, MD Pharmacy:   [facility] - [place], [place] - [address] [place] [address] Phone: [phone] Fax: [phone]     Social Drivers of Health (SDOH) Social History: SDOH Screenings   Food Insecurity: No Food Insecurity (10/03/2023)  Housing: Low Risk  (10/03/2023)  Transportation Needs: No Transportation Needs (10/03/2023)  Utilities: Not At Risk (10/03/2023)  Alcohol Screen: Low Risk  (05/24/2020)  Depression (PHQ2-9): Low Risk  (06/04/2019)  Financial Resource Strain: Low Risk  (05/24/2020)  Physical Activity: Insufficiently Active (05/24/2020)  Social Connections: Moderately Integrated (05/24/2020)  Stress: No Stress Concern Present (05/24/2020)  Tobacco Use: Low Risk  (10/03/2023)   SDOH Interventions:  Readmission Risk Interventions    10/03/2023    7:27 PM  Readmission Risk Prevention Plan  Transportation Screening Complete  PCP or Specialist Appt within 5-7 Days Complete  Home Care Screening Complete  Medication Review (RN CM) Complete

## 2023-10-03 NOTE — Progress Notes (Signed)
 Pharmacy Antibiotic Note  Allen Davenport is a 83 y.o. male admitted on 10/03/2023 with sepsis.  Pharmacy has been consulted for vancomycin and cefepime dosing. Patient is ESRD on HM TThSat  Plan: Vancomycin 1250 mg IV x 1 dose Vancomycin 750 mg IV every TThSat w/ HD Cefepime 2000 mg IV x 1 now and then every TThSat w/ HD. Monitor labs, c/s, and vanco levels as indicated.  Height: 6\' 5"  (195.6 cm) Weight: 64.7 kg (142 lb 10.2 oz) IBW/kg (Calculated) : 89.1  Temp (24hrs), Avg:96.5 F (35.8 C), Min:94.7 F (34.8 C), Max:97.8 F (36.6 C)  Recent Labs  Lab 10/03/23 1216 10/03/23 1432  WBC 7.8  --   CREATININE 7.05*  --   LATICACIDVEN 7.5* 5.0*    Estimated Creatinine Clearance: 7.4 mL/min (A) (by C-G formula based on SCr of 7.05 mg/dL (H)).    No Known Allergies  Antimicrobials this admission: Vanco 3/19 >> Cefepime 3/19 >>   Microbiology results: 3/19 BCx: pending Respiratory Panel: pending   Thank you for allowing pharmacy to be a part of this patient's care.  Judeth Cornfield, PharmD Clinical Pharmacist 10/03/2023 4:01 PM

## 2023-10-03 NOTE — H&P (Signed)
 History and Physical    Patient: Allen Davenport NUU:[id number] DOB: 1941-03-18 DOA: 10/03/2023 DOS: the patient was seen and examined on 10/03/2023 PCP: Toma Deiters, MD  Patient coming from: Home  Chief Complaint:  Chief Complaint  Patient presents with   Fall   HPI: Allen Davenport is a 83 year old male with a history of ESRD (TTS), malignant carcinoid tumor with metastasis to the liver and peritoneum presenting with generalized weakness and mechanical fall.  The patient spouse is at the bedside to supplement the history.  She states that the patient has had a gradual functional decline over the past 3 to 4 months.  She states that he has lost 100 pounds in the past 6 months.  The patient frequently misses dialysis because of generalized weakness and just not wanting to go.  His last dialysis session was on 09/29/2023.  She states that the patient only had 1 dialysis session all last week prior to this admission.  She states that his dialysis sessions are sometimes cut off short because of his low blood pressure. Apparently the patient lost his balance and fell onto the floor on the morning of 10/03/2023.  He had difficulty getting up.  As result, the patient was brought to emergency department for further evaluation.  The patient himself denies any chest pain, shortness breath, abdominal pain, headache, neck pain.  He denies any fevers or chills.  He states that he has chronic loose stools.  On the average, the patient has 2-3 loose bowel movements on a daily basis.  He states that he has been having dry heaves on a daily basis at least for the past 2 to 3 months.  His appetite has been poor.  He denies any new medications.  He is having difficulty keeping his medications down at home. The patient states that he occasionally has some hematochezia with his bowel movements.  He denies any melena or hematemesis. In the ED, the patient was hypothermic initially with a temperature of 94.7 F.  He was  hypotensive with SBP in the 70s initially.  The patient was given 1 L of fluids with some improvement.  WBC 7.8, hemoglobin 9.9, platelets 220.  Sodium 139, potassium 3.9, bicarbonate 20, serum creatinine 7.05.  AST 43, ALT 12, alk phosphatase 26, total bilirubin 2.0. Chest x-ray was negative for any infiltrates.  Lactic acid 7.5.  EKG showed sinus tachycardia.  Review of Systems: As mentioned in the history of present illness. All other systems reviewed and are negative. Past Medical History:  Diagnosis Date   Anemia    Cancer (HCC)    right renal . Prostate- Radiation treatment   CHF (congestive heart failure) (HCC)    Chronic kidney disease    Dialysis T/TH/Sa   Edema    Heart murmur    "nothing to worry about"   Hyperlipidemia    Hypertension    Malignant carcinoid tumor (HCC) 01/03/2016   Pneumonia    as a child   Past Surgical History:  Procedure Laterality Date   AV FISTULA PLACEMENT Left 10/20/2014   Procedure: Left Arm ARTERIOVENOUS (AV) FISTULA CREATION;  Surgeon: Sherren Kerns, MD;  Location: [facility] OR;  Service: Vascular;  Laterality: Left;   NEPHRECTOMY Right 2010   PERIPHERAL VASCULAR BALLOON ANGIOPLASTY  06/20/2019   Procedure: PERIPHERAL VASCULAR BALLOON ANGIOPLASTY;  Surgeon: Sherren Kerns, MD;  Location: [facility] INVASIVE CV LAB;  Service: Cardiovascular;;   REVISON OF ARTERIOVENOUS FISTULA Left 08/04/2019   Procedure: REVISON  OF ARTERIOVENOUS FISTULA WITH SIDE BRANCH LIGATION;  Surgeon: Sherren Kerns, MD;  Location: [facility] OR;  Service: Vascular;  Laterality: Left;   Social History:  reports that he has never smoked. He has never used smokeless tobacco. He reports that he does not drink alcohol and does not use drugs.  No Known Allergies  Family History  Problem Relation Age of Onset   Cancer Mother    Hypertension Father    Cancer Sister     Prior to Admission medications   Medication Sig Start Date End Date Taking? Authorizing Provider  amLODipine (NORVASC)  2.5 MG tablet Take 2.5 mg by mouth daily. 09/03/23  Yes [provider]  Calcium Carb-Cholecalciferol (CALCIUM 600 + D PO) Take 1 tablet by mouth daily.   Yes [provider]  diphenoxylate-atropine (LOMOTIL) 2.5-0.025 MG tablet Take 2 tablets by mouth 4 (four) times daily as needed for diarrhea or loose stools. 08/14/23  Yes Doreatha Massed, MD  fexofenadine (ALLEGRA) 180 MG tablet Take 180 mg by mouth daily. 03/02/22  Yes [provider]  furosemide (LASIX) 40 MG tablet Take 40 mg by mouth every morning. 06/25/19  Yes [provider]  mirtazapine (REMERON) 7.5 MG tablet Take 7.5 mg by mouth at bedtime. 03/07/22  Yes [provider]  ondansetron (ZOFRAN) 4 MG tablet Take 4 mg by mouth every 8 (eight) hours as needed. 09/19/23  Yes [provider]  Riboflavin (B2) 100 MG TABS Take 100 mg by mouth daily.   Yes [provider]  sodium bicarbonate 650 MG tablet Take 1 tablet by mouth 3 (three) times daily.   Yes [provider]  TUBERCULIN SYR 1CC/27GX1/2" (B-D TB SYRINGE 1CC/27GX1/2") 27G X 1/2" 1 ML MISC Use to administer pegasys every 14 days 06/22/22   Doreatha Massed, MD    Physical Exam: Vitals:   10/03/23 1415 10/03/23 1424 10/03/23 1425 10/03/23 1500  BP: 99/69 91/66  (!) 104/59  Pulse: 91 97  98  Resp: 17 (!) 27  13  Temp:   (!) 96.9 F (36.1 C)   TempSrc:   Rectal   SpO2: 100% 91%  100%  Weight:      Height:       GENERAL:  A&O x 3, NAD, well developed, cooperative, follows commands HEENT: [place]/AT, No thrush, No icterus, No oral ulcers Neck:  No neck mass, No meningismus, soft, supple CV: RRR, no S3, no S4, no rub, no JVD Lungs: Diminished breath sounds at the bases.  No wheezing.  Good air movement. Abd: soft/NT +BS, nondistended Ext: 1 + LE edema, no lymphangitis, no cyanosis, no rashes Neuro:  CN II-XII intact, strength 4/5 in RUE, RLE, strength 4/5 LUE, LLE; sensation intact bilateral; no dysmetria;  babinski equivocal  Data Reviewed: Data reviewed above in the history  Assessment and Plan: Generalized weakness -Multifactorial including volume depletion, progression of his malignancy, and possible infectious process -Patient is anuric so urine sample unable to be collected -Start empiric vancomycin and cefepime -B12 -TSH -Folic acid  Hypotension/Hypothermia -Likely related to volume depletion although concerned about underlying infectious process -Started empiric Antibiotics -Check PCT -Continue judicious IV fluids -am cortisol -CT AP  Diarrhea -Related to the patient's carcinoid tumor -Check C. Difficile -Stool pathogen panel  Intractable vomiting -Start around-the-clock Compazine -Start pantoprazole  Lactic acidosis -Suspect decreased clearance in the setting of ESRD -Empiric antibiotics as discussed above -Judicious IV fluids -Repeat lactic acid -Check VBG  Metastatic carcinoid tumor to the peritoneum and liver -  No longer a candidate for further therapy  ESRD -Nephrology consult for maintenance dialysis -Normally dialyzes TTS in [place] -Last dialysis 09/29/2023--patient dialyzed 2 hours   Advance Care Planning:   Code Status: Do not attempt resuscitation (DNR) PRE-ARREST INTERVENTIONS DESIRED   Consults: palliative  Family Communication: spouse updated 3/19  Severity of Illness: The appropriate patient status for this patient is INPATIENT. Inpatient status is judged to be reasonable and necessary in order to provide the required intensity of service to ensure the patient's safety. The patient's presenting symptoms, physical exam findings, and initial radiographic and laboratory data in the context of their chronic comorbidities is felt to place them at high risk for further clinical deterioration. Furthermore, it is not anticipated that the patient will be medically stable for discharge from the hospital within 2 midnights of admission.   * I certify that at  the point of admission it is my clinical judgment that the patient will require inpatient hospital care spanning beyond 2 midnights from the point of admission due to high intensity of service, high risk for further deterioration and high frequency of surveillance required.*  Author: Catarina Hartshorn, MD 10/03/2023 3:27 PM  For on call review [email or web address].

## 2023-10-03 NOTE — Consult Note (Signed)
 Consultation Note Date: 10/03/2023   Patient Name: Allen Davenport  DOB: 25-Jan-1941  MRN: [id number]  Age / Sex: 83 y.o., male  PCP: Toma Deiters, MD Referring Physician: Catarina Hartshorn, MD  Reason for Consultation: Establishing goals of care  HPI/Patient Profile: 83 y.o. male  with past medical history of ESRD on HD, malignant carcinoid tumor with metastatic burden to liver and peritoneum, 100 pound weight loss over the last 6 months.  Functional decline over the past 3 to 4 months, history of right renal and prostate cancer with radiation treatment, CHF, HTN/HLD admitted on 10/03/2023 with generalized weakness.   Clinical Assessment and Goals of Care: I have reviewed medical records including EPIC notes, labs and imaging, received report from RN, assessed the patient.  Mr. Baley is lying quietly on the stretcher in the ED.  He appears acutely/chronically ill and very frail.  He greets me, making but not keeping eye contact.  He is alert and oriented x 3, able to make his needs known.  His wife of 36 years, Corrie Dandy, is present at bedside.  We meet at the bedside to discuss diagnosis prognosis, GOC, EOL wishes, disposition and options.  I introduced Palliative Medicine as specialized medical care for people living with serious illness. It focuses on providing relief from the symptoms and stress of a serious illness. The goal is to improve quality of life for both the patient and the family.  We discussed a brief life review of the patient.  Mr. Mrs. Nichelson have been married for 36 years.  They have no children.   and then focused on their current illness.  We talk about Mr. Gervin to remain chronic health concerns in detail, hemodialysis and metastatic cancer.  I ask what Mr. Berrett understands about his cancer, what his doctors have told him.  He seems to understand that his cancer has progressed and there is no further  treatment option.  Mrs. Bauer certainly endorses this.  We talked about hemodialysis, how Mr. Timmons has missed several sessions recently.  I share that his body may not be able to tolerate dialysis for much longer.  I ask if this would surprise him and he shares that it would.  Mrs. Dupee states that this would not surprise her.  The natural disease trajectory and expectations at EOL were discussed.  Advanced directives, concepts specific to code status, artifical feeding and hydration, and rehospitalization were considered and discussed.  DNR.  Mr. Gayler states that his wife would make healthcare choices if he were unable.  He gives her the right to choose DNR for him.  Hospice and Palliative Care services outpatient were explained and offered.  We talked about the benefits of palliative and hospice care.  Mrs. Zirkle states that palliative services called today.  Mr. Lurz states that he is agreeable to outpatient palliative services.  He tells me that he is not agreeable to hospice care because he does not want to stop dialysis.  I asked what would happen if he stopped  dialysis, he states he would die.  Discussed the importance of continued conversation with family and the medical providers regarding overall plan of care and treatment options, ensuring decisions are within the context of the patient's values and GOCs.  Questions and concerns were addressed.  The family was encouraged to call with questions or concerns.  PMT will continue to support holistically.  Conference with attending, bedside nursing staff, transition of care team related to patient condition, needs, goals of care, disposition.   HCPOA  NEXT OF KIN -wife of 36 years, Mary Lewin.    SUMMARY OF RECOMMENDATIONS   Treat the treatable but no CPR or intubation Time for outcomes Would like to continue hemodialysis as long as offered. Understands that no further cancer treatment is offered Agreeable to outpatient palliative  services, not hospice   Code Status/Advance Care Planning: DNR  Symptom Management:  Per hospitalist, no additional needs at this time. No morphine for ESRD.  Palliative Prophylaxis:  Frequent Pain Assessment and Oral Care  Additional Recommendations (Limitations, Scope, Preferences): Continue to treat but no CPR or intubation.  Psycho-social/Spiritual:  Desire for further Chaplaincy support:no Additional Recommendations: Caregiving  Support/Resources  Prognosis:  Unable to determine, based on outcomes.  Less than 2 weeks anticipated if patient is unable to tolerate hemodialysis.  Best case scenario 3 months or less.  Discharge Planning: To Be Determined      Primary Diagnoses: Present on Admission:  Hypotension   I have reviewed the medical record, interviewed the patient and family, and examined the patient. The following aspects are pertinent.  Past Medical History:  Diagnosis Date   Anemia    Cancer (HCC)    right renal . Prostate- Radiation treatment   CHF (congestive heart failure) (HCC)    Chronic kidney disease    Dialysis T/TH/Sa   Edema    Heart murmur    "nothing to worry about"   Hyperlipidemia    Hypertension    Malignant carcinoid tumor (HCC) 01/03/2016   Pneumonia    as a child   Social History   Socioeconomic History   Marital status: Married    Spouse name: Not on file   Number of children: Not on file   Years of education: Not on file   Highest education level: Not on file  Occupational History   Not on file  Tobacco Use   Smoking status: Never   Smokeless tobacco: Never  Vaping Use   Vaping status: Never Used  Substance and Sexual Activity   Alcohol use: No    Alcohol/week: 0.0 standard drinks of alcohol   Drug use: No   Sexual activity: Not on file  Other Topics Concern   Not on file  Social History Narrative   Not on file   Social Drivers of Health   Financial Resource Strain: Low Risk  (05/24/2020)   Overall Financial  Resource Strain (CARDIA)    Difficulty of Paying Living Expenses: Not hard at all  Food Insecurity: No Food Insecurity (05/24/2020)   Hunger Vital Sign    Worried About Running Out of Food in the Last Year: Never true    Ran Out of Food in the Last Year: Never true  Transportation Needs: No Transportation Needs (05/24/2020)   PRAPARE - Administrator, Civil Service (Medical): No    Lack of Transportation (Non-Medical): No  Physical Activity: Insufficiently Active (05/24/2020)   Exercise Vital Sign    Days of Exercise per Week: 7 days  Minutes of Exercise per Session: 10 min  Stress: No Stress Concern Present (05/24/2020)   [employer] of Occupational Health - Occupational Stress Questionnaire    Feeling of Stress : Not at all  Social Connections: Moderately Integrated (05/24/2020)   Social Connection and Isolation Panel [NHANES]    Frequency of Communication with Friends and Family: More than three times a week    Frequency of Social Gatherings with Friends and Family: Once a week    Attends Religious Services: More than 4 times per year    Active Member of [employer] or Organizations: No    Attends Banker Meetings: Never    Marital Status: Married   Family History  Problem Relation Age of Onset   Cancer Mother    Hypertension Father    Cancer Sister    Scheduled Meds: Continuous Infusions: PRN Meds:. Medications Prior to Admission:  Prior to Admission medications   Medication Sig Start Date End Date Taking? Authorizing Provider  amLODipine (NORVASC) 2.5 MG tablet Take 2.5 mg by mouth daily. 09/03/23  Yes [provider]  Calcium Carb-Cholecalciferol (CALCIUM 600 + D PO) Take 1 tablet by mouth daily.   Yes [provider]  diphenoxylate-atropine (LOMOTIL) 2.5-0.025 MG tablet Take 2 tablets by mouth 4 (four) times daily as needed for diarrhea or loose stools. 08/14/23  Yes Doreatha Massed, MD  fexofenadine (ALLEGRA) 180 MG tablet  Take 180 mg by mouth daily. 03/02/22  Yes [provider]  furosemide (LASIX) 40 MG tablet Take 40 mg by mouth every morning. 06/25/19  Yes [provider]  mirtazapine (REMERON) 7.5 MG tablet Take 7.5 mg by mouth at bedtime. 03/07/22  Yes [provider]  ondansetron (ZOFRAN) 4 MG tablet Take 4 mg by mouth every 8 (eight) hours as needed. 09/19/23  Yes [provider]  Riboflavin (B2) 100 MG TABS Take 100 mg by mouth daily.   Yes [provider]  sodium bicarbonate 650 MG tablet Take 1 tablet by mouth 3 (three) times daily.   Yes [provider]  TUBERCULIN SYR 1CC/27GX1/2" (B-D TB SYRINGE 1CC/27GX1/2") 27G X 1/2" 1 ML MISC Use to administer pegasys every 14 days 06/22/22   Doreatha Massed, MD   No Known Allergies Review of Systems  Unable to perform ROS: Acuity of condition    Physical Exam Vitals and nursing note reviewed.  Constitutional:      General: He is not in acute distress.    Appearance: He is ill-appearing.     Comments: Frail and thin  Cardiovascular:     Rate and Rhythm: Tachycardia present.  Pulmonary:     Effort: Pulmonary effort is normal. No respiratory distress.  Skin:    General: Skin is warm and dry.  Neurological:     Mental Status: He is alert and oriented to person, place, and time.  Psychiatric:        Mood and Affect: Mood normal.        Behavior: Behavior normal.     Vital Signs: BP 91/66   Pulse 97   Temp (!) 96.9 F (36.1 C) (Rectal)   Resp (!) 27   Ht 6\' 5"  (1.956 m)   Wt 64.7 kg   SpO2 91%   BMI 16.91 kg/m  Pain Scale: 0-10   Pain Score: 0-No pain   SpO2: SpO2: 91 % O2 Device:SpO2: 91 % O2 Flow Rate: .   IO: Intake/output summary: No intake or output data in the 24 hours ending  10/03/23 1458  LBM:   Baseline Weight: Weight: 64.7 kg Most recent weight: Weight: 64.7 kg     Palliative Assessment/Data:     Time In: 1450 Time Out: 1545 Time Total: 55 minutes Greater than  50%  of this time was spent counseling and coordinating care related to the above assessment and plan.  Signed by: Katheran Awe, NP   Please contact Palliative Medicine Team phone at [phone] for questions and concerns.  For individual provider: See Loretha Stapler

## 2023-10-03 NOTE — Hospital Course (Addendum)
 83 year old male with a history of ESRD (TTS), malignant carcinoid tumor with metastasis to the liver and peritoneum presenting with generalized weakness and mechanical fall.  The patient spouse is at the bedside to supplement the history.  She states that the patient has had a gradual functional decline over the past 3 to 4 months.  She states that he has lost 100 pounds in the past 6 months.  The patient frequently misses dialysis because of generalized weakness and just not wanting to go.  His last dialysis session was on 09/29/2023.  She states that the patient only had 1 dialysis session all last week prior to this admission.  She states that his dialysis sessions are sometimes cut off short because of his low blood pressure. Apparently the patient lost his balance and fell onto the floor on the morning of 10/03/2023.  He had difficulty getting up.  As result, the patient was brought to emergency department for further evaluation.  The patient himself denies any chest pain, shortness breath, abdominal pain, headache, neck pain.  He denies any fevers or chills.  He states that he has chronic loose stools.  On the average, the patient has 2-3 loose bowel movements on a daily basis.  He states that he has been having dry heaves on a daily basis at least for the past 2 to 3 months.  His appetite has been poor.  He denies any new medications.  He is having difficulty keeping his medications down at home. The patient states that he occasionally has some hematochezia with his bowel movements.  He denies any melena or hematemesis. In the ED, the patient was hypothermic initially with a temperature of 94.7 F.  He was hypotensive with SBP in the 70s initially.  The patient was given 1 L of fluids with some improvement.  WBC 7.8, hemoglobin 9.9, platelets 220.  Sodium 139, potassium 3.9, bicarbonate 20, serum creatinine 7.05.  AST 43, ALT 12, alk phosphatase 26, total bilirubin 2.0. Chest x-ray was negative for any  infiltrates.  Lactic acid 7.5.  EKG showed sinus tachycardia. The patient was fluid resuscitated and started on midodrine with some improvement in his blood pressure.  However, the patient still had difficulty tolerating dialysis secondary to hypotension. The case was discussed with nephrology who felt that the patient is a poor candidate for further dialysis in the setting of his metastatic cancer and poor tolerance of dialysis at this point. During the hospitalization, CT of the abdomen and pelvis continue to showed peritoneal carcinomatosis and it also showed choledocholithiasis.  MRCP was obtained and also suggested choledocholithiasis. The patient developed rectal bleeding during hospitalization.  GI was consulted.  Given the patient's poor baseline functioning, frailty, metastatic cancer, and seriousness of the acute medical conditions goals of care discussions were held with the patient's spouse and patient.  Ultimately, it was determined in the patient's best interest to change his focus of care to focus on comfort measures. After further discussion, spouse wanted to take patient home with hospcie care.  TOC helped with transition home.

## 2023-10-04 ENCOUNTER — Inpatient Hospital Stay (HOSPITAL_COMMUNITY)

## 2023-10-04 DIAGNOSIS — I951 Orthostatic hypotension: Secondary | ICD-10-CM | POA: Diagnosis not present

## 2023-10-04 DIAGNOSIS — R197 Diarrhea, unspecified: Secondary | ICD-10-CM | POA: Diagnosis not present

## 2023-10-04 DIAGNOSIS — N186 End stage renal disease: Secondary | ICD-10-CM | POA: Diagnosis not present

## 2023-10-04 DIAGNOSIS — T68XXXA Hypothermia, initial encounter: Secondary | ICD-10-CM | POA: Diagnosis not present

## 2023-10-04 DIAGNOSIS — I959 Hypotension, unspecified: Secondary | ICD-10-CM | POA: Diagnosis not present

## 2023-10-04 DIAGNOSIS — Z992 Dependence on renal dialysis: Secondary | ICD-10-CM | POA: Diagnosis not present

## 2023-10-04 DIAGNOSIS — E872 Acidosis, unspecified: Secondary | ICD-10-CM | POA: Diagnosis not present

## 2023-10-04 LAB — HEPATIC FUNCTION PANEL
ALT: 12 U/L (ref 0–44)
AST: 59 U/L — ABNORMAL HIGH (ref 15–41)
Albumin: 2.6 g/dL — ABNORMAL LOW (ref 3.5–5.0)
Alkaline Phosphatase: 58 U/L (ref 38–126)
Bilirubin, Direct: 0.2 mg/dL (ref 0.0–0.2)
Indirect Bilirubin: 1.3 mg/dL — ABNORMAL HIGH (ref 0.3–0.9)
Total Bilirubin: 1.5 mg/dL — ABNORMAL HIGH (ref 0.0–1.2)
Total Protein: 5.1 g/dL — ABNORMAL LOW (ref 6.5–8.1)

## 2023-10-04 LAB — CBC
HCT: 31.2 % — ABNORMAL LOW (ref 39.0–52.0)
Hemoglobin: 9.7 g/dL — ABNORMAL LOW (ref 13.0–17.0)
MCH: 29.6 pg (ref 26.0–34.0)
MCHC: 31.1 g/dL (ref 30.0–36.0)
MCV: 95.1 fL (ref 80.0–100.0)
Platelets: 140 10*3/uL — ABNORMAL LOW (ref 150–400)
RBC: 3.28 MIL/uL — ABNORMAL LOW (ref 4.22–5.81)
RDW: 15.2 % (ref 11.5–15.5)
WBC: 8.5 10*3/uL (ref 4.0–10.5)
nRBC: 0 % (ref 0.0–0.2)

## 2023-10-04 LAB — HEPATITIS B SURFACE ANTIGEN: Hepatitis B Surface Ag: NONREACTIVE

## 2023-10-04 LAB — MRSA NEXT GEN BY PCR, NASAL: MRSA by PCR Next Gen: NOT DETECTED

## 2023-10-04 LAB — RENAL FUNCTION PANEL
Albumin: 2.6 g/dL — ABNORMAL LOW (ref 3.5–5.0)
Anion gap: 16 — ABNORMAL HIGH (ref 5–15)
BUN: 43 mg/dL — ABNORMAL HIGH (ref 8–23)
CO2: 24 mmol/L (ref 22–32)
Calcium: 8.2 mg/dL — ABNORMAL LOW (ref 8.9–10.3)
Chloride: 99 mmol/L (ref 98–111)
Creatinine, Ser: 7.43 mg/dL — ABNORMAL HIGH (ref 0.61–1.24)
GFR, Estimated: 7 mL/min — ABNORMAL LOW (ref >60)
Glucose, Bld: 79 mg/dL (ref 70–99)
Phosphorus: 4 mg/dL (ref 2.5–4.6)
Potassium: 3.8 mmol/L (ref 3.5–5.1)
Sodium: 139 mmol/L (ref 135–145)

## 2023-10-04 LAB — CORTISOL-AM, BLOOD: Cortisol - AM: 20.9 ug/dL (ref 6.7–22.6)

## 2023-10-04 LAB — LACTIC ACID, PLASMA: Lactic Acid, Venous: 1.4 mmol/L (ref 0.5–1.9)

## 2023-10-04 MED ORDER — LIDOCAINE HCL (PF) 1 % IJ SOLN: 5.0000 mL | INTRAMUSCULAR | Status: DC | PRN

## 2023-10-04 MED ORDER — CHLORHEXIDINE GLUCONATE CLOTH 2 % EX PADS
6.0000 | MEDICATED_PAD | Freq: Every day | CUTANEOUS | Status: DC
  Administered 2023-10-04 – 2023-10-08 (×4): 6 via TOPICAL

## 2023-10-04 MED ORDER — ALBUMIN HUMAN 25 % IV SOLN
25.0000 g | Freq: Once | INTRAVENOUS | Status: AC
  Administered 2023-10-04: 25 g via INTRAVENOUS
  Filled 2023-10-04: qty 100

## 2023-10-04 MED ORDER — PENTAFLUOROPROP-TETRAFLUOROETH EX AERO: 1.0000 | INHALATION_SPRAY | CUTANEOUS | Status: DC | PRN

## 2023-10-04 MED ORDER — MIDODRINE HCL 5 MG PO TABS
5.0000 mg | ORAL_TABLET | Freq: Three times a day (TID) | ORAL | Status: DC
  Administered 2023-10-04 – 2023-10-05 (×4): 5 mg via ORAL
  Filled 2023-10-04 (×4): qty 1

## 2023-10-04 MED ORDER — LIDOCAINE-PRILOCAINE 2.5-2.5 % EX CREA: 1.0000 | TOPICAL_CREAM | CUTANEOUS | Status: DC | PRN

## 2023-10-04 NOTE — TOC Initial Note (Signed)
 Transition of Care [facility]) - Initial/Assessment Note    Patient Details  Name: Allen Davenport MRN: [id number] Date of Birth: February 28, 1941  Transition of Care [facility] CM/SW Contact:    Elliot Gault, LCSW Phone Number: 10/04/2023, 12:46 PM  Clinical Narrative:                  Pt admitted from home. Pt plans to return home at dc. Pt has been able to get to appointments and obtain medications as needed. Pt receives outpatient HD at baseline.  Palliative APNP following. DC timeframe not yet known.  TOC will follow and assist as needed.   Expected Discharge Plan: Home/Self Care Barriers to Discharge: Continued Medical Work up   Patient Goals and CMS Choice Patient states their goals for this hospitalization and ongoing recovery are:: return home          Expected Discharge Plan and Services In-house Referral: Clinical Social Work     Living arrangements for the past 2 months: Single Family Home                                      Prior Living Arrangements/Services Living arrangements for the past 2 months: Single Family Home Lives with:: Spouse Patient language and need for interpreter reviewed:: Yes        Need for Family Participation in Patient Care: Yes (Comment) Care giver support system in place?: Yes (comment)   Criminal Activity/Legal Involvement Pertinent to Current Situation/Hospitalization: No - Comment as needed  Activities of Daily Living   ADL Screening (condition at time of admission) Independently performs ADLs?: Yes (appropriate for developmental age) Is the patient deaf or have difficulty hearing?: No Does the patient have difficulty seeing, even when wearing glasses/contacts?: No Does the patient have difficulty concentrating, remembering, or making decisions?: No  Permission Sought/Granted                  Emotional Assessment       Orientation: : Oriented to Self, Oriented to Place, Oriented to  Time, Oriented to  Situation Alcohol / Substance Use: Not Applicable Psych Involvement: No (comment)  Admission diagnosis:  Orthostatic hypotension [I95.1] Lactic acidosis [E87.20] ESRD on hemodialysis (HCC) [N18.6, Z99.2] Hypotension [I95.9] Hypothermia, initial encounter [T68.XXXA] Fall, initial encounter [id number]] Metastatic carcinoid tumor (HCC) [C7B.00] Diarrhea, unspecified type [R19.7] Patient Active Problem List   Diagnosis Date Noted   Hypotension 10/03/2023   ESRD (end stage renal disease) (HCC) 10/03/2023   Lactic acidosis 10/03/2023   Hypothermia 10/03/2023   Diarrhea 08/14/2023   Medication monitoring encounter 07/02/2019   Acute anemia 06/06/2019   Latent tuberculosis by blood test 06/04/2019   Malignant carcinoid tumor (HCC) 01/03/2016   Postablative hypothyroidism 06/04/2015   Anemia, chronic renal failure, stage 4 (severe) (HCC) 08/19/2014   Iron deficiency anemia 08/19/2014   Kidney disease with fluid retention 08/19/2014   Malabsorption due to intolerance, not elsewhere classified 08/19/2014   Renal carcinoma, right (HCC) 08/19/2014   Prostate cancer (HCC) 05/13/2014   Anemia in chronic renal disease 01/21/2014   Renal cancer, right (HCC) 01/21/2014   Vitamin B12 deficiency anemia due to malabsorption with proteinuria 01/21/2014   Vitamin B12 deficiency anemia due to selective vitamin B12 malabsorption with proteinuria 01/21/2014   Carcinoid (except of appendix) 01/21/2014   Head and neck cancer (HCC) 05/28/2013   Heme + stool 05/28/2013   Hiatal hernia 05/28/2013  Hypercholesterolemia 05/28/2013   Hypertension 05/28/2013   Iron (Fe) deficiency anemia 05/28/2013   Kidney carcinoma (HCC) 05/28/2013   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   PCP:  Toma Deiters, MD Pharmacy:   [facility] - [place], [place] - [address] [place] [address] Phone: [phone] Fax: [phone]     Social Drivers of Health (SDOH) Social History: SDOH  Screenings   Food Insecurity: No Food Insecurity (10/03/2023)  Housing: Low Risk  (10/03/2023)  Transportation Needs: No Transportation Needs (10/03/2023)  Utilities: Not At Risk (10/03/2023)  Alcohol Screen: Low Risk  (05/24/2020)  Depression (PHQ2-9): Low Risk  (06/04/2019)  Financial Resource Strain: Low Risk  (05/24/2020)  Physical Activity: Insufficiently Active (05/24/2020)  Social Connections: Unknown (10/03/2023)  Stress: No Stress Concern Present (05/24/2020)  Tobacco Use: Low Risk  (10/03/2023)   SDOH Interventions:     Readmission Risk Interventions    10/04/2023   12:43 PM 10/03/2023    7:27 PM  Readmission Risk Prevention Plan  Transportation Screening  Complete  PCP or Specialist Appt within 5-7 Days  Complete  Home Care Screening  Complete  Medication Review (RN CM)  Complete  HRI or Home Care Consult Complete   Social Work Consult for Recovery Care Planning/Counseling Complete   Palliative Care Screening Complete   Medication Review Oceanographer) Complete

## 2023-10-04 NOTE — Progress Notes (Signed)
   HEMODIALYSIS TREATMENT NOTE:  UF was limited by hypotension despite Midodrine, Albumin, and cooling of dialysate.  Net UF 300 ml.   Post-HD:  10/04/23 2215  Vitals  Temp 97.6 F (36.4 C)  Temp Source Axillary  BP 112/60  MAP (mmHg) 75  BP Location Right Arm  BP Method Automatic  Patient Position (if appropriate) Lying  Pulse Rate 92  Pulse Rate Source Monitor  ECG Heart Rate 90  Resp 14  Oxygen Therapy  SpO2 93 %  O2 Device Room Air  Post Treatment  Dialyzer Clearance Lightly streaked  Hemodialysis Intake (mL) 100 mL  Liters Processed 50.4  Fluid Removed (mL) 300 mL  Tolerated HD Treatment No (Comment)  Post-Hemodialysis Comments UF was limited by hypotension  AVG/AVF Arterial Site Held (minutes) 5 minutes  AVG/AVF Venous Site Held (minutes) 5 minutes  Fistula / Graft Left Upper arm Arteriovenous fistula  No Placement Date or Time found.   Placed prior to admission: Yes  Orientation: (c) Left  Access Location: Upper arm  Access Type: Arteriovenous fistula  Fistula / Graft Assessment Thrill;Bruit  Status Patent    Arman Filter, RN AP ICU1

## 2023-10-04 NOTE — Plan of Care (Signed)

## 2023-10-04 NOTE — Plan of Care (Signed)
  Problem: Education: Goal: Knowledge of General Education information will improve Description: Including pain rating scale, medication(s)/side effects and non-pharmacologic comfort measures Outcome: Progressing   Problem: Activity: Goal: Risk for activity intolerance will decrease Outcome: Progressing   Problem: Coping: Goal: Level of anxiety will decrease Outcome: Progressing   Problem: Pain Managment: Goal: General experience of comfort will improve and/or be controlled Outcome: Progressing   Problem: Skin Integrity: Goal: Risk for impaired skin integrity will decrease Outcome: Progressing   Problem: Nutrition: Goal: Adequate nutrition will be maintained Outcome: Not Progressing

## 2023-10-04 NOTE — Consult Note (Signed)
 ESRD Consult Note  Requesting provider: Dr. Arbutus Leas  Reason for consult: ESRD, provision of dialysis  Assessment/Recommendations:  ESRD  -outpatient HD orders: [facility], TTS.  3 hours 45 minutes.  EDW 63 kg.  aVF, 15-gauge.  Flow rates: 400/500.  36 degrees.  3K/2.5 calcium.  Heparin: 1000 units loading, 500 units/h.  Meds: Mircera 30 mcg every 4 weeks (due this week), Venofer 50 mg q. weekly. -has been missing HD frequently and UF has been difficult due to hypotension -HD on TTS schedule, today. No heparin -Overall, not tolerating dialysis it seems especially from an outpatient perspective.  Recommend continuing with palliative discussions in regards to GOC.  Generalized weakness -per primary  Metastatic tumor to peritoneum and liver -not a candidate for further therapy apparently. Recommend continued discussion with palliative. Appreciate assistance. I believe that HD will just further adversely affect quality of life especially given the fact that he has not been tolerating HD as an outpatient  Intractable vomiting -per primary  Hypothermia -on empiric abx per primary  Volume/ hypotension H/o HTN -starting midodrine, avoid anti-HTNs -UF as tolerated  Anemia of Chronic Kidney Disease -possible GIB? Avoid ESA given malignancy with radiographic progression -checking Fe panel -Transfuse PRN for Hgb <7  Secondary Hyperparathyroidism/Hyperphosphatemia - resume home binders,  chk phos  Lactic acidosis -not secondary to ESRD, w/u per primary service. Possibly related to tumor burden/liver disease? -Bicarb support with HD   # Additional recommendations: - Dose all meds for creatinine clearance < 10 ml/min  - Unless absolutely necessary, no MRIs with gadolinium.  - Implement save arm precautions.  Prefer needle sticks in the dorsum of the hands or wrists.  No blood pressure measurements in arm. - If blood transfusion is requested during hemodialysis sessions, please alert [place]  prior to the session.  - If a hemodialysis catheter line culture is requested, please alert [place] as only hemodialysis nurses are able to collect those specimens.   Recommendations were discussed with the primary team. DNR.  Anthony Sar, MD [facility]  History of Present Illness: Allen Davenport is a/an 83 y.o. male with a past medical history of ESRD, malignant carcinoid tumor with metastasis to the liver/peritoneum, history of hypertension, chronic anemia, CHF who presents with generalized weakness.  Has had a gradual functional decline over the past 3 to 4 months with excessive weight loss.  I have discussed with his outpatient dialysis unit and he has been missing treatments frequently and they have been unable to pull fluid due to his low blood pressures. Has chronic loose stools with occasional hematochezia.  Associated with poor appetite. Was found to be hypotensive and hypothermic therefore started on antibiotics empirically. CT abdomen pelvis with contrast on 3/19 revealed increased central necrosis and hypodensity associated with scattered metastatic lesions throughout the liver, biliary dilatation, similar in appearance of a omental and mesenteric nodularity, mildly distended rectum, bladder wall thickening, subcutaneous/mesenteric edema. Patient seen and examined bedside. He reports feeling uncomfortable. Feels weak. He does confirm that dialysis has been going poorly as an outpatient. He reports that he still makes urine, feels like it is a normal amount. No other complaints currently.   Medications:  Current Facility-Administered Medications  Medication Dose Route Frequency Provider Last Rate Last Admin   0.9 %  sodium chloride infusion   Intravenous Continuous Tat, David, MD 50 mL/hr at 10/03/23 2035 Restarted at 10/03/23 2035   acetaminophen (TYLENOL) tablet 650 mg  650 mg Oral Q6H PRN Catarina Hartshorn, MD  Or   acetaminophen (TYLENOL) suppository 650 mg  650 mg  Rectal Q6H PRN Tat, Onalee Hua, MD       ceFEPIme (MAXIPIME) 2 g in sodium chloride 0.9 % 100 mL IVPB  2 g Intravenous Q T,Th,Sa-HD Tat, Onalee Hua, MD       Chlorhexidine Gluconate Cloth 2 % PADS 6 each  6 each Topical Q0600 Tat, David, MD   6 each at 10/03/23 2038   heparin injection 5,000 Units  5,000 Units Subcutaneous Andres Labrum, MD   5,000 Units at 10/04/23 0530   mirtazapine (REMERON) tablet 7.5 mg  7.5 mg Oral Maura Crandall, MD   7.5 mg at 10/03/23 2107   pantoprazole (PROTONIX) injection 40 mg  40 mg Intravenous Allen Ledger, MD   40 mg at 10/03/23 2106   prochlorperazine (COMPAZINE) injection 5 mg  5 mg Intravenous Q6H Tat, Onalee Hua, MD   5 mg at 10/04/23 0530   sodium bicarbonate tablet 650 mg  650 mg Oral TID Catarina Hartshorn, MD   650 mg at 10/03/23 2106   vancomycin (VANCOREADY) IVPB 750 mg/150 mL  750 mg Intravenous Q T,Th,Sa-HD Catarina Hartshorn, MD         ALLERGIES Patient has no known allergies.  MEDICAL HISTORY Past Medical History:  Diagnosis Date   Anemia    Cancer (HCC)    right renal . Prostate- Radiation treatment   CHF (congestive heart failure) (HCC)    Chronic kidney disease    Dialysis T/TH/Sa   Edema    Heart murmur    "nothing to worry about"   Hyperlipidemia    Hypertension    Malignant carcinoid tumor (HCC) 01/03/2016   Pneumonia    as a child     SOCIAL HISTORY Social History   Socioeconomic History   Marital status: Married    Spouse name: Not on file   Number of children: Not on file   Years of education: Not on file   Highest education level: Not on file  Occupational History   Not on file  Tobacco Use   Smoking status: Never   Smokeless tobacco: Never  Vaping Use   Vaping status: Never Used  Substance and Sexual Activity   Alcohol use: No    Alcohol/week: 0.0 standard drinks of alcohol   Drug use: No   Sexual activity: Not on file  Other Topics Concern   Not on file  Social History Narrative   Not on file   Social Drivers of Health    Financial Resource Strain: Low Risk  (05/24/2020)   Overall Financial Resource Strain (CARDIA)    Difficulty of Paying Living Expenses: Not hard at all  Food Insecurity: No Food Insecurity (10/03/2023)   Hunger Vital Sign    Worried About Running Out of Food in the Last Year: Never true    Ran Out of Food in the Last Year: Never true  Transportation Needs: No Transportation Needs (10/03/2023)   PRAPARE - Administrator, Civil Service (Medical): No    Lack of Transportation (Non-Medical): No  Physical Activity: Insufficiently Active (05/24/2020)   Exercise Vital Sign    Days of Exercise per Week: 7 days    Minutes of Exercise per Session: 10 min  Stress: No Stress Concern Present (05/24/2020)   [employer] of Occupational Health - Occupational Stress Questionnaire    Feeling of Stress : Not at all  Social Connections: Unknown (10/03/2023)   Social Connection and Isolation Panel [NHANES]  Frequency of Communication with Friends and Family: Twice a week    Frequency of Social Gatherings with Friends and Family: Not on file    Attends Religious Services: More than 4 times per year    Active Member of [employer] or Organizations: Yes    Attends Banker Meetings: 1 to 4 times per year    Marital Status: Married  Catering manager Violence: Not At Risk (10/03/2023)   Humiliation, Afraid, Rape, and Kick questionnaire    Fear of Current or Ex-Partner: No    Emotionally Abused: No    Physically Abused: No    Sexually Abused: No     FAMILY HISTORY Family History  Problem Relation Age of Onset   Cancer Mother    Hypertension Father    Cancer Sister      Review of Systems: 12 systems were reviewed and negative except per HPI  Physical Exam: Vitals:   10/04/23 0538 10/04/23 0600  BP: (!) 108/52 (!) 109/56  Pulse:  96  Resp: 17 15  Temp:    SpO2:  98%   No intake/output data recorded.  Intake/Output Summary (Last 24 hours) at 10/04/2023 0757 Last data  filed at 10/04/2023 0300 Gross per 24 hour  Intake --  Output 450 ml  Net -450 ml   General: NAD, ill appearing HEENT: anicteric sclera, MMM CV: normal rate, no murmurs, trace edema bl les Lungs: bilateral chest rise, normal wob, cta bl Abd: soft, non-tender, non-distended Skin: no visible lesions or rashes Psych: alert, engaged, appropriate mood and affect Neuro: normal speech, no gross focal deficits  Dialysis access: aneurysmal lue avf +b/t  Test Results Reviewed Lab Results  Component Value Date   NA 139 10/04/2023   K 3.8 10/04/2023   CL 99 10/04/2023   CO2 24 10/04/2023   BUN 43 (H) 10/04/2023   CREATININE 7.43 (H) 10/04/2023   CALCIUM 8.2 (L) 10/04/2023   ALBUMIN 2.6 (L) 10/04/2023   PHOS 4.0 10/04/2023    I have reviewed relevant outside healthcare records

## 2023-10-04 NOTE — Progress Notes (Addendum)
 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  The patient spouse is at the bedside to supplement the history.  She states that the patient has had a gradual functional decline over the past 3 to 4 months.  She states that he has lost 100 pounds in the past 6 months.  The patient frequently misses dialysis because of generalized weakness and just not wanting to go.  His last dialysis session was on 09/29/2023.  She states that the patient only had 1 dialysis session all last week prior to this admission.  She states that his dialysis sessions are sometimes cut off short because of his low blood pressure. Apparently the patient lost his balance and fell onto the floor on the morning of 10/03/2023.  He had difficulty getting up.  As result, the patient was brought to emergency department for further evaluation.  The patient himself denies any chest pain, shortness breath, abdominal pain, headache, neck pain.  He denies any fevers or chills.  He states that he has chronic loose stools.  On the average, the patient has 2-3 loose bowel movements on a daily basis.  He states that he has been having dry heaves on a daily basis at least for the past 2 to 3 months.  His appetite has been poor.  He denies any new medications.  He is having difficulty keeping his medications down at home. The patient states that he occasionally has some hematochezia with his bowel movements.  He denies any melena or hematemesis. In the ED, the patient was hypothermic initially with a temperature of 94.7 F.  He was hypotensive with SBP in the 70s initially.  The patient was given 1 L of fluids with some improvement.  WBC 7.8, hemoglobin 9.9, platelets 220.  Sodium 139, potassium 3.9,  bicarbonate 20, serum creatinine 7.05.  AST 43, ALT 12, alk phosphatase 26, total bilirubin 2.0. Chest x-ray was negative for any infiltrates.  Lactic acid 7.5.  EKG showed sinus tachycardia.   Assessment/Plan: Generalized weakness -Multifactorial including volume depletion, progression of his malignancy, and possible infectious process -Patient is anuric so urine sample unable to be collected -continue empiric vancomycin and cefepime -B12--782 -TSH--8.445 -Folic acid--4.6   Hypotension/Hypothermia -Likely related to volume depletion although concerned about underlying infectious process -Started empiric Antibiotics -Check PCT 0.22 -Continue judicious IV fluids -am cortisol--20.9 -CT AP--increase central necrosis with hepatic mets; mild intrahepatic and extrahepatic ductal dilatation with filling defects in CBD; omental and mesenteric nodularity; rectal wall thickening -3/19 blood cultures neg  Dilated biliary ducts -concerned about choledocholithiasis on CT -obtain MRCP   Diarrhea -Related to the patient's carcinoid tumor -Check C. Difficile--neg -Stool pathogen panel--pending   Intractable vomiting -Start around-the-clock Compazine -Start pantoprazole -improving   Lactic acidosis -Suspect decreased clearance in the setting of ESRD -Empiric antibiotics as discussed above -Judicious IV fluids -Repeat lactic acid 7.5>>1.4 -Check VBG 7.27/40/<31/18   Metastatic carcinoid tumor to the peritoneum and liver -No longer a candidate for further therapy   ESRD -Nephrology consult for maintenance dialysis -Normally dialyzes TTS in [place] -Last dialysis 09/29/2023--patient dialyzed 2 hours -nephrology consulted for HD--spike with Dr. Romelle Starcher  Rectal bleeding -GI consulted        Family Communication:  wife at bedside 3/20  Consultants:  renal  Code Status:   DNR  DVT Prophylaxis:  [place] Heparin    Procedures: As Listed in Progress Note  Above  Antibiotics: Vanc 3/20>> Cefepime 3/20>>     Subjective: Pt states abd pain and n/v are improving.  Denies f/c, cp, sob.  Still having loose stools with small volume hematochezia  Objective: Vitals:   10/04/23 1200 10/04/23 1300 10/04/23 1400 10/04/23 1500  BP: 113/77 (!) 126/56 (!) 114/50 114/62  Pulse: (!) 102 98 93   Resp: (!) 22 17 16 16   Temp:      TempSrc:      SpO2: 99% 95% 95% 91%  Weight:      Height:        Intake/Output Summary (Last 24 hours) at 10/04/2023 1542 Last data filed at 10/04/2023 1200 Gross per 24 hour  Intake 1107.22 ml  Output 450 ml  Net 657.22 ml   Weight change:  Exam:  General:  Pt is alert, follows commands appropriately, not in acute distress HEENT: No icterus, No thrush, No neck mass, [place]/AT Cardiovascular: RRR, S1/S2, no rubs, no gallops Respiratory: bilateral rales.  No wheeze Abdomen: Soft/+BS, non tender, non distended, no guarding Extremities: +LEedema, No lymphangitis, No petechiae, No rashes, no synovitis   Data Reviewed: I have personally reviewed following labs and imaging studies Basic Metabolic Panel: Recent Labs  Lab 10/03/23 1216 10/04/23 0505  NA 139 139  K 3.9 3.8  CL 94* 99  CO2 20* 24  GLUCOSE 108* 79  BUN 36* 43*  CREATININE 7.05* 7.43*  CALCIUM 9.1 8.2*  PHOS  --  4.0   Liver Function Tests: Recent Labs  Lab 10/03/23 1216 10/04/23 0505 10/04/23 0509  AST 43*  --  59*  ALT 12  --  12  ALKPHOS 76  --  58  BILITOT 2.0*  --  1.5*  PROT 6.4*  --  5.1*  ALBUMIN 3.2* 2.6* 2.6*   Recent Labs  Lab 10/03/23 1559  LIPASE 25   No results for input(s): "AMMONIA" in the last 168 hours. Coagulation Profile: No results for input(s): "INR", "PROTIME" in the last 168 hours. CBC: Recent Labs  Lab 10/03/23 1216 10/04/23 0509  WBC 7.8 8.5  NEUTROABS 7.3  --   HGB 11.9* 9.7*  HCT 38.5* 31.2*  MCV 97.0 95.1  PLT 220 140*   Cardiac Enzymes: Recent Labs  Lab 10/03/23 1559  CKTOTAL 74    BNP: Invalid input(s): "POCBNP" CBG: No results for input(s): "GLUCAP" in the last 168 hours. HbA1C: No results for input(s): "HGBA1C" in the last 72 hours. Urine analysis: No results found for: "COLORURINE", "APPEARANCEUR", "LABSPEC", "PHURINE", "GLUCOSEU", "HGBUR", "BILIRUBINUR", "KETONESUR", "PROTEINUR", "UROBILINOGEN", "NITRITE", "LEUKOCYTESUR" Sepsis Labs: @LABRCNTIP (procalcitonin:4,lacticidven:4) ) Recent Results (from the past 240 hours)  Culture, blood (routine x 2)     Status: None (Preliminary result)   Collection Time: 10/03/23 12:16 PM   Specimen: BLOOD  Result Value Ref Range Status   Specimen Description BLOOD BLOOD RIGHT ARM  Final   Special Requests   Final    BOTTLES DRAWN AEROBIC AND ANAEROBIC Blood Culture results may not be optimal due to an inadequate volume of blood received in culture bottles   Culture   Final    NO GROWTH < 24 HOURS Performed at [facility], [address], [place], [place] [address]    Report Status PENDING  Incomplete  Culture, blood (routine x 2)     Status: None (Preliminary  result)   Collection Time: 10/03/23 12:16 PM   Specimen: BLOOD  Result Value Ref Range Status   Specimen Description BLOOD RFOA  Final   Special Requests   Final    Blood Culture results may not be optimal due to an inadequate volume of blood received in culture bottles BOTTLES DRAWN AEROBIC ONLY   Culture   Final    NO GROWTH < 24 HOURS Performed at [facility], [address], [place], [place] [address]    Report Status PENDING  Incomplete  Resp panel by RT-PCR (RSV, Flu A&B, Covid) Anterior Nasal Swab     Status: None   Collection Time: 10/03/23  7:33 PM   Specimen: Anterior Nasal Swab  Result Value Ref Range Status   SARS Coronavirus 2 by RT PCR NEGATIVE NEGATIVE Final    Comment: (NOTE) SARS-CoV-2 target nucleic acids are NOT DETECTED.  The SARS-CoV-2 RNA is generally detectable in upper respiratory specimens during the acute phase of infection.  The lowest concentration of SARS-CoV-2 viral copies this assay can detect is 138 copies/mL. A negative result does not preclude SARS-Cov-2 infection and should not be used as the sole basis for treatment or other patient management decisions. A negative result may occur with  improper specimen collection/handling, submission of specimen other than nasopharyngeal swab, presence of viral mutation(s) within the areas targeted by this assay, and inadequate number of viral copies(<138 copies/mL). A negative result must be combined with clinical observations, patient history, and epidemiological information. The expected result is Negative.  Fact Sheet for Patients:  BloggerCourse.com  Fact Sheet for Healthcare Providers:  SeriousBroker.it  This test is no t yet approved or cleared by the [place] FDA and  has been authorized for detection and/or diagnosis of SARS-CoV-2 by FDA under an Emergency Use Authorization (EUA). This EUA will remain  in effect (meaning this test can be used) for the duration of the COVID-19 declaration under Section 564(b)(1) of the Act, 21 U.S.C.section 360bbb-3(b)(1), unless the authorization is terminated  or revoked sooner.       Influenza A by PCR NEGATIVE NEGATIVE Final   Influenza B by PCR NEGATIVE NEGATIVE Final    Comment: (NOTE) The Xpert Xpress SARS-CoV-2/FLU/RSV plus assay is intended as an aid in the diagnosis of influenza from Nasopharyngeal swab specimens and should not be used as a sole basis for treatment. Nasal washings and aspirates are unacceptable for Xpert Xpress SARS-CoV-2/FLU/RSV testing.  Fact Sheet for Patients: BloggerCourse.com  Fact Sheet for Healthcare Providers: SeriousBroker.it  This test is not yet approved or cleared by the [place] FDA and has been authorized for detection and/or diagnosis of SARS-CoV-2 by FDA  under an Emergency Use Authorization (EUA). This EUA will remain in effect (meaning this test can be used) for the duration of the COVID-19 declaration under Section 564(b)(1) of the Act, 21 U.S.C. section 360bbb-3(b)(1), unless the authorization is terminated or revoked.     Resp Syncytial Virus by PCR NEGATIVE NEGATIVE Final    Comment: (NOTE) Fact Sheet for Patients: BloggerCourse.com  Fact Sheet for Healthcare Providers: SeriousBroker.it  This test is not yet approved or cleared by the [place] FDA and has been authorized for detection and/or diagnosis of SARS-CoV-2 by FDA under an Emergency Use Authorization (EUA). This EUA will remain in effect (meaning this test can be used) for the duration of the COVID-19 declaration under Section 564(b)(1) of the Act, 21 U.S.C. section 360bbb-3(b)(1), unless the authorization is terminated or revoked.  Performed at  [facility], [address] [place], [place] [address]   C Difficile Quick Screen w PCR reflex     Status: None   Collection Time: 10/03/23  7:57 PM   Specimen: Stool  Result Value Ref Range Status   C Diff antigen NEGATIVE NEGATIVE Final   C Diff toxin NEGATIVE NEGATIVE Final   C Diff interpretation No C. difficile detected.  Final    Comment: Performed at [facility], [address] [place], [place] [address]  MRSA Next Gen by PCR, Nasal     Status: None   Collection Time: 10/03/23  8:27 PM   Specimen: Nasal Mucosa; Nasal Swab  Result Value Ref Range Status   MRSA by PCR Next Gen NOT DETECTED NOT DETECTED Final    Comment: (NOTE) The GeneXpert MRSA Assay (FDA approved for NASAL specimens only), is one component of a comprehensive MRSA colonization surveillance program. It is not intended to diagnose MRSA infection nor to guide or monitor treatment for MRSA infections. Test performance is not FDA approved in patients less than 31 years old. Performed at [facility], [address] [place], [place] [address]      Scheduled Meds:  Chlorhexidine Gluconate Cloth  6 each Topical Q0600   Chlorhexidine Gluconate Cloth  6 each Topical Q0600   heparin  5,000 Units Subcutaneous Q8H   midodrine  5 mg Oral TID WC   mirtazapine  7.5 mg Oral QHS   pantoprazole (PROTONIX) IV  40 mg Intravenous Q12H   prochlorperazine  5 mg Intravenous Q6H   sodium bicarbonate  650 mg Oral TID   Continuous Infusions:  ceFEPime (MAXIPIME) IV     vancomycin      Procedures/Studies: CT ABDOMEN PELVIS W CONTRAST Result Date: 10/03/2023 CLINICAL DATA:  Metastatic carcinoid tumor weakness. Mechanical fall. Abdominal pain. Sepsis. * Tracking Code: BO * EXAM: CT ABDOMEN AND PELVIS WITH CONTRAST TECHNIQUE: Multidetector CT imaging of the abdomen and pelvis was performed using the standard protocol following bolus administration of intravenous contrast. RADIATION DOSE REDUCTION: This exam was performed according to the departmental dose-optimization program which includes automated exposure control, adjustment of the mA and/or kV according to patient size and/or use of iterative reconstruction technique. CONTRAST:  OMNIPAQUE IOHEXOL 300 MG/ML  SOLN COMPARISON:  09/20/2023 FINDINGS: Lower chest: Small left pleural effusion. Hepatobiliary: Increased central necrosis and hypodensity associated with scattered metastatic lesions throughout the liver. This includes varicoid lucency in a 9.4 cm left hepatic lobe metastatic lesion. Mild intrahepatic biliary dilatation along with extrahepatic biliary dilatation extending to a cluster of spell to distal CBD filling defects each measuring about 0.4 cm in diameter shown for example on image 37 series 4 and image 29 series 2, suspicious for choledocholithiasis. Mildly distended gallbladder. Pancreas: Unremarkable Spleen: Unremarkable Adrenals/Urinary Tract: Absent right kidney. Small left kidney unchanged in appearance from the recent prior exam.  Wall thickening along the urinary bladder, cystitis not excluded although some of this may be from nondistention. No hydronephrosis or hydroureter. Stomach/Bowel: Contrast medium in stool in the mildly distended rectum, with substantial indistinctly marginated rectal wall thickening. Cannot exclude stercoral colitis or proctitis. This has worsened compared to previous. No dilated bowel. Vascular/Lymphatic: Minimal iliac artery atheromatous vascular calcification. Reproductive: Fiducials along the posterior margin of the prostate gland. Other: Omental and mesenteric nodularity compatible with tumor. This is most confluent in the periumbilical region or bilobed tumor is present within and underlying the umbilicus measuring 2.7 by 2.6 cm on image 43 series 2,  stable in appearance. There is tumor with accentuated neovascularity along the right paracolic gutter just below the right hepatic lobe. Subcutaneous and mesenteric edema compatible with progressive third spacing of fluids. Musculoskeletal: Old healed left pelvic fractures. Lower lumbar spondylosis and degenerative disc disease with fusion between the L5 and S1 vertebral bodies. IMPRESSION: 1. Increased central necrosis and hypodensity associated with scattered metastatic lesions throughout the liver. 2. Mild intrahepatic and extrahepatic biliary dilatation extending to a cluster of two filling defects in the distal CBD each measuring about 0.4 cm in diameter, compatible with choledocholithiasis. 3. Omental and mesenteric nodularity compatible with tumor, similar to previous. 4. Contrast medium and stool in the mildly distended rectum, with substantial indistinctly marginated rectal wall thickening. Cannot exclude stercoral colitis or proctitis. This has worsened compared to previous. 5. Wall thickening along the urinary bladder, cystitis not excluded although some of this may be from nondistention. 6. Small left pleural effusion. 7. Subcutaneous and mesenteric  edema compatible with progressive third spacing of fluids. 8. Old healed left pelvic fractures. Electronically Signed   By: Gaylyn Rong M.D.   On: 10/03/2023 18:26   DG Chest Portable 1 View Result Date: 10/03/2023 CLINICAL DATA:  Altered mental status. EXAM: PORTABLE CHEST 1 VIEW COMPARISON:  09/20/2023. FINDINGS: Bilateral lung fields are clear. Bilateral costophrenic angles are clear. Normal cardio-mediastinal silhouette. No acute osseous abnormalities. The soft tissues are within normal limits. IMPRESSION: No active disease. Electronically Signed   By: Jules Schick M.D.   On: 10/03/2023 14:15   CT ABDOMEN PELVIS W CONTRAST Result Date: 09/20/2023 CLINICAL DATA:  Acute abdominal pain and recent missed dialysis sessions, initial encounter EXAM: CT ABDOMEN AND PELVIS WITH CONTRAST TECHNIQUE: Multidetector CT imaging of the abdomen and pelvis was performed using the standard protocol following bolus administration of intravenous contrast. RADIATION DOSE REDUCTION: This exam was performed according to the departmental dose-optimization program which includes automated exposure control, adjustment of the mA and/or kV according to patient size and/or use of iterative reconstruction technique. CONTRAST:  OMNIPAQUE IOHEXOL 300 MG/ML  SOLN COMPARISON:  08/23/2023 FINDINGS: Lower chest: No acute abnormality. Hepatobiliary: Liver again demonstrates peripherally enhancing metastatic lesions which appear roughly similar to that seen on the prior exam but better visualized due to the contrast enhancement. The gallbladder is well distended. Single dependent gallstone is seen. Cystic duct and common bile duct are dilated no definitive choledocholithiasis is seen. Pancreas: Mild dilatation of the pancreatic duct is noted. No other focal pancreas abnormality is noted. Spleen: Normal in size without focal abnormality. Adrenals/Urinary Tract: Adrenal glands are within normal limits. Changes of prior right  nephrectomy are again noted. Visualized left kidney shows normal enhancement. Cystic changes are seen. A focal hypodense lesion is again identified stable in appearance from the prior study. Bladder is decompressed. Stomach/Bowel: No obstructive or inflammatory changes of the colon are noted. The appendix is within normal limits. Mild diverticular change of the colon is noted. Stomach and small bowel are within normal limits. Vascular/Lymphatic: Aortic atherosclerosis. 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 This measures approximately 2.9 cm in greatest dimension. Preaortic node is noted on image number 26 of series 2 similar to that noted on prior PET-CT. Prominent right iliac node is noted on image number 56 of series 2 corresponding the prior PET-CT. Similar enhancing lesion is noted adjacent to the tip of the liver also stable from the prior PET-CT. Reproductive: Prostate is unremarkable. Other:  Small fluid containing right inguinal hernia is noted new from the prior study. Small umbilical hernia is identified containing enhancing tissue consistent with the known metastatic lesions. no significant free fluid is noted. Musculoskeletal: Degenerative changes of lumbar spine are noted. No acute bony abnormality is seen. 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 Metastatic lesions are noted herniating into a small umbilical hernia. Fluid containing right inguinal hernia. Status post right nephrectomy. Cholelithiasis without choledocholithiasis. Prominence of the common bile duct and cystic duct are seen. Electronically Signed   By: Alcide Clever M.D.   On: 09/20/2023 19:17   DG Chest Port 1 View Result Date: 09/20/2023 CLINICAL DATA:  Shortness of breath. EXAM: PORTABLE CHEST 1 VIEW  COMPARISON:  PET-CT dated 08/08/2023 FINDINGS: Normal sized heart. Tortuous and partially calcified thoracic aorta. Clear lungs with normal vascularity. Diffuse osteopenia and mild thoracic spine degenerative changes. IMPRESSION: No acute abnormality. Electronically Signed   By: Beckie Salts M.D.   On: 09/20/2023 16:50    Catarina Hartshorn, DO  [facility]  If 7PM-7AM, please contact night-coverage [email or web address] Password [email or web address] 10/04/2023, 3:42 PM   LOS: 1 day

## 2023-10-05 DIAGNOSIS — K625 Hemorrhage of anus and rectum: Secondary | ICD-10-CM

## 2023-10-05 DIAGNOSIS — D649 Anemia, unspecified: Secondary | ICD-10-CM | POA: Diagnosis not present

## 2023-10-05 DIAGNOSIS — N186 End stage renal disease: Secondary | ICD-10-CM | POA: Diagnosis not present

## 2023-10-05 DIAGNOSIS — K8050 Calculus of bile duct without cholangitis or cholecystitis without obstruction: Secondary | ICD-10-CM | POA: Diagnosis not present

## 2023-10-05 DIAGNOSIS — K838 Other specified diseases of biliary tract: Secondary | ICD-10-CM | POA: Diagnosis not present

## 2023-10-05 DIAGNOSIS — R197 Diarrhea, unspecified: Secondary | ICD-10-CM | POA: Diagnosis not present

## 2023-10-05 DIAGNOSIS — I9589 Other hypotension: Secondary | ICD-10-CM | POA: Diagnosis not present

## 2023-10-05 DIAGNOSIS — C7B00 Secondary carcinoid tumors, unspecified site: Secondary | ICD-10-CM | POA: Diagnosis not present

## 2023-10-05 LAB — CBC
HCT: 27.2 % — ABNORMAL LOW (ref 39.0–52.0)
Hemoglobin: 8.7 g/dL — ABNORMAL LOW (ref 13.0–17.0)
MCH: 29.8 pg (ref 26.0–34.0)
MCHC: 32 g/dL (ref 30.0–36.0)
MCV: 93.2 fL (ref 80.0–100.0)
Platelets: 97 10*3/uL — ABNORMAL LOW (ref 150–400)
RBC: 2.92 MIL/uL — ABNORMAL LOW (ref 4.22–5.81)
RDW: 15.4 % (ref 11.5–15.5)
WBC: 8 10*3/uL (ref 4.0–10.5)
nRBC: 0 % (ref 0.0–0.2)

## 2023-10-05 LAB — COMPREHENSIVE METABOLIC PANEL
ALT: 10 U/L (ref 0–44)
AST: 38 U/L (ref 15–41)
Albumin: 2.6 g/dL — ABNORMAL LOW (ref 3.5–5.0)
Alkaline Phosphatase: 53 U/L (ref 38–126)
Anion gap: 13 (ref 5–15)
BUN: 29 mg/dL — ABNORMAL HIGH (ref 8–23)
CO2: 26 mmol/L (ref 22–32)
Calcium: 7.6 mg/dL — ABNORMAL LOW (ref 8.9–10.3)
Chloride: 98 mmol/L (ref 98–111)
Creatinine, Ser: 4.77 mg/dL — ABNORMAL HIGH (ref 0.61–1.24)
GFR, Estimated: 12 mL/min — ABNORMAL LOW (ref >60)
Glucose, Bld: 79 mg/dL (ref 70–99)
Potassium: 3.7 mmol/L (ref 3.5–5.1)
Sodium: 137 mmol/L (ref 135–145)
Total Bilirubin: 1.4 mg/dL — ABNORMAL HIGH (ref 0.0–1.2)
Total Protein: 4.9 g/dL — ABNORMAL LOW (ref 6.5–8.1)

## 2023-10-05 LAB — HEPATITIS B SURFACE ANTIBODY, QUANTITATIVE: Hep B S AB Quant (Post): 46.4 m[IU]/mL

## 2023-10-05 NOTE — Progress Notes (Addendum)
 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  The patient spouse is at the bedside to supplement the history.  She states that the patient has had a gradual functional decline over the past 3 to 4 months.  She states that he has lost 100 pounds in the past 6 months.  The patient frequently misses dialysis because of generalized weakness and just not wanting to go.  His last dialysis session was on 09/29/2023.  She states that the patient only had 1 dialysis session all last week prior to this admission.  She states that his dialysis sessions are sometimes cut off short because of his low blood pressure. Apparently the patient lost his balance and fell onto the floor on the morning of 10/03/2023.  He had difficulty getting up.  As result, the patient was brought to emergency department for further evaluation.  The patient himself denies any chest pain, shortness breath, abdominal pain, headache, neck pain.  He denies any fevers or chills.  He states that he has chronic loose stools.  On the average, the patient has 2-3 loose bowel movements on a daily basis.  He states that he has been having dry heaves on a daily basis at least for the past 2 to 3 months.  His appetite has been poor.  He denies any new medications.  He is having difficulty keeping his medications down at home. The patient states that he occasionally has some hematochezia with his bowel movements.  He denies any melena or hematemesis. In the ED, the patient was hypothermic initially with a temperature of 94.7 F.  He was hypotensive with SBP in the 70s initially.  The patient was given 1 L of fluids with some improvement.  WBC 7.8, hemoglobin 9.9, platelets 220.  Sodium 139, potassium 3.9,  bicarbonate 20, serum creatinine 7.05.  AST 43, ALT 12, alk phosphatase 26, total bilirubin 2.0. Chest x-ray was negative for any infiltrates.  Lactic acid 7.5.  EKG showed sinus tachycardia. The patient was fluid resuscitated and started on midodrine with some improvement in his blood pressure.  However, the patient still had difficulty tolerating dialysis secondary to hypotension. The case was discussed with nephrology who felt that the patient is a poor candidate for further dialysis in the setting of his metastatic cancer and poor tolerance of dialysis at this point. During the hospitalization, CT of the abdomen and pelvis continue to showed peritoneal carcinomatosis and it also showed choledocholithiasis.  MRCP was obtained and also suggested choledocholithiasis. The patient developed rectal bleeding during hospitalization.  GI was consulted.  Given the patient's poor baseline functioning, frailty, metastatic cancer, and seriousness of the acute medical conditions goals of care discussions were held with the patient's spouse and patient.  Ultimately, it was determined in the patient's best interest to change his focus of care to focus on comfort measures.    Assessment/Plan: Generalized weakness -Multifactorial including volume depletion, progression of his malignancy, and possible infectious process -Patient is anuric so urine sample unable to be collected -continue empiric vancomycin and cefepime -B12--782 -TSH--8.445 -Folic acid--4.6 -10/05/23--after GOC discussion, patient's focus of care transitioned to comfort measures.   Hypotension/Hypothermia -Likely related to volume depletion although concerned about underlying infectious process -Started empiric  Antibiotics -midodrine started -Check PCT 0.22 -Continue judicious IV fluids -am cortisol--20.9 -CT AP--increase central necrosis with hepatic mets; mild intrahepatic and extrahepatic ductal dilatation with filling defects in CBD;  omental and mesenteric nodularity; rectal wall thickening -3/19 blood cultures neg -10/05/23--after GOC discussion, patient's focus of care transitioned to comfort measures.   Dilated biliary ducts -concerned about choledocholithiasis on CT -3/20 MRCP--Widespread hepatic metastatic disease; Mild intra and extrahepatic biliary dilatation. The small partially calcified stones in the distal common bile duct on recent CT are not well seen on this examination due to motion artifact, although are grossly unchanged; peritoneal carcinomatosis -10/05/23--after GOC discussion, patient's focus of care transitioned to comfort measures.   Diarrhea -Related to the patient's carcinoid tumor -Check C. Difficile--neg -Stool pathogen panel--pending   Intractable vomiting -Started around-the-clock Compazine -Initially started pantoprazole -improving -10/05/23--after GOC discussion, patient's focus of care transitioned to comfort measures.   Lactic acidosis -Suspect decreased clearance in the setting of ESRD -Empiric antibiotics as discussed above -Judicious IV fluids -Repeat lactic acid 7.5>>1.4 -Check VBG 7.27/40/<31/18   Metastatic carcinoid tumor to the peritoneum and liver -No longer a candidate for further therapy   ESRD -Nephrology consult for maintenance dialysis -Normally dialyzes TTS in [place] -Last dialysis 09/29/2023--patient dialyzed 2 hours -nephrology consulted for HD--spoke with Dr. Romelle Starcher -pt is not tolerating HD due to hypotension -10/05/23--after GOC discussion, patient's focus of care transitioned to comfort measures.   Rectal bleeding -GI consult appreicated -spouse does not want any further invasive procedures  Goals of Care -discussed GOC daily with spouse -we discussed pt's overall poor prognosis in the setting of his progressive metastatic cancer, frailty, poor functional baseline and multiple acute medical issues including but not limited to his GI bleed,  choledocholithiasis and intolerance of dialysis -She expressed that pt has stated multiple times that patient that he is "tired and ready to be done with it" -She and patient do wish any further pain and suffering and wish to transition focus of care to focus of full comfort.               Family Communication:   wife at bedside 3/21   Consultants:  renal   Code Status:   DNR   DVT Prophylaxis:  SCDs     Procedures: As Listed in Progress Note Above   Antibiotics: Vanc 3/20>>3/21 Cefepime 3/20>>3/21      Total time spent 50 minutes.  Greater than 50% spent face to face counseling and coordinating care.   Subjective: Pt feels that abd pain is better.  N/v are better.  Denies f/c, cp, sob,  Objective: Vitals:   10/05/23 0900 10/05/23 1000 10/05/23 1100 10/05/23 1132  BP: 124/75 124/65 120/70   Pulse:   77   Resp: 19 16 19    Temp:    97.8 F (36.6 C)  TempSrc:    Axillary  SpO2:   99%   Weight:      Height:        Intake/Output Summary (Last 24 hours) at 10/05/2023 1258 Last data filed at 10/05/2023 0106 Gross per 24 hour  Intake 538.11 ml  Output 300 ml  Net 238.11 ml   Weight change: -4.5 kg Exam:  General:  Pt is alert, follows commands appropriately, not in acute distress HEENT: No icterus, No thrush, No neck mass, [place]/AT Cardiovascular: RRR, S1/S2, no rubs, no gallops Respiratory: bibasilar rales.  No wheeze Abdomen: Soft/+BS, non tender, non distended, no guarding Extremities: No edema, No lymphangitis, No petechiae,  No rashes, no synovitis   Data Reviewed: I have personally reviewed following labs and imaging studies Basic Metabolic Panel: Recent Labs  Lab 10/03/23 1216 10/04/23 0505 10/05/23 0513  NA 139 139 137  K 3.9 3.8 3.7  CL 94* 99 98  CO2 20* 24 26  GLUCOSE 108* 79 79  BUN 36* 43* 29*  CREATININE 7.05* 7.43* 4.77*  CALCIUM 9.1 8.2* 7.6*  PHOS  --  4.0  --    Liver Function Tests: Recent Labs  Lab 10/03/23 1216  10/04/23 0505 10/04/23 0509 10/05/23 0513  AST 43*  --  59* 38  ALT 12  --  12 10  ALKPHOS 76  --  58 53  BILITOT 2.0*  --  1.5* 1.4*  PROT 6.4*  --  5.1* 4.9*  ALBUMIN 3.2* 2.6* 2.6* 2.6*   Recent Labs  Lab 10/03/23 1559  LIPASE 25   No results for input(s): "AMMONIA" in the last 168 hours. Coagulation Profile: No results for input(s): "INR", "PROTIME" in the last 168 hours. CBC: Recent Labs  Lab 10/03/23 1216 10/04/23 0509 10/05/23 0513  WBC 7.8 8.5 8.0  NEUTROABS 7.3  --   --   HGB 11.9* 9.7* 8.7*  HCT 38.5* 31.2* 27.2*  MCV 97.0 95.1 93.2  PLT 220 140* 97*   Cardiac Enzymes: Recent Labs  Lab 10/03/23 1559  CKTOTAL 74   BNP: Invalid input(s): "POCBNP" CBG: No results for input(s): "GLUCAP" in the last 168 hours. HbA1C: No results for input(s): "HGBA1C" in the last 72 hours. Urine analysis: No results found for: "COLORURINE", "APPEARANCEUR", "LABSPEC", "PHURINE", "GLUCOSEU", "HGBUR", "BILIRUBINUR", "KETONESUR", "PROTEINUR", "UROBILINOGEN", "NITRITE", "LEUKOCYTESUR" Sepsis Labs: @LABRCNTIP (procalcitonin:4,lacticidven:4) ) Recent Results (from the past 240 hours)  Culture, blood (routine x 2)     Status: None (Preliminary result)   Collection Time: 10/03/23 12:16 PM   Specimen: BLOOD  Result Value Ref Range Status   Specimen Description BLOOD BLOOD RIGHT ARM  Final   Special Requests   Final    BOTTLES DRAWN AEROBIC AND ANAEROBIC Blood Culture results may not be optimal due to an inadequate volume of blood received in culture bottles   Culture   Final    NO GROWTH 2 DAYS Performed at [facility], [address] [place], [place] [address]    Report Status PENDING  Incomplete  Culture, blood (routine x 2)     Status: None (Preliminary result)   Collection Time: 10/03/23 12:16 PM   Specimen: BLOOD  Result Value Ref Range Status   Specimen Description BLOOD RFOA  Final   Special Requests   Final    Blood Culture results may not be optimal due to an  inadequate volume of blood received in culture bottles BOTTLES DRAWN AEROBIC ONLY   Culture   Final    NO GROWTH 2 DAYS Performed at [facility], [address] [place], [place] [address]    Report Status PENDING  Incomplete  Resp panel by RT-PCR (RSV, Flu A&B, Covid) Anterior Nasal Swab     Status: None   Collection Time: 10/03/23  7:33 PM   Specimen: Anterior Nasal Swab  Result Value Ref Range Status   SARS Coronavirus 2 by RT PCR NEGATIVE NEGATIVE Final    Comment: (NOTE) SARS-CoV-2 target nucleic acids are NOT DETECTED.  The SARS-CoV-2 RNA is generally detectable in upper respiratory specimens during the acute phase of infection. The lowest concentration of SARS-CoV-2 viral copies this assay can detect is 138 copies/mL. A negative result does  not preclude SARS-Cov-2 infection and should not be used as the sole basis for treatment or other patient management decisions. A negative result may occur with  improper specimen collection/handling, submission of specimen other than nasopharyngeal swab, presence of viral mutation(s) within the areas targeted by this assay, and inadequate number of viral copies(<138 copies/mL). A negative result must be combined with clinical observations, patient history, and epidemiological information. The expected result is Negative.  Fact Sheet for Patients:  BloggerCourse.com  Fact Sheet for Healthcare Providers:  SeriousBroker.it  This test is no t yet approved or cleared by the [place] FDA and  has been authorized for detection and/or diagnosis of SARS-CoV-2 by FDA under an Emergency Use Authorization (EUA). This EUA will remain  in effect (meaning this test can be used) for the duration of the COVID-19 declaration under Section 564(b)(1) of the Act, 21 U.S.C.section 360bbb-3(b)(1), unless the authorization is terminated  or revoked sooner.       Influenza A by PCR NEGATIVE NEGATIVE  Final   Influenza B by PCR NEGATIVE NEGATIVE Final    Comment: (NOTE) The Xpert Xpress SARS-CoV-2/FLU/RSV plus assay is intended as an aid in the diagnosis of influenza from Nasopharyngeal swab specimens and should not be used as a sole basis for treatment. Nasal washings and aspirates are unacceptable for Xpert Xpress SARS-CoV-2/FLU/RSV testing.  Fact Sheet for Patients: BloggerCourse.com  Fact Sheet for Healthcare Providers: SeriousBroker.it  This test is not yet approved or cleared by the [place] FDA and has been authorized for detection and/or diagnosis of SARS-CoV-2 by FDA under an Emergency Use Authorization (EUA). This EUA will remain in effect (meaning this test can be used) for the duration of the COVID-19 declaration under Section 564(b)(1) of the Act, 21 U.S.C. section 360bbb-3(b)(1), unless the authorization is terminated or revoked.     Resp Syncytial Virus by PCR NEGATIVE NEGATIVE Final    Comment: (NOTE) Fact Sheet for Patients: BloggerCourse.com  Fact Sheet for Healthcare Providers: SeriousBroker.it  This test is not yet approved or cleared by the [place] FDA and has been authorized for detection and/or diagnosis of SARS-CoV-2 by FDA under an Emergency Use Authorization (EUA). This EUA will remain in effect (meaning this test can be used) for the duration of the COVID-19 declaration under Section 564(b)(1) of the Act, 21 U.S.C. section 360bbb-3(b)(1), unless the authorization is terminated or revoked.  Performed at [facility], [address], [place], [place] [address]   C Difficile Quick Screen w PCR reflex     Status: None   Collection Time: 10/03/23  7:57 PM   Specimen: Stool  Result Value Ref Range Status   C Diff antigen NEGATIVE NEGATIVE Final   C Diff toxin NEGATIVE NEGATIVE Final   C Diff interpretation No C. difficile detected.  Final     Comment: Performed at [facility], [address], [place], [place] [address]  MRSA Next Gen by PCR, Nasal     Status: None   Collection Time: 10/03/23  8:27 PM   Specimen: Nasal Mucosa; Nasal Swab  Result Value Ref Range Status   MRSA by PCR Next Gen NOT DETECTED NOT DETECTED Final    Comment: (NOTE) The GeneXpert MRSA Assay (FDA approved for NASAL specimens only), is one component of a comprehensive MRSA colonization surveillance program. It is not intended to diagnose MRSA infection nor to guide or monitor treatment for MRSA infections. Test performance is not FDA approved in patients less than 52 years old. Performed at  [facility], [address], [place], [place] [address]      Scheduled Meds:  Chlorhexidine Gluconate Cloth  6 each Topical Q0600   Chlorhexidine Gluconate Cloth  6 each Topical Q0600   prochlorperazine  5 mg Intravenous Q6H   Continuous Infusions:  Procedures/Studies: MR ABDOMEN MRCP WO CONTRAST Result Date: 10/04/2023 CLINICAL DATA:  Cholelithiasis Metastatic carcinoid tumor. EXAM: MRI ABDOMEN WITHOUT CONTRAST  (INCLUDING MRCP) TECHNIQUE: Multiplanar multisequence MR imaging of the abdomen was performed. Heavily T2-weighted images of the biliary and pancreatic ducts were obtained, and three-dimensional MRCP images were rendered by post processing. COMPARISON:  Abdominopelvic CT 10/03/2023 and 09/20/2023 FINDINGS: Technical note: Despite efforts by the technologist and patient, mild motion artifact is present on today's exam and could not be eliminated. This reduces exam sensitivity and specificity. Lower chest:  Small left pleural effusion, unchanged from recent CT. Hepatobiliary: Widespread hepatic metastatic disease again noted, unchanged from recent CT. Largest lesion centrally in the right and left lobes measures up to 9.1 x 6.3 cm on image 14/4. These lesions demonstrate heterogeneous T1 and T2 hyperintensity. No contrast was administered. Low T2 signal  throughout the liver, likely due to hemosiderosis. Small gallstones with mild gallbladder distention. No gallbladder wall thickening or surrounding inflammation. Mild intra and extrahepatic biliary dilatation. The common hepatic duct measures up to 10 mm in diameter. The small partially calcified stones in the distal common bile duct on recent CT are not well seen on this examination due to motion artifact, although are grossly unchanged. Pancreas: Mild atrophy. No focal abnormality, surrounding inflammation or ductal dilatation. Spleen: Normal in size without focal abnormality. 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 Adrenals/Urinary Tract: The left adrenal gland appears normal. The right adrenal gland and right kidney are not demonstrated. Left renal cortical thinning with scattered cyst formation. No hydronephrosis. Stomach/Bowel: The stomach appears unremarkable for its degree of distension. No evidence of bowel wall thickening, distention or surrounding inflammatory change. Vascular/Lymphatic: There are no enlarged abdominal lymph nodes. No acute vascular findings. Other: Generalized soft tissue edema. 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 Musculoskeletal: No acute or significant osseous findings. No evidence of osseous metastatic disease. L5-S1 interbody ankylosis. IMPRESSION: 1. Widespread hepatic metastatic disease, unchanged from recent CT. 2. Cholelithiasis with mild gallbladder distention. No evidence of acute cholecystitis. 3. Mild intra and extrahepatic biliary dilatation. The small partially calcified stones in the distal common bile duct on recent CT are not well seen on this examination due to motion artifact, although are grossly unchanged. 4. 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 5. Small left pleural effusion, unchanged from recent CT. 6. Low T2 signal throughout  the liver and spleen, likely due to hemosiderosis. Electronically Signed   By: Carey Bullocks M.D.   On: 10/04/2023 20:36   CT ABDOMEN PELVIS W CONTRAST Result Date: 10/03/2023 CLINICAL DATA:  Metastatic carcinoid tumor weakness. Mechanical fall. Abdominal pain. Sepsis. * Tracking Code: BO * EXAM: CT ABDOMEN AND PELVIS WITH CONTRAST TECHNIQUE: Multidetector CT imaging of the abdomen and pelvis was performed using the standard protocol following bolus administration of intravenous contrast. RADIATION DOSE REDUCTION: This exam was performed according to the departmental dose-optimization program which includes automated exposure control, adjustment of the mA and/or kV according to patient size and/or use of iterative reconstruction technique. CONTRAST:  OMNIPAQUE IOHEXOL 300 MG/ML  SOLN COMPARISON:  09/20/2023 FINDINGS: Lower chest: Small left pleural effusion. Hepatobiliary: Increased central necrosis and  hypodensity associated with scattered metastatic lesions throughout the liver. This includes varicoid lucency in a 9.4 cm left hepatic lobe metastatic lesion. Mild intrahepatic biliary dilatation along with extrahepatic biliary dilatation extending to a cluster of spell to distal CBD filling defects each measuring about 0.4 cm in diameter shown for example on image 37 series 4 and image 29 series 2, suspicious for choledocholithiasis. Mildly distended gallbladder. Pancreas: Unremarkable Spleen: Unremarkable Adrenals/Urinary Tract: Absent right kidney. Small left kidney unchanged in appearance from the recent prior exam. Wall thickening along the urinary bladder, cystitis not excluded although some of this may be from nondistention. No hydronephrosis or hydroureter. Stomach/Bowel: Contrast medium in stool in the mildly distended rectum, with substantial indistinctly marginated rectal wall thickening. Cannot exclude stercoral colitis or proctitis. This has worsened compared to previous. No dilated bowel.  Vascular/Lymphatic: Minimal iliac artery atheromatous vascular calcification. Reproductive: Fiducials along the posterior margin of the prostate gland. Other: Omental and mesenteric nodularity compatible with tumor. This is most confluent in the periumbilical region or bilobed tumor is present within and underlying the umbilicus measuring 2.7 by 2.6 cm on image 43 series 2, stable in appearance. There is tumor with accentuated neovascularity along the right paracolic gutter just below the right hepatic lobe. Subcutaneous and mesenteric edema compatible with progressive third spacing of fluids. Musculoskeletal: Old healed left pelvic fractures. Lower lumbar spondylosis and degenerative disc disease with fusion between the L5 and S1 vertebral bodies. IMPRESSION: 1. Increased central necrosis and hypodensity associated with scattered metastatic lesions throughout the liver. 2. Mild intrahepatic and extrahepatic biliary dilatation extending to a cluster of two filling defects in the distal CBD each measuring about 0.4 cm in diameter, compatible with choledocholithiasis. 3. Omental and mesenteric nodularity compatible with tumor, similar to previous. 4. Contrast medium and stool in the mildly distended rectum, with substantial indistinctly marginated rectal wall thickening. Cannot exclude stercoral colitis or proctitis. This has worsened compared to previous. 5. Wall thickening along the urinary bladder, cystitis not excluded although some of this may be from nondistention. 6. Small left pleural effusion. 7. Subcutaneous and mesenteric edema compatible with progressive third spacing of fluids. 8. Old healed left pelvic fractures. Electronically Signed   By: Gaylyn Rong M.D.   On: 10/03/2023 18:26   DG Chest Portable 1 View Result Date: 10/03/2023 CLINICAL DATA:  Altered mental status. EXAM: PORTABLE CHEST 1 VIEW COMPARISON:  09/20/2023. FINDINGS: Bilateral lung fields are clear. Bilateral costophrenic angles  are clear. Normal cardio-mediastinal silhouette. No acute osseous abnormalities. The soft tissues are within normal limits. IMPRESSION: No active disease. Electronically Signed   By: Jules Schick M.D.   On: 10/03/2023 14:15   CT ABDOMEN PELVIS W CONTRAST Result Date: 09/20/2023 CLINICAL DATA:  Acute abdominal pain and recent missed dialysis sessions, initial encounter EXAM: CT ABDOMEN AND PELVIS WITH CONTRAST TECHNIQUE: Multidetector CT imaging of the abdomen and pelvis was performed using the standard protocol following bolus administration of intravenous contrast. RADIATION DOSE REDUCTION: This exam was performed according to the departmental dose-optimization program which includes automated exposure control, adjustment of the mA and/or kV according to patient size and/or use of iterative reconstruction technique. CONTRAST:  OMNIPAQUE IOHEXOL 300 MG/ML  SOLN COMPARISON:  08/23/2023 FINDINGS: Lower chest: No acute abnormality. Hepatobiliary: Liver again demonstrates peripherally enhancing metastatic lesions which appear roughly similar to that seen on the prior exam but better visualized due to the contrast enhancement. The gallbladder is well distended. Single dependent gallstone is seen. Cystic duct and common bile  duct are dilated no definitive choledocholithiasis is seen. Pancreas: Mild dilatation of the pancreatic duct is noted. No other focal pancreas abnormality is noted. Spleen: Normal in size without focal abnormality. Adrenals/Urinary Tract: Adrenal glands are within normal limits. Changes of prior right nephrectomy are again noted. Visualized left kidney shows normal enhancement. Cystic changes are seen. A focal hypodense lesion is again identified stable in appearance from the prior study. Bladder is decompressed. Stomach/Bowel: No obstructive or inflammatory changes of the colon are noted. The appendix is within normal limits. Mild diverticular change of the colon is noted. Stomach and small  bowel are within normal limits. Vascular/Lymphatic: Aortic atherosclerosis. 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 This measures approximately 2.9 cm in greatest dimension. Preaortic node is noted on image number 26 of series 2 similar to that noted on prior PET-CT. Prominent right iliac node is noted on image number 56 of series 2 corresponding the prior PET-CT. Similar enhancing lesion is noted adjacent to the tip of the liver also stable from the prior PET-CT. Reproductive: Prostate is unremarkable. Other: Small fluid containing right inguinal hernia is noted new from the prior study. Small umbilical hernia is identified containing enhancing tissue consistent with the known metastatic lesions. no significant free fluid is noted. Musculoskeletal: Degenerative changes of lumbar spine are noted. No acute bony abnormality is seen. 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 Metastatic lesions are noted herniating into a small umbilical hernia. Fluid containing right inguinal hernia. Status post right nephrectomy. Cholelithiasis without choledocholithiasis. Prominence of the common bile duct and cystic duct are seen. Electronically Signed   By: Alcide Clever M.D.   On: 09/20/2023 19:17   DG Chest Port 1 View Result Date: 09/20/2023 CLINICAL DATA:  Shortness of breath. EXAM: PORTABLE CHEST 1 VIEW COMPARISON:  PET-CT dated 08/08/2023 FINDINGS: Normal sized heart. Tortuous and partially calcified thoracic aorta. Clear lungs with normal vascularity. Diffuse osteopenia and mild thoracic spine degenerative changes. IMPRESSION: No acute abnormality. Electronically Signed   By: Beckie Salts M.D.   On: 09/20/2023 16:50    Catarina Hartshorn, DO  [facility]  If 7PM-7AM, please contact  night-coverage [email or web address] Password [facility] 10/05/2023, 12:58 PM   LOS: 2 days

## 2023-10-05 NOTE — TOC Progression Note (Signed)
 Transition of Care [facility]) - Progression Note    Patient Details  Name: Allen Davenport MRN: [id number] Date of Birth: 12/25/1940  Transition of Care [facility]) CM/SW Contact  Elliot Gault, LCSW Phone Number: 10/05/2023, 1:22 PM  Clinical Narrative:     TOC following. Pt's family has elected for comfort care and residential hospice referral. Referred to [facility] as requested. [facility] team states that they do not have an RN to do the assessment today but they may possibly be able to complete it over the weekend.   Updated MD. Jory Sims will follow.  Expected Discharge Plan: Home/Self Care Barriers to Discharge: Continued Medical Work up  Expected Discharge Plan and Services In-house Referral: Clinical Social Work     Living arrangements for the past 2 months: Single Family Home                                       Social Determinants of Health (SDOH) Interventions SDOH Screenings   Food Insecurity: No Food Insecurity (10/03/2023)  Housing: Low Risk  (10/03/2023)  Transportation Needs: No Transportation Needs (10/03/2023)  Utilities: Not At Risk (10/03/2023)  Alcohol Screen: Low Risk  (05/24/2020)  Depression (PHQ2-9): Low Risk  (06/04/2019)  Financial Resource Strain: Low Risk  (05/24/2020)  Physical Activity: Insufficiently Active (05/24/2020)  Social Connections: Unknown (10/03/2023)  Stress: No Stress Concern Present (05/24/2020)  Tobacco Use: Low Risk  (10/03/2023)    Readmission Risk Interventions    10/04/2023   12:43 PM 10/03/2023    7:27 PM  Readmission Risk Prevention Plan  Transportation Screening  Complete  PCP or Specialist Appt within 5-7 Days  Complete  Home Care Screening  Complete  Medication Review (RN CM)  Complete  HRI or Home Care Consult Complete   Social Work Consult for Recovery Care Planning/Counseling Complete   Palliative Care Screening Complete   Medication Review Oceanographer) Complete

## 2023-10-05 NOTE — Consult Note (Addendum)
 Agree with the below findings as written. The patient was presented to me by the APP (Advanced Practice Provider) and I have also seen and examined the patient independently. Please see my key portion of the encounter as documented.   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  GI was consulted with blood per rectum, worsening anemia and MRCP with choledocholithiasis   Patient was seen with wife in the room in ICU this afternoon.  Had extensive discussion with the wife regarding patient condition and deconditioning.   Labs with hemoglobin downtrending to 8 previously 11.9 BUN 26 creatinine 7.43   MRCP with choledocholithiasis and CT abdomen pelvis with mild intra and extrahepatic bili dilation   Discussed with the wife causes of blood per rectum either brisk upper GI bleed with peptic ulcer disease or angiectasia or lower GI bleed with angiodysplasia, malignancy or diverticular bleed.  Without endoscopic evaluation unable to rule out a above.  Also even though patient lab does not appear that he has any ascending cholangitis , given choledocholithiasis confirmed by MRCP, ERCP would serve to remove stones.  After discussion risk-benefit and limitation wife decided on conservative management.  Later goals of care has been transitioned to comfort care.    Vista Lawman, MD Gastroenterology and Hepatology [facility] Gastroenterology       Gastroenterology Consult   Referring Provider: No ref. provider found Primary Care Physician:  Toma Deiters, MD Primary Gastroenterologist:  unassigned  Patient ID: Randon Somera Markovitz; [id number]; October 14, 1940   Admit date: 10/03/2023  LOS: 2 days   Date of Consultation: 10/05/2023  Reason for Consultation:  choledocholithiasis and rectal bleeding  History of Present Illness   [place]  Ryer is a 83 y.o. year old male with history of ESRD on dialysis (TTS), malignant carcinoid tumor with metastatic disease to the liver and peritoneum who presented for lack of appetite, weakness post mechanical fall at home.  GI consulted given concerns for rectal bleeding with worsening anemia as well as choledocholithiasis on imaging.   ED Course: CT A/P 10/03/23 - Increased central necrosis and hypodensity associated with scattered metastatic lesions throughout the liver. Mild intrahepatic and extrahepatic biliary dilatation extending to a cluster of two filling defects in the distal CBD each measuring about 0.4 cm in diameter, compatible with choledocholithiasis. Omental and mesenteric nodularity compatible with tumor, similar to previous. Contrast medium and stool in the mildly distended rectum, with substantial indistinctly marginated rectal wall thickening. Cannot exclude stercoral colitis or proctitis. This has worsened compared to previous. Wall thickening along the urinary bladder, cystitis not excluded although some of this may be from nondistention. Small left pleural effusion. Subcutaneous and mesenteric edema compatible with progressive third spacing of fluids.Old healed left pelvic fractures. Labs -creatinine 7.43, cortisol 20.9, hemoglobin 9.7 (previously 11.9 on 3/19), platelets 140, albumin 2.6, T. bili 1.5, indirect 1.3, hep B surface antigen and surface antibody negative   Today:  Patient sleeping.  Most HPI obtained from wife.  Patient is tired and fatigued. Wife reports bleeding has been going on for a little while and has been darker in nature. He has struggled with appetite.  Has lost about 100 pounds in the last 6 months.  Has not been tolerating dialysis very well in the last several months because of his blood pressures.  Had a fall on 3/19.  Has been feeling  very weak.  Having 2-3 loose bowel movements daily.  Having trouble taking medications at home.  MRI/MRCP 10/04/23  IMPRESSION: Widespread hepatic metastatic disease, unchanged from recent CT. Cholelithiasis with mild gallbladder distention. No evidence of acute cholecystitis. Mild intra and extrahepatic biliary dilatation. The small partially calcified stones in the distal common bile duct on recent CT are not well seen on this examination due to motion artifact, although are grossly unchanged. 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 Small left pleural effusion, unchanged from recent CT. 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     Past Medical History:  Diagnosis Date   Anemia    Cancer (HCC)    right renal . Prostate- Radiation treatment   CHF (congestive heart failure) (HCC)    Chronic kidney disease    Dialysis T/TH/Sa   Edema    Heart murmur    "nothing to worry about"   Hyperlipidemia    Hypertension    Malignant carcinoid tumor (HCC) 01/03/2016   Pneumonia    as a child    Past Surgical History:  Procedure Laterality Date   AV FISTULA PLACEMENT Left 10/20/2014   Procedure: Left Arm ARTERIOVENOUS (AV) FISTULA CREATION;  Surgeon: Sherren Kerns, MD;  Location: [facility] OR;  Service: Vascular;  Laterality: Left;   NEPHRECTOMY Right 2010   PERIPHERAL VASCULAR BALLOON ANGIOPLASTY  06/20/2019   Procedure: PERIPHERAL VASCULAR BALLOON ANGIOPLASTY;  Surgeon: Sherren Kerns, MD;  Location: [facility] INVASIVE CV LAB;  Service: Cardiovascular;;   REVISON OF ARTERIOVENOUS FISTULA Left 08/04/2019   Procedure: REVISON OF ARTERIOVENOUS FISTULA WITH SIDE BRANCH LIGATION;  Surgeon: Sherren Kerns, MD;  Location: [facility] OR;  Service: Vascular;  Laterality: Left;    Prior to Admission medications   Medication Sig Start Date End Date Taking? Authorizing Provider  amLODipine (NORVASC) 2.5 MG tablet Take 2.5 mg by mouth daily. 09/03/23  Yes [provider]  Calcium Carb-Cholecalciferol (CALCIUM 600 + D PO) Take  1 tablet by mouth daily.   Yes [provider]  diphenoxylate-atropine (LOMOTIL) 2.5-0.025 MG tablet Take 2 tablets by mouth 4 (four) times daily as needed for diarrhea or loose stools. 08/14/23  Yes Doreatha Massed, MD  fexofenadine (ALLEGRA) 180 MG tablet Take 180 mg by mouth daily. 03/02/22  Yes [provider]  furosemide (LASIX) 40 MG tablet Take 40 mg by mouth every morning. 06/25/19  Yes [provider]  mirtazapine (REMERON) 7.5 MG tablet Take 7.5 mg by mouth at bedtime. 03/07/22  Yes [provider]  ondansetron (ZOFRAN) 4 MG tablet Take 4 mg by mouth every 8 (eight) hours as needed. 09/19/23  Yes [provider]  Riboflavin (B2) 100 MG TABS Take 100 mg by mouth daily.   Yes [provider]  sodium bicarbonate 650 MG tablet Take 1 tablet by mouth 3 (three) times daily.   Yes [provider]  TUBERCULIN SYR 1CC/27GX1/2" (B-D TB SYRINGE 1CC/27GX1/2") 27G X 1/2" 1 ML MISC Use to administer pegasys every 14 days 06/22/22   Doreatha Massed, MD    Current Facility-Administered Medications  Medication Dose Route Frequency Provider Last Rate Last Admin   acetaminophen (TYLENOL) tablet 650 mg  650 mg Oral Q6H PRN Tat, David, MD       Or   acetaminophen (TYLENOL) suppository 650 mg  650 mg Rectal Q6H PRN Tat, Onalee Hua, MD       ceFEPIme (MAXIPIME) 2 g in sodium chloride  0.9 % 100 mL IVPB  2 g Intravenous Q Carey Bullocks, MD   Stopped at 10/04/23 1736   Chlorhexidine Gluconate Cloth 2 % PADS 6 each  6 each Topical Q0600 Tat, Onalee Hua, MD   6 each at 10/04/23 1002   Chlorhexidine Gluconate Cloth 2 % PADS 6 each  6 each Topical Q0600 Anthony Sar, MD   6 each at 10/05/23 0927   heparin injection 5,000 Units  5,000 Units Subcutaneous Andres Labrum, MD   5,000 Units at 10/04/23 0530   lidocaine (PF) (XYLOCAINE) 1 % injection 5 mL  5 mL Intradermal PRN Anthony Sar, MD       lidocaine-prilocaine (EMLA) cream 1 Application  1  Application Topical PRN Anthony Sar, MD       midodrine (PROAMATINE) tablet 5 mg  5 mg Oral TID WC Anthony Sar, MD   5 mg at 10/05/23 0735   mirtazapine (REMERON) tablet 7.5 mg  7.5 mg Oral Maura Crandall, MD   7.5 mg at 10/04/23 2238   pantoprazole (PROTONIX) injection 40 mg  40 mg Intravenous Pablo Ledger, MD   40 mg at 10/05/23 6295   pentafluoroprop-tetrafluoroeth (GEBAUERS) aerosol 1 Application  1 Application Topical PRN Anthony Sar, MD       prochlorperazine (COMPAZINE) injection 5 mg  5 mg Intravenous Rob Bunting, MD   5 mg at 10/05/23 2841   sodium bicarbonate tablet 650 mg  650 mg Oral TID Catarina Hartshorn, MD   650 mg at 10/05/23 3244   vancomycin (VANCOREADY) IVPB 750 mg/150 mL  750 mg Intravenous Avis Epley, MD   Stopped at 10/04/23 2344    Allergies as of 10/03/2023   (No Known Allergies)    Family History  Problem Relation Age of Onset   Cancer Mother    Hypertension Father    Cancer Sister     Social History   Socioeconomic History   Marital status: Married    Spouse name: Not on file   Number of children: Not on file   Years of education: Not on file   Highest education level: Not on file  Occupational History   Not on file  Tobacco Use   Smoking status: Never   Smokeless tobacco: Never  Vaping Use   Vaping status: Never Used  Substance and Sexual Activity   Alcohol use: No    Alcohol/week: 0.0 standard drinks of alcohol   Drug use: No   Sexual activity: Not on file  Other Topics Concern   Not on file  Social History Narrative   Not on file   Social Drivers of Health   Financial Resource Strain: Low Risk  (05/24/2020)   Overall Financial Resource Strain (CARDIA)    Difficulty of Paying Living Expenses: Not hard at all  Food Insecurity: No Food Insecurity (10/03/2023)   Hunger Vital Sign    Worried About Running Out of Food in the Last Year: Never true    Ran Out of Food in the Last Year: Never true  Transportation Needs: No  Transportation Needs (10/03/2023)   PRAPARE - Administrator, Civil Service (Medical): No    Lack of Transportation (Non-Medical): No  Physical Activity: Insufficiently Active (05/24/2020)   Exercise Vital Sign    Days of Exercise per Week: 7 days    Minutes of Exercise per Session: 10 min  Stress: No Stress Concern Present (05/24/2020)   [employer] of Occupational Health - Occupational Stress  Questionnaire    Feeling of Stress : Not at all  Social Connections: Unknown (10/03/2023)   Social Connection and Isolation Panel [NHANES]    Frequency of Communication with Friends and Family: Twice a week    Frequency of Social Gatherings with Friends and Family: Not on file    Attends Religious Services: More than 4 times per year    Active Member of [employer] or Organizations: Yes    Attends Banker Meetings: 1 to 4 times per year    Marital Status: Married  Catering manager Violence: Not At Risk (10/03/2023)   Humiliation, Afraid, Rape, and Kick questionnaire    Fear of Current or Ex-Partner: No    Emotionally Abused: No    Physically Abused: No    Sexually Abused: No     Review of Systems   Unable to obtain review of symptoms from patient.  Physical Exam   Vital Signs in last 24 hours: Temp:  [97.4 F (36.3 C)-97.9 F (36.6 C)] 97.4 F (36.3 C) (03/21 0800) Pulse Rate:  [72-111] 81 (03/21 0700) Resp:  [12-32] 20 (03/21 0700) BP: (84-155)/(41-99) 115/59 (03/21 0700) SpO2:  [87 %-100 %] 100 % (03/21 0700) Weight:  [59.9 kg-60.2 kg] 59.9 kg (03/20 2215) Last BM Date : 10/05/23  General:   Drowsy chronically ill-appearing, cachectic. Head:  Normocephalic and atraumatic. Eyes:  Sclera clear, no icterus.   Conjunctiva pink. Ears:  Normal auditory acuity. Mouth: Poor dentition. Neck:  Supple; no masses.   Rectal: Deferred Msk:  Symmetrical without gross deformities. Normal posture. Extremities:  Without clubbing or edema. Neurologic: Drowsy. Psych:  Drowsy  Intake/Output from previous day: 03/20 0701 - 03/21 0700 In: 1645.3 [P.O.:480; I.V.:867.2; IV Piggyback:298.1] Out: 300  Intake/Output this shift: No intake/output data recorded.   Labs/Studies   Recent Labs Recent Labs    10/03/23 1216 10/04/23 0509 10/05/23 0513  WBC 7.8 8.5 8.0  HGB 11.9* 9.7* 8.7*  HCT 38.5* 31.2* 27.2*  PLT 220 140* 97*   BMET Recent Labs    10/03/23 1216 10/04/23 0505 10/05/23 0513  NA 139 139 137  K 3.9 3.8 3.7  CL 94* 99 98  CO2 20* 24 26  GLUCOSE 108* 79 79  BUN 36* 43* 29*  CREATININE 7.05* 7.43* 4.77*  CALCIUM 9.1 8.2* 7.6*   LFT Recent Labs    10/03/23 1216 10/04/23 0505 10/04/23 0509 10/05/23 0513  PROT 6.4*  --  5.1* 4.9*  ALBUMIN 3.2* 2.6* 2.6* 2.6*  AST 43*  --  59* 38  ALT 12  --  12 10  ALKPHOS 76  --  58 53  BILITOT 2.0*  --  1.5* 1.4*  BILIDIR  --   --  0.2  --   IBILI  --   --  1.3*  --    PT/INR No results for input(s): "LABPROT", "INR" in the last 72 hours. Hepatitis Panel Recent Labs    10/04/23 0910  HEPBSAG NON REACTIVE   C-Diff Recent Labs    10/03/23 1957  CDIFFTOX NEGATIVE    Radiology/Studies MR ABDOMEN MRCP WO CONTRAST Result Date: 10/04/2023 CLINICAL DATA:  Cholelithiasis Metastatic carcinoid tumor. EXAM: MRI ABDOMEN WITHOUT CONTRAST  (INCLUDING MRCP) TECHNIQUE: Multiplanar multisequence MR imaging of the abdomen was performed. Heavily T2-weighted images of the biliary and pancreatic ducts were obtained, and three-dimensional MRCP images were rendered by post processing. COMPARISON:  Abdominopelvic CT 10/03/2023 and 09/20/2023 FINDINGS: Technical note: Despite efforts by the technologist and patient, mild motion artifact  is present on today's exam and could not be eliminated. This reduces exam sensitivity and specificity. Lower chest:  Small left pleural effusion, unchanged from recent CT. Hepatobiliary: Widespread hepatic metastatic disease again noted, unchanged from recent CT. Largest  lesion centrally in the right and left lobes measures up to 9.1 x 6.3 cm on image 14/4. These lesions demonstrate heterogeneous T1 and T2 hyperintensity. No contrast was administered. 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 Small gallstones with mild gallbladder distention. No gallbladder wall thickening or surrounding inflammation. Mild intra and extrahepatic biliary dilatation. The common hepatic duct measures up to 10 mm in diameter. The small partially calcified stones in the distal common bile duct on recent CT are not well seen on this examination due to motion artifact, although are grossly unchanged. Pancreas: Mild atrophy. No focal abnormality, surrounding inflammation or ductal dilatation. Spleen: Normal in size without focal abnormality. 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 Adrenals/Urinary Tract: The left adrenal gland appears normal. The right adrenal gland and right kidney are not demonstrated. Left renal cortical thinning with scattered cyst formation. No hydronephrosis. Stomach/Bowel: The stomach appears unremarkable for its degree of distension. No evidence of bowel wall thickening, distention or surrounding inflammatory change. Vascular/Lymphatic: There are no enlarged abdominal lymph nodes. No acute vascular findings. Other: Generalized soft tissue edema. 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 Musculoskeletal: No acute or significant osseous findings. No evidence of osseous metastatic disease. L5-S1 interbody ankylosis. IMPRESSION: 1. Widespread hepatic metastatic disease, unchanged from recent CT. 2. Cholelithiasis with mild gallbladder distention. No evidence of acute cholecystitis. 3. Mild intra and extrahepatic biliary dilatation. The small partially calcified stones in the distal common bile duct on recent CT are not well seen on this examination due to motion artifact, although are grossly  unchanged. 4. 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 5. Small left pleural effusion, unchanged from recent CT. 6. 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 Electronically Signed   By: Carey Bullocks M.D.   On: 10/04/2023 20:36   CT ABDOMEN PELVIS W CONTRAST Result Date: 10/03/2023 CLINICAL DATA:  Metastatic carcinoid tumor weakness. Mechanical fall. Abdominal pain. Sepsis. * Tracking Code: BO * EXAM: CT ABDOMEN AND PELVIS WITH CONTRAST TECHNIQUE: Multidetector CT imaging of the abdomen and pelvis was performed using the standard protocol following bolus administration of intravenous contrast. RADIATION DOSE REDUCTION: This exam was performed according to the departmental dose-optimization program which includes automated exposure control, adjustment of the mA and/or kV according to patient size and/or use of iterative reconstruction technique. CONTRAST:  OMNIPAQUE IOHEXOL 300 MG/ML  SOLN COMPARISON:  09/20/2023 FINDINGS: Lower chest: Small left pleural effusion. Hepatobiliary: Increased central necrosis and hypodensity associated with scattered metastatic lesions throughout the liver. This includes varicoid lucency in a 9.4 cm left hepatic lobe metastatic lesion. Mild intrahepatic biliary dilatation along with extrahepatic biliary dilatation extending to a cluster of spell to distal CBD filling defects each measuring about 0.4 cm in diameter shown for example on image 37 series 4 and image 29 series 2, suspicious for choledocholithiasis. Mildly distended gallbladder. Pancreas: Unremarkable Spleen: Unremarkable Adrenals/Urinary Tract: Absent right kidney. Small left kidney unchanged in appearance from the recent prior exam. Wall thickening along the urinary bladder, cystitis not excluded although some of this may be from nondistention. No hydronephrosis or hydroureter. Stomach/Bowel: Contrast medium in stool in  the mildly distended rectum, with substantial  indistinctly marginated rectal wall thickening. Cannot exclude stercoral colitis or proctitis. This has worsened compared to previous. No dilated bowel. Vascular/Lymphatic: Minimal iliac artery atheromatous vascular calcification. Reproductive: Fiducials along the posterior margin of the prostate gland. Other: Omental and mesenteric nodularity compatible with tumor. This is most confluent in the periumbilical region or bilobed tumor is present within and underlying the umbilicus measuring 2.7 by 2.6 cm on image 43 series 2, stable in appearance. There is tumor with accentuated neovascularity along the right paracolic gutter just below the right hepatic lobe. Subcutaneous and mesenteric edema compatible with progressive third spacing of fluids. Musculoskeletal: Old healed left pelvic fractures. Lower lumbar spondylosis and degenerative disc disease with fusion between the L5 and S1 vertebral bodies. IMPRESSION: 1. Increased central necrosis and hypodensity associated with scattered metastatic lesions throughout the liver. 2. Mild intrahepatic and extrahepatic biliary dilatation extending to a cluster of two filling defects in the distal CBD each measuring about 0.4 cm in diameter, compatible with choledocholithiasis. 3. Omental and mesenteric nodularity compatible with tumor, similar to previous. 4. Contrast medium and stool in the mildly distended rectum, with substantial indistinctly marginated rectal wall thickening. Cannot exclude stercoral colitis or proctitis. This has worsened compared to previous. 5. Wall thickening along the urinary bladder, cystitis not excluded although some of this may be from nondistention. 6. Small left pleural effusion. 7. Subcutaneous and mesenteric edema compatible with progressive third spacing of fluids. 8. Old healed left pelvic fractures. Electronically Signed   By: Gaylyn Rong M.D.   On: 10/03/2023 18:26   DG Chest Portable  1 View Result Date: 10/03/2023 CLINICAL DATA:  Altered mental status. EXAM: PORTABLE CHEST 1 VIEW COMPARISON:  09/20/2023. FINDINGS: Bilateral lung fields are clear. Bilateral costophrenic angles are clear. Normal cardio-mediastinal silhouette. No acute osseous abnormalities. The soft tissues are within normal limits. IMPRESSION: No active disease. Electronically Signed   By: Jules Schick M.D.   On: 10/03/2023 14:15   Assessment   Trevonne Nyland Griffey is a 83 y.o. year old male with history of ESRD on dialysis (TTS), malignant carcinoid tumor with metastatic disease to the liver and peritoneum who presented for lack of appetite, weakness post mechanical fall at home.  GI consulted given concerns for rectal bleeding with worsening anemia as well as choledocholithiasis on imaging.  Choledocholithiasis: - CT and MRI with evidence of choledocholithiasis -mildly elevated T bili, lfts otherwise normal -no fevers  Rectal bleeding, anemia:  - Maroon-colored stool -Metastatic disease and likely worsening anemia in the setting of poorly tolerated dialysis, ESRD, and rectal bleeding.  -rectal bleeding likely secondary to carcinoid.  - not good candidate for procedures.   Plan / Recommendations   Upon my arrival to room hospitalist and family had goals of care discussion and patient and family have decided to transition to comfort care. He is DNR/DNI.     10/05/2023, 9:54 AM  Brooke Bonito, MSN, FNP-BC, AGACNP-BC [facility]

## 2023-10-05 NOTE — Progress Notes (Addendum)
 [facility] Progress Note    Assessment/ Plan:   ESRD  -outpatient HD orders: [facility], TTS.  3 hours 45 minutes.  EDW 63 kg.  aVF, 15-gauge.  Flow rates: 400/500.  36 degrees.  3K/2.5 calcium.  Heparin: 1000 units loading, 500 units/h.  Meds: Mircera 30 mcg every 4 weeks (due the week of 10/01/23), Venofer 50 mg q. weekly. -has been missing HD frequently and UF has been difficult due to hypotension, still the case as an inpatient -HD on TTS schedule, tomorrow. No heparin. Continuing until GOC finalized -Overall, not tolerating dialysis it seems especially from an outpatient perspective.  Recommend continuing with palliative discussions in regards to GOC. At this junction, would recommend comfort care   Generalized weakness -per primary   Metastatic tumor to peritoneum and liver -not a candidate for further therapy apparently. Recommend continued discussion with palliative. Appreciate assistance. 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 Images personally reviewed.   Intractable vomiting -per primary   Hypothermia -on empiric abx per primary   Volume/ hypotension H/o HTN -started midodrine, avoid anti-HTNs for now. Can increase midodrine to 10mg  if needed -UF as tolerated   Anemia of Chronic Kidney Disease -possible GIB? Avoid ESA given malignancy with radiographic progression.  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 Possibly related to tumor burden/liver disease? Lactate down to 1.4 -Bicarb support with HD  Advance care planning -currently DNR -see HPI and above in regards to discussion  Discussed with primary service.  Patient won't be physically seen over the weekend but chart will be monitored remotely. Please call on-call/covering nephrologist with any questions/concerns or if patient needs to be seen physically.    Subjective:   Patient seen and examined bedside.  Hypertension with HD yesterday with net UF of only 0.3 L despite midodrine, albumin, cooling of dialysate. Patient denies any complaints. Had a frank and lengthy discussions with him in regards to his overall condition and the fact that he has not been tolerating dialysis. He is aware of his overall condition, discussed his overall poor prognosis as well. Recommended that the next step would be comfort care/hospice and he is receptive to this.   Objective:   BP (!) 115/59   Pulse 81   Temp (!) 97.4 F (36.3 C) (Oral)   Resp 20   Ht 6\' 5"  (1.956 m)   Wt 59.9 kg   SpO2 100%   BMI 15.66 kg/m   Intake/Output Summary (Last 24 hours) at 10/05/2023 0930 Last data filed at 10/05/2023 0106 Gross per 24 hour  Intake 858.08 ml  Output 300 ml  Net 558.08 ml   Weight change: -4.5 kg  Physical Exam: Gen: ill appearing, cachectic HEENT: +temporal wasting CVS: RRR Resp: normal wob Abd: soft, NT Ext: +pedal edema Neuro: globally weak, awake/alert  Imaging: MR ABDOMEN MRCP WO CONTRAST Result Date: 10/04/2023 CLINICAL DATA:  Cholelithiasis Metastatic carcinoid tumor. EXAM: MRI ABDOMEN WITHOUT CONTRAST  (INCLUDING  MRCP) TECHNIQUE: Multiplanar multisequence MR imaging of the abdomen was performed. Heavily T2-weighted images of the biliary and pancreatic ducts were obtained, and three-dimensional MRCP images were rendered by post processing. COMPARISON:   Abdominopelvic CT 10/03/2023 and 09/20/2023 FINDINGS: Technical note: Despite efforts by the technologist and patient, mild motion artifact is present on today's exam and could not be eliminated. This reduces exam sensitivity and specificity. Lower chest:  Small left pleural effusion, unchanged from recent CT. Hepatobiliary: Widespread hepatic metastatic disease again noted, unchanged from recent CT. Largest lesion centrally in the right and left lobes measures up to 9.1 x 6.3 cm on image 14/4. These lesions demonstrate heterogeneous T1 and T2 hyperintensity. No contrast was administered. 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 Small gallstones with mild gallbladder distention. No gallbladder wall thickening or surrounding inflammation. Mild intra and extrahepatic biliary dilatation. The common hepatic duct measures up to 10 mm in diameter. The small partially calcified stones in the distal common bile duct on recent CT are not well seen on this examination due to motion artifact, although are grossly unchanged. Pancreas: Mild atrophy. No focal abnormality, surrounding inflammation or ductal dilatation. Spleen: Normal in size without focal abnormality. 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 Adrenals/Urinary Tract: The left adrenal gland appears normal. The right adrenal gland and right kidney are not demonstrated. Left renal cortical thinning with scattered cyst formation. No hydronephrosis. Stomach/Bowel: The stomach appears unremarkable for its degree of distension. No evidence of bowel wall thickening, distention or surrounding inflammatory change. Vascular/Lymphatic: There are no enlarged abdominal lymph nodes. No acute vascular findings. Other: Generalized soft tissue edema. 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 Musculoskeletal: No acute or significant osseous findings. No evidence of osseous metastatic disease.  L5-S1 interbody ankylosis. IMPRESSION: 1. Widespread hepatic metastatic disease, unchanged from recent CT. 2. Cholelithiasis with mild gallbladder distention. No evidence of acute cholecystitis. 3. Mild intra and extrahepatic biliary dilatation. The small partially calcified stones in the distal common bile duct on recent CT are not well seen on this examination due to motion artifact, although are grossly unchanged. 4. 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 5. Small left pleural effusion, unchanged from recent CT. 6. 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 Electronically Signed   By: Carey Bullocks M.D.   On: 10/04/2023 20:36   CT ABDOMEN PELVIS W CONTRAST Result Date: 10/03/2023 CLINICAL DATA:  Metastatic carcinoid tumor weakness. Mechanical fall. Abdominal pain. Sepsis. * Tracking Code: BO * EXAM: CT ABDOMEN AND PELVIS WITH CONTRAST TECHNIQUE: Multidetector CT imaging of the abdomen and pelvis was performed using the standard protocol following bolus administration of intravenous contrast. RADIATION DOSE REDUCTION: This exam was performed according to the departmental dose-optimization program which includes automated exposure control, adjustment of the mA and/or kV according to patient size and/or use of iterative reconstruction technique. CONTRAST:  OMNIPAQUE IOHEXOL 300 MG/ML  SOLN COMPARISON:  09/20/2023 FINDINGS: Lower chest: Small left pleural effusion. Hepatobiliary: Increased central necrosis and hypodensity associated with scattered metastatic lesions throughout the liver. This includes varicoid lucency in a 9.4 cm left hepatic lobe metastatic lesion. Mild intrahepatic biliary dilatation along with extrahepatic biliary dilatation extending to a cluster of spell to distal CBD filling defects each measuring about 0.4 cm in diameter shown for example on image 37 series 4 and image 29 series 2,  suspicious for choledocholithiasis. Mildly distended gallbladder. Pancreas: Unremarkable  Spleen: Unremarkable Adrenals/Urinary Tract: Absent right kidney. Small left kidney unchanged in appearance from the recent prior exam. Wall thickening along the urinary bladder, cystitis not excluded although some of this may be from nondistention. No hydronephrosis or hydroureter. Stomach/Bowel: Contrast medium in stool in the mildly distended rectum, with substantial indistinctly marginated rectal wall thickening. Cannot exclude stercoral colitis or proctitis. This has worsened compared to previous. No dilated bowel. Vascular/Lymphatic: Minimal iliac artery atheromatous vascular calcification. Reproductive: Fiducials along the posterior margin of the prostate gland. Other: Omental and mesenteric nodularity compatible with tumor. This is most confluent in the periumbilical region or bilobed tumor is present within and underlying the umbilicus measuring 2.7 by 2.6 cm on image 43 series 2, stable in appearance. There is tumor with accentuated neovascularity along the right paracolic gutter just below the right hepatic lobe. Subcutaneous and mesenteric edema compatible with progressive third spacing of fluids. Musculoskeletal: Old healed left pelvic fractures. Lower lumbar spondylosis and degenerative disc disease with fusion between the L5 and S1 vertebral bodies. IMPRESSION: 1. Increased central necrosis and hypodensity associated with scattered metastatic lesions throughout the liver. 2. Mild intrahepatic and extrahepatic biliary dilatation extending to a cluster of two filling defects in the distal CBD each measuring about 0.4 cm in diameter, compatible with choledocholithiasis. 3. Omental and mesenteric nodularity compatible with tumor, similar to previous. 4. Contrast medium and stool in the mildly distended rectum, with substantial indistinctly marginated rectal wall thickening. Cannot exclude stercoral colitis or  proctitis. This has worsened compared to previous. 5. Wall thickening along the urinary bladder, cystitis not excluded although some of this may be from nondistention. 6. Small left pleural effusion. 7. Subcutaneous and mesenteric edema compatible with progressive third spacing of fluids. 8. Old healed left pelvic fractures. Electronically Signed   By: Gaylyn Rong M.D.   On: 10/03/2023 18:26   DG Chest Portable 1 View Result Date: 10/03/2023 CLINICAL DATA:  Altered mental status. EXAM: PORTABLE CHEST 1 VIEW COMPARISON:  09/20/2023. FINDINGS: Bilateral lung fields are clear. Bilateral costophrenic angles are clear. Normal cardio-mediastinal silhouette. No acute osseous abnormalities. The soft tissues are within normal limits. IMPRESSION: No active disease. Electronically Signed   By: Jules Schick M.D.   On: 10/03/2023 14:15    Labs: BMET Recent Labs  Lab 10/03/23 1216 10/04/23 0505 10/05/23 0513  NA 139 139 137  K 3.9 3.8 3.7  CL 94* 99 98  CO2 20* 24 26  GLUCOSE 108* 79 79  BUN 36* 43* 29*  CREATININE 7.05* 7.43* 4.77*  CALCIUM 9.1 8.2* 7.6*  PHOS  --  4.0  --    CBC Recent Labs  Lab 10/03/23 1216 10/04/23 0509 10/05/23 0513  WBC 7.8 8.5 8.0  NEUTROABS 7.3  --   --   HGB 11.9* 9.7* 8.7*  HCT 38.5* 31.2* 27.2*  MCV 97.0 95.1 93.2  PLT 220 140* 97*    Medications:     Chlorhexidine Gluconate Cloth  6 each Topical Q0600   Chlorhexidine Gluconate Cloth  6 each Topical Q0600   heparin  5,000 Units Subcutaneous Q8H   midodrine  5 mg Oral TID WC   mirtazapine  7.5 mg Oral QHS   pantoprazole (PROTONIX) IV  40 mg Intravenous Q12H   prochlorperazine  5 mg Intravenous Q6H   sodium bicarbonate  650 mg Oral TID      Anthony Sar, MD [facility] 10/05/2023, 9:30 AM

## 2023-10-05 NOTE — Plan of Care (Signed)

## 2023-10-06 DIAGNOSIS — C7A098 Malignant carcinoid tumors of other sites: Secondary | ICD-10-CM

## 2023-10-06 DIAGNOSIS — I9589 Other hypotension: Secondary | ICD-10-CM | POA: Diagnosis not present

## 2023-10-06 DIAGNOSIS — N186 End stage renal disease: Secondary | ICD-10-CM | POA: Diagnosis not present

## 2023-10-06 LAB — GASTROINTESTINAL PANEL BY PCR, STOOL (REPLACES STOOL CULTURE)

## 2023-10-06 MED ORDER — PROCHLORPERAZINE MALEATE 5 MG PO TABS
5.0000 mg | ORAL_TABLET | Freq: Four times a day (QID) | ORAL | Status: DC
Start: 2023-10-06 — End: 2023-10-08
  Administered 2023-10-06 – 2023-10-08 (×4): 5 mg via ORAL
  Filled 2023-10-06 (×7): qty 1

## 2023-10-06 NOTE — Plan of Care (Signed)
  Problem: Education: Goal: Knowledge of General Education information will improve Description: Including pain rating scale, medication(s)/side effects and non-pharmacologic comfort measures Outcome: Progressing   Problem: Health Behavior/Discharge Planning: Goal: Ability to manage health-related needs will improve Outcome: Progressing   Problem: Clinical Measurements: Goal: Will remain free from infection Outcome: Progressing Goal: Respiratory complications will improve Outcome: Progressing Goal: Cardiovascular complication will be avoided Outcome: Progressing   Problem: Coping: Goal: Level of anxiety will decrease Outcome: Progressing   Problem: Elimination: Goal: Will not experience complications related to bowel motility Outcome: Progressing Goal: Will not experience complications related to urinary retention Outcome: Progressing   Problem: Pain Managment: Goal: General experience of comfort will improve and/or be controlled Outcome: Progressing   Problem: Safety: Goal: Ability to remain free from injury will improve Outcome: Progressing   Problem: Skin Integrity: Goal: Risk for impaired skin integrity will decrease Outcome: Progressing

## 2023-10-06 NOTE — Progress Notes (Signed)
 Pt's only current IV is infiltrated. Several attempts have been made by multiple nurses to obtain another IV. No success. 1100  5 mg. compazine IV dose held, MD notified. Pt can take oral meds.

## 2023-10-06 NOTE — Progress Notes (Signed)
 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  The patient spouse is at the bedside to supplement the history.  She states that the patient has had a gradual functional decline over the past 3 to 4 months.  She states that he has lost 100 pounds in the past 6 months.  The patient frequently misses dialysis because of generalized weakness and just not wanting to go.  His last dialysis session was on 09/29/2023.  She states that the patient only had 1 dialysis session all last week prior to this admission.  She states that his dialysis sessions are sometimes cut off short because of his low blood pressure. Apparently the patient lost his balance and fell onto the floor on the morning of 10/03/2023.  He had difficulty getting up.  As result, the patient was brought to emergency department for further evaluation.  The patient himself denies any chest pain, shortness breath, abdominal pain, headache, neck pain.  He denies any fevers or chills.  He states that he has chronic loose stools.  On the average, the patient has 2-3 loose bowel movements on a daily basis.  He states that he has been having dry heaves on a daily basis at least for the past 2 to 3 months.  His appetite has been poor.  He denies any new medications.  He is having difficulty keeping his medications down at home. The patient states that he occasionally has some hematochezia with his bowel movements.  He denies any melena or hematemesis. In the ED, the patient was hypothermic initially with a temperature of 94.7 F.  He was hypotensive with SBP in the 70s initially.  The patient was given 1 L of fluids with some improvement.  WBC 7.8, hemoglobin 9.9, platelets 220.  Sodium 139, potassium 3.9,  bicarbonate 20, serum creatinine 7.05.  AST 43, ALT 12, alk phosphatase 26, total bilirubin 2.0. Chest x-ray was negative for any infiltrates.  Lactic acid 7.5.  EKG showed sinus tachycardia. The patient was fluid resuscitated and started on midodrine with some improvement in his blood pressure.  However, the patient still had difficulty tolerating dialysis secondary to hypotension. The case was discussed with nephrology who felt that the patient is a poor candidate for further dialysis in the setting of his metastatic cancer and poor tolerance of dialysis at this point. During the hospitalization, CT of the abdomen and pelvis continue to showed peritoneal carcinomatosis and it also showed choledocholithiasis.  MRCP was obtained and also suggested choledocholithiasis. The patient developed rectal bleeding during hospitalization.  GI was consulted.  Given the patient's poor baseline functioning, frailty, metastatic cancer, and seriousness of the acute medical conditions goals of care discussions were held with the patient's spouse and patient.  Ultimately, it was determined in the patient's best interest to change his focus of care to focus on comfort measures.    Assessment/Plan:  Generalized weakness -Multifactorial including volume depletion, progression of his malignancy, and possible infectious process -Patient is anuric so urine sample unable to be collected -continue empiric vancomycin and cefepime -B12--782 -TSH--8.445 -Folic acid--4.6 -10/05/23--after GOC discussion, patient's focus of care transitioned to comfort measures.   Hypotension/Hypothermia -Likely related to volume depletion although concerned about underlying infectious process -Started  empiric Antibiotics -midodrine started -Check PCT 0.22 -Continue judicious IV fluids -am cortisol--20.9 -CT AP--increase central necrosis with hepatic mets; mild intrahepatic and extrahepatic ductal dilatation with filling defects in CBD;  omental and mesenteric nodularity; rectal wall thickening -3/19 blood cultures neg -10/05/23--after GOC discussion, patient's focus of care transitioned to comfort measures.   Dilated biliary ducts -concerned about choledocholithiasis on CT -3/20 MRCP--Widespread hepatic metastatic disease; Mild intra and extrahepatic biliary dilatation. The small partially calcified stones in the distal common bile duct on recent CT are not well seen on this examination due to motion artifact, although are grossly unchanged; peritoneal carcinomatosis -10/05/23--after GOC discussion, patient's focus of care transitioned to comfort measures.   Diarrhea -Related to the patient's carcinoid tumor -Check C. Difficile--neg -Stool pathogen panel--pending   Intractable vomiting -Started around-the-clock Compazine -Initially started pantoprazole -improving -10/05/23--after GOC discussion, patient's focus of care transitioned to comfort measures.   Lactic acidosis -Suspect decreased clearance in the setting of ESRD -Empiric antibiotics as discussed above -Judicious IV fluids -Repeat lactic acid 7.5>>1.4 -Check VBG 7.27/40/<31/18   Metastatic carcinoid tumor to the peritoneum and liver -No longer a candidate for further therapy   ESRD -Nephrology consult for maintenance dialysis -Normally dialyzes TTS in [place] -Last dialysis 09/29/2023--patient dialyzed 2 hours -nephrology consulted for HD--spoke with Dr. Romelle Starcher -pt is not tolerating HD due to hypotension -10/05/23--after GOC discussion, patient's focus of care transitioned to comfort measures.   Rectal bleeding -GI consult appreicated -spouse does not want any further invasive procedures   Goals of Care -discussed GOC daily with spouse -we discussed pt's overall poor prognosis in the setting of his progressive metastatic cancer, frailty, poor functional baseline and multiple acute medical issues including but not limited to his GI bleed,  choledocholithiasis and intolerance of dialysis -She expressed that pt has stated multiple times that patient that he is "tired and ready to be done with it" -She and patient do wish any further pain and suffering and wish to transition focus of care to focus of full comfort. -3/22--wife expressed that she wishes to take him home with hospice and requested hospital bed               Family Communication:   wife at bedside 3/22   Consultants:  renal   Code Status:   DNR   DVT Prophylaxis:  SCDs     Procedures: As Listed in Progress Note Above   Antibiotics: Vanc 3/20>>3/21 Cefepime 3/20>>3/21         Subjective: Patient denies fevers, chills, headache, chest pain, dyspnea, nausea, vomiting, diarrhea, abdominal pain, dysuria, hematuria,   Objective: Vitals:   10/06/23 0700 10/06/23 0730 10/06/23 0800 10/06/23 0900  BP:      Pulse: 71  77 89  Resp: 13  17 12   Temp:  97.6 F (36.4 C)    TempSrc:  Axillary    SpO2: 100%  100% 100%  Weight:      Height:       No intake or output data in the 24 hours ending 10/06/23 1715 Weight change:  Exam:  General:  Pt is alert, follows commands appropriately, not in acute distress HEENT: No icterus, No thrush, No neck mass, [place]/AT Cardiovascular: RRR, S1/S2, no rubs, no gallops Respiratory: bibasilar crackles.  No wheeze Abdomen: Soft/+BS, non tender, non distended, no guarding Extremities: No edema, No lymphangitis, No petechiae, No rashes, no synovitis   Data Reviewed: I have personally reviewed following labs and imaging studies Basic Metabolic Panel: Recent  Labs  Lab 10/03/23 1216 10/04/23 0505 10/05/23 0513  NA 139 139 137  K 3.9 3.8 3.7  CL 94* 99 98  CO2 20* 24 26  GLUCOSE 108* 79 79  BUN 36* 43* 29*  CREATININE 7.05* 7.43* 4.77*  CALCIUM 9.1 8.2* 7.6*  PHOS  --  4.0  --    Liver Function Tests: Recent Labs  Lab 10/03/23 1216 10/04/23 0505 10/04/23 0509 10/05/23 0513  AST 43*  --  59* 38  ALT  12  --  12 10  ALKPHOS 76  --  58 53  BILITOT 2.0*  --  1.5* 1.4*  PROT 6.4*  --  5.1* 4.9*  ALBUMIN 3.2* 2.6* 2.6* 2.6*   Recent Labs  Lab 10/03/23 1559  LIPASE 25   No results for input(s): "AMMONIA" in the last 168 hours. Coagulation Profile: No results for input(s): "INR", "PROTIME" in the last 168 hours. CBC: Recent Labs  Lab 10/03/23 1216 10/04/23 0509 10/05/23 0513  WBC 7.8 8.5 8.0  NEUTROABS 7.3  --   --   HGB 11.9* 9.7* 8.7*  HCT 38.5* 31.2* 27.2*  MCV 97.0 95.1 93.2  PLT 220 140* 97*   Cardiac Enzymes: Recent Labs  Lab 10/03/23 1559  CKTOTAL 74   BNP: Invalid input(s): "POCBNP" CBG: No results for input(s): "GLUCAP" in the last 168 hours. HbA1C: No results for input(s): "HGBA1C" in the last 72 hours. Urine analysis: No results found for: "COLORURINE", "APPEARANCEUR", "LABSPEC", "PHURINE", "GLUCOSEU", "HGBUR", "BILIRUBINUR", "KETONESUR", "PROTEINUR", "UROBILINOGEN", "NITRITE", "LEUKOCYTESUR" Sepsis Labs: @LABRCNTIP (procalcitonin:4,lacticidven:4) ) Recent Results (from the past 240 hours)  Culture, blood (routine x 2)     Status: None (Preliminary result)   Collection Time: 10/03/23 12:16 PM   Specimen: BLOOD  Result Value Ref Range Status   Specimen Description BLOOD BLOOD RIGHT ARM  Final   Special Requests   Final    BOTTLES DRAWN AEROBIC AND ANAEROBIC Blood Culture results may not be optimal due to an inadequate volume of blood received in culture bottles   Culture   Final    NO GROWTH 3 DAYS Performed at [facility], [address] [place], [place] [address]    Report Status PENDING  Incomplete  Culture, blood (routine x 2)     Status: None (Preliminary result)   Collection Time: 10/03/23 12:16 PM   Specimen: BLOOD  Result Value Ref Range Status   Specimen Description BLOOD RFOA  Final   Special Requests   Final    Blood Culture results may not be optimal due to an inadequate volume of blood received in culture bottles BOTTLES DRAWN AEROBIC  ONLY   Culture   Final    NO GROWTH 3 DAYS Performed at [facility], [address], [place], [place] [address]    Report Status PENDING  Incomplete  Resp panel by RT-PCR (RSV, Flu A&B, Covid) Anterior Nasal Swab     Status: None   Collection Time: 10/03/23  7:33 PM   Specimen: Anterior Nasal Swab  Result Value Ref Range Status   SARS Coronavirus 2 by RT PCR NEGATIVE NEGATIVE Final    Comment: (NOTE) SARS-CoV-2 target nucleic acids are NOT DETECTED.  The SARS-CoV-2 RNA is generally detectable in upper respiratory specimens during the acute phase of infection. The lowest concentration of SARS-CoV-2 viral copies this assay can detect is 138 copies/mL. A negative result does not preclude SARS-Cov-2 infection and should not be used as the sole basis for treatment or other patient management decisions. A  negative result may occur with  improper specimen collection/handling, submission of specimen other than nasopharyngeal swab, presence of viral mutation(s) within the areas targeted by this assay, and inadequate number of viral copies(<138 copies/mL). A negative result must be combined with clinical observations, patient history, and epidemiological information. The expected result is Negative.  Fact Sheet for Patients:  BloggerCourse.com  Fact Sheet for Healthcare Providers:  SeriousBroker.it  This test is no t yet approved or cleared by the [place] FDA and  has been authorized for detection and/or diagnosis of SARS-CoV-2 by FDA under an Emergency Use Authorization (EUA). This EUA will remain  in effect (meaning this test can be used) for the duration of the COVID-19 declaration under Section 564(b)(1) of the Act, 21 U.S.C.section 360bbb-3(b)(1), unless the authorization is terminated  or revoked sooner.       Influenza A by PCR NEGATIVE NEGATIVE Final   Influenza B by PCR NEGATIVE NEGATIVE Final    Comment: (NOTE) The  Xpert Xpress SARS-CoV-2/FLU/RSV plus assay is intended as an aid in the diagnosis of influenza from Nasopharyngeal swab specimens and should not be used as a sole basis for treatment. Nasal washings and aspirates are unacceptable for Xpert Xpress SARS-CoV-2/FLU/RSV testing.  Fact Sheet for Patients: BloggerCourse.com  Fact Sheet for Healthcare Providers: SeriousBroker.it  This test is not yet approved or cleared by the [place] FDA and has been authorized for detection and/or diagnosis of SARS-CoV-2 by FDA under an Emergency Use Authorization (EUA). This EUA will remain in effect (meaning this test can be used) for the duration of the COVID-19 declaration under Section 564(b)(1) of the Act, 21 U.S.C. section 360bbb-3(b)(1), unless the authorization is terminated or revoked.     Resp Syncytial Virus by PCR NEGATIVE NEGATIVE Final    Comment: (NOTE) Fact Sheet for Patients: BloggerCourse.com  Fact Sheet for Healthcare Providers: SeriousBroker.it  This test is not yet approved or cleared by the [place] FDA and has been authorized for detection and/or diagnosis of SARS-CoV-2 by FDA under an Emergency Use Authorization (EUA). This EUA will remain in effect (meaning this test can be used) for the duration of the COVID-19 declaration under Section 564(b)(1) of the Act, 21 U.S.C. section 360bbb-3(b)(1), unless the authorization is terminated or revoked.  Performed at [facility], [address] [place], [place] [address]   C Difficile Quick Screen w PCR reflex     Status: None   Collection Time: 10/03/23  7:57 PM   Specimen: Stool  Result Value Ref Range Status   C Diff antigen NEGATIVE NEGATIVE Final   C Diff toxin NEGATIVE NEGATIVE Final   C Diff interpretation No C. difficile detected.  Final    Comment: Performed at [facility], [address] [place], [place]  [address]  MRSA Next Gen by PCR, Nasal     Status: None   Collection Time: 10/03/23  8:27 PM   Specimen: Nasal Mucosa; Nasal Swab  Result Value Ref Range Status   MRSA by PCR Next Gen NOT DETECTED NOT DETECTED Final    Comment: (NOTE) The GeneXpert MRSA Assay (FDA approved for NASAL specimens only), is one component of a comprehensive MRSA colonization surveillance program. It is not intended to diagnose MRSA infection nor to guide or monitor treatment for MRSA infections. Test performance is not FDA approved in patients less than 17 years old. Performed at [facility], [address] [place], [place] [address]   Gastrointestinal Panel by PCR , Stool  Status: Abnormal   Collection Time: 10/05/23 11:42 AM   Specimen: Stool  Result Value Ref Range Status   Campylobacter species NOT DETECTED NOT DETECTED Final   Plesimonas shigelloides NOT DETECTED NOT DETECTED Final   Salmonella species NOT DETECTED NOT DETECTED Final   Yersinia enterocolitica NOT DETECTED NOT DETECTED Final   Vibrio species NOT DETECTED NOT DETECTED Final   Vibrio cholerae NOT DETECTED NOT DETECTED Final   Enteroaggregative E coli (EAEC) NOT DETECTED NOT DETECTED Final   Enteropathogenic E coli (EPEC) NOT DETECTED NOT DETECTED Final   Enterotoxigenic E coli (ETEC) NOT DETECTED NOT DETECTED Final   Shiga like toxin producing E coli (STEC) NOT DETECTED NOT DETECTED Final   Shigella/Enteroinvasive E coli (EIEC) NOT DETECTED NOT DETECTED Final   Cryptosporidium NOT DETECTED NOT DETECTED Final   Cyclospora cayetanensis NOT DETECTED NOT DETECTED Final   Entamoeba histolytica NOT DETECTED NOT DETECTED Final   Giardia lamblia NOT DETECTED NOT DETECTED Final   Adenovirus F40/41 NOT DETECTED NOT DETECTED Final   Astrovirus NOT DETECTED NOT DETECTED Final   Norovirus GI/GII DETECTED (A) NOT DETECTED Final    Comment: CRITICAL RESULT CALLED TO, READ BACK BY AND VERIFIED WITH: Shaune Leeks RN @ 0020 10/06/23 [facility]     Rotavirus A NOT DETECTED NOT DETECTED Final   Sapovirus (I, II, IV, and V) NOT DETECTED NOT DETECTED Final    Comment: Performed at [facility], [address] [place], [place] [address]     Scheduled Meds:  Chlorhexidine Gluconate Cloth  6 each Topical Q0600   Chlorhexidine Gluconate Cloth  6 each Topical Q0600   prochlorperazine  5 mg Oral Q6H   Continuous Infusions:  Procedures/Studies: MR ABDOMEN MRCP WO CONTRAST Result Date: 10/04/2023 CLINICAL DATA:  Cholelithiasis Metastatic carcinoid tumor. EXAM: MRI ABDOMEN WITHOUT CONTRAST  (INCLUDING MRCP) TECHNIQUE: Multiplanar multisequence MR imaging of the abdomen was performed. Heavily T2-weighted images of the biliary and pancreatic ducts were obtained, and three-dimensional MRCP images were rendered by post processing. COMPARISON:  Abdominopelvic CT 10/03/2023 and 09/20/2023 FINDINGS: Technical note: Despite efforts by the technologist and patient, mild motion artifact is present on today's exam and could not be eliminated. This reduces exam sensitivity and specificity. Lower chest:  Small left pleural effusion, unchanged from recent CT. Hepatobiliary: Widespread hepatic metastatic disease again noted, unchanged from recent CT. Largest lesion centrally in the right and left lobes measures up to 9.1 x 6.3 cm on image 14/4. These lesions demonstrate heterogeneous T1 and T2 hyperintensity. No contrast was administered. 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 Small gallstones with mild gallbladder distention. No gallbladder wall thickening or surrounding inflammation. Mild intra and extrahepatic biliary dilatation. The common hepatic duct measures up to 10 mm in diameter. The small partially calcified stones in the distal common bile duct on recent CT are not well seen on this examination due to motion artifact, although are grossly unchanged. Pancreas: Mild atrophy. No focal abnormality, surrounding inflammation or  ductal dilatation. Spleen: Normal in size without focal abnormality. 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 Adrenals/Urinary Tract: The left adrenal gland appears normal. The right adrenal gland and right kidney are not demonstrated. Left renal cortical thinning with scattered cyst formation. No hydronephrosis. Stomach/Bowel: The stomach appears unremarkable for its degree of distension. No evidence of bowel wall thickening, distention or surrounding inflammatory change. Vascular/Lymphatic: There are no enlarged abdominal lymph nodes. No acute vascular findings. Other: Generalized soft tissue edema. Probable peritoneal and umbilical carcinomatosis,  not further characterized by this noncontrast study. Musculoskeletal: No acute or significant osseous findings. No evidence of osseous metastatic disease. L5-S1 interbody ankylosis. IMPRESSION: 1. Widespread hepatic metastatic disease, unchanged from recent CT. 2. Cholelithiasis with mild gallbladder distention. No evidence of acute cholecystitis. 3. Mild intra and extrahepatic biliary dilatation. The small partially calcified stones in the distal common bile duct on recent CT are not well seen on this examination due to motion artifact, although are grossly unchanged. 4. 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 5. Small left pleural effusion, unchanged from recent CT. 6. 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 Electronically Signed   By: Carey Bullocks M.D.   On: 10/04/2023 20:36   CT ABDOMEN PELVIS W CONTRAST Result Date: 10/03/2023 CLINICAL DATA:  Metastatic carcinoid tumor weakness. Mechanical fall. Abdominal pain. Sepsis. * Tracking Code: BO * EXAM: CT ABDOMEN AND PELVIS WITH CONTRAST TECHNIQUE: Multidetector CT imaging of the abdomen and pelvis was performed using the standard protocol following bolus administration of intravenous contrast. RADIATION DOSE  REDUCTION: This exam was performed according to the departmental dose-optimization program which includes automated exposure control, adjustment of the mA and/or kV according to patient size and/or use of iterative reconstruction technique. CONTRAST:  OMNIPAQUE IOHEXOL 300 MG/ML  SOLN COMPARISON:  09/20/2023 FINDINGS: Lower chest: Small left pleural effusion. Hepatobiliary: Increased central necrosis and hypodensity associated with scattered metastatic lesions throughout the liver. This includes varicoid lucency in a 9.4 cm left hepatic lobe metastatic lesion. Mild intrahepatic biliary dilatation along with extrahepatic biliary dilatation extending to a cluster of spell to distal CBD filling defects each measuring about 0.4 cm in diameter shown for example on image 37 series 4 and image 29 series 2, suspicious for choledocholithiasis. Mildly distended gallbladder. Pancreas: Unremarkable Spleen: Unremarkable Adrenals/Urinary Tract: Absent right kidney. Small left kidney unchanged in appearance from the recent prior exam. Wall thickening along the urinary bladder, cystitis not excluded although some of this may be from nondistention. No hydronephrosis or hydroureter. Stomach/Bowel: Contrast medium in stool in the mildly distended rectum, with substantial indistinctly marginated rectal wall thickening. Cannot exclude stercoral colitis or proctitis. This has worsened compared to previous. No dilated bowel. Vascular/Lymphatic: Minimal iliac artery atheromatous vascular calcification. Reproductive: Fiducials along the posterior margin of the prostate gland. Other: Omental and mesenteric nodularity compatible with tumor. This is most confluent in the periumbilical region or bilobed tumor is present within and underlying the umbilicus measuring 2.7 by 2.6 cm on image 43 series 2, stable in appearance. There is tumor with accentuated neovascularity along the right paracolic gutter just below the right hepatic lobe.  Subcutaneous and mesenteric edema compatible with progressive third spacing of fluids. Musculoskeletal: Old healed left pelvic fractures. Lower lumbar spondylosis and degenerative disc disease with fusion between the L5 and S1 vertebral bodies. IMPRESSION: 1. Increased central necrosis and hypodensity associated with scattered metastatic lesions throughout the liver. 2. Mild intrahepatic and extrahepatic biliary dilatation extending to a cluster of two filling defects in the distal CBD each measuring about 0.4 cm in diameter, compatible with choledocholithiasis. 3. Omental and mesenteric nodularity compatible with tumor, similar to previous. 4. Contrast medium and stool in the mildly distended rectum, with substantial indistinctly marginated rectal wall thickening. Cannot exclude stercoral colitis or proctitis. This has worsened compared to previous. 5. Wall thickening along the urinary bladder, cystitis not excluded although some of this may be from nondistention. 6. Small left pleural effusion. 7. Subcutaneous  and mesenteric edema compatible with progressive third spacing of fluids. 8. Old healed left pelvic fractures. Electronically Signed   By: Gaylyn Rong M.D.   On: 10/03/2023 18:26   DG Chest Portable 1 View Result Date: 10/03/2023 CLINICAL DATA:  Altered mental status. EXAM: PORTABLE CHEST 1 VIEW COMPARISON:  09/20/2023. FINDINGS: Bilateral lung fields are clear. Bilateral costophrenic angles are clear. Normal cardio-mediastinal silhouette. No acute osseous abnormalities. The soft tissues are within normal limits. IMPRESSION: No active disease. Electronically Signed   By: Jules Schick M.D.   On: 10/03/2023 14:15   CT ABDOMEN PELVIS W CONTRAST Result Date: 09/20/2023 CLINICAL DATA:  Acute abdominal pain and recent missed dialysis sessions, initial encounter EXAM: CT ABDOMEN AND PELVIS WITH CONTRAST TECHNIQUE: Multidetector CT imaging of the abdomen and pelvis was performed using the standard  protocol following bolus administration of intravenous contrast. RADIATION DOSE REDUCTION: This exam was performed according to the departmental dose-optimization program which includes automated exposure control, adjustment of the mA and/or kV according to patient size and/or use of iterative reconstruction technique. CONTRAST:  OMNIPAQUE IOHEXOL 300 MG/ML  SOLN COMPARISON:  08/23/2023 FINDINGS: Lower chest: No acute abnormality. Hepatobiliary: Liver again demonstrates peripherally enhancing metastatic lesions which appear roughly similar to that seen on the prior exam but better visualized due to the contrast enhancement. The gallbladder is well distended. Single dependent gallstone is seen. Cystic duct and common bile duct are dilated no definitive choledocholithiasis is seen. Pancreas: Mild dilatation of the pancreatic duct is noted. No other focal pancreas abnormality is noted. Spleen: Normal in size without focal abnormality. Adrenals/Urinary Tract: Adrenal glands are within normal limits. Changes of prior right nephrectomy are again noted. Visualized left kidney shows normal enhancement. Cystic changes are seen. A focal hypodense lesion is again identified stable in appearance from the prior study. Bladder is decompressed. Stomach/Bowel: No obstructive or inflammatory changes of the colon are noted. The appendix is within normal limits. Mild diverticular change of the colon is noted. Stomach and small bowel are within normal limits. Vascular/Lymphatic: Aortic atherosclerosis. 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 This measures approximately 2.9 cm in greatest dimension. Preaortic node is noted on image number 26 of series 2 similar to that noted on prior PET-CT. Prominent right iliac node is noted on image number 56 of series 2 corresponding the prior PET-CT. Similar enhancing lesion is noted adjacent to the tip of the liver also  stable from the prior PET-CT. Reproductive: Prostate is unremarkable. Other: Small fluid containing right inguinal hernia is noted new from the prior study. Small umbilical hernia is identified containing enhancing tissue consistent with the known metastatic lesions. no significant free fluid is noted. Musculoskeletal: Degenerative changes of lumbar spine are noted. No acute bony abnormality is seen. 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 Metastatic lesions are noted herniating into a small umbilical hernia. Fluid containing right inguinal hernia. Status post right nephrectomy. Cholelithiasis without choledocholithiasis. Prominence of the common bile duct and cystic duct are seen. Electronically Signed   By: Alcide Clever M.D.   On: 09/20/2023 19:17   DG Chest Port 1 View Result Date: 09/20/2023 CLINICAL DATA:  Shortness of breath. EXAM: PORTABLE CHEST 1 VIEW COMPARISON:  PET-CT dated 08/08/2023 FINDINGS: Normal sized heart. Tortuous and partially calcified thoracic aorta. Clear lungs with normal  vascularity. Diffuse osteopenia and mild thoracic spine degenerative changes. IMPRESSION: No acute abnormality. Electronically Signed   By: Beckie Salts M.D.   On: 09/20/2023 16:50    Catarina Hartshorn, DO  [facility]  If 7PM-7AM, please contact night-coverage [email or web address] Password [email or web address] 10/06/2023, 5:15 PM   LOS: 3 days

## 2023-10-07 DIAGNOSIS — I9589 Other hypotension: Secondary | ICD-10-CM | POA: Diagnosis not present

## 2023-10-07 DIAGNOSIS — C7A098 Malignant carcinoid tumors of other sites: Secondary | ICD-10-CM | POA: Diagnosis not present

## 2023-10-07 DIAGNOSIS — E872 Acidosis, unspecified: Secondary | ICD-10-CM | POA: Diagnosis not present

## 2023-10-07 DIAGNOSIS — N186 End stage renal disease: Secondary | ICD-10-CM | POA: Diagnosis not present

## 2023-10-07 NOTE — Plan of Care (Signed)

## 2023-10-07 NOTE — Progress Notes (Signed)
 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  The patient spouse is at the bedside to supplement the history.  She states that the patient has had a gradual functional decline over the past 3 to 4 months.  She states that he has lost 100 pounds in the past 6 months.  The patient frequently misses dialysis because of generalized weakness and just not wanting to go.  His last dialysis session was on 09/29/2023.  She states that the patient only had 1 dialysis session all last week prior to this admission.  She states that his dialysis sessions are sometimes cut off short because of his low blood pressure. Apparently the patient lost his balance and fell onto the floor on the morning of 10/03/2023.  He had difficulty getting up.  As result, the patient was brought to emergency department for further evaluation.  The patient himself denies any chest pain, shortness breath, abdominal pain, headache, neck pain.  He denies any fevers or chills.  He states that he has chronic loose stools.  On the average, the patient has 2-3 loose bowel movements on a daily basis.  He states that he has been having dry heaves on a daily basis at least for the past 2 to 3 months.  His appetite has been poor.  He denies any new medications.  He is having difficulty keeping his medications down at home. The patient states that he occasionally has some hematochezia with his bowel movements.  He denies any melena or hematemesis. In the ED, the patient was hypothermic initially with a temperature of 94.7 F.  He was hypotensive with SBP in the 70s initially.  The patient was given 1 L of fluids with some improvement.  WBC 7.8, hemoglobin 9.9, platelets 220.  Sodium 139, potassium 3.9,  bicarbonate 20, serum creatinine 7.05.  AST 43, ALT 12, alk phosphatase 26, total bilirubin 2.0. Chest x-ray was negative for any infiltrates.  Lactic acid 7.5.  EKG showed sinus tachycardia. The patient was fluid resuscitated and started on midodrine with some improvement in his blood pressure.  However, the patient still had difficulty tolerating dialysis secondary to hypotension. The case was discussed with nephrology who felt that the patient is a poor candidate for further dialysis in the setting of his metastatic cancer and poor tolerance of dialysis at this point. During the hospitalization, CT of the abdomen and pelvis continue to showed peritoneal carcinomatosis and it also showed choledocholithiasis.  MRCP was obtained and also suggested choledocholithiasis. The patient developed rectal bleeding during hospitalization.  GI was consulted.  Given the patient's poor baseline functioning, frailty, metastatic cancer, and seriousness of the acute medical conditions goals of care discussions were held with the patient's spouse and patient.  Ultimately, it was determined in the patient's best interest to change his focus of care to focus on comfort measures.    Assessment/Plan:  Generalized weakness -Multifactorial including volume depletion, progression of his malignancy, and possible infectious process -Patient is anuric so urine sample unable to be collected -continue empiric vancomycin and cefepime -B12--782 -TSH--8.445 -Folic acid--4.6 -10/05/23--after GOC discussion, patient's focus of care transitioned to comfort measures.   Hypotension/Hypothermia -Likely related to volume depletion although concerned about underlying infectious process -Started  empiric Antibiotics -midodrine started -Check PCT 0.22 -Continue judicious IV fluids -am cortisol--20.9 -CT AP--increase central necrosis with hepatic mets; mild intrahepatic and extrahepatic ductal dilatation with filling defects in CBD;  omental and mesenteric nodularity; rectal wall thickening -3/19 blood cultures neg -10/05/23--after GOC discussion, patient's focus of care transitioned to comfort measures.   Dilated biliary ducts/Choledocholithiasis -concerned about choledocholithiasis on CT -3/20 MRCP--Widespread hepatic metastatic disease; Mild intra and extrahepatic biliary dilatation. The small partially calcified stones in the distal common bile duct on recent CT are not well seen on this examination due to motion artifact, although are grossly unchanged; peritoneal carcinomatosis -10/05/23--after GOC discussion, patient's focus of care transitioned to comfort measures.   Diarrhea -Related to the patient's carcinoid tumor -Check C. Difficile--neg -Stool pathogen panel--pending   Intractable vomiting -Started around-the-clock Compazine -Initially started pantoprazole -improving -10/05/23--after GOC discussion, patient's focus of care transitioned to comfort measures.   Lactic acidosis -Suspect decreased clearance in the setting of ESRD -Empiric antibiotics as discussed above -Judicious IV fluids -Repeat lactic acid 7.5>>1.4 -Check VBG 7.27/40/<31/18   Metastatic carcinoid tumor to the peritoneum and liver -No longer a candidate for further therapy   ESRD -Nephrology consult for maintenance dialysis -Normally dialyzes TTS in [place] -Last dialysis 09/29/2023--patient dialyzed 2 hours -nephrology consulted for HD--spoke with Dr. Romelle Starcher -pt is not tolerating HD due to hypotension -10/05/23--after GOC discussion, patient's focus of care transitioned to comfort measures.   Rectal bleeding -GI consult appreicated -spouse does not want any further invasive procedures   Goals of Care -discussed GOC daily with spouse -we discussed pt's overall poor prognosis in the setting of his progressive metastatic cancer, frailty, poor functional baseline and multiple acute medical issues including but not limited to his GI  bleed, choledocholithiasis and intolerance of dialysis -She expressed that pt has stated multiple times that patient that he is "tired and ready to be done with it" -She and patient do wish any further pain and suffering and wish to transition focus of care to focus of full comfort. -3/22--wife expressed that she wishes to take him home with hospice and requested hospital bed               Family Communication:   wife at bedside 3/23   Consultants:  renal   Code Status:   DNR   DVT Prophylaxis:  SCDs     Procedures: As Listed in Progress Note Above   Antibiotics: Vanc 3/20>>3/21 Cefepime 3/20>>3/21              Subjective: Pt denies f/c, cp, sob, n/v/d, abd pain  Objective: Vitals:   10/06/23 0900 10/06/23 2024 10/07/23 0026 10/07/23 0119  BP:    125/71  Pulse: 89   71  Resp: 12   14  Temp:  (!) 97.2 F (36.2 C) (!) 96.2 F (35.7 C)   TempSrc:  Axillary Axillary   SpO2: 100%   99%  Weight:      Height:       No intake or output data in the 24 hours ending 10/07/23 1659 Weight change:  Exam:  General:  Pt is alert, follows commands appropriately, not in acute distress HEENT: No icterus, No thrush, No neck mass, [place]/AT Cardiovascular: RRR, S1/S2, no rubs, no gallops Respiratory: bibasilar rales. No wheeze Abdomen: Soft/+BS, non tender, non distended, no guarding Extremities: No edema, No lymphangitis, No petechiae, No rashes, no synovitis   Data Reviewed: I have personally reviewed following labs and imaging studies Basic Metabolic Panel:  Recent Labs  Lab 10/03/23 1216 10/04/23 0505 10/05/23 0513  NA 139 139 137  K 3.9 3.8 3.7  CL 94* 99 98  CO2 20* 24 26  GLUCOSE 108* 79 79  BUN 36* 43* 29*  CREATININE 7.05* 7.43* 4.77*  CALCIUM 9.1 8.2* 7.6*  PHOS  --  4.0  --    Liver Function Tests: Recent Labs  Lab 10/03/23 1216 10/04/23 0505 10/04/23 0509 10/05/23 0513  AST 43*  --  59* 38  ALT 12  --  12 10  ALKPHOS 76  --  58 53   BILITOT 2.0*  --  1.5* 1.4*  PROT 6.4*  --  5.1* 4.9*  ALBUMIN 3.2* 2.6* 2.6* 2.6*   Recent Labs  Lab 10/03/23 1559  LIPASE 25   No results for input(s): "AMMONIA" in the last 168 hours. Coagulation Profile: No results for input(s): "INR", "PROTIME" in the last 168 hours. CBC: Recent Labs  Lab 10/03/23 1216 10/04/23 0509 10/05/23 0513  WBC 7.8 8.5 8.0  NEUTROABS 7.3  --   --   HGB 11.9* 9.7* 8.7*  HCT 38.5* 31.2* 27.2*  MCV 97.0 95.1 93.2  PLT 220 140* 97*   Cardiac Enzymes: Recent Labs  Lab 10/03/23 1559  CKTOTAL 74   BNP: Invalid input(s): "POCBNP" CBG: No results for input(s): "GLUCAP" in the last 168 hours. HbA1C: No results for input(s): "HGBA1C" in the last 72 hours. Urine analysis: No results found for: "COLORURINE", "APPEARANCEUR", "LABSPEC", "PHURINE", "GLUCOSEU", "HGBUR", "BILIRUBINUR", "KETONESUR", "PROTEINUR", "UROBILINOGEN", "NITRITE", "LEUKOCYTESUR" Sepsis Labs: @LABRCNTIP (procalcitonin:4,lacticidven:4) ) Recent Results (from the past 240 hours)  Culture, blood (routine x 2)     Status: None (Preliminary result)   Collection Time: 10/03/23 12:16 PM   Specimen: BLOOD  Result Value Ref Range Status   Specimen Description BLOOD BLOOD RIGHT ARM  Final   Special Requests   Final    BOTTLES DRAWN AEROBIC AND ANAEROBIC Blood Culture results may not be optimal due to an inadequate volume of blood received in culture bottles   Culture   Final    NO GROWTH 4 DAYS Performed at [facility], [address] [place], [place] [address]    Report Status PENDING  Incomplete  Culture, blood (routine x 2)     Status: None (Preliminary result)   Collection Time: 10/03/23 12:16 PM   Specimen: BLOOD  Result Value Ref Range Status   Specimen Description BLOOD RFOA  Final   Special Requests   Final    Blood Culture results may not be optimal due to an inadequate volume of blood received in culture bottles BOTTLES DRAWN AEROBIC ONLY   Culture   Final    NO GROWTH  4 DAYS Performed at [facility], [address] [place], [place] [address]    Report Status PENDING  Incomplete  Resp panel by RT-PCR (RSV, Flu A&B, Covid) Anterior Nasal Swab     Status: None   Collection Time: 10/03/23  7:33 PM   Specimen: Anterior Nasal Swab  Result Value Ref Range Status   SARS Coronavirus 2 by RT PCR NEGATIVE NEGATIVE Final    Comment: (NOTE) SARS-CoV-2 target nucleic acids are NOT DETECTED.  The SARS-CoV-2 RNA is generally detectable in upper respiratory specimens during the acute phase of infection. The lowest concentration of SARS-CoV-2 viral copies this assay can detect is 138 copies/mL. A negative result does not preclude SARS-Cov-2 infection and should not be used as the sole basis for treatment or other patient management decisions.  A negative result may occur with  improper specimen collection/handling, submission of specimen other than nasopharyngeal swab, presence of viral mutation(s) within the areas targeted by this assay, and inadequate number of viral copies(<138 copies/mL). A negative result must be combined with clinical observations, patient history, and epidemiological information. The expected result is Negative.  Fact Sheet for Patients:  BloggerCourse.com  Fact Sheet for Healthcare Providers:  SeriousBroker.it  This test is no t yet approved or cleared by the [place] FDA and  has been authorized for detection and/or diagnosis of SARS-CoV-2 by FDA under an Emergency Use Authorization (EUA). This EUA will remain  in effect (meaning this test can be used) for the duration of the COVID-19 declaration under Section 564(b)(1) of the Act, 21 U.S.C.section 360bbb-3(b)(1), unless the authorization is terminated  or revoked sooner.       Influenza A by PCR NEGATIVE NEGATIVE Final   Influenza B by PCR NEGATIVE NEGATIVE Final    Comment: (NOTE) The Xpert Xpress SARS-CoV-2/FLU/RSV plus  assay is intended as an aid in the diagnosis of influenza from Nasopharyngeal swab specimens and should not be used as a sole basis for treatment. Nasal washings and aspirates are unacceptable for Xpert Xpress SARS-CoV-2/FLU/RSV testing.  Fact Sheet for Patients: BloggerCourse.com  Fact Sheet for Healthcare Providers: SeriousBroker.it  This test is not yet approved or cleared by the [place] FDA and has been authorized for detection and/or diagnosis of SARS-CoV-2 by FDA under an Emergency Use Authorization (EUA). This EUA will remain in effect (meaning this test can be used) for the duration of the COVID-19 declaration under Section 564(b)(1) of the Act, 21 U.S.C. section 360bbb-3(b)(1), unless the authorization is terminated or revoked.     Resp Syncytial Virus by PCR NEGATIVE NEGATIVE Final    Comment: (NOTE) Fact Sheet for Patients: BloggerCourse.com  Fact Sheet for Healthcare Providers: SeriousBroker.it  This test is not yet approved or cleared by the [place] FDA and has been authorized for detection and/or diagnosis of SARS-CoV-2 by FDA under an Emergency Use Authorization (EUA). This EUA will remain in effect (meaning this test can be used) for the duration of the COVID-19 declaration under Section 564(b)(1) of the Act, 21 U.S.C. section 360bbb-3(b)(1), unless the authorization is terminated or revoked.  Performed at [facility], [address], [place], [place] [address]   C Difficile Quick Screen w PCR reflex     Status: None   Collection Time: 10/03/23  7:57 PM   Specimen: Stool  Result Value Ref Range Status   C Diff antigen NEGATIVE NEGATIVE Final   C Diff toxin NEGATIVE NEGATIVE Final   C Diff interpretation No C. difficile detected.  Final    Comment: Performed at [facility], [address] [place], [place] [address]  MRSA Next Gen by PCR, Nasal      Status: None   Collection Time: 10/03/23  8:27 PM   Specimen: Nasal Mucosa; Nasal Swab  Result Value Ref Range Status   MRSA by PCR Next Gen NOT DETECTED NOT DETECTED Final    Comment: (NOTE) The GeneXpert MRSA Assay (FDA approved for NASAL specimens only), is one component of a comprehensive MRSA colonization surveillance program. It is not intended to diagnose MRSA infection nor to guide or monitor treatment for MRSA infections. Test performance is not FDA approved in patients less than 79 years old. Performed at [facility], [address] [place], [place] [address]   Gastrointestinal Panel by PCR , Stool  Status: Abnormal   Collection Time: 10/05/23 11:42 AM   Specimen: Stool  Result Value Ref Range Status   Campylobacter species NOT DETECTED NOT DETECTED Final   Plesimonas shigelloides NOT DETECTED NOT DETECTED Final   Salmonella species NOT DETECTED NOT DETECTED Final   Yersinia enterocolitica NOT DETECTED NOT DETECTED Final   Vibrio species NOT DETECTED NOT DETECTED Final   Vibrio cholerae NOT DETECTED NOT DETECTED Final   Enteroaggregative E coli (EAEC) NOT DETECTED NOT DETECTED Final   Enteropathogenic E coli (EPEC) NOT DETECTED NOT DETECTED Final   Enterotoxigenic E coli (ETEC) NOT DETECTED NOT DETECTED Final   Shiga like toxin producing E coli (STEC) NOT DETECTED NOT DETECTED Final   Shigella/Enteroinvasive E coli (EIEC) NOT DETECTED NOT DETECTED Final   Cryptosporidium NOT DETECTED NOT DETECTED Final   Cyclospora cayetanensis NOT DETECTED NOT DETECTED Final   Entamoeba histolytica NOT DETECTED NOT DETECTED Final   Giardia lamblia NOT DETECTED NOT DETECTED Final   Adenovirus F40/41 NOT DETECTED NOT DETECTED Final   Astrovirus NOT DETECTED NOT DETECTED Final   Norovirus GI/GII DETECTED (A) NOT DETECTED Final    Comment: CRITICAL RESULT CALLED TO, READ BACK BY AND VERIFIED WITH: Shaune Leeks RN @ 0020 10/06/23 [facility]    Rotavirus A NOT DETECTED NOT DETECTED Final    Sapovirus (I, II, IV, and V) NOT DETECTED NOT DETECTED Final    Comment: Performed at [facility], [address] [place], [place] [address]     Scheduled Meds:  Chlorhexidine Gluconate Cloth  6 each Topical Q0600   Chlorhexidine Gluconate Cloth  6 each Topical Q0600   prochlorperazine  5 mg Oral Q6H   Continuous Infusions:  Procedures/Studies: MR ABDOMEN MRCP WO CONTRAST Result Date: 10/04/2023 CLINICAL DATA:  Cholelithiasis Metastatic carcinoid tumor. EXAM: MRI ABDOMEN WITHOUT CONTRAST  (INCLUDING MRCP) TECHNIQUE: Multiplanar multisequence MR imaging of the abdomen was performed. Heavily T2-weighted images of the biliary and pancreatic ducts were obtained, and three-dimensional MRCP images were rendered by post processing. COMPARISON:  Abdominopelvic CT 10/03/2023 and 09/20/2023 FINDINGS: Technical note: Despite efforts by the technologist and patient, mild motion artifact is present on today's exam and could not be eliminated. This reduces exam sensitivity and specificity. Lower chest:  Small left pleural effusion, unchanged from recent CT. Hepatobiliary: Widespread hepatic metastatic disease again noted, unchanged from recent CT. Largest lesion centrally in the right and left lobes measures up to 9.1 x 6.3 cm on image 14/4. These lesions demonstrate heterogeneous T1 and T2 hyperintensity. No contrast was administered. 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 Small gallstones with mild gallbladder distention. No gallbladder wall thickening or surrounding inflammation. Mild intra and extrahepatic biliary dilatation. The common hepatic duct measures up to 10 mm in diameter. The small partially calcified stones in the distal common bile duct on recent CT are not well seen on this examination due to motion artifact, although are grossly unchanged. Pancreas: Mild atrophy. No focal abnormality, surrounding inflammation or ductal dilatation. Spleen: Normal in size without  focal abnormality. 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 Adrenals/Urinary Tract: The left adrenal gland appears normal. The right adrenal gland and right kidney are not demonstrated. Left renal cortical thinning with scattered cyst formation. No hydronephrosis. Stomach/Bowel: The stomach appears unremarkable for its degree of distension. No evidence of bowel wall thickening, distention or surrounding inflammatory change. Vascular/Lymphatic: There are no enlarged abdominal lymph nodes. No acute vascular findings. Other: Generalized soft tissue edema. Probable peritoneal and umbilical carcinomatosis,  not further characterized by this noncontrast study. Musculoskeletal: No acute or significant osseous findings. No evidence of osseous metastatic disease. L5-S1 interbody ankylosis. IMPRESSION: 1. Widespread hepatic metastatic disease, unchanged from recent CT. 2. Cholelithiasis with mild gallbladder distention. No evidence of acute cholecystitis. 3. Mild intra and extrahepatic biliary dilatation. The small partially calcified stones in the distal common bile duct on recent CT are not well seen on this examination due to motion artifact, although are grossly unchanged. 4. 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 5. Small left pleural effusion, unchanged from recent CT. 6. 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 Electronically Signed   By: Carey Bullocks M.D.   On: 10/04/2023 20:36   CT ABDOMEN PELVIS W CONTRAST Result Date: 10/03/2023 CLINICAL DATA:  Metastatic carcinoid tumor weakness. Mechanical fall. Abdominal pain. Sepsis. * Tracking Code: BO * EXAM: CT ABDOMEN AND PELVIS WITH CONTRAST TECHNIQUE: Multidetector CT imaging of the abdomen and pelvis was performed using the standard protocol following bolus administration of intravenous contrast. RADIATION DOSE REDUCTION: This exam was performed according to the  departmental dose-optimization program which includes automated exposure control, adjustment of the mA and/or kV according to patient size and/or use of iterative reconstruction technique. CONTRAST:  OMNIPAQUE IOHEXOL 300 MG/ML  SOLN COMPARISON:  09/20/2023 FINDINGS: Lower chest: Small left pleural effusion. Hepatobiliary: Increased central necrosis and hypodensity associated with scattered metastatic lesions throughout the liver. This includes varicoid lucency in a 9.4 cm left hepatic lobe metastatic lesion. Mild intrahepatic biliary dilatation along with extrahepatic biliary dilatation extending to a cluster of spell to distal CBD filling defects each measuring about 0.4 cm in diameter shown for example on image 37 series 4 and image 29 series 2, suspicious for choledocholithiasis. Mildly distended gallbladder. Pancreas: Unremarkable Spleen: Unremarkable Adrenals/Urinary Tract: Absent right kidney. Small left kidney unchanged in appearance from the recent prior exam. Wall thickening along the urinary bladder, cystitis not excluded although some of this may be from nondistention. No hydronephrosis or hydroureter. Stomach/Bowel: Contrast medium in stool in the mildly distended rectum, with substantial indistinctly marginated rectal wall thickening. Cannot exclude stercoral colitis or proctitis. This has worsened compared to previous. No dilated bowel. Vascular/Lymphatic: Minimal iliac artery atheromatous vascular calcification. Reproductive: Fiducials along the posterior margin of the prostate gland. Other: Omental and mesenteric nodularity compatible with tumor. This is most confluent in the periumbilical region or bilobed tumor is present within and underlying the umbilicus measuring 2.7 by 2.6 cm on image 43 series 2, stable in appearance. There is tumor with accentuated neovascularity along the right paracolic gutter just below the right hepatic lobe. Subcutaneous and mesenteric edema compatible with  progressive third spacing of fluids. Musculoskeletal: Old healed left pelvic fractures. Lower lumbar spondylosis and degenerative disc disease with fusion between the L5 and S1 vertebral bodies. IMPRESSION: 1. Increased central necrosis and hypodensity associated with scattered metastatic lesions throughout the liver. 2. Mild intrahepatic and extrahepatic biliary dilatation extending to a cluster of two filling defects in the distal CBD each measuring about 0.4 cm in diameter, compatible with choledocholithiasis. 3. Omental and mesenteric nodularity compatible with tumor, similar to previous. 4. Contrast medium and stool in the mildly distended rectum, with substantial indistinctly marginated rectal wall thickening. Cannot exclude stercoral colitis or proctitis. This has worsened compared to previous. 5. Wall thickening along the urinary bladder, cystitis not excluded although some of this may be from nondistention. 6. Small left pleural effusion. 7. Subcutaneous  and mesenteric edema compatible with progressive third spacing of fluids. 8. Old healed left pelvic fractures. Electronically Signed   By: Gaylyn Rong M.D.   On: 10/03/2023 18:26   DG Chest Portable 1 View Result Date: 10/03/2023 CLINICAL DATA:  Altered mental status. EXAM: PORTABLE CHEST 1 VIEW COMPARISON:  09/20/2023. FINDINGS: Bilateral lung fields are clear. Bilateral costophrenic angles are clear. Normal cardio-mediastinal silhouette. No acute osseous abnormalities. The soft tissues are within normal limits. IMPRESSION: No active disease. Electronically Signed   By: Jules Schick M.D.   On: 10/03/2023 14:15   CT ABDOMEN PELVIS W CONTRAST Result Date: 09/20/2023 CLINICAL DATA:  Acute abdominal pain and recent missed dialysis sessions, initial encounter EXAM: CT ABDOMEN AND PELVIS WITH CONTRAST TECHNIQUE: Multidetector CT imaging of the abdomen and pelvis was performed using the standard protocol following bolus administration of intravenous  contrast. RADIATION DOSE REDUCTION: This exam was performed according to the departmental dose-optimization program which includes automated exposure control, adjustment of the mA and/or kV according to patient size and/or use of iterative reconstruction technique. CONTRAST:  OMNIPAQUE IOHEXOL 300 MG/ML  SOLN COMPARISON:  08/23/2023 FINDINGS: Lower chest: No acute abnormality. Hepatobiliary: Liver again demonstrates peripherally enhancing metastatic lesions which appear roughly similar to that seen on the prior exam but better visualized due to the contrast enhancement. The gallbladder is well distended. Single dependent gallstone is seen. Cystic duct and common bile duct are dilated no definitive choledocholithiasis is seen. Pancreas: Mild dilatation of the pancreatic duct is noted. No other focal pancreas abnormality is noted. Spleen: Normal in size without focal abnormality. Adrenals/Urinary Tract: Adrenal glands are within normal limits. Changes of prior right nephrectomy are again noted. Visualized left kidney shows normal enhancement. Cystic changes are seen. A focal hypodense lesion is again identified stable in appearance from the prior study. Bladder is decompressed. Stomach/Bowel: No obstructive or inflammatory changes of the colon are noted. The appendix is within normal limits. Mild diverticular change of the colon is noted. Stomach and small bowel are within normal limits. Vascular/Lymphatic: Aortic atherosclerosis. 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 This measures approximately 2.9 cm in greatest dimension. Preaortic node is noted on image number 26 of series 2 similar to that noted on prior PET-CT. Prominent right iliac node is noted on image number 56 of series 2 corresponding the prior PET-CT. Similar enhancing lesion is noted adjacent to the tip of the liver also stable from the prior PET-CT. Reproductive: Prostate is  unremarkable. Other: Small fluid containing right inguinal hernia is noted new from the prior study. Small umbilical hernia is identified containing enhancing tissue consistent with the known metastatic lesions. no significant free fluid is noted. Musculoskeletal: Degenerative changes of lumbar spine are noted. No acute bony abnormality is seen. 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 Metastatic lesions are noted herniating into a small umbilical hernia. Fluid containing right inguinal hernia. Status post right nephrectomy. Cholelithiasis without choledocholithiasis. Prominence of the common bile duct and cystic duct are seen. Electronically Signed   By: Alcide Clever M.D.   On: 09/20/2023 19:17   DG Chest Port 1 View Result Date: 09/20/2023 CLINICAL DATA:  Shortness of breath. EXAM: PORTABLE CHEST 1 VIEW COMPARISON:  PET-CT dated 08/08/2023 FINDINGS: Normal sized heart. Tortuous and partially calcified thoracic aorta. Clear lungs with normal  vascularity. Diffuse osteopenia and mild thoracic spine degenerative changes. IMPRESSION: No acute abnormality. Electronically Signed   By: Beckie Salts M.D.   On: 09/20/2023 16:50    Catarina Hartshorn, DO  [facility]  If 7PM-7AM, please contact night-coverage [email or web address] Password [email or web address] 10/07/2023, 4:59 PM   LOS: 4 days

## 2023-10-08 DIAGNOSIS — C7B00 Secondary carcinoid tumors, unspecified site: Secondary | ICD-10-CM | POA: Diagnosis not present

## 2023-10-08 DIAGNOSIS — Z515 Encounter for palliative care: Secondary | ICD-10-CM | POA: Diagnosis not present

## 2023-10-08 DIAGNOSIS — N186 End stage renal disease: Secondary | ICD-10-CM | POA: Diagnosis not present

## 2023-10-08 DIAGNOSIS — I9589 Other hypotension: Secondary | ICD-10-CM | POA: Diagnosis not present

## 2023-10-08 DIAGNOSIS — Z992 Dependence on renal dialysis: Secondary | ICD-10-CM | POA: Diagnosis not present

## 2023-10-08 DIAGNOSIS — E872 Acidosis, unspecified: Secondary | ICD-10-CM | POA: Diagnosis not present

## 2023-10-08 LAB — CULTURE, BLOOD (ROUTINE X 2)
Culture: NO GROWTH
Culture: NO GROWTH

## 2023-10-08 MED ORDER — ORAL CARE MOUTH RINSE: 15.0000 mL | OROMUCOSAL | Status: DC | PRN

## 2023-10-08 MED ORDER — DIPHENOXYLATE-ATROPINE 2.5-0.025 MG PO TABS: 1.0000 | ORAL_TABLET | Freq: Four times a day (QID) | ORAL | Status: DC | PRN

## 2023-10-08 NOTE — TOC Transition Note (Signed)
 Transition of Care [facility]) - Discharge Note   Patient Details  Name: Allen Davenport MRN: [id number] Date of Birth: [birth year]  Transition of Care [facility]) CM/SW Contact:  Karn Cassis, LCSW Phone Number: 10/08/2023, 10:16 AM   Clinical Narrative: Per Lelon Mast with [facility], hospital bed has been ordered with [facility]. Pt's wife reports [facility] has contacted her to schedule delivery today. She would like pt to be home prior to delivery. Wife will notify RN when ready to call EMS. Samantha with [facility] aware of d/c today.       Final next level of care: Home w Hospice Care Barriers to Discharge: Barriers Resolved   Patient Goals and CMS Choice Patient states their goals for this hospitalization and ongoing recovery are:: return home          Discharge Placement                  Name of family member notified: wife Patient and family notified of of transfer: 10/08/23  Discharge Plan and Services Additional resources added to the After Visit Summary for   In-house Referral: Clinical Social Work              DME Arranged: Hospital bed DME Agency: [facility] Date DME Agency Contacted: 10/08/23 Time DME Agency Contacted: [id number] Representative spoke with at DME Agency: Lelon Mast [facility])            Social Drivers of Health (SDOH) Interventions SDOH Screenings   Food Insecurity: No Food Insecurity (10/03/2023)  Housing: Low Risk  (10/03/2023)  Transportation Needs: No Transportation Needs (10/03/2023)  Utilities: Not At Risk (10/03/2023)  Alcohol Screen: Low Risk  (05/24/2020)  Depression (PHQ2-9): Low Risk  (06/04/2019)  Financial Resource Strain: Low Risk  (05/24/2020)  Physical Activity: Insufficiently Active (05/24/2020)  Social Connections: Socially Integrated (10/07/2023)  Stress: No Stress Concern Present (05/24/2020)  Tobacco Use: Low Risk  (10/03/2023)     Readmission Risk Interventions    10/04/2023   12:43 PM 10/03/2023     7:27 PM  Readmission Risk Prevention Plan  Transportation Screening  Complete  PCP or Specialist Appt within 5-7 Days  Complete  Home Care Screening  Complete  Medication Review (RN CM)  Complete  HRI or Home Care Consult Complete   Social Work Consult for Recovery Care Planning/Counseling Complete   Palliative Care Screening Complete   Medication Review Oceanographer) Complete

## 2023-10-08 NOTE — Progress Notes (Signed)
 Palliative: Face-to-face conference with bedside nursing staff and transition of care team related to patient condition, needs, disposition.    Mr. Kimbrell is sitting up in the bed in his room.  He appears acutely/chronically ill and very frail.  He was transition to comfort care over the weekend and it is anticipated that he will discharge home with the benefits of [facility] hospice services today.  Mr. Kelley greets me, making and somewhat keeping eye contact.  He is alert and oriented, able to make his needs known.  There is no family at bedside at this time.  We talked about the plan for discharge home today with hospice care.  Mr. Ransom states that he is ready to return to his home.  No questions at this time.  Food and drink offered.  Denies symptom management needs at this time.  Plan: Home with the benefits of [facility] hospice for end-of-life care.  No further hemodialysis. Prognosis: Less than 2 weeks anticipated.  DNR/goldenrod form completed and placed on chart.  50 minutes  Lillia Carmel, NP Palliative medicine team Team phone [phone]

## 2023-10-08 NOTE — Progress Notes (Signed)
 Chart reviewed, patient has been transitioned to comfort care. Will sign off. Thank you for involving [place] in the care of this patient. Please call with any questions/concerns.  Anthony Sar, MD [facility]

## 2023-10-08 NOTE — TOC Progression Note (Signed)
 Transition of Care [facility]) - Progression Note    Patient Details  Name: Allen Davenport MRN: [id number] Date of Birth: October 22, 1940  Transition [facility]) CM/SW Contact  Karn Cassis, [place] Phone Number: 10/08/2023, 8:07 AM  Clinical Narrative:  [facility] notified pt will need hospital bed. Lelon Mast with [facility] reports she will request [facility] deliver today.     Expected Discharge Plan: Home/Self Care Barriers to Discharge: Continued Medical Work up  Expected Discharge Plan and Services In-house Referral: Clinical Social Work     Living arrangements for the past 2 months: Single Family Home                                       Social Determinants of Health (SDOH) Interventions SDOH Screenings   Food Insecurity: No Food Insecurity (10/03/2023)  Housing: Low Risk  (10/03/2023)  Transportation Needs: No Transportation Needs (10/03/2023)  Utilities: Not At Risk (10/03/2023)  Alcohol Screen: Low Risk  (05/24/2020)  Depression (PHQ2-9): Low Risk  (06/04/2019)  Financial Resource Strain: Low Risk  (05/24/2020)  Physical Activity: Insufficiently Active (05/24/2020)  Social Connections: Socially Integrated (10/07/2023)  Stress: No Stress Concern Present (05/24/2020)  Tobacco Use: Low Risk  (10/03/2023)    Readmission Risk Interventions    10/04/2023   12:43 PM 10/03/2023    7:27 PM  Readmission Risk Prevention Plan  Transportation Screening  Complete  PCP or Specialist Appt within 5-7 Days  Complete  Home Care Screening  Complete  Medication Review (RN CM)  Complete  HRI or Home Care Consult Complete   Social Work Consult for Recovery Care Planning/Counseling Complete   Palliative Care Screening Complete   Medication Review Oceanographer) Complete

## 2023-10-08 NOTE — Care Management Important Message (Signed)
 Important Message  Patient Details  Name: Allen Davenport MRN: [id number] Date of Birth: 05/27/1941   Important Message Given:  Yes - Medicare IM (spoke with spouse [name] at [phone] to review letter)     [name] 10/08/2023, 11:28 AM

## 2023-10-08 NOTE — Discharge Summary (Signed)
 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  The patient spouse is at the bedside to supplement the history.  She states that the patient has had a gradual functional decline over the past 3 to 4 months.  She states that he has lost 100 pounds in the past 6 months.  The patient frequently misses dialysis because of generalized weakness and just not wanting to go.  His last dialysis session was on 09/29/2023.  She states that the patient only had 1 dialysis session all last week prior to this admission.  She states that his dialysis sessions are sometimes cut off short because of his low blood pressure. Apparently the patient lost his balance and fell onto the floor on the morning of 10/03/2023.  He had difficulty getting up.  As result, the patient was brought to emergency department for further evaluation.  The patient himself denies any chest pain, shortness breath, abdominal pain, headache, neck pain.  He denies any fevers or chills.  He states that he has chronic loose stools.  On the average, the patient has 2-3 loose bowel movements on a daily basis.  He states that he has been having dry heaves on a daily basis at least for the past 2 to 3 months.  His appetite has been poor.  He denies any new medications.  He is having difficulty keeping his medications down at home. The patient states that he occasionally has some hematochezia with his bowel movements.  He denies any melena or hematemesis. In the ED, the patient was hypothermic initially with a temperature of 94.7 F.  He was hypotensive  with SBP in the 70s initially.  The patient was given 1 L of fluids with some improvement.  WBC 7.8, hemoglobin 9.9, platelets 220.  Sodium 139, potassium 3.9, bicarbonate 20, serum creatinine 7.05.  AST 43, ALT 12, alk phosphatase 26, total bilirubin 2.0. Chest x-ray was negative for any infiltrates.  Lactic acid 7.5.  EKG showed sinus tachycardia. The patient was fluid resuscitated and started on midodrine with some improvement in his blood pressure.  However, the patient still had difficulty tolerating dialysis secondary to hypotension. The case was discussed with nephrology who felt that the patient is a poor candidate for further dialysis in the setting of his metastatic cancer and poor tolerance of dialysis at this point. During the hospitalization, CT of the abdomen and pelvis continue to showed peritoneal carcinomatosis and it also showed choledocholithiasis.  MRCP was obtained and also suggested choledocholithiasis. The patient developed rectal bleeding during hospitalization.  GI was consulted.  Given the patient's poor baseline functioning, frailty, metastatic cancer, and seriousness of the acute medical conditions goals of care discussions were held with the patient's spouse and patient.  Ultimately, it was determined in the patient's best interest to change his focus of care to focus on comfort measures. After further discussion, spouse wanted to take patient home with hospcie care.  TOC helped with transition home.   Assessment and Plan:   Generalized weakness -Multifactorial including volume depletion, progression of his malignancy, and possible infectious process -Patient is anuric  so urine sample unable to be collected -continue empiric vancomycin and cefepime -B12--782 -TSH--8.445 -Folic acid--4.6 -10/05/23--after GOC discussion, patient's focus of care transitioned to comfort measures.   Hypotension/Hypothermia -Likely related to volume depletion although concerned about  underlying infectious process -Started empiric Antibiotics -midodrine started -Check PCT 0.22 -Continue judicious IV fluids -am cortisol--20.9 -CT AP--increase central necrosis with hepatic mets; mild intrahepatic and extrahepatic ductal dilatation with filling defects in CBD; omental and mesenteric nodularity; rectal wall thickening -3/19 blood cultures neg -10/05/23--after GOC discussion, patient's focus of care transitioned to comfort measures.   Dilated biliary ducts/Choledocholithiasis -concerned about choledocholithiasis on CT -3/20 MRCP--Widespread hepatic metastatic disease; Mild intra and extrahepatic biliary dilatation. The small partially calcified stones in the distal common bile duct on recent CT are not well seen on this examination due to motion artifact, although are grossly unchanged; peritoneal carcinomatosis -10/05/23--after GOC discussion, patient's focus of care transitioned to comfort measures.   Diarrhea -Related to the patient's carcinoid tumor -Check C. Difficile--neg -Stool pathogen panel--pending -prn lomotil   Intractable vomiting -Started around-the-clock Compazine -Initially started pantoprazole -improving -10/05/23--after GOC discussion, patient's focus of care transitioned to comfort measures.   Lactic acidosis -Suspect decreased clearance in the setting of ESRD -Empiric antibiotics as discussed above -Judicious IV fluids -Repeat lactic acid 7.5>>1.4 -Check VBG 7.27/40/<31/18   Metastatic carcinoid tumor to the peritoneum and liver -No longer a candidate for further therapy   ESRD -Nephrology consult for maintenance dialysis -Normally dialyzes TTS in [place] -Last dialysis 09/29/2023--patient dialyzed 2 hours -nephrology consulted for HD--spoke with Dr. Romelle Starcher -pt is not tolerating HD due to hypotension -10/05/23--after GOC discussion, patient's focus of care transitioned to comfort measures.   Rectal bleeding -GI consult appreicated -spouse  does not want any further invasive procedures   Goals of Care -discussed GOC daily with spouse -we discussed pt's overall poor prognosis in the setting of his progressive metastatic cancer, frailty, poor functional baseline and multiple acute medical issues including but not limited to his GI bleed, choledocholithiasis and intolerance of dialysis -She expressed that pt has stated multiple times that patient that he is "tired and ready to be done with it" -She and patient do wish any further pain and suffering and wish to transition focus of care to focus of full comfort. -3/22--wife expressed that she wishes to take him home with hospice and requested hospital bed Consultants: renal, palliative Procedures performed: none  Disposition: Home Diet recommendation:  Renal diet DISCHARGE MEDICATION: Allergies as of 10/08/2023   No Known Allergies      Medication List     STOP taking these medications    amLODipine 2.5 MG tablet Commonly known as: NORVASC   B-D TB SYRINGE 1CC/27GX1/2" 27G X 1/2" 1 ML Misc Generic drug: TUBERCULIN SYR 1CC/27GX1/2"   B2 100 MG Tabs   CALCIUM 600 + D PO   fexofenadine 180 MG tablet Commonly known as: ALLEGRA   furosemide 40 MG tablet Commonly known as: LASIX   sodium bicarbonate 650 MG tablet       TAKE these medications    diphenoxylate-atropine 2.5-0.025 MG tablet Commonly known as: LOMOTIL Take 2 tablets by mouth 4 (four) times daily as needed for diarrhea or loose stools.   mirtazapine 7.5 MG tablet Commonly known as: REMERON Take 7.5 mg by mouth at bedtime.   ondansetron 4 MG tablet Commonly known as: ZOFRAN Take 4 mg by mouth every 8 (eight) hours as needed.  Durable Medical Equipment  (From admission, onward)           Start     Ordered   10/08/23 0832  For home use only DME Hospital bed  Once       Question Answer Comment  Length of Need 6 Months   Patient has (list medical condition): metastatic  cancer   The above medical condition requires: Patient requires the ability to reposition frequently   Bed type Semi-electric   Support Surface: Gel Overlay      10/08/23 0831            Discharge Exam: Filed Weights   10/03/23 2015 10/04/23 1900 10/04/23 2215  Weight: 59.6 kg 60.2 kg 59.9 kg   HEENT:  [place]/AT, No thrush, no icterus CV:  RRR, no rub, no S3, no S4 Lung:  bibasilar rales.  No wheeze Abd:  soft/+BS, NT Ext:  2 + LE edema, no lymphangitis, no synovitis, no rash   Condition at discharge: stable  The results of significant diagnostics from this hospitalization (including imaging, microbiology, ancillary and laboratory) are listed below for reference.   Imaging Studies: MR ABDOMEN MRCP WO CONTRAST Result Date: 10/04/2023 CLINICAL DATA:  Cholelithiasis Metastatic carcinoid tumor. EXAM: MRI ABDOMEN WITHOUT CONTRAST  (INCLUDING MRCP) TECHNIQUE: Multiplanar multisequence MR imaging of the abdomen was performed. Heavily T2-weighted images of the biliary and pancreatic ducts were obtained, and three-dimensional MRCP images were rendered by post processing. COMPARISON:  Abdominopelvic CT 10/03/2023 and 09/20/2023 FINDINGS: Technical note: Despite efforts by the technologist and patient, mild motion artifact is present on today's exam and could not be eliminated. This reduces exam sensitivity and specificity. Lower chest:  Small left pleural effusion, unchanged from recent CT. Hepatobiliary: Widespread hepatic metastatic disease again noted, unchanged from recent CT. Largest lesion centrally in the right and left lobes measures up to 9.1 x 6.3 cm on image 14/4. These lesions demonstrate heterogeneous T1 and T2 hyperintensity. No contrast was administered. 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 Small gallstones with mild gallbladder distention. No gallbladder wall thickening or surrounding inflammation. Mild intra and extrahepatic biliary dilatation. The common  hepatic duct measures up to 10 mm in diameter. The small partially calcified stones in the distal common bile duct on recent CT are not well seen on this examination due to motion artifact, although are grossly unchanged. Pancreas: Mild atrophy. No focal abnormality, surrounding inflammation or ductal dilatation. Spleen: Normal in size without focal abnormality. 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 Adrenals/Urinary Tract: The left adrenal gland appears normal. The right adrenal gland and right kidney are not demonstrated. Left renal cortical thinning with scattered cyst formation. No hydronephrosis. Stomach/Bowel: The stomach appears unremarkable for its degree of distension. No evidence of bowel wall thickening, distention or surrounding inflammatory change. Vascular/Lymphatic: There are no enlarged abdominal lymph nodes. No acute vascular findings. Other: Generalized soft tissue edema. 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 Musculoskeletal: No acute or significant osseous findings. No evidence of osseous metastatic disease. L5-S1 interbody ankylosis. IMPRESSION: 1. Widespread hepatic metastatic disease, unchanged from recent CT. 2. Cholelithiasis with mild gallbladder distention. No evidence of acute cholecystitis. 3. Mild intra and extrahepatic biliary dilatation. The small partially calcified stones in the distal common bile duct on recent CT are not well seen on this examination due to motion artifact, although are grossly unchanged. 4. 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 5. Small left  pleural effusion, unchanged from recent CT. 6. 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 Electronically Signed   By: Carey Bullocks M.D.   On: 10/04/2023 20:36   CT ABDOMEN PELVIS W CONTRAST Result Date: 10/03/2023 CLINICAL DATA:  Metastatic carcinoid tumor  weakness. Mechanical fall. Abdominal pain. Sepsis. * Tracking Code: BO * EXAM: CT ABDOMEN AND PELVIS WITH CONTRAST TECHNIQUE: Multidetector CT imaging of the abdomen and pelvis was performed using the standard protocol following bolus administration of intravenous contrast. RADIATION DOSE REDUCTION: This exam was performed according to the departmental dose-optimization program which includes automated exposure control, adjustment of the mA and/or kV according to patient size and/or use of iterative reconstruction technique. CONTRAST:  OMNIPAQUE IOHEXOL 300 MG/ML  SOLN COMPARISON:  09/20/2023 FINDINGS: Lower chest: Small left pleural effusion. Hepatobiliary: Increased central necrosis and hypodensity associated with scattered metastatic lesions throughout the liver. This includes varicoid lucency in a 9.4 cm left hepatic lobe metastatic lesion. Mild intrahepatic biliary dilatation along with extrahepatic biliary dilatation extending to a cluster of spell to distal CBD filling defects each measuring about 0.4 cm in diameter shown for example on image 37 series 4 and image 29 series 2, suspicious for choledocholithiasis. Mildly distended gallbladder. Pancreas: Unremarkable Spleen: Unremarkable Adrenals/Urinary Tract: Absent right kidney. Small left kidney unchanged in appearance from the recent prior exam. Wall thickening along the urinary bladder, cystitis not excluded although some of this may be from nondistention. No hydronephrosis or hydroureter. Stomach/Bowel: Contrast medium in stool in the mildly distended rectum, with substantial indistinctly marginated rectal wall thickening. Cannot exclude stercoral colitis or proctitis. This has worsened compared to previous. No dilated bowel. Vascular/Lymphatic: Minimal iliac artery atheromatous vascular calcification. Reproductive: Fiducials along the posterior margin of the prostate gland. Other: Omental and mesenteric nodularity compatible with tumor. This is  most confluent in the periumbilical region or bilobed tumor is present within and underlying the umbilicus measuring 2.7 by 2.6 cm on image 43 series 2, stable in appearance. There is tumor with accentuated neovascularity along the right paracolic gutter just below the right hepatic lobe. Subcutaneous and mesenteric edema compatible with progressive third spacing of fluids. Musculoskeletal: Old healed left pelvic fractures. Lower lumbar spondylosis and degenerative disc disease with fusion between the L5 and S1 vertebral bodies. IMPRESSION: 1. Increased central necrosis and hypodensity associated with scattered metastatic lesions throughout the liver. 2. Mild intrahepatic and extrahepatic biliary dilatation extending to a cluster of two filling defects in the distal CBD each measuring about 0.4 cm in diameter, compatible with choledocholithiasis. 3. Omental and mesenteric nodularity compatible with tumor, similar to previous. 4. Contrast medium and stool in the mildly distended rectum, with substantial indistinctly marginated rectal wall thickening. Cannot exclude stercoral colitis or proctitis. This has worsened compared to previous. 5. Wall thickening along the urinary bladder, cystitis not excluded although some of this may be from nondistention. 6. Small left pleural effusion. 7. Subcutaneous and mesenteric edema compatible with progressive third spacing of fluids. 8. Old healed left pelvic fractures. Electronically Signed   By: Gaylyn Rong M.D.   On: 10/03/2023 18:26   DG Chest Portable 1 View Result Date: 10/03/2023 CLINICAL DATA:  Altered mental status. EXAM: PORTABLE CHEST 1 VIEW COMPARISON:  09/20/2023. FINDINGS: Bilateral lung fields are clear. Bilateral costophrenic angles are clear. Normal cardio-mediastinal silhouette. No acute osseous abnormalities. The soft tissues are within normal limits. IMPRESSION: No active disease. Electronically Signed   By: Jules Schick M.D.   On: 10/03/2023  14:15    CT ABDOMEN PELVIS W CONTRAST Result Date: 09/20/2023 CLINICAL DATA:  Acute abdominal pain and recent missed dialysis sessions, initial encounter EXAM: CT ABDOMEN AND PELVIS WITH CONTRAST TECHNIQUE: Multidetector CT imaging of the abdomen and pelvis was performed using the standard protocol following bolus administration of intravenous contrast. RADIATION DOSE REDUCTION: This exam was performed according to the departmental dose-optimization program which includes automated exposure control, adjustment of the mA and/or kV according to patient size and/or use of iterative reconstruction technique. CONTRAST:  OMNIPAQUE IOHEXOL 300 MG/ML  SOLN COMPARISON:  08/23/2023 FINDINGS: Lower chest: No acute abnormality. Hepatobiliary: Liver again demonstrates peripherally enhancing metastatic lesions which appear roughly similar to that seen on the prior exam but better visualized due to the contrast enhancement. The gallbladder is well distended. Single dependent gallstone is seen. Cystic duct and common bile duct are dilated no definitive choledocholithiasis is seen. Pancreas: Mild dilatation of the pancreatic duct is noted. No other focal pancreas abnormality is noted. Spleen: Normal in size without focal abnormality. Adrenals/Urinary Tract: Adrenal glands are within normal limits. Changes of prior right nephrectomy are again noted. Visualized left kidney shows normal enhancement. Cystic changes are seen. A focal hypodense lesion is again identified stable in appearance from the prior study. Bladder is decompressed. Stomach/Bowel: No obstructive or inflammatory changes of the colon are noted. The appendix is within normal limits. Mild diverticular change of the colon is noted. Stomach and small bowel are within normal limits. Vascular/Lymphatic: Aortic atherosclerosis. 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 This measures approximately 2.9 cm in  greatest dimension. Preaortic node is noted on image number 26 of series 2 similar to that noted on prior PET-CT. Prominent right iliac node is noted on image number 56 of series 2 corresponding the prior PET-CT. Similar enhancing lesion is noted adjacent to the tip of the liver also stable from the prior PET-CT. Reproductive: Prostate is unremarkable. Other: Small fluid containing right inguinal hernia is noted new from the prior study. Small umbilical hernia is identified containing enhancing tissue consistent with the known metastatic lesions. no significant free fluid is noted. Musculoskeletal: Degenerative changes of lumbar spine are noted. No acute bony abnormality is seen. 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 Metastatic lesions are noted herniating into a small umbilical hernia. Fluid containing right inguinal hernia. Status post right nephrectomy. Cholelithiasis without choledocholithiasis. Prominence of the common bile duct and cystic duct are seen. Electronically Signed   By: Alcide Clever M.D.   On: 09/20/2023 19:17   DG Chest Port 1 View Result Date: 09/20/2023 CLINICAL DATA:  Shortness of breath. EXAM: PORTABLE CHEST 1 VIEW COMPARISON:  PET-CT dated 08/08/2023 FINDINGS: Normal sized heart. Tortuous and partially calcified thoracic aorta. Clear lungs with normal vascularity. Diffuse osteopenia and mild thoracic spine degenerative changes. IMPRESSION: No acute abnormality. Electronically Signed   By: Beckie Salts M.D.   On: 09/20/2023 16:50    Microbiology: Results for orders placed or performed during the hospital encounter of 10/03/23  Culture, blood (routine x 2)     Status: None   Collection Time: 10/03/23 12:16 PM   Specimen: BLOOD  Result Value Ref Range Status   Specimen Description BLOOD BLOOD RIGHT ARM  Final   Special  Requests   Final    BOTTLES DRAWN AEROBIC  AND ANAEROBIC Blood Culture results may not be optimal due to an inadequate volume of blood received in culture bottles   Culture   Final    NO GROWTH 5 DAYS Performed at [facility], [address] [place], [place] [address]    Report Status 10/08/2023 FINAL  Final  Culture, blood (routine x 2)     Status: None   Collection Time: 10/03/23 12:16 PM   Specimen: BLOOD  Result Value Ref Range Status   Specimen Description BLOOD RFOA  Final   Special Requests   Final    Blood Culture results may not be optimal due to an inadequate volume of blood received in culture bottles BOTTLES DRAWN AEROBIC ONLY   Culture   Final    NO GROWTH 5 DAYS Performed at [facility], [address] [place], [place] [address]    Report Status 10/08/2023 FINAL  Final  Resp panel by RT-PCR (RSV, Flu A&B, Covid) Anterior Nasal Swab     Status: None   Collection Time: 10/03/23  7:33 PM   Specimen: Anterior Nasal Swab  Result Value Ref Range Status   SARS Coronavirus 2 by RT PCR NEGATIVE NEGATIVE Final    Comment: (NOTE) SARS-CoV-2 target nucleic acids are NOT DETECTED.  The SARS-CoV-2 RNA is generally detectable in upper respiratory specimens during the acute phase of infection. The lowest concentration of SARS-CoV-2 viral copies this assay can detect is 138 copies/mL. A negative result does not preclude SARS-Cov-2 infection and should not be used as the sole basis for treatment or other patient management decisions. A negative result may occur with  improper specimen collection/handling, submission of specimen other than nasopharyngeal swab, presence of viral mutation(s) within the areas targeted by this assay, and inadequate number of viral copies(<138 copies/mL). A negative result must be combined with clinical observations, patient history, and epidemiological information. The expected result is Negative.  Fact Sheet for Patients:   BloggerCourse.com  Fact Sheet for Healthcare Providers:  SeriousBroker.it  This test is no t yet approved or cleared by the [place] FDA and  has been authorized for detection and/or diagnosis of SARS-CoV-2 by FDA under an Emergency Use Authorization (EUA). This EUA will remain  in effect (meaning this test can be used) for the duration of the COVID-19 declaration under Section 564(b)(1) of the Act, 21 U.S.C.section 360bbb-3(b)(1), unless the authorization is terminated  or revoked sooner.       Influenza A by PCR NEGATIVE NEGATIVE Final   Influenza B by PCR NEGATIVE NEGATIVE Final    Comment: (NOTE) The Xpert Xpress SARS-CoV-2/FLU/RSV plus assay is intended as an aid in the diagnosis of influenza from Nasopharyngeal swab specimens and should not be used as a sole basis for treatment. Nasal washings and aspirates are unacceptable for Xpert Xpress SARS-CoV-2/FLU/RSV testing.  Fact Sheet for Patients: BloggerCourse.com  Fact Sheet for Healthcare Providers: SeriousBroker.it  This test is not yet approved or cleared by the [place] FDA and has been authorized for detection and/or diagnosis of SARS-CoV-2 by FDA under an Emergency Use Authorization (EUA). This EUA will remain in effect (meaning this test can be used) for the duration of the COVID-19 declaration under Section 564(b)(1) of the Act, 21 U.S.C. section 360bbb-3(b)(1), unless the authorization is terminated or revoked.     Resp Syncytial Virus by PCR NEGATIVE NEGATIVE Final    Comment: (NOTE) Fact Sheet for Patients: BloggerCourse.com  Fact Sheet for Healthcare Providers: SeriousBroker.it  This test is not yet approved or  cleared by the [place] and has been authorized for detection and/or diagnosis of SARS-CoV-2 by FDA under an Emergency Use  Authorization (EUA). This EUA will remain in effect (meaning this test can be used) for the duration of the COVID-19 declaration under Section 564(b)(1) of the Act, 21 U.S.C. section 360bbb-3(b)(1), unless the authorization is terminated or revoked.  Performed at [facility], [address], [place], [place] [address]   C Difficile Quick Screen w PCR reflex     Status: None   Collection Time: 10/03/23  7:57 PM   Specimen: Stool  Result Value Ref Range Status   C Diff antigen NEGATIVE NEGATIVE Final   C Diff toxin NEGATIVE NEGATIVE Final   C Diff interpretation No C. difficile detected.  Final    Comment: Performed at [facility], [address], [place], [place] [address]  MRSA Next Gen by PCR, Nasal     Status: None   Collection Time: 10/03/23  8:27 PM   Specimen: Nasal Mucosa; Nasal Swab  Result Value Ref Range Status   MRSA by PCR Next Gen NOT DETECTED NOT DETECTED Final    Comment: (NOTE) The GeneXpert MRSA Assay (FDA approved for NASAL specimens only), is one component of a comprehensive MRSA colonization surveillance program. It is not intended to diagnose MRSA infection nor to guide or monitor treatment for MRSA infections. Test performance is not FDA approved in patients less than 56 years old. Performed at [facility], [address] [place], [place] [address]   Gastrointestinal Panel by PCR , Stool     Status: Abnormal   Collection Time: 10/05/23 11:42 AM   Specimen: Stool  Result Value Ref Range Status   Campylobacter species NOT DETECTED NOT DETECTED Final   Plesimonas shigelloides NOT DETECTED NOT DETECTED Final   Salmonella species NOT DETECTED NOT DETECTED Final   Yersinia enterocolitica NOT DETECTED NOT DETECTED Final   Vibrio species NOT DETECTED NOT DETECTED Final   Vibrio cholerae NOT DETECTED NOT DETECTED Final   Enteroaggregative E coli (EAEC) NOT DETECTED NOT DETECTED Final   Enteropathogenic E coli (EPEC) NOT DETECTED NOT DETECTED Final    Enterotoxigenic E coli (ETEC) NOT DETECTED NOT DETECTED Final   Shiga like toxin producing E coli (STEC) NOT DETECTED NOT DETECTED Final   Shigella/Enteroinvasive E coli (EIEC) NOT DETECTED NOT DETECTED Final   Cryptosporidium NOT DETECTED NOT DETECTED Final   Cyclospora cayetanensis NOT DETECTED NOT DETECTED Final   Entamoeba histolytica NOT DETECTED NOT DETECTED Final   Giardia lamblia NOT DETECTED NOT DETECTED Final   Adenovirus F40/41 NOT DETECTED NOT DETECTED Final   Astrovirus NOT DETECTED NOT DETECTED Final   Norovirus GI/GII DETECTED (A) NOT DETECTED Final    Comment: CRITICAL RESULT CALLED TO, READ BACK BY AND VERIFIED WITH: Shaune Leeks RN @ 0020 10/06/23 [facility]    Rotavirus A NOT DETECTED NOT DETECTED Final   Sapovirus (I, II, IV, and V) NOT DETECTED NOT DETECTED Final    Comment: Performed at [facility], [address] [place], [place] [address]    Labs: CBC: Recent Labs  Lab 10/03/23 1216 10/04/23 0509 10/05/23 0513  WBC 7.8 8.5 8.0  NEUTROABS 7.3  --   --   HGB 11.9* 9.7* 8.7*  HCT 38.5* 31.2* 27.2*  MCV 97.0 95.1 93.2  PLT 220 140* 97*   Basic Metabolic Panel: Recent Labs  Lab 10/03/23 1216 10/04/23 0505 10/05/23 0513  NA 139 139 137  K 3.9 3.8 3.7  CL 94* 99 98  CO2 20* 24 26  GLUCOSE 108* 79 79  BUN 36* 43* 29*  CREATININE 7.05* 7.43* 4.77*  CALCIUM 9.1 8.2* 7.6*  PHOS  --  4.0  --    Liver Function Tests: Recent Labs  Lab 10/03/23 1216 10/04/23 0505 10/04/23 0509 10/05/23 0513  AST 43*  --  59* 38  ALT 12  --  12 10  ALKPHOS 76  --  58 53  BILITOT 2.0*  --  1.5* 1.4*  PROT 6.4*  --  5.1* 4.9*  ALBUMIN 3.2* 2.6* 2.6* 2.6*   CBG: No results for input(s): "GLUCAP" in the last 168 hours.  Discharge time spent: greater than 30 minutes.  Signed: Catarina Hartshorn, MD [facility] 10/08/2023

## 2023-10-09 ENCOUNTER — Telehealth: Payer: Self-pay

## 2023-10-09 DIAGNOSIS — R197 Diarrhea, unspecified: Secondary | ICD-10-CM | POA: Diagnosis not present

## 2023-10-09 DIAGNOSIS — R1310 Dysphagia, unspecified: Secondary | ICD-10-CM | POA: Diagnosis not present

## 2023-10-09 DIAGNOSIS — Z85520 Personal history of malignant carcinoid tumor of kidney: Secondary | ICD-10-CM | POA: Diagnosis not present

## 2023-10-09 DIAGNOSIS — I509 Heart failure, unspecified: Secondary | ICD-10-CM | POA: Diagnosis not present

## 2023-10-09 DIAGNOSIS — K449 Diaphragmatic hernia without obstruction or gangrene: Secondary | ICD-10-CM | POA: Diagnosis not present

## 2023-10-09 DIAGNOSIS — E785 Hyperlipidemia, unspecified: Secondary | ICD-10-CM | POA: Diagnosis not present

## 2023-10-09 DIAGNOSIS — R630 Anorexia: Secondary | ICD-10-CM | POA: Diagnosis not present

## 2023-10-09 DIAGNOSIS — D509 Iron deficiency anemia, unspecified: Secondary | ICD-10-CM | POA: Diagnosis not present

## 2023-10-09 DIAGNOSIS — E872 Acidosis, unspecified: Secondary | ICD-10-CM | POA: Diagnosis not present

## 2023-10-09 DIAGNOSIS — Z905 Acquired absence of kidney: Secondary | ICD-10-CM | POA: Diagnosis not present

## 2023-10-09 DIAGNOSIS — N186 End stage renal disease: Secondary | ICD-10-CM | POA: Diagnosis not present

## 2023-10-09 DIAGNOSIS — Z8546 Personal history of malignant neoplasm of prostate: Secondary | ICD-10-CM | POA: Diagnosis not present

## 2023-10-09 DIAGNOSIS — I959 Hypotension, unspecified: Secondary | ICD-10-CM | POA: Diagnosis not present

## 2023-10-09 NOTE — Transitions of Care (Post Inpatient/ED Visit) (Signed)
 10/09/2023  Name: Allen Davenport MRN: [id number] DOB: 12-16-1940  Today's TOC FU Call Status: Today's TOC FU Call Status:: Successful TOC FU Call Completed TOC FU Call Complete Date: 10/09/23 Patient's Name and Date of Birth confirmed.  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    How have you been since you were released from the hospital?: Same Any questions or concerns?: No   Items Reviewed: Did you receive and understand the discharge instructions provided?: Yes Medications obtained,verified, and reconciled?: Yes (Medications Reviewed) Any new allergies since your discharge?: No Dietary orders reviewed?: Yes Type of Diet Ordered:: Pleasure meals Do you have support at home?: Yes People in Home: spouse, other relative(s), friend(s) Name of Support/Comfort Primary Source: Wife Corrie Dandy, strong friends and church support  Medications Reviewed Today: Medications Reviewed Today     Reviewed by Johnnette Barrios, RN (Registered Nurse) on 10/09/23 at [phone]  Med List Status: <None>   Medication Order Taking? Sig Documenting Provider Last Dose Status Informant  diphenoxylate-atropine (LOMOTIL) 2.5-0.025 MG tablet [id number] Yes Take 2 tablets by mouth 4 (four) times daily as needed for diarrhea or loose stools. Doreatha Massed, MD Taking Active Self, Spouse/Significant Other, Pharmacy Records, Other, Multiple Informants  mirtazapine (REMERON) 7.5 MG tablet [id number] Yes Take 7.5 mg by mouth at bedtime. [provider] Taking Active Self, Spouse/Significant Other,  Pharmacy Records, Other, Multiple Informants  ondansetron (ZOFRAN) 4 MG tablet [id number] Yes Take 4 mg by mouth every 8 (eight) hours as needed. [provider] Taking Active Self, Spouse/Significant Other, Pharmacy Records, Other, Multiple Informants          Medication reconciliation / review completed based on most recent discharge summary and EHR medication list. Confirmed patient is taking all newly prescribed medications as instructed (any discrepancies are noted in review section)   Patient / Caregiver is aware of any changes to and / or  any dosage adjustments to medication regimen. Patient/ Caregiver denies questions at this time and reports no barriers to medication adherence.   Medication changes and updates reviewed with spouse  STOP taking these medications     amLODipine 2.5 MG tablet Commonly known as: NORVASC    B-D TB SYRINGE 1CC/27GX1/2" 27G X 1/2" 1 ML Misc Generic drug: TUBERCULIN SYR 1CC/27GX1/2"    B2 100 MG Tabs    CALCIUM 600 + D PO    fexofenadine 180 MG tablet Commonly known as: ALLEGRA    furosemide 40 MG tablet Commonly known as: LASIX    sodium bicarbonate 650 MG tablet     TAKE these medications     diphenoxylate-atropine 2.5-0.025 MG tablet Commonly known as: LOMOTIL Take 2 tablets by mouth 4 (four) times daily as needed for diarrhea or loose stools.    mirtazapine 7.5 MG tablet Commonly known as: REMERON Take 7.5 mg by mouth at bedtime.    ondansetron 4 MG tablet Commonly known as: ZOFRAN Take 4 mg by mouth every 8 (eight) hours as needed.     Hospice Comfort Pack and standing orders anticipated at [facility] ( scheduled 10/09/23)   Home Care and Equipment/Supplies: Were Home Health Services Ordered?: Yes Given the patient's poor  baseline functioning, frailty, metastatic cancer, and seriousness of the acute medical conditions goals of care discussions were held with the patient's spouse and patient. Ultimately, it was determined in the  patient's best interest to change his focus of care to focus on comfort measures.   Name of Home Health Agency:: [facility] [phone] Has Agency set up a time to come to your home?: Yes First Home Health Visit Date: 10/09/23  Any new equipment or medical supplies ordered?: Yes Name of Medical supply agency?:  [facility] ,  Hospital bed Semi-electric  [employer]  anticipate additional  supplies [facility], [employer] [place], [place], incontinence supplies Were you able to get the equipment/medical supplies?: Yes Do you have any questions related to the use of the equipment/supplies?: No  Functional Questionnaire: Per spouse  the patient has had a gradual  decline over the past  several months. She stated that he has over  100 lbs. in the past 6 months.  He  frequently misses dialysis due to weakness and malaise  His last dialysis session was on 09/29/2023   Do you need assistance with bathing/showering or dressing?: yes Do you need assistance with meal preparation?: Yes Do you need assistance with eating?: No Do you have difficulty maintaining continence: Yes Do you need assistance with getting out of bed/getting out of a chair/moving?: Yes Do you have difficulty managing or taking your medications?: Yes (multiple medications discontinued)  Follow up appointments reviewed: PCP Follow-up appointment confirmed?: NA Specialist Hospital Follow-up appointment confirmed?: NA Do you need transportation to your follow-up appointment?: No Do you understand care options if your condition(s) worsen?: Yes-patient verbalized understanding  SDOH Interventions Today    Flowsheet Row Most Recent Value  SDOH Interventions   Food Insecurity Interventions Intervention Not Indicated  Housing Interventions Intervention Not Indicated  Transportation Interventions Intervention Not Indicated, Payor Benefit, Patient Resources (Friends/Family)  [Hospice care]  Utilities Interventions Intervention  Not Indicated      Interventions Today    Flowsheet Row Most Recent Value  Chronic Disease   Chronic disease during today's visit Chronic Kidney Disease/End Stage Renal Disease (ESRD)  General Interventions   General Interventions Discussed/Reviewed [facility], Level of Care  Level of Care --  [Hospice]  Exercise Interventions   Exercise Discussed/Reviewed Assistive device use and maintanence  Education Interventions   Education Provided Provided Education  Provided Verbal Education On Nutrition, Programmer, applications, Other  [employer] Respite [facility]]  Mental Health Interventions   Mental Health Discussed/Reviewed Coping Strategies, Grief and Loss  Nutrition Interventions   Nutrition Discussed/Reviewed Nutrition Discussed, Nutrition Reviewed, Supplemental nutrition  [Pleasure Meals]  Pharmacy Interventions   Pharmacy Dicussed/Reviewed Medications and their functions  [Potential start of Comfort pack]  Safety Interventions   Safety Discussed/Reviewed Fall Risk, Home Safety  Home Safety Assistive Devices  Advanced Directive Interventions   Advanced Directives Discussed/Reviewed Advanced Directives Discussed, Advanced Directives Reviewed, End of Life  End of Life Hospice        Benefits reviewed  Based on current information and Insurance plan -Reviewed benefits accessible to patient, including details about eligibility options for care and  available value based care options  if any areas of needs were identified.  Reviewed patient/  caregiver's ability to access and / or  ability with navigating the benefits system..Amb Referral made if indicted , refer to orders section of note for details   Reviewed goals for care Patient  and / or Caregiveverbalizes understanding of instructions and care plan provided. Patient /  Caregiver was encouraged to make informed decisions about their care, actively participate in managing their health condition, and implement lifestyle  changes as needed to promote independence and self-management of health care. There were no reported  barriers to care.   TOC program  Patient is at  risk for readmission and / or has history of  high utilization  Discussed VBCI  TOC program and weekly calls to patient to assess condition/status, medication management  and provide support/education as indicated . Patient  and / or Caregive voiced understanding and declined enrollment in the 30-day TOC Program at this time .  Discussed recent hospital admissions and current status   Per chart review     The Patient  and / or Caregiver  has been provided with contact information for the care management team and has been advised to call with any health-related questions or concerns. Patient was encouraged to Contact PCP with any questions or concerns regarding ongoing medical care, any difficulty obtaining or picking up prescriptions, any changes or worsening in condition including signs / symptoms not relieved  with interventions Patient had no additional questions or concerns at this time.      Susa Loffler , BSN, RN [facility]   VBCI-Population Health RN Care Manager Direct Dial [phone]  Fax: [phone] Website: [email or web address]

## 2023-10-10 DIAGNOSIS — Z85520 Personal history of malignant carcinoid tumor of kidney: Secondary | ICD-10-CM | POA: Diagnosis not present

## 2023-10-10 DIAGNOSIS — E872 Acidosis, unspecified: Secondary | ICD-10-CM | POA: Diagnosis not present

## 2023-10-10 DIAGNOSIS — I959 Hypotension, unspecified: Secondary | ICD-10-CM | POA: Diagnosis not present

## 2023-10-10 DIAGNOSIS — D509 Iron deficiency anemia, unspecified: Secondary | ICD-10-CM | POA: Diagnosis not present

## 2023-10-10 DIAGNOSIS — Z8546 Personal history of malignant neoplasm of prostate: Secondary | ICD-10-CM | POA: Diagnosis not present

## 2023-10-10 DIAGNOSIS — N186 End stage renal disease: Secondary | ICD-10-CM | POA: Diagnosis not present

## 2023-10-11 DIAGNOSIS — E872 Acidosis, unspecified: Secondary | ICD-10-CM | POA: Diagnosis not present

## 2023-10-11 DIAGNOSIS — D509 Iron deficiency anemia, unspecified: Secondary | ICD-10-CM | POA: Diagnosis not present

## 2023-10-11 DIAGNOSIS — Z85520 Personal history of malignant carcinoid tumor of kidney: Secondary | ICD-10-CM | POA: Diagnosis not present

## 2023-10-11 DIAGNOSIS — I959 Hypotension, unspecified: Secondary | ICD-10-CM | POA: Diagnosis not present

## 2023-10-11 DIAGNOSIS — Z8546 Personal history of malignant neoplasm of prostate: Secondary | ICD-10-CM | POA: Diagnosis not present

## 2023-10-11 DIAGNOSIS — N186 End stage renal disease: Secondary | ICD-10-CM | POA: Diagnosis not present

## 2023-10-12 DIAGNOSIS — N186 End stage renal disease: Secondary | ICD-10-CM | POA: Diagnosis not present

## 2023-10-12 DIAGNOSIS — Z8546 Personal history of malignant neoplasm of prostate: Secondary | ICD-10-CM | POA: Diagnosis not present

## 2023-10-12 DIAGNOSIS — I959 Hypotension, unspecified: Secondary | ICD-10-CM | POA: Diagnosis not present

## 2023-10-12 DIAGNOSIS — Z85520 Personal history of malignant carcinoid tumor of kidney: Secondary | ICD-10-CM | POA: Diagnosis not present

## 2023-10-12 DIAGNOSIS — D509 Iron deficiency anemia, unspecified: Secondary | ICD-10-CM | POA: Diagnosis not present

## 2023-10-12 DIAGNOSIS — E872 Acidosis, unspecified: Secondary | ICD-10-CM | POA: Diagnosis not present

## 2023-10-13 DIAGNOSIS — Z8546 Personal history of malignant neoplasm of prostate: Secondary | ICD-10-CM | POA: Diagnosis not present

## 2023-10-13 DIAGNOSIS — N186 End stage renal disease: Secondary | ICD-10-CM | POA: Diagnosis not present

## 2023-10-13 DIAGNOSIS — I959 Hypotension, unspecified: Secondary | ICD-10-CM | POA: Diagnosis not present

## 2023-10-13 DIAGNOSIS — E872 Acidosis, unspecified: Secondary | ICD-10-CM | POA: Diagnosis not present

## 2023-10-13 DIAGNOSIS — D509 Iron deficiency anemia, unspecified: Secondary | ICD-10-CM | POA: Diagnosis not present

## 2023-10-13 DIAGNOSIS — Z85520 Personal history of malignant carcinoid tumor of kidney: Secondary | ICD-10-CM | POA: Diagnosis not present

## 2023-10-14 DIAGNOSIS — E872 Acidosis, unspecified: Secondary | ICD-10-CM | POA: Diagnosis not present

## 2023-10-14 DIAGNOSIS — D509 Iron deficiency anemia, unspecified: Secondary | ICD-10-CM | POA: Diagnosis not present

## 2023-10-14 DIAGNOSIS — N186 End stage renal disease: Secondary | ICD-10-CM | POA: Diagnosis not present

## 2023-10-14 DIAGNOSIS — Z8546 Personal history of malignant neoplasm of prostate: Secondary | ICD-10-CM | POA: Diagnosis not present

## 2023-10-14 DIAGNOSIS — I959 Hypotension, unspecified: Secondary | ICD-10-CM | POA: Diagnosis not present

## 2023-10-14 DIAGNOSIS — Z85520 Personal history of malignant carcinoid tumor of kidney: Secondary | ICD-10-CM | POA: Diagnosis not present

## 2023-10-15 DIAGNOSIS — E872 Acidosis, unspecified: Secondary | ICD-10-CM | POA: Diagnosis not present

## 2023-10-15 DIAGNOSIS — Z85520 Personal history of malignant carcinoid tumor of kidney: Secondary | ICD-10-CM | POA: Diagnosis not present

## 2023-10-15 DIAGNOSIS — N186 End stage renal disease: Secondary | ICD-10-CM | POA: Diagnosis not present

## 2023-10-15 DIAGNOSIS — D509 Iron deficiency anemia, unspecified: Secondary | ICD-10-CM | POA: Diagnosis not present

## 2023-10-15 DIAGNOSIS — I959 Hypotension, unspecified: Secondary | ICD-10-CM | POA: Diagnosis not present

## 2023-10-15 DIAGNOSIS — Z8546 Personal history of malignant neoplasm of prostate: Secondary | ICD-10-CM | POA: Diagnosis not present

## 2023-10-16 DIAGNOSIS — K449 Diaphragmatic hernia without obstruction or gangrene: Secondary | ICD-10-CM | POA: Diagnosis not present

## 2023-10-16 DIAGNOSIS — R630 Anorexia: Secondary | ICD-10-CM | POA: Diagnosis not present

## 2023-10-16 DIAGNOSIS — I509 Heart failure, unspecified: Secondary | ICD-10-CM | POA: Diagnosis not present

## 2023-10-16 DIAGNOSIS — E872 Acidosis, unspecified: Secondary | ICD-10-CM | POA: Diagnosis not present

## 2023-10-16 DIAGNOSIS — R1310 Dysphagia, unspecified: Secondary | ICD-10-CM | POA: Diagnosis not present

## 2023-10-16 DIAGNOSIS — R197 Diarrhea, unspecified: Secondary | ICD-10-CM | POA: Diagnosis not present

## 2023-10-16 DIAGNOSIS — Z8546 Personal history of malignant neoplasm of prostate: Secondary | ICD-10-CM | POA: Diagnosis not present

## 2023-10-16 DIAGNOSIS — Z85520 Personal history of malignant carcinoid tumor of kidney: Secondary | ICD-10-CM | POA: Diagnosis not present

## 2023-10-16 DIAGNOSIS — N186 End stage renal disease: Secondary | ICD-10-CM | POA: Diagnosis not present

## 2023-10-16 DIAGNOSIS — E785 Hyperlipidemia, unspecified: Secondary | ICD-10-CM | POA: Diagnosis not present

## 2023-10-16 DIAGNOSIS — Z905 Acquired absence of kidney: Secondary | ICD-10-CM | POA: Diagnosis not present

## 2023-10-16 DIAGNOSIS — I959 Hypotension, unspecified: Secondary | ICD-10-CM | POA: Diagnosis not present

## 2023-10-16 DIAGNOSIS — D509 Iron deficiency anemia, unspecified: Secondary | ICD-10-CM | POA: Diagnosis not present

## 2023-10-17 DIAGNOSIS — D509 Iron deficiency anemia, unspecified: Secondary | ICD-10-CM | POA: Diagnosis not present

## 2023-10-17 DIAGNOSIS — I959 Hypotension, unspecified: Secondary | ICD-10-CM | POA: Diagnosis not present

## 2023-10-17 DIAGNOSIS — E872 Acidosis, unspecified: Secondary | ICD-10-CM | POA: Diagnosis not present

## 2023-10-17 DIAGNOSIS — N186 End stage renal disease: Secondary | ICD-10-CM | POA: Diagnosis not present

## 2023-10-17 DIAGNOSIS — Z85520 Personal history of malignant carcinoid tumor of kidney: Secondary | ICD-10-CM | POA: Diagnosis not present

## 2023-10-17 DIAGNOSIS — Z8546 Personal history of malignant neoplasm of prostate: Secondary | ICD-10-CM | POA: Diagnosis not present

## 2023-11-15 DEATH — deceased
